# Patient Record
Sex: Female | Born: 1980 | Race: Black or African American | Hispanic: No | Marital: Single | Smoking: Never smoker
Health system: Southern US, Community
[De-identification: ages and names within clinical notes are randomized; demographics above are authoritative.]

## PROBLEM LIST (undated history)

## (undated) ENCOUNTER — Inpatient Hospital Stay (HOSPITAL_COMMUNITY): Payer: Self-pay

## (undated) ENCOUNTER — Emergency Department (HOSPITAL_COMMUNITY): Admission: EM | Payer: Medicaid Other

## (undated) DIAGNOSIS — O210 Mild hyperemesis gravidarum: Secondary | ICD-10-CM

## (undated) DIAGNOSIS — N189 Chronic kidney disease, unspecified: Secondary | ICD-10-CM

## (undated) DIAGNOSIS — I10 Essential (primary) hypertension: Secondary | ICD-10-CM

## (undated) DIAGNOSIS — F32A Depression, unspecified: Secondary | ICD-10-CM

## (undated) DIAGNOSIS — F419 Anxiety disorder, unspecified: Secondary | ICD-10-CM

## (undated) DIAGNOSIS — F329 Major depressive disorder, single episode, unspecified: Secondary | ICD-10-CM

## (undated) DIAGNOSIS — F99 Mental disorder, not otherwise specified: Secondary | ICD-10-CM

## (undated) HISTORY — PX: NO PAST SURGERIES: SHX2092

## (undated) HISTORY — DX: Chronic kidney disease, unspecified: N18.9

---
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
## 1998-05-04 ENCOUNTER — Emergency Department (HOSPITAL_COMMUNITY): Admission: EM | Admit: 1998-05-04 | Discharge: 1998-05-04 | Payer: Self-pay | Admitting: Emergency Medicine

## 1998-05-04 ENCOUNTER — Encounter: Payer: Self-pay | Admitting: Emergency Medicine

## 1998-08-31 ENCOUNTER — Emergency Department (HOSPITAL_COMMUNITY): Admission: EM | Admit: 1998-08-31 | Discharge: 1998-08-31 | Payer: Self-pay | Admitting: Emergency Medicine

## 1998-12-25 ENCOUNTER — Emergency Department (HOSPITAL_COMMUNITY): Admission: EM | Admit: 1998-12-25 | Discharge: 1998-12-25 | Payer: Self-pay | Admitting: Emergency Medicine

## 1999-05-01 ENCOUNTER — Emergency Department (HOSPITAL_COMMUNITY): Admission: EM | Admit: 1999-05-01 | Discharge: 1999-05-01 | Payer: Self-pay | Admitting: Emergency Medicine

## 1999-05-01 ENCOUNTER — Encounter: Payer: Self-pay | Admitting: Emergency Medicine

## 2000-06-21 ENCOUNTER — Emergency Department (HOSPITAL_COMMUNITY): Admission: EM | Admit: 2000-06-21 | Discharge: 2000-06-22 | Payer: Self-pay | Admitting: *Deleted

## 2000-09-01 ENCOUNTER — Emergency Department (HOSPITAL_COMMUNITY): Admission: EM | Admit: 2000-09-01 | Discharge: 2000-09-01 | Payer: Self-pay | Admitting: *Deleted

## 2000-12-07 ENCOUNTER — Emergency Department (HOSPITAL_COMMUNITY): Admission: EM | Admit: 2000-12-07 | Discharge: 2000-12-07 | Payer: Self-pay | Admitting: Emergency Medicine

## 2000-12-30 ENCOUNTER — Emergency Department (HOSPITAL_COMMUNITY): Admission: EM | Admit: 2000-12-30 | Discharge: 2000-12-30 | Payer: Self-pay

## 2001-02-01 ENCOUNTER — Emergency Department (HOSPITAL_COMMUNITY): Admission: EM | Admit: 2001-02-01 | Discharge: 2001-02-01 | Payer: Self-pay | Admitting: Emergency Medicine

## 2001-03-22 ENCOUNTER — Encounter: Payer: Self-pay | Admitting: Emergency Medicine

## 2001-03-22 ENCOUNTER — Emergency Department (HOSPITAL_COMMUNITY): Admission: EM | Admit: 2001-03-22 | Discharge: 2001-03-22 | Payer: Self-pay | Admitting: Emergency Medicine

## 2001-06-20 ENCOUNTER — Encounter: Payer: Self-pay | Admitting: Emergency Medicine

## 2001-06-20 ENCOUNTER — Emergency Department (HOSPITAL_COMMUNITY): Admission: EM | Admit: 2001-06-20 | Discharge: 2001-06-20 | Payer: Self-pay | Admitting: Emergency Medicine

## 2001-10-11 ENCOUNTER — Emergency Department (HOSPITAL_COMMUNITY): Admission: EM | Admit: 2001-10-11 | Discharge: 2001-10-11 | Payer: Self-pay | Admitting: Emergency Medicine

## 2001-10-17 ENCOUNTER — Emergency Department (HOSPITAL_COMMUNITY): Admission: EM | Admit: 2001-10-17 | Discharge: 2001-10-17 | Payer: Self-pay | Admitting: *Deleted

## 2001-11-09 ENCOUNTER — Emergency Department (HOSPITAL_COMMUNITY): Admission: EM | Admit: 2001-11-09 | Discharge: 2001-11-10 | Payer: Self-pay | Admitting: Emergency Medicine

## 2001-12-05 ENCOUNTER — Emergency Department (HOSPITAL_COMMUNITY): Admission: EM | Admit: 2001-12-05 | Discharge: 2001-12-05 | Payer: Self-pay | Admitting: Emergency Medicine

## 2001-12-27 ENCOUNTER — Encounter: Payer: Self-pay | Admitting: Emergency Medicine

## 2001-12-27 ENCOUNTER — Emergency Department (HOSPITAL_COMMUNITY): Admission: EM | Admit: 2001-12-27 | Discharge: 2001-12-28 | Payer: Self-pay | Admitting: Emergency Medicine

## 2002-05-10 ENCOUNTER — Inpatient Hospital Stay (HOSPITAL_COMMUNITY): Admission: EM | Admit: 2002-05-10 | Discharge: 2002-05-11 | Payer: Self-pay | Admitting: Emergency Medicine

## 2002-05-31 ENCOUNTER — Emergency Department (HOSPITAL_COMMUNITY): Admission: EM | Admit: 2002-05-31 | Discharge: 2002-06-01 | Payer: Self-pay | Admitting: Emergency Medicine

## 2002-06-05 ENCOUNTER — Inpatient Hospital Stay (HOSPITAL_COMMUNITY): Admission: EM | Admit: 2002-06-05 | Discharge: 2002-06-06 | Payer: Self-pay | Admitting: Emergency Medicine

## 2002-11-01 ENCOUNTER — Emergency Department (HOSPITAL_COMMUNITY): Admission: EM | Admit: 2002-11-01 | Discharge: 2002-11-01 | Payer: Self-pay | Admitting: Emergency Medicine

## 2002-11-01 ENCOUNTER — Encounter: Payer: Self-pay | Admitting: Emergency Medicine

## 2002-11-03 ENCOUNTER — Ambulatory Visit (HOSPITAL_COMMUNITY): Admission: AD | Admit: 2002-11-03 | Discharge: 2002-11-03 | Payer: Self-pay | Admitting: Family Medicine

## 2002-12-25 ENCOUNTER — Emergency Department (HOSPITAL_COMMUNITY): Admission: EM | Admit: 2002-12-25 | Discharge: 2002-12-26 | Payer: Self-pay | Admitting: Emergency Medicine

## 2003-01-01 ENCOUNTER — Emergency Department (HOSPITAL_COMMUNITY): Admission: EM | Admit: 2003-01-01 | Discharge: 2003-01-02 | Payer: Self-pay | Admitting: Emergency Medicine

## 2003-01-14 ENCOUNTER — Inpatient Hospital Stay (HOSPITAL_COMMUNITY): Admission: EM | Admit: 2003-01-14 | Discharge: 2003-01-15 | Payer: Self-pay | Admitting: Emergency Medicine

## 2003-02-05 ENCOUNTER — Inpatient Hospital Stay (HOSPITAL_COMMUNITY): Admission: EM | Admit: 2003-02-05 | Discharge: 2003-02-07 | Payer: Self-pay | Admitting: Emergency Medicine

## 2003-03-03 ENCOUNTER — Emergency Department (HOSPITAL_COMMUNITY): Admission: EM | Admit: 2003-03-03 | Discharge: 2003-03-03 | Payer: Self-pay | Admitting: Emergency Medicine

## 2003-03-30 ENCOUNTER — Inpatient Hospital Stay (HOSPITAL_COMMUNITY): Admission: AD | Admit: 2003-03-30 | Discharge: 2003-03-31 | Payer: Self-pay | Admitting: Obstetrics & Gynecology

## 2003-04-03 ENCOUNTER — Inpatient Hospital Stay (HOSPITAL_COMMUNITY): Admission: AD | Admit: 2003-04-03 | Discharge: 2003-04-03 | Payer: Self-pay | Admitting: Obstetrics and Gynecology

## 2003-05-06 ENCOUNTER — Inpatient Hospital Stay (HOSPITAL_COMMUNITY): Admission: AD | Admit: 2003-05-06 | Discharge: 2003-05-06 | Payer: Self-pay | Admitting: *Deleted

## 2003-05-20 ENCOUNTER — Inpatient Hospital Stay (HOSPITAL_COMMUNITY): Admission: AD | Admit: 2003-05-20 | Discharge: 2003-05-20 | Payer: Self-pay | Admitting: Obstetrics and Gynecology

## 2003-06-18 ENCOUNTER — Emergency Department (HOSPITAL_COMMUNITY): Admission: EM | Admit: 2003-06-18 | Discharge: 2003-06-18 | Payer: Self-pay | Admitting: Emergency Medicine

## 2003-07-24 ENCOUNTER — Emergency Department (HOSPITAL_COMMUNITY): Admission: EM | Admit: 2003-07-24 | Discharge: 2003-07-24 | Payer: Self-pay | Admitting: Emergency Medicine

## 2003-08-19 ENCOUNTER — Inpatient Hospital Stay (HOSPITAL_COMMUNITY): Admission: EM | Admit: 2003-08-19 | Discharge: 2003-08-22 | Payer: Self-pay | Admitting: Emergency Medicine

## 2003-09-27 ENCOUNTER — Emergency Department (HOSPITAL_COMMUNITY): Admission: EM | Admit: 2003-09-27 | Discharge: 2003-09-27 | Payer: Self-pay | Admitting: Emergency Medicine

## 2003-10-09 ENCOUNTER — Observation Stay (HOSPITAL_COMMUNITY): Admission: EM | Admit: 2003-10-09 | Discharge: 2003-10-09 | Payer: Self-pay | Admitting: Emergency Medicine

## 2003-10-09 ENCOUNTER — Ambulatory Visit: Payer: Self-pay | Admitting: Family Medicine

## 2003-10-15 ENCOUNTER — Emergency Department (HOSPITAL_COMMUNITY): Admission: EM | Admit: 2003-10-15 | Discharge: 2003-10-15 | Payer: Self-pay | Admitting: *Deleted

## 2004-05-30 ENCOUNTER — Emergency Department (HOSPITAL_COMMUNITY): Admission: EM | Admit: 2004-05-30 | Discharge: 2004-05-30 | Payer: Self-pay | Admitting: Emergency Medicine

## 2004-10-20 ENCOUNTER — Emergency Department (HOSPITAL_COMMUNITY): Admission: EM | Admit: 2004-10-20 | Discharge: 2004-10-21 | Payer: Self-pay | Admitting: Emergency Medicine

## 2004-10-27 ENCOUNTER — Ambulatory Visit: Payer: Self-pay | Admitting: Family Medicine

## 2004-11-23 ENCOUNTER — Ambulatory Visit: Payer: Self-pay | Admitting: *Deleted

## 2004-12-07 ENCOUNTER — Emergency Department (HOSPITAL_COMMUNITY): Admission: EM | Admit: 2004-12-07 | Discharge: 2004-12-07 | Payer: Self-pay | Admitting: Emergency Medicine

## 2005-03-11 ENCOUNTER — Ambulatory Visit: Payer: Self-pay | Admitting: Family Medicine

## 2005-03-15 ENCOUNTER — Ambulatory Visit: Payer: Self-pay | Admitting: Family Medicine

## 2005-04-02 ENCOUNTER — Ambulatory Visit: Payer: Self-pay | Admitting: Family Medicine

## 2005-05-22 ENCOUNTER — Emergency Department (HOSPITAL_COMMUNITY): Admission: EM | Admit: 2005-05-22 | Discharge: 2005-05-22 | Payer: Self-pay | Admitting: *Deleted

## 2005-07-13 ENCOUNTER — Emergency Department (HOSPITAL_COMMUNITY): Admission: EM | Admit: 2005-07-13 | Discharge: 2005-07-14 | Payer: Self-pay | Admitting: Emergency Medicine

## 2005-09-06 ENCOUNTER — Emergency Department (HOSPITAL_COMMUNITY): Admission: EM | Admit: 2005-09-06 | Discharge: 2005-09-06 | Payer: Self-pay | Admitting: Emergency Medicine

## 2005-10-04 ENCOUNTER — Observation Stay (HOSPITAL_COMMUNITY): Admission: EM | Admit: 2005-10-04 | Discharge: 2005-10-05 | Payer: Self-pay | Admitting: Emergency Medicine

## 2005-10-12 ENCOUNTER — Emergency Department (HOSPITAL_COMMUNITY): Admission: EM | Admit: 2005-10-12 | Discharge: 2005-10-12 | Payer: Self-pay | Admitting: Emergency Medicine

## 2005-10-13 ENCOUNTER — Ambulatory Visit: Payer: Self-pay | Admitting: Family Medicine

## 2006-04-03 ENCOUNTER — Emergency Department (HOSPITAL_COMMUNITY): Admission: EM | Admit: 2006-04-03 | Discharge: 2006-04-03 | Payer: Self-pay | Admitting: Emergency Medicine

## 2006-04-26 ENCOUNTER — Emergency Department (HOSPITAL_COMMUNITY): Admission: EM | Admit: 2006-04-26 | Discharge: 2006-04-26 | Payer: Self-pay | Admitting: Emergency Medicine

## 2006-07-31 ENCOUNTER — Emergency Department (HOSPITAL_COMMUNITY): Admission: EM | Admit: 2006-07-31 | Discharge: 2006-07-31 | Payer: Self-pay | Admitting: Emergency Medicine

## 2006-10-05 ENCOUNTER — Encounter (INDEPENDENT_AMBULATORY_CARE_PROVIDER_SITE_OTHER): Payer: Self-pay | Admitting: *Deleted

## 2007-05-12 ENCOUNTER — Emergency Department (HOSPITAL_COMMUNITY): Admission: EM | Admit: 2007-05-12 | Discharge: 2007-05-12 | Payer: Self-pay | Admitting: Emergency Medicine

## 2007-05-23 ENCOUNTER — Emergency Department (HOSPITAL_COMMUNITY): Admission: EM | Admit: 2007-05-23 | Discharge: 2007-05-23 | Payer: Self-pay | Admitting: Emergency Medicine

## 2007-10-10 ENCOUNTER — Emergency Department (HOSPITAL_COMMUNITY): Admission: EM | Admit: 2007-10-10 | Discharge: 2007-10-10 | Payer: Self-pay | Admitting: Emergency Medicine

## 2007-12-10 ENCOUNTER — Emergency Department (HOSPITAL_COMMUNITY): Admission: EM | Admit: 2007-12-10 | Discharge: 2007-12-10 | Payer: Self-pay | Admitting: Emergency Medicine

## 2008-01-22 ENCOUNTER — Emergency Department (HOSPITAL_COMMUNITY): Admission: EM | Admit: 2008-01-22 | Discharge: 2008-01-22 | Payer: Self-pay | Admitting: Emergency Medicine

## 2008-02-10 ENCOUNTER — Emergency Department (HOSPITAL_COMMUNITY): Admission: EM | Admit: 2008-02-10 | Discharge: 2008-02-10 | Payer: Self-pay | Admitting: Emergency Medicine

## 2008-02-16 ENCOUNTER — Observation Stay (HOSPITAL_COMMUNITY): Admission: EM | Admit: 2008-02-16 | Discharge: 2008-02-16 | Payer: Self-pay | Admitting: Emergency Medicine

## 2008-03-11 ENCOUNTER — Emergency Department (HOSPITAL_COMMUNITY): Admission: EM | Admit: 2008-03-11 | Discharge: 2008-03-11 | Payer: Self-pay | Admitting: Emergency Medicine

## 2008-04-01 ENCOUNTER — Observation Stay (HOSPITAL_COMMUNITY): Admission: EM | Admit: 2008-04-01 | Discharge: 2008-04-03 | Payer: Self-pay | Admitting: Emergency Medicine

## 2008-04-08 ENCOUNTER — Emergency Department (HOSPITAL_COMMUNITY): Admission: EM | Admit: 2008-04-08 | Discharge: 2008-04-09 | Payer: Self-pay | Admitting: Emergency Medicine

## 2008-04-27 ENCOUNTER — Emergency Department (HOSPITAL_COMMUNITY): Admission: EM | Admit: 2008-04-27 | Discharge: 2008-04-27 | Payer: Self-pay | Admitting: Emergency Medicine

## 2008-05-05 ENCOUNTER — Emergency Department (HOSPITAL_COMMUNITY): Admission: EM | Admit: 2008-05-05 | Discharge: 2008-05-05 | Payer: Self-pay | Admitting: Emergency Medicine

## 2008-06-01 ENCOUNTER — Observation Stay (HOSPITAL_COMMUNITY): Admission: EM | Admit: 2008-06-01 | Discharge: 2008-06-01 | Payer: Self-pay | Admitting: Emergency Medicine

## 2008-07-14 ENCOUNTER — Emergency Department (HOSPITAL_COMMUNITY): Admission: EM | Admit: 2008-07-14 | Discharge: 2008-07-14 | Payer: Self-pay | Admitting: Emergency Medicine

## 2008-08-04 ENCOUNTER — Emergency Department (HOSPITAL_COMMUNITY): Admission: EM | Admit: 2008-08-04 | Discharge: 2008-08-04 | Payer: Self-pay | Admitting: Emergency Medicine

## 2008-08-09 ENCOUNTER — Emergency Department (HOSPITAL_COMMUNITY): Admission: EM | Admit: 2008-08-09 | Discharge: 2008-08-09 | Payer: Self-pay | Admitting: Emergency Medicine

## 2008-08-12 ENCOUNTER — Emergency Department (HOSPITAL_COMMUNITY): Admission: EM | Admit: 2008-08-12 | Discharge: 2008-08-12 | Payer: Self-pay | Admitting: Emergency Medicine

## 2008-09-23 ENCOUNTER — Emergency Department (HOSPITAL_COMMUNITY): Admission: EM | Admit: 2008-09-23 | Discharge: 2008-09-23 | Payer: Self-pay | Admitting: Emergency Medicine

## 2008-10-22 ENCOUNTER — Encounter (INDEPENDENT_AMBULATORY_CARE_PROVIDER_SITE_OTHER): Payer: Self-pay | Admitting: Adult Health

## 2008-10-22 ENCOUNTER — Ambulatory Visit: Payer: Self-pay | Admitting: Internal Medicine

## 2008-10-22 LAB — CONVERTED CEMR LAB
ALT: 10 units/L (ref 0–35)
AST: 8 units/L (ref 0–37)
Albumin: 3.6 g/dL (ref 3.5–5.2)
Alkaline Phosphatase: 67 units/L (ref 39–117)
BUN: 19 mg/dL (ref 6–23)
Basophils Absolute: 0.1 10*3/uL (ref 0.0–0.1)
Basophils Relative: 1 % (ref 0–1)
CO2: 20 meq/L (ref 19–32)
Calcium: 9 mg/dL (ref 8.4–10.5)
Chloride: 104 meq/L (ref 96–112)
Creatinine, Ser: 0.91 mg/dL (ref 0.40–1.20)
Eosinophils Absolute: 0.5 10*3/uL (ref 0.0–0.7)
Eosinophils Relative: 5 % (ref 0–5)
Glucose, Bld: 250 mg/dL — ABNORMAL HIGH (ref 70–99)
HCT: 31.4 % — ABNORMAL LOW (ref 36.0–46.0)
Hemoglobin: 10 g/dL — ABNORMAL LOW (ref 12.0–15.0)
Lymphocytes Relative: 40 % (ref 12–46)
Lymphs Abs: 3.5 10*3/uL (ref 0.7–4.0)
MCHC: 31.8 g/dL (ref 30.0–36.0)
MCV: 78.3 fL (ref 78.0–100.0)
Monocytes Absolute: 0.5 10*3/uL (ref 0.1–1.0)
Monocytes Relative: 6 % (ref 3–12)
Neutro Abs: 4.2 10*3/uL (ref 1.7–7.7)
Neutrophils Relative %: 48 % (ref 43–77)
Platelets: 283 10*3/uL (ref 150–400)
Potassium: 4.4 meq/L (ref 3.5–5.3)
RBC: 4.01 M/uL (ref 3.87–5.11)
RDW: 13.4 % (ref 11.5–15.5)
Sodium: 137 meq/L (ref 135–145)
Total Bilirubin: 0.2 mg/dL — ABNORMAL LOW (ref 0.3–1.2)
Total Protein: 7.1 g/dL (ref 6.0–8.3)
WBC: 8.7 10*3/uL (ref 4.0–10.5)

## 2008-10-23 ENCOUNTER — Encounter (INDEPENDENT_AMBULATORY_CARE_PROVIDER_SITE_OTHER): Payer: Self-pay | Admitting: Adult Health

## 2008-10-23 LAB — CONVERTED CEMR LAB: Microalb, Ur: 7.32 mg/dL — ABNORMAL HIGH (ref 0.00–1.89)

## 2008-11-16 ENCOUNTER — Emergency Department (HOSPITAL_COMMUNITY): Admission: EM | Admit: 2008-11-16 | Discharge: 2008-11-16 | Payer: Self-pay | Admitting: Emergency Medicine

## 2008-12-15 ENCOUNTER — Emergency Department (HOSPITAL_COMMUNITY): Admission: EM | Admit: 2008-12-15 | Discharge: 2008-12-15 | Payer: Self-pay | Admitting: Emergency Medicine

## 2009-01-06 ENCOUNTER — Emergency Department (HOSPITAL_COMMUNITY): Admission: EM | Admit: 2009-01-06 | Discharge: 2009-01-06 | Payer: Self-pay | Admitting: Emergency Medicine

## 2009-01-16 ENCOUNTER — Emergency Department (HOSPITAL_COMMUNITY): Admission: EM | Admit: 2009-01-16 | Discharge: 2009-01-16 | Payer: Self-pay | Admitting: Emergency Medicine

## 2009-03-08 ENCOUNTER — Emergency Department (HOSPITAL_COMMUNITY): Admission: EM | Admit: 2009-03-08 | Discharge: 2009-03-09 | Payer: Self-pay | Admitting: Emergency Medicine

## 2009-03-27 ENCOUNTER — Emergency Department (HOSPITAL_COMMUNITY): Admission: EM | Admit: 2009-03-27 | Discharge: 2009-03-27 | Payer: Self-pay | Admitting: Emergency Medicine

## 2009-05-28 ENCOUNTER — Emergency Department (HOSPITAL_COMMUNITY): Admission: EM | Admit: 2009-05-28 | Discharge: 2009-05-29 | Payer: Self-pay | Admitting: Emergency Medicine

## 2009-06-10 ENCOUNTER — Emergency Department (HOSPITAL_COMMUNITY): Admission: EM | Admit: 2009-06-10 | Discharge: 2009-06-10 | Payer: Self-pay | Admitting: Emergency Medicine

## 2009-06-11 ENCOUNTER — Ambulatory Visit: Payer: Self-pay | Admitting: Internal Medicine

## 2009-09-22 ENCOUNTER — Emergency Department (HOSPITAL_COMMUNITY): Admission: EM | Admit: 2009-09-22 | Discharge: 2009-09-22 | Payer: Self-pay | Admitting: Emergency Medicine

## 2009-10-23 ENCOUNTER — Emergency Department (HOSPITAL_COMMUNITY): Admission: EM | Admit: 2009-10-23 | Discharge: 2009-10-23 | Payer: Self-pay | Admitting: Emergency Medicine

## 2009-11-13 ENCOUNTER — Emergency Department (HOSPITAL_COMMUNITY): Admission: EM | Admit: 2009-11-13 | Discharge: 2009-11-13 | Payer: Self-pay | Admitting: Emergency Medicine

## 2009-11-14 ENCOUNTER — Emergency Department (HOSPITAL_COMMUNITY): Admission: EM | Admit: 2009-11-14 | Discharge: 2009-11-14 | Payer: Self-pay | Admitting: Emergency Medicine

## 2010-02-05 ENCOUNTER — Emergency Department (HOSPITAL_COMMUNITY)
Admission: EM | Admit: 2010-02-05 | Discharge: 2010-02-05 | Payer: Self-pay | Source: Home / Self Care | Admitting: Emergency Medicine

## 2010-04-01 LAB — BASIC METABOLIC PANEL
BUN: 10 mg/dL (ref 6–23)
Calcium: 8.5 mg/dL (ref 8.4–10.5)
Calcium: 9.1 mg/dL (ref 8.4–10.5)
GFR calc Af Amer: >60 mL/min (ref >60)
GFR calc non Af Amer: >60 mL/min (ref >60)
GFR calc non Af Amer: >60 mL/min (ref >60)
Glucose, Bld: 333 mg/dL — ABNORMAL HIGH (ref 70–99)
Potassium: 3.5 mEq/L (ref 3.5–5.1)
Sodium: 135 mEq/L (ref 135–145)
Sodium: 133 mEq/L — ABNORMAL LOW (ref 135–145)

## 2010-04-01 LAB — CBC
HCT: 33.2 % — ABNORMAL LOW (ref 36.0–46.0)
Hemoglobin: 10.2 g/dL — ABNORMAL LOW (ref 12.0–15.0)
Hemoglobin: 11 g/dL — ABNORMAL LOW (ref 12.0–15.0)
MCHC: 32.6 g/dL (ref 30.0–36.0)
RDW: 13.3 % (ref 11.5–15.5)
WBC: 7.4 10*3/uL (ref 4.0–10.5)

## 2010-04-01 LAB — URINALYSIS, ROUTINE W REFLEX MICROSCOPIC
Glucose, UA: 100 mg/dL — AB
Hgb urine dipstick: NEGATIVE
Leukocytes, UA: NEGATIVE
Nitrite: NEGATIVE
Specific Gravity, Urine: 1.035 — ABNORMAL HIGH (ref 1.005–1.030)
Urobilinogen, UA: 0.2 mg/dL (ref 0.0–1.0)
pH: 6 (ref 5.0–8.0)

## 2010-04-01 LAB — POCT PREGNANCY, URINE: Preg Test, Ur: NEGATIVE

## 2010-04-01 LAB — GLUCOSE, CAPILLARY
Glucose-Capillary: 316 mg/dL — ABNORMAL HIGH (ref 70–99)
Glucose-Capillary: 241 mg/dL — ABNORMAL HIGH (ref 70–99)

## 2010-04-01 LAB — URINE MICROSCOPIC-ADD ON

## 2010-04-01 LAB — DIFFERENTIAL
Basophils Absolute: 0 10*3/uL (ref 0.0–0.1)
Basophils Absolute: 0 10*3/uL (ref 0.0–0.1)
Basophils Relative: 0 % (ref 0–1)
Lymphocytes Relative: 42 % (ref 12–46)
Monocytes Absolute: 0.5 10*3/uL (ref 0.1–1.0)
Monocytes Relative: 6 % (ref 3–12)
Neutro Abs: 4.5 10*3/uL (ref 1.7–7.7)
Neutro Abs: 3.4 10*3/uL (ref 1.7–7.7)
Neutrophils Relative %: 58 % (ref 43–77)
Neutrophils Relative %: 46 % (ref 43–77)

## 2010-04-02 LAB — DIFFERENTIAL
Basophils Absolute: 0 10*3/uL (ref 0.0–0.1)
Eosinophils Absolute: 0.3 10*3/uL (ref 0.0–0.7)
Eosinophils Relative: 4 % (ref 0–5)
Monocytes Absolute: 0.6 10*3/uL (ref 0.1–1.0)

## 2010-04-02 LAB — LIPASE, BLOOD: Lipase: 54 U/L (ref 11–59)

## 2010-04-02 LAB — URINALYSIS, ROUTINE W REFLEX MICROSCOPIC
Glucose, UA: 500 mg/dL — AB
Nitrite: NEGATIVE
Protein, ur: NEGATIVE mg/dL
Urobilinogen, UA: 0.2 mg/dL (ref 0.0–1.0)

## 2010-04-02 LAB — GLUCOSE, CAPILLARY: Glucose-Capillary: 262 mg/dL — ABNORMAL HIGH (ref 70–99)

## 2010-04-02 LAB — POCT I-STAT, CHEM 8
Calcium, Ion: 1.2 mmol/L (ref 1.12–1.32)
Glucose, Bld: 253 mg/dL — ABNORMAL HIGH (ref 70–99)
HCT: 36 % (ref 36.0–46.0)
TCO2: 23 mmol/L (ref 0–100)

## 2010-04-02 LAB — POCT PREGNANCY, URINE: Preg Test, Ur: NEGATIVE

## 2010-04-02 LAB — CBC
HCT: 32.7 % — ABNORMAL LOW (ref 36.0–46.0)
MCH: 25.4 pg — ABNORMAL LOW (ref 26.0–34.0)
MCHC: 32.7 g/dL (ref 30.0–36.0)
MCV: 77.7 fL — ABNORMAL LOW (ref 78.0–100.0)
Platelets: 263 10*3/uL (ref 150–400)
RDW: 13.6 % (ref 11.5–15.5)
WBC: 7.2 10*3/uL (ref 4.0–10.5)

## 2010-04-06 LAB — GLUCOSE, CAPILLARY

## 2010-04-07 LAB — DIFFERENTIAL
Basophils Absolute: 0.1 10*3/uL (ref 0.0–0.1)
Basophils Relative: 1 % (ref 0–1)
Eosinophils Relative: 4 % (ref 0–5)
Monocytes Absolute: 0.7 10*3/uL (ref 0.1–1.0)

## 2010-04-07 LAB — BASIC METABOLIC PANEL
CO2: 29 mEq/L (ref 19–32)
Glucose, Bld: 294 mg/dL — ABNORMAL HIGH (ref 70–99)
Potassium: 4.4 mEq/L (ref 3.5–5.1)
Sodium: 138 mEq/L (ref 135–145)

## 2010-04-07 LAB — URINE CULTURE: Colony Count: 35000

## 2010-04-07 LAB — CBC
HCT: 34 % — ABNORMAL LOW (ref 36.0–46.0)
Hemoglobin: 11 g/dL — ABNORMAL LOW (ref 12.0–15.0)
MCHC: 32.5 g/dL (ref 30.0–36.0)
RDW: 13.2 % (ref 11.5–15.5)

## 2010-04-07 LAB — GLUCOSE, CAPILLARY: Glucose-Capillary: 362 mg/dL — ABNORMAL HIGH (ref 70–99)

## 2010-04-08 LAB — URINALYSIS, ROUTINE W REFLEX MICROSCOPIC
Bilirubin Urine: NEGATIVE
Glucose, UA: >1000 mg/dL — AB
Ketones, ur: NEGATIVE mg/dL
Leukocytes, UA: NEGATIVE
pH: 6 (ref 5.0–8.0)

## 2010-04-08 LAB — CBC
Hemoglobin: 10.9 g/dL — ABNORMAL LOW (ref 12.0–15.0)
Platelets: 187 10*3/uL (ref 150–400)
RDW: 14.1 % (ref 11.5–15.5)

## 2010-04-08 LAB — BASIC METABOLIC PANEL
Calcium: 8.8 mg/dL (ref 8.4–10.5)
Creatinine, Ser: 0.76 mg/dL (ref 0.4–1.2)
GFR calc non Af Amer: >60 mL/min (ref >60)
Glucose, Bld: 295 mg/dL — ABNORMAL HIGH (ref 70–99)
Sodium: 139 mEq/L (ref 135–145)

## 2010-04-08 LAB — DIFFERENTIAL
Basophils Absolute: 0 10*3/uL (ref 0.0–0.1)
Lymphocytes Relative: 52 % — ABNORMAL HIGH (ref 12–46)
Monocytes Absolute: 0.5 10*3/uL (ref 0.1–1.0)
Neutro Abs: 2.6 10*3/uL (ref 1.7–7.7)
Neutrophils Relative %: 38 % — ABNORMAL LOW (ref 43–77)

## 2010-04-08 LAB — GLUCOSE, CAPILLARY: Glucose-Capillary: 118 mg/dL — ABNORMAL HIGH (ref 70–99)

## 2010-04-08 LAB — URINE MICROSCOPIC-ADD ON

## 2010-04-10 LAB — POCT I-STAT 3, VENOUS BLOOD GAS (G3P V)
Acid-Base Excess: 2 mmol/L (ref 0.0–2.0)
Bicarbonate: 28.6 mEq/L — ABNORMAL HIGH (ref 20.0–24.0)
O2 Saturation: 37 %
pO2, Ven: 23 mmHg — CL (ref 30.0–45.0)

## 2010-04-10 LAB — DIFFERENTIAL
Basophils Absolute: 0 10*3/uL (ref 0.0–0.1)
Basophils Relative: 1 % (ref 0–1)
Eosinophils Absolute: 0.2 10*3/uL (ref 0.0–0.7)
Eosinophils Relative: 3 % (ref 0–5)
Lymphocytes Relative: 46 % (ref 12–46)
Lymphs Abs: 2.8 K/uL (ref 0.7–4.0)
Monocytes Absolute: 0.5 K/uL (ref 0.1–1.0)
Monocytes Relative: 8 % (ref 3–12)
Neutro Abs: 2.7 10*3/uL (ref 1.7–7.7)
Neutrophils Relative %: 44 % (ref 43–77)

## 2010-04-10 LAB — CBC
HCT: 35.9 % — ABNORMAL LOW (ref 36.0–46.0)
Hemoglobin: 11.8 g/dL — ABNORMAL LOW (ref 12.0–15.0)
MCHC: 32.8 g/dL (ref 30.0–36.0)
MCV: 78.2 fL (ref 78.0–100.0)
Platelets: 251 K/uL (ref 150–400)
RBC: 4.58 MIL/uL (ref 3.87–5.11)
RDW: 13.5 % (ref 11.5–15.5)
WBC: 6.2 K/uL (ref 4.0–10.5)

## 2010-04-10 LAB — POCT I-STAT, CHEM 8
Calcium, Ion: 1.24 mmol/L (ref 1.12–1.32)
Chloride: 104 mEq/L (ref 96–112)
Creatinine, Ser: 0.9 mg/dL (ref 0.4–1.2)
Glucose, Bld: 329 mg/dL — ABNORMAL HIGH (ref 70–99)
HCT: 38 % (ref 36.0–46.0)

## 2010-04-10 LAB — GLUCOSE, CAPILLARY

## 2010-04-10 LAB — URINALYSIS, ROUTINE W REFLEX MICROSCOPIC
Bilirubin Urine: NEGATIVE
Glucose, UA: >1000 mg/dL — AB
Hgb urine dipstick: NEGATIVE
Ketones, ur: NEGATIVE mg/dL
Leukocytes, UA: NEGATIVE
Nitrite: NEGATIVE
Protein, ur: NEGATIVE mg/dL
Specific Gravity, Urine: 1.034 — ABNORMAL HIGH (ref 1.005–1.030)
Urobilinogen, UA: 0.2 mg/dL (ref 0.0–1.0)
pH: 6 (ref 5.0–8.0)

## 2010-04-10 LAB — URINE MICROSCOPIC-ADD ON

## 2010-04-20 LAB — URINALYSIS, ROUTINE W REFLEX MICROSCOPIC
Glucose, UA: >1000 mg/dL — AB
Hgb urine dipstick: NEGATIVE
Leukocytes, UA: NEGATIVE
Nitrite: NEGATIVE
Protein, ur: NEGATIVE mg/dL
Specific Gravity, Urine: 1.035 — ABNORMAL HIGH (ref 1.005–1.030)
Specific Gravity, Urine: 1.042 — ABNORMAL HIGH (ref 1.005–1.030)
Urobilinogen, UA: 0.2 mg/dL (ref 0.0–1.0)
pH: 6 (ref 5.0–8.0)

## 2010-04-20 LAB — COMPREHENSIVE METABOLIC PANEL
ALT: 13 U/L (ref 0–35)
AST: 16 U/L (ref 0–37)
Alkaline Phosphatase: 82 U/L (ref 39–117)
CO2: 25 mEq/L (ref 19–32)
GFR calc Af Amer: >60 mL/min (ref >60)
Glucose, Bld: 311 mg/dL — ABNORMAL HIGH (ref 70–99)
Potassium: 4.2 mEq/L (ref 3.5–5.1)
Sodium: 136 mEq/L (ref 135–145)
Total Protein: 8.4 g/dL — ABNORMAL HIGH (ref 6.0–8.3)

## 2010-04-20 LAB — DIFFERENTIAL
Basophils Absolute: 0 10*3/uL (ref 0.0–0.1)
Basophils Relative: 1 % (ref 0–1)
Basophils Relative: 1 % (ref 0–1)
Eosinophils Absolute: 0.2 10*3/uL (ref 0.0–0.7)
Eosinophils Relative: 2 % (ref 0–5)
Lymphs Abs: 2.9 10*3/uL (ref 0.7–4.0)
Monocytes Relative: 6 % (ref 3–12)
Neutro Abs: 3 10*3/uL (ref 1.7–7.7)
Neutrophils Relative %: 49 % (ref 43–77)
Neutrophils Relative %: 52 % (ref 43–77)

## 2010-04-20 LAB — BASIC METABOLIC PANEL
BUN: 11 mg/dL (ref 6–23)
CO2: 28 mEq/L (ref 19–32)
Calcium: 9.3 mg/dL (ref 8.4–10.5)
Creatinine, Ser: 0.87 mg/dL (ref 0.4–1.2)
GFR calc non Af Amer: >60 mL/min (ref >60)
Glucose, Bld: 318 mg/dL — ABNORMAL HIGH (ref 70–99)
Sodium: 135 mEq/L (ref 135–145)

## 2010-04-20 LAB — URINE MICROSCOPIC-ADD ON

## 2010-04-20 LAB — CBC
Hemoglobin: 11.4 g/dL — ABNORMAL LOW (ref 12.0–15.0)
Hemoglobin: 12.2 g/dL (ref 12.0–15.0)
MCHC: 33 g/dL (ref 30.0–36.0)
Platelets: 219 10*3/uL (ref 150–400)
RBC: 4.69 MIL/uL (ref 3.87–5.11)
RDW: 13.9 % (ref 11.5–15.5)
RDW: 14.1 % (ref 11.5–15.5)

## 2010-04-20 LAB — GLUCOSE, CAPILLARY
Glucose-Capillary: 227 mg/dL — ABNORMAL HIGH (ref 70–99)
Glucose-Capillary: 261 mg/dL — ABNORMAL HIGH (ref 70–99)
Glucose-Capillary: 299 mg/dL — ABNORMAL HIGH (ref 70–99)

## 2010-04-23 LAB — URINALYSIS, ROUTINE W REFLEX MICROSCOPIC
Bilirubin Urine: NEGATIVE
Glucose, UA: 100 mg/dL — AB
Hgb urine dipstick: NEGATIVE
Ketones, ur: NEGATIVE mg/dL
Nitrite: NEGATIVE
Specific Gravity, Urine: 1.026 (ref 1.005–1.030)
pH: 6 (ref 5.0–8.0)

## 2010-04-23 LAB — WET PREP, GENITAL
Trich, Wet Prep: NONE SEEN
Yeast Wet Prep HPF POC: NONE SEEN

## 2010-04-23 LAB — GC/CHLAMYDIA PROBE AMP, GENITAL: Chlamydia, DNA Probe: NEGATIVE

## 2010-04-23 LAB — GLUCOSE, CAPILLARY: Glucose-Capillary: 192 mg/dL — ABNORMAL HIGH (ref 70–99)

## 2010-04-24 LAB — URINALYSIS, ROUTINE W REFLEX MICROSCOPIC
Bilirubin Urine: NEGATIVE
Glucose, UA: 100 mg/dL — AB
Hgb urine dipstick: NEGATIVE
Protein, ur: NEGATIVE mg/dL
Urobilinogen, UA: 0.2 mg/dL (ref 0.0–1.0)

## 2010-04-24 LAB — RAPID URINE DRUG SCREEN, HOSP PERFORMED
Amphetamines: NOT DETECTED
Barbiturates: NOT DETECTED
Benzodiazepines: NOT DETECTED

## 2010-04-24 LAB — URINE MICROSCOPIC-ADD ON

## 2010-04-24 LAB — POCT I-STAT, CHEM 8
Chloride: 101 mEq/L (ref 96–112)
Creatinine, Ser: 0.7 mg/dL (ref 0.4–1.2)
HCT: 31 % — ABNORMAL LOW (ref 36.0–46.0)
Hemoglobin: 10.5 g/dL — ABNORMAL LOW (ref 12.0–15.0)
Potassium: 4.2 mEq/L (ref 3.5–5.1)
Sodium: 139 mEq/L (ref 135–145)

## 2010-04-24 LAB — GLUCOSE, CAPILLARY: Glucose-Capillary: 201 mg/dL — ABNORMAL HIGH (ref 70–99)

## 2010-04-26 LAB — POCT I-STAT, CHEM 8
BUN: 14 mg/dL (ref 6–23)
Calcium, Ion: 1.12 mmol/L (ref 1.12–1.32)
Calcium, Ion: 1.24 mmol/L (ref 1.12–1.32)
Creatinine, Ser: 1.1 mg/dL (ref 0.4–1.2)
HCT: 38 % (ref 36.0–46.0)
Hemoglobin: 12.2 g/dL (ref 12.0–15.0)
Hemoglobin: 12.9 g/dL (ref 12.0–15.0)
Sodium: 136 mEq/L (ref 135–145)
TCO2: 21 mmol/L (ref 0–100)
TCO2: 21 mmol/L (ref 0–100)

## 2010-04-26 LAB — GLUCOSE, CAPILLARY
Glucose-Capillary: 218 mg/dL — ABNORMAL HIGH (ref 70–99)
Glucose-Capillary: 338 mg/dL — ABNORMAL HIGH (ref 70–99)

## 2010-04-27 LAB — POCT I-STAT, CHEM 8
Calcium, Ion: 1.16 mmol/L (ref 1.12–1.32)
Chloride: 99 mEq/L (ref 96–112)
HCT: 38 % (ref 36.0–46.0)
Sodium: 134 mEq/L — ABNORMAL LOW (ref 135–145)

## 2010-04-27 LAB — GLUCOSE, CAPILLARY
Glucose-Capillary: 254 mg/dL — ABNORMAL HIGH (ref 70–99)
Glucose-Capillary: 376 mg/dL — ABNORMAL HIGH (ref 70–99)

## 2010-04-27 LAB — DIFFERENTIAL
Basophils Absolute: 0 10*3/uL (ref 0.0–0.1)
Basophils Relative: 1 % (ref 0–1)
Monocytes Absolute: 0.4 10*3/uL (ref 0.1–1.0)
Neutro Abs: 3.8 10*3/uL (ref 1.7–7.7)
Neutrophils Relative %: 55 % (ref 43–77)

## 2010-04-27 LAB — CBC
MCHC: 32.5 g/dL (ref 30.0–36.0)
Platelets: 252 10*3/uL (ref 150–400)
RDW: 13.4 % (ref 11.5–15.5)

## 2010-04-27 LAB — URINALYSIS, ROUTINE W REFLEX MICROSCOPIC
Bilirubin Urine: NEGATIVE
Ketones, ur: NEGATIVE mg/dL
Nitrite: NEGATIVE
Specific Gravity, Urine: 1.045 — ABNORMAL HIGH (ref 1.005–1.030)
Urobilinogen, UA: 0.2 mg/dL (ref 0.0–1.0)

## 2010-04-27 LAB — POCT PREGNANCY, URINE: Preg Test, Ur: NEGATIVE

## 2010-04-28 LAB — POCT I-STAT, CHEM 8
Calcium, Ion: 1.17 mmol/L (ref 1.12–1.32)
Glucose, Bld: 592 mg/dL (ref 70–99)
HCT: 42 % (ref 36.0–46.0)
Hemoglobin: 14.3 g/dL (ref 12.0–15.0)

## 2010-04-28 LAB — GLUCOSE, CAPILLARY: Glucose-Capillary: 153 mg/dL — ABNORMAL HIGH (ref 70–99)

## 2010-04-28 LAB — URINE MICROSCOPIC-ADD ON

## 2010-04-28 LAB — URINALYSIS, ROUTINE W REFLEX MICROSCOPIC
Nitrite: NEGATIVE
Specific Gravity, Urine: 1.038 — ABNORMAL HIGH (ref 1.005–1.030)
Urobilinogen, UA: 0.2 mg/dL (ref 0.0–1.0)

## 2010-04-29 LAB — URINALYSIS, ROUTINE W REFLEX MICROSCOPIC
Glucose, UA: >1000 mg/dL — AB
Glucose, UA: >1000 mg/dL — AB
Hgb urine dipstick: NEGATIVE
Hgb urine dipstick: NEGATIVE
Leukocytes, UA: NEGATIVE
Protein, ur: NEGATIVE mg/dL
Specific Gravity, Urine: 1.038 — ABNORMAL HIGH (ref 1.005–1.030)
pH: 6.5 (ref 5.0–8.0)

## 2010-04-29 LAB — CBC
HCT: 38.6 % (ref 36.0–46.0)
Hemoglobin: 12.8 g/dL (ref 12.0–15.0)
MCV: 79.3 fL (ref 78.0–100.0)
MCV: 79.3 fL (ref 78.0–100.0)
Platelets: 248 10*3/uL (ref 150–400)
RBC: 4.71 MIL/uL (ref 3.87–5.11)
RDW: 13.5 % (ref 11.5–15.5)
WBC: 6.6 10*3/uL (ref 4.0–10.5)
WBC: 6.3 10*3/uL (ref 4.0–10.5)

## 2010-04-29 LAB — BASIC METABOLIC PANEL
BUN: 9 mg/dL (ref 6–23)
Calcium: 9.4 mg/dL (ref 8.4–10.5)
Chloride: 96 mEq/L (ref 96–112)
Chloride: 96 mEq/L (ref 96–112)
Creatinine, Ser: 0.66 mg/dL (ref 0.4–1.2)
GFR calc Af Amer: >60 mL/min (ref >60)
GFR calc non Af Amer: >60 mL/min (ref >60)
Glucose, Bld: 409 mg/dL — ABNORMAL HIGH (ref 70–99)
Potassium: 4.3 mEq/L (ref 3.5–5.1)
Sodium: 132 mEq/L — ABNORMAL LOW (ref 135–145)

## 2010-04-29 LAB — DIFFERENTIAL
Eosinophils Absolute: 0.2 10*3/uL (ref 0.0–0.7)
Eosinophils Relative: 3 % (ref 0–5)
Lymphocytes Relative: 40 % (ref 12–46)
Lymphocytes Relative: 39 % (ref 12–46)
Lymphs Abs: 2.6 10*3/uL (ref 0.7–4.0)
Lymphs Abs: 2.4 10*3/uL (ref 0.7–4.0)
Monocytes Absolute: 0.3 10*3/uL (ref 0.1–1.0)
Monocytes Relative: 5 % (ref 3–12)
Neutro Abs: 3.2 10*3/uL (ref 1.7–7.7)
Neutrophils Relative %: 49 % (ref 43–77)

## 2010-04-29 LAB — URINE MICROSCOPIC-ADD ON

## 2010-04-29 LAB — POCT PREGNANCY, URINE: Preg Test, Ur: NEGATIVE

## 2010-04-29 LAB — GLUCOSE, CAPILLARY
Glucose-Capillary: 209 mg/dL — ABNORMAL HIGH (ref 70–99)
Glucose-Capillary: 338 mg/dL — ABNORMAL HIGH (ref 70–99)
Glucose-Capillary: 332 mg/dL — ABNORMAL HIGH (ref 70–99)

## 2010-04-29 LAB — RAPID URINE DRUG SCREEN, HOSP PERFORMED
Cocaine: NOT DETECTED
Opiates: NOT DETECTED

## 2010-04-30 LAB — URINALYSIS, ROUTINE W REFLEX MICROSCOPIC
Glucose, UA: >1000 mg/dL — AB
Glucose, UA: >1000 mg/dL — AB
Ketones, ur: 15 mg/dL — AB
Leukocytes, UA: NEGATIVE
Nitrite: NEGATIVE
Protein, ur: NEGATIVE mg/dL
Specific Gravity, Urine: 1.041 — ABNORMAL HIGH (ref 1.005–1.030)
pH: 6 (ref 5.0–8.0)

## 2010-04-30 LAB — BASIC METABOLIC PANEL
BUN: 4 mg/dL — ABNORMAL LOW (ref 6–23)
BUN: 6 mg/dL (ref 6–23)
BUN: 8 mg/dL (ref 6–23)
CO2: 24 mEq/L (ref 19–32)
CO2: 26 mEq/L (ref 19–32)
Calcium: 9 mg/dL (ref 8.4–10.5)
Calcium: 8.6 mg/dL (ref 8.4–10.5)
Chloride: 103 mEq/L (ref 96–112)
Chloride: 104 mEq/L (ref 96–112)
Chloride: 104 mEq/L (ref 96–112)
Creatinine, Ser: 0.71 mg/dL (ref 0.4–1.2)
GFR calc Af Amer: >60 mL/min (ref >60)
GFR calc non Af Amer: >60 mL/min (ref >60)
GFR calc non Af Amer: >60 mL/min (ref >60)
GFR calc non Af Amer: >60 mL/min (ref >60)
Glucose, Bld: 182 mg/dL — ABNORMAL HIGH (ref 70–99)
Glucose, Bld: 215 mg/dL — ABNORMAL HIGH (ref 70–99)
Glucose, Bld: 391 mg/dL — ABNORMAL HIGH (ref 70–99)
Potassium: 3.8 mEq/L (ref 3.5–5.1)
Potassium: 3.9 mEq/L (ref 3.5–5.1)
Potassium: 4 mEq/L (ref 3.5–5.1)
Sodium: 136 mEq/L (ref 135–145)
Sodium: 136 mEq/L (ref 135–145)
Sodium: 138 mEq/L (ref 135–145)

## 2010-04-30 LAB — CBC
HCT: 38.8 % (ref 36.0–46.0)
Hemoglobin: 10.8 g/dL — ABNORMAL LOW (ref 12.0–15.0)
Hemoglobin: 12.8 g/dL (ref 12.0–15.0)
Platelets: 232 10*3/uL (ref 150–400)
Platelets: 242 10*3/uL (ref 150–400)
RDW: 12.9 % (ref 11.5–15.5)
WBC: 7 10*3/uL (ref 4.0–10.5)
WBC: 5.6 10*3/uL (ref 4.0–10.5)

## 2010-04-30 LAB — GLUCOSE, CAPILLARY
Glucose-Capillary: 103 mg/dL — ABNORMAL HIGH (ref 70–99)
Glucose-Capillary: 186 mg/dL — ABNORMAL HIGH (ref 70–99)
Glucose-Capillary: 232 mg/dL — ABNORMAL HIGH (ref 70–99)
Glucose-Capillary: 383 mg/dL — ABNORMAL HIGH (ref 70–99)
Glucose-Capillary: 154 mg/dL — ABNORMAL HIGH (ref 70–99)
Glucose-Capillary: 279 mg/dL — ABNORMAL HIGH (ref 70–99)
Glucose-Capillary: 297 mg/dL — ABNORMAL HIGH (ref 70–99)
Glucose-Capillary: 304 mg/dL — ABNORMAL HIGH (ref 70–99)
Glucose-Capillary: 381 mg/dL — ABNORMAL HIGH (ref 70–99)

## 2010-04-30 LAB — URINE MICROSCOPIC-ADD ON

## 2010-04-30 LAB — RAPID URINE DRUG SCREEN, HOSP PERFORMED
Amphetamines: NOT DETECTED
Barbiturates: NOT DETECTED
Benzodiazepines: NOT DETECTED
Cocaine: NOT DETECTED

## 2010-04-30 LAB — COMPREHENSIVE METABOLIC PANEL
Albumin: 3.2 g/dL — ABNORMAL LOW (ref 3.5–5.2)
Alkaline Phosphatase: 81 U/L (ref 39–117)
BUN: 9 mg/dL (ref 6–23)
CO2: 27 mEq/L (ref 19–32)
Chloride: 100 mEq/L (ref 96–112)
Creatinine, Ser: 0.75 mg/dL (ref 0.4–1.2)
GFR calc non Af Amer: >60 mL/min (ref >60)
Glucose, Bld: 304 mg/dL — ABNORMAL HIGH (ref 70–99)
Potassium: 3.9 mEq/L (ref 3.5–5.1)
Total Bilirubin: 0.5 mg/dL (ref 0.3–1.2)

## 2010-04-30 LAB — HEMOGLOBIN A1C: Mean Plasma Glucose: 312 mg/dL

## 2010-04-30 LAB — LIPID PANEL
HDL: 34 mg/dL — ABNORMAL LOW (ref >39)
Total CHOL/HDL Ratio: 3.6 RATIO
Triglycerides: 65 mg/dL (ref <150)
VLDL: 13 mg/dL (ref 0–40)

## 2010-04-30 LAB — POCT I-STAT, CHEM 8
BUN: 11 mg/dL (ref 6–23)
Calcium, Ion: 1.05 mmol/L — ABNORMAL LOW (ref 1.12–1.32)
Chloride: 102 mEq/L (ref 96–112)
Glucose, Bld: 382 mg/dL — ABNORMAL HIGH (ref 70–99)
Potassium: 4 mEq/L (ref 3.5–5.1)

## 2010-04-30 LAB — DIFFERENTIAL
Basophils Absolute: 0 10*3/uL (ref 0.0–0.1)
Lymphocytes Relative: 46 % (ref 12–46)
Lymphs Abs: 3.2 10*3/uL (ref 0.7–4.0)
Monocytes Absolute: 0.5 10*3/uL (ref 0.1–1.0)
Neutro Abs: 3.2 10*3/uL (ref 1.7–7.7)

## 2010-04-30 LAB — SALICYLATE LEVEL: Salicylate Lvl: <4 mg/dL (ref 2.8–20.0)

## 2010-04-30 LAB — PREGNANCY, URINE
Preg Test, Ur: NEGATIVE
Preg Test, Ur: NEGATIVE

## 2010-04-30 LAB — ETHANOL: Alcohol, Ethyl (B): <5 mg/dL (ref 0–10)

## 2010-05-04 LAB — GLUCOSE, CAPILLARY
Glucose-Capillary: 224 mg/dL — ABNORMAL HIGH (ref 70–99)
Glucose-Capillary: 260 mg/dL — ABNORMAL HIGH (ref 70–99)

## 2010-05-04 LAB — URINALYSIS, ROUTINE W REFLEX MICROSCOPIC
Glucose, UA: >1000 mg/dL — AB
Ketones, ur: 15 mg/dL — AB
Ketones, ur: NEGATIVE mg/dL
Ketones, ur: NEGATIVE mg/dL
Leukocytes, UA: NEGATIVE
Leukocytes, UA: NEGATIVE
Protein, ur: 30 mg/dL — AB
Protein, ur: NEGATIVE mg/dL
Urobilinogen, UA: 0.2 mg/dL (ref 0.0–1.0)
Urobilinogen, UA: 0.2 mg/dL (ref 0.0–1.0)
pH: 6 (ref 5.0–8.0)

## 2010-05-04 LAB — CBC
MCHC: 31.7 g/dL (ref 30.0–36.0)
MCV: 79.6 fL (ref 78.0–100.0)
Platelets: 280 10*3/uL (ref 150–400)

## 2010-05-04 LAB — POCT I-STAT 3, VENOUS BLOOD GAS (G3P V)
Acid-Base Excess: 3 mmol/L — ABNORMAL HIGH (ref 0.0–2.0)
Bicarbonate: 29.4 mEq/L — ABNORMAL HIGH (ref 20.0–24.0)
pCO2, Ven: 52.5 mmHg — ABNORMAL HIGH (ref 45.0–50.0)
pH, Ven: 7.356 — ABNORMAL HIGH (ref 7.250–7.300)
pO2, Ven: 26 mmHg — CL (ref 30.0–45.0)

## 2010-05-04 LAB — POCT PREGNANCY, URINE: Preg Test, Ur: NEGATIVE

## 2010-05-04 LAB — URINE MICROSCOPIC-ADD ON

## 2010-05-04 LAB — POCT I-STAT, CHEM 8
Calcium, Ion: 1.2 mmol/L (ref 1.12–1.32)
Calcium, Ion: 0.98 mmol/L — ABNORMAL LOW (ref 1.12–1.32)
Chloride: 100 mEq/L (ref 96–112)
Glucose, Bld: 298 mg/dL — ABNORMAL HIGH (ref 70–99)
Glucose, Bld: 358 mg/dL — ABNORMAL HIGH (ref 70–99)
HCT: 39 % (ref 36.0–46.0)
HCT: 40 % (ref 36.0–46.0)
HCT: 48 % — ABNORMAL HIGH (ref 36.0–46.0)
Hemoglobin: 13.3 g/dL (ref 12.0–15.0)
Hemoglobin: 13.6 g/dL (ref 12.0–15.0)
Potassium: 4.4 mEq/L (ref 3.5–5.1)
Sodium: 135 mEq/L (ref 135–145)
Sodium: 137 mEq/L (ref 135–145)
TCO2: 27 mmol/L (ref 0–100)
TCO2: 23 mmol/L (ref 0–100)

## 2010-05-04 LAB — DIFFERENTIAL
Basophils Relative: 1 % (ref 0–1)
Eosinophils Absolute: 0.1 10*3/uL (ref 0.0–0.7)
Neutrophils Relative %: 48 % (ref 43–77)

## 2010-05-05 LAB — CBC
MCHC: 33.4 g/dL (ref 30.0–36.0)
MCV: 79.6 fL (ref 78.0–100.0)
Platelets: 244 10*3/uL (ref 150–400)
RBC: 4.43 MIL/uL (ref 3.87–5.11)
WBC: 5.7 10*3/uL (ref 4.0–10.5)

## 2010-05-05 LAB — URINALYSIS, ROUTINE W REFLEX MICROSCOPIC
Leukocytes, UA: NEGATIVE
Nitrite: NEGATIVE
Specific Gravity, Urine: 1.037 — ABNORMAL HIGH (ref 1.005–1.030)
Urobilinogen, UA: 1 mg/dL (ref 0.0–1.0)
pH: 6 (ref 5.0–8.0)

## 2010-05-05 LAB — GLUCOSE, CAPILLARY
Glucose-Capillary: 251 mg/dL — ABNORMAL HIGH (ref 70–99)
Glucose-Capillary: 330 mg/dL — ABNORMAL HIGH (ref 70–99)

## 2010-05-05 LAB — POCT I-STAT, CHEM 8
Chloride: 101 mEq/L (ref 96–112)
Glucose, Bld: 313 mg/dL — ABNORMAL HIGH (ref 70–99)
HCT: 38 % (ref 36.0–46.0)
Hemoglobin: 12.9 g/dL (ref 12.0–15.0)
Potassium: 3.9 mEq/L (ref 3.5–5.1)
Sodium: 138 mEq/L (ref 135–145)

## 2010-05-05 LAB — URINE MICROSCOPIC-ADD ON

## 2010-05-05 LAB — DIFFERENTIAL
Basophils Relative: 1 % (ref 0–1)
Eosinophils Absolute: 0.2 10*3/uL (ref 0.0–0.7)
Lymphs Abs: 2.9 10*3/uL (ref 0.7–4.0)
Monocytes Relative: 7 % (ref 3–12)
Neutro Abs: 2.2 10*3/uL (ref 1.7–7.7)
Neutrophils Relative %: 38 % — ABNORMAL LOW (ref 43–77)

## 2010-06-02 NOTE — Consult Note (Signed)
[name], [name]                ACCOUNT NO.:  [id number]   MEDICAL RECORD NO.:  [id number]          PATIENT TYPE:  OBV   LOCATION:  [id number]                         FACILITY:  [facility]   PHYSICIAN:  Felizardo Hoffmann, M.D.  DATE OF BIRTH:  February 08, 1980   DATE OF CONSULTATION:  04/03/2008  DATE OF DISCHARGE:  04/03/2008                                 CONSULTATION   INPATIENT CONSULTATION FOLLOWUP   [name] is very encouraged by her improvement in sleep; however, she  is still having some insomnia.  She was able to sleep 6 hours.   Her mood has greatly improved.  Her energy is now better at  approximately 80% of normal.  She does not have any thoughts of harming  herself or others.  She has no hallucinations or delusions.  Her  orientation and memory function are intact.  She is cooperative and  socially appropriate.   Today she listened intently to her primary care physician's education  about self-care at home.  She has constructive future goals.  Her  irritability has now resolved with her improved sleep.   She is not having any adverse trazodone or venlafaxine effect.   MENTAL STATUS EXAM:  [name] is oriented to all spheres.  Her  attention span is normal.  Concentration normal.  Affect is bright and  appropriate.  Mood within normal limits.  Speech is normal.  Thought  process:  Logical, coherent, goal-directed.  No looseness of  associations, thought content.  No thoughts of harming herself or  others.  No delusions or hallucinations.  Her insight is intact.  Judgment is intact.   ASSESSMENT:   AXIS I:  1. 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  2. Major depressive disorder, recurrent.  In partial remission.  3. Rule out alcohol dependence, binging type.   [name] is not at risk to harm herself or others.  She agrees to  call emergency services immediately for any thoughts of harming herself  or thoughts of harming others or to stress.   The  undersigned still recommends an inpatient dual-diagnosis program,  given her history of alcohol binging, and she still has some residual  insomnia.  However, [name] declines, and she is not committable.   She agrees to not drive if drowsy.   RECOMMENDATIONS:  1. Would increase the Effexor, as discussed before by 37.5 mg every 1      to 2 days until the target dose of 75 mg p.o. b.i.d. while      monitoring blood pressure.  2. Would increase her trazodone to 100 mg q.h.s.  3. Would ask the social worker to arrange outpatient [facility]      followup.  4. Twelve-step groups.  5. [facility]      psychotropic medication management.  6. Other options include the clinics at [facility], or [facility].      Felizardo Hoffmann, M.D.  Electronically Signed     JW/MEDQ  D:  04/03/2008  T:  04/03/2008  Job:  [id number]

## 2010-06-02 NOTE — Discharge Summary (Signed)
[name], [name]                ACCOUNT NO.:  [id number]   MEDICAL RECORD NO.:  [id number]          PATIENT TYPE:  OBV   LOCATION:  5122                         FACILITY:  [facility]   PHYSICIAN:  Rise Patience, MDDATE OF BIRTH:  1980-11-25   DATE OF ADMISSION:  03/31/2008  DATE OF DISCHARGE:  04/03/2008                               DISCHARGE SUMMARY   COURSE IN THE HOSPITAL:  A 30 year old female who presented with severe  depression, was found to have increased blood sugar as the patient was  feeling very depressed.  She did not take her medications for 3-4 days.  The patient admitted to medical floor, started on her regular  medication.  I placed her on antidepressant.  Psychiatric consult was  obtained with Dr. Rhona Raider who advised to start the patient on Effexor  and trazodone, and eventually transferred to [facility].  The  patient's blood sugar gradually got better.  On admission, the patient's  blood sugar was high, but had no anion gap.  Presently, the patient's  blood sugar was reasonably well controlled.  The patient was educated  about diabetes.  We will also be sending home the patient with NovoLog  sliding scale which I have educated the patient about.  At this time,  the patient is showing no signs of any suicidal ideation and is very  eager to go home.  I would send the patient home if it is okay with the  psychiatrist and I have extensively educated the patient about diabetes  mellitus type 2, insulin, hypoglycemia.  The patient is very  enthusiastic and had shown clear interest in education I have provided.   PROCEDURES DURING THE STAY:  None.   PERTINENT LABS:  On admission, the patient had a blood sugar of 304,  anion gap was 7, hemoglobin A1c was 12.5.   CONSULTS OBTAINED:  Psychiatrist Felizardo Hoffmann, M.D.   MEDICATIONS AT DISCHARGE:  1. Albuterol HFA 2 puffs q.6. p.r.n.  2. Advair Diskus 100/50, one puff b.i.d.  3. Actos 30 mg daily.  4.  Lantus insulin 15 units subcu at bedtime.  5. NovoLog insulin sliding scale premeals t.i.d., 151-200 two units      subcutaneous, 201-250 four units subcutaneous, 251-300 six units      subcutaneous, 301-350 eight units subcutaneous, and 351-400 ten      units subcutaneous.  6. Effexor 37.5 mg p.o. daily.  7. Trazodone 50 mg p.o. at bedtime.   PLAN:  At this time, the patient is wanting to go home.  Has no suicidal  ideation, wants to take care of her children.  She states that there is  nobody at home from today as the family members are going to [place]  and she will need to take care of her children.  We will discharge her  home if it is okay with the psychiatrist and the patient has been  extensively educated about diabetes, insulin, and hypoglycemia, and  insulin sliding scale.  The patient is to be on a cardiac-healthy carb-  modified diet.  The patient advised not to skip  her medications.  The  patient agrees and verbalizes understanding.  We will discharge home if  it is okay with psychiatrist, awaiting their response.      Rise Patience, MD  Electronically Signed     ANK/MEDQ  D:  04/03/2008  T:  04/04/2008  Job:  [id number]

## 2010-06-02 NOTE — H&P (Signed)
NAMEMELDOY, GUFFEY                ACCOUNT NO.:  [id number]   MEDICAL RECORD NO.:  [id number]          PATIENT TYPE:  OBV   LOCATION:  5128                         FACILITY:  [facility]   PHYSICIAN:  Jana Hakim, M.D. DATE OF BIRTH:  17-Dec-1980   DATE OF ADMISSION:  03/31/2008  DATE OF DISCHARGE:                              HISTORY & PHYSICAL   PRIMARY CARE PHYSICIAN:  [facility].   CHIEF COMPLAINT:  Severe depression.   HISTORY OF PRESENT ILLNESS:  This is a 30 year old female with a history  of type 2 diabetes mellitus who presents to the emergency department for  evaluation of symptoms of severe depressed mood and decreased intake of  foods and liquids over the past 3 day period.  The patient denies having  any precipitating events causing her severe depression.  The patient  states that she just feels very bad.  She denies having any trouble  sleeping.  She reports sleeping too much.  The patient also reports her  blood sugars have been high and that she has not been on medication for  the past 3 to 4 months  She states that she has been out of medications  secondary to not been able to get back into [facility] or get  recertified for [facility].   The patient denies having any fevers, chills, chest pain, congestion,  shortness of breath, nausea, vomiting, or diarrhea.  She denies having  any syncope, lightheadedness.  She does report having fatigue.   PAST MEDICAL HISTORY:  1. Type 2 diabetes mellitus.  2. Asthma.  3. Depression and anxiety.   PAST SURGICAL HISTORY:  None.   MEDICATIONS:  The patient has not been taking her medications at this  time.  Previously she had been on Lantus 10 units subcu daily.  She had  been on Actos, Lexapro, metformin, Advair discus and albuterol rescue  inhaler.   ALLERGIES:  No known drug allergies.   SOCIAL HISTORY:  The patient is a nonsmoker, nondrinker.   FAMILY HISTORY:  Noncontributory.   REVIEW OF SYSTEMS:  Pertinents  are mentioned above.   PHYSICAL EXAMINATION:  GENERAL FINDINGS:  This is a 30 year old  overweight, well-developed female in discomfort but no acute distress.  VITAL SIGNS: Temperature 98.2, blood pressure 127/93, heart rate 80,  respirations 18, O2 saturation 98-100%.  HEENT EXAMINATION:  Normocephalic, atraumatic.  There is no scleral  icterus.  Pupils are equal, round, reactive to light.  Extraocular  movements are intact.  Funduscopic benign.  Nares are patent  bilaterally.  Oropharynx is clear.  NECK:  Supple, full range of motion.  No thyromegaly, adenopathy or  jugular venous distention.  CARDIOVASCULAR:  Regular rate and rhythm.  No murmurs, gallops or rubs.  LUNGS: Clear to auscultation bilaterally.  ABDOMEN: Positive bowel sounds, soft, nontender, nondistended.  No  hepatosplenomegaly.  EXTREMITIES:  Without cyanosis, clubbing or edema.  NEUROLOGIC EXAMINATION:  The patient is alert and oriented x3.  There  are no focal deficits on examination.   LABORATORY STUDIES:  White blood cell count 5.6, hemoglobin 12.8,  hematocrit 38.8, platelets 242, MCV 79.3.  Sodium 134, potassium 4.0,  chloride 102, bicarb of 25, BUN 11, creatinine 0.8, glucose 382.  Acetaminophen level less than 10.0.  Urine drug screen positive for  opiates, negative for all other factors that were tested.  Salicylate  level less than 4.0.  Alcohol level less than 5.  Urinalysis negative  except for greater than 1000 glucose and moderate hemoglobin.   ASSESSMENT:  The 30 year old female being admitted with:  1. Hyperglycemia and uncontrolled type 2 diabetes mellitus.  2. Severe depression.   PLAN:  The patient will be admitted for 23-hour observation and the  patient will be restarted on her medications.  Sliding scale insulin  coverage has been ordered for elevated blood sugars as well.  The  patient will be placed on IV fluids.  Her electrolytes will be corrected  as needed and DVT and GI prophylaxis  have been ordered.  A psychiatric  evaluation will be requested as well in the a.m.      Jana Hakim, M.D.  Electronically Signed     HJ/MEDQ  D:  04/01/2008  T:  04/01/2008  Job:  [id number]

## 2010-06-02 NOTE — Consult Note (Signed)
[name], [name]                ACCOUNT NO.:  [id number]   MEDICAL RECORD NO.:  [id number]          PATIENT TYPE:  OBV   LOCATION:  [id number]                         FACILITY:  [facility]   PHYSICIAN:  Felizardo Hoffmann, M.D.  DATE OF BIRTH:  [birth year]   DATE OF CONSULTATION:  04/01/2008  DATE OF DISCHARGE:                                 CONSULTATION   REASON FOR CONSULTATION:  Severe depression.   REQUESTING PHYSICIAN:  [facility].   HISTORY OF PRESENT ILLNESS:  [name] is a 30 year old single female  admitted on March 31, 2008 to [facility] due to hyperglycemia.  She does have difficulty self-managing her diabetes at home.  She has  not been eating well.  She said she also has not been drinking well.  She has had several weeks of progressive neurovegetative symptoms of  depression including poor energy, poor concentration and insomnia as  well as anhedonia.  She denies any suicidal thoughts.  She states that  she has been having some nonspecific homicidal ideation.  This appears  to develop when she has my buttons pushed.  She denies any physical  altercation initiation.  However, she states during these times that she  becomes very upset and cusses the person out.  She notes that this  irritability has been part of her depression.   She does have intact orientation and memory function.  She is  cooperative with bedside care.  She is very cooperative with the  undersigned's discussion and mental status exam.  She is not having  hallucinations or delusions.  Her insomnia has been very poor.  She also has been binging on alcohol.  She does not drink daily.  However, when she does drink vodka, she  drinks to severe intoxication.   PAST PSYCHIATRIC HISTORY:  She has been in treatment with Lexapro for  months and it did not help her symptoms at all.  She received the  Lexapro from an outpatient general medical clinic.  She does not have  any history of mania, suicide  attempts or psychiatric admission.  She  has no history of hallucinations.   FAMILY PSYCHIATRIC HISTORY:  None known.   SOCIAL HISTORY:  She does have some children.  However, they do not live  with her.  She has been living at home with her dog.  Therefore, she has  poor social support.   Please see the discussion above concerning alcohol.   PAST MEDICAL HISTORY:  Diabetes mellitus type 2, asthma.   ALLERGIES:  No known drug allergies.   REVIEW OF SYSTEMS:  Unremarkable.   MENTAL STATUS EXAM:  [name] has poor eye contact.  She has  psychomotor slowing.  She has tears in her eyes and has poor  concentration.  The patient's affect is very constricted with tears.  Mood is depressed.  She is oriented to all spheres.  Her memory is  intact to immediate, recent and remote.  Fund of knowledge and  intelligence are normal.  However, as mentioned she does have some  thought slowing.  Speech has a mildly  flat prosody.  There is no  dysarthria.  She does have some mild poverty of speech.  Thought process  is coherent, logical, goal-directed.  Thought content:  Please see the  above.  She is not currently having any thoughts of harming herself or  others.  Her insight is intact.  Judgment is intact regarding informed  consent for psychiatric care.   ASSESSMENT:  Axis I:  293.83 Mood disorder not otherwise specified, depressed.  Major depressive disorder.  Rule out alcohol dependence, binging type.  Axis II:  Deferred.  Axis III:  See past medical history.  Axis IV:  Primary support group general medical.  Axis V:  73.   Given Ms. Leibensperger non response to outpatient treatment with Lexapro,  her alcohol binging, her extreme insomnia, her high neurovegetative  number of symptoms, her difficulty performing activities of daily  living, the interference of her depression with her diet and management  of diabetes leading to general medical admission, she is an excellent  candidate for  inpatient psychiatric care.   RECOMMENDATIONS:  1. Would admit to an inpatient psychiatric unit for dual diagnosis      treatment.  2. The indications, alternatives and adverse effects of the following      were discussed with the patient:  Trazodone for augmenting      venlafaxine and anti-insomnia.  Venlafaxine for antidepression      including the risk of high blood pressure.  3. The undersigned provided ego supportive psychotherapy and      education.  Ms. Saleeby understands the discussion of the      medication and wants to proceed as below.  4. Would start Effexor in the generic nonsustained release form 37.5      mg p.o. q.a.m.  5. Discontinue Lexapro.  6. Start Trazodone at 50 mg p.o. nightly and then increase to an      approximate dosage of 100 mg nightly until sleep is normal, not to      exceed 200 mg nightly.  Ms. Polcyn agrees to not drive if drowsy.  7. Would monitor for increased blood pressure as Effexor is titrated      by 37.5 mg daily to the initial target dose of 75 mg p.o. b.i.d.      Felizardo Hoffmann, M.D.  Electronically Signed     JW/MEDQ  D:  04/01/2008  T:  04/01/2008  Job:  [id number]

## 2010-06-05 NOTE — Discharge Summary (Signed)
[name], [name]                          ACCOUNT NO.:  [id number]   MEDICAL RECORD NO.:  [id number]                   PATIENT TYPE:  INP   LOCATION:  5705                                 FACILITY:  [facility]   PHYSICIAN:  Salome Holmes, MD                     DATE OF BIRTH:  Jun 04, 1980   DATE OF ADMISSION:  01/14/2003  DATE OF DISCHARGE:  01/15/2003                                 DISCHARGE SUMMARY   DISCHARGE DIAGNOSES:  1. Diarrhea, probably viral gastroenteritis.  2. Diabetes mellitus, type 2, uncontrolled.  3. Bronchial asthma.  4. Obesity.  5. Anemia, microcytic.  Hemoglobin on discharge 11.0.   DISCHARGE MEDICATIONS:  1. Metformin 500 mg daily.  2. Lantus insulin 20 units at bedtime.  3. Albuterol 2 puffs q.4h. as needed.   FOLLOWUP:  Followup office visit on Thursday, 01/24/2003 at 11:30.   HISTORY OF PRESENT ILLNESS:  The patient is 30 year old African-American  female with diabetes mellitus diagnosis in 05/2002.  The patient was  discharged on a combination of Glucovance and Lantus. She was taking only 20  units of Lantus. Three days prior to admission she states that she started  to have nausea, vomiting and diarrhea, watery without a lot of mucous.  Four  days prior to admission she noted that her blood glucose increased.  She had  increased weakness and she presented to the emergency department.   PHYSICAL EXAMINATION:  VITAL SIGNS:  Temperature 98.6.  Pulse 91.  Respiratory rate 22.  Blood pressure 124/83.  Oxygen saturation 99% on room  air.  GENERAL:  She is obese, in no acute distress.  HEENT:  Eyes, PERRLA.  EOMs are intact.  Oropharynx clear.  NECK:  Supple.  No JVD or thyromegaly.  RESPIRATORY:  Clear to auscultation, bilaterally.  Good air movement.  CARDIOVASCULAR:  Regular rate and rhythm.  No murmur, gallop or rub.  ABDOMEN:  Soft, nontender.  There is some epigastric tenderness.  No  guarding.  No rebound. Liver is normal size.  EXTREMITIES: No  edema.  SKIN: Dry, no rashes, no lymphadenopathy.  NEUROLOGIC:  Intact.   LABORATORY DATA ON ADMISSION:  White blood cell count 5.9, red blood cell  count 4.24, hemoglobin 14.3, hematocrit 42, pH on room air 7.32, pc02 43,  p02 46, bicarbonate 22.7.  Oxygen saturation 79. Neutrophils 2.  On  admission, sodium 135, potassium 3.9, chloride 103, glucose 462, BUN 10,  creatinine 0.7.  AST 32.  ALT 29.  Alkaline phosphatase 69.  Total bilirubin  0.4.  Lipase 34.  CK 146.  Pregnancy test was negative.  Urine drug screen  was negative.   HOSPITAL COURSE:  1. The patient was admitted with polydipsia, polyrrhea, nausea, vomiting,     diarrhea and hyperglycemia, 462.  At the time of admission diabetes     mellitus was uncontrolled probably secondary to developing diarrhea and  dehydration.  The patient was given some fluids.  She was started on     insulin NPH which was added to Lantus.  NPH was changed to Metformin on     the day prior to discharge.  The patient was discharged on the next day     with a blood glucose of 250.  She has an appointment with [facility]     for followup.  The patient was seen by diabetes coordinator to get some     education about medications that she has to take.  2. Diarrhea.  This problem was worked up to identify the etiology of the     diarrhea, all tests for diarrhea are negative.  The patient probably had     a viral gastroenteritis.  3. Anemia.  Microcytic with hemoglobin 11.0.  Iron studies were done and     revealed iron level 32.  TIBC 297 and oxygen saturation 13.  _________     was normal, 77.   The patient was discharged in stable condition and her laboratory studies on  discharge reveal white blood cell count of 5.9, red blood cells 4.24,  hemoglobin 11., hematocrit 32.8, MCV 77, sodium 135, potassium 3.8, chloride  108, c02 23, glucose 250, BUN 7, creatinine 0.8, calcium 8.3.                                                Salome Holmes,  MD    OB/MEDQ  D:  03/12/2003  T:  03/13/2003  Job:  [id number]

## 2010-06-05 NOTE — Discharge Summary (Signed)
[name], [name]                          ACCOUNT NO.:  [id number]   MEDICAL RECORD NO.:  [id number]                   PATIENT TYPE:  INP   LOCATION:  2907                                 FACILITY:  [facility]   PHYSICIAN:  Gust Brooms, MD                    DATE OF BIRTH:  [birth year]   DATE OF ADMISSION:  08/19/2003  DATE OF DISCHARGE:  08/22/2003                                 DISCHARGE SUMMARY   CHIEF COMPLAINT:  Dizziness, fatigue, nausea, vomiting.   DISCHARGE DIAGNOSES:  1. Diabetic ketoacidosis, now resolved.  2. Diabetes mellitus type 2, insulin dependent.  3. Asthma.  4. Obesity.  5. Hyperlipidemia.   DISCHARGE MEDICATIONS:  1. Lantus 45 units subcutaneously at bedtime every day.  2. Metformin 500 mg one p.o. b.i.d.   DISPOSITION:  The patient is being discharged home with instructions to  follow up at the [facility] on August 26, 2003, at 2 p.m.  She is  to check her finger stick glucose t.i.d. before meals and report the values  at her [facility] appointment on August 26, 2003, so that her  insulin doses can be adjusted.  After her appointment, she will be scheduled  to follow up at her continuity clinic for further management with Dr.  Darrold Span.   PROCEDURES:  EKG on August 19, 2003, showed normal sinus rhythm with no acute  signs of ischemia or electrolyte disturbances.   CONSULTATIONS:  1. Diabetic education consult on August 21, 2003, recommended diabetes     education followup as an outpatient.  2. Nutrition was also consulted which had the same recommendation.   HISTORY OF PRESENT ILLNESS:  This is a 30 year old African-American female  with a history of type 2 diabetes since early 2004, who presented to the  emergency room with a three day history of nausea, vomiting, diarrhea,  polyuria, polydipsia, and an one day history of dizziness.  Her CBG measured  465 in the ER with a bicarbonate of 18, and an anion gap of 14.  She also  had  4+ ketones, and 4+ glucose in her urine.  Of note, she also presented to  the ER on July 24, 2003, with similar symptoms and was discharged after she  was stabilized.  She was admitted to the Internal Medicine Teaching Service  for further evaluation and management.   LABORATORY DATA:  Her white blood cell count was 7.8 on admission,  hemoglobin of 15.3, hematocrit of 43.5, platelets is 221.   Sodium was 132, potassium 5, chloride 100, bicarbonate 18, glucose 465, BUN  16, creatinine 1.3, calcium 9.9.  Venous lactate was 1.4.  Urine pregnancy  test was negative.  Urinalysis was negative except for ketones over 80 and  glucose over 1000.  Hemoglobin A1C was 14.2.  TSH was 0.985.  Magnesium was  2.  Phosphorus was 2.8.  Total cholesterol  was 206, triglycerides count was  175, HDL cholesterol was 42, LDL cholesterol was 129, VLDL was 35.  Alkaline  phosphatase was 76, SGOT was 14, SGPT was 14 as well, total protein was 7,  albumin was 3, total bilirubin was 0.6.   HOSPITAL COURSE:  #1 -  HYPERGLYCEMIA:  The patient was given a 10 unit  bolus of regular insulin and started on an insulin drip at 5 units an hour  for four hours during which her anion gap closed to 9.  She was then  switched to Lantus 20 units q.h.s. and sliding scale insulin to be used at  each meal.  This was done because her CBG's had fallen to that point to 177  mg/dl, and we did not want to make her hypoglycemic.  Her CBG's were checked  hourly initially and then q.a.c. after hospital day #1.  She was also begun  on normal saline without dextrose.  Her CBG's were checked hourly initially  and then before each meal after hospital day #1.  She was also begun on  normal saline without dextrose at 250 ml an hour at that time.  Potassium  was not supplemented initially due to the value being greater than 4, but it  later dropped to 3.9, and she was supplemented with K-Dur 40 mEq p.o. x1.  A.m. labs were drawn, including  hemoglobin A1C which is noted above at 14.2.  Diabetes education and nutrition were consulted, and she was provided with  education and instructions to follow up for further diabetic teaching as an  outpatient.  Her CBG values remained elevated on day #1, so her Lantus was  increased initially to 30 units q.h.s., later to 40 units, and then to 45  units q.h.s. on the day of discharge.  On discharge, her last CBG was in the  mid 200s after 15 units of regular insulin per sliding scale in the last 24  hours.  #2 -  ASTHMA:  The patient did not have any complaints during this  hospitalization regarding her asthma.  #3 -  OBESITY:  The patient was counseled on weight loss per nutrition  consult.  #4 -  HYPERLIPIDEMIA:  The patient's LDL and triglycerides were elevated as  shown above.  The patient was counseled on diet and exercise management for  reducing these lipids with her goal being a LDL of less than 100.  Her lipid  levels will be managed on an outpatient basis.      Massie Bougie, MD    AP/MEDQ  D:  08/23/2003  T:  08/24/2003  Job:  [id number]

## 2010-06-05 NOTE — Discharge Summary (Signed)
NAME:  [name], [name]                          ACCOUNT NO.:  [id number]   MEDICAL RECORD NO.:  [id number]                   PATIENT TYPE:  INP   LOCATION:  5707                                 FACILITY:  [facility]   PHYSICIAN:  Ashby Dawes. Polite, M.D.              DATE OF BIRTH:  04/20/1980   DATE OF ADMISSION:  06/04/2002  DATE OF DISCHARGE:  06/06/2002                                 DISCHARGE SUMMARY   DISCHARGE DIAGNOSES:  1. Newly diagnosed diabetes mellitus.  2. Asthma.   DISCHARGE MEDICATIONS:  1. Glucovance 2.5/500 one twice daily with meals.  2. Lantus insulin 20 units every evening at bedtime.  3. Albuterol MDI 2 puffs every 6 hours as needed for shortness of breath or     wheezing.   CONSULTATIONS:  None.   PROCEDURES:  None.   LABORATORY DATA:  Sodium 136, potassium 3.8, chloride 108, CO2 23, glucose  297, BUN 5, creatinine 0.8, calcium 8.2.  Total cholesterol 173,  triglycerides 198, HDL 33, LDL 100.  Urine for microalbumin pending at  discharge.  Hemoglobin A1C 12.2.  Urinalysis was negative.  Urine pregnancy  screen was negative.   DISPOSITION:  The patient is being discharged home.   CONDITION ON DISCHARGE:  Stable.   HISTORY OF PRESENT ILLNESS:  This is a 30 year old female who presented to  [facility] on 17 with a one-week history of nausea, vomiting, increased  girth, nocturia, dizziness, and weakness.  The patient has a history of  asthma and was discharged on April 23 after exacerbation of asthma.  In the  emergency room, glucose was measured at 626 with a CO2 of 22.  The patient  was admitted for further evaluation and treatment.   HOSPITAL COURSE:  1. NEW ONSET DIABETES MELLITUS:  The patient was admitted, hydrated with IV     fluids, started on IV insulin per Glucommander.  Serial labs were done.     Hemoglobin A1C and lipid profile were done with results as noted above.     By hospital day #2, insulin drip was discontinued.  She was started on  Lantus insulin.  CBGs remained elevated in the 200 to 300 range.  The     patient was provided with a CBG monitor and instruction with diabetic     teaching.  She was also started on Glucovance 2.5/500 twice a day along     with Lantus insulin 20 units at bedtime at discharge.  The patient has an     appointment at [facility] at 2 p.m. today.   1. ASTHMA:  Stable throughout this hospitalization.    DISCHARGE INSTRUCTIONS:  1. Again, the patient was provided with diabetic monitoring, instruction,     and carbohydrate restricted diet information along with outpatient     followup on diabetic teaching.  2. The patient should follow up with her primary care physician at [facility] today.     Stephanie Martinique, NP                      Ashby Dawes. Polite, M.D.    SJ/MEDQ  D:  06/06/2002  T:  06/06/2002  Job:  [id number]   cc:   [facility]

## 2010-06-05 NOTE — Discharge Summary (Signed)
   [name], [name]                          ACCOUNT NO.:  [id number]   MEDICAL RECORD NO.:  [id number]                   PATIENT TYPE:  INP   LOCATION:  5708                                 FACILITY:  [facility]   PHYSICIAN:  Jerelene Redden, MD                   DATE OF BIRTH:  1980-08-05   DATE OF ADMISSION:  05/10/2002  DATE OF DISCHARGE:  05/11/2002                                 DISCHARGE SUMMARY   HISTORY OF PRESENT ILLNESS:  The patient is a 30 year old lady who presented  to [facility] on 05/10/2002 with a several-day history of cough and  chest congestion associated with increasingly severe dyspnea over 24 hours.  On presentation to the emergency room the patient was given nebulizer  treatments but after several hours her symptoms did not resolve and it was  felt prudent to admit her to the hospital for further treatment.   PHYSICAL EXAMINATION AT TIME OF DISCHARGE AS DESCRIBED BY DR. RHEE:  Blood  pressure was 121/86, pulse was 80, respirations 20.  HEENT exam was within  normal limits.  The examination of the chest revealed poor air movement.  There was a diffuse wheezing.  There was prolongation of expiratory phase.  Cardiovascular exam revealed normal S1 and S2; there were no rubs, murmurs,  or gallops.  The abdomen was benign; there were normal bowel sounds without  masses or tenderness.  The examination of extremities revealed no evidence  of cyanosis or edema.   BRIEF HOSPITAL COURSE:  The patient was treated with albuterol and Atrovent  which was given on a q.4h. basis.  She was placed on p.o. Zithromax and  received IV Solu-Medrol 60 mg every 8 hours.  The patient's condition  gradually improved.  By 05/11/2002 she seemed to be doing well, she was  having no respiratory distress, she was breathing comfortably, therefore  decision was made to discharge the patient at that time.   DISCHARGE DIAGNOSIS:  Acute and chronic asthma.   DISCHARGE MEDICATIONS:  1.  Combivent inhaler three puffs q.i.d.  2. Zithromax 250 mg x5 additional days.  3. Prednisone which the patient will take 40 x3, 30 x3, 20 x3, 10 x3, and     then stop.   FOLLOW UP:  The patient was strongly encouraged to follow up with  [facility] on an ongoing basis.   CONDITION ON DISCHARGE:  Her condition was good at the time of discharge.                                               Jerelene Redden, MD    SY/MEDQ  D:  05/11/2002  T:  05/13/2002  Job:  [id number]   cc:   Karoline Caldwell

## 2010-06-05 NOTE — Op Note (Signed)
NAMEANQUNETTE, FRIEBEL                          ACCOUNT NO.:  [id number]   MEDICAL RECORD NO.:  [id number]                   PATIENT TYPE:  INP   LOCATION:  6711                                 FACILITY:  [facility]   PHYSICIAN:  Lenn Cal, D.D.S.          DATE OF BIRTH:  13-Dec-1980   DATE OF PROCEDURE:  02/07/2003  DATE OF DISCHARGE:                                 OPERATIVE REPORT   PREOPERATIVE DIAGNOSIS:  1. Diabetes mellitus type 2.  2. History of acute pulpitis.  3. Dental caries.   POSTOPERATIVE DIAGNOSIS:  1. Diabetes mellitus type 2.  2. History of acute pulpitis.  3. Dental caries.   OPERATION:  1. Dental examination.  2. Surgical extraction of tooth number 5.  3. Alveoloplasty with extraction.   SURGEON:  Lenn Cal, D.D.S.   ASSISTANT:  Jory Sims, dental assistant   ANESTHESIA:  Monitored anesthesia care per the anesthesia team.   MEDICATIONS:  1. Ampicillin 2 grams IV prior to invasive dental procedures.  2. Local anesthesia with total utilization of 2 carpules each containing 36     mg Xylocaine with 0.018 mg of epinephrine.   SPECIMENS:  There was one tooth which was discarded.   CULTURES:  None.   DRAINS:  None.   COMPLICATIONS:  None.   ESTIMATED BLOOD LOSS:  Less than 5 mL.   FLUIDS REPLACED:  300 mL lactated ringers solution.   INDICATIONS FOR PROCEDURE:  The patient had a history of diabetes mellitus -  type 2.  The patient experienced acute pulpitis symptoms and was evaluated  to rule out dental infection which would affect the patient's systemic  health.  The patient was then examined and treatment plan for surgical  extraction of tooth number 5 with alveoloplasty was indicated.  This  treatment plan was formulated to decrease the risks and complications  associated with dental infection from affecting the patient's systemic  health.   OPERATIVE FINDINGS:  The patient was examined in operating room number 10.  Tooth number 5  was identified for extraction.  The patient was noted to be  affected by a history of acute pulpitis as well as significant dental caries  into the pulpal area.  The aforementioned necessitated the removal of tooth  number 5 with alveoloplasty as indicated.   DESCRIPTION OF PROCEDURE:  The patient was brought to the main operating  room 10.  The patient was placed in supine position on the operating table.  Monitored anesthesia care was induced per the anesthesia team.  The patient  was then prepped and draped in the usual manner for dental medicine  procedure.  The oral cavity was thoroughly examined with the findings as  noted above.  The patient was then ready for the dental medicine procedure  as follows:   Local anesthesia was administered with total utilization of 2 carpules each  containing 36 mg Xylocaine with 0.018 mg epinephrine.  The anesthesia was  administered at the initiation of the dental medicine procedure.  The  maxillary right quadrant was first approached.  Anesthesia was delivered as  previously described.  A Woodson elevator was used to create a flap from the  mesial number 6 through the distal of number 4.  An appropriate amount of  buccal and interseptal bone was then removed appropriately.  Tooth number 5  was then elevated with a series of straight elevators.  Tooth number 5 was  removed with 150 forceps without complications.  Alveoloplasty was then  performed utilizing rongeurs and bone file.  The surgical site was then  copiously irrigated with sterile saline.  The tissues were reapproximated  and trimmed appropriately.  The surgical site was then closed utilizing a  figure-of-eight suture technique and 3-0 chromic gut suture material.  At  this point in time, the entire mouth was copiously irrigated with sterile  saline.  The patient was examined for complications, seeing none, the dental  medicine procedure was deemed to be complete.  A 4 by 4 gauze was  placed in  the extraction site to aid hemostasis at this time.  The patient was then  handed over to the anesthesia team for final disposition.  After an  appropriate amount of time, the patient was taken to the post anesthesia  care unit with stable vital signs and a good oxygenation level.  All counts  were correct for the dental medicine procedure.   The patient is to follow up for evaluation for suture removal in  approximately one week.  The patient will be continued on clindamycin 150 mg  every 8  hours for an additional 3-4 days.  The patient is to be given  appropriate pain medicine upon discharge.  The patient knows that she needs  to follow up with a general dentist to have a full series of dental  radiographs, comprehensive examination, and comprehensive treatment plan  provided at that time.                                               Lenn Cal, D.D.S.    RFK/MEDQ  D:  02/07/2003  T:  02/07/2003  Job:  [id number]   cc:   Ara D. Dyann Kief, M.D.  Fax: [phone]

## 2010-06-05 NOTE — Consult Note (Signed)
NAMESUMMERLIN, DIMERY                          ACCOUNT NO.:  [id number]   MEDICAL RECORD NO.:  [id number]                   PATIENT TYPE:  INP   LOCATION:  6711                                 FACILITY:  [facility]   PHYSICIAN:  Lenn Cal, D.D.S.          DATE OF BIRTH:  [birth year]   DATE OF CONSULTATION:  02/06/2003  DATE OF DISCHARGE:                                   CONSULTATION   HISTORY OF PRESENT ILLNESS:  [name] is a 30 year old African-American  female referred by Dr. Kandis Fantasia D. Metjian for dental consultation. The patient  was admitted with nausea, vomiting, and dehydration as well as a toothache.  Dental consultation requested for evaluation of her dentition to rule out  dental infection which may affect the patient's systemic health.   MEDICAL HISTORY:  1. Diabetes mellitus type 2.  2. History of asthma.  3. History of GERD.   ALLERGIES/ADVERSE DRUG REACTIONS:  SEAFOOD/SHELLFISH.   MEDICATIONS:  1. Clindamycin 150 mg three times daily.  2. Lantus insulin per sliding scale.  3. Novolog insulin per sliding scale.  4. Glucophage 500 mg twice daily.  5. Glipizide 5 mg daily.  6. Lisinopril 10 mg daily.   SOCIAL HISTORY:  The patient is not married and has two children. The  patient currently lives with her mother. The patient does not smoke. The  patient does not use IV drugs. The patient with occasional drinking of beer  approximately two times per week.   FAMILY HISTORY:  Noncontributory.   FUNCTIONAL ASSESSMENT:  The patient remains independent for ADLs at this  time.   REVIEW OF SYSTEMS:  This is reviewed with the patient and is included in the  dental consultation record.   DENTAL HISTORY:   CHIEF COMPLAINT:  Dental consultation requested for evaluation of a  toothache.   HISTORY OF PRESENT ILLNESS:  The patient was admitted on February 05, 2003  with a history of nausea, vomiting, and diarrhea. The patient also with a  medical history significant  for diabetes mellitus type 2. A dental  consultation was requested for evaluation of dentition to rule out dental  infection which may affect the patient's systemic health.   The patient currently complains of a toothache of the upper right quadrant.  The patient points to tooth #5 as the offending tooth. The patient indicates  this tooth had been hurting off and on for the past three weeks. The patient  indicates that the patient is currently 2/10 in intensity. The patient  indicates that yesterday it was 10/10 in intensity. The patient describes  the pain as being sharp in nature, intermittent at times, and spontaneous at  times. The patient indicates that she used Orajel at home which didn't  work. The patient indicates that she was given morphine sulfate which put  me to sleep and caused the pain to go away.   The patient has not seen a  dentist for more than 5 years. The patient has no  regular dentist.   DENTAL EXAM:  GENERAL:  The patient is a well-developed, well-nourished  female in no acute distress.  VITAL SIGNS:  Blood pressure is 115/60, pulse 93, respirations 20,  temperature 98.3 and afebrile.  HEENT:  Head and neck exam:  There is no palpable lymphadenopathy. There are  no acute TMJ symptoms.  INTRAORAL EXAM:  There is no evidence of abscess formation within the mouth.  Tooth #5 appears to be affected by dental caries. The tooth is sensitive to  percussion and palpation at this time.  DENTITION:  The patient is not missing any teeth. There is a retained root  segment #15.  PERIODONTAL:  Patient with chronic periodontitis with bone loss and  accretions.  ENDODONTIC:  Patient with a history of acute irreversible pulpitis symptoms  associated with tooth #5. There is no evidence of periapical pathology at  this time.  DENTAL CARIES:  There are multiple dental caries. I would suggest a full  series of dental radiographs to rule out the extent of the dental caries  with a  general dentist of the patient's choice.  OCCLUSION:  Occlusion appears to be stable at this time.   RADIOGRAPHIC INTERPRETATION:  A panoramic x-ray was taken on February 05, 2003.   There is a retained root segment, #15. There are multiple dental caries  noted within the mouth. There is no obvious periapical pathology noted at  this time. There is several amalgam restorations noted.   ASSESSMENT:  1. History of acute irreversible pulpitis symptoms associated with tooth #5.  2. Retained roots #15.  3. Multiple dental caries. The patient will need a full series of dental     radiographs to identify the extent of the dental caries.  4. No obvious __________ pathology is noted at this time.  5. Chronic periodontitis with accretions.  6. History of oral neglect.   PLAN/RECOMMENDATIONS:  1. I discussed the risks, benefits, and complications of various treatment     options with the patient in relationship to her medical and dental     condition. We discussed various treatment options to include no     treatment, extractions with alveoloplasty, periodontal therapy, dental     restorations, root canal therapy, crown or bridge therapy, implant     therapy, and replacement of missing teeth as indicated. We also discussed     following up with a general dentist of the patient's choice for     comprehensive dental exam after a full series of dental radiographs. The     patient currently wishes to proceed with extraction of tooth #5 with     alveoloplasty as indicated. The patient will then follow up with a     general dentist of her choice for a full series of dental radiographs,     exam, and treatment plan as indicated. The patient is aware of the     retained root segment #15 and dental caries affecting tooth #16 and 17.     The patient will follow up with a general dentist of her choice to have    these evaluated for restoration and/or extraction as indicated.  2. Discussion of findings  with Dr. Dyann Kief concerning the medical     ability/stability of the patient to undergo extraction of tooth #5 at     this time. In the meantime, the patient is to continue clindamycin 150 mg  every 8 hours     for the next 5 to 6 days. The patient may be scheduled as an outpatient     for the extraction if an operating room procedure cannot be scheduled for     Thursday February 07, 2003. The patient is currently tentatively a first     add-on for 7:30 in the morning, but this may change as of 5:00 this     evening.                                               Lenn Cal, D.D.S.    RFK/MEDQ  D:  02/06/2003  T:  02/07/2003  Job:  [id number]

## 2010-06-05 NOTE — Discharge Summary (Signed)
[name], [name]                ACCOUNT NO.:  [id number]   MEDICAL RECORD NO.:  [id number]          PATIENT TYPE:  OBV   LOCATION:  [id number]                         FACILITY:  [facility]   PHYSICIAN:  Vladimir Faster, MD        DATE OF BIRTH:  [birth year]   DATE OF ADMISSION:  10/04/2005  DATE OF DISCHARGE:  10/05/2005                                 DISCHARGE SUMMARY   DATE OF ADMISSION:  October 04, 2005   DATE OF DISCHARGE:  October 05, 2005   DISCHARGE DIAGNOSES:  1. Food poisoning.  2. Hyperglycemia secondary to noncompliance with diabetic medications.  3. Diabetes mellitus type 2.  4. History of hyperlipidemia.  5. History of asthma.  6. Obesity.   DISCHARGE MEDICATIONS:  1. Lantus insulin 30 units subcutaneous nightly.  2. Albuterol 2 puffs when needed.   HOSPITAL COURSE:  [name] is a 30 year old African-American lady who was  diagnosed with diabetes 2 years ago.  She came to the ER approximately 1  hour after eating flounder fish in restaurant and she and her friend, who  ate at the same place, had similar symptoms.  This happened within 30  minutes after eating the fish.  In the ER, her vitals were stable, she was  afebrile.  She had nausea and vomiting secondary to this and we started her  on IV fluids in the ER and her nausea has since then resolved.  She is no  longer vomiting.  Her basic metabolic panel was in the normal range, except  for an elevated blood sugar of 958.  She was started with IV fluids and then  given insulin and since then her CBGs have normalized.  Her last couple of  readings were 200 and 150.  She wanted to be discharged right now because  she has a job interview at noon today.  We will discharge her home in stable  condition.  She needs to follow up with the health service.  She says she  will follow up and I have also given her prescriptions for her Lantus  insulin, which she states she is going to take.  I believe the job interview  is more  important for her medical compliance, because if she gets a job that  will help her be more compliant with her medications.      Vladimir Faster, MD  Electronically Signed     PKN/MEDQ  D:  10/05/2005  T:  10/05/2005  Job:  [id number]

## 2010-06-05 NOTE — H&P (Signed)
NAMEEMILYGRACE, SALVAGE                ACCOUNT NO.:  [id number]   MEDICAL RECORD NO.:  [id number]          PATIENT TYPE:  EMS   LOCATION:  MAJO                         FACILITY:  [facility]   PHYSICIAN:  Mobolaji B. Bakare, M.D.DATE OF BIRTH:  1980-04-13   DATE OF ADMISSION:  10/04/2005  DATE OF DISCHARGE:                                HISTORY & PHYSICAL   PRIMARY CARE PHYSICIAN:  Unassigned.  The patient goes to [employer].   CHIEF COMPLAINT:  Nausea and vomiting, started this evening.   HISTORY OF PRESENT ILLNESS:  Ms. Clack is a 30 year old African-American  female who has history of diabetes mellitus diagnosed 2 years ago.  She  presented to the emergency room approximately 1 hour after eating flounder  fish in a restaurant.  She had the same food with a girlfriend of hers who  also developed similar symptoms less than 30 minutes after eating this fish.  Both were having nausea and vomiting.  The patient did not have abdominal  pain, no diarrhea, no fever, no chills.   She was seen in the emergency room.  Initial workup revealed hyperglycemia  of 700 to 900.  The patient admitted that she has not been using her Lantus  Insulin.  The last time she used it was 1 week ago because she did not have  any prescription to refill.  She last attended [facility], according to  her, 1 month ago.  The patient is unemployed. The patient has been started  on Glucomander in the emergency room and [facility] was called for admission  secondary to the hyperglycemia.  Nausea and vomiting has since resolved with  IV Zofran.   REVIEW OF SYSTEMS:  Negative for fever, chills, cough, chest pain, diarrhea,  headache.  No dysuria, urgency, or increased frequency of micturition.   PAST MEDICAL HISTORY:  1. Diabetes mellitus diagnosed 2 years ago.  2. History of hyperlipidemia.  3. Obesity.  4. History of asthma.   PAST SURGICAL HISTORY:  None.   MEDICATIONS:  1. Lantus Insulin 30 units subcu  q.h.s.  2. Albuterol two puffs p.r.n.   ALLERGIES:  No known drug allergies.   FAMILY HISTORY:  Significant for heart disease in her mother who passed away  in December 16, 2004 following heart troubles and kidney failure.  She is not  aware of her father's medical illness.  The patient's sister has diabetes  mellitus.   SOCIAL HISTORY:  The patient occasionally drinks alcohol, does not smoke  cigarettes or use illicit drugs.  She is currently unemployed.  She dropped  out of high school at 11th grade.   PHYSICAL EXAMINATION:  VITAL SIGNS:  Temperature 97.4, blood pressure  128/89, pulse 73, respiratory rate 18, O2 saturations of 99% on room air.  GENERAL:  The patient is very comfortable.  HEENT:  Normocephalic and atraumatic head.  Pupils equal, round, and  reactive to light.  Extraocular muscles intact.  No oral thrush. Dry oral  mucosa.  No elevated JVD and no carotid bruits.  LUNGS:  Clear clinically to auscultation.  CARDIOVASCULAR:  S1 and  S2 regular.  No murmurs, rubs, or gallops.  ABDOMEN:  Obese, soft, and nontender.  No palpable organomegaly.  Bowel  sounds present.  EXTREMITIES:  NO pedal edema or calf tenderness.  Dorsalis pedis pulses 2+  bilaterally.  NEUROLOGY:  No focal neurologic deficit.   LABORATORY DATA:  Urine hCG was negative pregnancy test.  Urinalysis  significant for specific gravity of 1.033, urine glucose greater than 100,  protein negative.  Creatinine 0.7, sodium 124, potassium 4.0, chloride 92,  glucose greater than 700, BUN 7, bicarb 19.  This was on ISTAT.  Venous pH  was 7.359.  Serum acetone was negative.  Follow-up BMET done at 2100 showed  sodium 123, potassium 3.9, chloride 87, CO2 22, glucose 958, creatinine 1.1,  BUN 7, calcium 9.1.   ASSESSMENT:  Ms. Pfisterer is a 30 year old African-American female presenting  with nausea and vomiting which is thought to be secondary to food poisoning.  This appears to be resolving.  In addition, she was  noted to have  uncontrolled diabetes mellitus with a blood glucose of 958.  She is being  admitted for blood glucose control.   1. Nausea and vomiting secondary to food poisoning, resolving.  2. Hyperglycemia.  3. Diabetes mellitus type 2.  4. Noncompliance.  5. Obesity.  6. Pseudohyponatremia secondary to hyperglycemia.  7. History of hyperlipidemia.   PLAN:  Admit to regular floor.  IV fluids normal saline at 150 mL per hour.  Continue Glucomander.  Check hemoglobin A1C and fasting lipid profile.  Phenergan 12.5 mg IV q.4 hours p.r.n. for nausea and vomiting.  She will  need diabetic counseling again and I will ask social worker to see her for  discharge planning to [facility] and ability to obtain medications.      Mobolaji B. Maia Petties, M.D.  Electronically Signed     MBB/MEDQ  D:  10/04/2005  T:  10/04/2005  Job:  [id number]

## 2010-06-10 ENCOUNTER — Emergency Department (HOSPITAL_COMMUNITY)
Admission: EM | Admit: 2010-06-10 | Discharge: 2010-06-10 | Disposition: A | Discharge status: Home / Self Care | Payer: Self-pay | Attending: Emergency Medicine | Admitting: Emergency Medicine

## 2010-06-10 DIAGNOSIS — N390 Urinary tract infection, site not specified: Secondary | ICD-10-CM | POA: Insufficient documentation

## 2010-06-10 DIAGNOSIS — IMO0001 Reserved for inherently not codable concepts without codable children: Secondary | ICD-10-CM | POA: Insufficient documentation

## 2010-06-10 DIAGNOSIS — Z79899 Other long term (current) drug therapy: Secondary | ICD-10-CM | POA: Insufficient documentation

## 2010-06-10 DIAGNOSIS — J45909 Unspecified asthma, uncomplicated: Secondary | ICD-10-CM | POA: Insufficient documentation

## 2010-06-10 DIAGNOSIS — Z794 Long term (current) use of insulin: Secondary | ICD-10-CM | POA: Insufficient documentation

## 2010-06-10 DIAGNOSIS — R05 Cough: Secondary | ICD-10-CM | POA: Insufficient documentation

## 2010-06-10 DIAGNOSIS — F341 Dysthymic disorder: Secondary | ICD-10-CM | POA: Insufficient documentation

## 2010-06-10 DIAGNOSIS — J3489 Other specified disorders of nose and nasal sinuses: Secondary | ICD-10-CM | POA: Insufficient documentation

## 2010-06-10 DIAGNOSIS — R5381 Other malaise: Secondary | ICD-10-CM | POA: Insufficient documentation

## 2010-06-10 DIAGNOSIS — J069 Acute upper respiratory infection, unspecified: Secondary | ICD-10-CM | POA: Insufficient documentation

## 2010-06-10 DIAGNOSIS — M1990 Unspecified osteoarthritis, unspecified site: Secondary | ICD-10-CM | POA: Insufficient documentation

## 2010-06-10 DIAGNOSIS — R350 Frequency of micturition: Secondary | ICD-10-CM | POA: Insufficient documentation

## 2010-06-10 DIAGNOSIS — E119 Type 2 diabetes mellitus without complications: Secondary | ICD-10-CM | POA: Insufficient documentation

## 2010-06-10 DIAGNOSIS — R059 Cough, unspecified: Secondary | ICD-10-CM | POA: Insufficient documentation

## 2010-06-10 LAB — BASIC METABOLIC PANEL
Chloride: 101 mEq/L (ref 96–112)
Creatinine, Ser: 0.94 mg/dL (ref 0.4–1.2)
GFR calc Af Amer: >60 mL/min (ref >60)
Potassium: 3.9 mEq/L (ref 3.5–5.1)
Sodium: 137 mEq/L (ref 135–145)

## 2010-06-10 LAB — DIFFERENTIAL
Basophils Absolute: 0 10*3/uL (ref 0.0–0.1)
Eosinophils Absolute: 0.4 10*3/uL (ref 0.0–0.7)
Eosinophils Relative: 5 % (ref 0–5)
Lymphocytes Relative: 43 % (ref 12–46)
Lymphs Abs: 3.4 10*3/uL (ref 0.7–4.0)
Monocytes Absolute: 0.6 10*3/uL (ref 0.1–1.0)

## 2010-06-10 LAB — CBC
HCT: 30.9 % — ABNORMAL LOW (ref 36.0–46.0)
MCHC: 32.7 g/dL (ref 30.0–36.0)
MCV: 75.2 fL — ABNORMAL LOW (ref 78.0–100.0)
Platelets: 239 10*3/uL (ref 150–400)
RDW: 13.7 % (ref 11.5–15.5)

## 2010-06-10 LAB — GLUCOSE, CAPILLARY: Glucose-Capillary: 229 mg/dL — ABNORMAL HIGH (ref 70–99)

## 2010-06-10 LAB — URINALYSIS, ROUTINE W REFLEX MICROSCOPIC
Bilirubin Urine: NEGATIVE
Hgb urine dipstick: NEGATIVE
Ketones, ur: NEGATIVE mg/dL
Protein, ur: NEGATIVE mg/dL
Urobilinogen, UA: 0.2 mg/dL (ref 0.0–1.0)

## 2010-06-10 LAB — URINE MICROSCOPIC-ADD ON

## 2010-06-21 ENCOUNTER — Inpatient Hospital Stay (HOSPITAL_COMMUNITY)
Admission: EM | Admit: 2010-06-21 | Discharge: 2010-07-05 | DRG: 391 | Disposition: A | Discharge status: Home / Self Care | Payer: Self-pay | Attending: Internal Medicine | Admitting: Internal Medicine

## 2010-06-21 DIAGNOSIS — K449 Diaphragmatic hernia without obstruction or gangrene: Secondary | ICD-10-CM | POA: Diagnosis present

## 2010-06-21 DIAGNOSIS — E876 Hypokalemia: Secondary | ICD-10-CM | POA: Diagnosis not present

## 2010-06-21 DIAGNOSIS — R112 Nausea with vomiting, unspecified: Principal | ICD-10-CM | POA: Diagnosis present

## 2010-06-21 DIAGNOSIS — J45909 Unspecified asthma, uncomplicated: Secondary | ICD-10-CM | POA: Diagnosis present

## 2010-06-21 DIAGNOSIS — N179 Acute kidney failure, unspecified: Secondary | ICD-10-CM | POA: Diagnosis present

## 2010-06-21 DIAGNOSIS — Z9119 Patient's noncompliance with other medical treatment and regimen: Secondary | ICD-10-CM

## 2010-06-21 DIAGNOSIS — Z91199 Patient's noncompliance with other medical treatment and regimen due to unspecified reason: Secondary | ICD-10-CM

## 2010-06-21 DIAGNOSIS — J189 Pneumonia, unspecified organism: Secondary | ICD-10-CM | POA: Diagnosis not present

## 2010-06-21 DIAGNOSIS — A498 Other bacterial infections of unspecified site: Secondary | ICD-10-CM | POA: Diagnosis present

## 2010-06-21 DIAGNOSIS — K3184 Gastroparesis: Secondary | ICD-10-CM | POA: Diagnosis present

## 2010-06-21 DIAGNOSIS — N390 Urinary tract infection, site not specified: Secondary | ICD-10-CM | POA: Diagnosis present

## 2010-06-21 DIAGNOSIS — K2940 Chronic atrophic gastritis without bleeding: Secondary | ICD-10-CM | POA: Diagnosis present

## 2010-06-21 DIAGNOSIS — E1149 Type 2 diabetes mellitus with other diabetic neurological complication: Secondary | ICD-10-CM | POA: Diagnosis present

## 2010-06-21 DIAGNOSIS — D509 Iron deficiency anemia, unspecified: Secondary | ICD-10-CM | POA: Diagnosis present

## 2010-06-21 DIAGNOSIS — I10 Essential (primary) hypertension: Secondary | ICD-10-CM | POA: Diagnosis present

## 2010-06-21 DIAGNOSIS — F341 Dysthymic disorder: Secondary | ICD-10-CM | POA: Diagnosis present

## 2010-06-21 DIAGNOSIS — Z794 Long term (current) use of insulin: Secondary | ICD-10-CM

## 2010-06-21 DIAGNOSIS — E871 Hypo-osmolality and hyponatremia: Secondary | ICD-10-CM | POA: Diagnosis present

## 2010-06-21 LAB — URINALYSIS, ROUTINE W REFLEX MICROSCOPIC
Bilirubin Urine: NEGATIVE
Glucose, UA: >1000 mg/dL — AB
Ketones, ur: NEGATIVE mg/dL
Protein, ur: 30 mg/dL — AB

## 2010-06-21 LAB — COMPREHENSIVE METABOLIC PANEL
AST: 23 U/L (ref 0–37)
Albumin: 3 g/dL — ABNORMAL LOW (ref 3.5–5.2)
Alkaline Phosphatase: 98 U/L (ref 39–117)
BUN: 16 mg/dL (ref 6–23)
Chloride: 95 mEq/L — ABNORMAL LOW (ref 96–112)
Potassium: 4.3 mEq/L (ref 3.5–5.1)
Total Bilirubin: 0.5 mg/dL (ref 0.3–1.2)

## 2010-06-21 LAB — URINE MICROSCOPIC-ADD ON

## 2010-06-21 LAB — CBC
MCV: 75.4 fL — ABNORMAL LOW (ref 78.0–100.0)
Platelets: 209 10*3/uL (ref 150–400)
RBC: 4.19 MIL/uL (ref 3.87–5.11)
WBC: 15.2 10*3/uL — ABNORMAL HIGH (ref 4.0–10.5)

## 2010-06-21 LAB — LIPID PANEL
HDL: 51 mg/dL (ref >39)
Triglycerides: 98 mg/dL (ref <150)
VLDL: 20 mg/dL (ref 0–40)

## 2010-06-21 LAB — DIFFERENTIAL
Eosinophils Absolute: 0 10*3/uL (ref 0.0–0.7)
Lymphocytes Relative: 6 % — ABNORMAL LOW (ref 12–46)
Lymphs Abs: 0.9 10*3/uL (ref 0.7–4.0)
Neutro Abs: 13.2 10*3/uL — ABNORMAL HIGH (ref 1.7–7.7)
Neutrophils Relative %: 87 % — ABNORMAL HIGH (ref 43–77)

## 2010-06-21 LAB — GLUCOSE, CAPILLARY
Glucose-Capillary: 485 mg/dL — ABNORMAL HIGH (ref 70–99)
Glucose-Capillary: 332 mg/dL — ABNORMAL HIGH (ref 70–99)
Glucose-Capillary: 171 mg/dL — ABNORMAL HIGH (ref 70–99)
Glucose-Capillary: 227 mg/dL — ABNORMAL HIGH (ref 70–99)

## 2010-06-21 LAB — PROTIME-INR
INR: 1.16 (ref 0.00–1.49)
Prothrombin Time: 15 seconds (ref 11.6–15.2)

## 2010-06-22 ENCOUNTER — Inpatient Hospital Stay (HOSPITAL_COMMUNITY): Payer: Self-pay

## 2010-06-22 LAB — CBC
HCT: 28 % — ABNORMAL LOW (ref 36.0–46.0)
MCV: 76.3 fL — ABNORMAL LOW (ref 78.0–100.0)
Platelets: 161 10*3/uL (ref 150–400)
RBC: 3.67 MIL/uL — ABNORMAL LOW (ref 3.87–5.11)
WBC: 11.4 10*3/uL — ABNORMAL HIGH (ref 4.0–10.5)

## 2010-06-22 LAB — RAPID URINE DRUG SCREEN, HOSP PERFORMED
Amphetamines: NOT DETECTED
Barbiturates: NOT DETECTED
Benzodiazepines: NOT DETECTED
Cocaine: POSITIVE — AB
Opiates: POSITIVE — AB
Tetrahydrocannabinol: POSITIVE — AB

## 2010-06-22 LAB — IRON AND TIBC
Iron: <10 ug/dL — ABNORMAL LOW (ref 42–135)
UIBC: 255 ug/dL

## 2010-06-22 LAB — HEMOGLOBIN A1C: Hgb A1c MFr Bld: 11.8 % — ABNORMAL HIGH (ref <5.7)

## 2010-06-22 LAB — PROLACTIN: Prolactin: 55.2 ng/mL

## 2010-06-22 LAB — GLUCOSE, CAPILLARY
Glucose-Capillary: 198 mg/dL — ABNORMAL HIGH (ref 70–99)
Glucose-Capillary: 211 mg/dL — ABNORMAL HIGH (ref 70–99)
Glucose-Capillary: 294 mg/dL — ABNORMAL HIGH (ref 70–99)

## 2010-06-22 LAB — BASIC METABOLIC PANEL
BUN: 13 mg/dL (ref 6–23)
Chloride: 102 mEq/L (ref 96–112)
Glucose, Bld: 226 mg/dL — ABNORMAL HIGH (ref 70–99)
Potassium: 3.8 mEq/L (ref 3.5–5.1)

## 2010-06-22 LAB — PROCALCITONIN: Procalcitonin: 4.91 ng/mL

## 2010-06-23 LAB — CBC
HCT: 25.6 % — ABNORMAL LOW (ref 36.0–46.0)
MCH: 23.9 pg — ABNORMAL LOW (ref 26.0–34.0)
MCHC: 32 g/dL (ref 30.0–36.0)
MCV: 74.6 fL — ABNORMAL LOW (ref 78.0–100.0)
RDW: 14.1 % (ref 11.5–15.5)

## 2010-06-23 LAB — BASIC METABOLIC PANEL
BUN: 9 mg/dL (ref 6–23)
CO2: 27 mEq/L (ref 19–32)
Calcium: 8.3 mg/dL — ABNORMAL LOW (ref 8.4–10.5)
Creatinine, Ser: 1.08 mg/dL (ref 0.4–1.2)
GFR calc non Af Amer: 59 mL/min — ABNORMAL LOW (ref >60)
Glucose, Bld: 242 mg/dL — ABNORMAL HIGH (ref 70–99)

## 2010-06-23 LAB — DIFFERENTIAL
Eosinophils Relative: 0 % (ref 0–5)
Lymphs Abs: 0.7 10*3/uL (ref 0.7–4.0)
Monocytes Absolute: 0.3 10*3/uL (ref 0.1–1.0)
Neutro Abs: 3.4 10*3/uL (ref 1.7–7.7)

## 2010-06-23 LAB — GLUCOSE, CAPILLARY
Glucose-Capillary: 210 mg/dL — ABNORMAL HIGH (ref 70–99)
Glucose-Capillary: 232 mg/dL — ABNORMAL HIGH (ref 70–99)

## 2010-06-24 ENCOUNTER — Inpatient Hospital Stay (HOSPITAL_COMMUNITY): Payer: Self-pay

## 2010-06-24 DIAGNOSIS — M79609 Pain in unspecified limb: Secondary | ICD-10-CM

## 2010-06-24 LAB — BASIC METABOLIC PANEL
BUN: 5 mg/dL — ABNORMAL LOW (ref 6–23)
CO2: 26 mEq/L (ref 19–32)
Calcium: 8.2 mg/dL — ABNORMAL LOW (ref 8.4–10.5)
Chloride: 105 mEq/L (ref 96–112)
Creatinine, Ser: 0.91 mg/dL (ref 0.4–1.2)
GFR calc Af Amer: >60 mL/min (ref >60)

## 2010-06-24 LAB — DIFFERENTIAL
Basophils Absolute: 0 10*3/uL (ref 0.0–0.1)
Basophils Relative: 0 % (ref 0–1)
Eosinophils Absolute: 0 10*3/uL (ref 0.0–0.7)
Lymphs Abs: 1.4 10*3/uL (ref 0.7–4.0)
Neutro Abs: 3 10*3/uL (ref 1.7–7.7)

## 2010-06-24 LAB — CBC
MCH: 24.8 pg — ABNORMAL LOW (ref 26.0–34.0)
MCHC: 33.3 g/dL (ref 30.0–36.0)
MCV: 74.3 fL — ABNORMAL LOW (ref 78.0–100.0)
Platelets: 206 10*3/uL (ref 150–400)
RBC: 3.43 MIL/uL — ABNORMAL LOW (ref 3.87–5.11)

## 2010-06-24 LAB — URINE CULTURE: Culture  Setup Time: 201206040016

## 2010-06-24 LAB — GLUCOSE, CAPILLARY
Glucose-Capillary: 169 mg/dL — ABNORMAL HIGH (ref 70–99)
Glucose-Capillary: 129 mg/dL — ABNORMAL HIGH (ref 70–99)
Glucose-Capillary: 147 mg/dL — ABNORMAL HIGH (ref 70–99)

## 2010-06-24 LAB — MAGNESIUM: Magnesium: 2.1 mg/dL (ref 1.5–2.5)

## 2010-06-24 LAB — PHOSPHORUS: Phosphorus: 2.9 mg/dL (ref 2.3–4.6)

## 2010-06-25 ENCOUNTER — Inpatient Hospital Stay (HOSPITAL_COMMUNITY): Payer: Self-pay

## 2010-06-25 LAB — BASIC METABOLIC PANEL
CO2: 25 mEq/L (ref 19–32)
Chloride: 103 mEq/L (ref 96–112)
GFR calc Af Amer: >60 mL/min (ref >60)
Potassium: 3.3 mEq/L — ABNORMAL LOW (ref 3.5–5.1)

## 2010-06-25 LAB — GLUCOSE, CAPILLARY
Glucose-Capillary: 131 mg/dL — ABNORMAL HIGH (ref 70–99)
Glucose-Capillary: 143 mg/dL — ABNORMAL HIGH (ref 70–99)
Glucose-Capillary: 129 mg/dL — ABNORMAL HIGH (ref 70–99)
Glucose-Capillary: 167 mg/dL — ABNORMAL HIGH (ref 70–99)

## 2010-06-25 LAB — CBC
Hemoglobin: 8.7 g/dL — ABNORMAL LOW (ref 12.0–15.0)
Platelets: 228 10*3/uL (ref 150–400)
RBC: 3.59 MIL/uL — ABNORMAL LOW (ref 3.87–5.11)
WBC: 6.5 10*3/uL (ref 4.0–10.5)

## 2010-06-26 LAB — BASIC METABOLIC PANEL
BUN: 3 mg/dL — ABNORMAL LOW (ref 6–23)
CO2: 26 mEq/L (ref 19–32)
Chloride: 102 mEq/L (ref 96–112)
Creatinine, Ser: 0.87 mg/dL (ref 0.4–1.2)
GFR calc Af Amer: >60 mL/min (ref >60)
Glucose, Bld: 120 mg/dL — ABNORMAL HIGH (ref 70–99)

## 2010-06-26 LAB — GLUCOSE, CAPILLARY
Glucose-Capillary: 113 mg/dL — ABNORMAL HIGH (ref 70–99)
Glucose-Capillary: 177 mg/dL — ABNORMAL HIGH (ref 70–99)

## 2010-06-27 ENCOUNTER — Inpatient Hospital Stay (HOSPITAL_COMMUNITY): Payer: Self-pay

## 2010-06-27 LAB — GLUCOSE, CAPILLARY
Glucose-Capillary: 99 mg/dL (ref 70–99)
Glucose-Capillary: 152 mg/dL — ABNORMAL HIGH (ref 70–99)

## 2010-06-28 ENCOUNTER — Inpatient Hospital Stay (HOSPITAL_COMMUNITY): Payer: Self-pay

## 2010-06-28 LAB — BASIC METABOLIC PANEL
BUN: 13 mg/dL (ref 6–23)
GFR calc Af Amer: 23 mL/min — ABNORMAL LOW (ref >60)
GFR calc non Af Amer: 19 mL/min — ABNORMAL LOW (ref >60)
Potassium: 3.3 mEq/L — ABNORMAL LOW (ref 3.5–5.1)
Sodium: 144 mEq/L (ref 135–145)

## 2010-06-28 LAB — CULTURE, BLOOD (ROUTINE X 2): Culture  Setup Time: 201206042107

## 2010-06-28 LAB — GLUCOSE, CAPILLARY
Glucose-Capillary: 128 mg/dL — ABNORMAL HIGH (ref 70–99)
Glucose-Capillary: 109 mg/dL — ABNORMAL HIGH (ref 70–99)
Glucose-Capillary: 115 mg/dL — ABNORMAL HIGH (ref 70–99)

## 2010-06-28 LAB — CBC
MCHC: 33.2 g/dL (ref 30.0–36.0)
Platelets: 348 10*3/uL (ref 150–400)
RDW: 14.6 % (ref 11.5–15.5)

## 2010-06-28 LAB — URINALYSIS, ROUTINE W REFLEX MICROSCOPIC
Hgb urine dipstick: NEGATIVE
Nitrite: NEGATIVE
Protein, ur: NEGATIVE mg/dL
Urobilinogen, UA: 0.2 mg/dL (ref 0.0–1.0)

## 2010-06-29 ENCOUNTER — Inpatient Hospital Stay (HOSPITAL_COMMUNITY): Payer: Self-pay

## 2010-06-29 LAB — CBC
Hemoglobin: 9.5 g/dL — ABNORMAL LOW (ref 12.0–15.0)
MCH: 23.9 pg — ABNORMAL LOW (ref 26.0–34.0)
RBC: 3.97 MIL/uL (ref 3.87–5.11)

## 2010-06-29 LAB — BASIC METABOLIC PANEL
CO2: 21 mEq/L (ref 19–32)
Calcium: 8.7 mg/dL (ref 8.4–10.5)
Glucose, Bld: 126 mg/dL — ABNORMAL HIGH (ref 70–99)
Potassium: 3.5 mEq/L (ref 3.5–5.1)
Sodium: 138 mEq/L (ref 135–145)

## 2010-06-29 LAB — VANCOMYCIN, RANDOM: Vancomycin Rm: 12.1 ug/mL

## 2010-06-29 LAB — GLUCOSE, CAPILLARY
Glucose-Capillary: 114 mg/dL — ABNORMAL HIGH (ref 70–99)
Glucose-Capillary: 115 mg/dL — ABNORMAL HIGH (ref 70–99)

## 2010-06-29 LAB — PHOSPHORUS: Phosphorus: 3.4 mg/dL (ref 2.3–4.6)

## 2010-06-30 ENCOUNTER — Other Ambulatory Visit: Payer: Self-pay | Admitting: Internal Medicine

## 2010-06-30 LAB — CBC
Hemoglobin: 9.1 g/dL — ABNORMAL LOW (ref 12.0–15.0)
MCH: 23.8 pg — ABNORMAL LOW (ref 26.0–34.0)
MCHC: 32 g/dL (ref 30.0–36.0)
MCV: 74.2 fL — ABNORMAL LOW (ref 78.0–100.0)
RBC: 3.83 MIL/uL — ABNORMAL LOW (ref 3.87–5.11)

## 2010-06-30 LAB — DIFFERENTIAL
Basophils Absolute: 0 10*3/uL (ref 0.0–0.1)
Basophils Relative: 0 % (ref 0–1)
Lymphs Abs: 2.1 10*3/uL (ref 0.7–4.0)
Monocytes Relative: 8 % (ref 3–12)
Neutro Abs: 5.1 10*3/uL (ref 1.7–7.7)

## 2010-06-30 LAB — GLUCOSE, CAPILLARY: Glucose-Capillary: 106 mg/dL — ABNORMAL HIGH (ref 70–99)

## 2010-06-30 LAB — BASIC METABOLIC PANEL
BUN: 18 mg/dL (ref 6–23)
CO2: 23 mEq/L (ref 19–32)
Calcium: 8.7 mg/dL (ref 8.4–10.5)
Creatinine, Ser: 2.6 mg/dL — ABNORMAL HIGH (ref 0.4–1.2)
GFR calc non Af Amer: 21 mL/min — ABNORMAL LOW (ref >60)
Glucose, Bld: 92 mg/dL (ref 70–99)
Sodium: 139 mEq/L (ref 135–145)

## 2010-07-01 ENCOUNTER — Other Ambulatory Visit: Payer: Self-pay | Admitting: Gastroenterology

## 2010-07-01 LAB — RENAL FUNCTION PANEL
Albumin: 2.4 g/dL — ABNORMAL LOW (ref 3.5–5.2)
CO2: 23 mEq/L (ref 19–32)
Calcium: 8.6 mg/dL (ref 8.4–10.5)
Chloride: 105 mEq/L (ref 96–112)
Creatinine, Ser: 2.43 mg/dL — ABNORMAL HIGH (ref 0.4–1.2)
Sodium: 140 mEq/L (ref 135–145)

## 2010-07-01 LAB — GLUCOSE, CAPILLARY
Glucose-Capillary: 123 mg/dL — ABNORMAL HIGH (ref 70–99)
Glucose-Capillary: 132 mg/dL — ABNORMAL HIGH (ref 70–99)
Glucose-Capillary: 135 mg/dL — ABNORMAL HIGH (ref 70–99)
Glucose-Capillary: 140 mg/dL — ABNORMAL HIGH (ref 70–99)
Glucose-Capillary: 149 mg/dL — ABNORMAL HIGH (ref 70–99)

## 2010-07-01 LAB — CBC
MCV: 74.3 fL — ABNORMAL LOW (ref 78.0–100.0)
Platelets: 354 10*3/uL (ref 150–400)
RBC: 3.7 MIL/uL — ABNORMAL LOW (ref 3.87–5.11)
RDW: 14.7 % (ref 11.5–15.5)
WBC: 8.7 10*3/uL (ref 4.0–10.5)

## 2010-07-01 LAB — URINE MICROSCOPIC-ADD ON

## 2010-07-01 LAB — URINALYSIS, ROUTINE W REFLEX MICROSCOPIC
Glucose, UA: NEGATIVE mg/dL
Hgb urine dipstick: NEGATIVE
Protein, ur: NEGATIVE mg/dL
Specific Gravity, Urine: 1.01 (ref 1.005–1.030)
pH: 6 (ref 5.0–8.0)

## 2010-07-01 LAB — CULTURE, BLOOD (ROUTINE X 2): Culture: NO GROWTH

## 2010-07-02 LAB — RENAL FUNCTION PANEL
Albumin: 2.3 g/dL — ABNORMAL LOW (ref 3.5–5.2)
BUN: 11 mg/dL (ref 6–23)
Calcium: 8.6 mg/dL (ref 8.4–10.5)
Creatinine, Ser: 2.27 mg/dL — ABNORMAL HIGH (ref 0.4–1.2)
Phosphorus: 2.6 mg/dL (ref 2.3–4.6)
Potassium: 3.7 mEq/L (ref 3.5–5.1)

## 2010-07-02 LAB — GLUCOSE, CAPILLARY
Glucose-Capillary: 156 mg/dL — ABNORMAL HIGH (ref 70–99)
Glucose-Capillary: 151 mg/dL — ABNORMAL HIGH (ref 70–99)

## 2010-07-02 LAB — CBC
HCT: 26.6 % — ABNORMAL LOW (ref 36.0–46.0)
MCH: 24.1 pg — ABNORMAL LOW (ref 26.0–34.0)
MCHC: 32.7 g/dL (ref 30.0–36.0)
MCV: 73.7 fL — ABNORMAL LOW (ref 78.0–100.0)
RDW: 14.6 % (ref 11.5–15.5)

## 2010-07-03 ENCOUNTER — Inpatient Hospital Stay (HOSPITAL_COMMUNITY): Payer: Self-pay

## 2010-07-03 LAB — RENAL FUNCTION PANEL
CO2: 22 mEq/L (ref 19–32)
Calcium: 8.8 mg/dL (ref 8.4–10.5)
Chloride: 109 mEq/L (ref 96–112)
Creatinine, Ser: 2.21 mg/dL — ABNORMAL HIGH (ref 0.50–1.10)
GFR calc non Af Amer: 26 mL/min — ABNORMAL LOW (ref >60)
Glucose, Bld: 130 mg/dL — ABNORMAL HIGH (ref 70–99)

## 2010-07-03 LAB — CBC
HCT: 27.2 % — ABNORMAL LOW (ref 36.0–46.0)
Hemoglobin: 9 g/dL — ABNORMAL LOW (ref 12.0–15.0)
MCH: 24.4 pg — ABNORMAL LOW (ref 26.0–34.0)
MCV: 73.7 fL — ABNORMAL LOW (ref 78.0–100.0)
RBC: 3.69 MIL/uL — ABNORMAL LOW (ref 3.87–5.11)
WBC: 6.4 10*3/uL (ref 4.0–10.5)

## 2010-07-03 LAB — GLUCOSE, CAPILLARY
Glucose-Capillary: 149 mg/dL — ABNORMAL HIGH (ref 70–99)
Glucose-Capillary: 159 mg/dL — ABNORMAL HIGH (ref 70–99)
Glucose-Capillary: 156 mg/dL — ABNORMAL HIGH (ref 70–99)

## 2010-07-03 MED ORDER — TECHNETIUM TC 99M SULFUR COLLOID
2.0000 | Freq: Once | INTRAVENOUS | Status: AC | PRN
  Administered 2010-07-03: 2 via INTRAVENOUS

## 2010-07-04 LAB — BASIC METABOLIC PANEL
CO2: 25 mEq/L (ref 19–32)
Calcium: 8.8 mg/dL (ref 8.4–10.5)
Chloride: 106 mEq/L (ref 96–112)
Creatinine, Ser: 2.21 mg/dL — ABNORMAL HIGH (ref 0.50–1.10)
GFR calc Af Amer: 31 mL/min — ABNORMAL LOW (ref >60)
Sodium: 137 mEq/L (ref 135–145)

## 2010-07-04 LAB — GLUCOSE, CAPILLARY: Glucose-Capillary: 139 mg/dL — ABNORMAL HIGH (ref 70–99)

## 2010-07-05 LAB — BASIC METABOLIC PANEL
Calcium: 8.9 mg/dL (ref 8.4–10.5)
GFR calc Af Amer: 31 mL/min — ABNORMAL LOW (ref >60)
GFR calc non Af Amer: 25 mL/min — ABNORMAL LOW (ref >60)
Potassium: 4.3 mEq/L (ref 3.5–5.1)
Sodium: 136 mEq/L (ref 135–145)

## 2010-07-05 LAB — GLUCOSE, CAPILLARY
Glucose-Capillary: 124 mg/dL — ABNORMAL HIGH (ref 70–99)
Glucose-Capillary: 149 mg/dL — ABNORMAL HIGH (ref 70–99)

## 2010-07-07 ENCOUNTER — Inpatient Hospital Stay (HOSPITAL_COMMUNITY)
Admission: AD | Admit: 2010-07-07 | Discharge: 2010-07-08 | Disposition: A | Discharge status: Nursing Home (Includes ICF) | Payer: Self-pay | Source: Ambulatory Visit | Attending: Obstetrics & Gynecology | Admitting: Obstetrics & Gynecology

## 2010-07-07 DIAGNOSIS — Z79899 Other long term (current) drug therapy: Secondary | ICD-10-CM | POA: Insufficient documentation

## 2010-07-07 DIAGNOSIS — F3289 Other specified depressive episodes: Secondary | ICD-10-CM | POA: Insufficient documentation

## 2010-07-07 DIAGNOSIS — J45909 Unspecified asthma, uncomplicated: Secondary | ICD-10-CM | POA: Insufficient documentation

## 2010-07-07 DIAGNOSIS — E119 Type 2 diabetes mellitus without complications: Secondary | ICD-10-CM | POA: Insufficient documentation

## 2010-07-07 DIAGNOSIS — R1115 Cyclical vomiting syndrome unrelated to migraine: Secondary | ICD-10-CM | POA: Insufficient documentation

## 2010-07-07 DIAGNOSIS — Z794 Long term (current) use of insulin: Secondary | ICD-10-CM | POA: Insufficient documentation

## 2010-07-07 DIAGNOSIS — F411 Generalized anxiety disorder: Secondary | ICD-10-CM | POA: Insufficient documentation

## 2010-07-07 DIAGNOSIS — F329 Major depressive disorder, single episode, unspecified: Secondary | ICD-10-CM | POA: Insufficient documentation

## 2010-07-07 DIAGNOSIS — M1990 Unspecified osteoarthritis, unspecified site: Secondary | ICD-10-CM | POA: Insufficient documentation

## 2010-07-07 LAB — URINE MICROSCOPIC-ADD ON

## 2010-07-07 LAB — URINALYSIS, ROUTINE W REFLEX MICROSCOPIC
Bilirubin Urine: NEGATIVE
Glucose, UA: NEGATIVE mg/dL
Specific Gravity, Urine: >1.03 — ABNORMAL HIGH (ref 1.005–1.030)
pH: 5.5 (ref 5.0–8.0)

## 2010-07-08 ENCOUNTER — Inpatient Hospital Stay (HOSPITAL_COMMUNITY)
Admission: EM | Admit: 2010-07-08 | Discharge: 2010-07-10 | DRG: 690 | Disposition: A | Discharge status: Home / Self Care | Payer: Self-pay | Attending: Internal Medicine | Admitting: Internal Medicine

## 2010-07-08 ENCOUNTER — Inpatient Hospital Stay (HOSPITAL_COMMUNITY): Payer: Self-pay

## 2010-07-08 DIAGNOSIS — I10 Essential (primary) hypertension: Secondary | ICD-10-CM | POA: Diagnosis present

## 2010-07-08 DIAGNOSIS — Z794 Long term (current) use of insulin: Secondary | ICD-10-CM

## 2010-07-08 DIAGNOSIS — N179 Acute kidney failure, unspecified: Secondary | ICD-10-CM | POA: Diagnosis present

## 2010-07-08 DIAGNOSIS — R112 Nausea with vomiting, unspecified: Secondary | ICD-10-CM | POA: Diagnosis present

## 2010-07-08 DIAGNOSIS — A498 Other bacterial infections of unspecified site: Secondary | ICD-10-CM | POA: Diagnosis present

## 2010-07-08 DIAGNOSIS — M1990 Unspecified osteoarthritis, unspecified site: Secondary | ICD-10-CM | POA: Diagnosis present

## 2010-07-08 DIAGNOSIS — D509 Iron deficiency anemia, unspecified: Secondary | ICD-10-CM | POA: Diagnosis present

## 2010-07-08 DIAGNOSIS — J45909 Unspecified asthma, uncomplicated: Secondary | ICD-10-CM | POA: Diagnosis present

## 2010-07-08 DIAGNOSIS — F411 Generalized anxiety disorder: Secondary | ICD-10-CM | POA: Diagnosis present

## 2010-07-08 DIAGNOSIS — F1410 Cocaine abuse, uncomplicated: Secondary | ICD-10-CM | POA: Diagnosis present

## 2010-07-08 DIAGNOSIS — I498 Other specified cardiac arrhythmias: Secondary | ICD-10-CM | POA: Diagnosis not present

## 2010-07-08 DIAGNOSIS — E119 Type 2 diabetes mellitus without complications: Secondary | ICD-10-CM | POA: Diagnosis present

## 2010-07-08 DIAGNOSIS — F339 Major depressive disorder, recurrent, unspecified: Secondary | ICD-10-CM | POA: Diagnosis present

## 2010-07-08 DIAGNOSIS — D638 Anemia in other chronic diseases classified elsewhere: Secondary | ICD-10-CM | POA: Diagnosis present

## 2010-07-08 DIAGNOSIS — K219 Gastro-esophageal reflux disease without esophagitis: Secondary | ICD-10-CM | POA: Diagnosis present

## 2010-07-08 DIAGNOSIS — N390 Urinary tract infection, site not specified: Principal | ICD-10-CM | POA: Diagnosis present

## 2010-07-08 LAB — DIFFERENTIAL
Basophils Absolute: 0.4 10*3/uL — ABNORMAL HIGH (ref 0.0–0.1)
Basophils Relative: 6 % — ABNORMAL HIGH (ref 0–1)
Eosinophils Absolute: 0.1 10*3/uL (ref 0.0–0.7)
Eosinophils Relative: 1 % (ref 0–5)
Metamyelocytes Relative: 0 %
Monocytes Absolute: 0.3 10*3/uL (ref 0.1–1.0)
Monocytes Relative: 4 % (ref 3–12)
Myelocytes: 0 %
Neutro Abs: 3.3 10*3/uL (ref 1.7–7.7)
Neutrophils Relative %: 49 % (ref 43–77)
nRBC: 0 /100 WBC

## 2010-07-08 LAB — RAPID URINE DRUG SCREEN, HOSP PERFORMED
Cocaine: NOT DETECTED
Opiates: NOT DETECTED
Tetrahydrocannabinol: NOT DETECTED

## 2010-07-08 LAB — CBC
MCH: 23.2 pg — ABNORMAL LOW (ref 26.0–34.0)
MCH: 23.9 pg — ABNORMAL LOW (ref 26.0–34.0)
MCHC: 31.4 g/dL (ref 30.0–36.0)
MCHC: 32.1 g/dL (ref 30.0–36.0)
MCV: 73.8 fL — ABNORMAL LOW (ref 78.0–100.0)
Platelets: 380 10*3/uL (ref 150–400)
RDW: 14.7 % (ref 11.5–15.5)
RDW: 14.9 % (ref 11.5–15.5)

## 2010-07-08 LAB — COMPREHENSIVE METABOLIC PANEL
ALT: 7 U/L (ref 0–35)
Albumin: 3 g/dL — ABNORMAL LOW (ref 3.5–5.2)
Alkaline Phosphatase: 74 U/L (ref 39–117)
Calcium: 9.6 mg/dL (ref 8.4–10.5)
GFR calc Af Amer: 27 mL/min — ABNORMAL LOW (ref >60)
Glucose, Bld: 141 mg/dL — ABNORMAL HIGH (ref 70–99)
Potassium: 3.8 mEq/L (ref 3.5–5.1)
Sodium: 137 mEq/L (ref 135–145)
Total Protein: 8 g/dL (ref 6.0–8.3)

## 2010-07-08 LAB — TSH: TSH: 0.678 u[IU]/mL (ref 0.350–4.500)

## 2010-07-08 LAB — BASIC METABOLIC PANEL
Calcium: 9.6 mg/dL (ref 8.4–10.5)
Creatinine, Ser: 2.36 mg/dL — ABNORMAL HIGH (ref 0.50–1.10)
GFR calc Af Amer: 29 mL/min — ABNORMAL LOW (ref >60)
GFR calc non Af Amer: 24 mL/min — ABNORMAL LOW (ref >60)
Sodium: 139 mEq/L (ref 135–145)

## 2010-07-08 LAB — PHOSPHORUS: Phosphorus: 4.4 mg/dL (ref 2.3–4.6)

## 2010-07-08 LAB — GLUCOSE, CAPILLARY
Glucose-Capillary: 94 mg/dL (ref 70–99)
Glucose-Capillary: 90 mg/dL (ref 70–99)

## 2010-07-09 LAB — GLUCOSE, CAPILLARY
Glucose-Capillary: 86 mg/dL (ref 70–99)
Glucose-Capillary: 91 mg/dL (ref 70–99)
Glucose-Capillary: 170 mg/dL — ABNORMAL HIGH (ref 70–99)

## 2010-07-09 LAB — CBC
HCT: 27.1 % — ABNORMAL LOW (ref 36.0–46.0)
Hemoglobin: 8.4 g/dL — ABNORMAL LOW (ref 12.0–15.0)
MCHC: 31 g/dL (ref 30.0–36.0)
MCV: 74.9 fL — ABNORMAL LOW (ref 78.0–100.0)

## 2010-07-09 LAB — URINE CULTURE

## 2010-07-09 LAB — BASIC METABOLIC PANEL
Calcium: 8.5 mg/dL (ref 8.4–10.5)
Chloride: 107 mEq/L (ref 96–112)
Creatinine, Ser: 2.16 mg/dL — ABNORMAL HIGH (ref 0.50–1.10)
GFR calc Af Amer: 32 mL/min — ABNORMAL LOW (ref >60)
Sodium: 138 mEq/L (ref 135–145)

## 2010-07-09 NOTE — H&P (Signed)
[name], [name]                ACCOUNT NO.:  [id number]  MEDICAL RECORD NO.:  [id number]           PATIENT TYPE:  I  LOCATION:  [id number]                         FACILITY:  [facility]  PHYSICIAN:  Julieta Bellini, MDDATE OF BIRTH:  [birth year]  DATE OF ADMISSION:  06/21/2010 DATE OF DISCHARGE:                             HISTORY & PHYSICAL   PRIMARY CARE PHYSICIAN:  [facility].  CHIEF COMPLAINT:  Nausea, vomiting, general malaise, dysuria.  HISTORY OF PRESENT ILLNESS:  [name] is a 30 year old female with a past medical history significant for diabetes, asthma, depression, and anxiety who came into the hospital with 3-4 days of nausea and vomiting, been unable to keep things down.  The patient reports some chills, fever, and also mild suprapubic pain and dysuria that had been going for the same amount of time.  Of note, the patient had been without any insulin treatment since she ran out about 2 weeks ago and had not been able to get in touch with doctor to refill it.  The patient denies any hematemesis, any chest pain, shortness of breath, changes in her bowel movements, or any other complaints.  In the emergency department, initial evaluation demonstrated urinalysis that suggested UTI.  The patient with tachycardia, most likely secondary to dehydration with a heart rate in the 120-130s sinus and CBGs in the 549, anion gap was 12. Despite initial fluid resuscitation in the ED, the patient remains to be tachycardic.  Her blood sugar improved after receiving insulin and the fluid resuscitation, but she was still complaining of nausea and vomiting and been unable to keep things down.  At that moment, triad hospitalist was called to provide further evaluation and treatment.  ALLERGIES:  There is no known drug allergies.  PAST MEDICAL HISTORY:  Significant for diabetes, currently insulin dependent; depression; anxiety; and asthma.  MEDICATIONS:  Medication reconciliation form  was in the process of being filled out, but per the patient she use albuterol p.r.n., Lexapro, and insulin.  Doses were not provided by the patient at this moment.  SOCIAL HISTORY:  She lives in [place].  She lives by herself with two kids, one boy and one girl.  She denies any alcohol, smoking, or any illicit drugs.  The patient is currently not working.  She is applying for disability.  FAMILY HISTORY:  Father unknown to her.  Mother with congestive heart failure, renal failure, and also history of diabetes.  REVIEW OF SYSTEMS:  The patient is complaining of lower extremity pain and numbness, otherwise as specified on HPI.  PHYSICAL EXAM:  VITAL SIGNS:  Temperature 100.8, heart rate 133, respiratory rate 20, blood pressure 120/76, oxygen saturation 98% on room air. GENERAL:  The patient was lying in bed, mild distress secondary to feeling nauseated and also complaining of pain in her lower extremities. HEENT:  Normocephalic.  No visible trauma.  Eyes, PERRLA.  Extraocular muscles intact.  No icterus.  No nystagmus.  There was no exudates or erythema inside her mouth.  Dry mucous membranes was appreciated. NECK:  Supple.  No bruits.  No thyromegaly. RESPIRATORY:  Clear to auscultation bilaterally.  Currently no wheezing. HEART:  Tachycardic.  No murmurs.  No rubs.  No rales. ABDOMEN:  Soft.  Nondistended.  Positive bowel sounds.  Mild tenderness to deep palpation in the suprapubic region. EXTREMITIES:  No edema.  No cyanosis or clubbing.  Mild discomfort to palpation on her legs all the way from her feet up to her knees. SKIN:  No rash.  No petechiae. NEUROLOGIC:  The patient was alert, awake and oriented x3.  Muscle strength 4/5 secondary to poor effort since the patient was feeling weak and bad in general.  Cranial nerves II through XII grossly intact. There was no specific neurologic deficit.  Normal finger-to-nose.  PERTINENT LABORATORY DATA:  Includes CBC with  differential with white blood cells of 15.2, platelets 209, hemoglobin 10.3, MCV 75.4. Comprehensive metabolic panel showed sodium of 134, potassium 4.3, chloride 95, CO2 of 27, glucose 549, BUN 16, creatinine 1.68, GFR 43. Liver function tests normal except for an albumin of 3.0.  Negative urine pregnancy test.  Urinalysis with small leukocytes, positive nitrite, trace of blood, 30 protein, 21-50 white blood cells, many bacteria.  ASSESSMENT AND PLAN: 1. Nausea and vomiting, low-grade fever, and suprapubic discomfort     most likely secondary to urinary tract infection having urinalysis     that suggest infection.  We are going to get a urine culture.  We     are going to start the patient on Rocephin.  We are going to     provide fluid resuscitation.  We are going to follow her     electrolytes and replete it as needed.  We are going to provide     antiemetics.  We are going to advance her diet slowly to prevent     any associated nausea and vomiting secondary to food intake.  The     patient's nausea and vomiting might be also secondary to the     hyperglycemia and not been having any insulin for the last 2 weeks,     which is going to be covered around the problem number next. 2. Hyperglycemia with a history of diabetes, uncontrolled.  We are     going to check a hemoglobin A1c.  We are going to start the patient     on sliding scale insulin and also on Lantus.  We are going to     adjust her doses depending her CBGs.  The patient will be slowly     advanced to modify carbohydrates diet when she tolerates p.o. and     we are going to ask inpatient diabetes coordinator to provide     education for this patient.  The patient currently is not on DKA or     having any hyperosmolar status.  Anion gap is 12 and the patient's     bicarb is 27.  We will provide fluid resuscitation. 3. Acute renal failure, most likely secondary to dehydration.  We are     going to provide fluid  resuscitation.  We are going to follow the     patient's creatinine trend.  If renal function failed to improve     with this fluid resuscitation treatment, we will escalate to a full     workup for acute renal failure including ultrasound of her kidneys     and urine osmolality. 4. Depression.  We are going to continue home medications.  Mood     currently stable. 5. Anxiety.  We are going to continue p.r.n. lorazepam.  6. Asthma, currently stable.  No wheezing.  We are going to use     albuterol as needed. 7. Anemia.  That appears to be secondary to iron deficiency in a 27-     year-old female that is still menstruating.  We are going to check     an anemia panel just for confirmation and if that is the case, we     are going to provide ferrous sulfate. 8. DVT prophylaxis.  The patient is going to be started on Lovenox.     There is currently no signs of bleeding.  Further treatment and medication adjustments are going to be determined based on how the patient improve throughout this hospitalization.     Julieta Bellini, MD     CEM/MEDQ  D:  06/21/2010  T:  06/21/2010  Job:  [id number]  cc:   [facility]  Electronically Signed by Barton Dubois MD on 07/09/2010 10:57:35 AM

## 2010-07-10 DIAGNOSIS — F329 Major depressive disorder, single episode, unspecified: Secondary | ICD-10-CM

## 2010-07-10 LAB — BASIC METABOLIC PANEL
BUN: 5 mg/dL — ABNORMAL LOW (ref 6–23)
CO2: 24 mEq/L (ref 19–32)
Calcium: 8.1 mg/dL — ABNORMAL LOW (ref 8.4–10.5)
Chloride: 108 mEq/L (ref 96–112)
Creatinine, Ser: 1.94 mg/dL — ABNORMAL HIGH (ref 0.50–1.10)
GFR calc Af Amer: 36 mL/min — ABNORMAL LOW (ref >60)
GFR calc non Af Amer: 30 mL/min — ABNORMAL LOW (ref >60)
Glucose, Bld: 118 mg/dL — ABNORMAL HIGH (ref 70–99)
Potassium: 3.6 mEq/L (ref 3.5–5.1)
Sodium: 139 mEq/L (ref 135–145)

## 2010-07-10 LAB — GLUCOSE, CAPILLARY
Glucose-Capillary: 132 mg/dL — ABNORMAL HIGH (ref 70–99)
Glucose-Capillary: 106 mg/dL — ABNORMAL HIGH (ref 70–99)
Glucose-Capillary: 140 mg/dL — ABNORMAL HIGH (ref 70–99)
Glucose-Capillary: 140 mg/dL — ABNORMAL HIGH (ref 70–99)

## 2010-07-10 LAB — CBC
Hemoglobin: 8 g/dL — ABNORMAL LOW (ref 12.0–15.0)
MCV: 74.9 fL — ABNORMAL LOW (ref 78.0–100.0)
Platelets: 285 10*3/uL (ref 150–400)
RBC: 3.35 MIL/uL — ABNORMAL LOW (ref 3.87–5.11)
WBC: 4.1 10*3/uL (ref 4.0–10.5)

## 2010-07-14 NOTE — Consult Note (Signed)
[name], MECHANIC                ACCOUNT NO.:  [id number]  MEDICAL RECORD NO.:  [id number]  LOCATION:  [id number]                         FACILITY:  [facility]  PHYSICIAN:  Marlou Sa, MD DATE OF BIRTH:  [birth year]  DATE OF CONSULTATION:  07/10/2010 DATE OF DISCHARGE:                                CONSULTATION   REASON FOR CONSULTATION:  Depression.  HISTORY OF PRESENT ILLNESS:  This is a 30 year old female, who was admitted on the medical floor for recurrent nausea and vomiting.  The patient has extensive workup done for it.  The patient reported depressed mood all the time, but she denied any suicidal ideations currently or denies any suicidal attempt in the past. The patient is on Lexapro 10 mg and as per her, it helps only for sleep. The patient reported sleep and appetite are poor.  The patient reported that she stays by herself and generally do not talk much with other people.  People make her upset, so she wanted to hurt other people, but she did not have any specific person that she was having any suicidal ideations.  The patient was calm, cooperative during the interview, not internally preoccupied.  PAST PSYCHIATRIC HISTORY:  The patient has a history of depression, is on Lexapro, but no history of psych hospitalization or a history of suicide attempt.  The patient is not followed by counselor or psychiatrist in the outpatient setting.  SUBSTANCE ABUSE HISTORY:  The patient reported that she used cocaine only 1 time, but denied any chronic use of cocaine or any other drugs.  ALLERGIES:  No known drug allergies.  SOCIAL HISTORY:  The patient lives by herself.  Her 2 kids live with the aunt.  The patient is waiting for disability.  FAMILY PSYCHIATRIC HISTORY:  The patient denies any history of bipolar disorder or schizophrenia in the family.  MEDICAL HISTORY: 1. History of diabetes. 2. UTI. 3. Asthma. 4. Osteoarthritis. 5. Anemia.  MENTAL STATUS  EXAMINATION:  The patient was calm, cooperative during the interview.  Fair eye contact.  Guarded in providing history, but was easily re-directable and was answering questions appropriately.  Speech soft, slow.  Mood depressed, irritable.  Affect, mood congruent. Thought process logical and goal-directed.  Thought content, not suicidal or homicidal, not delusional.  Thought perception, no audiovisual hallucination reported, not internally preoccupied. Cognition, alert and oriented x3.  Memory immediate, recent, remote fair.  Attention and concentration fair.  Abstraction ability fair. Insight and judgment intact.  DIAGNOSES:  Axis I:  Depressive disorder, not otherwise specified.  Rule out major depressive disorder, recurrent type. Axis II:  Deferred. Axis III:  See medical notes. Axis IV:  Psychosocial stressor. Axis V:  50.  RECOMMENDATIONS: 1. The patient's Lexapro can be increased from 10-20 mg p.o. daily. 2. The patient will be referred to outpatient counseling if social     worker saw the patient with me and she is going to make appointment     for the patient. 3. At this time, the patient does not meet criteria to be admitted to     inpatient psychiatry.  Thanks for involving me in taking care of this patient.  Marlou Sa, MD     SA/MEDQ  D:  07/10/2010  T:  07/10/2010  Job:  [id number]  Electronically Signed by Marlou Sa  on 07/14/2010 12:08:24 PM

## 2010-07-19 NOTE — H&P (Signed)
NAMEJERALDY, [name]                ACCOUNT NO.:  [id number]  MEDICAL RECORD NO.:  [id number]  LOCATION:  WLED                         FACILITY:  [facility]  PHYSICIAN:  Barbette Merino, M.D.      DATE OF BIRTH:  02-22-1980  DATE OF ADMISSION:  07/08/2010 DATE OF DISCHARGE:                             HISTORY & PHYSICAL   PRIMARY CARE PHYSICIAN:  [facility].  PRIMARY GASTROENTEROLOGIST:  Wonda Horner, M.D.  PRESENTING COMPLAINT:  Intractable nausea, vomiting, and abdominal pain.  HISTORY OF PRESENT ILLNESS:  The patient is a 30 year old female with persistent recurrent nausea and vomiting, who was just discharged from the hospital on June 17th, but came back on the 19th with same intractable nausea and vomiting.  She has had extensive workup during the last hospitalization including an EGD and a gastric emptying study. She was also evaluated by Nephrology due to acute renal failure when her creatinine rose from 0.9 to 2.9.  She also had further evaluation from GI with normal gastric emptying study and mainly GERD on her endoscopy. The source of her nausea and vomiting was therefore unclear as of the time of discharge, although she had some UTI, which was also treated. The patient is here retching in the emergency room, saying she cannot keep anything down.  She also has some central abdominal pain, which is more of cramping than anything else.  Denied any fever.  Denied any hematochezia.  No hematemesis, no melena.  PAST MEDICAL HISTORY: 1. UTI. 2. Insulin-dependent diabetes. 3. History of cocaine abuse. 4. Asthma. 5. Anxiety disorder. 6. Depression. 7. Osteoarthritis. 8. Recent history of acute renal failure. 9. Anemia of chronic disease. 10.The patient also has history of hypokalemia and hyponatremia.  ALLERGIES:  No known drug allergies.  MEDICATIONS:  Clonidine 0.1 mg b.i.d., omeprazole 20 mg b.i.d., promethazine 6.25 mg q.6 h. p.r.n., Lantus 5 units subcu q.h.s.,  NovoLog 2 units subcu t.i.d. with meals, albuterol inhaler 2 puffs q.4 h. p.r.n., and Lexapro 10 mg daily.  The patient has, however, not been able to continue taking her medications at this point.  SOCIAL HISTORY:  She lives here in [place].  Denied tobacco, alcohol, or IV drug use.  She has 2 kids, a girl and a boy.  She is not working and waiting for her disability.  FAMILY HISTORY:  Mother has CHF, renal failure.  There is also a history of hypertension and diabetes in the family.  She does not know who her father is.  REVIEW OF SYSTEMS:  All systems reviewed are negative except per HPI.  PHYSICAL EXAMINATION:  VITAL SIGNS:  Temperature is 97.7, blood pressure 151/96, pulse 97, respiratory 20, sats 100% on room air. GENERAL:  The patient is awake, alert, oriented.  She is in no acute distress, communicating without problem. HEENT:  PERRL.  EOMI.  No pallor, no jaundice.  Mild dehydration, evident from dry mucous membranes. NECK:  Supple.  No visible JVD, no lymphadenopathy. RESPIRATORY:  She has good air entry bilaterally.  No wheezes, no rales, no crackles. CARDIOVASCULAR:  She has S1 and S2.  No audible murmur. ABDOMEN:  Soft, full, nontender with positive bowel sounds. EXTREMITIES:  No edema, cyanosis, or clubbing. SKIN:  No rashes, no ulcers. MUSCULOSKELETAL:  No joint swelling, tenderness.  LABORATORY DATA:  Sodium is 139, potassium 3.7, chloride is 103, CO2 is 23, glucose 141, BUN 8, creatinine is 2.52.  Total bilirubin 0.2, albumin of 3.0, and calcium of 9.6, rest of the LFTs are within normal. White count is 6.5, hemoglobin 9.2 with an MCV of 75, platelet count of 351.  Urinalysis shows concentrated urine with large blood, proteinuria, and small leukocyte esterase.  Urine microscopy showed hyaline casts, WBC 11-20, RBC 7-10, and a few bacteria.  Serum pregnancy test is negative.  ASSESSMENT:  This is a 30 year old female with persistent nausea and vomiting that  has been fully worked up.  She has no diarrhea.  The only finding is urinary tract infection as well as acute renal failure.  The patient's nausea and vomiting could therefore be related to the urinary tract infection, which seems is not completely gone.  It could also be secondary to cocaine use since the patient has a history of cocaine abuse and was not thought to be still there during previous hospitalization.  Gastroparesis is likely in a diabetic, but again her recent gastric emptying study was normal.  Other possibilities are other medications that the patient is taking.  Viral infection is possible, but again the persistent nature over 2 weeks makes that probably less likely.  She has a history of gastroesophageal reflux disease, but denied any substance use.  From the EGD last time, there was no evidence of gastritis.  PLAN: 1. Intractable nausea and vomiting.  I will readmit the patient, keep     her n.p.o., hydrate the patient.  Symptom control mainly.  I will     consider adding Reglan, although she has no evidence of gastric     emptying abnormalities.  If possible, will consult GI again to give     fluids into the next line of action. 2. UTI.  I will re-treat the patient for possible E coli UTI that was     diagnosed early in the month.  The E coli then was sensitive to     every medication on the testing, so I will use Rocephin for now. 3. Diabetes.  I will put her on sliding scale insulin and resume her     Lantus as soon as practical once she is able to eat. 4. History of asthma.  The patient's breathing is okay at this point.     No evidence of attack.  We will keep her on p.r.n. nebulizer. 5. History of cocaine abuse.  I will check a urine drug screen.  If     the patient is also cocaine positive, this could indicate that her     symptoms may be cocaine related. 6. Acute renal failure, more than likely prerenal.  Her renal function     improved the last time she was  here, but now has worsened secondary     to probably nausea and vomiting.  We will hydrate her and follow     renal function closely. 7. Iron deficiency anemia.  The patient will need some iron therapy at     the time of discharge once she is able to     take POs. 8. Anxiety disorder.  Again, she is on Lexapro for her     anxiety/depression and we will resume that as soon as possible.     Barbette Merino, M.D.  LG/MEDQ  D:  07/08/2010  T:  07/08/2010  Job:  [id number]  Electronically Signed by Barbette Merino M.D. on 07/19/2010 12:36:57 AM

## 2010-07-20 NOTE — Discharge Summary (Signed)
[name], [name]                ACCOUNT NO.:  [id number]  MEDICAL RECORD NO.:  [id number]  LOCATION:  5508                         FACILITY:  [facility]  PHYSICIAN:  Vernell Leep, MD     DATE OF BIRTH:  1980/02/14  DATE OF ADMISSION:  06/21/2010 DATE OF DISCHARGE:  07/05/2010                              DISCHARGE SUMMARY   PRIMARY CARE PHYSICIAN:  At [facility], Dr. Gayleen Orem.  DISCHARGE DIAGNOSES: 1. Intractable nausea and vomiting, unclear etiology.  Resolved. 2 . Acute renal failure, improving. 3. Type 2 diabetes mellitus. 4. Anemia, stable. 5. Hypertension. 6. Escherichia coli urinary tract infection, treated. 7. Hyponatremia, resolved. 8. History of anxiety and depression. 9. Bronchial asthma, stable. 10. Hypokalemia, repleted.  DISCHARGE MEDICATIONS: 1. Clonidine 0.1 mg p.o. b.i.d. 2. Omeprazole 20 mg p.o. b.i.d. for 1 month, then once daily. 3. Promethazine 6.25 mg p.o. q.6 hourly p.r.n. nausea. 4. Lantus 5 units subcutaneously at bedtime. 5. NovoLog 2 units subcutaneously t.i.d. with meals. 6. Albuterol inhaler 2 puffs inhaled q.4 hourly p.r.n. 7. Lexapro 10 mg p.o. daily.  PROCEDURES:  EGD by Dr. Anson Fret on July 01, 2010.  Impression: Hiatal hernia, erythema in the stomach, bile in the stomach, and erosions in the distal duodenum of uncertain etiology.  IMAGING: 1. Gastric emptying scan.  Impression:  Normal study. 2. Ultrasound of abdomen, negative abdominal ultrasound. 3. Abdominal x-ray on June 9, nonobstructive bowel gas pattern. 4. Chest x-ray on June 9, no evidence of active pulmonary disease. 5. Chest x-ray on June 7. Impression: A.  Developing right upper lobe infection. B.  Possible concurrent right lower lobe pneumonia 1. Chest x-ray on June 4.  Impression:  Low volume film, otherwise     normal. 2. Bilateral lower extremity venous Doppler June 6, no evidence of     deep vein or superficial thrombosis involving the right and left    lower extremity.  PERTINENT LABS:  Basic metabolic panel today with BUN 7, creatinine 2.24.  CBC:  Hemoglobin 9, hematocrit 27.2, white blood cell 6.4, platelets 334,000, MCV of 74.  Blood cultures x2 from June 6 are no growth, final report.  Urinalysis on June 12 showed 7-10 white blood cells and many squamous cells.  Urine pregnancy test was negative. Urine culture from June 3 showed E. coli greater than 100,000 colonies per mL which was pansensitive.  Prolactin level was 55.2, TSH was 0.435. Procalcitonin 4.91.  Venous lactate on admission 0.8.  Anemia panel showed iron less than 10, vitamin B12 366, serum folate 5.1, ferritin 94.  Another TSH was 3.994, hemoglobin A1c 11.8.  Urine drug screen positive for cocaine, opiates, and tetrahydrocannabinol.  Lipid panel was within normal limits.  CK 124, INR normal.  Urinalysis on admission was 21-50 white blood cells and many bacteria.  Urine pregnancy test was negative.  Creatinine on admission was 1.68, hemoglobin was 10.3, and white blood cell 15.2 on admission.  CONSULTATIONS:  Gastroenterology, Dr. Anson Fret.  DIET:  Diabetic diet.  ACTIVITIES:  Ad lib.  COMPLAINTS:  None.  The patient indicates she has not vomited for more than 36 hours.  She has tolerated regular diet.  She denies abdominal  pain.  She denies any delusions, hallucinations, or suicidal or homicidal ideations.  REVIEW OF SYSTEMS:  Negative.  PHYSICAL EXAMINATION:  GENERAL:  The patient is in no obvious distress. VITAL SIGNS:  Temperature 98.7 degrees Fahrenheit, pulse 81 per minute, respiration 17 per minute, blood pressure 136/87 mmHg, ranging from 136- 172/87-99 mmHg and saturating at 100% on room air. RESPIRATORY SYSTEM:  Clear. CARDIOVASCULAR SYSTEM:  First and second heart sounds heard, regular. ABDOMEN:  Soft, nondistended, nontender.  Bowel sounds present normally. CENTRAL NERVOUS SYSTEM:  The patient awake, alert, oriented x3 with no focal  neurological deficit. PSYCHIATRIC:  The patient has a flat affect, but otherwise pleasant and cooperative.  CBGs range from 124-153 mg/dL.  HOSPITAL COURSE:  [name] is a 30 year old African American female patient with history of diabetes mellitus, asthma, depression, anxiety, who ran out of her insulins 2 weeks prior to admission came to the hospital with history of nausea, vomiting which was intractable.  She had some dysuria and suprapubic pain.  In the emergency room, she was found to have urinary tract infection, dehydration, sinus tachycardia, and a CBG of 549 with anion gap of 12.  The [facility] were requested to admit for further evaluation and management. 1. Intractable nausea and vomiting.  Initially it was thought that     this was secondary to urinary tract infection.  The same was     treated despite which she continued to have intractable nausea and     vomiting.  Gastroenterology was consulted.  They initially     attempted a gastric emptying scan with egg which the patient could     not tolerate.  Subsequently she had an EGD with findings as     indicated above.  She then had a gastric emptying scan with oat    meals which was normal.  She briefly was on Reglan.  After the     gastric emptying scan was negative, her Reglan was stopped.  Her     diet was advanced and she spontaneously has improved with no     further nausea or vomiting.  The gastroenterologist has cleared her     for discharge on PPI and oral Reglan.  She is advised to avoid     nonsteroidal anti-inflammatory drugs. 2. E. coli urinary tract infection.  Treated. 3. Acute renal failure.  Although her admitting creatinine was 1.6,     this returned to normal after hydration.  However, abruptly her     creatinine increased which was possibly multifactorial secondary to     antibiotics including vancomycin, Rocephin, and Levaquin suggestive     of acute interstitial nephritis.  Unclear if she had  some ATN,     although no low blood pressures were documented during that time     frame.  These antibiotics were discontinued.  Her creatinine have     improved but have plateaued out in the last 1 or 2 days.  These can     be followed up as an outpatient and if the creatinine's don't    continue to improve or worsen, then consider outpatient Nephrology     consultation with [facility].  Also to avoid     nephrotoxic medications. 4. Anxiety and depression.  The patient may not have been compliant     with her medications.  She has been on Lexapro in the hospital and     p.r.n. Ativan.  This can be followed up as  an outpatient. 5. Uncontrolled diabetes mellitus as indicated by the hemoglobin A1c.     Currently the sugars are reasonably controlled with Lantus 5 units.     This can be titrated as well as the mealtime NovoLog as deemed     necessary. 6. Polysubstance abuse.  When the social worker tried to counsel her     regarding the substance abuse, the patient did not want to talk     about them.  She has been counseled regarding cessation. 7. Hypokalemia secondary to vomiting, repleted. 8. Hyponatremia, resolved. 9. Uncontrolled hypertension.  Reluctant to start thiazide diuretics.     Thiazide diuretics or ACE inhibitors or ARB secondary to her recent     acute renal failure.  Also hesitant to start beta-blocker secondary     to her history of cocaine abuse.  Thereby the patient had been     placed on a clonidine and will be discharged home on the     same.  When her renal functions improve, then consider starting ACE     inhibitor or ARB which may also be renal protective in this     diabetic patient. 10.Medication noncompliance.  The patient has been counseled regarding     compliance with medications and she verbalizes understanding.  We will obtain case management assistance for medications and a short supply of medications through the hospital pharmacy.  The  rest of them have been filled as generic versions which can be obtained through the retail pharmacies at a reduced price.  DISPOSITION:  The patient is discharged home in stable condition.  FOLLOWUP RECOMMENDATIONS: 1. With [facility] for eligibility appointment on June 25 at 9:15     a.m. 2. With Dr. Gayleen Orem on June 26 at 10:00 a.m. 3. With Dr. Anson Fret with [facility] gastroenterology as needed and the     patient is to call for an appointment.  Time taken in coordinating this discharge was 45 minutes.  SOme of medications were filled thru the hospital pharmacy, but patient did not wait and left without taking these!    Vernell Leep, MD     AH/MEDQ  D:  07/05/2010  T:  07/05/2010  Job:  [id number]  cc:   Dr. Tarri Abernethy, M.D.  Electronically Signed by Vernell Leep MD on 07/20/2010 11:48:55 PM

## 2010-07-24 NOTE — Discharge Summary (Signed)
[name], [name]                ACCOUNT NO.:  [id number]  MEDICAL RECORD NO.:  [id number]  LOCATION:  [id number]                         FACILITY:  [facility]  PHYSICIAN:  Domingo Mend, M.D. DATE OF BIRTH:  04-08-1980  DATE OF ADMISSION:  07/08/2010 DATE OF DISCHARGE:  07/10/2010                        DISCHARGE SUMMARY - REFERRING   PRIMARY CARE PHYSICIAN:  Dr. Maeola Yates at [facility].  DISCHARGE DIAGNOSES: 1. Intractable nausea and vomiting. 2. Acute renal failure. 3. Urinary tract infection. 4. Diabetes type 2. 5. Depression. 6. Iron deficiency anemia. 7. Hypertension.  DISCHARGE MEDICATIONS: 1. Lexapro 20 mg p.o. daily. 2. Albuterol inhaler 2 puffs inhaled over 4 hours as needed for     shortness of breath. 3. Clonidine 0.1 mg p.o. b.i.d. 4. Lantus 5 units subcu daily at bedtime. 5. NovoLog insulin 2 units subcutaneously t.i.d. with meals. 6. Omeprazole 20 mg p.o. b.i.d. 7. Phenergan 25 mg 0.25 tablets by mouth every 6 hours as needed for     nausea.  DIAGNOSTIC LABORATORY DATA:  WBC 6.5, hemoglobin 9.2, hematocrit 28.7, platelets 351,000.  Sodium 137, potassium 3.8, chloride 103, CO2 of 23, BUN 8, creatinine 2.52, total bilirubin 0.2, albumin 3.0.  Urinalysis with small leukocytes, negative nitrites, 30 protein, large blood, 11-20 WBCs, 7-10 RBCs, few bacteria.  Urine drug screen was negative. Magnesium level 1.6, phosphorus 4.4, TSH 0.678.  Urine culture showed multiple bacterial, morphocytes none predominant.  DIAGNOSTIC IMAGING:  On July 08, 2010, a CT of the abdomen and pelvis yields a small amount of free fluid within the pelvis.  This maybe physiologic in a premenopausal female.  Pulmonary nodule on the left base.  If the patient is at risk for bronchogenic carcinoma, followup CT at 6-12 months is recommended.  If the patient is at low risk for bronchiogenic carcinoma, follow-up chest CT at 12 months.  CONSULTATIONS:  On July 10, 2010, Marlou Sa, M.D.  from Psychiatry.  PROCEDURES:  None.  BRIEF HISTORY:  Patient is a 30 year old female with persistent recurrent nausea and vomiting, who was just discharged from the hospital on July 05, 2010, but came back on the July 07, 2010 with the same intractable nausea and vomiting.  She has had extensive workup during the last hospitalization that included an EGD and a gastric emptying study.  She was also evaluated by nephrology due to acute renal failure wherein her creatinine rose from 0.9-2.9.  She also had further evaluation from GI with normal gastric emptying study and mainly GERD on her endoscopy.  Source of her nausea and vomiting was therefore unclear as of the time of discharge, although she did have a UTI, which was also treated.  The patient represented on July 08, 2010 retching in the Emergency Room saying she cannot keep anything down.  She also complained of central abdominal pain, cramping in nature.  Denied fever, chills, hematochezia or hemoptysis.  [facility] were asked to admit for further evaluation and treatment.  HOSPITAL COURSE BY PROBLEM: 1. Intractable nausea, vomiting, resolved:  Etiology unclear.  Patient     was admitted, was kept n.p.o., hydrated with IV fluids and provided     with antiemetics.  She had no evidence of gastric  emptying     abnormalities.  Her nausea approximately 12-16 hours after     admission subsided and patient requested "real food."  Her diet was     advanced and she was able to tolerate a regular diet from that     point on.  She did continue to complain of some abdominal pain.  CT     of the abdomen and pelvis was done and as noted above.  At the time     of discharge, patient is tolerating a regular carb modified diet     without problem. 2. Urinary tract infection:  Patient had Rocephin IV for 3 days.  This     was for possible E. Coli UTI that she had diagnosed earlier in the     month.  Patient did not experience any dysuria  or hematuria during     her hospitalization. 3. Acute renal failure presumably prerenal secondary to gastric     losses:  Patient was provided with IV fluids.  Her creatinine     gradually improved.  Went from 2.36 on admission to 1.94 at the     time of discharge.  Patient was instructed to continue p.o. fluids     and avoid nephrotoxins.  She will need to follow up with her     primary care provider in 4 weeks to evaluate kidney function. 4. Diabetes type 2:  Once patient started eating her diet, her home     Lantus and NovoLog dosages were restarted.  Her blood sugar was     controlled. 5. Iron-deficiency anemia:  Patient continued on iron. 6. Hypertension:  Patient's home clonidine dose continued initially     via patch secondary to her nausea.  At the time of discharge, she     is back on p.o. clonidine.  This provided good blood pressure     control. 7. Depression:  Patient on 10 mg of Lexapro.  Verbalized worsening     depression.  Was seen by Psychiatry on July 10, 2010.  Her Lexapro     was increased to 20 mg.  She has also been set up with counseling,     Fransico Michael, July 17, 2010 at 11 a.m. 8. Abnormal CT:  Her CT of the abdomen and pelvis did yield a     pulmonary nodule in the left base.  Recommended followup chest CT     in 6-12 months.  Patient as a client at [facility], is following     up with Dr. Maeola Yates on July 14, 2010 at 10:00 a.m.  Will need to     follow up on abnormal CT as well and determine if that needs to be     done at 6 or 12 months.  PHYSICAL EXAMINATION:  VITAL SIGNS:  Temperature 98.5, blood pressure 128/84, HR 72, respiration 16, sats 100% on room air. GENERAL:  Awake, alert, no acute distress. CV:  Regular rate and rhythm.  No murmur, gallop or rub.  No lower extremity edema. RESPIRATORY:  Normal effort.  Breath sounds clear to auscultation bilaterally.  No rhonchi, wheezes or rales. ABDOMEN:  Round, soft, positive bowel sounds throughout,  nontender to palpation.  No mass or organomegaly noted. NEUROLOGIC:  Alert and oriented x3.  Speech clear.  Facial symmetry.  FOLLOWUP: 1. Patient will see Dr. Maeola Yates at [facility] on July 14, 2010 at     10:00 a.m. 2. Patient will follow up at [facility] on [address]  Marlene Lard     on July 17, 2010 at 11:00 a.m.  DISPOSITION:  Patient is medically stable and ready for discharge.  Time spent on this discharge 40 minutes.     Radene Gunning, NP   ______________________________ Domingo Mend, M.D.    KMB/MEDQ  D:  07/10/2010  T:  07/10/2010  Job:  [id number]  cc:   Dr. Maeola Yates  Electronically Signed by Domingo Mend M.D. on 07/24/2010 08:07:28 AM

## 2010-08-03 ENCOUNTER — Emergency Department (HOSPITAL_COMMUNITY)
Admission: EM | Admit: 2010-08-03 | Discharge: 2010-08-03 | Disposition: A | Discharge status: Home / Self Care | Payer: Self-pay | Attending: Emergency Medicine | Admitting: Emergency Medicine

## 2010-08-03 ENCOUNTER — Emergency Department (HOSPITAL_COMMUNITY): Payer: Self-pay

## 2010-08-03 DIAGNOSIS — D649 Anemia, unspecified: Secondary | ICD-10-CM | POA: Insufficient documentation

## 2010-08-03 DIAGNOSIS — R109 Unspecified abdominal pain: Secondary | ICD-10-CM | POA: Insufficient documentation

## 2010-08-03 LAB — COMPREHENSIVE METABOLIC PANEL
Alkaline Phosphatase: 88 U/L (ref 39–117)
BUN: 15 mg/dL (ref 6–23)
Calcium: 9.9 mg/dL (ref 8.4–10.5)
GFR calc Af Amer: 46 mL/min — ABNORMAL LOW (ref >60)
Glucose, Bld: 158 mg/dL — ABNORMAL HIGH (ref 70–99)
Potassium: 3.9 mEq/L (ref 3.5–5.1)
Total Protein: 8.6 g/dL — ABNORMAL HIGH (ref 6.0–8.3)

## 2010-08-03 LAB — CBC
HCT: 29 % — ABNORMAL LOW (ref 36.0–46.0)
Hemoglobin: 9.3 g/dL — ABNORMAL LOW (ref 12.0–15.0)
MCH: 24 pg — ABNORMAL LOW (ref 26.0–34.0)
MCHC: 32.1 g/dL (ref 30.0–36.0)
MCV: 74.9 fL — ABNORMAL LOW (ref 78.0–100.0)

## 2010-08-03 LAB — URINALYSIS, ROUTINE W REFLEX MICROSCOPIC
Bilirubin Urine: NEGATIVE
Hgb urine dipstick: NEGATIVE
Specific Gravity, Urine: 1.016 (ref 1.005–1.030)
pH: 6 (ref 5.0–8.0)

## 2010-08-03 LAB — DIFFERENTIAL
Lymphocytes Relative: 48 % — ABNORMAL HIGH (ref 12–46)
Lymphs Abs: 2.2 10*3/uL (ref 0.7–4.0)
Monocytes Absolute: 0.3 10*3/uL (ref 0.1–1.0)
Monocytes Relative: 6 % (ref 3–12)
Neutro Abs: 1.9 10*3/uL (ref 1.7–7.7)

## 2010-08-03 LAB — LIPASE, BLOOD: Lipase: 35 U/L (ref 11–59)

## 2010-08-03 MED ORDER — IOHEXOL 300 MG/ML  SOLN
100.0000 mL | Freq: Once | INTRAMUSCULAR | Status: AC | PRN
  Administered 2010-08-03: 100 mL via INTRAVENOUS

## 2010-08-04 NOTE — Consult Note (Signed)
[name], [name]                ACCOUNT NO.:  [id number]  MEDICAL RECORD NO.:  [id number]  LOCATION:                                 FACILITY:  PHYSICIAN:  Sherril Croon, M.D.   DATE OF BIRTH:  May 31, 1980  DATE OF CONSULTATION: DATE OF DISCHARGE:                                CONSULTATION   Acute renal failure.  Serum creatinine increased from 0.9 to 2.9 in 2 days from June 26, 2010, to June 28, 2010.  No obvious nephrotoxins or ischemic episodes.  No evidence of any shock or hypotension.  No episodes of volume excess, although had been complaining of some nausea and vomiting.  Weight has been relatively stable since admission with an 85 kg weight on admission, 83 kg today.  Multiple antibiotic usage, many of which discontinued.  Levaquin was last to be discontinued.  Rocephin was given for a period of 4 days until June 25, 2010.  She had vancomycin for 2 days.  Vancomycin levels were therapeutic.  PAST MEDICAL HISTORY: 1. Diabetes. 2. Hypertension. 3. Depression. 4. Asthma.  MEDICATIONS: 1. Aspirin 81 mg a day. 2. Clonidine 0.1 mg every 7 days. 3. Lantus 10 units nightly. 4. Lexapro 10 mg a day. 5. Protonix 40 mg a day.  SOCIAL HISTORY:  No alcohol or tobacco.  She lives with 2 children.  FAMILY HISTORY:  Unknown, although mother does have renal failure.  REVIEW OF SYSTEMS:  GENERAL:  Fatigued and weak.  No fever or chills. EYES:  No visual loss or double vision.  EARS, NOSE, MOUTH, AND THROAT: No hearing loss, epistaxis, sore throat, or sinusitis.  CARDIOVASCULAR: No anginal chest pain, syncope, or ankle or leg swelling.  RESPIRATORY: No cough, wheezing, hemoptysis, or shortness of breath.  ABDOMEN:  No abdominal pain or distention.  Admits to nausea and vomiting, but no diarrhea stools.  NEUROLOGIC:  No strokes, seizures, numbness, tingling, paresthesia, or weakness.  She is lethargic secondary to antiemetic medications.  MUSCULOSKELETAL:  No use of nonsteroidal  antiinflammatory drugs.  DERMATOLOGIC:  No skin rash or itching.  ENDOCRINE:  History of diabetes.  No thyroid disease.  PHYSICAL EXAMINATION:  GENERAL:  Alert, nondistressed, lethargic. VITAL SIGNS:  Blood pressure 130/90, temperature afebrile, pulse of 86. HEAD AND EYES:  Normocephalic and atraumatic.  No icterus or pallor.  No meningismus.  No photophobia. EARS, NOSE, MOUTH, AND THROAT:  Tympanic membrane is normal.  Oropharynx is clear with moist mucous membranes. NECK:  Supple.  No increased JVP.  No carotid bruits. CARDIOVASCULAR:  No heaves, thrills, rubs, or gallops.  Regular rate and rhythm. RESPIRATORY:  Lungs are clear to auscultation.  No wheezes or rales percussion and resonant throughout. ABDOMEN:  Soft and nontender. EXTREMITIES:  No cyanosis, clubbing, or edema.  2+ peripheral pulses.  MOST RECENT LABORATORY DATA:  Vancomycin 12.1.  Other labs pending this morning.  Urinalysis:  Bland, urine sodium 144.  Renal ultrasound:  No hydronephrosis on June 28, 2010.  ASSESSMENT/PLAN: 1. Acute renal failure.  We will check urine eosinophils, check urine     microscopy. 2. Hypertension. Volume, follow daily weights, I's and O's. 3. Escherichia coli urinary tract infection,  treated with antibiotics. 4. Disposition.  I suspect this is acute renal failure secondary to     vancomycin.  There is no evidence to suggest ischemic ATN episode.     We will continue to follow her in the next couple of days.     Sherril Croon, M.D.     MWW/MEDQ  D:  06/30/2010  T:  07/01/2010  Job:  [id number]  Electronically Signed by Edrick Oh M.D. on 08/04/2010 05:09:33 PM

## 2010-08-05 NOTE — Consult Note (Signed)
  NAMEKIMMYA, [name]                ACCOUNT NO.:  [id number]  MEDICAL RECORD NO.:  [id number]  LOCATION:  [id number]                         FACILITY:  [place]  PHYSICIAN:  Arnell Mausolf C. Amedeo Plenty, M.D.    DATE OF BIRTH:  June 19, 1980  DATE OF CONSULTATION:  06/27/2010 DATE OF DISCHARGE:                                CONSULTATION   REASON FOR CONSULTATION:  Nausea and vomiting.  HISTORY OF PRESENT ILLNESS:  The patient is a 30 year old white female admitted on June 21, 2010, with onset of 3-4 days of nausea and vomiting. She also had chills and dysuria and is diagnosed with UTI.  Her ABG was 549 on admission, but she was not acidotic.  She had been started on antibiotics and Reglan, but has continued to have nausea and vomiting. A nuclear medicine gastric emptying study is scheduled for 2-3 days from now, but we are asked to evaluate and consider EGD.  The patient is very somnolent from pain or nausea medicines and would not rouse for interview at present.  She was not having abdominal pain.  PAST MEDICAL HISTORY:  Unremarkable or at least poorly documented previous admissions.  She does have history of depression.  SOCIAL HISTORY:  The patient lives alone with poor social support, reportedly does have children living elsewhere.  ALLERGIES:  None known.  PHYSICAL EXAMINATION:  VITAL SIGNS:  Temperature 98.5, pulse 65, blood pressure 155/84. GENERAL:  She is currently somnolent and not consistently arousable. ABDOMEN:  Soft, no grimacing to deep palpation.  IMPRESSION:  Nausea, vomiting; presumed related to diabetic gastroparesis, possibly exacerbated by hyperglycemic episode and urinary tract infection.  PLAN:  We will discuss with her tomorrow possible endoscopy versus waiting for gastric emptying study first.          ______________________________ Elyse Jarvis. Amedeo Plenty, M.D.     JCH/MEDQ  D:  06/27/2010  T:  06/27/2010  Job:  [id number]  Electronically Signed by Teena Irani M.D. on 08/05/2010  07:01:19 PM

## 2010-08-12 NOTE — Op Note (Signed)
  NAMETAQWA, [name]                ACCOUNT NO.:  [id number]  MEDICAL RECORD NO.:  [id number]  LOCATION:  5508                         FACILITY:  [facility]  PHYSICIAN:  Wonda Horner, M.D.   DATE OF BIRTH:  [birth year]  DATE OF PROCEDURE:  07/01/2010 DATE OF DISCHARGE:                              OPERATIVE REPORT   INDICATIONS FOR PROCEDURE:  Persistent ongoing vomiting of uncertain etiology.  Informed consent was obtained after explanation of the risks of bleeding, infection and perforation.  PREMEDICATIONS:  Fentanyl 75 mcg IV, Versed 6 mg IV, Benadryl 25 mg IV.  PROCEDURE:  With the patient in the left lateral decubitus position, the Pentax gastroscope was inserted into the oropharynx and passed into the esophagus.  I did notice bile in the esophagus.  The scope was advanced down the esophagus and the mucosa looked normal in the esophagus.  The esophagogastric junction was at 35 cm.  There was a small to moderate- sized hiatal hernia present.  The scope was advanced into the stomach which showed some generalized mild erythema.  The patient had quite a bit of retching while doing this procedure.  There was bile in the stomach.  No other lesions were seen in the stomach.  Biopsies were obtained from the stomach for histology.  The duodenal bulb looked normal but in the distal duodenum there were a couple of erosive areas of uncertain etiology and these were biopsied.  There was no evidence of any obstruction.  The scope was brought out.  She tolerated the procedure well without complications.  IMPRESSION:  Hiatal hernia, erythema in the stomach, bile in the stomach, and erosions in the distal duodenum of uncertain etiology.  I do not see any obvious reason for this patient's persistent nausea and vomiting.  She may have a gastric motility disorder but she was unable to go through the gastric emptying study because she vomited the egg preparation.  We will continue proton pump  inhibitor therapy and begin Reglan to see if this helps.          ______________________________ Wonda Horner, M.D.     SFG/MEDQ  D:  07/01/2010  T:  07/02/2010  Job:  [id number]  cc:   [facility]  Electronically Signed by Anson Fret MD on 08/12/2010 03:42:31 PM

## 2010-08-18 ENCOUNTER — Emergency Department (HOSPITAL_COMMUNITY): Payer: Self-pay

## 2010-08-18 ENCOUNTER — Emergency Department (HOSPITAL_COMMUNITY)
Admission: EM | Admit: 2010-08-18 | Discharge: 2010-08-18 | Disposition: A | Discharge status: Home / Self Care | Payer: Self-pay | Attending: Emergency Medicine | Admitting: Emergency Medicine

## 2010-08-18 DIAGNOSIS — Z794 Long term (current) use of insulin: Secondary | ICD-10-CM | POA: Insufficient documentation

## 2010-08-18 DIAGNOSIS — J45909 Unspecified asthma, uncomplicated: Secondary | ICD-10-CM | POA: Insufficient documentation

## 2010-08-18 DIAGNOSIS — R0602 Shortness of breath: Secondary | ICD-10-CM | POA: Insufficient documentation

## 2010-08-18 DIAGNOSIS — R059 Cough, unspecified: Secondary | ICD-10-CM | POA: Insufficient documentation

## 2010-08-18 DIAGNOSIS — Z79899 Other long term (current) drug therapy: Secondary | ICD-10-CM | POA: Insufficient documentation

## 2010-08-18 DIAGNOSIS — IMO0001 Reserved for inherently not codable concepts without codable children: Secondary | ICD-10-CM | POA: Insufficient documentation

## 2010-08-18 DIAGNOSIS — J40 Bronchitis, not specified as acute or chronic: Secondary | ICD-10-CM | POA: Insufficient documentation

## 2010-08-18 DIAGNOSIS — F341 Dysthymic disorder: Secondary | ICD-10-CM | POA: Insufficient documentation

## 2010-08-18 DIAGNOSIS — R093 Abnormal sputum: Secondary | ICD-10-CM | POA: Insufficient documentation

## 2010-08-18 DIAGNOSIS — E119 Type 2 diabetes mellitus without complications: Secondary | ICD-10-CM | POA: Insufficient documentation

## 2010-08-18 DIAGNOSIS — R000 Tachycardia, unspecified: Secondary | ICD-10-CM | POA: Insufficient documentation

## 2010-08-18 DIAGNOSIS — R05 Cough: Secondary | ICD-10-CM | POA: Insufficient documentation

## 2010-08-18 DIAGNOSIS — M1990 Unspecified osteoarthritis, unspecified site: Secondary | ICD-10-CM | POA: Insufficient documentation

## 2010-09-03 ENCOUNTER — Emergency Department (HOSPITAL_COMMUNITY): Payer: Self-pay

## 2010-09-03 ENCOUNTER — Emergency Department (HOSPITAL_COMMUNITY)
Admission: EM | Admit: 2010-09-03 | Discharge: 2010-09-04 | Disposition: A | Discharge status: Home / Self Care | Payer: Self-pay | Attending: Emergency Medicine | Admitting: Emergency Medicine

## 2010-09-03 DIAGNOSIS — F341 Dysthymic disorder: Secondary | ICD-10-CM | POA: Insufficient documentation

## 2010-09-03 DIAGNOSIS — J45909 Unspecified asthma, uncomplicated: Secondary | ICD-10-CM | POA: Insufficient documentation

## 2010-09-03 DIAGNOSIS — E119 Type 2 diabetes mellitus without complications: Secondary | ICD-10-CM | POA: Insufficient documentation

## 2010-09-03 DIAGNOSIS — R5381 Other malaise: Secondary | ICD-10-CM | POA: Insufficient documentation

## 2010-09-03 DIAGNOSIS — R05 Cough: Secondary | ICD-10-CM | POA: Insufficient documentation

## 2010-09-03 DIAGNOSIS — Z79899 Other long term (current) drug therapy: Secondary | ICD-10-CM | POA: Insufficient documentation

## 2010-09-03 DIAGNOSIS — R059 Cough, unspecified: Secondary | ICD-10-CM | POA: Insufficient documentation

## 2010-09-03 DIAGNOSIS — M1990 Unspecified osteoarthritis, unspecified site: Secondary | ICD-10-CM | POA: Insufficient documentation

## 2010-09-03 DIAGNOSIS — R5383 Other fatigue: Secondary | ICD-10-CM | POA: Insufficient documentation

## 2010-09-03 DIAGNOSIS — Z794 Long term (current) use of insulin: Secondary | ICD-10-CM | POA: Insufficient documentation

## 2010-09-03 DIAGNOSIS — IMO0001 Reserved for inherently not codable concepts without codable children: Secondary | ICD-10-CM | POA: Insufficient documentation

## 2010-09-03 LAB — URINALYSIS, ROUTINE W REFLEX MICROSCOPIC
Bilirubin Urine: NEGATIVE
Hgb urine dipstick: NEGATIVE
Ketones, ur: NEGATIVE mg/dL
Protein, ur: NEGATIVE mg/dL
Urobilinogen, UA: 0.2 mg/dL (ref 0.0–1.0)

## 2010-09-03 LAB — COMPREHENSIVE METABOLIC PANEL
BUN: 20 mg/dL (ref 6–23)
Calcium: 9.6 mg/dL (ref 8.4–10.5)
GFR calc Af Amer: 58 mL/min — ABNORMAL LOW (ref >60)
Glucose, Bld: 142 mg/dL — ABNORMAL HIGH (ref 70–99)
Total Protein: 8.3 g/dL (ref 6.0–8.3)

## 2010-09-03 LAB — CBC
MCH: 24.7 pg — ABNORMAL LOW (ref 26.0–34.0)
MCHC: 32.6 g/dL (ref 30.0–36.0)
Platelets: 308 10*3/uL (ref 150–400)
RBC: 3.96 MIL/uL (ref 3.87–5.11)

## 2010-09-03 LAB — DIFFERENTIAL
Basophils Relative: 0 % (ref 0–1)
Eosinophils Absolute: 0.2 10*3/uL (ref 0.0–0.7)
Monocytes Absolute: 0.4 10*3/uL (ref 0.1–1.0)
Monocytes Relative: 6 % (ref 3–12)
Neutrophils Relative %: 47 % (ref 43–77)

## 2010-09-03 LAB — CK TOTAL AND CKMB (NOT AT ARMC)
Relative Index: 1.9 (ref 0.0–2.5)
Total CK: 103 U/L (ref 7–177)

## 2010-09-03 LAB — POCT I-STAT TROPONIN I: Troponin i, poc: 0 ng/mL (ref 0.00–0.08)

## 2010-09-04 MED ORDER — IOHEXOL 300 MG/ML  SOLN
100.0000 mL | Freq: Once | INTRAMUSCULAR | Status: AC | PRN
  Administered 2010-09-04: 100 mL via INTRAVENOUS

## 2010-09-16 ENCOUNTER — Emergency Department (HOSPITAL_COMMUNITY)
Admission: EM | Admit: 2010-09-16 | Discharge: 2010-09-16 | Disposition: A | Discharge status: Home / Self Care | Payer: Self-pay | Attending: Emergency Medicine | Admitting: Emergency Medicine

## 2010-09-16 DIAGNOSIS — Z79899 Other long term (current) drug therapy: Secondary | ICD-10-CM | POA: Insufficient documentation

## 2010-09-16 DIAGNOSIS — M1990 Unspecified osteoarthritis, unspecified site: Secondary | ICD-10-CM | POA: Insufficient documentation

## 2010-09-16 DIAGNOSIS — F341 Dysthymic disorder: Secondary | ICD-10-CM | POA: Insufficient documentation

## 2010-09-16 DIAGNOSIS — E119 Type 2 diabetes mellitus without complications: Secondary | ICD-10-CM | POA: Insufficient documentation

## 2010-09-16 DIAGNOSIS — Z794 Long term (current) use of insulin: Secondary | ICD-10-CM | POA: Insufficient documentation

## 2010-09-16 DIAGNOSIS — R109 Unspecified abdominal pain: Secondary | ICD-10-CM | POA: Insufficient documentation

## 2010-09-16 DIAGNOSIS — R197 Diarrhea, unspecified: Secondary | ICD-10-CM | POA: Insufficient documentation

## 2010-09-16 DIAGNOSIS — F411 Generalized anxiety disorder: Secondary | ICD-10-CM | POA: Insufficient documentation

## 2010-09-16 DIAGNOSIS — R112 Nausea with vomiting, unspecified: Secondary | ICD-10-CM | POA: Insufficient documentation

## 2010-09-16 LAB — CBC
HCT: 30.7 % — ABNORMAL LOW (ref 36.0–46.0)
Hemoglobin: 10 g/dL — ABNORMAL LOW (ref 12.0–15.0)
MCHC: 32.6 g/dL (ref 30.0–36.0)

## 2010-09-16 LAB — COMPREHENSIVE METABOLIC PANEL
Alkaline Phosphatase: 74 U/L (ref 39–117)
BUN: 19 mg/dL (ref 6–23)
CO2: 22 mEq/L (ref 19–32)
GFR calc Af Amer: 57 mL/min — ABNORMAL LOW (ref >60)
GFR calc non Af Amer: 47 mL/min — ABNORMAL LOW (ref >60)
Glucose, Bld: 156 mg/dL — ABNORMAL HIGH (ref 70–99)
Potassium: 3.9 mEq/L (ref 3.5–5.1)
Total Bilirubin: 0.3 mg/dL (ref 0.3–1.2)
Total Protein: 8.1 g/dL (ref 6.0–8.3)

## 2010-09-16 LAB — URINALYSIS, ROUTINE W REFLEX MICROSCOPIC
Bilirubin Urine: NEGATIVE
Hgb urine dipstick: NEGATIVE
Nitrite: NEGATIVE
Specific Gravity, Urine: 1.018 (ref 1.005–1.030)
pH: 6 (ref 5.0–8.0)

## 2010-09-16 LAB — WET PREP, GENITAL: Clue Cells Wet Prep HPF POC: NONE SEEN

## 2010-09-16 LAB — DIFFERENTIAL
Basophils Absolute: 0 10*3/uL (ref 0.0–0.1)
Lymphocytes Relative: 44 % (ref 12–46)
Monocytes Absolute: 0.5 10*3/uL (ref 0.1–1.0)
Monocytes Relative: 6 % (ref 3–12)
Neutro Abs: 3.3 10*3/uL (ref 1.7–7.7)

## 2010-09-16 LAB — LIPASE, BLOOD: Lipase: 43 U/L (ref 11–59)

## 2010-10-02 ENCOUNTER — Emergency Department (HOSPITAL_COMMUNITY): Payer: Self-pay

## 2010-10-02 ENCOUNTER — Emergency Department (HOSPITAL_COMMUNITY)
Admission: EM | Admit: 2010-10-02 | Discharge: 2010-10-02 | Disposition: A | Discharge status: Home / Self Care | Payer: Self-pay | Attending: Emergency Medicine | Admitting: Emergency Medicine

## 2010-10-02 DIAGNOSIS — Z79899 Other long term (current) drug therapy: Secondary | ICD-10-CM | POA: Insufficient documentation

## 2010-10-02 DIAGNOSIS — S7000XA Contusion of unspecified hip, initial encounter: Secondary | ICD-10-CM | POA: Insufficient documentation

## 2010-10-02 DIAGNOSIS — F341 Dysthymic disorder: Secondary | ICD-10-CM | POA: Insufficient documentation

## 2010-10-02 DIAGNOSIS — M129 Arthropathy, unspecified: Secondary | ICD-10-CM | POA: Insufficient documentation

## 2010-10-02 DIAGNOSIS — E119 Type 2 diabetes mellitus without complications: Secondary | ICD-10-CM | POA: Insufficient documentation

## 2010-10-02 DIAGNOSIS — T24039A Burn of unspecified degree of unspecified lower leg, initial encounter: Secondary | ICD-10-CM | POA: Insufficient documentation

## 2010-10-02 DIAGNOSIS — M79609 Pain in unspecified limb: Secondary | ICD-10-CM | POA: Insufficient documentation

## 2010-10-02 DIAGNOSIS — Y92009 Unspecified place in unspecified non-institutional (private) residence as the place of occurrence of the external cause: Secondary | ICD-10-CM | POA: Insufficient documentation

## 2010-10-02 DIAGNOSIS — X19XXXA Contact with other heat and hot substances, initial encounter: Secondary | ICD-10-CM | POA: Insufficient documentation

## 2010-10-02 DIAGNOSIS — S20229A Contusion of unspecified back wall of thorax, initial encounter: Secondary | ICD-10-CM | POA: Insufficient documentation

## 2010-10-02 DIAGNOSIS — IMO0002 Reserved for concepts with insufficient information to code with codable children: Secondary | ICD-10-CM | POA: Insufficient documentation

## 2010-10-02 DIAGNOSIS — Z794 Long term (current) use of insulin: Secondary | ICD-10-CM | POA: Insufficient documentation

## 2010-10-02 DIAGNOSIS — M533 Sacrococcygeal disorders, not elsewhere classified: Secondary | ICD-10-CM | POA: Insufficient documentation

## 2010-10-13 LAB — POCT I-STAT 3, VENOUS BLOOD GAS (G3P V)
Acid-Base Excess: 2
Bicarbonate: 26.5 — ABNORMAL HIGH
O2 Saturation: 63
Operator id: 270651
Patient temperature: 97.3
TCO2: 28
pCO2, Ven: 38.5 — ABNORMAL LOW
pH, Ven: 7.443 — ABNORMAL HIGH
pO2, Ven: 30

## 2010-10-13 LAB — URINE MICROSCOPIC-ADD ON

## 2010-10-13 LAB — URINALYSIS, ROUTINE W REFLEX MICROSCOPIC
Bilirubin Urine: NEGATIVE
Glucose, UA: >1000 — AB
Ketones, ur: NEGATIVE
Leukocytes, UA: NEGATIVE
Nitrite: NEGATIVE
Protein, ur: NEGATIVE
Specific Gravity, Urine: 1.043 — ABNORMAL HIGH
Urobilinogen, UA: 0.2
pH: 6

## 2010-10-13 LAB — POCT PREGNANCY, URINE
Operator id: 27065
Preg Test, Ur: NEGATIVE

## 2010-10-14 LAB — POCT I-STAT, CHEM 8
BUN: 7
BUN: 9
BUN: 9
Calcium, Ion: 1 — ABNORMAL LOW
Calcium, Ion: 1.09 — ABNORMAL LOW
Calcium, Ion: 1.17
Chloride: 101
Chloride: 103
Chloride: 99
Creatinine, Ser: 0.8
Creatinine, Ser: 0.9
Creatinine, Ser: 1.1
Glucose, Bld: 176 — ABNORMAL HIGH
Glucose, Bld: 442 — ABNORMAL HIGH
Glucose, Bld: 465 — ABNORMAL HIGH
HCT: 39
HCT: 45
HCT: 45
Hemoglobin: 13.3
Hemoglobin: 15.3 — ABNORMAL HIGH
Hemoglobin: 15.3 — ABNORMAL HIGH
Potassium: 3.6
Potassium: 5.1
Potassium: 5.3 — ABNORMAL HIGH
Sodium: 131 — ABNORMAL LOW
Sodium: 132 — ABNORMAL LOW
Sodium: 140
TCO2: 24
TCO2: 24
TCO2: 26

## 2010-10-14 LAB — CBC
HCT: 41.7
Hemoglobin: 13.6
MCHC: 32.7
MCV: 79.7
Platelets: 218
RBC: 5.23 — ABNORMAL HIGH
RDW: 13.1
WBC: 7.2

## 2010-10-14 LAB — URINE MICROSCOPIC-ADD ON

## 2010-10-14 LAB — BASIC METABOLIC PANEL
BUN: 6
CO2: 24
Calcium: 9.4
GFR calc non Af Amer: >60
Glucose, Bld: 451 — ABNORMAL HIGH
Potassium: 5

## 2010-10-14 LAB — DIFFERENTIAL
Basophils Absolute: 0
Basophils Relative: 0
Eosinophils Absolute: 0.2
Eosinophils Relative: 3
Lymphocytes Relative: 27
Lymphs Abs: 1.9
Monocytes Absolute: 0.8
Monocytes Relative: 11
Neutro Abs: 4.2
Neutrophils Relative %: 59

## 2010-10-14 LAB — POCT I-STAT 3, ART BLOOD GAS (G3+)
Bicarbonate: 24.2 — ABNORMAL HIGH
O2 Saturation: 95
Operator id: 284031
Patient temperature: 98
TCO2: 25
pCO2 arterial: 35.2
pH, Arterial: 7.443 — ABNORMAL HIGH
pO2, Arterial: 71 — ABNORMAL LOW

## 2010-10-14 LAB — BASIC METABOLIC PANEL WITH GFR
Chloride: 94 — ABNORMAL LOW
Creatinine, Ser: 0.83
GFR calc Af Amer: >60
Sodium: 132 — ABNORMAL LOW

## 2010-10-14 LAB — URINALYSIS, ROUTINE W REFLEX MICROSCOPIC
Bilirubin Urine: NEGATIVE
Glucose, UA: >1000 — AB
Hgb urine dipstick: NEGATIVE
Ketones, ur: 15 — AB
Leukocytes, UA: NEGATIVE
Nitrite: NEGATIVE
Protein, ur: NEGATIVE
Specific Gravity, Urine: 1.042 — ABNORMAL HIGH
Urobilinogen, UA: 1
pH: 6

## 2010-10-14 LAB — POCT PREGNANCY, URINE
Operator id: 288331
Preg Test, Ur: NEGATIVE

## 2010-10-19 LAB — URINALYSIS, ROUTINE W REFLEX MICROSCOPIC
Ketones, ur: NEGATIVE
Leukocytes, UA: NEGATIVE
Nitrite: NEGATIVE
Specific Gravity, Urine: 1.042 — ABNORMAL HIGH
pH: 6

## 2010-10-19 LAB — DIFFERENTIAL
Lymphocytes Relative: 40
Lymphs Abs: 2.4
Neutro Abs: 3
Neutrophils Relative %: 50

## 2010-10-19 LAB — URINE MICROSCOPIC-ADD ON

## 2010-10-19 LAB — BASIC METABOLIC PANEL
BUN: 8
Creatinine, Ser: 0.8
GFR calc non Af Amer: >60

## 2010-10-19 LAB — CBC
Platelets: 221
WBC: 6.1

## 2010-10-19 LAB — GLUCOSE, CAPILLARY: Glucose-Capillary: 352 — ABNORMAL HIGH

## 2010-10-19 LAB — POCT PREGNANCY, URINE: Preg Test, Ur: NEGATIVE

## 2010-10-20 LAB — URINALYSIS, ROUTINE W REFLEX MICROSCOPIC
Hgb urine dipstick: NEGATIVE
Nitrite: NEGATIVE
Specific Gravity, Urine: 1.037 — ABNORMAL HIGH
Urobilinogen, UA: 1

## 2010-10-20 LAB — URINE MICROSCOPIC-ADD ON

## 2010-10-20 LAB — BASIC METABOLIC PANEL
BUN: 9
Calcium: 9.3
Creatinine, Ser: 0.76
GFR calc non Af Amer: >60
Glucose, Bld: 204 — ABNORMAL HIGH
Sodium: 135

## 2010-10-20 LAB — GLUCOSE, CAPILLARY

## 2010-10-20 LAB — CBC
Platelets: 290
RDW: 12.9

## 2010-11-03 ENCOUNTER — Emergency Department (HOSPITAL_COMMUNITY)
Admission: EM | Admit: 2010-11-03 | Discharge: 2010-11-03 | Disposition: A | Discharge status: Home / Self Care | Payer: Medicaid Other | Attending: Emergency Medicine | Admitting: Emergency Medicine

## 2010-11-03 ENCOUNTER — Emergency Department (HOSPITAL_COMMUNITY): Payer: Medicaid Other

## 2010-11-03 DIAGNOSIS — F341 Dysthymic disorder: Secondary | ICD-10-CM | POA: Insufficient documentation

## 2010-11-03 DIAGNOSIS — M1990 Unspecified osteoarthritis, unspecified site: Secondary | ICD-10-CM | POA: Insufficient documentation

## 2010-11-03 DIAGNOSIS — N949 Unspecified condition associated with female genital organs and menstrual cycle: Secondary | ICD-10-CM | POA: Insufficient documentation

## 2010-11-03 DIAGNOSIS — R109 Unspecified abdominal pain: Secondary | ICD-10-CM | POA: Insufficient documentation

## 2010-11-03 DIAGNOSIS — R112 Nausea with vomiting, unspecified: Secondary | ICD-10-CM | POA: Insufficient documentation

## 2010-11-03 DIAGNOSIS — O99891 Other specified diseases and conditions complicating pregnancy: Secondary | ICD-10-CM | POA: Insufficient documentation

## 2010-11-03 DIAGNOSIS — N898 Other specified noninflammatory disorders of vagina: Secondary | ICD-10-CM | POA: Insufficient documentation

## 2010-11-03 DIAGNOSIS — E119 Type 2 diabetes mellitus without complications: Secondary | ICD-10-CM | POA: Insufficient documentation

## 2010-11-03 LAB — URINE MICROSCOPIC-ADD ON

## 2010-11-03 LAB — COMPREHENSIVE METABOLIC PANEL
AST: 8 U/L (ref 0–37)
BUN: 15 mg/dL (ref 6–23)
CO2: 24 mEq/L (ref 19–32)
Calcium: 9.5 mg/dL (ref 8.4–10.5)
Chloride: 101 mEq/L (ref 96–112)
Creatinine, Ser: 1.16 mg/dL — ABNORMAL HIGH (ref 0.50–1.10)
GFR calc Af Amer: 71 mL/min — ABNORMAL LOW (ref >90)
GFR calc non Af Amer: 62 mL/min — ABNORMAL LOW (ref >90)
Total Bilirubin: 0.2 mg/dL — ABNORMAL LOW (ref 0.3–1.2)

## 2010-11-03 LAB — URINALYSIS, ROUTINE W REFLEX MICROSCOPIC
Glucose, UA: NEGATIVE mg/dL
Nitrite: NEGATIVE
Protein, ur: 30 mg/dL — AB
pH: 6 (ref 5.0–8.0)

## 2010-11-03 LAB — CBC
Hemoglobin: 9.9 g/dL — ABNORMAL LOW (ref 12.0–15.0)
Platelets: 257 10*3/uL (ref 150–400)
RBC: 3.91 MIL/uL (ref 3.87–5.11)

## 2010-11-03 LAB — DIFFERENTIAL
Basophils Absolute: 0.1 10*3/uL (ref 0.0–0.1)
Basophils Relative: 1 % (ref 0–1)
Eosinophils Absolute: 0.1 10*3/uL (ref 0.0–0.7)
Neutro Abs: 6.7 10*3/uL (ref 1.7–7.7)
Neutrophils Relative %: 62 % (ref 43–77)

## 2010-11-03 LAB — HCG, QUANTITATIVE, PREGNANCY: hCG, Beta Chain, Quant, S: 15827 m[IU]/mL — ABNORMAL HIGH (ref <5)

## 2010-11-03 LAB — POCT PREGNANCY, URINE: Preg Test, Ur: POSITIVE

## 2010-11-03 LAB — LIPASE, BLOOD: Lipase: 29 U/L (ref 11–59)

## 2010-11-04 LAB — GC/CHLAMYDIA PROBE AMP, GENITAL
Chlamydia, DNA Probe: NEGATIVE
GC Probe Amp, Genital: NEGATIVE

## 2010-11-07 ENCOUNTER — Inpatient Hospital Stay (HOSPITAL_COMMUNITY)
Admission: AD | Admit: 2010-11-07 | Discharge: 2010-11-13 | DRG: 781 | Disposition: A | Discharge status: Home / Self Care | Payer: Medicaid Other | Source: Ambulatory Visit | Attending: Obstetrics & Gynecology | Admitting: Obstetrics & Gynecology

## 2010-11-07 ENCOUNTER — Encounter (HOSPITAL_COMMUNITY): Payer: Self-pay | Admitting: *Deleted

## 2010-11-07 DIAGNOSIS — E119 Type 2 diabetes mellitus without complications: Secondary | ICD-10-CM | POA: Diagnosis present

## 2010-11-07 DIAGNOSIS — E860 Dehydration: Secondary | ICD-10-CM | POA: Diagnosis present

## 2010-11-07 DIAGNOSIS — O24919 Unspecified diabetes mellitus in pregnancy, unspecified trimester: Secondary | ICD-10-CM | POA: Diagnosis present

## 2010-11-07 DIAGNOSIS — O211 Hyperemesis gravidarum with metabolic disturbance: Secondary | ICD-10-CM | POA: Diagnosis present

## 2010-11-07 DIAGNOSIS — O10019 Pre-existing essential hypertension complicating pregnancy, unspecified trimester: Principal | ICD-10-CM | POA: Diagnosis present

## 2010-11-07 DIAGNOSIS — E109 Type 1 diabetes mellitus without complications: Secondary | ICD-10-CM

## 2010-11-07 DIAGNOSIS — O10919 Unspecified pre-existing hypertension complicating pregnancy, unspecified trimester: Secondary | ICD-10-CM | POA: Diagnosis present

## 2010-11-07 HISTORY — DX: Mental disorder, not otherwise specified: F99

## 2010-11-07 HISTORY — DX: Major depressive disorder, single episode, unspecified: F32.9

## 2010-11-07 HISTORY — DX: Depression, unspecified: F32.A

## 2010-11-07 LAB — COMPREHENSIVE METABOLIC PANEL
Albumin: 3.6 g/dL (ref 3.5–5.2)
BUN: 11 mg/dL (ref 6–23)
Calcium: 9.8 mg/dL (ref 8.4–10.5)
GFR calc Af Amer: 62 mL/min — ABNORMAL LOW (ref >90)
Glucose, Bld: 152 mg/dL — ABNORMAL HIGH (ref 70–99)
Sodium: 135 mEq/L (ref 135–145)
Total Protein: 7.5 g/dL (ref 6.0–8.3)

## 2010-11-07 LAB — RAPID URINE DRUG SCREEN, HOSP PERFORMED
Amphetamines: NOT DETECTED
Benzodiazepines: NOT DETECTED
Cocaine: NOT DETECTED
Opiates: NOT DETECTED
Tetrahydrocannabinol: POSITIVE — AB

## 2010-11-07 LAB — URINE MICROSCOPIC-ADD ON

## 2010-11-07 LAB — URINALYSIS, ROUTINE W REFLEX MICROSCOPIC
Bilirubin Urine: NEGATIVE
Ketones, ur: 15 mg/dL — AB
Nitrite: NEGATIVE
Specific Gravity, Urine: >1.03 — ABNORMAL HIGH (ref 1.005–1.030)
Urobilinogen, UA: 0.2 mg/dL (ref 0.0–1.0)

## 2010-11-07 LAB — CBC
MCH: 25.2 pg — ABNORMAL LOW (ref 26.0–34.0)
MCHC: 32.8 g/dL (ref 30.0–36.0)
Platelets: 252 10*3/uL (ref 150–400)
RDW: 13.2 % (ref 11.5–15.5)

## 2010-11-07 LAB — DIFFERENTIAL
Eosinophils Absolute: 0 10*3/uL (ref 0.0–0.7)
Lymphs Abs: 2 10*3/uL (ref 0.7–4.0)
Monocytes Absolute: 0.5 10*3/uL (ref 0.1–1.0)
Monocytes Relative: 7 % (ref 3–12)
Neutrophils Relative %: 63 % (ref 43–77)

## 2010-11-07 LAB — HCG, QUANTITATIVE, PREGNANCY: hCG, Beta Chain, Quant, S: 23479 m[IU]/mL — ABNORMAL HIGH (ref <5)

## 2010-11-07 MED ORDER — ACETAMINOPHEN 325 MG PO TABS: 650.0000 mg | ORAL_TABLET | ORAL | Status: DC | PRN

## 2010-11-07 MED ORDER — INSULIN GLARGINE 100 UNIT/ML ~~LOC~~ SOLN
10.0000 [IU] | Freq: Every day | SUBCUTANEOUS | Status: DC
  Administered 2010-11-07: 10 [IU] via SUBCUTANEOUS
  Filled 2010-11-07: qty 3

## 2010-11-07 MED ORDER — METRONIDAZOLE IN NACL 5-0.79 MG/ML-% IV SOLN
500.0000 mg | INTRAVENOUS | Status: AC
  Administered 2010-11-07 (×4): 500 mg via INTRAVENOUS
  Filled 2010-11-07 (×4): qty 100

## 2010-11-07 MED ORDER — PROMETHAZINE HCL 25 MG/ML IJ SOLN
25.0000 mg | Freq: Four times a day (QID) | INTRAMUSCULAR | Status: DC | PRN
  Administered 2010-11-07 – 2010-11-09 (×4): 25 mg via INTRAMUSCULAR
  Filled 2010-11-07 (×4): qty 1

## 2010-11-07 MED ORDER — HYDROMORPHONE HCL 1 MG/ML IJ SOLN
0.2000 mg | Freq: Once | INTRAMUSCULAR | Status: AC
  Administered 2010-11-07: 11:00:00 via INTRAVENOUS
  Filled 2010-11-07: qty 1

## 2010-11-07 MED ORDER — HYDROMORPHONE HCL 1 MG/ML IJ SOLN
0.5000 mg | INTRAMUSCULAR | Status: DC | PRN
  Administered 2010-11-07 – 2010-11-08 (×3): 0.5 mg via INTRAVENOUS
  Administered 2010-11-08: 14:00:00 via INTRAVENOUS
  Administered 2010-11-08 – 2010-11-10 (×8): 0.5 mg via INTRAVENOUS
  Filled 2010-11-07 (×13): qty 1

## 2010-11-07 MED ORDER — FAMOTIDINE IN NACL 20-0.9 MG/50ML-% IV SOLN
20.0000 mg | Freq: Two times a day (BID) | INTRAVENOUS | Status: DC
  Administered 2010-11-07 – 2010-11-09 (×5): 20 mg via INTRAVENOUS
  Filled 2010-11-07 (×5): qty 50

## 2010-11-07 MED ORDER — LEVALBUTEROL HCL 1.25 MG/0.5ML IN NEBU
1.2500 mg | INHALATION_SOLUTION | RESPIRATORY_TRACT | Status: DC | PRN
  Filled 2010-11-07: qty 0.5

## 2010-11-07 MED ORDER — DEXTROSE IN LACTATED RINGERS 5 % IV SOLN: 25.0000 mg | INTRAVENOUS | Status: AC

## 2010-11-07 MED ORDER — KCL-LACTATED RINGERS-D5W 20 MEQ/L IV SOLN: INTRAVENOUS | Status: DC

## 2010-11-07 MED ORDER — PROMETHAZINE HCL 25 MG/ML IJ SOLN
25.0000 mg | Freq: Once | INTRAMUSCULAR | Status: AC
  Administered 2010-11-07: 25 mg via INTRAMUSCULAR

## 2010-11-07 MED ORDER — SERTRALINE HCL 100 MG PO TABS
100.0000 mg | ORAL_TABLET | Freq: Every day | ORAL | Status: DC
  Administered 2010-11-07 – 2010-11-13 (×6): 100 mg via ORAL
  Filled 2010-11-07 (×8): qty 1

## 2010-11-07 MED ORDER — ZOLPIDEM TARTRATE 10 MG PO TABS: 10.0000 mg | ORAL_TABLET | Freq: Every evening | ORAL | Status: DC | PRN

## 2010-11-07 MED ORDER — METOCLOPRAMIDE HCL 5 MG/ML IJ SOLN
10.0000 mg | Freq: Four times a day (QID) | INTRAMUSCULAR | Status: DC | PRN
  Administered 2010-11-12 (×2): 10 mg via INTRAVENOUS
  Filled 2010-11-07 (×2): qty 2

## 2010-11-07 MED ORDER — DOCUSATE SODIUM 100 MG PO CAPS
100.0000 mg | ORAL_CAPSULE | Freq: Every day | ORAL | Status: DC
  Administered 2010-11-09 – 2010-11-13 (×4): 100 mg via ORAL
  Filled 2010-11-07 (×5): qty 1

## 2010-11-07 MED ORDER — METRONIDAZOLE IN NACL 5-0.79 MG/ML-% IV SOLN
2.0000 g | Freq: Once | INTRAVENOUS | Status: DC
  Filled 2010-11-07: qty 400

## 2010-11-07 MED ORDER — PROMETHAZINE HCL 25 MG/ML IJ SOLN
25.0000 mg | Freq: Once | INTRAMUSCULAR | Status: DC
  Filled 2010-11-07: qty 1

## 2010-11-07 MED ORDER — LACTATED RINGERS IV BOLUS (SEPSIS)
1000.0000 mL | Freq: Once | INTRAVENOUS | Status: AC
  Administered 2010-11-07: 1000 mL via INTRAVENOUS

## 2010-11-07 MED ORDER — HYDROMORPHONE BOLUS VIA INFUSION: 0.2000 mg | Freq: Once | INTRAVENOUS | Status: DC

## 2010-11-07 MED ORDER — ALBUTEROL SULFATE HFA 108 (90 BASE) MCG/ACT IN AERS
2.0000 | INHALATION_SPRAY | Freq: Four times a day (QID) | RESPIRATORY_TRACT | Status: DC | PRN
  Filled 2010-11-07: qty 6.7

## 2010-11-07 MED ORDER — LORAZEPAM 2 MG/ML IJ SOLN
2.0000 mg | Freq: Four times a day (QID) | INTRAMUSCULAR | Status: DC | PRN
  Administered 2010-11-07 – 2010-11-11 (×13): 2 mg via INTRAVENOUS
  Filled 2010-11-07 (×13): qty 1

## 2010-11-07 MED ORDER — PROMETHAZINE HCL 25 MG/ML IJ SOLN
25.0000 mg | INTRAVENOUS | Status: DC
  Administered 2010-11-07: 25 mg via INTRAVENOUS
  Filled 2010-11-07: qty 1

## 2010-11-07 MED ORDER — DEXTROSE IN LACTATED RINGERS 5 % IV SOLN: INTRAVENOUS | Status: DC

## 2010-11-07 MED ORDER — INFLUENZA VIRUS VACC SPLIT PF IM SUSP
0.5000 mL | Freq: Once | INTRAMUSCULAR | Status: AC
  Administered 2010-11-07: 0.5 mL via INTRAMUSCULAR
  Filled 2010-11-07: qty 0.5

## 2010-11-07 MED ORDER — PRENATAL PLUS 27-1 MG PO TABS
1.0000 | ORAL_TABLET | Freq: Every day | ORAL | Status: DC
  Administered 2010-11-09 – 2010-11-11 (×2): 1 via ORAL
  Filled 2010-11-07 (×3): qty 1

## 2010-11-07 MED ORDER — POTASSIUM CHLORIDE 2 MEQ/ML IV SOLN
INTRAVENOUS | Status: DC
  Administered 2010-11-07 – 2010-11-09 (×5): via INTRAVENOUS
  Filled 2010-11-07 (×7): qty 1000

## 2010-11-07 MED ORDER — ONDANSETRON HCL 4 MG/2ML IJ SOLN
4.0000 mg | Freq: Four times a day (QID) | INTRAMUSCULAR | Status: DC | PRN
  Administered 2010-11-07 – 2010-11-12 (×6): 4 mg via INTRAVENOUS
  Filled 2010-11-07 (×6): qty 2

## 2010-11-07 MED ORDER — INSULIN ASPART 100 UNIT/ML ~~LOC~~ SOLN
0.0000 [IU] | Freq: Four times a day (QID) | SUBCUTANEOUS | Status: DC
  Administered 2010-11-07 – 2010-11-08 (×6): 3 [IU] via SUBCUTANEOUS
  Administered 2010-11-09: 2 [IU] via SUBCUTANEOUS
  Administered 2010-11-09: 3 [IU] via SUBCUTANEOUS
  Administered 2010-11-10: 5 [IU] via SUBCUTANEOUS
  Administered 2010-11-10 (×2): 3 [IU] via SUBCUTANEOUS
  Administered 2010-11-10: 2 [IU] via SUBCUTANEOUS
  Administered 2010-11-11: 5 [IU] via SUBCUTANEOUS
  Administered 2010-11-11 (×2): 3 [IU] via SUBCUTANEOUS
  Administered 2010-11-12: 2 [IU] via SUBCUTANEOUS
  Administered 2010-11-12: 4 [IU] via SUBCUTANEOUS
  Administered 2010-11-13 (×2): 3 [IU] via SUBCUTANEOUS
  Administered 2010-11-13: 5 [IU] via SUBCUTANEOUS
  Filled 2010-11-07: qty 3

## 2010-11-07 MED ORDER — CALCIUM CARBONATE ANTACID 500 MG PO CHEW: 2.0000 | CHEWABLE_TABLET | ORAL | Status: DC | PRN

## 2010-11-07 MED ORDER — ONDANSETRON HCL 4 MG/2ML IJ SOLN
4.0000 mg | Freq: Once | INTRAMUSCULAR | Status: AC
  Administered 2010-11-07: 4 mg via INTRAVENOUS
  Filled 2010-11-07: qty 2

## 2010-11-07 NOTE — ED Provider Notes (Signed)
Attestation of Attending Supervision of Advanced Practitioner: Evaluation and management procedures were performed by the PA/NP/CNM/OB Fellow under my supervision/collaboration. Chart reviewed and agree with management and plan.  Patient will be admitted for management of her nausea and vomiting in the setting of a [redacted]w[redacted]d gestation (viable IUP on 11/03/10 ultrasound) and poorly managed Type I DM (fasting BS in 150s; patient reported she has not taken her insulin in over 6 months), in addition to documented cocaine dependence (patient says she last used about 3-4 days ago).  Also noted to have trichomonas in urine.  Patient will benefit from glycemic control, antiemetics, treatment of her STI, SW consult, and treatment of possible cocaine withdrawal symptoms.  Will also provide routine antenatal care.  Jalayia Bagheri A 11/07/2010 11:01 AM

## 2010-11-07 NOTE — Progress Notes (Signed)
G4P2 ECTopic 1. 6wks preg with n/v x 3 days. Unable to keep down anything. No diarrhea

## 2010-11-07 NOTE — H&P (Signed)
Please refer to ED provider note for admission details.  I have seen and examined the patient and agree with the findings, assessment and plan of the ED provider.  Milen Lengacher A 11/07/2010 11:08 AM

## 2010-11-07 NOTE — ED Provider Notes (Signed)
Assumed care from Sherolyn Buba, NP at 0800.  Addendum 30 yo G34 P2012 at [redacted]w[redacted]d presented by EMS for intractable nausea and vomiting, not retaining solid food for 3 days.she continues to actively vomiting and wretch despite IM Phenergan and LR IVF. Will add Zofran 8 mg IV push.  She is a Type I diabetic not taking her insulin. Viable IUP by [place] 11/03/10 confirms LMP dating making her [redacted]w[redacted]d. Desired pregnancy. Significant Ob Hx: NSVD x 2, ectopic x 1. Last birth 3 yrs ago.  Significant recent labs: 07/08/10: TSH nl, creat 1.94, hgb 9.9; 06/21/10: UDS pos cocaine and opiates; Glucose 10/30/10: 139, prior to that 08/18/10: 142 vitamin Filed Vitals:   11/07/10 0640  BP: 128/82  Pulse: 107  Temp: 98.3 F (36.8 C)  Resp: 20    Results for orders placed during the hospital encounter of 11/07/10 (from the past 24 hour(s))  URINALYSIS, ROUTINE W REFLEX MICROSCOPIC     Status: Abnormal   Collection Time   11/07/10  6:35 AM      Component Value Range   Color, Urine YELLOW  YELLOW    Appearance HAZY (*) CLEAR    Specific Gravity, Urine >1.030 (*) 1.005 - 1.030    pH 6.0  5.0 - 8.0    Glucose, UA NEGATIVE  NEGATIVE (mg/dL)   Hgb urine dipstick SMALL (*) NEGATIVE    Bilirubin Urine NEGATIVE  NEGATIVE    Ketones, ur 15 (*) NEGATIVE (mg/dL)   Protein, ur 100 (*) NEGATIVE (mg/dL)   Urobilinogen, UA 0.2  0.0 - 1.0 (mg/dL)   Nitrite NEGATIVE  NEGATIVE    Leukocytes, UA MODERATE (*) NEGATIVE   URINE MICROSCOPIC-ADD ON     Status: Abnormal   Collection Time   11/07/10  6:35 AM      Component Value Range   Squamous Epithelial / LPF RARE  RARE    WBC, UA 21-50  <3 (WBC/hpf)   RBC / HPF 3-6  <3 (RBC/hpf)   Bacteria, UA FEW (*) RARE    Urine-Other TRICHOMONAS PRESENT    POCT PREGNANCY, URINE     Status: Normal   Collection Time   11/07/10  7:17 AM      Component Value Range   Preg Test, Ur POSITIVE    COMPREHENSIVE METABOLIC PANEL     Status: Abnormal   Collection Time   11/07/10  9:05 AM   Component Value Range   Sodium 135  135 - 145 (mEq/L)   Potassium 4.0  3.5 - 5.1 (mEq/L)   Chloride 101  96 - 112 (mEq/L)   CO2 25  19 - 32 (mEq/L)   Glucose, Bld 152 (*) 70 - 99 (mg/dL)   BUN 11  6 - 23 (mg/dL)   Creatinine, Ser 1.30 (*) 0.50 - 1.10 (mg/dL)   Calcium 9.8  8.4 - 10.5 (mg/dL)   Total Protein 7.5  6.0 - 8.3 (g/dL)   Albumin 3.6  3.5 - 5.2 (g/dL)   AST 9  0 - 37 (U/L)   ALT 5  0 - 35 (U/L)   Alkaline Phosphatase 73  39 - 117 (U/L)   Total Bilirubin 0.2 (*) 0.3 - 1.2 (mg/dL)   GFR calc non Af Amer 54 (*) >90 (mL/min)   GFR calc Af Amer 62 (*) >90 (mL/min)  CBC     Status: Abnormal   Collection Time   11/07/10  9:05 AM      Component Value Range   WBC 7.1  4.0 -  10.5 (K/uL)   RBC 3.89  3.87 - 5.11 (MIL/uL)   Hemoglobin 9.8 (*) 12.0 - 15.0 (g/dL)   HCT 29.9 (*) 36.0 - 46.0 (%)   MCV 76.9 (*) 78.0 - 100.0 (fL)   MCH 25.2 (*) 26.0 - 34.0 (pg)   MCHC 32.8  30.0 - 36.0 (g/dL)   RDW 13.2  11.5 - 15.5 (%)   Platelets 252  150 - 400 (K/uL)  HCG, QUANTITATIVE, PREGNANCY     Status: Abnormal   Collection Time   11/07/10  9:05 AM      Component Value Range   hCG, Beta Chain, Quant, S 23479 (*) <5 (mIU/mL)    Discussed inpt management with Dr. Harolyn Rutherford.

## 2010-11-08 DIAGNOSIS — O10919 Unspecified pre-existing hypertension complicating pregnancy, unspecified trimester: Secondary | ICD-10-CM | POA: Diagnosis present

## 2010-11-08 LAB — COMPREHENSIVE METABOLIC PANEL
ALT: 5 U/L (ref 0–35)
Albumin: 3 g/dL — ABNORMAL LOW (ref 3.5–5.2)
Alkaline Phosphatase: 68 U/L (ref 39–117)
BUN: 6 mg/dL (ref 6–23)
Chloride: 98 mEq/L (ref 96–112)
GFR calc Af Amer: 75 mL/min — ABNORMAL LOW (ref >90)
Glucose, Bld: 192 mg/dL — ABNORMAL HIGH (ref 70–99)
Potassium: 3.7 mEq/L (ref 3.5–5.1)
Total Bilirubin: 0.2 mg/dL — ABNORMAL LOW (ref 0.3–1.2)

## 2010-11-08 LAB — URINE CULTURE

## 2010-11-08 LAB — CREATININE, URINE, 24 HOUR
Collection Interval-UCRE24: 24 hours
Creatinine, 24H Ur: 1292 mg/d (ref 700–1800)
Creatinine, Urine: 123.1 mg/dL

## 2010-11-08 LAB — GLUCOSE, CAPILLARY
Glucose-Capillary: 160 mg/dL — ABNORMAL HIGH (ref 70–99)
Glucose-Capillary: 177 mg/dL — ABNORMAL HIGH (ref 70–99)

## 2010-11-08 MED ORDER — AMLODIPINE BESYLATE 10 MG PO TABS
10.0000 mg | ORAL_TABLET | Freq: Every day | ORAL | Status: DC
  Administered 2010-11-08 – 2010-11-13 (×5): 10 mg via ORAL
  Filled 2010-11-08 (×7): qty 1

## 2010-11-08 MED ORDER — INSULIN NPH (HUMAN) (ISOPHANE) 100 UNIT/ML ~~LOC~~ SUSP
11.0000 [IU] | SUBCUTANEOUS | Status: DC
  Administered 2010-11-08 – 2010-11-10 (×6): 11 [IU] via SUBCUTANEOUS
  Filled 2010-11-08: qty 10

## 2010-11-08 NOTE — Progress Notes (Signed)
Hospital Day #2  Subjective: Patient reports nausea, vomiting and no problems voiding.  Taking Ativan for anxiety; tolerating sips of ginger ale and clears. Reports abdominal pain associated with vomiting, denies VB, LOF.  Objective: I have reviewed patient's vital signs, medications and labs. Fasting CBG was 187. General: no distress Resp: clear to auscultation bilaterally Cardio: regular rate and rhythm GI: soft, non-tender; bowel sounds normal; no masses,  no organomegaly Extremities: extremities normal, atraumatic, no cyanosis or edema and Homans sign is negative, no sign of DVT Vaginal Bleeding: none  Results for orders placed during the hospital encounter of 11/07/10 (from the past 24 hour(s))  COMPREHENSIVE METABOLIC PANEL     Status: Abnormal   Collection Time   11/07/10  9:05 AM      Component Value Range   Sodium 135  135 - 145 (mEq/L)   Potassium 4.0  3.5 - 5.1 (mEq/L)   Chloride 101  96 - 112 (mEq/L)   CO2 25  19 - 32 (mEq/L)   Glucose, Bld 152 (*) 70 - 99 (mg/dL)   BUN 11  6 - 23 (mg/dL)   Creatinine, Ser 1.30 (*) 0.50 - 1.10 (mg/dL)   Calcium 9.8  8.4 - 10.5 (mg/dL)   Total Protein 7.5  6.0 - 8.3 (g/dL)   Albumin 3.6  3.5 - 5.2 (g/dL)   AST 9  0 - 37 (U/L)   ALT 5  0 - 35 (U/L)   Alkaline Phosphatase 73  39 - 117 (U/L)   Total Bilirubin 0.2 (*) 0.3 - 1.2 (mg/dL)   GFR calc non Af Amer 54 (*) >90 (mL/min)   GFR calc Af Amer 62 (*) >90 (mL/min)  CBC     Status: Abnormal   Collection Time   11/07/10  9:05 AM      Component Value Range   WBC 7.1  4.0 - 10.5 (K/uL)   RBC 3.89  3.87 - 5.11 (MIL/uL)   Hemoglobin 9.8 (*) 12.0 - 15.0 (g/dL)   HCT 29.9 (*) 36.0 - 46.0 (%)   MCV 76.9 (*) 78.0 - 100.0 (fL)   MCH 25.2 (*) 26.0 - 34.0 (pg)   MCHC 32.8  30.0 - 36.0 (g/dL)   RDW 13.2  11.5 - 15.5 (%)   Platelets 252  150 - 400 (K/uL)  HCG, QUANTITATIVE, PREGNANCY     Status: Abnormal   Collection Time   11/07/10  9:05 AM      Component Value Range   hCG, Beta Chain,  Quant, S 23479 (*) <5 (mIU/mL)  HEMOGLOBIN A1C     Status: Abnormal   Collection Time   11/07/10  9:05 AM      Component Value Range   Hemoglobin A1C 8.0 (*) <5.7 (%)   Mean Plasma Glucose 183 (*) <117 (mg/dL)  DIFFERENTIAL     Status: Normal   Collection Time   11/07/10  9:05 AM      Component Value Range   Neutrophils Relative 63  43 - 77 (%)   Neutro Abs 4.4  1.7 - 7.7 (K/uL)   Lymphocytes Relative 29  12 - 46 (%)   Lymphs Abs 2.0  0.7 - 4.0 (K/uL)   Monocytes Relative 7  3 - 12 (%)   Monocytes Absolute 0.5  0.1 - 1.0 (K/uL)   Eosinophils Relative 0  0 - 5 (%)   Eosinophils Absolute 0.0  0.0 - 0.7 (K/uL)   Basophils Relative 0  0 - 1 (%)   Basophils Absolute  0.0  0.0 - 0.1 (K/uL)  GLUCOSE, CAPILLARY     Status: Abnormal   Collection Time   11/07/10 12:29 PM      Component Value Range   Glucose-Capillary 160 (*) 70 - 99 (mg/dL)  GLUCOSE, CAPILLARY     Status: Abnormal   Collection Time   11/07/10  6:14 PM      Component Value Range   Glucose-Capillary 175 (*) 70 - 99 (mg/dL)  GLUCOSE, CAPILLARY     Status: Abnormal   Collection Time   11/08/10 12:11 AM      Component Value Range   Glucose-Capillary 177 (*) 70 - 99 (mg/dL)  COMPREHENSIVE METABOLIC PANEL     Status: Abnormal   Collection Time   11/08/10  5:33 AM      Component Value Range   Sodium 131 (*) 135 - 145 (mEq/L)   Potassium 3.7  3.5 - 5.1 (mEq/L)   Chloride 98  96 - 112 (mEq/L)   CO2 26  19 - 32 (mEq/L)   Glucose, Bld 192 (*) 70 - 99 (mg/dL)   BUN 6  6 - 23 (mg/dL)   Creatinine, Ser 1.11 (*) 0.50 - 1.10 (mg/dL)   Calcium 9.9  8.4 - 10.5 (mg/dL)   Total Protein 7.4  6.0 - 8.3 (g/dL)   Albumin 3.0 (*) 3.5 - 5.2 (g/dL)   AST 9  0 - 37 (U/L)   ALT 5  0 - 35 (U/L)   Alkaline Phosphatase 68  39 - 117 (U/L)   Total Bilirubin 0.2 (*) 0.3 - 1.2 (mg/dL)   GFR calc non Af Amer 65 (*) >90 (mL/min)   GFR calc Af Amer 75 (*) >90 (mL/min)  GLUCOSE, CAPILLARY     Status: Abnormal   Collection Time   11/08/10  5:58  AM      Component Value Range   Glucose-Capillary 187 (*) 70 - 99 (mg/dL)    Assessment/Plan: Patient Active Problem List  Diagnoses Date Noted  . Chronic hypertension in pregnancy 11/08/2010  . Type 2 diabetes mellitus 11/07/2010  . Hyperemesis gravidarum before end of [redacted] week gestation, dehydration 11/07/2010  Started Norvasc 10 mg po daily Continue DM and hyperemesis management; changed insulin to NPH 11/11 and will continue Novolog SS Routine antenatal care; will likely see DM educator tomorrow.   LOS: 1 day    Cicero Noy A 11/08/2010, 7:53 AM

## 2010-11-08 NOTE — Progress Notes (Signed)
PSYCHOSOCIAL ASSESSMENT ~ MATERNAL/CHILD Name: [name] Age: 30 Referral Date: 11/08/10 Reason/Source: current drug use  I. FAMILY/HOME ENVIRONMENT A. Child's Legal Guardian Name: Kamreigh Mocarski  DOB: December 17, 1980                                                 Age: 41                   [place] [address]  Name: DOB:                                                  Age: Address:   B. Other Household Members/Support Persons Name: Relationship:                    DOB:        Name:                    Relationship:               DOB:        Name:                         Relationship:               DOB:                   Name:                   Relationship:               DOB: C.   Other Support:   II. PSYCHOSOCIAL DATA A. Information Source: pt                    B. 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 Cultural and Environment Information Cultural Issues Impacting Care: NO III. STRENGTHS  Supportive family/friends: YES             Adequate Resources: YES             Compliance with medical plan: YES             Home prepared for Child (including basic supplies): yes, will let CSW know if any issues closer to birth.             Understanding of Illness:YES              Other:   IV. RISK FACTORS AND CURRENT PROBLEMS       No Problems Noted               Substance abuse:                                    Pt: positive for MJ, hx of cocaine use.           Family:             Family/Relationship Issues:                      Pt:            Family:             Financial Resources:                               Pt:            Family:             DSS Involvement:                                    Pt:             Family:             Knowledge/Cognitive Deficit:                   Pt:             Family:                Basic Needs(food, housing, etc.)             Pt:             Family:             Mental Illness:                                           Pt:             Family:             Abuse/Neglect/Domestic Violence           Pt:             Family:             Transportation:  Pt:              Family:             Adjustment to Illness:                               Pt:              Family:             Compliance with Treatment:                    Pt:              Family:             Housing Concerns                                   Pt:              Family:             Other:               V. SOCIAL WORK ASSESSMENT  CSW met with pt about current drug use.  Explained if infant born with substances in there system either detected by drug screen or [facility] screen, CSW will have to contact CPS.  Pt states has 3 other children who live her and sometimes stays with her sister.  Pt had questions about Medicaid and WIC, CSW provided her with applications for both.  Pt did not voice any other concerns with CSW.  Please reconsult if further needs arise.     VI. SOCIAL WORK PLAN (in bold)             *No Further Intervention Required/ No Barriers to Discharge*             Psychosocial Support and Ongoing Assessment if Needs             Patient/Family Education             Child Protective Services Report                      [place]:                       Date:             Information/Referral to [employer]             Other

## 2010-11-09 DIAGNOSIS — E109 Type 1 diabetes mellitus without complications: Secondary | ICD-10-CM

## 2010-11-09 LAB — AMYLASE: Amylase: 92 U/L (ref 0–105)

## 2010-11-09 LAB — GLUCOSE, CAPILLARY
Glucose-Capillary: 154 mg/dL — ABNORMAL HIGH (ref 70–99)
Glucose-Capillary: 139 mg/dL — ABNORMAL HIGH (ref 70–99)
Glucose-Capillary: 118 mg/dL — ABNORMAL HIGH (ref 70–99)

## 2010-11-09 LAB — PROTEIN, URINE, 24 HOUR: Protein, Urine: 23 mg/dL

## 2010-11-09 LAB — LIPASE, BLOOD: Lipase: 22 U/L (ref 11–59)

## 2010-11-09 MED ORDER — METHYLPREDNISOLONE 16 MG PO TABS
16.0000 mg | ORAL_TABLET | Freq: Every day | ORAL | Status: AC
  Administered 2010-11-11: 16 mg via ORAL
  Filled 2010-11-09: qty 1

## 2010-11-09 MED ORDER — INSULIN PEN STARTER KIT
1.0000 | Freq: Once | Status: AC
  Administered 2010-11-09: 1
  Filled 2010-11-09: qty 1

## 2010-11-09 MED ORDER — PROMETHAZINE HCL 25 MG/ML IJ SOLN
25.0000 mg | INTRAVENOUS | Status: DC
  Administered 2010-11-09 – 2010-11-12 (×8): 25 mg via INTRAVENOUS
  Filled 2010-11-09 (×11): qty 1

## 2010-11-09 MED ORDER — METHYLPREDNISOLONE 4 MG PO TABS
8.0000 mg | ORAL_TABLET | Freq: Every day | ORAL | Status: DC
  Administered 2010-11-12: 8 mg via ORAL
  Filled 2010-11-09 (×3): qty 2

## 2010-11-09 MED ORDER — METHYLPREDNISOLONE 16 MG PO TABS
16.0000 mg | ORAL_TABLET | Freq: Every day | ORAL | Status: AC
  Administered 2010-11-11 – 2010-11-13 (×3): 16 mg via ORAL
  Filled 2010-11-09 (×3): qty 1

## 2010-11-09 MED ORDER — METHYLPREDNISOLONE 4 MG PO TABS: 4.0000 mg | ORAL_TABLET | Freq: Every day | ORAL | Status: DC

## 2010-11-09 MED ORDER — METHYLPREDNISOLONE 4 MG PO TABS
8.0000 mg | ORAL_TABLET | Freq: Every day | ORAL | Status: DC
  Filled 2010-11-09: qty 2

## 2010-11-09 MED ORDER — FAMOTIDINE 20 MG PO TABS
20.0000 mg | ORAL_TABLET | Freq: Two times a day (BID) | ORAL | Status: DC
  Filled 2010-11-09 (×2): qty 1

## 2010-11-09 MED ORDER — METHYLPREDNISOLONE SODIUM SUCC 40 MG IJ SOLR
16.0000 mg | INTRAMUSCULAR | Status: AC
  Administered 2010-11-10 (×3): 16 mg via INTRAVENOUS
  Filled 2010-11-09 (×3): qty 0.4

## 2010-11-09 MED ORDER — METHYLPREDNISOLONE 16 MG PO TABS
16.0000 mg | ORAL_TABLET | Freq: Every day | ORAL | Status: AC
  Administered 2010-11-11 – 2010-11-12 (×2): 16 mg via ORAL
  Filled 2010-11-09 (×2): qty 1

## 2010-11-09 MED ORDER — METHYLPREDNISOLONE SODIUM SUCC 40 MG IJ SOLR
24.0000 mg | Freq: Four times a day (QID) | INTRAMUSCULAR | Status: AC
  Administered 2010-11-09 (×2): 24 mg via INTRAVENOUS
  Filled 2010-11-09 (×2): qty 0.6

## 2010-11-09 NOTE — Progress Notes (Signed)
  Hospital Day #2  Subjective: Patient reports nausea, vomiting and no problems voiding.  Taking Ativan for anxiety; tolerating sips of ginger ale and clears. Reports abdominal pain localized in epigastric region. Patient denies VB or cramping.  Objective: Blood pressure 113/86, pulse 120, temperature 98.6 F (37 C), temperature source Oral, resp. rate 18, height 5\' 7"  (1.702 m), weight 82.441 kg (181 lb 12 oz), last menstrual period 09/19/2010, SpO2 99.00%. Fasting CBG was 155. General: no distress Resp: clear to auscultation bilaterally Cardio: regular rate and rhythm GI: soft, non-tender; bowel sounds normal; no masses,  no organomegaly Extremities: extremities normal, atraumatic, no cyanosis or edema and Homans sign is negative, no sign of DVT Vaginal Bleeding: none  Results for orders placed during the hospital encounter of 11/07/10 (from the past 24 hour(s))  GLUCOSE, CAPILLARY     Status: Abnormal   Collection Time   11/08/10 12:07 PM      Component Value Range   Glucose-Capillary 177 (*) 70 - 99 (mg/dL)  GLUCOSE, CAPILLARY     Status: Abnormal   Collection Time   11/08/10  6:02 PM      Component Value Range   Glucose-Capillary 190 (*) 70 - 99 (mg/dL)  GLUCOSE, CAPILLARY     Status: Abnormal   Collection Time   11/08/10 11:50 PM      Component Value Range   Glucose-Capillary 181 (*) 70 - 99 (mg/dL)  GLUCOSE, CAPILLARY     Status: Abnormal   Collection Time   11/09/10  5:49 AM      Component Value Range   Glucose-Capillary 154 (*) 70 - 99 (mg/dL)    Assessment/Plan: 30 yo G4P2012 at [redacted]w[redacted]d with CHTN, type II DM admitted with hyperemesis gravidarum - Patient with persistent emesis. Will start continuous phenergan infusion - will check TSH, amylase and lipase - BP better controlled on Norvasc - CBG slowly improving. Will consult with diabetic coordinator today - continue current care   LOS: 2 days    Aashrith Eves 11/09/2010, 7:14 AM

## 2010-11-09 NOTE — Progress Notes (Signed)
Inpatient Diabetes Program Recommendations  Diabetes in pregnancy: AACE/ADA Target Ranges:  Fasting < 90 mg/dl 2 hrs post-prandial < 120 mg/dl   Reason for Visit:  Consul for uncontrolled diabetes in pregnancy.  No meal coverage yet ordered.  CBG's need be ordered fasting and 2 hr post-prandial; correction to be given fasting and 2 hr post-prandial at that time (see instructions for scale below). Please refer to diabetic pregnant patient orders link for insulin needs at weight and gestational age.  Inpatient Diabetes Program Recommendations Insulin - Basal: Continue to increase HS NPH until fasting cbg's are less than or equal to 90 mg/dL (once HS cbg's are normalized Correction (SSI): Please refer to "Diabetic Pregnant Orders" to calculate Correction scale based on total daily dose of 57 units insulin per day Insulin - Meal Coverage: 1 unit Novolog per 8 gms carbohydrate HgbA1C: Please order Outpatient Referral: Please order Diet: Added "Gestational diabetes" diet to carb modified diet orders  Note:

## 2010-11-09 NOTE — Consult Note (Signed)
Pharmacy Consult:   MEDROL (METHYLPREDNISOLONE) TAPER  FOR HYPEREMESIS GRAVIDARUM PATIENTS  The following is a 14 day taper of methylprednisolone for hyperemesis. Doses on day 1 and day 2 will be given IV.  All doses starting on day 3  will be given PO. (If patient cannot tolerate oral medications, contact the pharmacy to change route to IV.)   Date Day Morning Midday Bedtime  10/22 1  24mg  24mg   10/23 2 16  mg 16 mg 16 mg  10/24 3 16  mg 16 mg 16 mg  10/25 4 16  mg 8 mg 16 mg  10/26 5 16  mg 8 mg 8 mg  10/27 6 8  mg 8 mg 8 mg  10/28 7 8  mg 4 mg 8 mg  10/29 8 8  mg 4 mg 4 mg  10/30 9 8  mg 4 mg   10/31 10 8  mg 4 mg   11/1 11 8  mg    11/2 12 8  mg    11/3 13 4  mg    11/4 14 4  mg     Check fasting blood sugars daily while on the taper. Notify MD if fasting blood sugar>95. Discussed need for close monitoring of blood sugars during steroid taper.  [name] 11/09/2010

## 2010-11-09 NOTE — Progress Notes (Signed)
Patient currently taking some oral medications.  Will change Pepcid to 20mg  orally twice daily.    Per PT IV/PO conversion policy.

## 2010-11-09 NOTE — Progress Notes (Signed)
UR chart review completed.  

## 2010-11-09 NOTE — Progress Notes (Addendum)
INITIAL ADULT NUTRITION ASSESSMENT Date: 11/09/2010   Time: 2:31 PM Reason for Assessment: Diet Education  ASSESSMENT: Female 30 y.o.  Dx: <principal problem not specified> Patient Active Problem List  Diagnoses  . Type 2 diabetes mellitus  . Hyperemesis gravidarum before end of [redacted] week gestation, dehydration  . Chronic hypertension in pregnancy   Hx: HTN, DM II Related Meds:     . amLODipine  10 mg Oral Daily  . docusate sodium  100 mg Oral Daily  . famotidine  20 mg Oral BID  . Flexpen Starter Kit  1 kit Other Once  . insulin aspart  0-15 Units Subcutaneous Q6H  . insulin NPH  11 Units Subcutaneous BH-qamhs  . prenatal vitamin w/FE, FA  1 tablet Oral Daily  . sertraline  100 mg Oral Daily  . DISCONTD: famotidine (PEPCID) IV  20 mg Intravenous Q12H    Ht: 5\' 7"  (170.2 cm)  Wt: 181 lb 12 oz (82.441 kg)  Ideal Wt: 61.6 kg 135 Lbs % Ideal Wt: 134%  Usual Wt: 181 Lbs, verbal report from pt of no weight loss % Usual Wt: 100%  Body mass index is 28.47 kg/(m^2).  Food/Nutrition Related Hx: N/V and elevated CGB's for 3 days PTA  Labs: HA1C 8.0  Intake: I/O last 3 completed shifts: In: 250 [P.O.:250] Out: [id number] [Urine:1300; Emesis/NG output:2] Total I/O In: -  Out: 600 [Urine:600]   Diet Order: Gestational Diabetic. Carbohydrate modified gestational Supplements/Tube Feeding:none  IVF:    promethazine (PHENERGAN) IV infusion Last Rate: 25 mg (11/09/10 0810)  DISCONTD: dextrose 5 % lactated ringers with kcl Last Rate: 125 mL/hr at 11/09/10 0210    Estimated Nutritional Needs:   Kcal: 19-2100 / day Protein: 77-87 g/day Fluid: 2.1 L  NUTRITION DIAGNOSIS: -Food and nutrition related knowledge deficit (NB-1.1).r/t non compilance to Diabetic diet aeb elevated HA1C Status: Ongoing   MONITORING/EVALUATION(Goals): Tolerance of PO diet, resolution of nausea Compliance to Diabetic diet EDUCATION NEEDS: -Education not appropriate at this time Pt pretended to be  alseep during diet education. Would not engage in conversation. Would not read handout on diet for Gestational Diabetes INTERVENTION: Pt will need to be scheduled for outpatient education at a later time when she is willing to attend to education Handout on diet for Gestational Diabetes left at bedside Dietitian [id number]  DOCUMENTATION CODES Per approved criteria  -Not Applicable    Linford Quintela,KATHY 11/09/2010, 2:31 PM

## 2010-11-10 LAB — GLUCOSE, CAPILLARY: Glucose-Capillary: 146 mg/dL — ABNORMAL HIGH (ref 70–99)

## 2010-11-10 MED ORDER — FAMOTIDINE IN NACL 20-0.9 MG/50ML-% IV SOLN
20.0000 mg | Freq: Two times a day (BID) | INTRAVENOUS | Status: DC
  Administered 2010-11-10 – 2010-11-13 (×7): 20 mg via INTRAVENOUS
  Filled 2010-11-10 (×8): qty 50

## 2010-11-10 MED ORDER — HYDROMORPHONE HCL 1 MG/ML IJ SOLN
0.2500 mg | INTRAMUSCULAR | Status: DC | PRN
  Administered 2010-11-10 – 2010-11-11 (×4): 0.25 mg via INTRAVENOUS
  Filled 2010-11-10 (×4): qty 1

## 2010-11-10 NOTE — Progress Notes (Signed)
  Hospital Day #3  S. She says her nausea is better, but she still hasn't been able to keep even sips down.  Her biggest complaint now is that of "back pain", "in the middle", as well as "reflux" pain.  She denies other problems.  She has promised me that she will fill out her medicaid forms today. She declines any more social work visits.    O. Since the medrol taper has started her sugars are around 200 (she is getting sliding scale coverage)     I reviewed her labs and meds.  A/P.  Hyperemesis at 7 1/2 weeks- I am hopeful the steroids will help           Depression- the zoloft was restarted            Reflux- I have stopped the Tums and started IV pepcid           Back pain- her amylase and lipase are normal.  I am worried about a narcotic dependence and am decreasing the amount of dilaudid she receives every 2 hours.

## 2010-11-11 ENCOUNTER — Inpatient Hospital Stay (HOSPITAL_COMMUNITY): Payer: Medicaid Other

## 2010-11-11 LAB — GLUCOSE, CAPILLARY
Glucose-Capillary: 233 mg/dL — ABNORMAL HIGH (ref 70–99)
Glucose-Capillary: 191 mg/dL — ABNORMAL HIGH (ref 70–99)

## 2010-11-11 MED ORDER — LORAZEPAM 1 MG PO TABS
1.0000 mg | ORAL_TABLET | Freq: Four times a day (QID) | ORAL | Status: DC | PRN
  Administered 2010-11-12: 1 mg via ORAL
  Filled 2010-11-11: qty 1

## 2010-11-11 MED ORDER — INSULIN NPH (HUMAN) (ISOPHANE) 100 UNIT/ML ~~LOC~~ SUSP
15.0000 [IU] | SUBCUTANEOUS | Status: DC
  Administered 2010-11-11 – 2010-11-13 (×5): 15 [IU] via SUBCUTANEOUS

## 2010-11-11 MED ORDER — HYDROMORPHONE HCL 2 MG PO TABS
2.0000 mg | ORAL_TABLET | ORAL | Status: DC | PRN
  Administered 2010-11-11 – 2010-11-13 (×7): 2 mg via ORAL
  Filled 2010-11-11 (×7): qty 1

## 2010-11-11 NOTE — Progress Notes (Signed)
Pt initially non-communicative, states she wants "the black nurse" who just left. Will not answer questions. Multiple comfort interventions by RN; pt starting to talk more. Insists that she can't take pills. Encouraged to try Dilaudid po in a tsp of applesauce. She was able to tolerate pill like this.

## 2010-11-11 NOTE — Progress Notes (Signed)
Spiritual Care - Received consult to see patient on Oct 22.  On first visit patient was asleep.  On Oct 23, patient spoke briefly when I introduced myself but seemed very sleepy and did not want to talk.  I visited again that afternoon with same results.  Today, 11/11/10, I visited again.  Patient responded but did not wish to talk.  She was completely covered but stated she was cold.  I asked if she would like her room temperature raised.  No response.  Discussed these reponses with RN.  Adella Hare, Chaplain

## 2010-11-11 NOTE — Progress Notes (Signed)
Hospital Day #4  S. She says her nausea is better, and she can keep some sips down and some medications down when taken orally.  Her biggest complaint now is that of "back pain", and upper abdominal pain which she attributes to vomiting.  She denies other problems.  She declines any more social work visits.   She just wants her dilaudid and ativan all the time but seems very sleepy with her head under the covers during every encounter.  O. Since the medrol taper has started her sugars are around 200 (she is getting sliding scale coverage)      I reviewed her labs and meds.      Gen: NAD      Abd: soft, upper abdominal mild TTP, no R/G      Ext : No c/c/e, NT Results for orders placed during the hospital encounter of 11/07/10 (from the past 24 hour(s))  GLUCOSE, CAPILLARY     Status: Abnormal   Collection Time   11/10/10 12:08 PM      Component Value Range   Glucose-Capillary 146 (*) 70 - 99 (mg/dL)  GLUCOSE, CAPILLARY     Status: Abnormal   Collection Time   11/10/10  5:51 PM      Component Value Range   Glucose-Capillary 186 (*) 70 - 99 (mg/dL)  GLUCOSE, CAPILLARY     Status: Abnormal   Collection Time   11/10/10 11:47 PM      Component Value Range   Glucose-Capillary 197 (*) 70 - 99 (mg/dL)   Comment 1 Notify RN    GLUCOSE, CAPILLARY     Status: Abnormal   Collection Time   11/11/10  5:57 AM      Component Value Range   Glucose-Capillary 233 (*) 70 - 99 (mg/dL)   Comment 1 Notify RN     A/P.  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 Appreciate the recommendations.                    Depression- the zoloft was restarted          Pain/Anxiety- Switched to oral formulations of dilaudid and ativan.  NO NEED FOR IV MEDS.            Will get ultrasound today to confirm  continued viability of fetus.   LOS: 4 days

## 2010-11-11 NOTE — Progress Notes (Signed)
Inpatient Diabetes Program Recommendations  ADA/AACE Glucose Target Ranges:   Fasting: 60-90 mg/dL            2 hr post-prandial: <120 mg/dL          Reason for Visit: Glucose uncontrolled during pregnancy.  Inpatient Diabetes Program Recommendations Insulin - Basal: Continue to titrate until fasting cbg's are controlled (target 60-90 mg/dL) and coordinate the am dose of NPH Correction (SSI): Diabetic Pregnant Patient Order set based on gestational age and pt wt in kg calculates total daily dose of 57 units; Correction scale is as follows: cbg of 90-120, give 2 units Novolog, 3 units for cbg of 121-160, 4 units for cbg of 161-200, 6 units for cbg of 201-240, etc. Please use to correct fasting cbg and 2 hr post-prandial. This is especially needed with Solumedrol  therapy. Insulin - Meal Coverage: Please order Novolog meal coverage of 1 unit per 8 gms carbohydrate: breakfast 30 gms, give 4 units, lunch and dinner of 45 gms, give 7 units Novolog if eating all of trays. Give correction 2 hrs post-prandial and use to calculate amended meal coverage HgbA1C: Please order to establish baseline control. Outpatient Referral: May need at discharge Diet: Added "Gestational diabetes" diet to carb modified diet orders

## 2010-11-12 DIAGNOSIS — E119 Type 2 diabetes mellitus without complications: Secondary | ICD-10-CM

## 2010-11-12 DIAGNOSIS — O10019 Pre-existing essential hypertension complicating pregnancy, unspecified trimester: Secondary | ICD-10-CM

## 2010-11-12 DIAGNOSIS — O211 Hyperemesis gravidarum with metabolic disturbance: Secondary | ICD-10-CM

## 2010-11-12 LAB — GLUCOSE, CAPILLARY
Glucose-Capillary: 154 mg/dL — ABNORMAL HIGH (ref 70–99)
Glucose-Capillary: 143 mg/dL — ABNORMAL HIGH (ref 70–99)
Glucose-Capillary: 91 mg/dL (ref 70–99)
Glucose-Capillary: 107 mg/dL — ABNORMAL HIGH (ref 70–99)

## 2010-11-12 MED ORDER — POLYETHYLENE GLYCOL 3350 17 G PO PACK
17.0000 g | PACK | Freq: Every day | ORAL | Status: DC
  Filled 2010-11-12 (×3): qty 1

## 2010-11-12 MED ORDER — M.V.I. ADULT IV INJ
INJECTION | INTRAVENOUS | Status: DC
  Filled 2010-11-12: qty 1000

## 2010-11-12 MED ORDER — PROMETHAZINE HCL 25 MG/ML IJ SOLN
25.0000 mg | INTRAMUSCULAR | Status: DC
  Administered 2010-11-12 – 2010-11-13 (×3): 25 mg via INTRAVENOUS
  Filled 2010-11-12 (×3): qty 1

## 2010-11-12 MED ORDER — ONDANSETRON 4 MG PO TBDP
4.0000 mg | ORAL_TABLET | Freq: Four times a day (QID) | ORAL | Status: DC | PRN
  Filled 2010-11-12: qty 1

## 2010-11-12 NOTE — Progress Notes (Signed)
FACULTY PRACTICE ANTEPARTUM(COMPREHENSIVE) NOTE  [name] is a 30 y.o. [phone] at [redacted]w[redacted]d by early ultrasound who is admitted for hyperemesis, Diabetes mellitus Class C,.   Fetal presentation is n/s. Length of Stay:  5  Days  Subjective: Pt remains nonverbal, and low motivation. Pt not spitting. Pt does not want to take PO pills. She did take po dilaudid in applesauce.  Pt receiving IV phenergan in Dextrose solution, will d/c dextrose.  Vitals:  Blood pressure 150/85, pulse 77, temperature 97.9 F (36.6 C), temperature source Oral, resp. rate 18, height 5\' 7"  (1.702 m), weight 81.647 kg (180 lb), last menstrual period 09/19/2010, SpO2 99.00%. Physical Examination:  General appearance - oriented to person, place, and time, overweight, chronically ill appearing  Abd: soft with hypoactive bowel sounds, nontender with out rebound or guarding.  Labs:  Recent Results (from the past 24 hour(s))  GLUCOSE, CAPILLARY   Collection Time   11/11/10 12:00 PM      Component Value Range   Glucose-Capillary 90  70 - 99 (mg/dL)  GLUCOSE, CAPILLARY   Collection Time   11/11/10  6:15 PM      Component Value Range   Glucose-Capillary 191 (*) 70 - 99 (mg/dL)   Comment 1 Documented in Chart     Comment 2 Notify RN    GLUCOSE, CAPILLARY   Collection Time   11/12/10 12:04 AM      Component Value Range   Glucose-Capillary 204 (*) 70 - 99 (mg/dL)   Comment 1 Notify RN        ASSESSMENT: Patient Active Problem List  Diagnoses  . Type 2 diabetes mellitus  . Hyperemesis gravidarum before end of [redacted] week gestation, dehydration  . Chronic hypertension in pregnancy    PLAN: DM: D/c IV glucose infusion,  replace po colace with miralax,  d/c prenatal vits and replace with IV multivits biweekly,  Add Zofran ODT4 mg q6h,   Continue Medrol  Steroid taper, currently on 16 mg tid.    Aurea Aronov V 11/12/2010,8:39 AM

## 2010-11-12 NOTE — Progress Notes (Signed)
UR Chart review completed.  

## 2010-11-13 DIAGNOSIS — O10019 Pre-existing essential hypertension complicating pregnancy, unspecified trimester: Secondary | ICD-10-CM

## 2010-11-13 DIAGNOSIS — E119 Type 2 diabetes mellitus without complications: Secondary | ICD-10-CM

## 2010-11-13 DIAGNOSIS — O211 Hyperemesis gravidarum with metabolic disturbance: Secondary | ICD-10-CM

## 2010-11-13 LAB — GLUCOSE, CAPILLARY: Glucose-Capillary: 152 mg/dL — ABNORMAL HIGH (ref 70–99)

## 2010-11-13 MED ORDER — INSULIN ASPART 100 UNIT/ML ~~LOC~~ SOLN: 5.0000 [IU] | Freq: Three times a day (TID) | SUBCUTANEOUS | Status: DC

## 2010-11-13 MED ORDER — METHYLPREDNISOLONE 8 MG PO TABS: ORAL_TABLET | ORAL | Status: DC

## 2010-11-13 MED ORDER — ESCITALOPRAM OXALATE 20 MG PO TABS: 20.0000 mg | ORAL_TABLET | Freq: Every day | ORAL | Status: DC

## 2010-11-13 MED ORDER — AMLODIPINE BESYLATE 10 MG PO TABS: 10.0000 mg | ORAL_TABLET | Freq: Every day | ORAL | Status: DC

## 2010-11-13 MED ORDER — INSULIN NPH (HUMAN) (ISOPHANE) 100 UNIT/ML ~~LOC~~ SUSP: 15.0000 [IU] | SUBCUTANEOUS | Status: DC

## 2010-11-13 MED ORDER — PROMETHAZINE HCL 12.5 MG PO TABS: 25.0000 mg | ORAL_TABLET | Freq: Four times a day (QID) | ORAL | Status: DC | PRN

## 2010-11-13 NOTE — Discharge Summary (Signed)
Physician Discharge Summary  Patient ID: [name] MRN: [id number] DOB/AGE: October 03, 1980 30 y.o.  Admit date: 11/07/2010 Discharge date: 11/13/2010  Admission Diagnoses:Pregnancy at 7 weeks Discharge Diagnoses: Same Active Problems:  Type 2 diabetes mellitus  Hyperemesis gravidarum before end of [redacted] week gestation, dehydration  Chronic hypertension in pregnancy   Discharged Condition: good  Hospital Course: Nausea and vomiting improved with hydration and medication  Consults: diabetes management  Significant Diagnostic Studies: Blood glucose monitoring  Treatments: steroids: prednisone and therapies: Hydration and antiemetics  Discharge Exam: Blood pressure 121/77, pulse 74, temperature 98.3 F (36.8 C), temperature source Oral, resp. rate 16, height 5\' 7"  (1.702 m), weight 81.988 kg (180 lb 12 oz), last menstrual period 09/19/2010, SpO2 100.00%. General appearance: alert, cooperative and no distress GI: soft, non-tender; bowel sounds normal; no masses,  no organomegaly  Disposition: Home or Self Care   Current Discharge Medication List    START taking these medications   Details  amLODipine (NORVASC) 10 MG tablet Take 1 tablet (10 mg total) by mouth daily. Qty: 30 tablet, Refills: 2    insulin NPH (HUMULIN N,NOVOLIN N) 100 UNIT/ML injection Inject 15 Units into the skin 2 (two) times daily in the am and at bedtime.Otho Darner: 10 mL, Refills: 2    methylPREDNISolone (MEDROL) 8 MG tablet 1 tablet by mouth for 3 days, then 1/2 tablet daily for 4 days Qty: 5 tablet, Refills: 0    promethazine (PHENERGAN) 12.5 MG tablet Take 2 tablets (25 mg total) by mouth every 6 (six) hours as needed for nausea. Qty: 30 tablet, Refills: 0      CONTINUE these medications which have CHANGED   Details  insulin aspart (NOVOLOG) 100 UNIT/ML injection Inject 5 Units into the skin 3 (three) times daily before meals. Qty: 10 mL, Refills: 2      CONTINUE these medications which have NOT  CHANGED   Details  albuterol (PROVENTIL HFA;VENTOLIN HFA) 108 (90 BASE) MCG/ACT inhaler Inhale 2 puffs into the lungs every 6 (six) hours as needed. For wheezing     Escitalopram Oxalate (LEXAPRO PO)  STOP taking these medications           insulin glargine (LANTUS) 100 UNIT/ML injection        Follow-up Information    Follow up with [facility] in 3 days. (high risk clinic monday)          Signed: Gearldine Looney 11/13/2010, 9:31 AM

## 2010-11-13 NOTE — Progress Notes (Signed)
Subjective: Patient reports nausea and tolerating PO.  Nausea improved  Objective: I have reviewed patient's vital signs, intake and output, medications and labs. Filed Vitals:   11/12/10 1220 11/12/10 1815 11/12/10 2138 11/13/10 0611  BP: 95/59 101/70 110/73 121/77  Pulse: 84 82 67 74  Temp: 98.5 F (36.9 C) 98.3 F (36.8 C) 98.6 F (37 C) 98.3 F (36.8 C)  TempSrc: Oral Oral Oral Oral  Resp: 16 16 16 16   Height:      Weight:    81.988 kg (180 lb 12 oz)  SpO2: 99% 100% 99% 100%    General: alert, cooperative and no distress GI: soft, non-tender; bowel sounds normal; no masses,  no organomegaly CBG (last 3)   Basename 11/13/10 0607 11/13/10 11/12/10 1813  GLUCAP 170* 152* 107*      Assessment/Plan: Hyperemesis improved [redacted]w[redacted]d D/C home F/U [facility] Cont present steroid taper, insulin, phenergan po prn  Biridiana Twardowski   LOS: 6 days    Analeia Ismael 11/13/2010, 9:08 AM

## 2010-11-16 ENCOUNTER — Emergency Department (HOSPITAL_COMMUNITY): Payer: Medicaid Other

## 2010-11-16 ENCOUNTER — Emergency Department (HOSPITAL_COMMUNITY)
Admission: EM | Admit: 2010-11-16 | Discharge: 2010-11-16 | Disposition: A | Discharge status: Home / Self Care | Payer: Medicaid Other | Attending: Emergency Medicine | Admitting: Emergency Medicine

## 2010-11-16 DIAGNOSIS — F411 Generalized anxiety disorder: Secondary | ICD-10-CM | POA: Insufficient documentation

## 2010-11-16 DIAGNOSIS — E119 Type 2 diabetes mellitus without complications: Secondary | ICD-10-CM | POA: Insufficient documentation

## 2010-11-16 DIAGNOSIS — Y92009 Unspecified place in unspecified non-institutional (private) residence as the place of occurrence of the external cause: Secondary | ICD-10-CM | POA: Insufficient documentation

## 2010-11-16 DIAGNOSIS — F3289 Other specified depressive episodes: Secondary | ICD-10-CM | POA: Insufficient documentation

## 2010-11-16 DIAGNOSIS — M129 Arthropathy, unspecified: Secondary | ICD-10-CM | POA: Insufficient documentation

## 2010-11-16 DIAGNOSIS — T6091XA Toxic effect of unspecified pesticide, accidental (unintentional), initial encounter: Secondary | ICD-10-CM | POA: Insufficient documentation

## 2010-11-16 DIAGNOSIS — F329 Major depressive disorder, single episode, unspecified: Secondary | ICD-10-CM | POA: Insufficient documentation

## 2010-11-17 ENCOUNTER — Inpatient Hospital Stay (HOSPITAL_COMMUNITY)
Admission: AD | Admit: 2010-11-17 | Discharge: 2010-11-24 | DRG: 781 | Disposition: A | Discharge status: Home / Self Care | Payer: Medicaid Other | Source: Ambulatory Visit | Attending: Obstetrics and Gynecology | Admitting: Obstetrics and Gynecology

## 2010-11-17 DIAGNOSIS — O10019 Pre-existing essential hypertension complicating pregnancy, unspecified trimester: Secondary | ICD-10-CM | POA: Diagnosis present

## 2010-11-17 DIAGNOSIS — O10919 Unspecified pre-existing hypertension complicating pregnancy, unspecified trimester: Secondary | ICD-10-CM

## 2010-11-17 DIAGNOSIS — O211 Hyperemesis gravidarum with metabolic disturbance: Secondary | ICD-10-CM

## 2010-11-17 DIAGNOSIS — O24919 Unspecified diabetes mellitus in pregnancy, unspecified trimester: Secondary | ICD-10-CM

## 2010-11-17 DIAGNOSIS — J45909 Unspecified asthma, uncomplicated: Secondary | ICD-10-CM | POA: Diagnosis present

## 2010-11-17 DIAGNOSIS — O210 Mild hyperemesis gravidarum: Principal | ICD-10-CM | POA: Diagnosis present

## 2010-11-17 DIAGNOSIS — Z3480 Encounter for supervision of other normal pregnancy, unspecified trimester: Secondary | ICD-10-CM

## 2010-11-17 DIAGNOSIS — E119 Type 2 diabetes mellitus without complications: Secondary | ICD-10-CM | POA: Diagnosis present

## 2010-11-17 DIAGNOSIS — A048 Other specified bacterial intestinal infections: Secondary | ICD-10-CM | POA: Diagnosis not present

## 2010-11-17 LAB — COMPREHENSIVE METABOLIC PANEL
ALT: 12 U/L (ref 0–35)
AST: 10 U/L (ref 0–37)
Calcium: 9.7 mg/dL (ref 8.4–10.5)
GFR calc Af Amer: 78 mL/min — ABNORMAL LOW (ref >90)
Sodium: 133 mEq/L — ABNORMAL LOW (ref 135–145)
Total Protein: 7.3 g/dL (ref 6.0–8.3)

## 2010-11-17 LAB — CBC
MCH: 25.5 pg — ABNORMAL LOW (ref 26.0–34.0)
MCHC: 33.3 g/dL (ref 30.0–36.0)
Platelets: 279 10*3/uL (ref 150–400)

## 2010-11-17 LAB — URINALYSIS, ROUTINE W REFLEX MICROSCOPIC
Glucose, UA: NEGATIVE mg/dL
Ketones, ur: 40 mg/dL — AB
Leukocytes, UA: NEGATIVE
Nitrite: NEGATIVE
Specific Gravity, Urine: 1.025 (ref 1.005–1.030)
pH: 6.5 (ref 5.0–8.0)

## 2010-11-17 LAB — RAPID URINE DRUG SCREEN, HOSP PERFORMED
Amphetamines: NOT DETECTED
Benzodiazepines: NOT DETECTED
Cocaine: NOT DETECTED
Opiates: NOT DETECTED

## 2010-11-17 LAB — GLUCOSE, CAPILLARY
Glucose-Capillary: 157 mg/dL — ABNORMAL HIGH (ref 70–99)
Glucose-Capillary: 106 mg/dL — ABNORMAL HIGH (ref 70–99)

## 2010-11-17 LAB — URINE MICROSCOPIC-ADD ON

## 2010-11-17 MED ORDER — PROMETHAZINE HCL 25 MG RE SUPP: 12.5000 mg | Freq: Four times a day (QID) | RECTAL | Status: DC | PRN

## 2010-11-17 MED ORDER — PROMETHAZINE HCL 25 MG/ML IJ SOLN
25.0000 mg | Freq: Once | INTRAVENOUS | Status: DC
  Filled 2010-11-17: qty 1

## 2010-11-17 MED ORDER — GI COCKTAIL ~~LOC~~
30.0000 mL | Freq: Once | ORAL | Status: AC
  Administered 2010-11-17: 30 mL via ORAL
  Filled 2010-11-17: qty 30

## 2010-11-17 MED ORDER — DOCUSATE SODIUM 100 MG PO CAPS
100.0000 mg | ORAL_CAPSULE | Freq: Two times a day (BID) | ORAL | Status: DC
  Administered 2010-11-19: 100 mg via ORAL
  Filled 2010-11-17 (×4): qty 1

## 2010-11-17 MED ORDER — METHYLPREDNISOLONE 4 MG PO TABS: 8.0000 mg | ORAL_TABLET | Freq: Every day | ORAL | Status: DC

## 2010-11-17 MED ORDER — METHYLPREDNISOLONE 4 MG PO TABS: 4.0000 mg | ORAL_TABLET | Freq: Every day | ORAL | Status: DC

## 2010-11-17 MED ORDER — SODIUM CHLORIDE 0.9 % IV SOLN
INTRAVENOUS | Status: DC
  Administered 2010-11-17 – 2010-11-18 (×4): via INTRAVENOUS
  Filled 2010-11-17 (×6): qty 1000

## 2010-11-17 MED ORDER — ZOLPIDEM TARTRATE 10 MG PO TABS
10.0000 mg | ORAL_TABLET | Freq: Every evening | ORAL | Status: DC | PRN
  Administered 2010-11-17 – 2010-11-23 (×4): 10 mg via ORAL
  Filled 2010-11-17 (×4): qty 1

## 2010-11-17 MED ORDER — PROMETHAZINE HCL 25 MG RE SUPP
25.0000 mg | Freq: Once | RECTAL | Status: AC
  Administered 2010-11-17: 25 mg via RECTAL
  Filled 2010-11-17: qty 1

## 2010-11-17 MED ORDER — POTASSIUM CHLORIDE IN NACL 20-0.9 MEQ/L-% IV SOLN
INTRAVENOUS | Status: DC
  Filled 2010-11-17 (×5): qty 1000

## 2010-11-17 MED ORDER — CALCIUM CARBONATE ANTACID 500 MG PO CHEW
2.0000 | CHEWABLE_TABLET | ORAL | Status: DC | PRN
  Administered 2010-11-17: 400 mg via ORAL
  Filled 2010-11-17: qty 2

## 2010-11-17 MED ORDER — METHYLPREDNISOLONE SODIUM SUCC 40 MG IJ SOLR
16.0000 mg | INTRAMUSCULAR | Status: AC
  Administered 2010-11-18 – 2010-11-19 (×5): 16 mg via INTRAVENOUS
  Administered 2010-11-19: 22:00:00 via INTRAVENOUS
  Filled 2010-11-17 (×6): qty 0.4

## 2010-11-17 MED ORDER — DOCUSATE SODIUM 100 MG PO CAPS: 100.0000 mg | ORAL_CAPSULE | Freq: Every day | ORAL | Status: DC

## 2010-11-17 MED ORDER — ONDANSETRON HCL 4 MG/2ML IJ SOLN
4.0000 mg | Freq: Four times a day (QID) | INTRAMUSCULAR | Status: DC | PRN
  Administered 2010-11-17 – 2010-11-21 (×7): 4 mg via INTRAVENOUS
  Filled 2010-11-17 (×7): qty 2

## 2010-11-17 MED ORDER — METHYLPREDNISOLONE 4 MG PO TABS
8.0000 mg | ORAL_TABLET | Freq: Every day | ORAL | Status: DC
  Administered 2010-11-20: 8 mg via ORAL
  Filled 2010-11-17 (×2): qty 2

## 2010-11-17 MED ORDER — SIMETHICONE 80 MG PO CHEW: 80.0000 mg | CHEWABLE_TABLET | Freq: Four times a day (QID) | ORAL | Status: DC | PRN

## 2010-11-17 MED ORDER — LABETALOL HCL 200 MG PO TABS
200.0000 mg | ORAL_TABLET | Freq: Once | ORAL | Status: AC
  Administered 2010-11-17: 200 mg via ORAL
  Filled 2010-11-17: qty 1

## 2010-11-17 MED ORDER — PANTOPRAZOLE SODIUM 40 MG PO TBEC
40.0000 mg | DELAYED_RELEASE_TABLET | Freq: Once | ORAL | Status: AC
  Administered 2010-11-17: 40 mg via ORAL
  Filled 2010-11-17: qty 1

## 2010-11-17 MED ORDER — ACETAMINOPHEN 325 MG PO TABS: 650.0000 mg | ORAL_TABLET | ORAL | Status: DC | PRN

## 2010-11-17 MED ORDER — LORAZEPAM 2 MG/ML IJ SOLN
1.0000 mg | Freq: Once | INTRAMUSCULAR | Status: AC
  Administered 2010-11-17: 11:00:00 via INTRAVENOUS
  Filled 2010-11-17: qty 1

## 2010-11-17 MED ORDER — ONDANSETRON HCL 4 MG/2ML IJ SOLN
4.0000 mg | Freq: Once | INTRAMUSCULAR | Status: AC
  Administered 2010-11-17: 4 mg via INTRAVENOUS
  Filled 2010-11-17: qty 2

## 2010-11-17 MED ORDER — METHYLPREDNISOLONE SODIUM SUCC 40 MG IJ SOLR
16.0000 mg | INTRAMUSCULAR | Status: AC
  Administered 2010-11-17 (×2): 16 mg via INTRAVENOUS
  Filled 2010-11-17 (×3): qty 0.4

## 2010-11-17 MED ORDER — PRENATAL PLUS 27-1 MG PO TABS
1.0000 | ORAL_TABLET | Freq: Every day | ORAL | Status: DC
  Administered 2010-11-19 – 2010-11-24 (×5): 1 via ORAL
  Filled 2010-11-17 (×5): qty 1

## 2010-11-17 MED ORDER — INSULIN ASPART 100 UNIT/ML ~~LOC~~ SOLN
0.0000 [IU] | SUBCUTANEOUS | Status: DC
  Administered 2010-11-17 – 2010-11-18 (×2): 3 [IU] via SUBCUTANEOUS
  Administered 2010-11-18: 2 [IU] via SUBCUTANEOUS
  Administered 2010-11-18: 3 [IU] via SUBCUTANEOUS
  Administered 2010-11-18: 2 [IU] via SUBCUTANEOUS
  Administered 2010-11-19 (×2): 3 [IU] via SUBCUTANEOUS
  Administered 2010-11-19: 5 [IU] via SUBCUTANEOUS
  Administered 2010-11-19: 2 [IU] via SUBCUTANEOUS
  Administered 2010-11-19: 3 [IU] via SUBCUTANEOUS
  Administered 2010-11-19 – 2010-11-20 (×3): 2 [IU] via SUBCUTANEOUS
  Administered 2010-11-20 (×2): 3 [IU] via SUBCUTANEOUS
  Administered 2010-11-20 (×2): 2 [IU] via SUBCUTANEOUS
  Administered 2010-11-21: 11 [IU] via SUBCUTANEOUS
  Administered 2010-11-21: 8 [IU] via SUBCUTANEOUS
  Administered 2010-11-21: 5 [IU] via SUBCUTANEOUS
  Administered 2010-11-21: 8 [IU] via SUBCUTANEOUS
  Administered 2010-11-21: 3 [IU] via SUBCUTANEOUS
  Administered 2010-11-21: 2 [IU] via SUBCUTANEOUS
  Administered 2010-11-22: 8 [IU] via SUBCUTANEOUS
  Administered 2010-11-22: 3 [IU] via SUBCUTANEOUS
  Filled 2010-11-17: qty 3

## 2010-11-17 MED ORDER — METHYLPREDNISOLONE 16 MG PO TABS
16.0000 mg | ORAL_TABLET | Freq: Every day | ORAL | Status: DC
  Filled 2010-11-17: qty 1

## 2010-11-17 MED ORDER — METHYLPREDNISOLONE 16 MG PO TABS
16.0000 mg | ORAL_TABLET | Freq: Every day | ORAL | Status: DC
  Administered 2010-11-20: 16 mg via ORAL
  Filled 2010-11-17 (×2): qty 1

## 2010-11-17 MED ORDER — LACTATED RINGERS IV BOLUS (SEPSIS)
1000.0000 mL | Freq: Once | INTRAVENOUS | Status: AC
  Administered 2010-11-17: 1000 mL via INTRAVENOUS

## 2010-11-17 NOTE — ED Notes (Signed)
Pt vomited additional 100cc of brownish fluid.  2nd time since my arrival.

## 2010-11-17 NOTE — Progress Notes (Signed)
INITIAL ADULT NUTRITION ASSESSMENT Date: 11/17/2010   Time: 3:23 PM Reason for Assessment: Hyperemesis  ASSESSMENT: Female 30 y.o.  Dx: <principal problem not specified> Patient Active Problem List  Diagnoses  . Type 2 diabetes mellitus  . Hyperemesis gravidarum before end of [redacted] week gestation, dehydration  . Chronic hypertension in pregnancy   Hx: 8 weeks 2 days. Recent admission for hyperemesis with discharge 10/26 Related Meds:     . docusate sodium  100 mg Oral BID  . gi cocktail  30 mL Oral Once  . insulin aspart  0-15 Units Subcutaneous Q4H  . labetalol  200 mg Oral Once  . lactated ringers  1,000 mL Intravenous Once  . LORazepam  1 mg Intravenous Once  . methylPREDNISolone  16 mg Oral Q breakfast   Followed by  . methylPREDNISolone  8 mg Oral Q breakfast   Followed by  . methylPREDNISolone  4 mg Oral Q breakfast  . methylPREDNISolone  16 mg Oral QHS   Followed by  . methylPREDNISolone  8 mg Oral QHS   Followed by  . methylPREDNISolone  4 mg Oral QHS  . methylPREDNISolone  8 mg Oral Q1400   Followed by  . methylPREDNISolone  4 mg Oral Q1400   Followed by  . methylPREDNISolone  4 mg Oral Q1400  . methylPREDNISolone (SOLU-MEDROL) injection  16 mg Intravenous Q4H   Followed by  . methylPREDNISolone (SOLU-MEDROL) injection  16 mg Intravenous [id number]  . ondansetron  4 mg Intravenous Once  . pantoprazole  40 mg Oral Once  . prenatal vitamin w/FE, FA  1 tablet Oral Daily  . promethazine  25 mg Rectal Once  . DISCONTD: docusate sodium  100 mg Oral Daily  . DISCONTD: promethazine (PHENERGAN) IV infusion  25 mg Intravenous Once    Ht: 5\' 7"  (170.2 cm)  Wt: 174 lb 8 oz (79.153 kg)  Ideal Wt: 61.6 kg 66.1 kg % Ideal Wt: 129%  Usual Wt: 181 Lbs % Usual Wt: 96%. Pt without significant weight loss ( > 5%)  Weight will likely rebound with hydration Body mass index is 27.33 kg/(m^2).  Food/Nutrition Related Hx: Did not complete steroid taper after discharge.  Nausea and vomiting returned  Labs: Bun 13, crea 1.08 Albumin 3.0 Hct 31 %  Intake:     Output:   Diet Order: Clear Liquid  Supplements/Tube Feeding:none  IVF:    0.9 % NaCl with KCl 20 mEq / L  0.9 % sodium chloride with kcl    Estimated Nutritional Needs:   Kcal:19-2100 Protein:77-87 g Fluid:2.1 L  NUTRITION DIAGNOSIS: -Inadequate oral intake (NI-2.1). R/t hyperemesis aeb pt report and dehydration Status: Ongoing  MONITORING/EVALUATION(Goals): Tol of po diet Meet estimated needs for first trimester pregnancy  EDUCATION NEEDS: -Education not appropriate at this time  INTERVENTION: Noted resumption of steroid taper C/L diet advanced to carbohydrate modified gestational, as nausea improves Pt without long enough Hx of poor PO intake to consider tube feedings, weight loss is not significant yet Pt is poor candidate for tube feedings as tube feeding would need to be continued for at least 7 days to provide repletion. At this point in time doubt if pt would be compliant to tube feedings.  Dietitian [phone]  DOCUMENTATION CODES Per approved criteria  -Not Applicable    Ethylene Reznick,KATHY 11/17/2010, 3:23 PM

## 2010-11-17 NOTE — Progress Notes (Signed)
UR Chart review completed.  

## 2010-11-17 NOTE — ED Provider Notes (Addendum)
History   [name] is a 30 y.o. year old G53P2012 female at [redacted]w[redacted]d weeks gestation who presents to [facility] reporting exacerbation of hyperemesis. She was admitted for the same complaint 11/07/10. She was Rx'd Medrol taper, phenergan, Norvasc (for CHTN), but did not fill them for financial reasons. She states she has been taking her insulin as scheduled.   CSN: [id number] Arrival date & time: 11/17/2010  3:33 AM   None     Chief Complaint  Patient presents with  . Abdominal Pain    (Consider location/radiation/quality/duration/timing/severity/associated sxs/prior treatment) HPI  Past Medical History  Diagnosis Date  . Asthma   . Diabetes mellitus   . Mental disorder   . Depression   Chronic HTN  No past surgical history on file.  No family history on file.  History  Substance Use Topics  . Smoking status: Never Smoker   . Smokeless tobacco: Not on file  . Alcohol Use: Yes    OB History    Grav Para Term Preterm Abortions TAB SAB Ect Mult Living   4 2 2  1   1  2       Review of Systems: neg for fever, chills. Pos for generalized body aches.  Allergies  Morphine and related  Home Medications  No current outpatient prescriptions on file.  BP 150/96  Pulse 88  Temp(Src) 98.2 F (36.8 C) (Oral)  Resp 20  LMP 09/19/2010 Patient Vitals for the past 24 hrs:  BP Temp Temp src Pulse Resp  11/17/10 0643 150/96 mmHg - - - -  11/17/10 0623 136/81 mmHg - - 88  -  11/17/10 0604 151/118 mmHg - - 107  -  11/17/10 0545 138/101 mmHg - - 100  -  11/17/10 0517 168/102 mmHg - - 96  20   11/17/10 0344 147/80 mmHg 98.2 F (36.8 C) Oral 116  -    Physical Exam  Constitutional: She is oriented to person, place, and time. She appears well-developed and well-nourished. She appears distressed.  Cardiovascular: Normal rate.   Pulmonary/Chest: Effort normal.  Abdominal: Soft. There is no tenderness.  Neurological: She is alert and oriented to person, place, and time.  Skin:  Skin is warm and dry.  Psychiatric: She is slowed. She is noncommunicative.       Flat affect. Not answering questions.     ED Course  Procedures (including critical care time)  Results for orders placed during the hospital encounter of 11/17/10 (from the past 24 hour(s))  GLUCOSE, CAPILLARY     Status: Abnormal   Collection Time   11/17/10  4:06 AM      Component Value Range   Glucose-Capillary 157 (*) 70 - 99 (mg/dL)  URINALYSIS, ROUTINE W REFLEX MICROSCOPIC     Status: Abnormal   Collection Time   11/17/10  4:44 AM      Component Value Range   Color, Urine YELLOW  YELLOW    Appearance CLEAR  CLEAR    Specific Gravity, Urine 1.025  1.005 - 1.030    pH 6.5  5.0 - 8.0    Glucose, UA NEGATIVE  NEGATIVE (mg/dL)   Hgb urine dipstick NEGATIVE  NEGATIVE    Bilirubin Urine NEGATIVE  NEGATIVE    Ketones, ur 40 (*) NEGATIVE (mg/dL)   Protein, ur 100 (*) NEGATIVE (mg/dL)   Urobilinogen, UA 0.2  0.0 - 1.0 (mg/dL)   Nitrite NEGATIVE  NEGATIVE    Leukocytes, UA NEGATIVE  NEGATIVE   URINE MICROSCOPIC-ADD ON  Status: Abnormal   Collection Time   11/17/10  4:44 AM      Component Value Range   Squamous Epithelial / LPF MANY (*) RARE    Bacteria, UA MANY (*) RARE    Crystals CA OXALATE CRYSTALS (*) NEGATIVE    Urine-Other AMORPHOUS URATES/PHOSPHATES      MDM  [id number]: Retching only when RN in room. Moderate response to phenergan suppository 0625: BP slightly decreased w/ Labetalol 0800: Zofran and LR.    Care assumed by Eliott Nine, PA  [place], [place] 11/17/2010 8:04 AM  I assumed care of this pt from [place], [place].  Pt has continued to have N&V despite IV hydration and antiemetics. Dr. Roselie Awkward consulted. Dr. Maricela Bo will evaluate for possible admission.  Alinda Deem. Rice III, DrHSc, MPAS, PA-C           Eliott Nine, PA 11/17/10 [address], PA 11/17/10 [phone]

## 2010-11-17 NOTE — ED Notes (Signed)
IV was initially started with the LR/phenergan bag.  When pt was asking for nausea and heartburn meds, spoke with Christiane Ha- she had ordered a plain LR, when asked her about it, she stated the pt had gotten a phenergan suppository and she did not want her to have IV phenergan also, so had changed it.   IV was changed at that time and IV zofran was ordered.

## 2010-11-17 NOTE — Progress Notes (Signed)
Pharmacy Consult:   MEDROL (METHYLPREDNISOLONE) TAPER  FOR HYPEREMESIS GRAVIDARUM PATIENTS  The following is a 14 day taper of methylprednisolone for hyperemesis. Doses on day 1 and day 2 will be given IV.  All doses starting on day 3  will be given PO. (If patient cannot tolerate oral medications, contact the pharmacy to change route to IV.)   Date Day Morning Midday Bedtime  10/30 1 16  mg 16 mg 16 mg  10/31 2 16  mg 16 mg 16 mg  11/1 3 16  mg 16 mg 16 mg  11/2 4 16  mg 8 mg 16 mg  11/3 5 16  mg 8 mg 8 mg  11/4 6 8  mg 8 mg 8 mg  11/5 7 8  mg 4 mg 8 mg  11/6 8 8  mg 4 mg 4 mg  11/7 9 8  mg 4 mg   11/8 10 8  mg 4 mg   11/9 11 8  mg    11/10 12 8  mg    11/11 13 4  mg    11/12 14 4  mg     Check fasting blood sugars daily while on the taper. Notify MD if fasting blood sugar>95.  Patient re-admitted with hyperemesis. Will restart Medrol Taper at day 1 today.  Karma Ganja [facility] 11/17/2010

## 2010-11-17 NOTE — H&P (Signed)
[name] is a 30 y.o. female presenting for return of nausea and vomiting. Patient is a 30 year old G20P2012 who presents with return of her nausea and vomiting; she was recently discharged after a long admission for hyeremesis gravidarum. She provided a history to nurse and provider earlier that she was unable to continue her outpatient steroid taper and her vomiting returned last night at 5:30 PM. She has had minimal improvement of her ssx since being in [facility]. She states she only has vomiting, and there is no blood or undigested food; only stomach acid. No diarrhea, no sick contacts. No headache, or pain anywhere (except her stomach where she has been vomiting so much). No vaginal bleeding or discharge reported.  Maternal Medical History:  Reason for admission: Reason for admission: nausea.  Prenatal complications: Hypertension.   Prenatal Complications - Diabetes: type 2.    OB History    Grav Para Term Preterm Abortions TAB SAB Ect Mult Living   4 2 2  1   1  2      Past Medical History  Diagnosis Date  . Asthma   . Diabetes mellitus   . Mental disorder   . Depression    No past surgical history on file. Family History: family history is not on file. Social History:  reports that she has never smoked. She does not have any smokeless tobacco history on file. She reports that she drinks alcohol. She reports that she uses illicit drugs (Cocaine and Marijuana).  Review of Systems  Constitutional: Positive for malaise/fatigue. Negative for fever and chills.  Eyes: Negative for blurred vision.  Respiratory: Negative for cough.   Cardiovascular: Negative for chest pain.  Gastrointestinal: Positive for nausea, vomiting and abdominal pain. Negative for diarrhea.  Genitourinary: Negative for dysuria.  Skin: Negative for rash.  Neurological: Positive for weakness. Negative for dizziness and headaches.      Blood pressure 159/84, pulse 108, temperature 97.9 F (36.6 C), temperature  source Oral, resp. rate 18, last menstrual period 09/19/2010. Exam Physical Exam  Constitutional: She appears well-developed.       Patient is under the covers in the bed, up to her head, minimal response to questioning unless prompted  HENT:  Head: Normocephalic.  Neck: Normal range of motion.  Cardiovascular: Normal rate, normal heart sounds and intact distal pulses.   Respiratory: Effort normal and breath sounds normal. No respiratory distress. She has no wheezes.  GI: Soft. Bowel sounds are normal. There is tenderness.  Neurological: She has normal reflexes.  Skin: Skin is warm and dry.    Results for orders placed during the hospital encounter of 11/17/10 (from the past 24 hour(s))  GLUCOSE, CAPILLARY     Status: Abnormal   Collection Time   11/17/10  4:06 AM      Component Value Range   Glucose-Capillary 157 (*) 70 - 99 (mg/dL)  URINALYSIS, ROUTINE W REFLEX MICROSCOPIC     Status: Abnormal   Collection Time   11/17/10  4:44 AM      Component Value Range   Color, Urine YELLOW  YELLOW    Appearance CLEAR  CLEAR    Specific Gravity, Urine 1.025  1.005 - 1.030    pH 6.5  5.0 - 8.0    Glucose, UA NEGATIVE  NEGATIVE (mg/dL)   Hgb urine dipstick NEGATIVE  NEGATIVE    Bilirubin Urine NEGATIVE  NEGATIVE    Ketones, ur 40 (*) NEGATIVE (mg/dL)   Protein, ur 100 (*) NEGATIVE (mg/dL)  Urobilinogen, UA 0.2  0.0 - 1.0 (mg/dL)   Nitrite NEGATIVE  NEGATIVE    Leukocytes, UA NEGATIVE  NEGATIVE   URINE RAPID DRUG SCREEN (HOSP PERFORMED)     Status: Abnormal   Collection Time   11/17/10  4:44 AM      Component Value Range   Opiates NONE DETECTED  NONE DETECTED    Cocaine NONE DETECTED  NONE DETECTED    Benzodiazepines NONE DETECTED  NONE DETECTED    Amphetamines NONE DETECTED  NONE DETECTED    Tetrahydrocannabinol POSITIVE (*) NONE DETECTED    Barbiturates NONE DETECTED  NONE DETECTED   URINE MICROSCOPIC-ADD ON     Status: Abnormal   Collection Time   11/17/10  4:44 AM       Component Value Range   Squamous Epithelial / LPF MANY (*) RARE    Bacteria, UA MANY (*) RARE    Crystals CA OXALATE CRYSTALS (*) NEGATIVE    Urine-Other AMORPHOUS URATES/PHOSPHATES    CBC     Status: Abnormal   Collection Time   11/17/10  8:11 AM      Component Value Range   WBC 10.3  4.0 - 10.5 (K/uL)   RBC 4.04  3.87 - 5.11 (MIL/uL)   Hemoglobin 10.3 (*) 12.0 - 15.0 (g/dL)   HCT 30.9 (*) 36.0 - 46.0 (%)   MCV 76.5 (*) 78.0 - 100.0 (fL)   MCH 25.5 (*) 26.0 - 34.0 (pg)   MCHC 33.3  30.0 - 36.0 (g/dL)   RDW 13.2  11.5 - 15.5 (%)   Platelets 279  150 - 400 (K/uL)  COMPREHENSIVE METABOLIC PANEL     Status: Abnormal   Collection Time   11/17/10  8:11 AM      Component Value Range   Sodium 133 (*) 135 - 145 (mEq/L)   Potassium 3.4 (*) 3.5 - 5.1 (mEq/L)   Chloride 98  96 - 112 (mEq/L)   CO2 23  19 - 32 (mEq/L)   Glucose, Bld 148 (*) 70 - 99 (mg/dL)   BUN 13  6 - 23 (mg/dL)   Creatinine, Ser 1.08  0.50 - 1.10 (mg/dL)   Calcium 9.7  8.4 - 10.5 (mg/dL)   Total Protein 7.3  6.0 - 8.3 (g/dL)   Albumin 3.0 (*) 3.5 - 5.2 (g/dL)   AST 10  0 - 37 (U/L)   ALT 12  0 - 35 (U/L)   Alkaline Phosphatase 63  39 - 117 (U/L)   Total Bilirubin 0.3  0.3 - 1.2 (mg/dL)   GFR calc non Af Amer 67 (*) >90 (mL/min)   GFR calc Af Amer 78 (*) >90 (mL/min)     Assessment/Plan: 1. Hyperemesis Gravidarum  A. Place NG tube  B. IV Solumedrol  C. GI Prophylaxis with IV zantac, protonix  D. Hold NPO, IV Fluids 2. Diabetes Mellitus, Prepregnancy Type 2, on insulin  A. Monitor glucoses closely and control with insulin sliding scale 3. Hypertension, Chronic  A. Continue Labetalol, and provide prn control with IV medication as needed 4. Pregnancy  A. Viability confirmed by [place] on last admission  B. Monitor for signs of early pregnancy loss and manage as appropriate  [name] N 11/17/2010, 11:09 AM

## 2010-11-17 NOTE — Progress Notes (Signed)
Patient states she is dizzy and having abdominal pain. Patient also states she can't stop vomiting and needs something for nausea.

## 2010-11-18 LAB — COMPREHENSIVE METABOLIC PANEL
AST: 10 U/L (ref 0–37)
Albumin: 2.7 g/dL — ABNORMAL LOW (ref 3.5–5.2)
Alkaline Phosphatase: 66 U/L (ref 39–117)
BUN: 13 mg/dL (ref 6–23)
CO2: 22 mEq/L (ref 19–32)
Chloride: 101 mEq/L (ref 96–112)
Potassium: 4.2 mEq/L (ref 3.5–5.1)
Total Bilirubin: 0.3 mg/dL (ref 0.3–1.2)

## 2010-11-18 LAB — GLUCOSE, CAPILLARY
Glucose-Capillary: 124 mg/dL — ABNORMAL HIGH (ref 70–99)
Glucose-Capillary: 152 mg/dL — ABNORMAL HIGH (ref 70–99)
Glucose-Capillary: 155 mg/dL — ABNORMAL HIGH (ref 70–99)
Glucose-Capillary: 137 mg/dL — ABNORMAL HIGH (ref 70–99)

## 2010-11-18 MED ORDER — PANTOPRAZOLE SODIUM 40 MG IV SOLR
40.0000 mg | Freq: Every day | INTRAVENOUS | Status: DC
  Administered 2010-11-18 – 2010-11-19 (×3): 40 mg via INTRAVENOUS
  Filled 2010-11-18 (×3): qty 40

## 2010-11-18 MED ORDER — LORAZEPAM 2 MG/ML IJ SOLN
0.5000 mg | Freq: Four times a day (QID) | INTRAMUSCULAR | Status: DC | PRN
  Administered 2010-11-18 – 2010-11-21 (×7): 0.5 mg via INTRAVENOUS
  Filled 2010-11-18 (×8): qty 1

## 2010-11-18 MED ORDER — AMLODIPINE BESYLATE 10 MG PO TABS
10.0000 mg | ORAL_TABLET | Freq: Every day | ORAL | Status: DC
  Administered 2010-11-19 – 2010-11-24 (×6): 10 mg via ORAL
  Filled 2010-11-18 (×8): qty 1

## 2010-11-18 MED ORDER — PROMETHAZINE HCL 25 MG/ML IJ SOLN
25.0000 mg | Freq: Four times a day (QID) | INTRAMUSCULAR | Status: DC | PRN
  Administered 2010-11-18 – 2010-11-21 (×6): 25 mg via INTRAVENOUS
  Filled 2010-11-18 (×6): qty 1

## 2010-11-18 MED ORDER — ESCITALOPRAM OXALATE 20 MG PO TABS
20.0000 mg | ORAL_TABLET | Freq: Every day | ORAL | Status: DC
  Administered 2010-11-18 – 2010-11-24 (×7): 20 mg via ORAL
  Filled 2010-11-18 (×8): qty 1

## 2010-11-18 MED ORDER — SODIUM CHLORIDE 0.9 % IV SOLN
25.0000 mg | INTRAVENOUS | Status: DC
  Administered 2010-11-18 – 2010-11-22 (×11): 25 mg via INTRAVENOUS
  Filled 2010-11-18 (×13): qty 1

## 2010-11-18 MED ORDER — ALBUTEROL SULFATE HFA 108 (90 BASE) MCG/ACT IN AERS
2.0000 | INHALATION_SPRAY | Freq: Four times a day (QID) | RESPIRATORY_TRACT | Status: DC | PRN
  Filled 2010-11-18: qty 6.7

## 2010-11-18 MED ORDER — FAMOTIDINE IN NACL 20-0.9 MG/50ML-% IV SOLN
20.0000 mg | Freq: Three times a day (TID) | INTRAVENOUS | Status: DC
  Administered 2010-11-18 (×2): 20 mg via INTRAVENOUS
  Filled 2010-11-18 (×3): qty 50

## 2010-11-18 MED ORDER — INSULIN ASPART 100 UNIT/ML ~~LOC~~ SOLN: 0.0000 [IU] | SUBCUTANEOUS | Status: DC

## 2010-11-18 NOTE — Progress Notes (Signed)
Subjective: Patient reports nausea.   She was able to sleep through the night with an [place] and not wake up to throw up. She is still requesting more ativan.  She is willing to talk to a psychiatrist.  Objective: I have reviewed patient's vital signs, intake and output, medications and labs.  General: distracted Resp: clear to auscultation bilaterally GI: soft, non-tender; bowel sounds normal; no masses,  no organomegaly Results for FRED, MCCANLESS (MRN [id number]) as of 11/18/2010 06:59  Ref. Range 11/18/2010 05:20  Sodium Latest Range: 135-145 mEq/L 132 (L)  Potassium Latest Range: 3.5-5.1 mEq/L PENDING  Chloride Latest Range: 96-112 mEq/L 101  CO2 Latest Range: 19-32 mEq/L 22  BUN Latest Range: 6-23 mg/dL 13  Creat Latest Range: 0.50-1.10 mg/dL 1.01  Calcium Latest Range: 8.4-10.5 mg/dL 9.7  GFR calc non Af Amer Latest Range: >90 mL/min 73 (L)  GFR calc Af Amer Latest Range: >90 mL/min 84 (L)  Glucose Latest Range: 70-99 mg/dL 128 (H)  Alkaline Phosphatase Latest Range: 39-117 U/L 66  Albumin Latest Range: 3.5-5.2 g/dL 2.7 (L)  AST Latest Range: 0-37 U/L 10  ALT Latest Range: 0-35 U/L 9  Total Protein Latest Range: 6.0-8.3 g/dL 6.9  Total Bilirubin Latest Range: 0.3-1.2 mg/dL 0.3    Assessment/Plan: 1) hyperemesis at 8 1/[redacted] weeks EGA- continue phenergan, zofran, and steroid taper. 2) depression-continue lexapro 3) reported anxiety- behavioral health consult  LOS: 1 day    Laporshia Hogen C. 11/18/2010, 7:07 AM

## 2010-11-18 NOTE — ED Provider Notes (Signed)
Agree with above note.  [name] 11/18/2010 1:21 PM

## 2010-11-18 NOTE — Progress Notes (Signed)
Patient called me to room, states she needs something " don't feel right". Unable to keep her eyes open while talking to me, unable to describe what is wrong. No c/o nausea or vomiting at this time

## 2010-11-19 LAB — COMPREHENSIVE METABOLIC PANEL
CO2: 21 mEq/L (ref 19–32)
Calcium: 9.4 mg/dL (ref 8.4–10.5)
Creatinine, Ser: 1.08 mg/dL (ref 0.50–1.10)
GFR calc Af Amer: 78 mL/min — ABNORMAL LOW (ref >90)
GFR calc non Af Amer: 67 mL/min — ABNORMAL LOW (ref >90)
Glucose, Bld: 159 mg/dL — ABNORMAL HIGH (ref 70–99)

## 2010-11-19 LAB — GLUCOSE, CAPILLARY: Glucose-Capillary: 212 mg/dL — ABNORMAL HIGH (ref 70–99)

## 2010-11-19 NOTE — Progress Notes (Signed)
Admitted      Dx Hyperemsis, [redacted]w[redacted]d, CHTN,  GDM/B, depression Subjective: Patient reports nausea persists, only drank 2 oz of grape juice this am , Additionally pt c/o continued epigastric discomfort, desiring analgesics which in past was heavily treated with Dilaudid. Will not Tx at present. Additionally, c/o diffuse anxiety, but cannot articulate why.   Soc Hx Partner present, supportive, desires pregnancy. Both wish to continue c pregnancy.  Objective: I have reviewed patient's vital signs, intake and output, medications and labs. CBG's consistently in 130-150 on q4h regimen, received 9 units SSI yesterday in 4 divided doses.BP controlled   General: alert, appears older than stated age, distracted and slowed mentation GI: normal findings: soft, non-tender and abnormal findings:  hypoactive bowel sounds   Assessment/Plan: Hyperemesis  Gravidarum 8+5wk, poor p.o. Intake, minimal symptomatic improvement on Medrol Taper/[id number].  Will need reconsideration of Feeding tube soon Esphageal discomfort, likely reflux, on 2 i.v. Agents Diffuse anxiety/depression, await Psychiatry guidance re: 2nd agent CHTN- stable DM-B , well controlled on SSI q 4h, will convert to bid NPH plus q meal novolog when taking PO  LOS: 2 days    [name] V 11/19/2010, 9:03 AM

## 2010-11-19 NOTE — Progress Notes (Signed)
Noted Pts. minimal  PO intake, and subsequent re-consideration of tube feedings.  If tube feedings desired, Recommend: Place  # 8 french feeding tube ng Initate Jevity 1.2 at 20 ml/hr CNG Advance by 10 ml q 6 hours as tolerated to goal rate of 65 ml/hr Decrease IVF for every increase of tube feeding BMP and phosphorus level 24 hours after initiation of tube feeding Tube feedings should continue for at least 7 days to provide repletion  Above goal tube feeding rate  will provide 1872 Kcal, 86 g protein and 1263 ml free water If IVF turned off and tube feedings continued, will require free water bolus of 160 ml q 6 hours

## 2010-11-20 ENCOUNTER — Inpatient Hospital Stay (HOSPITAL_COMMUNITY): Payer: Medicaid Other

## 2010-11-20 DIAGNOSIS — J45909 Unspecified asthma, uncomplicated: Secondary | ICD-10-CM | POA: Diagnosis present

## 2010-11-20 DIAGNOSIS — F329 Major depressive disorder, single episode, unspecified: Secondary | ICD-10-CM | POA: Insufficient documentation

## 2010-11-20 LAB — COMPREHENSIVE METABOLIC PANEL
ALT: 12 U/L (ref 0–35)
Alkaline Phosphatase: 60 U/L (ref 39–117)
CO2: 22 mEq/L (ref 19–32)
GFR calc Af Amer: 80 mL/min — ABNORMAL LOW (ref >90)
GFR calc non Af Amer: 69 mL/min — ABNORMAL LOW (ref >90)
Glucose, Bld: 171 mg/dL — ABNORMAL HIGH (ref 70–99)
Potassium: 3.8 mEq/L (ref 3.5–5.1)
Sodium: 129 mEq/L — ABNORMAL LOW (ref 135–145)
Total Protein: 7.1 g/dL (ref 6.0–8.3)

## 2010-11-20 LAB — GLUCOSE, CAPILLARY
Glucose-Capillary: 188 mg/dL — ABNORMAL HIGH (ref 70–99)
Glucose-Capillary: 143 mg/dL — ABNORMAL HIGH (ref 70–99)
Glucose-Capillary: 140 mg/dL — ABNORMAL HIGH (ref 70–99)
Glucose-Capillary: 146 mg/dL — ABNORMAL HIGH (ref 70–99)
Glucose-Capillary: 130 mg/dL — ABNORMAL HIGH (ref 70–99)

## 2010-11-20 MED ORDER — CLARITHROMYCIN 250 MG/5ML PO SUSR
500.0000 mg | Freq: Two times a day (BID) | ORAL | Status: DC
  Administered 2010-11-20 – 2010-11-23 (×6): 500 mg
  Filled 2010-11-20 (×8): qty 10

## 2010-11-20 MED ORDER — METHYLPREDNISOLONE SODIUM SUCC 40 MG IJ SOLR
16.0000 mg | Freq: Every day | INTRAMUSCULAR | Status: AC
  Administered 2010-11-21: 16 mg via INTRAVENOUS
  Filled 2010-11-20: qty 0.4

## 2010-11-20 MED ORDER — METHYLPREDNISOLONE SODIUM SUCC 40 MG IJ SOLR
4.0000 mg | Freq: Every day | INTRAMUSCULAR | Status: DC
  Filled 2010-11-20: qty 0.1

## 2010-11-20 MED ORDER — ACETAMINOPHEN 160 MG/5ML PO SOLN
650.0000 mg | ORAL | Status: DC | PRN
  Filled 2010-11-20: qty 20.3

## 2010-11-20 MED ORDER — JEVITY 1.2 CAL PO LIQD
1000.0000 mL | ORAL | Status: AC
  Administered 2010-11-21: 1000 mL

## 2010-11-20 MED ORDER — PANTOPRAZOLE SODIUM 40 MG PO TBEC
40.0000 mg | DELAYED_RELEASE_TABLET | Freq: Every day | ORAL | Status: DC
  Filled 2010-11-20: qty 1

## 2010-11-20 MED ORDER — AMOXICILLIN 250 MG/5ML PO SUSR
1000.0000 mg | Freq: Two times a day (BID) | ORAL | Status: DC
  Administered 2010-11-20 – 2010-11-21 (×2): 1000 mg
  Administered 2010-11-21 (×2): 500 mg
  Administered 2010-11-22 – 2010-11-23 (×3): 1000 mg
  Filled 2010-11-20 (×8): qty 20

## 2010-11-20 MED ORDER — METHYLPREDNISOLONE SODIUM SUCC 40 MG IJ SOLR
8.0000 mg | Freq: Every day | INTRAMUSCULAR | Status: DC
  Administered 2010-11-21: 8 mg via INTRAVENOUS
  Filled 2010-11-20 (×2): qty 0.2

## 2010-11-20 MED ORDER — METHYLPREDNISOLONE SODIUM SUCC 40 MG IJ SOLR
16.0000 mg | Freq: Every day | INTRAMUSCULAR | Status: DC
  Administered 2010-11-20: 16 mg via INTRAVENOUS
  Filled 2010-11-20: qty 0.4

## 2010-11-20 MED ORDER — JEVITY 1.2 CAL PO LIQD
1000.0000 mL | ORAL | Status: AC
  Administered 2010-11-21: 1000 mL
  Filled 2010-11-20: qty 1000

## 2010-11-20 MED ORDER — DOCUSATE SODIUM 50 MG/5ML PO LIQD
100.0000 mg | Freq: Two times a day (BID) | ORAL | Status: DC
  Administered 2010-11-20 – 2010-11-21 (×2): 100 mg via ORAL
  Administered 2010-11-21: 50 mg via ORAL
  Administered 2010-11-22 – 2010-11-23 (×3): 100 mg via ORAL
  Filled 2010-11-20 (×8): qty 10

## 2010-11-20 MED ORDER — METHYLPREDNISOLONE SODIUM SUCC 40 MG IJ SOLR
8.0000 mg | Freq: Every day | INTRAMUSCULAR | Status: DC
  Administered 2010-11-22: 8 mg via INTRAVENOUS
  Filled 2010-11-20 (×2): qty 0.2

## 2010-11-20 MED ORDER — JEVITY 1.2 CAL PO LIQD
1000.0000 mL | ORAL | Status: AC
  Administered 2010-11-20: 1000 mL
  Filled 2010-11-20: qty 1000

## 2010-11-20 MED ORDER — PANTOPRAZOLE SODIUM 40 MG IV SOLR
40.0000 mg | Freq: Every day | INTRAVENOUS | Status: DC
  Administered 2010-11-20 – 2010-11-21 (×2): 40 mg via INTRAVENOUS
  Filled 2010-11-20 (×2): qty 40

## 2010-11-20 NOTE — Progress Notes (Addendum)
See Care Management Focus note in shadow chart. Fax # for [facility] is [phone].  UR Chart review completed.

## 2010-11-20 NOTE — Progress Notes (Signed)
NG feeding tube placed in right nare by T. Corbitt, Therapist, sports.  Tube advanced to 65cm without resistance.  Dr. Hulan Fray aware of placement and states she will order chest XRay to confirm placement in proximal jejunum.

## 2010-11-20 NOTE — Progress Notes (Signed)
As per Dr. Glo Herring 21fr feeding tube pulled back approx. 15cm and is now clamped at her R nare at 60cm. Dr. Shirlee Latch does not want a repeat CXR at this time but want guidewire removed and placement checked with air bolus. Guidewire removed without any difficulty or resistance and a positive air bolus heard

## 2010-11-20 NOTE — Progress Notes (Signed)
Assessment Team member from behavorial health in to see patient. See notes in patient's chart. Suggested that OB doctor get in touch with psychiatrist to discuss medications.

## 2010-11-20 NOTE — Progress Notes (Signed)
NG tube pulled back 23 cm, and then advanced 15 cm per radiologists instructions. Patient to radiology for chest x ray to check placement.

## 2010-11-20 NOTE — Progress Notes (Signed)
Subjective: Patient reports nausea and vomiting (once).    Objective: I have reviewed patient's vital signs, intake and output, medications and labs.  General: lethargic Resp: clear to auscultation bilaterally Cardio: regular rate and rhythm, S1, S2 normal, no murmur, click, rub or gallop GI: soft, non-tender; bowel sounds normal; no masses,  no organomegaly   Assessment/Plan: Hyperemesis-no improvement currently.  She does agree to a feeding tube today.  LOS: 3 days    [name]. 11/20/2010, 9:28 AM

## 2010-11-20 NOTE — Progress Notes (Signed)
The patient is receiving Protonix by the intravenous route.  Based on criteria approved by the Pharmacy and [facility], the medication is being converted to the equivalent oral dose form.  These criteria include: -No Active GI bleeding -Able to tolerate diet of full liquids (or better) or tube feeding -Able to tolerate other medications by the oral or enteral route  If you have any questions about this conversion, please contact the Pharmacy Department (ext [phone]).  Thank you.  Valeda Malm.11/20/2010.

## 2010-11-21 LAB — GLUCOSE, CAPILLARY
Glucose-Capillary: 167 mg/dL — ABNORMAL HIGH (ref 70–99)
Glucose-Capillary: 182 mg/dL — ABNORMAL HIGH (ref 70–99)
Glucose-Capillary: 349 mg/dL — ABNORMAL HIGH (ref 70–99)
Glucose-Capillary: 297 mg/dL — ABNORMAL HIGH (ref 70–99)

## 2010-11-21 LAB — COMPREHENSIVE METABOLIC PANEL
AST: 18 U/L (ref 0–37)
BUN: 13 mg/dL (ref 6–23)
CO2: 22 mEq/L (ref 19–32)
Calcium: 9 mg/dL (ref 8.4–10.5)
Creatinine, Ser: 1.06 mg/dL (ref 0.50–1.10)
GFR calc Af Amer: 80 mL/min — ABNORMAL LOW (ref >90)
GFR calc non Af Amer: 69 mL/min — ABNORMAL LOW (ref >90)

## 2010-11-21 MED ORDER — INSULIN NPH (HUMAN) (ISOPHANE) 100 UNIT/ML ~~LOC~~ SUSP
11.0000 [IU] | Freq: Two times a day (BID) | SUBCUTANEOUS | Status: DC
  Administered 2010-11-21 – 2010-11-22 (×4): 11 [IU] via SUBCUTANEOUS
  Filled 2010-11-21: qty 10

## 2010-11-21 NOTE — Progress Notes (Signed)
FACULTY PRACTICE ANTEPARTUM(COMPREHENSIVE) NOTE  [name] is a 30 y.o. [phone] at [redacted]w[redacted]d by early ultrasound who is admitted for hyperemesis, diabetes mellitus, chronic htn.   Fetal presentation is n/a. Length of Stay:  4  Days  Subjective: Pt has tolerated tube feedings since placement of Feeding tube last nite,currently on Jevity 1.2  At 30 cc/hr. She has not vomited,but did require i.v phenergan x 1. cbg's have increased to 200's so we will initiate baseline NPH 11/11 and continue q4h SSI.   Vitals:  Blood pressure 168/92, pulse 110, temperature 98.3 F (36.8 C), temperature source Oral, resp. rate 16, height 5\' 7"  (1.702 m), weight 81.194 kg (179 lb), last menstrual period 09/19/2010, SpO2 98.00%. Physical Examination:  General appearance - alert, well appearing, and in no distress, anxious and sleeping well Abdomen - soft, nontender, nondistended, no masses or organomegaly   Labs:  Recent Results (from the past 24 hour(s))  GLUCOSE, CAPILLARY   Collection Time   11/20/10 10:15 AM      Component Value Range   Glucose-Capillary 143 (*) 70 - 99 (mg/dL)  GLUCOSE, CAPILLARY   Collection Time   11/20/10  2:18 PM      Component Value Range   Glucose-Capillary 140 (*) 70 - 99 (mg/dL)  GLUCOSE, CAPILLARY   Collection Time   11/20/10  3:03 PM      Component Value Range   Glucose-Capillary 146 (*) 70 - 99 (mg/dL)  GLUCOSE, CAPILLARY   Collection Time   11/20/10  5:58 PM      Component Value Range   Glucose-Capillary 130 (*) 70 - 99 (mg/dL)   Comment 1 Notify RN    GLUCOSE, CAPILLARY   Collection Time   11/20/10  6:35 PM      Component Value Range   Glucose-Capillary 133 (*) 70 - 99 (mg/dL)  GLUCOSE, CAPILLARY   Collection Time   11/20/10 10:15 PM      Component Value Range   Glucose-Capillary 143 (*) 70 - 99 (mg/dL)   Comment 1 Notify RN    GLUCOSE, CAPILLARY   Collection Time   11/21/10  1:11 AM      Component Value Range   Glucose-Capillary 167 (*) 70 - 99 (mg/dL)    GLUCOSE, CAPILLARY   Collection Time   11/21/10  2:24 AM      Component Value Range   Glucose-Capillary 182 (*) 70 - 99 (mg/dL)  COMPREHENSIVE METABOLIC PANEL   Collection Time   11/21/10  5:50 AM      Component Value Range   Sodium 130 (*) 135 - 145 (mEq/L)   Potassium 4.0  3.5 - 5.1 (mEq/L)   Chloride 101  96 - 112 (mEq/L)   CO2 22  19 - 32 (mEq/L)   Glucose, Bld 210 (*) 70 - 99 (mg/dL)   BUN 13  6 - 23 (mg/dL)   Creatinine, Ser 1.06  0.50 - 1.10 (mg/dL)   Calcium 9.0  8.4 - 10.5 (mg/dL)   Total Protein 6.6  6.0 - 8.3 (g/dL)   Albumin 2.8 (*) 3.5 - 5.2 (g/dL)   AST 18  0 - 37 (U/L)   ALT 18  0 - 35 (U/L)   Alkaline Phosphatase 59  39 - 117 (U/L)   Total Bilirubin 0.4  0.3 - 1.2 (mg/dL)   GFR calc non Af Amer 69 (*) >90 (mL/min)   GFR calc Af Amer 80 (*) >90 (mL/min)  GLUCOSE, CAPILLARY   Collection Time   11/21/10  5:53 AM      Component Value Range   Glucose-Capillary 230 (*) 70 - 99 (mg/dL)   Comment 1 Notify RN      Medications:  Scheduled    . amLODipine  10 mg Oral Daily  . amoxicillin  1,000 mg Per Tube Q12H  . clarithromycin  500 mg Per Tube Q12H  . docusate  100 mg Oral BID  . escitalopram  20 mg Oral Daily  . insulin aspart  0-15 Units Subcutaneous Q4H  . insulin NPH  11 Units Subcutaneous BID  . methylPREDNISolone (SOLU-MEDROL) injection  16 mg Intravenous QAC breakfast  . methylPREDNISolone (SOLU-MEDROL) injection  4 mg Intravenous Q1400  . methylPREDNISolone (SOLU-MEDROL) injection  8 mg Intravenous Q breakfast  . methylPREDNISolone (SOLU-MEDROL) injection  8 mg Intravenous Q1400  . methylPREDNISolone (SOLU-MEDROL) injection  8 mg Intravenous QHS  . pantoprazole (PROTONIX) IV  40 mg Intravenous QHS  . prenatal vitamin w/FE, FA  1 tablet Oral Daily  . DISCONTD: docusate sodium  100 mg Oral BID  . DISCONTD: methylPREDNISolone  16 mg Oral Q breakfast  . DISCONTD: methylPREDNISolone  16 mg Oral QHS  . DISCONTD: methylPREDNISolone  4 mg Oral Q breakfast   . DISCONTD: methylPREDNISolone  4 mg Oral Q1400  . DISCONTD: methylPREDNISolone  4 mg Oral Q1400  . DISCONTD: methylPREDNISolone  4 mg Oral QHS  . DISCONTD: methylPREDNISolone  8 mg Oral Q breakfast  . DISCONTD: methylPREDNISolone  8 mg Oral Q1400  . DISCONTD: methylPREDNISolone  8 mg Oral QHS  . DISCONTD: methylPREDNISolone (SOLU-MEDROL) injection  16 mg Intravenous QHS  . DISCONTD: pantoprazole  40 mg Oral QHS  . DISCONTD: pantoprazole (PROTONIX) IV  40 mg Intravenous QHS    ASSESSMENT: Patient Active Problem List  Diagnoses  . Type 2 diabetes mellitus  . Hyperemesis gravidarum before end of [redacted] week gestation, dehydration  . Chronic hypertension in pregnancy  . Depression  . Asthma    PLAN: Advance tube feedings today, goal Jevity at 65 cc.hr, and add baseline NPH 11/11 this a.m. Continue SSI q 4h May require 2nd bp med if bp's stay high p am dosing  Javad Salva V 11/21/2010,7:17 AM

## 2010-11-22 LAB — COMPREHENSIVE METABOLIC PANEL
Alkaline Phosphatase: 53 U/L (ref 39–117)
BUN: 15 mg/dL (ref 6–23)
Chloride: 102 mEq/L (ref 96–112)
Creatinine, Ser: 1.01 mg/dL (ref 0.50–1.10)
GFR calc Af Amer: 84 mL/min — ABNORMAL LOW (ref >90)
Glucose, Bld: 272 mg/dL — ABNORMAL HIGH (ref 70–99)
Potassium: 4.2 mEq/L (ref 3.5–5.1)
Total Bilirubin: 0.2 mg/dL — ABNORMAL LOW (ref 0.3–1.2)

## 2010-11-22 LAB — GLUCOSE, CAPILLARY: Glucose-Capillary: 114 mg/dL — ABNORMAL HIGH (ref 70–99)

## 2010-11-22 MED ORDER — METHYLPREDNISOLONE 4 MG PO TABS
4.0000 mg | ORAL_TABLET | Freq: Every day | ORAL | Status: DC
  Administered 2010-11-23: 4 mg via ORAL
  Filled 2010-11-22 (×2): qty 1

## 2010-11-22 MED ORDER — INSULIN ASPART 100 UNIT/ML ~~LOC~~ SOLN
0.0000 [IU] | Freq: Three times a day (TID) | SUBCUTANEOUS | Status: DC
  Administered 2010-11-22: 8 [IU] via SUBCUTANEOUS
  Administered 2010-11-22: 5 [IU] via SUBCUTANEOUS
  Administered 2010-11-23: 3 [IU] via SUBCUTANEOUS
  Administered 2010-11-23: 5 [IU] via SUBCUTANEOUS
  Administered 2010-11-23: 3 [IU] via SUBCUTANEOUS
  Administered 2010-11-24 (×2): 2 [IU] via SUBCUTANEOUS

## 2010-11-22 MED ORDER — JEVITY 1.2 CAL PO LIQD
1000.0000 mL | ORAL | Status: DC
  Administered 2010-11-22 – 2010-11-23 (×2): 1000 mL via ORAL
  Filled 2010-11-22 (×3): qty 1000

## 2010-11-22 MED ORDER — SODIUM CHLORIDE 0.9 % IV SOLN
INTRAVENOUS | Status: DC
  Administered 2010-11-22: 09:00:00 via INTRAVENOUS

## 2010-11-22 MED ORDER — METHYLPREDNISOLONE 4 MG PO TABS
8.0000 mg | ORAL_TABLET | Freq: Once | ORAL | Status: AC
  Administered 2010-11-23: 8 mg via ORAL
  Filled 2010-11-22: qty 2

## 2010-11-22 MED ORDER — METHYLPREDNISOLONE 4 MG PO TABS
4.0000 mg | ORAL_TABLET | Freq: Once | ORAL | Status: DC
  Filled 2010-11-22: qty 1

## 2010-11-22 MED ORDER — METHYLPREDNISOLONE 4 MG PO TABS
8.0000 mg | ORAL_TABLET | Freq: Once | ORAL | Status: AC
  Administered 2010-11-22: 8 mg via ORAL
  Filled 2010-11-22: qty 2

## 2010-11-22 MED ORDER — METHYLPREDNISOLONE 4 MG PO TABS: 4.0000 mg | ORAL_TABLET | Freq: Every day | ORAL | Status: DC

## 2010-11-22 MED ORDER — PANTOPRAZOLE SODIUM 40 MG PO TBEC
40.0000 mg | DELAYED_RELEASE_TABLET | Freq: Every day | ORAL | Status: DC
  Administered 2010-11-22 – 2010-11-23 (×2): 40 mg via ORAL
  Filled 2010-11-22 (×4): qty 1

## 2010-11-22 MED ORDER — INSULIN ASPART 100 UNIT/ML ~~LOC~~ SOLN
15.0000 [IU] | Freq: Three times a day (TID) | SUBCUTANEOUS | Status: DC
  Administered 2010-11-22 – 2010-11-24 (×6): 15 [IU] via SUBCUTANEOUS

## 2010-11-22 MED ORDER — METHYLPREDNISOLONE 4 MG PO TABS
8.0000 mg | ORAL_TABLET | Freq: Every day | ORAL | Status: DC
  Administered 2010-11-23 – 2010-11-24 (×2): 8 mg via ORAL
  Filled 2010-11-22 (×3): qty 2

## 2010-11-22 NOTE — Progress Notes (Signed)
Subjective: Patient reports feeling hungry and wanting to eat.  Objective: I have reviewed patient's vital signs, intake and output, medications, labs and weight.  General: alert, cooperative and appears stated age GI: soft, non-tender; bowel sounds normal; no masses,  no organomegaly   Assessment/Plan: Hyperemesis, much improved on Tube Feeds at full strength 65 cc/hr.  Still with weight loss yesterday. BS are too high. Will advance diet today.  Change to po meds.  Increase insulin.  Possibly home tomorrow with home health.  LOS: 5 days    [name] S 11/22/2010, 7:58 AM

## 2010-11-23 DIAGNOSIS — O10019 Pre-existing essential hypertension complicating pregnancy, unspecified trimester: Secondary | ICD-10-CM

## 2010-11-23 DIAGNOSIS — E119 Type 2 diabetes mellitus without complications: Secondary | ICD-10-CM

## 2010-11-23 DIAGNOSIS — O211 Hyperemesis gravidarum with metabolic disturbance: Secondary | ICD-10-CM

## 2010-11-23 LAB — GLUCOSE, CAPILLARY
Glucose-Capillary: 267 mg/dL — ABNORMAL HIGH (ref 70–99)
Glucose-Capillary: 182 mg/dL — ABNORMAL HIGH (ref 70–99)

## 2010-11-23 LAB — COMPREHENSIVE METABOLIC PANEL
AST: 6 U/L (ref 0–37)
Albumin: 2.6 g/dL — ABNORMAL LOW (ref 3.5–5.2)
BUN: 20 mg/dL (ref 6–23)
Calcium: 9.5 mg/dL (ref 8.4–10.5)
Chloride: 100 mEq/L (ref 96–112)
Creatinine, Ser: 0.93 mg/dL (ref 0.50–1.10)
GFR calc non Af Amer: 80 mL/min — ABNORMAL LOW (ref >90)
Total Bilirubin: 0.1 mg/dL — ABNORMAL LOW (ref 0.3–1.2)

## 2010-11-23 MED ORDER — AMOXICILLIN 500 MG PO CAPS
1000.0000 mg | ORAL_CAPSULE | Freq: Two times a day (BID) | ORAL | Status: DC
  Administered 2010-11-23 – 2010-11-24 (×2): 1000 mg via ORAL
  Filled 2010-11-23 (×4): qty 2

## 2010-11-23 MED ORDER — CLARITHROMYCIN 500 MG PO TABS
500.0000 mg | ORAL_TABLET | Freq: Two times a day (BID) | ORAL | Status: DC
  Administered 2010-11-23 – 2010-11-24 (×2): 500 mg via ORAL
  Filled 2010-11-23 (×4): qty 1

## 2010-11-23 MED ORDER — DOCUSATE SODIUM 100 MG PO CAPS
100.0000 mg | ORAL_CAPSULE | Freq: Two times a day (BID) | ORAL | Status: DC
  Administered 2010-11-23 – 2010-11-24 (×2): 100 mg via ORAL
  Filled 2010-11-23 (×2): qty 1

## 2010-11-23 MED ORDER — INSULIN NPH (HUMAN) (ISOPHANE) 100 UNIT/ML ~~LOC~~ SUSP
15.0000 [IU] | Freq: Two times a day (BID) | SUBCUTANEOUS | Status: DC
  Administered 2010-11-23 – 2010-11-24 (×3): 15 [IU] via SUBCUTANEOUS

## 2010-11-23 NOTE — Progress Notes (Signed)
UR chart review completed.  

## 2010-11-23 NOTE — Progress Notes (Addendum)
Subjective: Hospital day #6.  Patient has NGT with tube feeds.  Has been eating over the past 48 hours and has not had any antiemetics.  Patient reports no nausea, vomiting, abdominal pain.  Has no other complaint.  Objective: I have reviewed patient's vital signs.  BP 122/81  Pulse 89  Temp(Src) 97.9 F (36.6 C) (Oral)  Resp 18  Ht 5\' 7"  (1.702 m)  Wt 80.287 kg (177 lb)  BMI 27.72 kg/m2  SpO2 100%  LMP 09/19/2010  General: alert, cooperative and no distress Resp: clear to auscultation bilaterally Cardio: regular rate and rhythm, S1, S2 normal, no murmur, click, rub or gallop GI: soft, non-tender; bowel sounds normal; no masses,  no organomegaly Extremities: extremities normal, atraumatic, no cyanosis or edema   Assessment/Plan: #1  [id number] with IUP at 9weeks 2 days. #2  Hyperemesis gravidum  - As has been tolerating PO, would not qualify for home care with NGT.  Will stop tube feeds.  If tolerates PO without nausea and/or vomiting, will D/C NGT.  If tolerating PO without nausea/vomiting after that, will d/c to home in AM.  #3  H. Pylori   - Continue amoxicillin, clarithromycin, and PPI. #4  DM2 - fasting, postprandial CBGs in 200 - 250s.  Will need escalating doses of insulin.    - Increase NPH to 15 units BID   - Continue with Novolog 15 units with meals.  - Continue CBG fasting and 2hr Postprandial. #5  Chronic HTN - controlled.  - Continue amlodipine.   LOS: 6 days    STINSON, JACOB JEHIEL 11/23/2010, 9:20 AM

## 2010-11-24 DIAGNOSIS — A048 Other specified bacterial intestinal infections: Secondary | ICD-10-CM | POA: Diagnosis not present

## 2010-11-24 LAB — COMPREHENSIVE METABOLIC PANEL
Albumin: 2.7 g/dL — ABNORMAL LOW (ref 3.5–5.2)
BUN: 22 mg/dL (ref 6–23)
Calcium: 9.8 mg/dL (ref 8.4–10.5)
GFR calc Af Amer: 83 mL/min — ABNORMAL LOW (ref >90)
Glucose, Bld: 144 mg/dL — ABNORMAL HIGH (ref 70–99)
Sodium: 130 mEq/L — ABNORMAL LOW (ref 135–145)
Total Protein: 6.7 g/dL (ref 6.0–8.3)

## 2010-11-24 LAB — GLUCOSE, CAPILLARY
Glucose-Capillary: 143 mg/dL — ABNORMAL HIGH (ref 70–99)
Glucose-Capillary: 126 mg/dL — ABNORMAL HIGH (ref 70–99)
Glucose-Capillary: 102 mg/dL — ABNORMAL HIGH (ref 70–99)

## 2010-11-24 MED ORDER — DSS 100 MG PO CAPS: 100.0000 mg | ORAL_CAPSULE | Freq: Two times a day (BID) | ORAL | Status: DC

## 2010-11-24 MED ORDER — PRENATAL PLUS 27-1 MG PO TABS: 1.0000 | ORAL_TABLET | Freq: Every day | ORAL | Status: DC

## 2010-11-24 MED ORDER — METHYLPREDNISOLONE 8 MG PO TABS: ORAL_TABLET | ORAL | Status: DC

## 2010-11-24 MED ORDER — INSULIN ASPART 100 UNIT/ML ~~LOC~~ SOLN: 15.0000 [IU] | Freq: Three times a day (TID) | SUBCUTANEOUS | Status: DC

## 2010-11-24 MED ORDER — PANTOPRAZOLE SODIUM 40 MG PO TBEC: 40.0000 mg | DELAYED_RELEASE_TABLET | Freq: Every day | ORAL | Status: DC

## 2010-11-24 MED ORDER — AMOXICILLIN 500 MG PO CAPS: 1000.0000 mg | ORAL_CAPSULE | Freq: Two times a day (BID) | ORAL | Status: AC

## 2010-11-24 MED ORDER — CLARITHROMYCIN 500 MG PO TABS: 500.0000 mg | ORAL_TABLET | Freq: Two times a day (BID) | ORAL | Status: AC

## 2010-11-24 NOTE — Progress Notes (Signed)
Pt discharged to home with significant other. Condition stable.   Pt ambulated to car with NT.  No equipment ordered for home at discharge.

## 2010-11-24 NOTE — Discharge Summary (Signed)
Physician Discharge Summary  Patient ID: [name] MRN: [id number] DOB/AGE: [birth year] 30 y.o.  Admit date: 11/17/2010 Discharge date: 11/24/2010  Admission Diagnoses: #1 intrauterine pregnancy at 9 weeks and 3 days #2 hyperemesis gravidarum  #3 diabetes type 2 #4 chronic hypertension  Discharge Diagnoses:  Active Problems:  Type 2 diabetes mellitus  Hyperemesis gravidarum before end of [redacted] week gestation, dehydration  Chronic hypertension in pregnancy  Asthma  H. pylori infection   Discharged Condition: good  Hospital Course: This is a 30 year old G4 P2 012 with intrauterine pregnancy at 9 weeks and 3 days on discharge who was readmitted on 10/30 due to hyperemesis gravidarum. The patient was continued on her direct taper and was given antiemetics, as well as a proton pump inhibitor. With continued difficulty maintaining intake by mouth, the nasogastric tube was placed on 11/2 with good relief. Patient had like to proceed started the next day. The patient started taking food and liquid by mouth, which continued for 2 days. The tube feeds were stopped and the NG tube was pulled after the patient tolerated oral intake. Patient was observed for 12 hours and was discharged tolerating oral intake. The patient was diagnosed with an H. pylori infection on 11/2, and the patient was started on amoxicillin and clarithromycin for antibiotic coverage for this infection.  Consults: none  Significant Diagnostic Studies: microbiology: positive h. Pylori  Treatments: IV hydration, antibiotics: amoxicillin and clarithromycin, steroids: methylprednisolone and insulin: Humalog and NPH  Discharge Exam: Blood pressure 108/72, pulse 93, temperature 98.4 F (36.9 C), temperature source Oral, resp. rate 19, height 5\' 7"  (1.702 m), weight 80.74 kg (178 lb), last menstrual period 09/19/2010, SpO2 99.00%. General appearance: alert, cooperative and no distress Resp: clear to auscultation  bilaterally Cardio: regular rate and rhythm, S1, S2 normal, no murmur, click, rub or gallop GI: soft, non-tender; bowel sounds normal; no masses,  no organomegaly Extremities: extremities normal, atraumatic, no cyanosis or edema Pulses: 2+ and symmetric  Disposition: Home or Self Care  Discharge Orders    Future Appointments: Provider: Department: Dept Phone: Center:   12/08/2010 8:30 AM [facility] Nurse [facility] [phone] [facility]     Current Discharge Medication List    START taking these medications   Details  amoxicillin (AMOXIL) 500 MG capsule Take 2 capsules (1,000 mg total) by mouth every 12 (twelve) hours. Qty: 14 capsule, Refills: 0    clarithromycin (BIAXIN) 500 MG tablet Take 1 tablet (500 mg total) by mouth every 12 (twelve) hours. Qty: 14 tablet, Refills: 0    docusate sodium 100 MG CAPS Take 100 mg by mouth 2 (two) times daily. Qty: 30 capsule, Refills: 0    pantoprazole (PROTONIX) 40 MG tablet Take 1 tablet (40 mg total) by mouth daily at 12 noon. Qty: 60 tablet, Refills: 0    prenatal vitamin w/FE, FA (PRENATAL 1 + 1) 27-1 MG TABS Take 1 tablet by mouth daily. Qty: 30 each, Refills: 0      CONTINUE these medications which have CHANGED   Details  insulin aspart (NOVOLOG) 100 UNIT/ML injection Inject 15 Units into the skin 3 (three) times daily before meals. Qty: 10 mL, Refills: 2    methylPREDNISolone (MEDROL) 8 MG tablet take 1/2 tablet daily for 4 days Qty: 5 tablet, Refills: 0      CONTINUE these medications which have NOT CHANGED   Details  insulin NPH (HUMULIN N,NOVOLIN N) 100 UNIT/ML injection Inject 15 Units into the skin 2 (two) times daily in the  am and at bedtime.Otho Darner: 10 mL, Refills: 2    albuterol (PROVENTIL HFA;VENTOLIN HFA) 108 (90 BASE) MCG/ACT inhaler Inhale 2 puffs into the lungs every 6 (six) hours as needed. For wheezing     amLODipine (NORVASC) 10 MG tablet Take 1 tablet (10 mg total) by mouth daily. Qty: 30 tablet,  Refills: 2    escitalopram (LEXAPRO) 20 MG tablet Take 1 tablet (20 mg total) by mouth daily. Qty: 30 tablet, Refills: 2      STOP taking these medications     promethazine (PHENERGAN) 12.5 MG tablet        Follow-up Information    Follow up with [facility] on 12/08/2010.   Contact information:   [place]          Signed: Loma Boston Yates 11/24/2010, 6:45 AM

## 2010-11-30 ENCOUNTER — Encounter (HOSPITAL_COMMUNITY): Payer: Self-pay | Admitting: *Deleted

## 2010-11-30 ENCOUNTER — Inpatient Hospital Stay (HOSPITAL_COMMUNITY)
Admission: AD | Admit: 2010-11-30 | Discharge: 2010-12-04 | DRG: 781 | Disposition: A | Discharge status: Home / Self Care | Payer: Medicaid Other | Source: Ambulatory Visit | Attending: Obstetrics & Gynecology | Admitting: Obstetrics & Gynecology

## 2010-11-30 DIAGNOSIS — E119 Type 2 diabetes mellitus without complications: Secondary | ICD-10-CM

## 2010-11-30 DIAGNOSIS — A048 Other specified bacterial intestinal infections: Secondary | ICD-10-CM | POA: Diagnosis present

## 2010-11-30 DIAGNOSIS — O24919 Unspecified diabetes mellitus in pregnancy, unspecified trimester: Secondary | ICD-10-CM

## 2010-11-30 DIAGNOSIS — O10019 Pre-existing essential hypertension complicating pregnancy, unspecified trimester: Secondary | ICD-10-CM

## 2010-11-30 DIAGNOSIS — O10919 Unspecified pre-existing hypertension complicating pregnancy, unspecified trimester: Secondary | ICD-10-CM

## 2010-11-30 DIAGNOSIS — O210 Mild hyperemesis gravidarum: Secondary | ICD-10-CM

## 2010-11-30 LAB — COMPREHENSIVE METABOLIC PANEL
AST: 10 U/L (ref 0–37)
Albumin: 2.9 g/dL — ABNORMAL LOW (ref 3.5–5.2)
CO2: 26 mEq/L (ref 19–32)
Calcium: 9.6 mg/dL (ref 8.4–10.5)
Creatinine, Ser: 1.23 mg/dL — ABNORMAL HIGH (ref 0.50–1.10)
GFR calc non Af Amer: 57 mL/min — ABNORMAL LOW (ref >90)
Total Protein: 7 g/dL (ref 6.0–8.3)

## 2010-11-30 LAB — CBC
MCH: 25.2 pg — ABNORMAL LOW (ref 26.0–34.0)
MCHC: 32.8 g/dL (ref 30.0–36.0)
MCV: 76.9 fL — ABNORMAL LOW (ref 78.0–100.0)
Platelets: 241 10*3/uL (ref 150–400)
RDW: 14 % (ref 11.5–15.5)

## 2010-11-30 LAB — URINE MICROSCOPIC-ADD ON

## 2010-11-30 LAB — URINALYSIS, ROUTINE W REFLEX MICROSCOPIC
Bilirubin Urine: NEGATIVE
Hgb urine dipstick: NEGATIVE
Ketones, ur: 40 mg/dL — AB
Nitrite: NEGATIVE
Protein, ur: 100 mg/dL — AB
Urobilinogen, UA: 0.2 mg/dL (ref 0.0–1.0)

## 2010-11-30 MED ORDER — PRENATAL PLUS 27-1 MG PO TABS
1.0000 | ORAL_TABLET | Freq: Every day | ORAL | Status: DC
  Administered 2010-12-01 – 2010-12-02 (×2): 1 via ORAL
  Filled 2010-11-30 (×5): qty 1

## 2010-11-30 MED ORDER — ONDANSETRON 8 MG/NS 50 ML IVPB
8.0000 mg | Freq: Once | INTRAVENOUS | Status: AC
  Administered 2010-11-30: 8 mg via INTRAVENOUS
  Filled 2010-11-30: qty 8

## 2010-11-30 MED ORDER — INSULIN NPH (HUMAN) (ISOPHANE) 100 UNIT/ML ~~LOC~~ SUSP
8.0000 [IU] | Freq: Every day | SUBCUTANEOUS | Status: DC
  Administered 2010-11-30 – 2010-12-03 (×4): 8 [IU] via SUBCUTANEOUS
  Filled 2010-11-30: qty 10

## 2010-11-30 MED ORDER — INSULIN NPH (HUMAN) (ISOPHANE) 100 UNIT/ML ~~LOC~~ SUSP
8.0000 [IU] | Freq: Every day | SUBCUTANEOUS | Status: DC
  Administered 2010-12-01 – 2010-12-04 (×4): 8 [IU] via SUBCUTANEOUS

## 2010-11-30 MED ORDER — DOCUSATE SODIUM 100 MG PO CAPS
100.0000 mg | ORAL_CAPSULE | Freq: Every day | ORAL | Status: DC
  Administered 2010-12-01: 100 mg via ORAL
  Filled 2010-11-30 (×3): qty 1

## 2010-11-30 MED ORDER — ZOLPIDEM TARTRATE 10 MG PO TABS
10.0000 mg | ORAL_TABLET | Freq: Every evening | ORAL | Status: DC | PRN
  Administered 2010-12-02 – 2010-12-04 (×2): 10 mg via ORAL
  Filled 2010-11-30 (×2): qty 1

## 2010-11-30 MED ORDER — INSULIN ASPART 100 UNIT/ML ~~LOC~~ SOLN
0.0000 [IU] | SUBCUTANEOUS | Status: DC
  Administered 2010-11-30: 3 [IU] via SUBCUTANEOUS
  Administered 2010-12-01 – 2010-12-02 (×2): 2 [IU] via SUBCUTANEOUS
  Administered 2010-12-02 – 2010-12-03 (×2): 3 [IU] via SUBCUTANEOUS
  Administered 2010-12-03 – 2010-12-04 (×3): 2 [IU] via SUBCUTANEOUS
  Administered 2010-12-04: 3 [IU] via SUBCUTANEOUS

## 2010-11-30 MED ORDER — ACETAMINOPHEN 325 MG PO TABS: 650.0000 mg | ORAL_TABLET | ORAL | Status: DC | PRN

## 2010-11-30 MED ORDER — AMOXICILLIN 500 MG PO CAPS
1000.0000 mg | ORAL_CAPSULE | Freq: Two times a day (BID) | ORAL | Status: DC
  Filled 2010-11-30 (×2): qty 2

## 2010-11-30 MED ORDER — CALCIUM CARBONATE ANTACID 500 MG PO CHEW
2.0000 | CHEWABLE_TABLET | ORAL | Status: DC | PRN
  Administered 2010-12-03: 400 mg via ORAL
  Filled 2010-11-30 (×2): qty 2

## 2010-11-30 MED ORDER — DOCUSATE SODIUM 100 MG PO CAPS
100.0000 mg | ORAL_CAPSULE | Freq: Two times a day (BID) | ORAL | Status: DC
  Filled 2010-11-30: qty 1

## 2010-11-30 MED ORDER — PROMETHAZINE HCL 25 MG/ML IJ SOLN
25.0000 mg | INTRAVENOUS | Status: DC
  Administered 2010-11-30 – 2010-12-01 (×2): 25 mg via INTRAVENOUS
  Filled 2010-11-30: qty 1

## 2010-11-30 MED ORDER — PROMETHAZINE HCL 25 MG/ML IJ SOLN
25.0000 mg | Freq: Once | INTRAMUSCULAR | Status: AC
  Administered 2010-11-30: 25 mg via INTRAVENOUS
  Filled 2010-11-30: qty 1

## 2010-11-30 MED ORDER — ONDANSETRON 8 MG/NS 50 ML IVPB
8.0000 mg | Freq: Three times a day (TID) | INTRAVENOUS | Status: DC
  Administered 2010-12-01 – 2010-12-03 (×8): 8 mg via INTRAVENOUS
  Filled 2010-11-30 (×11): qty 8

## 2010-11-30 MED ORDER — ALBUTEROL SULFATE HFA 108 (90 BASE) MCG/ACT IN AERS: 2.0000 | INHALATION_SPRAY | Freq: Four times a day (QID) | RESPIRATORY_TRACT | Status: DC | PRN

## 2010-11-30 MED ORDER — LACTATED RINGERS IV BOLUS (SEPSIS)
1000.0000 mL | Freq: Once | INTRAVENOUS | Status: AC
  Administered 2010-11-30: 1000 mL via INTRAVENOUS

## 2010-11-30 MED ORDER — INSULIN ASPART 100 UNIT/ML ~~LOC~~ SOLN
0.0000 [IU] | Freq: Three times a day (TID) | SUBCUTANEOUS | Status: DC
  Filled 2010-11-30: qty 3

## 2010-11-30 MED ORDER — PANTOPRAZOLE SODIUM 40 MG IV SOLR
40.0000 mg | Freq: Two times a day (BID) | INTRAVENOUS | Status: DC
  Administered 2010-11-30 – 2010-12-01 (×3): 40 mg via INTRAVENOUS
  Filled 2010-11-30 (×4): qty 40

## 2010-11-30 MED ORDER — LORAZEPAM 2 MG/ML IJ SOLN
1.0000 mg | Freq: Four times a day (QID) | INTRAMUSCULAR | Status: DC | PRN
  Administered 2010-12-01 – 2010-12-04 (×12): 1 mg via INTRAVENOUS
  Filled 2010-11-30 (×13): qty 1

## 2010-11-30 MED ORDER — PROMETHAZINE HCL 25 MG/ML IJ SOLN: 12.5000 mg | Freq: Three times a day (TID) | INTRAVENOUS | Status: DC | PRN

## 2010-11-30 MED ORDER — ONDANSETRON HCL 4 MG/2ML IJ SOLN: 8.0000 mg | Freq: Once | INTRAMUSCULAR | Status: DC

## 2010-11-30 MED ORDER — ESCITALOPRAM OXALATE 20 MG PO TABS
20.0000 mg | ORAL_TABLET | Freq: Every day | ORAL | Status: DC
  Filled 2010-11-30 (×2): qty 1

## 2010-11-30 MED ORDER — CLARITHROMYCIN 500 MG PO TABS
500.0000 mg | ORAL_TABLET | Freq: Two times a day (BID) | ORAL | Status: DC
  Filled 2010-11-30 (×2): qty 1

## 2010-11-30 MED ORDER — LORAZEPAM 2 MG/ML IJ SOLN
1.0000 mg | Freq: Once | INTRAMUSCULAR | Status: AC
  Administered 2010-11-30: 2 mg via INTRAVENOUS
  Filled 2010-11-30 (×2): qty 1

## 2010-11-30 NOTE — Progress Notes (Signed)
Patient states she started vomiting again yesterday. Patient has been admitted for one week and had a feeding tube in. No pain at this time.

## 2010-11-30 NOTE — ED Provider Notes (Signed)
History   Pt presents today c/o severe N&V. She was recently released from the hospital secondary to hyperemesis and she has been seen multiple times for the same. She denies vag dc, bleeding, or lower abd pain. She states she vomits every time she tries to eat or drink.  Chief Complaint  Patient presents with  . Emesis During Pregnancy   HPI  OB History    Grav Para Term Preterm Abortions TAB SAB Ect Mult Living   4 2 2  1   1  2       Past Medical History  Diagnosis Date  . Asthma   . Diabetes mellitus   . Mental disorder   . Depression     History reviewed. No pertinent past surgical history.  History reviewed. No pertinent family history.  History  Substance Use Topics  . Smoking status: Never Smoker   . Smokeless tobacco: Never Used  . Alcohol Use: Yes    Allergies:  Allergies  Allergen Reactions  . Morphine And Related Nausea And Vomiting    Prescriptions prior to admission  Medication Sig Dispense Refill  . albuterol (PROVENTIL HFA;VENTOLIN HFA) 108 (90 BASE) MCG/ACT inhaler Inhale 2 puffs into the lungs every 6 (six) hours as needed. For wheezing       . amLODipine (NORVASC) 10 MG tablet Take 1 tablet (10 mg total) by mouth daily.  30 tablet  2  . amoxicillin (AMOXIL) 500 MG capsule Take 2 capsules (1,000 mg total) by mouth every 12 (twelve) hours.  14 capsule  0  . clarithromycin (BIAXIN) 500 MG tablet Take 1 tablet (500 mg total) by mouth every 12 (twelve) hours.  14 tablet  0  . docusate sodium 100 MG CAPS Take 100 mg by mouth 2 (two) times daily.  30 capsule  0  . escitalopram (LEXAPRO) 20 MG tablet Take 1 tablet (20 mg total) by mouth daily.  30 tablet  2  . insulin aspart (NOVOLOG) 100 UNIT/ML injection Inject 15 Units into the skin 3 (three) times daily before meals.  10 mL  2  . insulin NPH (HUMULIN N,NOVOLIN N) 100 UNIT/ML injection Inject 15 Units into the skin 2 (two) times daily in the am and at bedtime..  10 mL  2  . methylPREDNISolone (MEDROL) 8  MG tablet take 1/2 tablet daily for 4 days  5 tablet  0  . pantoprazole (PROTONIX) 40 MG tablet Take 1 tablet (40 mg total) by mouth daily at 12 noon.  60 tablet  0  . prenatal vitamin w/FE, FA (PRENATAL 1 + 1) 27-1 MG TABS Take 1 tablet by mouth daily.  30 each  0    Review of Systems  Constitutional: Negative for fever.  Respiratory: Negative for cough, sputum production, shortness of breath and wheezing.   Cardiovascular: Negative for chest pain.  Gastrointestinal: Positive for nausea and vomiting. Negative for abdominal pain, diarrhea and constipation.  Genitourinary: Negative for dysuria, urgency, frequency and hematuria.  Neurological: Positive for dizziness. Negative for headaches.  Psychiatric/Behavioral: Negative for depression and suicidal ideas.   Physical Exam   Blood pressure 116/75, pulse 107, temperature 98.1 F (36.7 C), temperature source Oral, resp. rate 18, height 5' 4.5" (1.638 m), weight 170 lb 3.2 oz (77.202 kg), last menstrual period 09/19/2010.  Physical Exam  Nursing note and vitals reviewed. Constitutional: She is oriented to person, place, and time. She appears well-developed and well-nourished. No distress.  HENT:  Head: Normocephalic and atraumatic.  Eyes: EOM are  normal. Pupils are equal, round, and reactive to light.  Cardiovascular: Normal rate, regular rhythm and normal heart sounds.  Exam reveals no gallop and no friction rub.   No murmur heard. Respiratory: Effort normal and breath sounds normal. No respiratory distress. She has no wheezes. She has no rales. She exhibits no tenderness.  GI: Soft. She exhibits no mass. There is no tenderness. There is no rebound and no guarding.  Neurological: She is alert and oriented to person, place, and time.  Skin: Skin is warm and dry. She is not diaphoretic.  Psychiatric: She has a normal mood and affect. Her behavior is normal. Judgment and thought content normal.    MAU Course  Procedures  Results for  orders placed during the hospital encounter of 11/30/10 (from the past 24 hour(s))  URINALYSIS, ROUTINE W REFLEX MICROSCOPIC     Status: Abnormal   Collection Time   11/30/10  2:00 PM      Component Value Range   Color, Urine YELLOW  YELLOW    Appearance CLOUDY (*) CLEAR    Specific Gravity, Urine >1.030 (*) 1.005 - 1.030    pH 7.0  5.0 - 8.0    Glucose, UA NEGATIVE  NEGATIVE (mg/dL)   Hgb urine dipstick NEGATIVE  NEGATIVE    Bilirubin Urine NEGATIVE  NEGATIVE    Ketones, ur 40 (*) NEGATIVE (mg/dL)   Protein, ur 100 (*) NEGATIVE (mg/dL)   Urobilinogen, UA 0.2  0.0 - 1.0 (mg/dL)   Nitrite NEGATIVE  NEGATIVE    Leukocytes, UA TRACE (*) NEGATIVE   URINE MICROSCOPIC-ADD ON     Status: Abnormal   Collection Time   11/30/10  2:00 PM      Component Value Range   Squamous Epithelial / LPF MANY (*) RARE    WBC, UA 3-6  <3 (WBC/hpf)   Bacteria, UA FEW (*) RARE    Casts GRANULAR CAST (*) NEGATIVE    Urine-Other MUCOUS PRESENT       Assessment and Plan  Hyperemesis: discussed pt with Dr. Gala Romney. Will admit.  Alinda Deem. Rice III, DrHSc, MPAS, PA-C  11/30/2010, 2:18 PM   Eliott Nine, PA 11/30/10 1435

## 2010-11-30 NOTE — ED Notes (Signed)
Vomited approx. 100cc brownish bile with flecks and strands of blood.

## 2010-11-30 NOTE — ED Provider Notes (Signed)
Attestation of Attending Supervision of Advanced Practitioner: Evaluation and management procedures were performed by the PA/NP/CNM/OB Fellow under my supervision/collaboration. Chart reviewed, and agree with management and plan.  Verita Schneiders, M.D. 11/30/2010 5:10 PM

## 2010-11-30 NOTE — H&P (Signed)
[name] is a 30 y.o. female [phone] at 10.2 weeks presenting with recurrent intractable emesis. Was recently discharged on 11/6 after second admission for hyperemesis requiring NG tube, IV steroids and was positive for H. Pylori. After patient was discharged, she was unable to fill any prescription medications including anti-emetic, steroid and antibiotics. She began having emesis last evening. The emesis persisted this morning after she ate some spaghetti. Is unable to keep down any fluids or solid food, and she is repeatedly retching with bilious and bloody emesis in the MAU today. Started noticing bloody streaks this morning. Denies any abdominal or chest pain, HA, edema, dysuria, diarrhea, constipation, fevers, chills.   Maternal Medical History:  Reason for admission: Reason for admission: nausea.  Prenatal Complications - Diabetes: type 2. Diabetes is managed by insulin injections.      OB History    Grav Para Term Preterm Abortions TAB SAB Ect Mult Living   4 2 2  1   1  2      Past Medical History  Diagnosis Date  . Asthma   . Diabetes mellitus   . Mental disorder   . Depression    History reviewed. No pertinent past surgical history.  Family History: family history is not on file.  Social History:  reports that she has never smoked. She has never used smokeless tobacco. She reports that she drinks alcohol. She reports that she uses illicit drugs (Cocaine and Marijuana).  No current facility-administered medications on file prior to encounter.   Current Outpatient Prescriptions on File Prior to Encounter  Medication Sig Dispense Refill  . albuterol (PROVENTIL HFA;VENTOLIN HFA) 108 (90 BASE) MCG/ACT inhaler Inhale 2 puffs into the lungs every 6 (six) hours as needed. For wheezing       . amLODipine (NORVASC) 10 MG tablet Take 1 tablet (10 mg total) by mouth daily.  30 tablet  2  . amoxicillin (AMOXIL) 500 MG capsule Take 2 capsules (1,000 mg total) by mouth every 12  (twelve) hours.  14 capsule  0  . clarithromycin (BIAXIN) 500 MG tablet Take 1 tablet (500 mg total) by mouth every 12 (twelve) hours.  14 tablet  0  . docusate sodium 100 MG CAPS Take 100 mg by mouth 2 (two) times daily.  30 capsule  0  . escitalopram (LEXAPRO) 20 MG tablet Take 1 tablet (20 mg total) by mouth daily.  30 tablet  2  . insulin aspart (NOVOLOG) 100 UNIT/ML injection Inject 15 Units into the skin 3 (three) times daily before meals.  10 mL  2  . insulin NPH (HUMULIN N,NOVOLIN N) 100 UNIT/ML injection Inject 15 Units into the skin 2 (two) times daily in the am and at bedtime..  10 mL  2  . methylPREDNISolone (MEDROL) 8 MG tablet take 1/2 tablet daily for 4 days  5 tablet  0  . pantoprazole (PROTONIX) 40 MG tablet Take 1 tablet (40 mg total) by mouth daily at 12 noon.  60 tablet  0  . prenatal vitamin w/FE, FA (PRENATAL 1 + 1) 27-1 MG TABS Take 1 tablet by mouth daily.  30 each  0    Review of Systems  Gastrointestinal: Positive for nausea.  See HPI    Blood pressure 116/75, pulse 107, temperature 98.1 F (36.7 C), temperature source Oral, resp. rate 18, height 5' 4.5" (1.638 m), weight 77.202 kg (170 lb 3.2 oz), last menstrual period 09/19/2010. Exam Physical Exam  Vitals reviewed. Constitutional: She is oriented to  person, place, and time. She appears well-developed and well-nourished.       Uncontrollable retching  HENT:  Head: Normocephalic and atraumatic.  Mouth/Throat: Oropharynx is clear and moist.       Dry mucus membranes  Cardiovascular: Regular rhythm and intact distal pulses.   No murmur heard.      tachycardia  Respiratory: Effort normal and breath sounds normal. No respiratory distress. She has no wheezes. She has no rales.  GI: Soft. Bowel sounds are normal. She exhibits no distension. There is no tenderness. There is no rebound and no guarding.  Musculoskeletal: She exhibits no edema and no tenderness.  Neurological: She is alert and oriented to person,  place, and time. No cranial nerve deficit. Coordination normal.    Prenatal labs: ABO, Rh:   Antibody:   Rubella:   RPR:    HBsAg:    HIV:    GBS:    Results for orders placed during the hospital encounter of 11/30/10 (from the past 48 hour(s))  URINALYSIS, ROUTINE W REFLEX MICROSCOPIC     Status: Abnormal   Collection Time   11/30/10  2:00 PM      Component Value Range Comment   Color, Urine YELLOW  YELLOW     Appearance CLOUDY (*) CLEAR     Specific Gravity, Urine >1.030 (*) 1.005 - 1.030     pH 7.0  5.0 - 8.0     Glucose, UA NEGATIVE  NEGATIVE (mg/dL)    Hgb urine dipstick NEGATIVE  NEGATIVE     Bilirubin Urine NEGATIVE  NEGATIVE     Ketones, ur 40 (*) NEGATIVE (mg/dL)    Protein, ur 100 (*) NEGATIVE (mg/dL)    Urobilinogen, UA 0.2  0.0 - 1.0 (mg/dL)    Nitrite NEGATIVE  NEGATIVE     Leukocytes, UA TRACE (*) NEGATIVE    URINE MICROSCOPIC-ADD ON     Status: Abnormal   Collection Time   11/30/10  2:00 PM      Component Value Range Comment   Squamous Epithelial / LPF MANY (*) RARE     WBC, UA 3-6  <3 (WBC/hpf)    Bacteria, UA FEW (*) RARE     Casts GRANULAR CAST (*) NEGATIVE     Urine-Other MUCOUS PRESENT      Assessment/Plan: 1. Hyperemesis Gravidarum- Will admit and make patient NPO and bowel rest. Dose with IV phenergan and zofran for now. If emesis not improved in next 12 hours, will replace NG tube and consider parenteral feeding/medications. Hold on IV steroids given diabetic status. GI prophylaxis with IV protonix BID in setting of GI bleeding likely from esophageal irritation. IV hydration LR at 125 cc/hr for dehydration.   2. H. Pylori infection. Uncertain if contributing to current nausea/emesis. Was unable to afford outpatient meds. Will restart triple therapy when patient tolerating oral medication or via tube with amoxicillin and clarithromycin. High dose PPI currently.   3. Diabetes Mellitus, type 2- Monitor glucoses closely and control with insulin sliding  scale while inpatient. Will hold home NPH and Novolog 15 u TID w meals.   4. Hypertension, Chronic- BP controlled currently. Will start IV Labetalol for prn control as needed  5. Possible UTI. Contaminated sample with squams, patient is asymptomatic. Will f/u urine culture for treatment needs.  6. PPX. PPI and early ambulation for VTE ppx.   Case d/w Deidre Poe and Dr. Silas [place].     [name] 11/30/2010, 3:51 PM

## 2010-11-30 NOTE — ED Notes (Signed)
Attempted to draw blood with IV start. Blood would not flow into tubes, did not want to jeopardize IV. Lab aware that blood not drawn. Will try again after patient better hydrated.

## 2010-12-01 ENCOUNTER — Inpatient Hospital Stay (HOSPITAL_COMMUNITY): Payer: Medicaid Other

## 2010-12-01 LAB — GLUCOSE, CAPILLARY
Glucose-Capillary: 124 mg/dL — ABNORMAL HIGH (ref 70–99)
Glucose-Capillary: 68 mg/dL — ABNORMAL LOW (ref 70–99)
Glucose-Capillary: 85 mg/dL (ref 70–99)

## 2010-12-01 LAB — CBC
HCT: 26.6 % — ABNORMAL LOW (ref 36.0–46.0)
MCV: 77.3 fL — ABNORMAL LOW (ref 78.0–100.0)
RBC: 3.44 MIL/uL — ABNORMAL LOW (ref 3.87–5.11)
WBC: 8 10*3/uL (ref 4.0–10.5)

## 2010-12-01 MED ORDER — AMOXICILLIN 250 MG/5ML PO SUSR
1000.0000 mg | Freq: Two times a day (BID) | ORAL | Status: DC
  Administered 2010-12-01 – 2010-12-04 (×7): 1000 mg via NASOGASTRIC
  Filled 2010-12-01 (×10): qty 20

## 2010-12-01 MED ORDER — CLARITHROMYCIN 250 MG/5ML PO SUSR
500.0000 mg | Freq: Two times a day (BID) | ORAL | Status: DC
  Administered 2010-12-01 – 2010-12-04 (×7): 500 mg via NASOGASTRIC
  Filled 2010-12-01 (×9): qty 10

## 2010-12-01 MED ORDER — AMLODIPINE BESYLATE 10 MG PO TABS
10.0000 mg | ORAL_TABLET | Freq: Every day | ORAL | Status: DC
  Administered 2010-12-01 – 2010-12-03 (×3): 10 mg
  Filled 2010-12-01 (×5): qty 1

## 2010-12-01 MED ORDER — JEVITY 1.2 CAL PO LIQD
1000.0000 mL | ORAL | Status: DC
  Administered 2010-12-01 – 2010-12-03 (×3): 1000 mL
  Filled 2010-12-01 (×6): qty 1000

## 2010-12-01 MED ORDER — LACTATED RINGERS IV SOLN
INTRAVENOUS | Status: DC
  Administered 2010-12-01 – 2010-12-04 (×10): via INTRAVENOUS

## 2010-12-01 MED ORDER — GLYCOPYRROLATE 0.2 MG/ML IJ SOLN
0.1000 mg | Freq: Three times a day (TID) | INTRAMUSCULAR | Status: DC
  Administered 2010-12-01 – 2010-12-03 (×9): 0.1 mg via INTRAVENOUS
  Filled 2010-12-01 (×13): qty 0.5

## 2010-12-01 MED ORDER — ESCITALOPRAM OXALATE 20 MG PO TABS
20.0000 mg | ORAL_TABLET | Freq: Every day | ORAL | Status: DC
  Administered 2010-12-01 – 2010-12-03 (×3): 20 mg
  Filled 2010-12-01 (×5): qty 1

## 2010-12-01 MED ORDER — AMLODIPINE 1 MG/ML ORAL SUSPENSION: 10.0000 mg | Freq: Every day | ORAL | Status: DC

## 2010-12-01 NOTE — Progress Notes (Signed)
INITIAL ADULT NUTRITION ASSESSMENT Date: 12/01/2010   Time: 1:48 PM Reason for Assessment: Tube feeding consult  ASSESSMENT: Female 30 y.o. 10.2 weeks IUP  Dx: <principal problem not specified> Patient Active Problem List  Diagnoses  . Type 2 diabetes mellitus  . Hyperemesis gravidarum before end of [redacted] week gestation, dehydration  . Chronic hypertension in pregnancy  . Depression  . Asthma  . H. pylori infection   Hx:  Past Medical History  Diagnosis Date  . Asthma   . Diabetes mellitus   . Mental disorder   . Depression    Related Meds:     . amLODipine  10 mg Per Tube Daily  . amoxicillin  1,000 mg Per NG tube Q12H  . clarithromycin  500 mg Per NG tube Q12H  . docusate sodium  100 mg Oral BID  . docusate sodium  100 mg Oral Daily  . escitalopram  20 mg Per Tube Daily  . glycopyrrolate  0.1 mg Intravenous TID  . insulin aspart  0-15 Units Subcutaneous Q4H  . insulin NPH  8 Units Subcutaneous QHS  . insulin NPH  8 Units Subcutaneous QAC breakfast  . lactated ringers  1,000 mL Intravenous Once  . LORazepam  1 mg Intravenous Once  . ondansetron (ZOFRAN) IV  8 mg Intravenous Q8H  . ondansetron (ZOFRAN) IV  8 mg Intravenous Once  . pantoprazole (PROTONIX) IV  40 mg Intravenous Q12H  . prenatal vitamin w/FE, FA  1 tablet Oral Daily  . promethazine  25 mg Intravenous Once  . DISCONTD: amLODipine  10 mg Per Tube Daily  . DISCONTD: amoxicillin  1,000 mg Oral Q12H  . DISCONTD: clarithromycin  500 mg Oral Q12H  . DISCONTD: escitalopram  20 mg Oral Daily  . DISCONTD: insulin aspart  0-5 Units Subcutaneous TID WC  . DISCONTD: ondansetron  8 mg Intravenous Once    Ht: 5\' 7"  (170.2 cm)  Wt: 170 lb (77.111 kg)  Ideal Wt: 135 lbs % Ideal Wt: 126%  Usual Wt: 181 lbs % Usual Wt: 94% Pt with excessive weight loss (>5% usual) and likely with mild to moderate degree of malnutrition Body mass index is 26.63 kg/(m^2).  Food/Nutrition Related Hx: Discharged home 11/6 after  tolerating po's. Discharge weight was 178 lbs  Labs: Hct 26.6 %  Intake: I/O last 3 completed shifts: In: 1343.8 [P.O.:50; I.V.:1243.8; IV Piggyback:50] Out: 325 [Emesis/NG output:325]     Output:   Diet Order: NPO  Supplements/Tube Feeding:  IVF:    feeding supplement (JEVITY 1.2)   lactated ringers Last Rate: 150 mL/hr at 12/01/10 1127  promethazine (PHENERGAN) IV infusion Last Rate: 25 mg (12/01/10 0251)    Estimated Nutritional Needs:   Kcal: 18-2100 Protein: 77-87 g Fluid: 2.1 L  NUTRITION DIAGNOSIS: -Inadequate oral intake (NI-2.1). R/t hyperemesis aeb weight loss, nausea/vomiting Status: Ongoing  MONITORING/EVALUATION(Goals): PO or enteral intake that will meet estimate   EDUCATION NEEDS: -No education needs identified at this time  INTERVENTION: Jevity 1.2 to be initiated at 20 ml/hr Advance by 10 ml q 6 hours to a goal rate of 65 ml/hr Decrease IVF for every increase in TF Initiate PO diet when nausea resolves  Dietitian [id number]  DOCUMENTATION CODES Per approved criteria  -Non-severe (moderate) malnutrition in the context of social or environmental circumstances    Brooke Payes,KATHY 12/01/2010, 1:48 PM

## 2010-12-01 NOTE — Progress Notes (Signed)
Subjective: Patient reports nausea, vomiting and no problems voiding.  Last emesis was ~2am this morning. Still denies any abd pain, dysuria.   Objective: I have reviewed patient's vital signs, intake and output, medications, labs and microbiology.    Temp Readings from Last 3 Encounters:  12/01/10 97.9 F (36.6 C) Oral  11/24/10 98.4 F (36.9 C) Oral  11/13/10 98.7 F (37.1 C) Oral   BP Readings from Last 3 Encounters:  12/01/10 130/88  11/24/10 108/72  11/13/10 115/71   Pulse Readings from Last 3 Encounters:  12/01/10 103  11/24/10 93  11/13/10 77    General: alert, no distress and flat affect Resp: no distress GI: soft, non-tender; bowel sounds normal; no masses,  no organomegaly Extremities: extremities normal, atraumatic, no cyanosis or edema  Lab Results  Component Value Date   WBC 8.0 12/01/2010   HGB 8.7* 12/01/2010   HCT 26.6* 12/01/2010   MCV 77.3* 12/01/2010   PLT 227 12/01/2010    CBG (last 3)   Basename 11/30/10 2043  GLUCAP 160*  0200: 86 Fasting am: 93  Urine ctx: pending  Assessment/Plan:  30 yo G4P2012 with hx of DM-2 presents with dehydration secondary to recurrent hyperemesis gravidarum.    1. Hyperemesis Gravidarum- Patient NPO and bowel rest, still having emesis and nausea. Will place Panda tube and consult nutrition for parenteral feeding and change oral medications to tube route. Continue IV phenergan gtt and scheduled zofran for now. Hold on IV steroids given diabetic status. GI prophylaxis with IV protonix 40mg  BID in setting of GI bleeding likely from esophageal irritation. Follow up CBC in am for blood loss anemia as hgb has fallen, but gross hematemesis has resolved overnight. Will start robinul for excess salivation. IV hydration LR MIVF for dehydration.   2. H. Pylori infection. Uncertain if contributing to current nausea/emesis. Was unable to afford outpatient meds. Will restart triple therapy via tube with amoxicillin and  clarithromycin. High dose PPI currently.   3. Diabetes Mellitus, type 2- Well controlled today with fasting 93. Monitor q4 hr CBGs, half dose home NPH (8u BID) and control with insulin sliding scale while inpatient. At home takes NPH 15 U BID and Novolog 15 u TID w meals.   4. Hypertension, Chronic- BP controlled currently, but uptrending. Will restart home dose norvasc per tube and consider IV Labetalol for prn control as needed if >160/100.   5. Possible UTI. Contaminated sample with squams, patient is asymptomatic. Will f/u urine culture for treatment needs.   6. PPX. PPI and early ambulation for VTE ppx.   7. FEN. On MIVF with LR at 125cc/hr currently. Will increase rate to 150/hr given persistent tachycardia likely 2/2 dehydration.  May need to add D5 to fluids pending initiation of NG feeding. Will recheck BMET in am for slightly elevated cr likely prerenal and to check electrolytes, potassium.    LOS: 1 day    [facility] 12/01/2010, 7:48 AM

## 2010-12-01 NOTE — Progress Notes (Signed)
Results back from abdominal X-ray, tube pulled out some and reinserted. Another X-ray ordered to recheck placement.

## 2010-12-01 NOTE — Progress Notes (Addendum)
Please see complete [facility] note on chart.  UR Chart review completed.

## 2010-12-02 ENCOUNTER — Inpatient Hospital Stay (HOSPITAL_COMMUNITY): Payer: Medicaid Other

## 2010-12-02 DIAGNOSIS — O10019 Pre-existing essential hypertension complicating pregnancy, unspecified trimester: Secondary | ICD-10-CM

## 2010-12-02 LAB — CBC
HCT: 29.2 % — ABNORMAL LOW (ref 36.0–46.0)
Hemoglobin: 9.6 g/dL — ABNORMAL LOW (ref 12.0–15.0)
MCH: 25.6 pg — ABNORMAL LOW (ref 26.0–34.0)
MCHC: 32.9 g/dL (ref 30.0–36.0)

## 2010-12-02 LAB — BASIC METABOLIC PANEL
BUN: 5 mg/dL — ABNORMAL LOW (ref 6–23)
Calcium: 9.6 mg/dL (ref 8.4–10.5)
GFR calc non Af Amer: 63 mL/min — ABNORMAL LOW (ref >90)
Glucose, Bld: 162 mg/dL — ABNORMAL HIGH (ref 70–99)
Potassium: 3.9 mEq/L (ref 3.5–5.1)

## 2010-12-02 LAB — GLUCOSE, CAPILLARY
Glucose-Capillary: 163 mg/dL — ABNORMAL HIGH (ref 70–99)
Glucose-Capillary: 115 mg/dL — ABNORMAL HIGH (ref 70–99)

## 2010-12-02 MED ORDER — PANTOPRAZOLE SODIUM 40 MG PO PACK
40.0000 mg | PACK | Freq: Two times a day (BID) | ORAL | Status: DC
  Administered 2010-12-02 – 2010-12-03 (×4): 40 mg
  Filled 2010-12-02 (×7): qty 20

## 2010-12-02 MED ORDER — DOCUSATE SODIUM 50 MG/5ML PO LIQD
100.0000 mg | Freq: Two times a day (BID) | ORAL | Status: DC
  Administered 2010-12-02 – 2010-12-03 (×4): 100 mg
  Filled 2010-12-02 (×7): qty 10

## 2010-12-02 NOTE — Plan of Care (Signed)
Problem: Inadequate Intake (NI-2.1) Goal: Food and/or nutrient delivery Individualized approach for food/nutrient provision. Outcome: Progressing RN reporting that Jevity 1.2 was running at 40 ml/hr this morning. Feeding tube was vomited up, and then replaced by RN. Jevity 1.2 was restarted at 20 ml/hr and advanced to 30 ml/hr. The tube feeding has been tolerated well this afternoon. There is a planned advancement  To 40 ml/hr.

## 2010-12-02 NOTE — Progress Notes (Signed)
Jevity feeding not increased at this time due to patients nausea and some vomiting.  Will remain at 28ml/hr.

## 2010-12-02 NOTE — Progress Notes (Signed)
The patient is receiving Protonix by the intravenous route.  Based on criteria approved by the Pharmacy and [place], the medication is being converted to the equivalent oral dose form.  These criteria include: -No Active GI bleeding -Able to tolerate diet of full liquids (or better) or tube feeding -Able to tolerate other medications by the oral or enteral route  If you have any questions about this conversion, please contact the Pharmacy Department (ext [phone]).  Thank you.

## 2010-12-02 NOTE — Progress Notes (Signed)
  Subjective: Less nausea/vomiting than previously; tube feeds started yesterday and pt seems to be tol well  Objective: Temp:  [98.1 F (36.7 C)-98.5 F (36.9 C)] 98.1 F (36.7 C) (11/14 0526) Pulse Rate:  [110-124] 110  (11/14 0526) Cardiac Rhythm:  [-]  Resp:  [18] 18  (11/14 0526) BP: (107-153)/(72-96) 126/85 mmHg (11/14 0526) SpO2:  [98 %-100 %] 98 % (11/14 0526) Weight:  [77.82 kg (171 lb 9 oz)] 171 lb 9 oz (77.82 kg) (11/14 0526)  CBG (last 3)   Basename 12/02/10 0521 12/01/10 2346 12/01/10 2015  GLUCAP 163* 108* 108*   BMET    Component Value Date/Time   NA 131* 12/02/2010 0515   K 3.9 12/02/2010 0515   CL 95* 12/02/2010 0515   CO2 27 12/02/2010 0515   GLUCOSE 162* 12/02/2010 0515   BUN 5* 12/02/2010 0515   CREATININE 1.14* 12/02/2010 0515   CALCIUM 9.6 12/02/2010 0515   GFRNONAA 63* 12/02/2010 0515   GFRAA 73* 12/02/2010 0515    Assessment/Plan:  30 yo [id number] with hx of DM-2 presents with dehydration secondary to recurrent hyperemesis gravidarum.  1. Hyperemesis Gravidarum- Panda tube placed and feeds going. Continue IV phenergan gtt and scheduled zofran for now. Hold on IV steroids given diabetic status. GI prophylaxis with IV protonix 40mg  BID in setting of GI bleeding likely from esophageal irritation. Will start robinul for excess salivation. IV hydration LR MIVF for dehydration. Will order Home Health consult for possible d/c later today. Will also advance diet as tol. 2. H. Pylori infection. Receiving triple therapy via tube with amoxicillin and clarithromycin. High dose PPI currently.  3. Diabetes Mellitus, type 2-  Monitor q4 hr CBGs, half dose home NPH (8u BID) and control with insulin sliding scale while inpatient. At home takes NPH 15 U BID and Novolog 15 u TID w meals.  4. Hypertension, Chronic- Receiving home dose norvasc per tube and consider IV Labetalol for prn control as needed if >160/100.  5. Possible UTI.- urine cult pending  6. PPX. PPI and early  ambulation for VTE ppx.   Serita Grammes 12/02/2010 7:55 AM

## 2010-12-03 LAB — GLUCOSE, CAPILLARY
Glucose-Capillary: 133 mg/dL — ABNORMAL HIGH (ref 70–99)
Glucose-Capillary: 105 mg/dL — ABNORMAL HIGH (ref 70–99)

## 2010-12-03 MED ORDER — METOCLOPRAMIDE HCL 5 MG/ML IJ SOLN
10.0000 mg | Freq: Four times a day (QID) | INTRAMUSCULAR | Status: DC
  Administered 2010-12-03 – 2010-12-04 (×3): 10 mg via INTRAVENOUS
  Filled 2010-12-03 (×3): qty 2

## 2010-12-03 NOTE — Progress Notes (Signed)
Tube feeding restarted at 62ml/hr.

## 2010-12-03 NOTE — H&P (Signed)
Will give pt 8 units NPH twice a day plus sliding scale until eating.  Will monitor CBGs closely.

## 2010-12-03 NOTE — Progress Notes (Signed)
Jevity feeding not increased tonight due to patient vomiting x2.  Remains nauseated and c/o anxiety. Asking for meds for anxiety 1 1/2 hours before time.  Explained to her that I could not give it that early, and I would bring it when it is time.

## 2010-12-03 NOTE — Progress Notes (Signed)
Pt seen and evaluated.  Agree with above note.  Jerell Demery H. 12/03/2010 12:32 PM

## 2010-12-03 NOTE — Progress Notes (Signed)
FACULTY PRACTICE ANTEPARTUM(COMPREHENSIVE) NOTE  [name] is a 30 y.o. [phone] at [redacted]w[redacted]d by early ultrasound who is admitted for Hyperemesis and Diabetes mellitus type I..  She was being considered for discharge home today on NG feedings, but experienced multiple episodes of vomiting last night. Length of Stay:  3  Days  Subjective:   Vitals:  Blood pressure 138/89, pulse 114, temperature 98.2 F (36.8 C), temperature source Oral, resp. rate 20, height 5\' 7"  (1.702 m), weight 77.82 kg (171 lb 9 oz), last menstrual period 09/19/2010, SpO2 100.00%. Physical Examination:  Labs:  Recent Results (from the past 24 hour(s))  GLUCOSE, CAPILLARY   Collection Time   12/02/10 12:19 PM      Component Value Range   Glucose-Capillary 116 (*) 70 - 99 (mg/dL)   Comment 1 Documented in Chart     Comment 2 Notify RN    GLUCOSE, CAPILLARY   Collection Time   12/02/10  4:05 PM      Component Value Range   Glucose-Capillary 119 (*) 70 - 99 (mg/dL)   Comment 1 Documented in Chart     Comment 2 Notify RN    GLUCOSE, CAPILLARY   Collection Time   12/02/10  8:18 PM      Component Value Range   Glucose-Capillary 115 (*) 70 - 99 (mg/dL)   Comment 1 Documented in Chart     Comment 2 Notify RN    GLUCOSE, CAPILLARY   Collection Time   12/03/10 12:00 AM      Component Value Range   Glucose-Capillary 82  70 - 99 (mg/dL)  GLUCOSE, CAPILLARY   Collection Time   12/03/10  3:54 AM      Component Value Range   Glucose-Capillary 110 (*) 70 - 99 (mg/dL)  GLUCOSE, CAPILLARY   Collection Time   12/03/10  7:58 AM      Component Value Range   Glucose-Capillary 133 (*) 70 - 99 (mg/dL)   Medications:  Scheduled    . amLODipine  10 mg Per Tube Daily  . amoxicillin  1,000 mg Per NG tube Q12H  . clarithromycin  500 mg Per NG tube Q12H  . docusate  100 mg Per Tube BID  . escitalopram  20 mg Per Tube Daily  . glycopyrrolate  0.1 mg Intravenous TID  . insulin aspart  0-15 Units Subcutaneous Q4H  .  insulin NPH  8 Units Subcutaneous QHS  . insulin NPH  8 Units Subcutaneous QAC breakfast  . ondansetron (ZOFRAN) IV  8 mg Intravenous Q8H  . pantoprazole sodium  40 mg Per Tube BID  . prenatal vitamin w/FE, FA  1 tablet Oral Daily  . DISCONTD: docusate sodium  100 mg Oral BID  . DISCONTD: docusate sodium  100 mg Oral Daily  . DISCONTD: pantoprazole (PROTONIX) IV  40 mg Intravenous Q12H   I have reviewed the patient's current medications.  ASSESSMENT: Patient Active Problem List  Diagnoses  . Type 2 diabetes mellitus  . Hyperemesis gravidarum before end of [redacted] week gestation, dehydration  . Chronic hypertension in pregnancy  . Depression  . Asthma  . H. pylori infection    PLAN: Hyperemesis, IUP 10+5 weeks , type I DM, with intolerance of Jevity feedings at 65 cc/hr, reduced by Dietary to 50 cc/hr Consider d/c home on continued jevity feedings once a tolerated level of feeding identified. Kimanh Templeman V 12/03/2010,9:13 AM

## 2010-12-03 NOTE — Progress Notes (Signed)
Jevity feeding increased to 10ml/hr.

## 2010-12-03 NOTE — Progress Notes (Signed)
Patient vomited 19ml tan colored emesis, Jevity rate reduced back to 82ml/hr

## 2010-12-04 ENCOUNTER — Inpatient Hospital Stay (HOSPITAL_COMMUNITY): Payer: Medicaid Other

## 2010-12-04 LAB — GLUCOSE, CAPILLARY: Glucose-Capillary: 134 mg/dL — ABNORMAL HIGH (ref 70–99)

## 2010-12-04 MED ORDER — DOCUSATE SODIUM 50 MG/5ML PO LIQD: 100.0000 mg | Freq: Two times a day (BID) | ORAL | Status: DC

## 2010-12-04 MED ORDER — JEVITY 1.2 CAL PO LIQD: 65.0000 mL/h | ORAL | Status: DC

## 2010-12-04 MED ORDER — CLARITHROMYCIN 250 MG/5ML PO SUSR: 500.0000 mg | Freq: Two times a day (BID) | ORAL | Status: AC

## 2010-12-04 MED ORDER — GLYCOPYRROLATE 0.2 MG/ML IJ SOLN: 0.1000 mg | Freq: Three times a day (TID) | INTRAMUSCULAR | Status: DC

## 2010-12-04 MED ORDER — PROMETHAZINE HCL 6.25 MG/5ML PO SYRP
25.0000 mg | ORAL_SOLUTION | Freq: Four times a day (QID) | ORAL | Status: DC | PRN
  Filled 2010-12-04: qty 20

## 2010-12-04 MED ORDER — RE PRENATAL MULTIVITAMIN/IRON 29-1 MG PO CHEW: 1.0000 | CHEWABLE_TABLET | Freq: Every day | ORAL | Status: DC

## 2010-12-04 MED ORDER — AMOXICILLIN 250 MG/5ML PO SUSR: 1000.0000 mg | Freq: Two times a day (BID) | ORAL | Status: AC

## 2010-12-04 MED ORDER — METOCLOPRAMIDE HCL 10 MG PO TABS
10.0000 mg | ORAL_TABLET | Freq: Three times a day (TID) | ORAL | Status: DC
  Administered 2010-12-04: 10 mg via ORAL
  Filled 2010-12-04: qty 1

## 2010-12-04 MED ORDER — PROMETHAZINE HCL 6.25 MG/5ML PO SYRP: 25.0000 mg | ORAL_SOLUTION | Freq: Four times a day (QID) | ORAL | Status: DC | PRN

## 2010-12-04 MED ORDER — LORAZEPAM 1 MG PO TABS
1.0000 mg | ORAL_TABLET | Freq: Once | ORAL | Status: AC
  Administered 2010-12-04: 1 mg via ORAL
  Filled 2010-12-04: qty 1

## 2010-12-04 NOTE — Progress Notes (Signed)
If the  IVF are  turned off and/or pt is  to be discharged home on tube feedings of Jevity 1.2 at 65 ml/hr, that will provide 1263 ml of free water. Additional free water will need to be added through the ng tube ( or PO) to avoid dehydration. Additional 800 ml/ day of  water divided as is comfortable for the patient will be required.  Tube feeding is providing 1872 Kcal/day, 86 g protein

## 2010-12-04 NOTE — Progress Notes (Signed)
Pt tolerating 15ml/ hr tube feeding.  Jevity increased to 46m/hr.

## 2010-12-04 NOTE — Discharge Summary (Addendum)
Physician Discharge Summary  Patient ID: ATZIRY MARKEY MRN: [id number] DOB/AGE: January 06, 1981 30 y.o.  Admit date: 11/30/2010 Discharge date: 12/04/2010  Admission Diagnoses: #1 G4 P2 012 intrauterine pregnancy at 10 weeks and 6 days #2 hyperemesis gravidarum #3 diabetes type 2 #4 hypertension #5 H. pylori infection  Discharge Diagnoses:  #1 G4 P2 012 intrauterine pregnancy at 10 weeks and 6 days #2 hyperemesis gravidarum #3 diabetes type 2 #4 hypertension #5 H. pylori infection  Discharged Condition: good  Hospital Course: Ms. [name] was admitted on 11/13 for hyperemesis gravidarum. She was hospitalized week prior and had been discharged tolerating food and liquid by mouth. Over the weekend she had difficulty maintaining oral intake and began vomiting. Upon admission the patient had an NG tube placed with tube feeds to her increased to 65 mL per hour which she tolerated. The patient will be discharged today to followup at [facility] clinics on Tuesday.   Consults: none  Significant Diagnostic Studies: none  Treatments: NG tube with tube feeds.  Discharge Exam: Blood pressure 116/82, pulse 88, temperature 98.5 F (36.9 C), temperature source Oral, resp. rate 18, height 5\' 7"  (1.702 m), weight 78.671 kg (173 lb 7 oz), last menstrual period 09/19/2010, SpO2 95.00%.  Disposition: Home or Self Care  Discharge Orders    Future Appointments: Provider: Department: Dept Phone: Center:   12/08/2010 8:30 AM [facility] [phone]     Current Discharge Medication List    START taking these medications   Details  amoxicillin (AMOXIL) 250 MG/5ML suspension Take 20 mLs (1,000 mg total) by mouth every 12 (twelve) hours. Qty: 200 mL, Refills: 0    clarithromycin (BIAXIN) 250 MG/5ML suspension Take 10 mLs (500 mg total) by mouth every 12 (twelve) hours. Qty: 200 mL, Refills: 0    docusate (COLACE) 50 MG/5ML liquid Place 10 mLs (100 mg total) into feeding  tube 2 (two) times daily. Qty: 100 mL, Refills: 0      Nutritional Supplements (FEEDING SUPPLEMENT, JEVITY 1.2,) LIQD Place 65 mL/hr into feeding tube continuous. 60mL per hour continuous Qty: 30000 mL, Refills: 0    Prenatal Vit-Fe Fumarate-FA (RE PRENATAL MULTIVITAMIN/IRON) 29-1 MG CHEW Chew 1 tablet by mouth daily. Qty: 30 tablet, Refills: 0    promethazine (PHENERGAN) 6.25 MG/5ML syrup Take 20 mLs (25 mg total) by mouth every 6 (six) hours as needed. Qty: 120 mL, Refills: 0      CONTINUE these medications which have NOT CHANGED   Details  albuterol (PROVENTIL HFA;VENTOLIN HFA) 108 (90 BASE) MCG/ACT inhaler Inhale 2 puffs into the lungs every 6 (six) hours as needed. For wheezing     amLODipine (NORVASC) 10 MG tablet Take 1 tablet (10 mg total) by mouth daily. Qty: 30 tablet, Refills: 2    escitalopram (LEXAPRO) 20 MG tablet Take 1 tablet (20 mg total) by mouth daily. Qty: 30 tablet, Refills: 2    insulin aspart (NOVOLOG) 100 UNIT/ML injection Inject 15 Units into the skin 3 (three) times daily before meals. Qty: 10 mL, Refills: 2    insulin NPH (HUMULIN N,NOVOLIN N) 100 UNIT/ML injection Inject 15 Units into the skin 2 (two) times daily in the am and at bedtime.Otho Darner: 10 mL, Refills: 2    pantoprazole (PROTONIX) 40 MG tablet Take 1 tablet (40 mg total) by mouth daily at 12 noon. Qty: 60 tablet, Refills: 0      STOP taking these medications     amoxicillin (AMOXIL) 500 MG capsule  clarithromycin (BIAXIN) 500 MG tablet      docusate sodium 100 MG CAPS      methylPREDNISolone (MEDROL) 8 MG tablet      prenatal vitamin w/FE, FA (PRENATAL 1 + 1) 27-1 MG TABS        Follow-up Information    Follow up with [facility] on 12/08/2010.   Contact information:   [place] [phone]          Signed: Loma Boston JEHIEL 12/04/2010, 3:58 PM

## 2010-12-04 NOTE — Progress Notes (Signed)
  No emesis since yesterday. She wants to try to eat. Filed Vitals:   12/03/10 1200 12/03/10 1800 12/03/10 2145 12/04/10 0500  BP: 131/89 107/72 106/74 129/83  Pulse: 112 100 107 101  Temp: 98.3 F (36.8 C) 98.5 F (36.9 C) 98.4 F (36.9 C) 98.4 F (36.9 C)  TempSrc: Oral Oral Oral Oral  Resp: 20 20 20 18   Height:      Weight:    78.671 kg (173 lb 7 oz)  SpO2: 100% 100% 100% 99%   NAD, pleasant  CBG (last 3)   Basename 12/04/10 0359 12/04/10 0007 12/03/10 2006  GLUCAP 158* 134* 105*    [redacted]w[redacted]d Hyperemesis gravidarum, improved, tube feeding 65 ml/hr Type 2 DM, Class  Plan  Try po feeding per her request.  Malie Kashani

## 2010-12-04 NOTE — Progress Notes (Signed)
Jevity increased to 72ml/hr.

## 2010-12-07 ENCOUNTER — Inpatient Hospital Stay (HOSPITAL_COMMUNITY): Payer: Medicaid Other

## 2010-12-07 ENCOUNTER — Inpatient Hospital Stay (HOSPITAL_COMMUNITY)
Admission: AD | Admit: 2010-12-07 | Discharge: 2010-12-07 | Disposition: A | Discharge status: Home / Self Care | Payer: Medicaid Other | Source: Ambulatory Visit | Attending: Obstetrics & Gynecology | Admitting: Obstetrics & Gynecology

## 2010-12-07 ENCOUNTER — Encounter (HOSPITAL_COMMUNITY): Payer: Self-pay

## 2010-12-07 DIAGNOSIS — O10019 Pre-existing essential hypertension complicating pregnancy, unspecified trimester: Secondary | ICD-10-CM

## 2010-12-07 DIAGNOSIS — O10919 Unspecified pre-existing hypertension complicating pregnancy, unspecified trimester: Secondary | ICD-10-CM

## 2010-12-07 DIAGNOSIS — O210 Mild hyperemesis gravidarum: Secondary | ICD-10-CM | POA: Insufficient documentation

## 2010-12-07 DIAGNOSIS — E119 Type 2 diabetes mellitus without complications: Secondary | ICD-10-CM

## 2010-12-07 LAB — URINE MICROSCOPIC-ADD ON

## 2010-12-07 LAB — GLUCOSE, CAPILLARY: Glucose-Capillary: 139 mg/dL — ABNORMAL HIGH (ref 70–99)

## 2010-12-07 LAB — URINALYSIS, ROUTINE W REFLEX MICROSCOPIC
Glucose, UA: NEGATIVE mg/dL
Hgb urine dipstick: NEGATIVE
Leukocytes, UA: NEGATIVE
Specific Gravity, Urine: 1.025 (ref 1.005–1.030)
pH: 7.5 (ref 5.0–8.0)

## 2010-12-07 MED ORDER — ONDANSETRON 8 MG PO TBDP
8.0000 mg | ORAL_TABLET | Freq: Once | ORAL | Status: AC
  Administered 2010-12-07: 8 mg via ORAL
  Filled 2010-12-07: qty 1

## 2010-12-07 MED ORDER — BENZOCAINE (TOPICAL) 20 % EX AERO
INHALATION_SPRAY | Freq: Once | CUTANEOUS | Status: DC
  Filled 2010-12-07: qty 57

## 2010-12-07 MED ORDER — BUTAMBEN-TETRACAINE-BENZOCAINE 2-2-14 % EX AERO
1.0000 | INHALATION_SPRAY | Freq: Once | CUTANEOUS | Status: DC
Start: 2010-12-07 — End: 2010-12-07

## 2010-12-07 NOTE — Progress Notes (Signed)
Patient state she was just discharged from the hospital on Friday with a feeding tube. Started vomiting again today and the tube came out. Patient arrived by EMS.

## 2010-12-07 NOTE — ED Provider Notes (Addendum)
History     Chief Complaint  Patient presents with  . Emesis During Pregnancy   HPI [name] 30 y.o. female  [phone] at [redacted]w[redacted]d presenting with emesis and loss of NG tube. Patient reports she was doing well after discharge. She had not vomited at all until today. She even ate a decent size meal yesterday evening. She has been tolerating PO fluids. She threw up once at 1pm then again at 4pm. At 4pm, she lost her NG tube when vomiting. She threw up once in the ride to the hospital as well. She did not fill any home medications including phenergan, but reports can fill tomorrow.   Patient adamantly requesting antianxiety medication more so than anti emetics.    OB History    Grav Para Term Preterm Abortions TAB SAB Ect Mult Living   4 2 2  1   1  2       Past Medical History  Diagnosis Date  . Asthma   . Diabetes mellitus   . Mental disorder   . Depression     History reviewed. No pertinent past surgical history.  No family history on file.  History  Substance Use Topics  . Smoking status: Never Smoker   . Smokeless tobacco: Never Used  . Alcohol Use: Yes    Allergies:  Allergies  Allergen Reactions  . Morphine And Related Nausea And Vomiting    Prescriptions prior to admission  Medication Sig Dispense Refill  . albuterol (PROVENTIL HFA;VENTOLIN HFA) 108 (90 BASE) MCG/ACT inhaler Inhale 2 puffs into the lungs every 6 (six) hours as needed. For wheezing       . amLODipine (NORVASC) 10 MG tablet Take 1 tablet (10 mg total) by mouth daily.  30 tablet  2  . amoxicillin (AMOXIL) 250 MG/5ML suspension Take 20 mLs (1,000 mg total) by mouth every 12 (twelve) hours.  200 mL  0  . clarithromycin (BIAXIN) 250 MG/5ML suspension Take 10 mLs (500 mg total) by mouth every 12 (twelve) hours.  200 mL  0  . docusate (COLACE) 50 MG/5ML liquid Place 10 mLs (100 mg total) into feeding tube 2 (two) times daily.  100 mL  0  . escitalopram (LEXAPRO) 20 MG tablet Take 1 tablet (20 mg total)  by mouth daily.  30 tablet  2  . insulin aspart (NOVOLOG) 100 UNIT/ML injection Inject 15 Units into the skin 3 (three) times daily before meals.  10 mL  2  . insulin NPH (HUMULIN N,NOVOLIN N) 100 UNIT/ML injection Inject 15 Units into the skin 2 (two) times daily in the am and at bedtime..  10 mL  2  . Nutritional Supplements (FEEDING SUPPLEMENT, JEVITY 1.2,) LIQD Place 65 mL/hr into feeding tube continuous. 64mL per hour continuous  30000 mL  0  . pantoprazole (PROTONIX) 40 MG tablet Take 1 tablet (40 mg total) by mouth daily at 12 noon.  60 tablet  0  . Prenatal Vit-Fe Fumarate-FA (RE PRENATAL MULTIVITAMIN/IRON) 29-1 MG CHEW Chew 1 tablet by mouth daily.  30 tablet  0  . promethazine (PHENERGAN) 6.25 MG/5ML syrup Take 20 mLs (25 mg total) by mouth every 6 (six) hours as needed.  120 mL  0    ROS Physical Exam   Blood pressure 122/81, pulse 110, temperature 98.1 F (36.7 C), temperature source Oral, resp. rate 20, last menstrual period 09/19/2010, SpO2 97.00%.  Physical Exam  Constitutional: She appears well-developed and well-nourished. She appears distressed.  Patient rocking back in forth in bed constantly. Can stop to talk about a topic she is interested in.   HENT:  Mouth/Throat: Oropharynx is clear and moist.       Patient very tearful.   Cardiovascular: Normal rate and regular rhythm.  Exam reveals no gallop and no friction rub.   No murmur heard. Respiratory: Effort normal and breath sounds normal. No respiratory distress. She has no wheezes. She has no rales.  GI: Soft. Bowel sounds are normal. She exhibits no distension. There is no tenderness.  Skin:       Capillary refill <2 seconds.     MAU Course  Procedures  MDM Will give 8mg  zofran odt. Replace NG. CXR to confirm placement. D/c home as has f/u tomorrow.   Patient is NOT dehydrated by clinical exam. Patient appears more anxious than nauseous. Benzodiazepine seeking behavior-states treated throughout  hospitalization with ativan but this is not consistent with medical record.   NG placed but not placed far enough. Guide wire discarded. Had to repeat NG tube placement.   Assessment and Plan  #1 G4 P2 012 intrauterine pregnancy at [redacted]w[redacted]d #2 hyperemesis gravidarum  #3 diabetes type 2  #4 hypertension  #5 H. pylori infection #6 depression #7 anxiety  Plan to Discharge home once NG arranged. Follow up tomorrow in clinic. Encouraged patient to fill medications. Strong anxiety component with benzo seeking behavior at play. Will not reinforce behavior with benzos.   Will restart tube feeds at 20 mL/hour. To be titrated up in clinic.   Case discussed with Hansel Feinstein, [place], STEPHEN 12/07/2010, 6:43 PM   Assumed care of pt at 2000.  Proper panda tube placement verified by X-ray D/C home Fill Rx F/U as scheduled in [facility] tomorrow  [place], [place] 12/07/2010 9:07 PM

## 2010-12-07 NOTE — ED Notes (Signed)
Hal Morales RN into replace feeding tube as ordered.

## 2010-12-08 ENCOUNTER — Inpatient Hospital Stay (HOSPITAL_COMMUNITY)
Admission: AD | Admit: 2010-12-08 | Discharge: 2010-12-08 | Disposition: A | Discharge status: Home / Self Care | Payer: Medicaid Other | Source: Ambulatory Visit | Attending: Obstetrics & Gynecology | Admitting: Obstetrics & Gynecology

## 2010-12-08 ENCOUNTER — Inpatient Hospital Stay (HOSPITAL_COMMUNITY): Payer: Medicaid Other

## 2010-12-08 ENCOUNTER — Encounter (HOSPITAL_COMMUNITY): Payer: Self-pay | Admitting: *Deleted

## 2010-12-08 ENCOUNTER — Ambulatory Visit: Payer: Self-pay

## 2010-12-08 DIAGNOSIS — O210 Mild hyperemesis gravidarum: Secondary | ICD-10-CM | POA: Insufficient documentation

## 2010-12-08 DIAGNOSIS — O10019 Pre-existing essential hypertension complicating pregnancy, unspecified trimester: Secondary | ICD-10-CM | POA: Insufficient documentation

## 2010-12-08 DIAGNOSIS — O24919 Unspecified diabetes mellitus in pregnancy, unspecified trimester: Secondary | ICD-10-CM | POA: Insufficient documentation

## 2010-12-08 DIAGNOSIS — O211 Hyperemesis gravidarum with metabolic disturbance: Secondary | ICD-10-CM

## 2010-12-08 DIAGNOSIS — E119 Type 2 diabetes mellitus without complications: Secondary | ICD-10-CM | POA: Insufficient documentation

## 2010-12-08 DIAGNOSIS — A048 Other specified bacterial intestinal infections: Secondary | ICD-10-CM

## 2010-12-08 MED ORDER — METOCLOPRAMIDE HCL 10 MG PO TABS: 10.0000 mg | ORAL_TABLET | Freq: Four times a day (QID) | ORAL | Status: DC

## 2010-12-08 MED ORDER — PROMETHAZINE HCL 25 MG/ML IJ SOLN
25.0000 mg | Freq: Once | INTRAMUSCULAR | Status: AC
  Administered 2010-12-08: 25 mg via INTRAMUSCULAR
  Filled 2010-12-08: qty 1

## 2010-12-08 MED ORDER — PROMETHAZINE HCL 6.25 MG/5ML PO SYRP: 25.0000 mg | ORAL_SOLUTION | Freq: Four times a day (QID) | ORAL | Status: DC | PRN

## 2010-12-08 NOTE — Progress Notes (Signed)
No vaginal discharge.  Pt was given education booklet and WIC info.

## 2010-12-08 NOTE — ED Provider Notes (Signed)
Chief Complaint:  Morning Sickness   [name] is  30 y.o. [phone].  Patient's last menstrual period was 09/19/2010..  [redacted]w[redacted]d   She presents complaining of Morning Sickness Pt was in clinic this morning for OB intake and vomited NG out. Reports being in MAU last night for NG replacement for the same reason. States is not taking medications that she was discharged with because she does not have MCD card and cannot afford to pay out of pocket.   Obstetrical/Gynecological History: OB History    Grav Para Term Preterm Abortions TAB SAB Ect Mult Living   4 2 1 1 1   1  2       Past Medical History: Past Medical History  Diagnosis Date  . Asthma   . Diabetes mellitus   . Mental disorder   . Depression     Past Surgical History: History reviewed. No pertinent past surgical history.  Family History: Family History  Problem Relation Age of Onset  . Heart disease Mother     CHF    Social History: History  Substance Use Topics  . Smoking status: Never Smoker   . Smokeless tobacco: Never Used  . Alcohol Use: Yes    Allergies:  Allergies  Allergen Reactions  . Morphine And Related Nausea And Vomiting    No prescriptions prior to admission    Review of Systems - Negative except what has been reviewed in the HPI  Physical Exam   Blood pressure 108/62, pulse 96, temperature 98.1 F (36.7 C), temperature source Oral, resp. rate 18, weight 78.109 kg (172 lb 3.2 oz), last menstrual period 09/19/2010, SpO2 98.00%.  General: General appearance - alert, well appearing, and in no distress, oriented to person, place, and time and ill-appearing Mental status - alert, oriented to person, place, and time, depressed mood Abdomen - soft, nontender, nondistended, no masses or organomegaly Focused Gynecological Exam: examination not indicated  Labs: Recent Results (from the past 24 hour(s))  URINALYSIS, ROUTINE W REFLEX MICROSCOPIC   Collection Time   12/07/10  5:30 PM   Component Value Range   Color, Urine YELLOW  YELLOW    Appearance CLEAR  CLEAR    Specific Gravity, Urine 1.025  1.005 - 1.030    pH 7.5  5.0 - 8.0    Glucose, UA NEGATIVE  NEGATIVE (mg/dL)   Hgb urine dipstick NEGATIVE  NEGATIVE    Bilirubin Urine NEGATIVE  NEGATIVE    Ketones, ur 15 (*) NEGATIVE (mg/dL)   Protein, ur 100 (*) NEGATIVE (mg/dL)   Urobilinogen, UA 1.0  0.0 - 1.0 (mg/dL)   Nitrite NEGATIVE  NEGATIVE    Leukocytes, UA NEGATIVE  NEGATIVE   URINE MICROSCOPIC-ADD ON   Collection Time   12/07/10  5:30 PM      Component Value Range   Squamous Epithelial / LPF FEW (*) RARE    WBC, UA 0-2  <3 (WBC/hpf)   RBC / HPF 0-2  <3 (RBC/hpf)   Bacteria, UA FEW (*) RARE   GLUCOSE, CAPILLARY   Collection Time   12/07/10  6:42 PM      Component Value Range   Glucose-Capillary 139 (*) 70 - 99 (mg/dL)   Imaging Studies:  Dg Chest 1 View  12/08/2010  *RADIOLOGY REPORT*  Clinical Data: Feeding tube placement; diabetes; hyperemesis gravida  CHEST - 1 VIEW  Comparison: December 07, 2010  Findings: The feeding tube tip overlies the expected region of the gastric antrum. The cardiac silhouette, mediastinum, pulmonary  vasculature are within normal limits.  Both lungs are clear. There is no acute bony abnormality.  IMPRESSION: Feeding tube tip at gastric antrum level.  There is no evidence of acute cardiac or pulmonary process.  Original Report Authenticated By: Duayne Cal, M.D.   ED Course: Pt medicated with IM phenergan and NG tube replaced without difficulty. Care management able to verify pt MCD and supply patient with MCD information to get medications filled at pharmacy.  Assessment: Hyper emesis, H Pylori, Noncompliance Patient Active Problem List  Diagnoses  . Type 2 diabetes mellitus  . Hyperemesis gravidarum before end of [redacted] week gestation, dehydration  . Chronic hypertension in pregnancy  . Depression  . Asthma  . H. pylori infection    Plan: Discharge home Continue home  health as scheduled Rx phenergan and reglan printed and given to pt FU in [facility] 12/14/10 @ 8:45am  [place] E. 12/08/2010,2:36 PM

## 2010-12-08 NOTE — Progress Notes (Signed)
Threw up and feeding tube came out again.  Does not have medicaid to get medications.  Was at clinic appt when it happened - they brought her up here.

## 2010-12-09 LAB — HIV ANTIBODY (ROUTINE TESTING W REFLEX): HIV: NONREACTIVE

## 2010-12-11 LAB — HEMOGLOBINOPATHY EVALUATION
Hemoglobin Other: 0 %
Hgb F Quant: 0 % (ref 0.0–2.0)
Hgb S Quant: 0 %

## 2010-12-11 LAB — OBSTETRIC PANEL
Antibody Screen: NEGATIVE
Basophils Absolute: 0 10*3/uL (ref 0.0–0.1)
Basophils Relative: 0 % (ref 0–1)
HCT: 30.5 % — ABNORMAL LOW (ref 36.0–46.0)
Hepatitis B Surface Ag: NEGATIVE
Lymphocytes Relative: 34 % (ref 12–46)
MCHC: 33.8 g/dL (ref 30.0–36.0)
Monocytes Absolute: 0.4 10*3/uL (ref 0.1–1.0)
Neutro Abs: 3.5 10*3/uL (ref 1.7–7.7)
Neutrophils Relative %: 55 % (ref 43–77)
Platelets: 283 10*3/uL (ref 150–400)
RDW: 14.1 % (ref 11.5–15.5)
Rubella: 5.5 IU/mL — ABNORMAL HIGH
WBC: 6.2 10*3/uL (ref 4.0–10.5)

## 2010-12-14 ENCOUNTER — Encounter: Payer: Self-pay | Admitting: Family Medicine

## 2010-12-14 ENCOUNTER — Ambulatory Visit (INDEPENDENT_AMBULATORY_CARE_PROVIDER_SITE_OTHER): Payer: Medicaid Other | Admitting: Family Medicine

## 2010-12-14 ENCOUNTER — Encounter: Payer: Medicaid Other | Attending: Obstetrics and Gynecology | Admitting: Dietician

## 2010-12-14 ENCOUNTER — Inpatient Hospital Stay (HOSPITAL_COMMUNITY)
Admission: AD | Admit: 2010-12-14 | Discharge: 2010-12-14 | Disposition: A | Discharge status: Home / Self Care | Payer: Medicaid Other | Source: Ambulatory Visit | Attending: Obstetrics & Gynecology | Admitting: Obstetrics & Gynecology

## 2010-12-14 DIAGNOSIS — O99810 Abnormal glucose complicating pregnancy: Secondary | ICD-10-CM | POA: Insufficient documentation

## 2010-12-14 DIAGNOSIS — O0990 Supervision of high risk pregnancy, unspecified, unspecified trimester: Secondary | ICD-10-CM

## 2010-12-14 DIAGNOSIS — O210 Mild hyperemesis gravidarum: Secondary | ICD-10-CM | POA: Insufficient documentation

## 2010-12-14 DIAGNOSIS — Z713 Dietary counseling and surveillance: Secondary | ICD-10-CM | POA: Insufficient documentation

## 2010-12-14 DIAGNOSIS — O211 Hyperemesis gravidarum with metabolic disturbance: Secondary | ICD-10-CM

## 2010-12-14 DIAGNOSIS — E119 Type 2 diabetes mellitus without complications: Secondary | ICD-10-CM

## 2010-12-14 LAB — POCT URINALYSIS DIP (DEVICE)
Bilirubin Urine: NEGATIVE
Glucose, UA: NEGATIVE mg/dL
Ketones, ur: NEGATIVE mg/dL
Leukocytes, UA: NEGATIVE
Nitrite: NEGATIVE
pH: 7 (ref 5.0–8.0)

## 2010-12-14 MED ORDER — GLUCOSE BLOOD VI STRP: ORAL_STRIP | Status: DC

## 2010-12-14 MED ORDER — PROMETHAZINE HCL 6.25 MG/5ML PO SYRP: 25.0000 mg | ORAL_SOLUTION | Freq: Four times a day (QID) | ORAL | Status: DC | PRN

## 2010-12-14 MED ORDER — PROMETHAZINE HCL 6.25 MG/5ML PO SYRP
25.0000 mg | ORAL_SOLUTION | Freq: Four times a day (QID) | ORAL | Status: DC | PRN
  Administered 2010-12-14: 25 mg via ORAL
  Filled 2010-12-14: qty 20

## 2010-12-14 MED ORDER — ACCU-CHEK FASTCLIX LANCETS MISC: 1.0000 [IU] | Freq: Four times a day (QID) | Status: DC

## 2010-12-14 NOTE — Patient Instructions (Addendum)
Pregnancy - Second Trimester The second trimester of pregnancy (3 to 6 months) is a period of rapid growth for you and your baby. At the end of the sixth month, your baby is about 9 inches long and weighs 1 1/2 pounds. You will begin to feel the baby move between 18 and 20 weeks of the pregnancy. This is called quickening. Weight gain is faster. A clear fluid (colostrum) may leak out of your breasts. You may feel small contractions of the womb (uterus). This is known as false labor or Braxton-Hicks contractions. This is like a practice for labor when the baby is ready to be born. Usually, the problems with morning sickness have usually passed by the end of your first trimester. Some women develop small dark blotches (called cholasma, mask of pregnancy) on their face that usually goes away after the baby is born. Exposure to the sun makes the blotches worse. Acne may also develop in some pregnant women and pregnant women who have acne, may find that it goes away. PRENATAL EXAMS  Blood work may continue to be done during prenatal exams. These tests are done to check on your health and the probable health of your baby. Blood work is used to follow your blood levels (hemoglobin). Anemia (low hemoglobin) is common during pregnancy. Iron and vitamins are given to help prevent this. You will also be checked for diabetes between 24 and 28 weeks of the pregnancy. Some of the previous blood tests may be repeated.   The size of the uterus is measured during each visit. This is to make sure that the baby is continuing to grow properly according to the dates of the pregnancy.   Your blood pressure is checked every prenatal visit. This is to make sure you are not getting toxemia.   Your urine is checked to make sure you do not have an infection, diabetes or protein in the urine.   Your weight is checked often to make sure gains are happening at the suggested rate. This is to ensure that both you and your baby are  growing normally.   Sometimes, an ultrasound is performed to confirm the proper growth and development of the baby. This is a test which bounces harmless sound waves off the baby so your caregiver can more accurately determine due dates.  Sometimes, a specialized test is done on the amniotic fluid surrounding the baby. This test is called an amniocentesis. The amniotic fluid is obtained by sticking a needle into the belly (abdomen). This is done to check the chromosomes in instances where there is a concern about possible genetic problems with the baby. It is also sometimes done near the end of pregnancy if an early delivery is required. In this case, it is done to help make sure the baby's lungs are mature enough for the baby to live outside of the womb. CHANGES OCCURING IN THE SECOND TRIMESTER OF PREGNANCY Your body goes through many changes during pregnancy. They vary from person to person. Talk to your caregiver about changes you notice that you are concerned about.  During the second trimester, you will likely have an increase in your appetite. It is normal to have cravings for certain foods. This varies from person to person and pregnancy to pregnancy.   Your lower abdomen will begin to bulge.   You may have to urinate more often because the uterus and baby are pressing on your bladder. It is also common to get more bladder infections during pregnancy (  pain with urination). You can help this by drinking lots of fluids and emptying your bladder before and after intercourse.   You may begin to get stretch marks on your hips, abdomen, and breasts. These are normal changes in the body during pregnancy. There are no exercises or medications to take that prevent this change.   You may begin to develop swollen and bulging veins (varicose veins) in your legs. Wearing support hose, elevating your feet for 15 minutes, 3 to 4 times a day and limiting salt in your diet helps lessen the problem.    Heartburn may develop as the uterus grows and pushes up against the stomach. Antacids recommended by your caregiver helps with this problem. Also, eating smaller meals 4 to 5 times a day helps.   Constipation can be treated with a stool softener or adding bulk to your diet. Drinking lots of fluids, vegetables, fruits, and whole grains are helpful.   Exercising is also helpful. If you have been very active up until your pregnancy, most of these activities can be continued during your pregnancy. If you have been less active, it is helpful to start an exercise program such as walking.   Hemorrhoids (varicose veins in the rectum) may develop at the end of the second trimester. Warm sitz baths and hemorrhoid cream recommended by your caregiver helps hemorrhoid problems.   Backaches may develop during this time of your pregnancy. Avoid heavy lifting, wear low heal shoes and practice good posture to help with backache problems.   Some pregnant women develop tingling and numbness of their hand and fingers because of swelling and tightening of ligaments in the wrist (carpel tunnel syndrome). This goes away after the baby is born.   As your breasts enlarge, you may have to get a bigger bra. Get a comfortable, cotton, support bra. Do not get a nursing bra until the last month of the pregnancy if you will be nursing the baby.   You may get a dark line from your belly button to the pubic area called the linea nigra.   You may develop rosy cheeks because of increase blood flow to the face.   You may develop spider looking lines of the face, neck, arms and chest. These go away after the baby is born.  HOME CARE INSTRUCTIONS   It is extremely important to avoid all smoking, herbs, alcohol, and unprescribed drugs during your pregnancy. These chemicals affect the formation and growth of the baby. Avoid these chemicals throughout the pregnancy to ensure the delivery of a healthy infant.   Most of your home  care instructions are the same as suggested for the first trimester of your pregnancy. Keep your caregiver's appointments. Follow your caregiver's instructions regarding medication use, exercise and diet.   During pregnancy, you are providing food for you and your baby. Continue to eat regular, well-balanced meals. Choose foods such as meat, fish, milk and other low fat dairy products, vegetables, fruits, and whole-grain breads and cereals. Your caregiver will tell you of the ideal weight gain.   A physical sexual relationship may be continued up until near the end of pregnancy if there are no other problems. Problems could include early (premature) leaking of amniotic fluid from the membranes, vaginal bleeding, abdominal pain, or other medical or pregnancy problems.   Exercise regularly if there are no restrictions. Check with your caregiver if you are unsure of the safety of some of your exercises. The greatest weight gain will occur in the   last 2 trimesters of pregnancy. Exercise will help you:   Control your weight.   Get you in shape for labor and delivery.   Lose weight after you have the baby.   Wear a good support or jogging bra for breast tenderness during pregnancy. This may help if worn during sleep. Pads or tissues may be used in the bra if you are leaking colostrum.   Do not use hot tubs, steam rooms or saunas throughout the pregnancy.   Wear your seat belt at all times when driving. This protects you and your baby if you are in an accident.   Avoid raw meat, uncooked cheese, cat litter boxes and soil used by cats. These carry germs that can cause birth defects in the baby.   The second trimester is also a good time to visit your dentist for your dental health if this has not been done yet. Getting your teeth cleaned is OK. Use a soft toothbrush. Brush gently during pregnancy.   It is easier to loose urine during pregnancy. Tightening up and strengthening the pelvic muscles will  help with this problem. Practice stopping your urination while you are going to the bathroom. These are the same muscles you need to strengthen. It is also the muscles you would use as if you were trying to stop from passing gas. You can practice tightening these muscles up 10 times a set and repeating this about 3 times per day. Once you know what muscles to tighten up, do not perform these exercises during urination. It is more likely to contribute to an infection by backing up the urine.   Ask for help if you have financial, counseling or nutritional needs during pregnancy. Your caregiver will be able to offer counseling for these needs as well as refer you for other special needs.   Your skin may become oily. If so, wash your face with mild soap, use non-greasy moisturizer and oil or cream based makeup.  MEDICATIONS AND DRUG USE IN PREGNANCY  Take prenatal vitamins as directed. The vitamin should contain 1 milligram of folic acid. Keep all vitamins out of reach of children. Only a couple vitamins or tablets containing iron may be fatal to a baby or young child when ingested.   Avoid use of all medications, including herbs, over-the-counter medications, not prescribed or suggested by your caregiver. Only take over-the-counter or prescription medicines for pain, discomfort, or fever as directed by your caregiver. Do not use aspirin.   Let your caregiver also know about herbs you may be using.   Alcohol is related to a number of birth defects. This includes fetal alcohol syndrome. All alcohol, in any form, should be avoided completely. Smoking will cause low birth rate and premature babies.   Street or illegal drugs are very harmful to the baby. They are absolutely forbidden. A baby born to an addicted mother will be addicted at birth. The baby will go through the same withdrawal an adult does.  SEEK MEDICAL CARE IF:  You have any concerns or worries during your pregnancy. It is better to call with  your questions if you feel they cannot wait, rather than worry about them. SEEK IMMEDIATE MEDICAL CARE IF:   An unexplained oral temperature above 102 F (38.9 C) develops, or as your caregiver suggests.   You have leaking of fluid from the vagina (birth canal). If leaking membranes are suspected, take your temperature and tell your caregiver of this when you call.   There   is vaginal spotting, bleeding, or passing clots. Tell your caregiver of the amount and how many pads are used. Light spotting in pregnancy is common, especially following intercourse.   You develop a bad smelling vaginal discharge with a change in the color from clear to white.   You continue to feel sick to your stomach (nauseated) and have no relief from remedies suggested. You vomit blood or coffee ground-like materials.   You lose more than 2 pounds of weight or gain more than 2 pounds of weight over 1 week, or as suggested by your caregiver.   You notice swelling of your face, hands, feet, or legs.   You get exposed to [place] measles and have never had them.   You are exposed to fifth disease or chickenpox.   You develop belly (abdominal) pain. Round ligament discomfort is a common non-cancerous (benign) cause of abdominal pain in pregnancy. Your caregiver still must evaluate you.   You develop a bad headache that does not go away.   You develop fever, diarrhea, pain with urination, or shortness of breath.   You develop visual problems, blurry, or double vision.   You fall or are in a car accident or any kind of trauma.   There is mental or physical violence at home.  Document Released: 12/29/2000 Document Revised: 09/16/2010 Document Reviewed: 07/03/2008 [facility] Patient Information 2012 [place]. Hyperemesis Gravidarum Hyperemesis gravidarum is a severe form of nausea and vomiting that happens during pregnancy. Hyperemesis is worse than morning sickness. It may cause a woman to have nausea or  vomiting all day for many days. It may keep a woman from eating and drinking enough food and liquids. Hyperemesis usually occurs during the first half (the first 20 weeks) of pregnancy. It often goes away once a woman is in her second half of pregnancy. However, sometimes hyperemesis continues through an entire pregnancy.  CAUSES  The cause of this condition is not completely known but is thought to be due to changes in the body's hormones when pregnant. It could be the high level of the pregnancy hormone or an increase in estrogen in the body.  SYMPTOMS   Severe nausea and vomiting.   Nausea that does not go away.   Vomiting that does not allow you to keep any food down.   Weight loss and body fluid loss (dehydration).   Having no desire to eat or not liking food you have previously enjoyed.  DIAGNOSIS  Your caregiver may ask you about your symptoms. Your caregiver may also order blood tests and urine tests to make sure something else is not causing the problem.  TREATMENT  You may only need medicine to control the problem. If medicines do not control the nausea and vomiting, you will be treated in the hospital to prevent dehydration, acidosis, weight loss, and changes in the electrolytes in your body that may harm the unborn baby (fetus). You may need intravenous (IV) fluids.  HOME CARE INSTRUCTIONS   Take all medicine as directed by your caregiver.   Try eating a couple of dry crackers or toast in the morning before getting out of bed.   Avoid foods and smells that upset your stomach.   Avoid fatty and spicy foods. Eat 5 to 6 small meals a day.   Do not drink when eating meals. Drink between meals.   For snacks, eat high protein foods, such as cheese. Eat or suck on things that have ginger in them. Ginger helps nausea.  Avoid food preparation. The smell of food can spoil your appetite.   Avoid iron pills and iron in your multivitamins until after 3 to 4 months of being pregnant.   SEEK MEDICAL CARE IF:   Your abdominal pain increases since the last time you saw your caregiver.   You have a severe headache.   You develop vision problems.   You feel you are losing weight.  SEEK IMMEDIATE MEDICAL CARE IF:   You are unable to keep fluids down.   You vomit blood.   You have constant nausea and vomiting.   You have a fever.   You have excessive weakness, dizziness, fainting, or extreme thirst.  MAKE SURE YOU:   Understand these instructions.   Will watch your condition.   Will get help right away if you are not doing well or get worse.  Document Released: 01/04/2005 Document Revised: 09/16/2010 Document Reviewed: 04/06/2010 [facility] Patient Information 2012 [place]. Gestational Diabetes Mellitus Gestational diabetes mellitus (GDM) is diabetes that occurs only during pregnancy. This happens when the body cannot properly handle the glucose (sugar) that increases in the blood after eating. During pregnancy, insulin resistance (reduced sensitivity to insulin) occurs because of the release of hormones from the placenta. Usually, the pancreas of pregnant women produces enough insulin to overcome the resistance that occurs. However, in gestational diabetes, the insulin is there but it does not work effectively. If the resistance is severe enough that the pancreas does not produce enough insulin, extra glucose builds up in the blood.  WHO IS AT RISK FOR DEVELOPING GESTATIONAL DIABETES?  Women with a history of diabetes in the family.   Women over age 85.   Women who are overweight.   Women in certain ethnic groups (Hispanic, African American, Native American, [place] and [place]).  WHAT CAN HAPPEN TO THE BABY? If the mother's blood glucose is too high while she is pregnant, the extra sugar will travel through the umbilical cord to the baby. Some of the problems the baby may have are:  Large Baby - If the baby receives too much sugar, the baby will  gain more weight. This may cause the baby to be too large to be born normally (vaginally) and a Cesarean section (C-section) may be needed.   Low Blood Glucose (hypoglycemia) - The baby makes extra insulin, in response to the extra sugar its gets from its mother. When the baby is born and no longer needs this extra insulin, the baby's blood glucose level may drop.   Jaundice (yellow coloring of the skin and eyes) - This is fairly common in babies. It is caused from a build-up of the chemical called bilirubin. This is rarely serious, but is seen more often in babies whose mothers had gestational diabetes.  RISKS TO THE MOTHER Women who have had gestational diabetes may be at higher risk for some problems, including:  Preeclampsia or toxemia, which includes problems with high blood pressure. Blood pressure and protein levels in the urine must be checked frequently.   Infections.   Cesarean section (C-section) for delivery.   Developing Type 2 diabetes later in life. About 30-50% will develop diabetes later, especially if obese.  DIAGNOSIS  The hormones that cause insulin resistance are highest at about 24-28 weeks of pregnancy. If symptoms are experienced, they are much like symptoms you would normally expect during pregnancy.  GDM is often diagnosed using a two part method: 1. After 24-28 weeks of pregnancy, the woman drinks a glucose  solution and takes a blood test. If the glucose level is high, a second test will be given.  2. Oral Glucose Tolerance Test (OGTT) which is 3 hours long - After not eating overnight, the blood glucose is checked. The woman drinks a glucose solution, and hourly blood glucose tests are taken.  If the woman has risk factors for GDM, the caregiver may test earlier than 24 weeks of pregnancy. TREATMENT  Treatment of GDM is directed at keeping the mother's blood glucose level normal, and may include:  Meal planning.   Taking insulin or other medicine to control your  blood glucose level.   Exercise.   Keeping a daily record of the foods you eat.   Blood glucose monitoring and keeping a record of your blood glucose levels.   May monitor ketone levels in the urine, although this is no longer considered necessary in most pregnancies.  HOME CARE INSTRUCTIONS  While you are pregnant:  Follow your caregiver's advice regarding your prenatal appointments, meal planning, exercise, medicines, vitamins, blood and other tests, and physical activities.   Keep a record of your meals, blood glucose tests, and the amount of insulin you are taking (if any). Show this to your caregiver at every prenatal visit.   If you have GDM, you may have problems with hypoglycemia (low blood glucose). You may suspect this if you become suddenly dizzy, feel shaky, and/or weak. If you think this is happening and you have a glucose meter, try to test your blood glucose level. Follow your caregiver's advice for when and how to treat your low blood glucose. Generally, the 15:15 rule is followed: Treat by consuming 15 grams of carbohydrates, wait 15 minutes, and recheck blood glucose. Examples of 15 grams of carbohydrates are:   1 cup skim or low-fat milk.    cup juice.   3-4 glucose tablets.   5-6 hard candies.   1 small box raisins.    cup regular soda pop.   Practice good hygiene, to avoid infections.   Do not smoke.  SEEK MEDICAL CARE IF:   You develop abnormal vaginal discharge, with or without itching.   You become weak and tired more than expected.   You seem to sweat a lot.   You have a sudden increase in weight, 5 pounds or more in one week.   You are losing weight, 3 pounds or more in a week.   Your blood glucose level is high, and you need instructions on what to do about it.  SEEK IMMEDIATE MEDICAL CARE IF:   You develop a severe headache.   You faint or pass out.   You develop nausea and vomiting.   You become disoriented or confused.   You have  a convulsion.   You develop vision problems.   You develop stomach pain.   You develop vaginal bleeding.   You develop uterine contractions.   You have leaking or a gush of fluid from the vagina.  AFTER YOU HAVE THE BABY:  Go to all of your follow-up appointments, and have blood tests as advised by your caregiver.   Maintain a healthy lifestyle, to prevent diabetes in the future. This includes:   Following a healthy meal plan.   Controlling your weight.   Getting enough exercise and proper rest.   Do not smoke.   Breastfeed your baby if you can. This will lower the chance of you and your baby developing diabetes later in life.  For more information about  diabetes, go to the American Diabetes Association at: NiceStrategy.no. For more information about gestational diabetes, go to the [employer] of [employer] at: RepublicForum.gl. Document Released: 04/12/2000 Document Revised: 09/16/2010 Document Reviewed: 11/04/2008 [facility] Patient Information 2012 [place], [place].

## 2010-12-14 NOTE — Progress Notes (Signed)
No meter and no blood sugar report today-to see Maggie May today Needs First Trimester screen-ordered

## 2010-12-14 NOTE — Progress Notes (Signed)
Pt. Went to [facility] this am for loss of NG tube.  They elected not to put it back down as she is tolerating po and liquid phenergan seems to be working.  We discussed possibly keeping something on her stomach at all times.  She has no blood sugar log, reports having no meter or test strips.  To see Maggie today.   Needs First Screen-ordered.

## 2010-12-14 NOTE — Progress Notes (Signed)
NG tube came out this morning went to [facility] and was told that she didn't need to have it back in. No vomiting as long as she takes phenergan

## 2010-12-14 NOTE — Progress Notes (Signed)
12/14/2010 Diabetes:  Diabetes since age 30.  Was on Lantus and Novolog.  Currently taking NPH (cloudy) 15 units in AM and HS and Novolog 15 units before meals (pens).  Does not have meter and has not been checking blood glucose levels.  Recently in hospital and had a feeding tube in-place to help to deal with the constant nausea.  This tube came out this morning and has been seen in the [facility] ED and it was decided to leave the tube out out this time.  I do not have an Accu-check meter and I provided her with the True Track meter and instructed her in its use.  Provided strips and lancets.  Will provide with the Accu-Check Meter on Dec. 3, 2012.  Today following grape juice from the ED, her glucose was 164.  She is to bring the meter and log book to each appointment.  Meter [id number] 2012/05/17, Lancets Lot: [id number]  2015/03/28; Strips: [id number] Expiration 2012/06/07.  Maggie May, RN, RD, CDE.

## 2010-12-14 NOTE — Progress Notes (Signed)
Pt. Scheduled for First Trimester screening at [facility] 12/18/10 at 2pm.

## 2010-12-14 NOTE — ED Provider Notes (Signed)
History     No chief complaint on file.  HPI This is a 30 y.o. female at [redacted]w[redacted]d who presents via EMS with c/o feeding tube that came out this morning. States there was tube feeding in her mouth, so she pulled it out. States tape came out yesterday and she retaped it. "Got scared" and came in.    She has an appointment in [facility] this morning.   She states the liquid Phenergan has helped her "so much".  She is able to eat "all kinds of vegetables" and has also eaten pizza and hotdogs.  Thinks the hotdogs made her nauseated yesterday.    Fasting blood sugar was 136 today. States that is as good as it ever gets.   OB History    Grav Para Term Preterm Abortions TAB SAB Ect Mult Living   4 2 1 1 1   1  2       Past Medical History  Diagnosis Date  . Asthma   . Diabetes mellitus   . Mental disorder   . Depression     No past surgical history on file.  Family History  Problem Relation Age of Onset  . Heart disease Mother     CHF    History  Substance Use Topics  . Smoking status: Never Smoker   . Smokeless tobacco: Never Used  . Alcohol Use: Yes    Allergies:  Allergies  Allergen Reactions  . Morphine And Related Nausea And Vomiting    Prescriptions prior to admission  Medication Sig Dispense Refill  . amoxicillin (AMOXIL) 250 MG/5ML suspension Take 1,000 mg by mouth 2 (two) times daily.        Marland Kitchen docusate (COLACE) 50 MG/5ML liquid Place 10 mLs (100 mg total) into feeding tube 2 (two) times daily.  100 mL  0  . insulin aspart (NOVOLOG) 100 UNIT/ML injection Inject 15 Units into the skin 3 (three) times daily before meals.  10 mL  2  . insulin NPH (HUMULIN N,NOVOLIN N) 100 UNIT/ML injection Inject 15 Units into the skin 2 (two) times daily in the am and at bedtime..  10 mL  2  . Nutritional Supplements (FEEDING SUPPLEMENT, JEVITY 1.2,) LIQD Place 65 mL/hr into feeding tube continuous. 55mL per hour continuous  30000 mL  0  . Prenatal Vit-Fe Fumarate-FA (RE PRENATAL  MULTIVITAMIN/IRON) 29-1 MG CHEW Chew 1 tablet by mouth daily.  30 tablet  0  . promethazine (PHENERGAN) 6.25 MG/5ML syrup Take 20 mLs (25 mg total) by mouth 4 (four) times daily as needed for nausea.  240 mL  0    Review of Systems  Constitutional: Negative for fever.  Gastrointestinal: Positive for nausea. Negative for vomiting and abdominal pain.   Physical Exam   Blood pressure 115/70, pulse 94, resp. rate 18, height 5\' 6"  (1.676 m), weight 181 lb 4 oz (82.214 kg), last menstrual period 09/19/2010.  Physical Exam  Constitutional: She is oriented to person, place, and time. She appears well-developed and well-nourished.  HENT:  Head: Normocephalic.  Cardiovascular: Normal rate.   Respiratory: Effort normal.  GI: Soft. She exhibits no distension. There is no tenderness.  Neurological: She is alert and oriented to person, place, and time.  Skin: Skin is warm and dry.  Psychiatric: She has a normal mood and affect.    MAU Course  Procedures   Assessment and Plan  A:  IUP at [redacted]w[redacted]d      Hyperemesis, improving  Feeding tube dislodged P:  Will leave tube out per consult Dr Roselie Awkward       Give dose of Phenergan and will Refill Phenergan      Asked about PNV >> recommend try 2 Flintstones/day      Keep appointment in clinic.  Hansel Feinstein 12/14/2010, 7:25 AM

## 2010-12-14 NOTE — Progress Notes (Signed)
PT HAS ARRIVED VIA EMS.  SAYS SHE HAS APPOINTMENT AT 0830  AT [facility]. SHE SAYS SHE VOMITS ALL TIME . HAS NOT VOMITED  LAST WEEK- TAKES MEDS.    SHE EATS VEGATABLES.  AT 0530-  AWOKE TO VOID- SHE HAD TUBE FEEDING  COME OUT NOSE,  WITH TUBE ,   SO SHE TOOK TUBE COMPLETELY OUT.       TUBE FEEDING PUMPS 24HRS / DAY.

## 2010-12-16 ENCOUNTER — Observation Stay (HOSPITAL_COMMUNITY)
Admission: AD | Admit: 2010-12-16 | Discharge: 2010-12-17 | DRG: 781 | Disposition: A | Discharge status: Home / Self Care | Payer: Medicaid Other | Source: Ambulatory Visit | Attending: Family Medicine | Admitting: Family Medicine

## 2010-12-16 ENCOUNTER — Encounter (HOSPITAL_COMMUNITY): Payer: Self-pay

## 2010-12-16 ENCOUNTER — Inpatient Hospital Stay (HOSPITAL_COMMUNITY): Payer: Medicaid Other

## 2010-12-16 DIAGNOSIS — E119 Type 2 diabetes mellitus without complications: Secondary | ICD-10-CM | POA: Diagnosis present

## 2010-12-16 DIAGNOSIS — O210 Mild hyperemesis gravidarum: Principal | ICD-10-CM | POA: Diagnosis present

## 2010-12-16 DIAGNOSIS — O24919 Unspecified diabetes mellitus in pregnancy, unspecified trimester: Secondary | ICD-10-CM | POA: Diagnosis present

## 2010-12-16 DIAGNOSIS — O10019 Pre-existing essential hypertension complicating pregnancy, unspecified trimester: Secondary | ICD-10-CM | POA: Diagnosis present

## 2010-12-16 HISTORY — DX: Anxiety disorder, unspecified: F41.9

## 2010-12-16 LAB — URINALYSIS, ROUTINE W REFLEX MICROSCOPIC
Bilirubin Urine: NEGATIVE
Nitrite: NEGATIVE
Specific Gravity, Urine: 1.02 (ref 1.005–1.030)
Urobilinogen, UA: 0.2 mg/dL (ref 0.0–1.0)
pH: 7 (ref 5.0–8.0)

## 2010-12-16 LAB — URINE MICROSCOPIC-ADD ON

## 2010-12-16 LAB — GLUCOSE, CAPILLARY: Glucose-Capillary: 104 mg/dL — ABNORMAL HIGH (ref 70–99)

## 2010-12-16 MED ORDER — PROMETHAZINE HCL 25 MG/ML IJ SOLN
12.5000 mg | Freq: Four times a day (QID) | INTRAMUSCULAR | Status: AC | PRN
  Administered 2010-12-17: 12.5 mg via INTRAVENOUS
  Filled 2010-12-16: qty 1

## 2010-12-16 MED ORDER — M.V.I. ADULT IV INJ
10.0000 mL | INJECTION | Freq: Once | INTRAVENOUS | Status: AC
  Administered 2010-12-16: 10 mL via INTRAVENOUS
  Filled 2010-12-16: qty 10

## 2010-12-16 MED ORDER — ZOLPIDEM TARTRATE 10 MG PO TABS
10.0000 mg | ORAL_TABLET | Freq: Every evening | ORAL | Status: DC | PRN
  Administered 2010-12-17: 10 mg via ORAL
  Filled 2010-12-16: qty 1

## 2010-12-16 MED ORDER — INSULIN NPH (HUMAN) (ISOPHANE) 100 UNIT/ML ~~LOC~~ SUSP
15.0000 [IU] | Freq: Every day | SUBCUTANEOUS | Status: DC
  Administered 2010-12-17: 15 [IU] via SUBCUTANEOUS

## 2010-12-16 MED ORDER — LACTATED RINGERS IV SOLN
INTRAVENOUS | Status: DC
  Administered 2010-12-16 – 2010-12-17 (×2): via INTRAVENOUS

## 2010-12-16 MED ORDER — INSULIN ASPART 100 UNIT/ML ~~LOC~~ SOLN
15.0000 [IU] | Freq: Three times a day (TID) | SUBCUTANEOUS | Status: DC
  Filled 2010-12-16: qty 3

## 2010-12-16 MED ORDER — INSULIN NPH (HUMAN) (ISOPHANE) 100 UNIT/ML ~~LOC~~ SUSP
15.0000 [IU] | Freq: Every day | SUBCUTANEOUS | Status: DC
  Administered 2010-12-17: 15 [IU] via SUBCUTANEOUS
  Filled 2010-12-16: qty 10

## 2010-12-16 MED ORDER — PROMETHAZINE HCL 25 MG/ML IJ SOLN
25.0000 mg | Freq: Once | INTRAVENOUS | Status: AC
  Administered 2010-12-16: 25 mg via INTRAVENOUS
  Filled 2010-12-16: qty 1

## 2010-12-16 MED ORDER — PROMETHAZINE HCL 25 MG/ML IJ SOLN
12.5000 mg | Freq: Four times a day (QID) | INTRAMUSCULAR | Status: AC | PRN
  Administered 2010-12-16: 12.5 mg via INTRAVENOUS
  Filled 2010-12-16 (×2): qty 1

## 2010-12-16 NOTE — ED Provider Notes (Signed)
Chart reviewed and agree with management and plan.  

## 2010-12-16 NOTE — ED Provider Notes (Signed)
History     Chief Complaint  Patient presents with  . Nausea   HPI [name] is 30 y.o. [id number] [redacted]w[redacted]d weeks presenting with recurrent nausea and vomiting.  Hx of feeding tubes.  Last seen here Monday after the tube came out.  Was also seen that date in the clinic.  Has phenergan at home but unable to keep it down.  Vomited X 4 today.  Not feeling well.  Had diarrhea off and on today.      Past Medical History  Diagnosis Date  . Asthma   . Diabetes mellitus   . Mental disorder   . Depression   . Anxiety     History reviewed. No pertinent past surgical history.  Family History  Problem Relation Age of Onset  . Heart disease Mother     CHF    History  Substance Use Topics  . Smoking status: Never Smoker   . Smokeless tobacco: Never Used  . Alcohol Use: No    Allergies:  Allergies  Allergen Reactions  . Morphine And Related Nausea And Vomiting    Prescriptions prior to admission  Medication Sig Dispense Refill  . ACCU-CHEK FASTCLIX LANCETS MISC 1 Units by Percutaneous route 4 (four) times daily.  100 each  12  . glucose blood (ACCU-CHEK SMARTVIEW) test strip Use as instructed to check blood sugars  100 each  12  . insulin aspart (NOVOLOG) 100 UNIT/ML injection Inject 15 Units into the skin 3 (three) times daily before meals.  10 mL  2  . insulin NPH (HUMULIN N,NOVOLIN N) 100 UNIT/ML injection Inject 15 Units into the skin 2 (two) times daily in the am and at bedtime..  10 mL  2  . Nutritional Supplements (FEEDING SUPPLEMENT, JEVITY 1.2,) LIQD Place 65 mL/hr into feeding tube continuous. 28mL per hour continuous  30000 mL  0  . Prenatal Vit-Fe Fumarate-FA (RE PRENATAL MULTIVITAMIN/IRON) 29-1 MG CHEW Chew 1 tablet by mouth daily.  30 tablet  0  . promethazine (PHENERGAN) 6.25 MG/5ML syrup Take 20 mLs (25 mg total) by mouth 4 (four) times daily as needed for nausea.  240 mL  2    Review of Systems  Constitutional: Negative for fever and chills.  Gastrointestinal:  Positive for nausea, vomiting and diarrhea.  Genitourinary: Negative.        Neg for vaginal bleeding   Physical Exam   Blood pressure 149/81, pulse 79, temperature 98.1 F (36.7 C), temperature source Oral, resp. rate 18, height 5\' 6"  (1.676 m), weight 176 lb (79.833 kg), last menstrual period 09/19/2010, SpO2 99.00%.  Physical Exam  Constitutional: She is oriented to person, place, and time. She appears well-developed and well-nourished.       Not feeling well  HENT:  Head: Normocephalic.  Neck: Normal range of motion.  Respiratory: Effort normal.  Neurological: She is alert and oriented to person, place, and time.  Skin: Skin is warm and dry.   Results for orders placed during the hospital encounter of 12/16/10 (from the past 24 hour(s))  URINALYSIS, ROUTINE W REFLEX MICROSCOPIC     Status: Abnormal   Collection Time   12/16/10  1:20 PM      Component Value Range   Color, Urine YELLOW  YELLOW    Appearance CLOUDY (*) CLEAR    Specific Gravity, Urine 1.020  1.005 - 1.030    pH 7.0  5.0 - 8.0    Glucose, UA NEGATIVE  NEGATIVE (mg/dL)   Hgb urine  dipstick TRACE (*) NEGATIVE    Bilirubin Urine NEGATIVE  NEGATIVE    Ketones, ur 15 (*) NEGATIVE (mg/dL)   Protein, ur NEGATIVE  NEGATIVE (mg/dL)   Urobilinogen, UA 0.2  0.0 - 1.0 (mg/dL)   Nitrite NEGATIVE  NEGATIVE    Leukocytes, UA TRACE (*) NEGATIVE   URINE MICROSCOPIC-ADD ON     Status: Abnormal   Collection Time   12/16/10  1:20 PM      Component Value Range   Squamous Epithelial / LPF FEW (*) RARE    WBC, UA 0-2  <3 (WBC/hpf)   RBC / HPF 0-2  <3 (RBC/hpf)   Bacteria, UA MANY (*) RARE    Urine-Other MICROSCOPIC EXAM PERFORMED ON UNCONCENTRATED URINE      MAU Course  Procedures   Urine culture sent to lab  MDM 13:58  Reported patient's sxs and UA result to Dr. Kennon Rounds.  Order given for 1 liter of LR with Phenergan 25mg , follow with 1 liter D5W with multivitamins.  If patient desires feeding tube, order and they can put  it in on the floor, then KUB for correct placement.   Assessment and Plan  A: Hyperemesis  P:  IV hydration per orders.  Sent to floor for hydration  Sohil Timko,EVE M 12/16/2010, 12:56 PM   Belinda Fisher, NP 12/16/10 1433  Belinda Fisher, NP 12/16/10 1437

## 2010-12-17 DIAGNOSIS — O211 Hyperemesis gravidarum with metabolic disturbance: Secondary | ICD-10-CM

## 2010-12-17 LAB — URINE CULTURE
Colony Count: >=100000
Culture  Setup Time: 201211290115

## 2010-12-17 LAB — GLUCOSE, CAPILLARY: Glucose-Capillary: 163 mg/dL — ABNORMAL HIGH (ref 70–99)

## 2010-12-17 MED ORDER — ONDANSETRON 4 MG PO TBDP: 4.0000 mg | ORAL_TABLET | ORAL | Status: AC | PRN

## 2010-12-17 MED ORDER — ONDANSETRON 4 MG PO TBDP
4.0000 mg | ORAL_TABLET | ORAL | Status: DC | PRN
  Filled 2010-12-17: qty 1

## 2010-12-17 NOTE — Consult Note (Addendum)
12-07-10 Phone referral received per RN from [employer] [employer].  I am familiar with this patient from many prior admssions.  Thus far, cbg's are controlled, however, there is no documentation of po intake thus far, thus assuming there hasn't been any due to severe nausea and vomitting.  Have spoken with patient in the past; she has received extensive education in the past as well.  Am glad to see pt and advise as to medication needs.  Will revisit in am.  Rosita Kea, RN, CNS, Diabetes Coordnator [phone])

## 2010-12-17 NOTE — Progress Notes (Signed)
Pt. Is discharged to home verbalizes understanding of insulin and home care needs. Pt. demonrated  How to administer insulin and records time of insulin  Administation. Pt  Aware of signs of labor and diabetic reactions necessary to foster good home care. Denies any present vomiting. Stable. Discharged per ambulatory,

## 2010-12-17 NOTE — Discharge Summary (Addendum)
Physician Discharge Summary  Patient ID: [name] MRN: [id number] DOB/AGE: 32-10-1980 32 y.o.  Admit date: 12/16/2010 Discharge date: 12/17/2010  Admission Diagnoses: Hyperemesis gravidarum   Discharge Diagnoses:  Active Problems:  * No active hospital problems. 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 She previously had a nasogastric tube that had been spontaneously expelled on Monday. The patient was unable to keep medications down. She was admitted with IV fluids and IV Phenergan, which she tolerated. She has been eating without emesis. She was also started on Zofran ODT which she tolerated with good effect. She'll be sent home with the addition of Zofran ODT and followup at [facility] on 12/21/2010.  Consults: Diabetes coordinator  Significant Diagnostic Studies: none  Treatments: IV hydration and antiemetics  Discharge Exam: Blood pressure 111/69, pulse 96, temperature 97.8 F (36.6 C), temperature source Oral, resp. rate 18, height 5\' 6"  (1.676 m), weight 79.833 kg (176 lb), last menstrual period 09/19/2010, SpO2 100.00%. General appearance: alert, cooperative and no distress Resp: clear to auscultation bilaterally Cardio: regular rate and rhythm, S1, S2 normal, no murmur, click, rub or gallop GI: soft, non-tender; bowel sounds normal; no masses,  no organomegaly Extremities: extremities normal, atraumatic, no cyanosis or edema Pulses: 2+ and symmetric  Disposition: Home or Self Care  Discharge Orders    Future Appointments: Provider: Department: Dept Phone: Center:   12/18/2010  2:00 PM [facility] [place] [facility] [phone] [facility]   12/18/2010 3:00 PM [facility] [phone] [facility]   12/21/2010  8:45 AM [facility] [phone] [place]     Current Discharge Medication List    START taking these medications   Details  ondansetron (ZOFRAN-ODT) 4 MG disintegrating tablet Take 1 tablet (4 mg total) by mouth every 4 (four) hours as needed. Qty: 20 tablet, Refills: 0      CONTINUE these medications which have NOT CHANGED   Details  insulin aspart (NOVOLOG) 100 UNIT/ML injection Inject 15 Units into the skin 3 (three) times daily before meals. Qty: 10 mL, Refills: 2    insulin NPH (HUMULIN N,NOVOLIN N) 100 UNIT/ML injection Inject 15 Units into the skin 2 (two) times daily in the am and at bedtime.Otho Darner: 10 mL, Refills: 2    Nutritional Supplements (FEEDING SUPPLEMENT, JEVITY 1.2,) LIQD Place 65 mL/hr into feeding tube continuous. 91mL per hour continuous Qty: 30000 mL, Refills: 0    Prenatal Vit-Fe Fumarate-FA (RE PRENATAL MULTIVITAMIN/IRON) 29-1 MG CHEW Chew 1 tablet by mouth daily. Qty: 30 tablet, Refills: 0    promethazine (PHENERGAN) 6.25 MG/5ML syrup Take 20 mLs (25 mg total) by mouth 4 (four) times daily as needed for nausea. Qty: 240 mL, Refills: 2    ACCU-CHEK FASTCLIX LANCETS MISC 1 Units by Percutaneous route 4 (four) times daily. Qty: 100 each, Refills: 12   Associated Diagnoses: Type 2 diabetes mellitus    glucose blood (ACCU-CHEK SMARTVIEW) test strip Use as instructed to check blood sugars Qty: 100 each, Refills: 12   Associated Diagnoses: Type 2 diabetes mellitus       Follow-up Information    Follow up with [facility] on 12/21/2010.   Contact information:   [place] [phone]  Greater than 30 minutes was spent in the preparation of this discharge.  SignedLoma Boston JEHIEL 12/17/2010, 3:46 PM

## 2010-12-17 NOTE — H&P (Signed)
Belinda Fisher, NP Nurse Practitioner Signed  ED Provider Notes 12/16/2010 12:56 PM  History       Chief Complaint   Patient presents with   .  Nausea    HPI [name] is 30 y.o. [id number] [redacted]w[redacted]d weeks presenting with recurrent nausea and vomiting.  Hx of feeding tubes.  Last seen here Monday after the tube came out.  Was also seen that date in the clinic.  Has phenergan at home but unable to keep it down.  Vomited X 4 today.  Not feeling well.  Had diarrhea off and on today.          Past Medical History   Diagnosis  Date   .  Asthma     .  Diabetes mellitus     .  Mental disorder     .  Depression     .  Anxiety        History reviewed. No pertinent past surgical history.    Family History   Problem  Relation  Age of Onset   .  Heart disease  Mother         CHF       History   Substance Use Topics   .  Smoking status:  Never Smoker    .  Smokeless tobacco:  Never Used   .  Alcohol Use:  No      Allergies:   Allergies   Allergen  Reactions   .  Morphine And Related  Nausea And Vomiting       Prescriptions prior to admission   Medication  Sig  Dispense  Refill   .  ACCU-CHEK FASTCLIX LANCETS MISC  1 Units by Percutaneous route 4 (four) times daily.   100 each   12   .  glucose blood (ACCU-CHEK SMARTVIEW) test strip  Use as instructed to check blood sugars   100 each   12   .  insulin aspart (NOVOLOG) 100 UNIT/ML injection  Inject 15 Units into the skin 3 (three) times daily before meals.   10 mL   2   .  insulin NPH (HUMULIN N,NOVOLIN N) 100 UNIT/ML injection  Inject 15 Units into the skin 2 (two) times daily in the am and at bedtime..   10 mL   2   .  Nutritional Supplements (FEEDING SUPPLEMENT, JEVITY 1.2,) LIQD  Place 65 mL/hr into feeding tube continuous. 49mL per hour continuous   30000 mL   0   .  Prenatal Vit-Fe Fumarate-FA (RE PRENATAL MULTIVITAMIN/IRON) 29-1 MG CHEW  Chew 1 tablet by mouth daily.   30 tablet   0   .  promethazine (PHENERGAN) 6.25 MG/5ML  syrup  Take 20 mLs (25 mg total) by mouth 4 (four) times daily as needed for nausea.   240 mL   2      Review of Systems  Constitutional: Negative for fever and chills.  Gastrointestinal: Positive for nausea, vomiting and diarrhea.  Genitourinary: Negative.         Neg for vaginal bleeding  Physical Exam      Blood pressure 149/81, pulse 79, temperature 98.1 F (36.7 C), temperature source Oral, resp. rate 18, height 5\' 6"  (1.676 m), weight 176 lb (79.833 kg), last menstrual period 09/19/2010, SpO2 99.00%.   Physical Exam  Constitutional: She is oriented to person, place, and time. She appears well-developed and well-nourished.       Not feeling well  HENT:  Head: Normocephalic.  Neck: Normal range of motion.  Respiratory: Effort normal.  Neurological: She is alert and oriented to person, place, and time.  Skin: Skin is warm and dry.  Results for orders placed during the hospital encounter of 12/16/10 (from the past 24 hour(s))   URINALYSIS, ROUTINE W REFLEX MICROSCOPIC     Status: Abnormal     Collection Time     12/16/10  1:20 PM       Component  Value  Range     Color, Urine  YELLOW   YELLOW      Appearance  CLOUDY (*)  CLEAR      Specific Gravity, Urine  1.020   1.005 - 1.030      pH  7.0   5.0 - 8.0      Glucose, UA  NEGATIVE   NEGATIVE (mg/dL)     Hgb urine dipstick  TRACE (*)  NEGATIVE      Bilirubin Urine  NEGATIVE   NEGATIVE      Ketones, ur  15 (*)  NEGATIVE (mg/dL)     Protein, ur  NEGATIVE   NEGATIVE (mg/dL)     Urobilinogen, UA  0.2   0.0 - 1.0 (mg/dL)     Nitrite  NEGATIVE   NEGATIVE      Leukocytes, UA  TRACE (*)  NEGATIVE    URINE MICROSCOPIC-ADD ON     Status: Abnormal     Collection Time     12/16/10  1:20 PM       Component  Value  Range     Squamous Epithelial / LPF  FEW (*)  RARE      WBC, UA  0-2   <3 (WBC/hpf)     RBC / HPF  0-2   <3 (RBC/hpf)     Bacteria, UA  MANY (*)  RARE      Urine-Other  MICROSCOPIC EXAM PERFORMED ON UNCONCENTRATED  URINE          MAU Course    Procedures   Urine culture sent to lab   MDM 13:58  Reported patient's sxs and UA result to Dr. Kennon Rounds.  Order given for 1 liter of LR with Phenergan 25mg , follow with 1 liter D5W with multivitamins.  If patient desires feeding tube, order and they can put it in on the floor, then KUB for correct placement.     Assessment and Plan    A: Hyperemesis   P:  IV hydration per orders.  Sent to floor for hydration   KEY,EVE M 12/16/2010, 12:56 PM    Belinda Fisher, NP 12/16/10 1433   Belinda Fisher, NP 12/16/10 1437  Cosigned by: Donnamae Jude, MD    [12/16/2010 5:42 PM]  Revision History...     Date/Time User Action   12/16/2010 5:42 PM Donnamae Jude, MD Cosign with note    12/16/2010 2:37 PM Eve Gillis Santa, NP Sign   12/16/2010 2:33 PM Eve M Key, NP Sign  View Details Report

## 2010-12-18 ENCOUNTER — Ambulatory Visit (HOSPITAL_COMMUNITY)
Admission: RE | Admit: 2010-12-18 | Discharge: 2010-12-18 | Disposition: A | Discharge status: Home / Self Care | Payer: Medicaid Other | Source: Ambulatory Visit | Attending: Family Medicine | Admitting: Family Medicine

## 2010-12-18 DIAGNOSIS — O3510X0 Maternal care for (suspected) chromosomal abnormality in fetus, unspecified, not applicable or unspecified: Secondary | ICD-10-CM | POA: Insufficient documentation

## 2010-12-18 DIAGNOSIS — Z8751 Personal history of pre-term labor: Secondary | ICD-10-CM | POA: Insufficient documentation

## 2010-12-18 DIAGNOSIS — O0990 Supervision of high risk pregnancy, unspecified, unspecified trimester: Secondary | ICD-10-CM

## 2010-12-18 DIAGNOSIS — E119 Type 2 diabetes mellitus without complications: Secondary | ICD-10-CM

## 2010-12-18 DIAGNOSIS — O351XX0 Maternal care for (suspected) chromosomal abnormality in fetus, not applicable or unspecified: Secondary | ICD-10-CM | POA: Insufficient documentation

## 2010-12-18 DIAGNOSIS — O24919 Unspecified diabetes mellitus in pregnancy, unspecified trimester: Secondary | ICD-10-CM | POA: Insufficient documentation

## 2010-12-18 DIAGNOSIS — Z3689 Encounter for other specified antenatal screening: Secondary | ICD-10-CM | POA: Insufficient documentation

## 2010-12-21 ENCOUNTER — Ambulatory Visit (INDEPENDENT_AMBULATORY_CARE_PROVIDER_SITE_OTHER): Payer: Medicaid Other | Admitting: Family Medicine

## 2010-12-21 ENCOUNTER — Encounter: Payer: Medicaid Other | Attending: Obstetrics and Gynecology | Admitting: Dietician

## 2010-12-21 DIAGNOSIS — O24919 Unspecified diabetes mellitus in pregnancy, unspecified trimester: Secondary | ICD-10-CM

## 2010-12-21 DIAGNOSIS — O99810 Abnormal glucose complicating pregnancy: Secondary | ICD-10-CM | POA: Insufficient documentation

## 2010-12-21 DIAGNOSIS — Z713 Dietary counseling and surveillance: Secondary | ICD-10-CM | POA: Insufficient documentation

## 2010-12-21 DIAGNOSIS — O0990 Supervision of high risk pregnancy, unspecified, unspecified trimester: Secondary | ICD-10-CM | POA: Insufficient documentation

## 2010-12-21 LAB — POCT URINALYSIS DIP (DEVICE)
Bilirubin Urine: NEGATIVE
Glucose, UA: NEGATIVE mg/dL
Hgb urine dipstick: NEGATIVE
Specific Gravity, Urine: 1.025 (ref 1.005–1.030)

## 2010-12-21 NOTE — Progress Notes (Signed)
Diabetes Education:  Provided an Market researcher education.  Meter Lot= [id number] Expiration 03/17/2012.  Completed a return demonstration.  Glucose fasting a t 10:15 Am = 63 mg/dl.  To bring meter and glucose log to clinic appointments.  Maggie Kimetha Trulson, RN, RD, CDE.

## 2010-12-21 NOTE — Patient Instructions (Signed)
Gestational Diabetes Mellitus Gestational diabetes mellitus (GDM) is diabetes that occurs only during pregnancy. This happens when the body cannot properly handle the glucose (sugar) that increases in the blood after eating. During pregnancy, insulin resistance (reduced sensitivity to insulin) occurs because of the release of hormones from the placenta. Usually, the pancreas of pregnant women produces enough insulin to overcome the resistance that occurs. However, in gestational diabetes, the insulin is there but it does not work effectively. If the resistance is severe enough that the pancreas does not produce enough insulin, extra glucose builds up in the blood.  WHO IS AT RISK FOR DEVELOPING GESTATIONAL DIABETES?  Women with a history of diabetes in the family.   Women over age 32.   Women who are overweight.   Women in certain ethnic groups (Hispanic, African American, Native American, [place] and [place]).  WHAT CAN HAPPEN TO THE BABY? If the mother's blood glucose is too high while she is pregnant, the extra sugar will travel through the umbilical cord to the baby. Some of the problems the baby may have are:  Large Baby - If the baby receives too much sugar, the baby will gain more weight. This may cause the baby to be too large to be born normally (vaginally) and a Cesarean section (C-section) may be needed.   Low Blood Glucose (hypoglycemia) - The baby makes extra insulin, in response to the extra sugar its gets from its mother. When the baby is born and no longer needs this extra insulin, the baby's blood glucose level may drop.   Jaundice (yellow coloring of the skin and eyes) - This is fairly common in babies. It is caused from a build-up of the chemical called bilirubin. This is rarely serious, but is seen more often in babies whose mothers had gestational diabetes.  RISKS TO THE MOTHER Women who have had gestational diabetes may be at higher risk for some problems,  including:  Preeclampsia or toxemia, which includes problems with high blood pressure. Blood pressure and protein levels in the urine must be checked frequently.   Infections.   Cesarean section (C-section) for delivery.   Developing Type 2 diabetes later in life. About 30-50% will develop diabetes later, especially if obese.  DIAGNOSIS  The hormones that cause insulin resistance are highest at about 24-28 weeks of pregnancy. If symptoms are experienced, they are much like symptoms you would normally expect during pregnancy.  GDM is often diagnosed using a two part method: 1. After 24-28 weeks of pregnancy, the woman drinks a glucose solution and takes a blood test. If the glucose level is high, a second test will be given.  2. Oral Glucose Tolerance Test (OGTT) which is 3 hours long - After not eating overnight, the blood glucose is checked. The woman drinks a glucose solution, and hourly blood glucose tests are taken.  If the woman has risk factors for GDM, the caregiver may test earlier than 24 weeks of pregnancy. TREATMENT  Treatment of GDM is directed at keeping the mother's blood glucose level normal, and may include:  Meal planning.   Taking insulin or other medicine to control your blood glucose level.   Exercise.   Keeping a daily record of the foods you eat.   Blood glucose monitoring and keeping a record of your blood glucose levels.   May monitor ketone levels in the urine, although this is no longer considered necessary in most pregnancies.  HOME CARE INSTRUCTIONS  While you are pregnant:  Follow your caregiver's advice regarding your prenatal appointments, meal planning, exercise, medicines, vitamins, blood and other tests, and physical activities.   Keep a record of your meals, blood glucose tests, and the amount of insulin you are taking (if any). Show this to your caregiver at every prenatal visit.   If you have GDM, you may have problems with hypoglycemia (low  blood glucose). You may suspect this if you become suddenly dizzy, feel shaky, and/or weak. If you think this is happening and you have a glucose meter, try to test your blood glucose level. Follow your caregiver's advice for when and how to treat your low blood glucose. Generally, the 15:15 rule is followed: Treat by consuming 15 grams of carbohydrates, wait 15 minutes, and recheck blood glucose. Examples of 15 grams of carbohydrates are:   1 cup skim or low-fat milk.    cup juice.   3-4 glucose tablets.   5-6 hard candies.   1 small box raisins.    cup regular soda pop.   Practice good hygiene, to avoid infections.   Do not smoke.  SEEK MEDICAL CARE IF:   You develop abnormal vaginal discharge, with or without itching.   You become weak and tired more than expected.   You seem to sweat a lot.   You have a sudden increase in weight, 5 pounds or more in one week.   You are losing weight, 3 pounds or more in a week.   Your blood glucose level is high, and you need instructions on what to do about it.  SEEK IMMEDIATE MEDICAL CARE IF:   You develop a severe headache.   You faint or pass out.   You develop nausea and vomiting.   You become disoriented or confused.   You have a convulsion.   You develop vision problems.   You develop stomach pain.   You develop vaginal bleeding.   You develop uterine contractions.   You have leaking or a gush of fluid from the vagina.  AFTER YOU HAVE THE BABY:  Go to all of your follow-up appointments, and have blood tests as advised by your caregiver.   Maintain a healthy lifestyle, to prevent diabetes in the future. This includes:   Following a healthy meal plan.   Controlling your weight.   Getting enough exercise and proper rest.   Do not smoke.   Breastfeed your baby if you can. This will lower the chance of you and your baby developing diabetes later in life.  For more information about diabetes, go to the American  Diabetes Association at: NiceStrategy.no. For more information about gestational diabetes, go to the [employer] of Obstetricians and Gynecologists at: RepublicForum.gl. Document Released: 04/12/2000 Document Revised: 09/16/2010 Document Reviewed: 11/04/2008 [facility] Patient Information 2012 [place], [place].

## 2010-12-21 NOTE — Progress Notes (Signed)
No book today, needs meter--last BS was 212 last pm, other Bs seem high, has good appetite--will need to adjust insulin when pt. Brings book next week.

## 2010-12-24 ENCOUNTER — Encounter (HOSPITAL_COMMUNITY): Payer: Self-pay | Admitting: *Deleted

## 2010-12-24 ENCOUNTER — Inpatient Hospital Stay (HOSPITAL_COMMUNITY)
Admission: AD | Admit: 2010-12-24 | Discharge: 2010-12-25 | Disposition: A | Discharge status: Home / Self Care | Payer: Medicaid Other | Source: Ambulatory Visit | Attending: Family Medicine | Admitting: Family Medicine

## 2010-12-24 DIAGNOSIS — O210 Mild hyperemesis gravidarum: Secondary | ICD-10-CM | POA: Insufficient documentation

## 2010-12-24 DIAGNOSIS — O211 Hyperemesis gravidarum with metabolic disturbance: Secondary | ICD-10-CM

## 2010-12-24 LAB — URINE MICROSCOPIC-ADD ON

## 2010-12-24 LAB — URINALYSIS, ROUTINE W REFLEX MICROSCOPIC
Bilirubin Urine: NEGATIVE
Glucose, UA: NEGATIVE mg/dL
Hgb urine dipstick: NEGATIVE
Ketones, ur: NEGATIVE mg/dL
Nitrite: NEGATIVE
Protein, ur: NEGATIVE mg/dL
Specific Gravity, Urine: 1.025 (ref 1.005–1.030)
Urobilinogen, UA: 0.2 mg/dL (ref 0.0–1.0)
pH: 6.5 (ref 5.0–8.0)

## 2010-12-24 MED ORDER — PROMETHAZINE HCL 25 MG/ML IJ SOLN
25.0000 mg | Freq: Once | INTRAMUSCULAR | Status: AC
  Administered 2010-12-24: 25 mg via INTRAMUSCULAR
  Filled 2010-12-24: qty 1

## 2010-12-24 MED ORDER — ONDANSETRON HCL 4 MG PO TABS
8.0000 mg | ORAL_TABLET | Freq: Once | ORAL | Status: AC
  Administered 2010-12-24: 8 mg via ORAL
  Filled 2010-12-24: qty 2

## 2010-12-24 NOTE — Progress Notes (Signed)
Pt came in EMS, vomitting - states medication is not working, can't hold it down.  Also C/O dizziness, pain in L side.  Denies diarrhea.

## 2010-12-24 NOTE — ED Provider Notes (Signed)
History     Chief Complaint  Patient presents with  . Emesis During Pregnancy   HPI [name] is 30 y.o. [id number] [redacted]w[redacted]d weeks presenting with persistent nausea and vomiting.  She has been without a feeding tube for 2 weeks doing well, keeping solid food down until yesterday.  States she doesn't feel well,  Back ache, left sided abdominal pain and has vomited X 4 today.  States she has not had a bowel movement in 2 days.  She last checked her blood sugar this afternoon between meals and it was 83.    Past Medical History  Diagnosis Date  . Asthma   . Diabetes mellitus   . Mental disorder   . Depression   . Anxiety     History reviewed. No pertinent past surgical history.  Family History  Problem Relation Age of Onset  . Heart disease Mother     CHF    History  Substance Use Topics  . Smoking status: Never Smoker   . Smokeless tobacco: Never Used  . Alcohol Use: No    Allergies:  Allergies  Allergen Reactions  . Morphine And Related Nausea And Vomiting    Prescriptions prior to admission  Medication Sig Dispense Refill  . ACCU-CHEK FASTCLIX LANCETS MISC 1 Units by Percutaneous route 4 (four) times daily.  100 each  12  . albuterol (PROVENTIL HFA;VENTOLIN HFA) 108 (90 BASE) MCG/ACT inhaler Inhale 2 puffs into the lungs every 6 (six) hours as needed.        Marland Kitchen glucose blood (ACCU-CHEK SMARTVIEW) test strip Use as instructed to check blood sugars  100 each  12  . insulin aspart (NOVOLOG) 100 UNIT/ML injection Inject 15 Units into the skin 3 (three) times daily before meals.  10 mL  2  . insulin NPH (HUMULIN N,NOVOLIN N) 100 UNIT/ML injection Inject 15 Units into the skin 2 (two) times daily in the am and at bedtime..  10 mL  2  . ondansetron (ZOFRAN) 4 MG tablet Take 4 mg by mouth every 4 (four) hours as needed.        . ondansetron (ZOFRAN-ODT) 4 MG disintegrating tablet Take 1 tablet (4 mg total) by mouth every 4 (four) hours as needed.  20 tablet  0  . Prenatal  Vit-Fe Fumarate-FA (RE PRENATAL MULTIVITAMIN/IRON) 29-1 MG CHEW Chew 1 tablet by mouth daily.  30 tablet  0  . Nutritional Supplements (FEEDING SUPPLEMENT, JEVITY 1.2,) LIQD Place 65 mL/hr into feeding tube continuous. 84mL per hour continuous  30000 mL  0    Review of Systems  Constitutional: Negative.  Negative for fever and chills.  Respiratory: Negative.   Cardiovascular: Negative.   Gastrointestinal: Positive for nausea and vomiting. Negative for abdominal pain, diarrhea and constipation.  Genitourinary: Negative for dysuria, urgency, frequency, hematuria and flank pain.       Negative for vaginal bleeding, vaginal discharge, dyspareunia  Musculoskeletal: Positive for back pain (left sided, midback).  Neurological: Negative.   Psychiatric/Behavioral: Negative.    Physical Exam   Blood pressure 110/74, pulse 82, temperature 98 F (36.7 C), temperature source Oral, resp. rate 18, height 5\' 6"  (1.676 m), weight 184 lb (83.462 kg), last menstrual period 09/19/2010, SpO2 99.00%.  Physical Exam  Nursing note and vitals reviewed. Constitutional: She is oriented to person, place, and time. She appears well-developed and well-nourished. Distressed: appears fatigued.  Cardiovascular: Normal rate.   Respiratory: Effort normal.  GI: Soft. There is no tenderness. There is no CVA  tenderness.  Musculoskeletal: Normal range of motion.  Neurological: She is alert and oriented to person, place, and time.  Skin: Skin is warm and dry.  Psychiatric:       Depressed mood and affect     MAU Course  Procedures  MDM 20:11 Care turned over to N. Mare Ferrari, CNM  Results for orders placed during the hospital encounter of 12/24/10 (from the past 24 hour(s))  URINALYSIS, ROUTINE W REFLEX MICROSCOPIC     Status: Abnormal   Collection Time   12/24/10  6:30 PM      Component Value Range   Color, Urine YELLOW  YELLOW    APPearance CLEAR  CLEAR    Specific Gravity, Urine 1.025  1.005 - 1.030    pH 6.5   5.0 - 8.0    Glucose, UA NEGATIVE  NEGATIVE (mg/dL)   Hgb urine dipstick NEGATIVE  NEGATIVE    Bilirubin Urine NEGATIVE  NEGATIVE    Ketones, ur NEGATIVE  NEGATIVE (mg/dL)   Protein, ur NEGATIVE  NEGATIVE (mg/dL)   Urobilinogen, UA 0.2  0.0 - 1.0 (mg/dL)   Nitrite NEGATIVE  NEGATIVE    Leukocytes, UA TRACE (*) NEGATIVE   URINE MICROSCOPIC-ADD ON     Status: Abnormal   Collection Time   12/24/10  6:30 PM      Component Value Range   Squamous Epithelial / LPF MANY (*) RARE    WBC, UA 7-10  <3 (WBC/hpf)   RBC / HPF 0-2  <3 (RBC/hpf)   Bacteria, UA MANY (*) RARE    Urine-Other MUCOUS PRESENT     Pt not vomiting and not dehydrated. States that she has Zofran ODT at home, but vomits before it dissolves completely, has liquid phenergan, but vomits immediately upon taking it. Phenergan given IM - pt sleeping afterwards, tells RN nausea is improved, but then states that she cannot try to drink her ginger ale d/t feeling nauseous. Zofran 8 mg PO given with water, tolerated well. No vomiting afterwards, able to tolerate PO fluids.   8 lb weight gain since 11/29 Assessment and Plan    KEY,EVE M 12/24/2010, 7:28 PM   30 y.o. [id number] at [redacted]w[redacted]d Hyperemesis - appears stable, no vomiting during 5 hour MAU visit, no dehydration, good weight gain Rx Zofran 8 mg PO TID, Phenergan suppositories F/U as scheduled

## 2010-12-25 MED ORDER — PROMETHAZINE HCL 25 MG RE SUPP: 25.0000 mg | Freq: Four times a day (QID) | RECTAL | Status: DC | PRN

## 2010-12-25 MED ORDER — ONDANSETRON HCL 8 MG PO TABS: 8.0000 mg | ORAL_TABLET | Freq: Three times a day (TID) | ORAL | Status: DC | PRN

## 2010-12-26 ENCOUNTER — Inpatient Hospital Stay (HOSPITAL_COMMUNITY)
Admission: AD | Admit: 2010-12-26 | Discharge: 2010-12-27 | Disposition: A | Discharge status: Home / Self Care | Payer: Medicaid Other | Source: Ambulatory Visit | Attending: Obstetrics and Gynecology | Admitting: Obstetrics and Gynecology

## 2010-12-26 ENCOUNTER — Encounter (HOSPITAL_COMMUNITY): Payer: Self-pay

## 2010-12-26 DIAGNOSIS — O0990 Supervision of high risk pregnancy, unspecified, unspecified trimester: Secondary | ICD-10-CM

## 2010-12-26 DIAGNOSIS — O10019 Pre-existing essential hypertension complicating pregnancy, unspecified trimester: Secondary | ICD-10-CM

## 2010-12-26 DIAGNOSIS — E119 Type 2 diabetes mellitus without complications: Secondary | ICD-10-CM

## 2010-12-26 DIAGNOSIS — O10919 Unspecified pre-existing hypertension complicating pregnancy, unspecified trimester: Secondary | ICD-10-CM

## 2010-12-26 DIAGNOSIS — O210 Mild hyperemesis gravidarum: Secondary | ICD-10-CM

## 2010-12-26 LAB — URINALYSIS, ROUTINE W REFLEX MICROSCOPIC
Glucose, UA: NEGATIVE mg/dL
Ketones, ur: 15 mg/dL — AB
Protein, ur: >300 mg/dL — AB
Urobilinogen, UA: 1 mg/dL (ref 0.0–1.0)

## 2010-12-26 LAB — URINE MICROSCOPIC-ADD ON

## 2010-12-26 LAB — GLUCOSE, CAPILLARY

## 2010-12-26 MED ORDER — PROMETHAZINE HCL 25 MG/ML IJ SOLN
25.0000 mg | Freq: Once | INTRAVENOUS | Status: AC
  Administered 2010-12-26: 25 mg via INTRAVENOUS
  Filled 2010-12-26: qty 1

## 2010-12-26 NOTE — Progress Notes (Signed)
Pt states, " I've been vomiting since Tuesday. I was here Thursday and got more medicine for the vomiting, but it doesn't work. I've thrown up all day and I'm very weak."

## 2010-12-26 NOTE — ED Provider Notes (Signed)
History     Chief Complaint  Patient presents with  . Emesis During Pregnancy  . Fatigue   HPI [name] 30 y.o. 14w 0d gestation.  Client of [facility].  Comes to MAU today via EMS for nausea and vomiting.  States Phenergan suppositories, Phenergan tablets and liquid, Zofran ODT and Zofran pills have not stopped her vomiting today.  Last vomited at 6:30 pm tonight.  Significant history of Type 2 diabetes and is on insulin.  Took insulin both types last at midnight last night as she has not kept down any food today.  Last intercourse was last night.  OB History    Grav Para Term Preterm Abortions TAB SAB Ect Mult Living   4 2 1 1 1  0 0 1 0 2      Past Medical History  Diagnosis Date  . Asthma   . Diabetes mellitus   . Mental disorder   . Depression   . Anxiety     History reviewed. No pertinent past surgical history.  Family History  Problem Relation Age of Onset  . Heart disease Mother     CHF    History  Substance Use Topics  . Smoking status: Never Smoker   . Smokeless tobacco: Never Used  . Alcohol Use: No    Allergies:  Allergies  Allergen Reactions  . Morphine And Related Nausea And Vomiting    Prescriptions prior to admission  Medication Sig Dispense Refill  . ACCU-CHEK FASTCLIX LANCETS MISC 1 Units by Percutaneous route 4 (four) times daily.  100 each  12  . albuterol (PROVENTIL HFA;VENTOLIN HFA) 108 (90 BASE) MCG/ACT inhaler Inhale 2 puffs into the lungs every 6 (six) hours as needed.        Marland Kitchen glucose blood (ACCU-CHEK SMARTVIEW) test strip Use as instructed to check blood sugars  100 each  12  . insulin aspart (NOVOLOG) 100 UNIT/ML injection Inject 15 Units into the skin 3 (three) times daily before meals.  10 mL  2  . insulin NPH (HUMULIN N,NOVOLIN N) 100 UNIT/ML injection Inject 15 Units into the skin 2 (two) times daily in the am and at bedtime..  10 mL  2  . ondansetron (ZOFRAN) 8 MG tablet Take 8 mg by mouth every 8 (eight) hours as  needed.        . Prenatal Vit-Fe Fumarate-FA (RE PRENATAL MULTIVITAMIN/IRON) 29-1 MG CHEW Chew 1 tablet by mouth daily.  30 tablet  0  . promethazine (PHENERGAN) 25 MG suppository Place 25 mg rectally every 6 (six) hours as needed. May also use suppositories vaginally       . DISCONTD: ondansetron (ZOFRAN) 8 MG tablet Take 1 tablet (8 mg total) by mouth every 8 (eight) hours as needed for nausea.  30 tablet  2  . DISCONTD: promethazine (PHENERGAN) 25 MG suppository Place 1 suppository (25 mg total) rectally every 6 (six) hours as needed for nausea. May also use suppositories vaginally  60 each  1    Review of Systems  Gastrointestinal: Positive for nausea and vomiting.   Physical Exam   Blood pressure 101/74, pulse 91, temperature 98.6 F (37 C), temperature source Oral, resp. rate 16, last menstrual period 09/19/2010.  Physical Exam  Nursing note and vitals reviewed. Constitutional: She is oriented to person, place, and time. She appears well-developed and well-nourished.  HENT:  Head: Normocephalic.  Eyes: EOM are normal.  Neck: Neck supple.  Musculoskeletal: Normal range of motion.  Neurological: She  is alert and oriented to person, place, and time.  Skin: Skin is warm and dry.  Psychiatric: She has a normal mood and affect.    MAU Course  Procedures Results for orders placed during the hospital encounter of 12/26/10 (from the past 24 hour(s))  URINALYSIS, ROUTINE W REFLEX MICROSCOPIC     Status: Abnormal   Collection Time   12/26/10  7:45 PM      Component Value Range   Color, Urine YELLOW  YELLOW    APPearance CLEAR  CLEAR    Specific Gravity, Urine >1.030 (*) 1.005 - 1.030    pH 6.0  5.0 - 8.0    Glucose, UA NEGATIVE  NEGATIVE (mg/dL)   Hgb urine dipstick NEGATIVE  NEGATIVE    Bilirubin Urine NEGATIVE  NEGATIVE    Ketones, ur 15 (*) NEGATIVE (mg/dL)   Protein, ur >300 (*) NEGATIVE (mg/dL)   Urobilinogen, UA 1.0  0.0 - 1.0 (mg/dL)   Nitrite NEGATIVE  NEGATIVE     Leukocytes, UA TRACE (*) NEGATIVE   URINE MICROSCOPIC-ADD ON     Status: Abnormal   Collection Time   12/26/10  7:45 PM      Component Value Range   Squamous Epithelial / LPF RARE  RARE    WBC, UA 3-6  <3 (WBC/hpf)   RBC / HPF 0-2  <3 (RBC/hpf)   Bacteria, UA FEW (*) RARE    Urine-Other TRICHOMONAS PRESENT    URINE RAPID DRUG SCREEN (HOSP PERFORMED)     Status: Normal   Collection Time   12/26/10  7:45 PM      Component Value Range   Opiates NONE DETECTED  NONE DETECTED    Cocaine NONE DETECTED  NONE DETECTED    Benzodiazepines NONE DETECTED  NONE DETECTED    Amphetamines NONE DETECTED  NONE DETECTED    Tetrahydrocannabinol NONE DETECTED  NONE DETECTED    Barbiturates NONE DETECTED  NONE DETECTED   GLUCOSE, CAPILLARY     Status: Normal   Collection Time   12/26/10  9:26 PM      Component Value Range   Glucose-Capillary 97  70 - 99 (mg/dL)   Comment 1 Notify RN      MDM Consult with Dr. Elly Modena re: plan of care  Assessment and Plan  Hyperemesis  Plan Will discharge home as she has had one bag of fluid with Phenergan 25 mg.  Has rested in bed.  Has had no vomiting while in MAU.   Advised to call [facility] on Monday and be seen if she is continuing to have problems. Continue to use the medication you have at home to control vomiting. Plan to have the Trichomonas treated by the clinic when you are not having as much vomiting. Refer your partner for treatment. No intercourse until 10 days after you and your partner have completed treatment.  [name] 12/26/2010, 9:46 PM   Earlie Server, NP 12/27/10 0214

## 2010-12-26 NOTE — Progress Notes (Signed)
Patient is here with c/o n/v since Thursday. She states that she was given additional antiemetic (phenergan suppsitory and zofran tablets) but it has not given her relief. She states that she is a type 2 diabetic and her blood sugar been in the 60's today because she can't keep anything down.

## 2010-12-27 LAB — RAPID URINE DRUG SCREEN, HOSP PERFORMED
Amphetamines: NOT DETECTED
Cocaine: NOT DETECTED
Opiates: NOT DETECTED
Tetrahydrocannabinol: NOT DETECTED

## 2010-12-27 MED ORDER — METRONIDAZOLE 500 MG PO TABS: 2000.0000 mg | ORAL_TABLET | Freq: Once | ORAL | Status: AC

## 2010-12-27 NOTE — ED Provider Notes (Signed)
Agree with above note.  [name] 12/27/2010 7:11 AM

## 2010-12-28 ENCOUNTER — Ambulatory Visit (INDEPENDENT_AMBULATORY_CARE_PROVIDER_SITE_OTHER): Payer: Medicaid Other | Admitting: Obstetrics and Gynecology

## 2010-12-28 DIAGNOSIS — O0990 Supervision of high risk pregnancy, unspecified, unspecified trimester: Secondary | ICD-10-CM

## 2010-12-28 DIAGNOSIS — E119 Type 2 diabetes mellitus without complications: Secondary | ICD-10-CM

## 2010-12-28 DIAGNOSIS — O10919 Unspecified pre-existing hypertension complicating pregnancy, unspecified trimester: Secondary | ICD-10-CM

## 2010-12-28 DIAGNOSIS — O10019 Pre-existing essential hypertension complicating pregnancy, unspecified trimester: Secondary | ICD-10-CM

## 2010-12-28 DIAGNOSIS — F329 Major depressive disorder, single episode, unspecified: Secondary | ICD-10-CM

## 2010-12-28 DIAGNOSIS — F3289 Other specified depressive episodes: Secondary | ICD-10-CM

## 2010-12-28 LAB — POCT URINALYSIS DIP (DEVICE)
Hgb urine dipstick: NEGATIVE
Ketones, ur: NEGATIVE mg/dL
Nitrite: NEGATIVE
Protein, ur: 30 mg/dL — AB
pH: 6.5 (ref 5.0–8.0)

## 2010-12-28 MED ORDER — GLUCOSE BLOOD VI STRP: ORAL_STRIP | Status: DC

## 2010-12-28 MED ORDER — ACCU-CHEK FASTCLIX LANCETS MISC: 1.0000 [IU] | Freq: Four times a day (QID) | Status: DC

## 2010-12-28 MED ORDER — ONDANSETRON 4 MG PO TBDP: 4.0000 mg | ORAL_TABLET | Freq: Four times a day (QID) | ORAL | Status: AC | PRN

## 2010-12-28 MED ORDER — PROMETHAZINE HCL 25 MG RE SUPP: 25.0000 mg | Freq: Four times a day (QID) | RECTAL | Status: DC | PRN

## 2010-12-28 NOTE — Progress Notes (Signed)
Patient also complaining of nausea and emesis not relieved by oral antiemetic. Patient was recently seen in MAU and received IV hydration. Patient was in MAU for 3-4 hr and did not have emesis. Patient advised to use phenergan PR alternating with zofran ODT since unable to hold PO intake. Patient advised to eat very small meals frequently. Patient advised to return to MAU if symptoms persist despite my recommendations.

## 2010-12-28 NOTE — Progress Notes (Signed)
Pt can't keep anything down. Is feeling really bad today. Pt has +trich from mau, has not taken meds for treatment yet.

## 2010-12-28 NOTE — Progress Notes (Signed)
Patient did not bring log book. Based on meter records patient not checking CBg 4 times daily. One fasting value of 65, 2hr pp 81-168. Patient asked to keep a better log of CBG in order to better determine if changes need to be made to her insulin regimen.

## 2011-01-01 ENCOUNTER — Encounter (HOSPITAL_COMMUNITY): Payer: Self-pay | Admitting: *Deleted

## 2011-01-01 ENCOUNTER — Inpatient Hospital Stay (HOSPITAL_COMMUNITY)
Admission: AD | Admit: 2011-01-01 | Discharge: 2011-01-04 | DRG: 781 | Disposition: A | Discharge status: Home / Self Care | Payer: Medicaid Other | Source: Ambulatory Visit | Attending: Obstetrics & Gynecology | Admitting: Obstetrics & Gynecology

## 2011-01-01 DIAGNOSIS — O211 Hyperemesis gravidarum with metabolic disturbance: Principal | ICD-10-CM | POA: Diagnosis present

## 2011-01-01 DIAGNOSIS — F329 Major depressive disorder, single episode, unspecified: Secondary | ICD-10-CM | POA: Diagnosis present

## 2011-01-01 DIAGNOSIS — O10919 Unspecified pre-existing hypertension complicating pregnancy, unspecified trimester: Secondary | ICD-10-CM | POA: Diagnosis present

## 2011-01-01 DIAGNOSIS — E119 Type 2 diabetes mellitus without complications: Secondary | ICD-10-CM | POA: Diagnosis present

## 2011-01-01 DIAGNOSIS — F32A Depression, unspecified: Secondary | ICD-10-CM | POA: Diagnosis present

## 2011-01-01 DIAGNOSIS — F3289 Other specified depressive episodes: Secondary | ICD-10-CM | POA: Diagnosis present

## 2011-01-01 DIAGNOSIS — O10019 Pre-existing essential hypertension complicating pregnancy, unspecified trimester: Secondary | ICD-10-CM | POA: Diagnosis present

## 2011-01-01 DIAGNOSIS — O24919 Unspecified diabetes mellitus in pregnancy, unspecified trimester: Secondary | ICD-10-CM | POA: Diagnosis present

## 2011-01-01 DIAGNOSIS — O99340 Other mental disorders complicating pregnancy, unspecified trimester: Secondary | ICD-10-CM | POA: Diagnosis present

## 2011-01-01 DIAGNOSIS — E860 Dehydration: Secondary | ICD-10-CM | POA: Diagnosis present

## 2011-01-01 DIAGNOSIS — J45909 Unspecified asthma, uncomplicated: Secondary | ICD-10-CM | POA: Diagnosis present

## 2011-01-01 LAB — CBC
HCT: 27.6 % — ABNORMAL LOW (ref 36.0–46.0)
Hemoglobin: 9.1 g/dL — ABNORMAL LOW (ref 12.0–15.0)
MCH: 25.8 pg — ABNORMAL LOW (ref 26.0–34.0)
MCHC: 33 g/dL (ref 30.0–36.0)
RDW: 13.9 % (ref 11.5–15.5)

## 2011-01-01 LAB — GLUCOSE, CAPILLARY
Glucose-Capillary: 89 mg/dL (ref 70–99)
Glucose-Capillary: 71 mg/dL (ref 70–99)

## 2011-01-01 LAB — URINE MICROSCOPIC-ADD ON

## 2011-01-01 LAB — URINALYSIS, ROUTINE W REFLEX MICROSCOPIC
Glucose, UA: NEGATIVE mg/dL
Leukocytes, UA: NEGATIVE
Nitrite: NEGATIVE
Specific Gravity, Urine: 1.025 (ref 1.005–1.030)
pH: 6.5 (ref 5.0–8.0)

## 2011-01-01 LAB — COMPREHENSIVE METABOLIC PANEL
Albumin: 2.8 g/dL — ABNORMAL LOW (ref 3.5–5.2)
BUN: 13 mg/dL (ref 6–23)
Calcium: 10 mg/dL (ref 8.4–10.5)
GFR calc Af Amer: 60 mL/min — ABNORMAL LOW (ref >90)
Glucose, Bld: 92 mg/dL (ref 70–99)
Total Protein: 7 g/dL (ref 6.0–8.3)

## 2011-01-01 LAB — LIPASE, BLOOD: Lipase: 17 U/L (ref 11–59)

## 2011-01-01 MED ORDER — PANTOPRAZOLE SODIUM 40 MG IV SOLR
40.0000 mg | Freq: Every day | INTRAVENOUS | Status: DC
  Administered 2011-01-01 – 2011-01-03 (×3): 40 mg via INTRAVENOUS
  Filled 2011-01-01 (×4): qty 40

## 2011-01-01 MED ORDER — INSULIN NPH (HUMAN) (ISOPHANE) 100 UNIT/ML ~~LOC~~ SUSP: 15.0000 [IU] | Freq: Every day | SUBCUTANEOUS | Status: DC

## 2011-01-01 MED ORDER — KCL-LACTATED RINGERS 20 MEQ/L IV SOLN: INTRAVENOUS | Status: DC

## 2011-01-01 MED ORDER — ACETAMINOPHEN 325 MG PO TABS: 650.0000 mg | ORAL_TABLET | ORAL | Status: DC | PRN

## 2011-01-01 MED ORDER — DOCUSATE SODIUM 100 MG PO CAPS
100.0000 mg | ORAL_CAPSULE | Freq: Every day | ORAL | Status: DC
  Administered 2011-01-04: 100 mg via ORAL
  Filled 2011-01-01: qty 1

## 2011-01-01 MED ORDER — METOCLOPRAMIDE HCL 10 MG PO TABS
10.0000 mg | ORAL_TABLET | Freq: Three times a day (TID) | ORAL | Status: DC
  Filled 2011-01-01: qty 1

## 2011-01-01 MED ORDER — PRENATAL PLUS 27-1 MG PO TABS
1.0000 | ORAL_TABLET | Freq: Every day | ORAL | Status: DC
  Filled 2011-01-01: qty 1

## 2011-01-01 MED ORDER — CALCIUM CARBONATE ANTACID 500 MG PO CHEW
1.0000 | CHEWABLE_TABLET | Freq: Once | ORAL | Status: AC
  Administered 2011-01-01: 200 mg via ORAL
  Filled 2011-01-01: qty 1

## 2011-01-01 MED ORDER — INSULIN ASPART 100 UNIT/ML ~~LOC~~ SOLN
15.0000 [IU] | Freq: Three times a day (TID) | SUBCUTANEOUS | Status: DC
  Administered 2011-01-03: 15 [IU] via SUBCUTANEOUS
  Filled 2011-01-01: qty 3

## 2011-01-01 MED ORDER — ONDANSETRON HCL 4 MG/2ML IJ SOLN
4.0000 mg | Freq: Four times a day (QID) | INTRAMUSCULAR | Status: DC | PRN
  Administered 2011-01-01 – 2011-01-02 (×4): 4 mg via INTRAVENOUS
  Filled 2011-01-01 (×4): qty 2

## 2011-01-01 MED ORDER — INSULIN NPH (HUMAN) (ISOPHANE) 100 UNIT/ML ~~LOC~~ SUSP
15.0000 [IU] | SUBCUTANEOUS | Status: DC
Start: 2011-01-01 — End: 2011-01-04
  Administered 2011-01-02 – 2011-01-04 (×4): 15 [IU] via SUBCUTANEOUS
  Filled 2011-01-01: qty 10

## 2011-01-01 MED ORDER — PROMETHAZINE HCL 25 MG/ML IJ SOLN: 25.0000 mg | Freq: Once | INTRAMUSCULAR | Status: DC

## 2011-01-01 MED ORDER — ALBUTEROL SULFATE HFA 108 (90 BASE) MCG/ACT IN AERS
2.0000 | INHALATION_SPRAY | Freq: Four times a day (QID) | RESPIRATORY_TRACT | Status: DC | PRN
  Filled 2011-01-01: qty 6.7

## 2011-01-01 MED ORDER — ZOLPIDEM TARTRATE 10 MG PO TABS
10.0000 mg | ORAL_TABLET | Freq: Every evening | ORAL | Status: DC | PRN
  Administered 2011-01-02 – 2011-01-03 (×2): 10 mg via ORAL
  Filled 2011-01-01 (×2): qty 1

## 2011-01-01 MED ORDER — POTASSIUM CHLORIDE 2 MEQ/ML IV SOLN
INTRAVENOUS | Status: DC
  Administered 2011-01-01 – 2011-01-03 (×5): via INTRAVENOUS
  Filled 2011-01-01 (×9): qty 1000

## 2011-01-01 MED ORDER — SODIUM CHLORIDE 0.9 % IV BOLUS (SEPSIS): 1000.0000 mL | Freq: Once | INTRAVENOUS | Status: DC

## 2011-01-01 MED ORDER — SODIUM CHLORIDE 0.9 % IV SOLN
25.0000 mg | Freq: Once | INTRAVENOUS | Status: AC
  Administered 2011-01-01: 25 mg via INTRAVENOUS
  Filled 2011-01-01: qty 1

## 2011-01-01 MED ORDER — CALCIUM CARBONATE ANTACID 500 MG PO CHEW
2.0000 | CHEWABLE_TABLET | ORAL | Status: DC | PRN
  Administered 2011-01-03: 400 mg via ORAL
  Filled 2011-01-01: qty 2

## 2011-01-01 MED ORDER — METOCLOPRAMIDE HCL 10 MG PO TABS: 10.0000 mg | ORAL_TABLET | Freq: Once | ORAL | Status: DC

## 2011-01-01 NOTE — Progress Notes (Signed)
Ongoing nausea and vomiting.  Pain for 4-5 days, mid left abd.  Boyfriend called EMS.  Unable to eat.  Pt in pain.

## 2011-01-01 NOTE — ED Notes (Signed)
Bedside sono to check FH. +FM noted  FH 145

## 2011-01-01 NOTE — ED Provider Notes (Signed)
History     Chief Complaint  Patient presents with  . Emesis During Pregnancy   HPI This is a 30 year old G4 P1 112 at 40 weeks and 6 days with hyperemesis gravidarum, type 2 diabetes, and chronic hypertension in pregnancy who presents to the MAU with vomiting that started approximately 2 days ago. She has not been able to keep anything down for the past 2 days. The vomiting is worse with eating. She has been taking Phenergan suppositories and Zofran ODT without improvement. She denies fever, chills, diarrhea, constipation, myalgias. She reports no sick contacts.  She admits to left sided abdominal pain that started about the same time that is unrelieved by zofran, phenergan, tums, food.  Rocking back and forth helps with the pain.  OB History    Grav Para Term Preterm Abortions TAB SAB Ect Mult Living   4 2 1 1 1  0 0 1 0 2      Past Medical History  Diagnosis Date  . Asthma   . Diabetes mellitus   . Mental disorder   . Depression   . Anxiety     Past Surgical History  Procedure Date  . No past surgeries     Family History  Problem Relation Age of Onset  . Heart disease Mother     CHF  . Anesthesia problems Neg Hx     History  Substance Use Topics  . Smoking status: Never Smoker   . Smokeless tobacco: Never Used  . Alcohol Use: No     not with preg    Allergies:  Allergies  Allergen Reactions  . Morphine And Related Nausea And Vomiting    Prescriptions prior to admission  Medication Sig Dispense Refill  . ACCU-CHEK FASTCLIX LANCETS MISC 1 Units by Percutaneous route 4 (four) times daily.  100 each  12  . albuterol (PROVENTIL HFA;VENTOLIN HFA) 108 (90 BASE) MCG/ACT inhaler Inhale 2 puffs into the lungs every 6 (six) hours as needed. For shortness of breath      . glucose blood (ACCU-CHEK SMARTVIEW) test strip Use as instructed to check blood sugars  100 each  12  . glucose blood test strip Use as instructed  100 each  12  . insulin aspart (NOVOLOG) 100 UNIT/ML  injection Inject 15 Units into the skin 3 (three) times daily before meals.  10 mL  2  . insulin NPH (HUMULIN N,NOVOLIN N) 100 UNIT/ML injection Inject 15 Units into the skin 2 (two) times daily in the am and at bedtime..  10 mL  2  . ondansetron (ZOFRAN ODT) 4 MG disintegrating tablet Take 1 tablet (4 mg total) by mouth every 6 (six) hours as needed for nausea.  20 tablet  0  . ondansetron (ZOFRAN) 8 MG tablet Take 8 mg by mouth every 8 (eight) hours as needed. For nausea      . Prenatal Vit-Fe Fumarate-FA (RE PRENATAL MULTIVITAMIN/IRON) 29-1 MG CHEW Chew 1 tablet by mouth daily.  30 tablet  0  . promethazine (PHENERGAN) 25 MG suppository Place 25 mg rectally every 6 (six) hours as needed. For nausea/vomiting. May also use suppositories vaginally       . DISCONTD: promethazine (PHENERGAN) 25 MG suppository Place 1 suppository (25 mg total) rectally every 6 (six) hours as needed. May also use suppositories vaginally  12 each  1  . metroNIDAZOLE (FLAGYL) 500 MG tablet Take 4 tablets (2,000 mg total) by mouth once.  4 tablet  0    Review of  Systems  All other systems reviewed and are negative.   Physical Exam   Blood pressure 117/91, pulse 101, temperature 97.1 F (36.2 C), temperature source Oral, resp. rate 18, last menstrual period 09/19/2010, SpO2 99.00%.  Physical Exam  Constitutional: She is oriented to person, place, and time. She appears well-developed and well-nourished.  HENT:  Head: Normocephalic.  Neck: Normal range of motion.  Cardiovascular: Normal rate, regular rhythm and normal heart sounds.   Respiratory: Effort normal and breath sounds normal. No respiratory distress. She has no wheezes. She has no rales. She exhibits no tenderness.  GI: Soft. Bowel sounds are normal. She exhibits no distension and no mass. There is no tenderness. There is no rebound and no guarding.  Neurological: She is oriented to person, place, and time.  Skin: Skin is warm and dry. No rash noted. No  erythema. No pallor.   Fetal heart tones confirmed by bedside ultrasound with rate of 145.  Gross fetal movement seen on [place].  MAU Course  Procedures  MDM: Pain and vomiting unchanged with IV fluids, IV zofran and phenergan, and tums.  Assessment and Plan  32.  30 year old, [id number] with IUP at 14 weeks and 6 days 2.  Hyperemesis gravidum 3.  DM2 4.  Hypertension  Admit. IV fluids Phenergan, zofran, reglan, PPI CBGs fasting and 2 hr postprandial.  [name] JEHIEL 01/01/2011, 6:17 PM

## 2011-01-01 NOTE — Progress Notes (Signed)
Last BS was 99 around 1630.  Paramedic checked it was 36.  Pt has been vomiting blood.

## 2011-01-01 NOTE — H&P (Signed)
See MAU note. 

## 2011-01-01 NOTE — Progress Notes (Signed)
Pt states, " I've had nausea and vomiting for three days and I can't keep even water down and I have pain in my low abdomen."

## 2011-01-02 DIAGNOSIS — O211 Hyperemesis gravidarum with metabolic disturbance: Secondary | ICD-10-CM

## 2011-01-02 LAB — GLUCOSE, CAPILLARY
Glucose-Capillary: 90 mg/dL (ref 70–99)
Glucose-Capillary: 64 mg/dL — ABNORMAL LOW (ref 70–99)
Glucose-Capillary: 85 mg/dL (ref 70–99)

## 2011-01-02 MED ORDER — METOCLOPRAMIDE HCL 5 MG/ML IJ SOLN
10.0000 mg | Freq: Three times a day (TID) | INTRAMUSCULAR | Status: DC
  Administered 2011-01-02 – 2011-01-04 (×6): 10 mg via INTRAVENOUS
  Filled 2011-01-02 (×6): qty 2

## 2011-01-02 MED ORDER — PROMETHAZINE HCL 25 MG/ML IJ SOLN
12.5000 mg | Freq: Four times a day (QID) | INTRAMUSCULAR | Status: DC | PRN
  Administered 2011-01-02 (×2): 12.5 mg via INTRAVENOUS
  Administered 2011-01-03: 25 mg via INTRAVENOUS
  Filled 2011-01-02 (×3): qty 1

## 2011-01-02 MED ORDER — HYDROXYZINE HCL 50 MG/ML IM SOLN
25.0000 mg | Freq: Four times a day (QID) | INTRAMUSCULAR | Status: DC | PRN
  Administered 2011-01-02 (×2): 25 mg via INTRAMUSCULAR
  Filled 2011-01-02: qty 0.5

## 2011-01-02 NOTE — Progress Notes (Signed)
[place]) NOTE  [name] is a 30 y.o. [id number] at [redacted]w[redacted]d by early ultrasound who is admitted for hyperemesis.   Length of Stay:  1  Days  Subjective: "I still feel bad."  Considering NG tube.  Refuses PO reglan  Vitals:  Blood pressure 139/99, pulse 102, temperature 98.3 F (36.8 C), temperature source Oral, resp. rate 18, height 5\' 6"  (1.676 m), weight 79.068 kg (174 lb 5 oz), last menstrual period 09/19/2010, SpO2 100.00%. CBG this am 68 Physical Examination:  General appearance - ill-appearing Heart - normal rate and regular rhythm Abdomen - soft, nontender, nondistended Extremities: extremities normal, atraumatic, no cyanosis or edema and Homans sign is negative, no sign of DVT with DTRs 2+ bilaterally   Labs:  Recent Results (from the past 24 hour(s))  URINALYSIS, ROUTINE W REFLEX MICROSCOPIC   Collection Time   01/01/11  5:36 PM      Component Value Range   Color, Urine YELLOW  YELLOW    APPearance CLEAR  CLEAR    Specific Gravity, Urine 1.025  1.005 - 1.030    pH 6.5  5.0 - 8.0    Glucose, UA NEGATIVE  NEGATIVE (mg/dL)   Hgb urine dipstick NEGATIVE  NEGATIVE    Bilirubin Urine NEGATIVE  NEGATIVE    Ketones, ur 15 (*) NEGATIVE (mg/dL)   Protein, ur 100 (*) NEGATIVE (mg/dL)   Urobilinogen, UA 0.2  0.0 - 1.0 (mg/dL)   Nitrite NEGATIVE  NEGATIVE    Leukocytes, UA NEGATIVE  NEGATIVE   URINE MICROSCOPIC-ADD ON   Collection Time   01/01/11  5:36 PM      Component Value Range   Squamous Epithelial / LPF FEW (*) RARE    WBC, UA 3-6  <3 (WBC/hpf)   RBC / HPF 0-2  <3 (RBC/hpf)   Bacteria, UA FEW (*) RARE    Urine-Other MUCOUS PRESENT    CBC   Collection Time   01/01/11  5:53 PM      Component Value Range   WBC 7.8  4.0 - 10.5 (K/uL)   RBC 3.53 (*) 3.87 - 5.11 (MIL/uL)   Hemoglobin 9.1 (*) 12.0 - 15.0 (g/dL)   HCT 27.6 (*) 36.0 - 46.0 (%)   MCV 78.2  78.0 - 100.0 (fL)   MCH 25.8 (*) 26.0 - 34.0 (pg)   MCHC 33.0  30.0 - 36.0 (g/dL)    RDW 13.9  11.5 - 15.5 (%)   Platelets 257  150 - 400 (K/uL)  COMPREHENSIVE METABOLIC PANEL   Collection Time   01/01/11  5:53 PM      Component Value Range   Sodium 138  135 - 145 (mEq/L)   Potassium 3.8  3.5 - 5.1 (mEq/L)   Chloride 104  96 - 112 (mEq/L)   CO2 22  19 - 32 (mEq/L)   Glucose, Bld 92  70 - 99 (mg/dL)   BUN 13  6 - 23 (mg/dL)   Creatinine, Ser 1.34 (*) 0.50 - 1.10 (mg/dL)   Calcium 10.0  8.4 - 10.5 (mg/dL)   Total Protein 7.0  6.0 - 8.3 (g/dL)   Albumin 2.8 (*) 3.5 - 5.2 (g/dL)   AST 8  0 - 37 (U/L)   ALT 5  0 - 35 (U/L)   Alkaline Phosphatase 59  39 - 117 (U/L)   Total Bilirubin 0.2 (*) 0.3 - 1.2 (mg/dL)   GFR calc non Af Amer 52 (*) >90 (mL/min)   GFR calc Af Amer 60 (*) >  90 (mL/min)  LIPASE, BLOOD   Collection Time   01/01/11  5:53 PM      Component Value Range   Lipase 17  11 - 59 (U/L)  GLUCOSE, CAPILLARY   Collection Time   01/01/11  6:00 PM      Component Value Range   Glucose-Capillary 89  70 - 99 (mg/dL)  GLUCOSE, CAPILLARY   Collection Time   01/01/11  8:22 PM      Component Value Range   Glucose-Capillary 71  70 - 99 (mg/dL)  GLUCOSE, CAPILLARY   Collection Time   01/02/11  7:57 AM      Component Value Range   Glucose-Capillary 68 (*) 70 - 99 (mg/dL)    Imaging Studies:     Currently EPIC will not allow sonographic studies to automatically populate into notes.  In the meantime, copy and paste results into note or free text.  Medications:  Scheduled    . calcium carbonate  1 tablet Oral Once  . docusate sodium  100 mg Oral Daily  . insulin aspart  15 Units Subcutaneous TID AC  . insulin NPH  15 Units Subcutaneous BH-qamhs  . metoCLOPramide  10 mg Oral TID AC  . pantoprazole  40 mg Intravenous QHS  . prenatal vitamin w/FE, FA  1 tablet Oral Daily  . promethazine (PHENERGAN) IV infusion  25 mg Intravenous Once  . DISCONTD: insulin NPH  15 Units Subcutaneous QHS  . DISCONTD: metoCLOPramide  10 mg Oral Once  . DISCONTD: promethazine   25 mg Intravenous Once  . DISCONTD: sodium chloride  1,000 mL Intravenous Once   I have reviewed the patient's current medications.  ASSESSMENT: Patient Active Problem List  Diagnoses  . Type 2 diabetes mellitus  . Hyperemesis gravidarum before end of [redacted] week gestation, dehydration  . Chronic hypertension in pregnancy  . Depression  . Asthma  . H. pylori infection  . Supervision of high-risk pregnancy    PLAN: change reglan to IV.     CRESENZO-DISHMAN,Deejay Koppelman 01/02/2011,8:35 AM

## 2011-01-03 LAB — GLUCOSE, CAPILLARY
Glucose-Capillary: 21 mg/dL — CL (ref 70–99)
Glucose-Capillary: 31 mg/dL — CL (ref 70–99)
Glucose-Capillary: 55 mg/dL — ABNORMAL LOW (ref 70–99)
Glucose-Capillary: 97 mg/dL (ref 70–99)
Glucose-Capillary: 111 mg/dL — ABNORMAL HIGH (ref 70–99)

## 2011-01-03 LAB — GLUCOSE, RANDOM: Glucose, Bld: 43 mg/dL — CL (ref 70–99)

## 2011-01-03 MED ORDER — DEXTROSE 50 % IV SOLN: 50.0000 mL | Freq: Once | INTRAVENOUS | Status: AC | PRN

## 2011-01-03 MED ORDER — GLUCOSE-VITAMIN C 4-6 GM-MG PO CHEW
4.0000 | CHEWABLE_TABLET | ORAL | Status: DC | PRN
  Administered 2011-01-03: 4 via ORAL

## 2011-01-03 MED ORDER — INSULIN ASPART 100 UNIT/ML ~~LOC~~ SOLN
10.0000 [IU] | Freq: Once | SUBCUTANEOUS | Status: DC
  Filled 2011-01-03: qty 3

## 2011-01-03 MED ORDER — DEXTROSE 50 % IV SOLN: 25.0000 mL | Freq: Once | INTRAVENOUS | Status: AC | PRN

## 2011-01-03 MED ORDER — GLUCOSE-VITAMIN C 4-6 GM-MG PO CHEW
CHEWABLE_TABLET | ORAL | Status: AC
  Administered 2011-01-03: 10:00:00
  Filled 2011-01-03: qty 1

## 2011-01-03 MED ORDER — PROMETHAZINE HCL 25 MG PO TABS
12.5000 mg | ORAL_TABLET | Freq: Four times a day (QID) | ORAL | Status: DC | PRN
  Administered 2011-01-03: 12.5 mg via ORAL
  Filled 2011-01-03: qty 1

## 2011-01-03 NOTE — Progress Notes (Signed)
        Alphonsa Gin  CLINICAL SOCIAL WORK  BRIEF PSYCHOSOCIAL ASSESSMENT  Referred by  ___MD______ on _12/16/12__  For  assessment                  _x_ Patient Interview __Family Interview  _x_ Other:  boyfriend     PSYCHOSOCIAL DATA:   _x_Lives Alone  __ Lives with       __ Admitted from Facility: ___________________________ Level of Care:     Primary Support (Name/Relationship):  sister       Degree of support available:   Supportive per pt       CURRENT CONCERNS:     __ None noted Substance Abuse  __   Behavioral Health Issues  __  Financial Resources  __   Abuse/Neglect/Domestic Violence __  Cultural/Religious Issues  __   Post-Acute Placement  __ Adjustment to Illness  __   Knowledge/Cognitive Deficit  __   _x_ Other __support/resources/frequent admissions______________________________     SOCIAL WORK ASSESSMENT/PLAN: CSW met with pt and pt's boyfriend at bedside following referral from MD for possible secondary gain related to frequent hospital admissions.  Pt reports she is [redacted] weeks pregnant and has experienced severe nausea and vomiting throughout her pregnancy.  She states she has two teenage children and did not have this issue with those pregnancies.  Pt admits that she has eaten in hospital and has not experienced any vomiting.  CSW inquired about pt's support and she indicates her sister is her primary support and available when needed.  Boyfriend is involved but both pt and boyfriend admit that they "have ups and downs."  Pt receives food stamps, Medicaid, and Section 8 housing support.  She lives alone and said that she has joint custody of her children with her aunt.  Pt was initially shaking and rocking in bed at beginning of assessment, but then stopped several minutes later.  Pt admits that she has anxiety/depression and is seen in the [facility] where she is prescribed Lexapro.    _x_ No Further Intervention Required __ Psychosocial Support/Ongoing Assessment  of Needs __ Information/Referral to [employer]      __ Other             PATIENT'S/FAMILY'S RESPONSE TO PLAN OF CARE:   Pt was open to talk to [facility] but denied any resource needs.  She was interested in finding out about disability and having her tubes tied following baby's birth.  CSW suggested that she go to [facility] for disability questions and discuss the procedure with MD.  Pt did not feel there were any further resources that she could be offered to assist her at this time and did not indicate any other reason for coming to hospital repeatedly.  RN updated on assessment and CSW will sign off.              Salome Arnt

## 2011-01-03 NOTE — Progress Notes (Signed)
Patient ID: [name], female   DOB: [birth year], 30 y.o.   MRN: [id number] Patient reports persistent nausea but no emesis overnight. Patient was able to tolerate 2 full meals for lunch and dinner without emesis. Patient denies cramping/bleeding. +FM  Filed Vitals:   01/03/11 0603  BP: 130/86  Pulse: 83  Temp: 97.8 F (36.6 C)  Resp: 18   Abd: S/NT/ND Ext: NT, equal in size  A/P 30yo [id number] at 15w1 admitted with hyperemesis and type II DM - CBG well controlled - Hyperemesis seems to have resolved. -D/C planning later today s/p social work consultation - Consider outpatient initiation of home health care with IV hydration.

## 2011-01-03 NOTE — Progress Notes (Signed)
Results for ZAKYA, KUZMIN (MRN [id number]) as of 01/03/2011 11:45  Ref. Range 01/03/2011 10:44  Glucose Latest Range: 70-99 mg/dL 43 (LL)  Received critical value report from the lab. Bedside RN made aware and will follow up with MD. Golda Acre A

## 2011-01-04 MED ORDER — INSULIN ASPART 100 UNIT/ML ~~LOC~~ SOLN: 10.0000 [IU] | Freq: Three times a day (TID) | SUBCUTANEOUS | Status: DC

## 2011-01-04 MED ORDER — SODIUM CHLORIDE 0.9 % IJ SOLN
3.0000 mL | Freq: Two times a day (BID) | INTRAMUSCULAR | Status: DC
  Administered 2011-01-03: 3 mL via INTRAVENOUS

## 2011-01-04 MED ORDER — SODIUM CHLORIDE 0.9 % IV SOLN: 250.0000 mL | INTRAVENOUS | Status: DC | PRN

## 2011-01-04 MED ORDER — INSULIN ASPART 100 UNIT/ML ~~LOC~~ SOLN
10.0000 [IU] | Freq: Three times a day (TID) | SUBCUTANEOUS | Status: DC
Start: 2011-01-04 — End: 2011-01-04
  Administered 2011-01-04: 10 [IU] via SUBCUTANEOUS
  Filled 2011-01-04: qty 3

## 2011-01-04 MED ORDER — SODIUM CHLORIDE 0.9 % IJ SOLN: 3.0000 mL | INTRAMUSCULAR | Status: DC | PRN

## 2011-01-04 NOTE — Progress Notes (Signed)
Patient ID: AJAH BAIL, female   DOB: 12-May-1980, 30 y.o.   MRN: [id number] [place]) NOTE  MIRCALE CHEAH is a 30 y.o. [id number] at [redacted]w[redacted]d by early ultrasound who is admitted for Hyperemesis.   Fetal presentation is unsure. Length of Stay:  3  Days  Subjective: Still feels nauseated but she is eating full meals.  Had low blood sugar yesterday.  Vitals:  Blood pressure 110/76, pulse 87, temperature 98 F (36.7 C), temperature source Oral, resp. rate 18, height 5\' 6"  (1.676 m), weight 177 lb 12 oz (80.627 kg), last menstrual period 09/19/2010, SpO2 100.00%. Physical Examination:  General appearance - alert, well appearing, and in no distress Fundal Height:  size equals dates Abdomen:  Non-tender, soft Extremities: extremities normal, atraumatic, no cyanosis or edema    Labs:  Recent Results (from the past 24 hour(s))  GLUCOSE, CAPILLARY   Collection Time   01/03/11  7:39 AM      Component Value Range   Glucose-Capillary 92  70 - 99 (mg/dL)  GLUCOSE, CAPILLARY   Collection Time   01/03/11  9:50 AM      Component Value Range   Glucose-Capillary 24 (*) 70 - 99 (mg/dL)   Comment 1 Notify RN    GLUCOSE, CAPILLARY   Collection Time   01/03/11 10:09 AM      Component Value Range   Glucose-Capillary 21 (*) 70 - 99 (mg/dL)   Comment 1 Notify RN    GLUCOSE, CAPILLARY   Collection Time   01/03/11 10:20 AM      Component Value Range   Glucose-Capillary 31 (*) 70 - 99 (mg/dL)   Comment 1 Repeat Test    GLUCOSE, CAPILLARY   Collection Time   01/03/11 10:36 AM      Component Value Range   Glucose-Capillary 55 (*) 70 - 99 (mg/dL)  GLUCOSE, RANDOM   Collection Time   01/03/11 10:44 AM      Component Value Range   Glucose, Bld 43 (*) 70 - 99 (mg/dL)  GLUCOSE, CAPILLARY   Collection Time   01/03/11 11:20 AM      Component Value Range   Glucose-Capillary 97  70 - 99 (mg/dL)  GLUCOSE, CAPILLARY   Collection Time   01/03/11  1:58 PM      Component  Value Range   Glucose-Capillary 115 (*) 70 - 99 (mg/dL)  GLUCOSE, CAPILLARY   Collection Time   01/03/11  5:27 PM      Component Value Range   Glucose-Capillary 112 (*) 70 - 99 (mg/dL)  GLUCOSE, CAPILLARY   Collection Time   01/03/11  6:51 PM      Component Value Range   Glucose-Capillary 111 (*) 70 - 99 (mg/dL)  GLUCOSE, CAPILLARY   Collection Time   01/03/11 10:15 PM      Component Value Range   Glucose-Capillary 113 (*) 70 - 99 (mg/dL)    Medications:  Scheduled    . docusate sodium  100 mg Oral Daily  . glucose-Vitamin C      . insulin aspart  10 Units Subcutaneous Once  . insulin aspart  15 Units Subcutaneous TID AC  . insulin NPH  15 Units Subcutaneous BH-qamhs  . metoCLOPramide (REGLAN) injection  10 mg Intravenous TID AC & HS  . pantoprazole  40 mg Intravenous QHS  . prenatal vitamin w/FE, FA  1 tablet Oral Daily  . sodium chloride  3 mL Intravenous Q12H   I have reviewed the patient's  current medications.  ASSESSMENT: Patient Active Problem List  Diagnoses  . Type 2 diabetes mellitus  . Hyperemesis gravidarum before end of [redacted] week gestation, dehydration  . Chronic hypertension in pregnancy  . Depression  . Asthma  . H. pylori infection  . Supervision of high-risk pregnancy    PLAN: Discharge home.  PRATT,TANYA S 01/04/2011,7:31 AM

## 2011-01-04 NOTE — Plan of Care (Signed)
Problem: Phase II Progression Outcomes Goal: Discharge plan established Outcome: Progressing Tolerating diet Glucose under control,and patient able to manage it. Continue to teach patient how to make good food choices

## 2011-01-04 NOTE — Discharge Summary (Signed)
Physician Discharge Summary  Patient ID: [name] MRN: [id number] DOB/AGE: 32-22-1982 32 y.o.  Admit date: 01/01/2011 Discharge date: 01/04/2011   Discharge Diagnoses:  Principal Problem:  *Hyperemesis gravidarum before end of [redacted] week gestation, dehydration Active Problems:  Type 2 diabetes mellitus  Chronic hypertension in pregnancy  Depression  Asthma   Consults: none  Significant Diagnostic Studies: labs:  CBC    Component Value Date/Time   WBC 7.8 01/01/2011 1753   RBC 3.53* 01/01/2011 1753   HGB 9.1* 01/01/2011 1753   HCT 27.6* 01/01/2011 1753   PLT 257 01/01/2011 1753   MCV 78.2 01/01/2011 1753   MCH 25.8* 01/01/2011 1753   MCHC 33.0 01/01/2011 1753   RDW 13.9 01/01/2011 1753   LYMPHSABS 2.1 12/08/2010 0845   MONOABS 0.4 12/08/2010 0845   EOSABS 0.3 12/08/2010 0845   BASOSABS 0.0 12/08/2010 0845   CMP     Component Value Date/Time   NA 138 01/01/2011 1753   K 3.8 01/01/2011 1753   CL 104 01/01/2011 1753   CO2 22 01/01/2011 1753   GLUCOSE 43* 01/03/2011 1044   BUN 13 01/01/2011 1753   CREATININE 1.34* 01/01/2011 1753   CALCIUM 10.0 01/01/2011 1753   PROT 7.0 01/01/2011 1753   ALBUMIN 2.8* 01/01/2011 1753   AST 8 01/01/2011 1753   ALT 5 01/01/2011 1753   ALKPHOS 59 01/01/2011 1753   BILITOT 0.2* 01/01/2011 1753   GFRNONAA 52* 01/01/2011 1753   GFRAA 60* 01/01/2011 1753      CBG (last 3)   Basename 01/03/11 2215 01/03/11 1851 01/03/11 1727  GLUCAP 113* 111* 112*     dip UA: negative, specific gravity 1.025, 100 protein, 15 ketones Weight change: 1.021 kg (2 lb 4 oz)   Hospital Course: Admitted for IV hydration and anti-emetics.  Pt. Had minimal emesis.  She gained 2 pounds.  Was tolerating po full meals.  Became stable for discharge.  Disposition: Home or Self Care  Discharged Condition: good  Discharge Orders    Future Appointments: Provider: Department: Dept Phone: Center:   01/04/2011 8:00 AM [facility] [phone] [facility]     Future Orders Please Complete By Expires   Discharge activity:  Bathroom / Shower only      Discharge instructions      Comments:   Keep hydrated, eat small meals, try eating before getting out of bed each morning, but after you check your blood sugar.   Discharge activity:  No Restrictions      Discharge diet:      Comments:   ADA---3 meals and 3 snacks.   No sexual activity restrictions        Current Discharge Medication List    CONTINUE these medications which have CHANGED   Details  insulin aspart (NOVOLOG) 100 UNIT/ML injection Inject 10 Units into the skin 3 (three) times daily before meals. Qty: 10 mL, Refills: 2      CONTINUE these medications which have NOT CHANGED   Details  ACCU-CHEK FASTCLIX LANCETS MISC 1 Units by Percutaneous route 4 (four) times daily. Qty: 100 each, Refills: 12   Associated Diagnoses: Type 2 diabetes mellitus    albuterol (PROVENTIL HFA;VENTOLIN HFA) 108 (90 BASE) MCG/ACT inhaler Inhale 2 puffs into the lungs every 6 (six) hours as needed. For shortness of breath    !! glucose blood (ACCU-CHEK SMARTVIEW) test strip Use as instructed to check blood sugars Qty: 100 each, Refills: 12   Associated Diagnoses:  Type 2 diabetes mellitus    !! glucose blood test strip Use as instructed Qty: 100 each, Refills: 12    insulin NPH (HUMULIN N,NOVOLIN N) 100 UNIT/ML injection Inject 15 Units into the skin 2 (two) times daily in the am and at bedtime.Otho Darner: 10 mL, Refills: 2    ondansetron (ZOFRAN ODT) 4 MG disintegrating tablet Take 1 tablet (4 mg total) by mouth every 6 (six) hours as needed for nausea. Qty: 20 tablet, Refills: 0    ondansetron (ZOFRAN) 8 MG tablet Take 8 mg by mouth every 8 (eight) hours as needed. For nausea    Prenatal Vit-Fe Fumarate-FA (RE PRENATAL MULTIVITAMIN/IRON) 29-1 MG CHEW Chew 1 tablet by mouth daily. Qty: 30 tablet, Refills: 0    promethazine (PHENERGAN) 25 MG suppository Place 25 mg  rectally every 6 (six) hours as needed. For nausea/vomiting. May also use suppositories vaginally     metroNIDAZOLE (FLAGYL) 500 MG tablet Take 4 tablets (2,000 mg total) by mouth once. Qty: 4 tablet, Refills: 0     !! - Potential duplicate medications found. Please discuss with provider.     Follow-up Information    Follow up with [facility] in 1 week. (Someone will contact you with appointment.)    Contact information:   [address] [place], [place]  [address] [phone]         Signed: Donnamae Jude 01/04/2011, 7:44 AM

## 2011-01-04 NOTE — Plan of Care (Signed)
Problem: Discharge Progression Outcomes Goal: Maintains admission weight or weight gain Outcome: Completed/Met Date Met:  01/04/11 No weight loss in the last 2 days Goal: Tolerating diet Outcome: Completed/Met Date Met:  01/04/11 Patient has had no emesis for at least 2 days or weight loss

## 2011-01-04 NOTE — Progress Notes (Signed)
Ur chart post discharge  review completed for  The dates of service on  11/28-11/29/12.

## 2011-01-04 NOTE — Progress Notes (Signed)
UR chart review completed.  

## 2011-01-09 ENCOUNTER — Encounter (HOSPITAL_COMMUNITY): Payer: Self-pay | Admitting: Obstetrics and Gynecology

## 2011-01-09 ENCOUNTER — Inpatient Hospital Stay (HOSPITAL_COMMUNITY)
Admission: AD | Admit: 2011-01-09 | Discharge: 2011-01-09 | Disposition: A | Discharge status: Home / Self Care | Payer: Medicaid Other | Source: Ambulatory Visit | Attending: Obstetrics and Gynecology | Admitting: Obstetrics and Gynecology

## 2011-01-09 DIAGNOSIS — O10919 Unspecified pre-existing hypertension complicating pregnancy, unspecified trimester: Secondary | ICD-10-CM

## 2011-01-09 DIAGNOSIS — O0990 Supervision of high risk pregnancy, unspecified, unspecified trimester: Secondary | ICD-10-CM

## 2011-01-09 DIAGNOSIS — O24919 Unspecified diabetes mellitus in pregnancy, unspecified trimester: Secondary | ICD-10-CM | POA: Insufficient documentation

## 2011-01-09 DIAGNOSIS — O10019 Pre-existing essential hypertension complicating pregnancy, unspecified trimester: Secondary | ICD-10-CM | POA: Insufficient documentation

## 2011-01-09 DIAGNOSIS — O219 Vomiting of pregnancy, unspecified: Secondary | ICD-10-CM

## 2011-01-09 DIAGNOSIS — E119 Type 2 diabetes mellitus without complications: Secondary | ICD-10-CM | POA: Insufficient documentation

## 2011-01-09 DIAGNOSIS — O99891 Other specified diseases and conditions complicating pregnancy: Secondary | ICD-10-CM | POA: Insufficient documentation

## 2011-01-09 DIAGNOSIS — K047 Periapical abscess without sinus: Secondary | ICD-10-CM | POA: Insufficient documentation

## 2011-01-09 HISTORY — DX: Essential (primary) hypertension: I10

## 2011-01-09 LAB — URINALYSIS, ROUTINE W REFLEX MICROSCOPIC
Glucose, UA: NEGATIVE mg/dL
Hgb urine dipstick: NEGATIVE
Leukocytes, UA: NEGATIVE
Protein, ur: 100 mg/dL — AB
Specific Gravity, Urine: >1.03 — ABNORMAL HIGH (ref 1.005–1.030)
Urobilinogen, UA: 0.2 mg/dL (ref 0.0–1.0)

## 2011-01-09 LAB — URINE MICROSCOPIC-ADD ON

## 2011-01-09 MED ORDER — PROMETHAZINE HCL 25 MG PO TABS: 12.5000 mg | ORAL_TABLET | Freq: Four times a day (QID) | ORAL | Status: DC | PRN

## 2011-01-09 MED ORDER — CLINDAMYCIN HCL 150 MG PO CAPS: 150.0000 mg | ORAL_CAPSULE | Freq: Three times a day (TID) | ORAL | Status: DC

## 2011-01-09 MED ORDER — PROMETHAZINE HCL 25 MG/ML IJ SOLN
25.0000 mg | Freq: Once | INTRAMUSCULAR | Status: AC
  Administered 2011-01-09: 25 mg via INTRAMUSCULAR
  Filled 2011-01-09: qty 1

## 2011-01-09 MED ORDER — HYDROMORPHONE HCL PF 1 MG/ML IJ SOLN
1.0000 mg | Freq: Once | INTRAMUSCULAR | Status: AC
  Administered 2011-01-09: 1 mg via INTRAMUSCULAR
  Filled 2011-01-09: qty 1

## 2011-01-09 MED ORDER — HYDROCODONE-ACETAMINOPHEN 5-325 MG PO TABS: 2.0000 | ORAL_TABLET | ORAL | Status: DC | PRN

## 2011-01-09 MED ORDER — CEFTRIAXONE SODIUM 250 MG IJ SOLR
250.0000 mg | Freq: Once | INTRAMUSCULAR | Status: AC
  Administered 2011-01-09: 250 mg via INTRAMUSCULAR
  Filled 2011-01-09 (×2): qty 250

## 2011-01-09 NOTE — Progress Notes (Signed)
Pt presents to [facility]U by EMS with chief complaint of vomiting associated with tooth pain. Pt says her tooth broke off 3 days ago. Pt is 16w pregnant and receives care [facility] the [facility]. Pt says the vomiting today has been uncontrollable.

## 2011-01-09 NOTE — ED Provider Notes (Signed)
History     CSN: [id number]  Arrival date & time 01/09/11  1206   None     Chief Complaint  Patient presents with  . Emesis  . Dental Pain    HPI [name] is a 30 y.o. @ [redacted]w[redacted]d gestation who complains of tooth ache that started 3 days ago after the tooth broke. Does not have a dentist. Pain has been so bad and today started vomiting and patient thinks is the results of so much pain. Denies diarrhea. Denies any problems of abdominal pain, vaginal bleeding or discharge. The history was provided by the patient.  Past Medical History  Diagnosis Date  . Asthma   . Diabetes mellitus   . Mental disorder   . Depression   . Anxiety   . Hypertension     Past Surgical History  Procedure Date  . No past surgeries     Family History  Problem Relation Age of Onset  . Heart disease Mother     CHF  . Anesthesia problems Neg Hx     History  Substance Use Topics  . Smoking status: Never Smoker   . Smokeless tobacco: Never Used  . Alcohol Use: No     not with preg    OB History    Grav Para Term Preterm Abortions TAB SAB Ect Mult Living   4 2 1 1 1  0 0 1 0 2      Review of Systems  Constitutional: Negative for fever, chills, appetite change and fatigue.  HENT: Positive for dental problem. Negative for ear pain and neck pain.   Eyes: Negative.   Respiratory: Negative.   Cardiovascular: Negative.   Gastrointestinal: Positive for nausea and vomiting. Negative for abdominal pain.  Genitourinary: Negative for dysuria, frequency, vaginal bleeding and vaginal discharge.       Pregnant  Skin: Negative.   Neurological: Positive for headaches.  Psychiatric/Behavioral: Negative for confusion and agitation.    Allergies  Morphine and related  Home Medications  No current outpatient prescriptions on file.  BP 122/74  Pulse 122  Temp(Src) 98.1 F (36.7 C) (Oral)  Resp 18  Ht 5\' 6"  (1.676 m)  Wt 176 lb 6.4 oz (80.015 kg)  BMI 28.47 kg/m2  LMP 09/19/2010  Physical  Exam  Nursing note and vitals reviewed. Constitutional: She is oriented to person, place, and time. She appears well-developed and well-nourished. No distress.  HENT:  Head: Normocephalic.  Mouth/Throat: Oropharynx is clear and moist and mucous membranes are normal. Dental caries present.         Multiple dental caries. Second molar right lower with fracture extending to the root. Swelling noted around the tooth.   Eyes: EOM are normal.  Neck: Neck supple.  Cardiovascular:       Tachycardia   Pulmonary/Chest: Effort normal.  Abdominal: Soft. There is no tenderness.       + doppler FHT  Musculoskeletal: Normal range of motion.  Neurological: She is alert and oriented to person, place, and time. No cranial nerve deficit.  Skin: Skin is warm and dry.  Psychiatric: Her behavior is normal. Judgment and thought content normal. Her mood appears anxious.    ED Course  Procedures  Assessment:  Dental Abscess   Second trimester pregnancy  Plan:  Phenergan 25 mg IM   Dilaudid 1 mg IM   Rocephin 250 mg IM   Rx Cleocin 150 mg po tid x 7 days   Rx Hydrocodone 5 mg po q  4 hours prn pain # 15   Rx Phenergan 12.5 mg po every 4 hours prn # 15   Follow up with a dentist ASAP   1. Supervision of high-risk pregnancy   2. Chronic hypertension in pregnancy   3. Type 2 diabetes mellitus       MDM          Debroah Baller, NP 01/09/11 [phone]

## 2011-01-09 NOTE — Progress Notes (Signed)
Pt reports having N/V x 2 days and a tooth ache x 3 days. Feels like "I have a lot of acid in my stomach>"

## 2011-01-10 NOTE — ED Provider Notes (Signed)
Agree with above note.  Timohty Renbarger 01/10/2011 7:38 AM

## 2011-01-12 ENCOUNTER — Encounter (HOSPITAL_COMMUNITY): Payer: Self-pay

## 2011-01-12 ENCOUNTER — Inpatient Hospital Stay (HOSPITAL_COMMUNITY)
Admission: AD | Admit: 2011-01-12 | Discharge: 2011-01-14 | DRG: 781 | Disposition: A | Discharge status: Home / Self Care | Payer: Medicaid Other | Source: Ambulatory Visit | Attending: Family Medicine | Admitting: Family Medicine

## 2011-01-12 DIAGNOSIS — K047 Periapical abscess without sinus: Secondary | ICD-10-CM | POA: Diagnosis present

## 2011-01-12 DIAGNOSIS — O211 Hyperemesis gravidarum with metabolic disturbance: Secondary | ICD-10-CM

## 2011-01-12 DIAGNOSIS — O210 Mild hyperemesis gravidarum: Principal | ICD-10-CM | POA: Diagnosis present

## 2011-01-12 DIAGNOSIS — K5900 Constipation, unspecified: Secondary | ICD-10-CM

## 2011-01-12 DIAGNOSIS — O9989 Other specified diseases and conditions complicating pregnancy, childbirth and the puerperium: Secondary | ICD-10-CM

## 2011-01-12 DIAGNOSIS — O99891 Other specified diseases and conditions complicating pregnancy: Secondary | ICD-10-CM | POA: Diagnosis present

## 2011-01-12 DIAGNOSIS — O10919 Unspecified pre-existing hypertension complicating pregnancy, unspecified trimester: Secondary | ICD-10-CM

## 2011-01-12 DIAGNOSIS — E119 Type 2 diabetes mellitus without complications: Secondary | ICD-10-CM

## 2011-01-12 LAB — CBC
Hemoglobin: 9.6 g/dL — ABNORMAL LOW (ref 12.0–15.0)
MCHC: 33 g/dL (ref 30.0–36.0)
Platelets: 292 10*3/uL (ref 150–400)
RDW: 13.6 % (ref 11.5–15.5)

## 2011-01-12 LAB — COMPREHENSIVE METABOLIC PANEL
ALT: 12 U/L (ref 0–35)
Albumin: 2.8 g/dL — ABNORMAL LOW (ref 3.5–5.2)
Alkaline Phosphatase: 78 U/L (ref 39–117)
Potassium: 3.7 mEq/L (ref 3.5–5.1)
Sodium: 133 mEq/L — ABNORMAL LOW (ref 135–145)
Total Protein: 8 g/dL (ref 6.0–8.3)

## 2011-01-12 LAB — URINE MICROSCOPIC-ADD ON

## 2011-01-12 LAB — URINALYSIS, ROUTINE W REFLEX MICROSCOPIC
Glucose, UA: NEGATIVE mg/dL
Ketones, ur: >80 mg/dL — AB
Specific Gravity, Urine: >1.03 — ABNORMAL HIGH (ref 1.005–1.030)
pH: 6 (ref 5.0–8.0)

## 2011-01-12 MED ORDER — PROMETHAZINE HCL 25 MG/ML IJ SOLN
25.0000 mg | INTRAVENOUS | Status: DC
  Administered 2011-01-12 – 2011-01-13 (×2): 25 mg via INTRAVENOUS
  Filled 2011-01-12 (×3): qty 1

## 2011-01-12 MED ORDER — BISACODYL 10 MG RE SUPP
10.0000 mg | Freq: Every day | RECTAL | Status: DC | PRN
  Filled 2011-01-12: qty 1

## 2011-01-12 MED ORDER — CLINDAMYCIN HCL 150 MG PO CAPS
150.0000 mg | ORAL_CAPSULE | Freq: Three times a day (TID) | ORAL | Status: DC
  Filled 2011-01-12 (×3): qty 1

## 2011-01-12 MED ORDER — GI COCKTAIL ~~LOC~~
30.0000 mL | Freq: Once | ORAL | Status: AC
  Administered 2011-01-12: 30 mL via ORAL
  Filled 2011-01-12: qty 30

## 2011-01-12 MED ORDER — THIAMINE HCL 100 MG/ML IJ SOLN
Freq: Once | INTRAVENOUS | Status: AC
  Administered 2011-01-12: 11:00:00 via INTRAVENOUS
  Filled 2011-01-12: qty 1000

## 2011-01-12 MED ORDER — LACTATED RINGERS IV BOLUS (SEPSIS)
1000.0000 mL | Freq: Once | INTRAVENOUS | Status: AC
  Administered 2011-01-12: 1000 mL via INTRAVENOUS

## 2011-01-12 MED ORDER — PRENATAL MULTIVITAMIN CH
1.0000 | ORAL_TABLET | Freq: Every day | ORAL | Status: DC
  Filled 2011-01-12 (×3): qty 1

## 2011-01-12 MED ORDER — INSULIN NPH (HUMAN) (ISOPHANE) 100 UNIT/ML ~~LOC~~ SUSP
15.0000 [IU] | Freq: Two times a day (BID) | SUBCUTANEOUS | Status: DC
  Filled 2011-01-12: qty 10

## 2011-01-12 MED ORDER — ONDANSETRON HCL 4 MG/2ML IJ SOLN
4.0000 mg | Freq: Four times a day (QID) | INTRAMUSCULAR | Status: DC
  Administered 2011-01-12 – 2011-01-13 (×3): 4 mg via INTRAVENOUS
  Filled 2011-01-12 (×3): qty 2

## 2011-01-12 MED ORDER — PROMETHAZINE HCL 25 MG/ML IJ SOLN
25.0000 mg | INTRAVENOUS | Status: DC
  Administered 2011-01-12 (×2): 25 mg via INTRAVENOUS
  Filled 2011-01-12 (×2): qty 1

## 2011-01-12 MED ORDER — ONDANSETRON HCL 4 MG/2ML IJ SOLN
4.0000 mg | Freq: Once | INTRAMUSCULAR | Status: AC
  Administered 2011-01-12: 4 mg via INTRAVENOUS
  Filled 2011-01-12: qty 2

## 2011-01-12 MED ORDER — OXYCODONE-ACETAMINOPHEN 5-325 MG PO TABS
1.0000 | ORAL_TABLET | ORAL | Status: DC | PRN
  Administered 2011-01-13 – 2011-01-14 (×3): 1 via ORAL
  Filled 2011-01-12 (×3): qty 1

## 2011-01-12 MED ORDER — INSULIN NPH (HUMAN) (ISOPHANE) 100 UNIT/ML ~~LOC~~ SUSP
15.0000 [IU] | Freq: Two times a day (BID) | SUBCUTANEOUS | Status: DC
  Administered 2011-01-13 – 2011-01-14 (×3): 15 [IU] via SUBCUTANEOUS

## 2011-01-12 MED ORDER — DOCUSATE SODIUM 100 MG PO CAPS
100.0000 mg | ORAL_CAPSULE | Freq: Every day | ORAL | Status: DC
  Administered 2011-01-13 – 2011-01-14 (×2): 100 mg via ORAL
  Filled 2011-01-12 (×4): qty 1

## 2011-01-12 MED ORDER — THIAMINE HCL 100 MG/ML IJ SOLN: Freq: Once | INTRAVENOUS | Status: DC

## 2011-01-12 MED ORDER — ZOLPIDEM TARTRATE 10 MG PO TABS: 10.0000 mg | ORAL_TABLET | Freq: Every evening | ORAL | Status: DC | PRN

## 2011-01-12 MED ORDER — PROMETHAZINE HCL 25 MG/ML IJ SOLN
25.0000 mg | Freq: Once | INTRAMUSCULAR | Status: DC
  Filled 2011-01-12: qty 1

## 2011-01-12 MED ORDER — ACETAMINOPHEN 325 MG PO TABS: 650.0000 mg | ORAL_TABLET | ORAL | Status: DC | PRN

## 2011-01-12 MED ORDER — INSULIN ASPART 100 UNIT/ML ~~LOC~~ SOLN
10.0000 [IU] | Freq: Three times a day (TID) | SUBCUTANEOUS | Status: DC
  Filled 2011-01-12: qty 3

## 2011-01-12 MED ORDER — LACTATED RINGERS IV SOLN
INTRAVENOUS | Status: DC
  Administered 2011-01-12: 13:00:00 via INTRAVENOUS

## 2011-01-12 MED ORDER — LACTATED RINGERS IV SOLN: INTRAVENOUS | Status: DC

## 2011-01-12 MED ORDER — PROMETHAZINE HCL 25 MG/ML IJ SOLN: 12.5000 mg | INTRAMUSCULAR | Status: DC | PRN

## 2011-01-12 MED ORDER — HYDROMORPHONE HCL PF 1 MG/ML IJ SOLN
1.0000 mg | INTRAMUSCULAR | Status: DC | PRN
  Administered 2011-01-12 – 2011-01-13 (×3): 1 mg via INTRAVENOUS
  Filled 2011-01-12 (×3): qty 1

## 2011-01-12 MED ORDER — ONDANSETRON HCL 4 MG/2ML IJ SOLN
4.0000 mg | Freq: Four times a day (QID) | INTRAMUSCULAR | Status: DC | PRN
  Administered 2011-01-12 (×2): 4 mg via INTRAVENOUS
  Filled 2011-01-12 (×2): qty 2

## 2011-01-12 MED ORDER — CALCIUM CARBONATE ANTACID 500 MG PO CHEW
2.0000 | CHEWABLE_TABLET | ORAL | Status: DC | PRN
  Filled 2011-01-12: qty 2

## 2011-01-12 MED ORDER — PROMETHAZINE HCL 25 MG/ML IJ SOLN
25.0000 mg | Freq: Once | INTRAMUSCULAR | Status: AC
  Administered 2011-01-12: 25 mg via INTRAMUSCULAR

## 2011-01-12 MED ORDER — METOCLOPRAMIDE HCL 5 MG/ML IJ SOLN
10.0000 mg | Freq: Four times a day (QID) | INTRAMUSCULAR | Status: DC
  Administered 2011-01-12 – 2011-01-13 (×4): 10 mg via INTRAVENOUS
  Filled 2011-01-12 (×4): qty 2

## 2011-01-12 MED ORDER — PROMETHAZINE HCL 25 MG/ML IJ SOLN: 12.5000 mg | INTRAMUSCULAR | Status: DC

## 2011-01-12 MED ORDER — ALPRAZOLAM 0.25 MG PO TABS
0.5000 mg | ORAL_TABLET | Freq: Once | ORAL | Status: AC
  Administered 2011-01-12: 0.5 mg via ORAL
  Filled 2011-01-12: qty 2

## 2011-01-12 MED ORDER — PANTOPRAZOLE SODIUM 40 MG IV SOLR
40.0000 mg | Freq: Every day | INTRAVENOUS | Status: DC
  Administered 2011-01-12 – 2011-01-13 (×2): 40 mg via INTRAVENOUS
  Filled 2011-01-12 (×3): qty 40

## 2011-01-12 NOTE — H&P (Signed)
[name] [id number] @[redacted]w[redacted]d  by early ultrasound Chief Complaint  Patient presents with  . Emesis    SUBJECTIVE  HPI: She presents via EMS with one-week history of retaining very little food or fluids. Home anti-emetics are not infected.She is experiencing abdominal pain with vomiting and retching. She's been constipated with last bowel movement one week ago. She was seen in MAU a few days ago diagnosed with dental abscess and given antibiotics and Percocet with some improvement. She is receiving prenatal care at [facility]. She states her blood sugars are in the range of 80s to high of postprandial 113. She is slated to start 17 P injections at next prenatal visit.  Past Medical History  Diagnosis Date  . Asthma   . Diabetes mellitus   . Mental disorder   . Depression   . Anxiety   . Hypertension    Past Surgical History  Procedure Date  . No past surgeries    History   Social History  . Marital Status: Single    Spouse Name: N/A    Number of Children: N/A  . Years of Education: N/A   Occupational History  . Not on file.   Social History Main Topics  . Smoking status: Never Smoker   . Smokeless tobacco: Never Used  . Alcohol Use: No     not with preg  . Drug Use: Yes    Special: Cocaine, Marijuana     No cocain use since found out she was pregnant.   . Sexually Active: Yes    Birth Control/ Protection: None   Other Topics Concern  . Not on file   Social History Narrative  . No narrative on file   No current facility-administered medications on file prior to encounter.   Current Outpatient Prescriptions on File Prior to Encounter  Medication Sig Dispense Refill  . ACCU-CHEK FASTCLIX LANCETS MISC 1 Units by Percutaneous route 4 (four) times daily.  100 each  12  . albuterol (PROVENTIL HFA;VENTOLIN HFA) 108 (90 BASE) MCG/ACT inhaler Inhale 2 puffs into the lungs every 6 (six) hours as needed. For shortness of breath      . clindamycin (CLEOCIN) 150  MG capsule Take 1 capsule (150 mg total) by mouth 3 (three) times daily.  21 capsule  0  . glucose blood (ACCU-CHEK SMARTVIEW) test strip Use as instructed to check blood sugars  100 each  12  . glucose blood test strip Use as instructed  100 each  12  . HYDROcodone-acetaminophen (NORCO) 5-325 MG per tablet Take 2 tablets by mouth every 4 (four) hours as needed for pain.  10 tablet  0  . insulin aspart (NOVOLOG) 100 UNIT/ML injection Inject 10 Units into the skin 3 (three) times daily before meals.  10 mL  2  . insulin NPH (HUMULIN N,NOVOLIN N) 100 UNIT/ML injection Inject 15 Units into the skin 2 (two) times daily in the am and at bedtime..  10 mL  2  . ondansetron (ZOFRAN) 8 MG tablet Take 8 mg by mouth every 8 (eight) hours as needed. For nausea      . prenatal vitamin w/FE, FA (NATACHEW) 29-1 MG CHEW Chew 1 tablet by mouth daily.        . promethazine (PHENERGAN) 25 MG tablet Take 0.5 tablets (12.5 mg total) by mouth every 6 (six) hours as needed for nausea.  20 tablet  0   Allergies  Allergen Reactions  . Morphine And Related Nausea And Vomiting  Review of Systems - Negative except as noted in HPI.   OBJECTIVE  BP 119/79  Pulse 129  Temp(Src) 98.1 F (36.7 C) (Oral)  Resp 18  LMP 09/19/2010  DT FHR 150  Physical exam  General appearance: Appears listless. Frequently retching.  Mental status: Alert and oriented. Blunted affect. HEENT: normocephalic, sl tenderness in region of rt lower molars CV: RRR without murmur Lungs: CTA bilaterally Abdomen: Soft, sluggish bowel sounds, mild epigastric     tenderness, no guarding or rebound Back: No CVA tenderness Skin: Dry,  decreased turgor  Results for orders placed during the hospital encounter of 01/12/11 (from the past 24 hour(s))  CBC     Status: Abnormal   Collection Time   01/12/11  7:43 AM      Component Value Range   WBC 8.3  4.0 - 10.5 (K/uL)   RBC 3.74 (*) 3.87 - 5.11 (MIL/uL)   Hemoglobin 9.6 (*) 12.0 - 15.0  (g/dL)   HCT 29.1 (*) 36.0 - 46.0 (%)   MCV 77.8 (*) 78.0 - 100.0 (fL)   MCH 25.7 (*) 26.0 - 34.0 (pg)   MCHC 33.0  30.0 - 36.0 (g/dL)   RDW 13.6  11.5 - 15.5 (%)   Platelets 292  150 - 400 (K/uL)  COMPREHENSIVE METABOLIC PANEL     Status: Abnormal   Collection Time   01/12/11  7:43 AM      Component Value Range   Sodium 133 (*) 135 - 145 (mEq/L)   Potassium 3.7  3.5 - 5.1 (mEq/L)   Chloride 97  96 - 112 (mEq/L)   CO2 24  19 - 32 (mEq/L)   Glucose, Bld 125 (*) 70 - 99 (mg/dL)   BUN 11  6 - 23 (mg/dL)   Creatinine, Ser 1.12 (*) 0.50 - 1.10 (mg/dL)   Calcium 9.5  8.4 - 10.5 (mg/dL)   Total Protein 8.0  6.0 - 8.3 (g/dL)   Albumin 2.8 (*) 3.5 - 5.2 (g/dL)   AST 14  0 - 37 (U/L)   ALT 12  0 - 35 (U/L)   Alkaline Phosphatase 78  39 - 117 (U/L)   Total Bilirubin 0.2 (*) 0.3 - 1.2 (mg/dL)   GFR calc non Af Amer 64 (*) >90 (mL/min)   GFR calc Af Amer 74 (*) >90 (mL/min)  URINALYSIS, ROUTINE W REFLEX MICROSCOPIC     Status: Abnormal   Collection Time   01/12/11  7:56 AM      Component Value Range   Color, Urine YELLOW  YELLOW    APPearance HAZY (*) CLEAR    Specific Gravity, Urine >1.030 (*) 1.005 - 1.030    pH 6.0  5.0 - 8.0    Glucose, UA NEGATIVE  NEGATIVE (mg/dL)   Hgb urine dipstick NEGATIVE  NEGATIVE    Bilirubin Urine MODERATE (*) NEGATIVE    Ketones, ur >80 (*) NEGATIVE (mg/dL)   Protein, ur 100 (*) NEGATIVE (mg/dL)   Urobilinogen, UA 1.0  0.0 - 1.0 (mg/dL)   Nitrite NEGATIVE  NEGATIVE    Leukocytes, UA TRACE (*) NEGATIVE   URINE MICROSCOPIC-ADD ON     Status: Abnormal   Collection Time   01/12/11  7:56 AM      Component Value Range   Squamous Epithelial / LPF MANY (*) RARE    WBC, UA 3-6  <3 (WBC/hpf)   RBC / HPF 0-2  <3 (RBC/hpf)   Bacteria, UA MANY (*) RARE    Urine-Other RARE YEAST  ASSESSMENT Hyperemesis with dehydration  Type 2 diabetes Constipation  Dental pain c/w abscess History preterm birth History hypertension    PLAN  Admit for  rehydration and antiemetics. NPO. For 24 hours. Discussed with Dr. Kennon Rounds

## 2011-01-12 NOTE — ED Provider Notes (Signed)
Chart reviewed and agree with management and plan.  

## 2011-01-12 NOTE — ED Notes (Signed)
DPoe, CNM at bedside with pt.

## 2011-01-12 NOTE — ED Notes (Signed)
No adverse effect from zofran, pt still vomiting, c/o indigestion.

## 2011-01-12 NOTE — ED Provider Notes (Signed)
History     CSN: [id number]  Arrival date & time 01/12/11  0737   None     No chief complaint on file.  HPI [name] is a 30 y.o. female @ [redacted]w[redacted]d gestation who presents to [facility] via EMS for nausea and vomiting. She has had several visits for the same. Patient reports that this episode started about a week ago and has continued and now she is having bad abdominal pain. Last BM was a week ago. The history was provided by the patient.  Past Medical History  Diagnosis Date  . Asthma   . Diabetes mellitus   . Mental disorder   . Depression   . Anxiety   . Hypertension     Past Surgical History  Procedure Date  . No past surgeries     Family History  Problem Relation Age of Onset  . Heart disease Mother     CHF  . Anesthesia problems Neg Hx     History  Substance Use Topics  . Smoking status: Never Smoker   . Smokeless tobacco: Never Used  . Alcohol Use: No     not with preg    OB History    Grav Para Term Preterm Abortions TAB SAB Ect Mult Living   4 2 1 1 1  0 0 1 0 2      Review of Systems  Constitutional: Negative for fever and chills.  Eyes: Negative.   Respiratory: Positive for cough.   Cardiovascular: Negative.   Gastrointestinal: Positive for nausea, vomiting and abdominal pain.  Genitourinary: Positive for decreased urine volume. Negative for dysuria, frequency, vaginal bleeding and vaginal discharge.       Pregnant  Musculoskeletal: Positive for back pain.  Neurological: Positive for headaches.  Psychiatric/Behavioral: Negative for confusion. The patient is nervous/anxious.     Allergies  Morphine and related  Home Medications  No current outpatient prescriptions on file.  BP 119/79  Pulse 129  Temp(Src) 98.1 F (36.7 C) (Oral)  Resp 18  LMP 09/19/2010  Physical Exam  Nursing note and vitals reviewed. Constitutional: She is oriented to person, place, and time. She appears well-developed and well-nourished.  HENT:  Head:  Normocephalic.  Eyes: EOM are normal.  Neck: Neck supple.  Cardiovascular:       tachycardia  Pulmonary/Chest: Effort normal. She has no wheezes. She has no rales.  Abdominal: Soft. There is generalized tenderness. There is no rebound, no guarding and no CVA tenderness.       Decreased BS.  Musculoskeletal: Normal range of motion.  Neurological: She is alert and oriented to person, place, and time. No cranial nerve deficit.  Skin: Skin is warm and dry.  Psychiatric: Her behavior is normal. Judgment and thought content normal. Her mood appears anxious.   Results for orders placed during the hospital encounter of 01/12/11 (from the past 24 hour(s))  CBC     Status: Abnormal   Collection Time   01/12/11  7:43 AM      Component Value Range   WBC 8.3  4.0 - 10.5 (K/uL)   RBC 3.74 (*) 3.87 - 5.11 (MIL/uL)   Hemoglobin 9.6 (*) 12.0 - 15.0 (g/dL)   HCT 29.1 (*) 36.0 - 46.0 (%)   MCV 77.8 (*) 78.0 - 100.0 (fL)   MCH 25.7 (*) 26.0 - 34.0 (pg)   MCHC 33.0  30.0 - 36.0 (g/dL)   RDW 13.6  11.5 - 15.5 (%)   Platelets 292  150 -  400 (K/uL)  COMPREHENSIVE METABOLIC PANEL     Status: Abnormal   Collection Time   01/12/11  7:43 AM      Component Value Range   Sodium 133 (*) 135 - 145 (mEq/L)   Potassium 3.7  3.5 - 5.1 (mEq/L)   Chloride 97  96 - 112 (mEq/L)   CO2 24  19 - 32 (mEq/L)   Glucose, Bld 125 (*) 70 - 99 (mg/dL)   BUN 11  6 - 23 (mg/dL)   Creatinine, Ser 1.12 (*) 0.50 - 1.10 (mg/dL)   Calcium 9.5  8.4 - 10.5 (mg/dL)   Total Protein 8.0  6.0 - 8.3 (g/dL)   Albumin 2.8 (*) 3.5 - 5.2 (g/dL)   AST 14  0 - 37 (U/L)   ALT 12  0 - 35 (U/L)   Alkaline Phosphatase 78  39 - 117 (U/L)   Total Bilirubin 0.2 (*) 0.3 - 1.2 (mg/dL)   GFR calc non Af Amer 64 (*) >90 (mL/min)   GFR calc Af Amer 74 (*) >90 (mL/min)  URINALYSIS, ROUTINE W REFLEX MICROSCOPIC     Status: Abnormal   Collection Time   01/12/11  7:56 AM      Component Value Range   Color, Urine YELLOW  YELLOW    APPearance HAZY  (*) CLEAR    Specific Gravity, Urine >1.030 (*) 1.005 - 1.030    pH 6.0  5.0 - 8.0    Glucose, UA NEGATIVE  NEGATIVE (mg/dL)   Hgb urine dipstick NEGATIVE  NEGATIVE    Bilirubin Urine MODERATE (*) NEGATIVE    Ketones, ur >80 (*) NEGATIVE (mg/dL)   Protein, ur 100 (*) NEGATIVE (mg/dL)   Urobilinogen, UA 1.0  0.0 - 1.0 (mg/dL)   Nitrite NEGATIVE  NEGATIVE    Leukocytes, UA TRACE (*) NEGATIVE   URINE MICROSCOPIC-ADD ON     Status: Abnormal   Collection Time   01/12/11  7:56 AM      Component Value Range   Squamous Epithelial / LPF MANY (*) RARE    WBC, UA 3-6  <3 (WBC/hpf)   RBC / HPF 0-2  <3 (RBC/hpf)   Bacteria, UA MANY (*) RARE    Urine-Other RARE YEAST     ED Course: Consult with Dr. Kennon Rounds @ 08:30 am. Will IV hydrate, antiemetics and observe for possible admission.    Procedures   11:00am Call to D. Poe, CNM and she will admit patient for continued hydration and hyperemesis management.   Assessment: Hyperemesis   Anxiety   Diabetes   Second trimester pregnancy  Plan:  Admission for hydration and n/v    MDM          Debroah Baller, NP 01/12/11 1101

## 2011-01-12 NOTE — H&P (Signed)
Chart reviewed and agree with management and plan.  

## 2011-01-12 NOTE — ED Notes (Signed)
No adverse effect from phenergan, pt states feels sleepy, however is still vomiting.

## 2011-01-12 NOTE — Progress Notes (Signed)
Pt states n/v x1 week, no bowel mvmt x1 week, having intense abdominal pain, rates 7-8/10. Vomiting yellow emesis.

## 2011-01-13 DIAGNOSIS — O0990 Supervision of high risk pregnancy, unspecified, unspecified trimester: Secondary | ICD-10-CM

## 2011-01-13 LAB — URINE CULTURE

## 2011-01-13 LAB — GLUCOSE, CAPILLARY: Glucose-Capillary: 79 mg/dL (ref 70–99)

## 2011-01-13 MED ORDER — METOCLOPRAMIDE HCL 10 MG PO TABS
10.0000 mg | ORAL_TABLET | Freq: Three times a day (TID) | ORAL | Status: DC
  Administered 2011-01-13 – 2011-01-14 (×3): 10 mg via ORAL
  Filled 2011-01-13 (×3): qty 1

## 2011-01-13 MED ORDER — BISACODYL 10 MG RE SUPP
10.0000 mg | Freq: Once | RECTAL | Status: AC
  Administered 2011-01-13: 10 mg via RECTAL

## 2011-01-13 MED ORDER — PANTOPRAZOLE SODIUM 20 MG PO TBEC
20.0000 mg | DELAYED_RELEASE_TABLET | Freq: Every day | ORAL | Status: DC
  Administered 2011-01-13 – 2011-01-14 (×2): 20 mg via ORAL
  Filled 2011-01-13 (×3): qty 1

## 2011-01-13 MED ORDER — SODIUM CHLORIDE 0.9 % IV SOLN
1.0000 g | Freq: Four times a day (QID) | INTRAVENOUS | Status: DC
  Administered 2011-01-13 – 2011-01-14 (×6): 1 g via INTRAVENOUS
  Filled 2011-01-13 (×8): qty 1000

## 2011-01-13 MED ORDER — ONDANSETRON HCL 4 MG PO TABS
8.0000 mg | ORAL_TABLET | Freq: Three times a day (TID) | ORAL | Status: DC | PRN
Start: 2011-01-13 — End: 2011-01-14
  Administered 2011-01-14: 8 mg via ORAL
  Filled 2011-01-13: qty 2

## 2011-01-13 MED ORDER — PROMETHAZINE HCL 25 MG PO TABS
12.5000 mg | ORAL_TABLET | ORAL | Status: DC | PRN
  Administered 2011-01-14: 12.5 mg via ORAL
  Filled 2011-01-13: qty 1

## 2011-01-13 NOTE — Progress Notes (Addendum)
Subjective: Patient reports nausea, vomiting and no problems voiding.  C/O dry mouth and requests popsicle. Reportedly has current dental abscess. Also reported constipation for one week.  Objective: I have reviewed patient's vital signs and medications.  General: alert, cooperative, fatigued and no distress GI: soft, non-tender; bowel sounds normal; no masses,  no organomegaly Extremities: extremities normal, atraumatic, no cyanosis or edema    Assessment/Plan: A:  IUP at [redacted]w[redacted]d      Hyperemesis      Dental Abscess      Constipation P:  Continue IV hydration and antiemetics      Ampicillin for abscess       Dulcolax suppository today      Will start clear liquids   LOS: 1 day    [facility] 01/13/2011, 7:31 AM

## 2011-01-14 LAB — GLUCOSE, CAPILLARY
Glucose-Capillary: 103 mg/dL — ABNORMAL HIGH (ref 70–99)
Glucose-Capillary: 106 mg/dL — ABNORMAL HIGH (ref 70–99)

## 2011-01-14 MED ORDER — PANTOPRAZOLE SODIUM 20 MG PO TBEC: 20.0000 mg | DELAYED_RELEASE_TABLET | Freq: Every day | ORAL | Status: DC

## 2011-01-14 MED ORDER — CALCIUM CARBONATE ANTACID 500 MG PO CHEW: 2.0000 | CHEWABLE_TABLET | ORAL | Status: DC | PRN

## 2011-01-14 MED ORDER — PRENATAL MULTIVITAMIN CH: 1.0000 | ORAL_TABLET | Freq: Every day | ORAL | Status: DC

## 2011-01-14 MED ORDER — PROMETHAZINE HCL 12.5 MG PO TABS: 12.5000 mg | ORAL_TABLET | ORAL | Status: DC | PRN

## 2011-01-14 MED ORDER — DSS 100 MG PO CAPS: 100.0000 mg | ORAL_CAPSULE | Freq: Every day | ORAL | Status: DC

## 2011-01-14 MED ORDER — BISACODYL 10 MG RE SUPP: 10.0000 mg | Freq: Every day | RECTAL | Status: DC | PRN

## 2011-01-14 MED ORDER — METOCLOPRAMIDE HCL 10 MG PO TABS: 10.0000 mg | ORAL_TABLET | Freq: Three times a day (TID) | ORAL | Status: DC

## 2011-01-14 MED ORDER — ONDANSETRON HCL 8 MG PO TABS: 8.0000 mg | ORAL_TABLET | Freq: Three times a day (TID) | ORAL | Status: DC | PRN

## 2011-01-14 NOTE — Progress Notes (Signed)
Subjective: Patient reports tolerating PO and + flatus.  She had small BM after SSE. Tolerating regular diet.   Objective: I have reviewed patient's vital signs, intake and output, medications and labs.  General: alert, cooperative and no distress Abd soft, NT c/w 16 wk size. DT FHR normal Results for orders placed during the hospital encounter of 01/12/11 (from the past 24 hour(s))  GLUCOSE, CAPILLARY     Status: Abnormal   Collection Time   01/13/11  6:00 PM      Component Value Range   Glucose-Capillary 103 (*) 70 - 99 (mg/dL)  GLUCOSE, CAPILLARY     Status: Abnormal   Collection Time   01/14/11  8:04 AM      Component Value Range   Glucose-Capillary 106 (*) 70 - 99 (mg/dL)    Assessment/Plan: Hyperemesis episode resolved Constipation improved II DM well controlled on current regimen  Discharge home with F/U at [facility] in 1 wk.  LOS: 2 days    Eloy Fehl 01/14/2011, 1:53 PM

## 2011-01-14 NOTE — Progress Notes (Signed)
UR Chart review completed.  

## 2011-01-14 NOTE — Discharge Summary (Signed)
Physician Discharge Summary  Patient ID: [name] MRN: [id number] DOB/AGE: 1980/12/31 30 y.o.  Admit date: 01/12/2011 Discharge date: 01/14/2011  Admission Diagnoses: IUP at [redacted]w[redacted]d, hyperemesis, dental abscess, diabetes  Discharge Diagnoses: IUP at [redacted]w[redacted]d, hyperemesis, dental abscess, diabetes  Patient Active Problem List  Diagnoses  . Type 2 diabetes mellitus  . Hyperemesis gravidarum before end of [redacted] week gestation, dehydration  . Chronic hypertension in pregnancy  . Depression  . Asthma  . H. pylori infection  . Supervision of high-risk pregnancy  Discharged Condition: stable  Hospital Course: Patient was admitted for IV hydration and antiemetic therapy after she failed outpatient management.  She had no further complications during her stay and was able to tolerate regular diet prior to discharge.  She was told to take her medications as prescribed.  Her blood sugars were stable during her hospitalization on her insulin regimen.  Consults: Social Work due to patient's social issues  Significant Diagnostic Studies: Labs Recent Results (from the past 168 hour(s))  CBC   Collection Time   01/12/11  7:43 AM      Component Value Range   WBC 8.3  4.0 - 10.5 (K/uL)   RBC 3.74 (*) 3.87 - 5.11 (MIL/uL)   Hemoglobin 9.6 (*) 12.0 - 15.0 (g/dL)   HCT 29.1 (*) 36.0 - 46.0 (%)   MCV 77.8 (*) 78.0 - 100.0 (fL)   MCH 25.7 (*) 26.0 - 34.0 (pg)   MCHC 33.0  30.0 - 36.0 (g/dL)   RDW 13.6  11.5 - 15.5 (%)   Platelets 292  150 - 400 (K/uL)  COMPREHENSIVE METABOLIC PANEL   Collection Time   01/12/11  7:43 AM      Component Value Range   Sodium 133 (*) 135 - 145 (mEq/L)   Potassium 3.7  3.5 - 5.1 (mEq/L)   Chloride 97  96 - 112 (mEq/L)   CO2 24  19 - 32 (mEq/L)   Glucose, Bld 125 (*) 70 - 99 (mg/dL)   BUN 11  6 - 23 (mg/dL)   Creatinine, Ser 1.12 (*) 0.50 - 1.10 (mg/dL)   Calcium 9.5  8.4 - 10.5 (mg/dL)   Total Protein 8.0  6.0 - 8.3 (g/dL)   Albumin 2.8 (*) 3.5 - 5.2  (g/dL)   AST 14  0 - 37 (U/L)   ALT 12  0 - 35 (U/L)   Alkaline Phosphatase 78  39 - 117 (U/L)   Total Bilirubin 0.2 (*) 0.3 - 1.2 (mg/dL)   GFR calc non Af Amer 64 (*) >90 (mL/min)   GFR calc Af Amer 74 (*) >90 (mL/min)  URINALYSIS, ROUTINE W REFLEX MICROSCOPIC   Collection Time   01/12/11  7:56 AM      Component Value Range   Color, Urine YELLOW  YELLOW    APPearance HAZY (*) CLEAR    Specific Gravity, Urine >1.030 (*) 1.005 - 1.030    pH 6.0  5.0 - 8.0    Glucose, UA NEGATIVE  NEGATIVE (mg/dL)   Hgb urine dipstick NEGATIVE  NEGATIVE    Bilirubin Urine MODERATE (*) NEGATIVE    Ketones, ur >80 (*) NEGATIVE (mg/dL)   Protein, ur 100 (*) NEGATIVE (mg/dL)   Urobilinogen, UA 1.0  0.0 - 1.0 (mg/dL)   Nitrite NEGATIVE  NEGATIVE    Leukocytes, UA TRACE (*) NEGATIVE   URINE MICROSCOPIC-ADD ON   Collection Time   01/12/11  7:56 AM      Component Value Range  Squamous Epithelial / LPF MANY (*) RARE    WBC, UA 3-6  <3 (WBC/hpf)   RBC / HPF 0-2  <3 (RBC/hpf)   Bacteria, UA MANY (*) RARE    Urine-Other RARE YEAST    URINE CULTURE   Collection Time   01/12/11  8:01 AM      Component Value Range   Specimen Description URINE, CLEAN CATCH     Special Requests NONE     Setup Time [id number]     Colony Count 60,000 COLONIES/ML     Culture       Value: Multiple bacterial morphotypes present, none predominant. Suggest appropriate recollection if clinically indicated.   Report Status 01/13/2011 FINAL    GLUCOSE, CAPILLARY   Collection Time   01/12/11  5:40 PM      Component Value Range   Glucose-Capillary 84  70 - 99 (mg/dL)  GLUCOSE, CAPILLARY   Collection Time   01/12/11  7:49 PM      Component Value Range   Glucose-Capillary 78  70 - 99 (mg/dL)  GLUCOSE, CAPILLARY   Collection Time   01/13/11  5:47 AM      Component Value Range   Glucose-Capillary 79  70 - 99 (mg/dL)  GLUCOSE, CAPILLARY   Collection Time   01/13/11  8:32 AM      Component Value Range   Glucose-Capillary  73  70 - 99 (mg/dL)  GLUCOSE, CAPILLARY   Collection Time   01/13/11 12:52 PM      Component Value Range   Glucose-Capillary 100 (*) 70 - 99 (mg/dL)  GLUCOSE, CAPILLARY   Collection Time   01/13/11  6:00 PM      Component Value Range   Glucose-Capillary 103 (*) 70 - 99 (mg/dL)  GLUCOSE, CAPILLARY   Collection Time   01/14/11  8:04 AM      Component Value Range   Glucose-Capillary 106 (*) 70 - 99 (mg/dL)   Treatments: IV hydration and antibiotics: amoxicillin and antiemetics  Discharge Exam: Blood pressure 110/72, pulse 79, temperature 99 F (37.2 C), temperature source Oral, resp. rate 18, height 5\' 6"  (1.676 m), weight 79.833 kg (176 lb), last menstrual period 09/19/2010, SpO2 97.00%. General appearance: alert and no distress Resp: clear to auscultation bilaterally Cardio: regular rate and rhythm GI: soft, non-tender; bowel sounds normal; no masses,  no organomegaly Pelvic: deferred Extremities: extremities normal, atraumatic, no cyanosis or edema  Disposition: Home or Self Care  Discharge Orders    Future Appointments: Provider: Department: Dept Phone: Center:   01/21/2011 8:45 AM [facility] [phone] [place]   01/29/2011 3:00 PM [facility] [place] [facility] [phone] [facility]     Future Orders Please Complete By Expires   Discharge patient      Comments:   To home     Medication List  As of 01/18/2011  4:57 PM   START taking these medications         bisacodyl 10 MG suppository   Commonly known as: DULCOLAX   Place 1 suppository (10 mg total) rectally daily as needed.      calcium carbonate 500 MG chewable tablet   Commonly known as: TUMS - dosed in mg elemental calcium   Chew 2 tablets (400 mg of elemental calcium total) by mouth every 4 (four) hours as needed.      DSS 100 MG Caps   Take 100 mg by mouth daily.      metoCLOPramide 10 MG tablet  Commonly known as: REGLAN   Take 1 tablet (10 mg total) by mouth 3 (three) times  daily before meals.      * ondansetron 8 MG tablet   Commonly known as: ZOFRAN   Take 1 tablet (8 mg total) by mouth every 8 (eight) hours as needed.      pantoprazole 20 MG tablet   Commonly known as: PROTONIX   Take 1 tablet (20 mg total) by mouth daily at 12 noon.      * prenatal multivitamin Tabs   Take 1 tablet by mouth daily.      * promethazine 12.5 MG tablet   Commonly known as: PHENERGAN   Take 1 tablet (12.5 mg total) by mouth every 4 (four) hours as needed.     * Notice: This list has 3 medication(s) that are the same as other medications prescribed for you. Read the directions carefully, and ask your doctor or other care provider to review them with you.       CONTINUE taking these medications         ACCU-CHEK FASTCLIX LANCETS Misc   1 Units by Percutaneous route 4 (four) times daily.      albuterol 108 (90 BASE) MCG/ACT inhaler   Commonly known as: PROVENTIL HFA;VENTOLIN HFA      clindamycin 150 MG capsule   Commonly known as: CLEOCIN   Take 1 capsule (150 mg total) by mouth 3 (three) times daily.      * glucose blood test strip   Use as instructed      * glucose blood test strip   Use as instructed to check blood sugars      HYDROcodone-acetaminophen 5-325 MG per tablet   Commonly known as: NORCO   Take 2 tablets by mouth every 4 (four) hours as needed for pain.      insulin aspart 100 UNIT/ML injection   Commonly known as: novoLOG   Inject 10 Units into the skin 3 (three) times daily before meals.      insulin NPH 100 UNIT/ML injection   Commonly known as: HUMULIN N,NOVOLIN N   Inject 15 Units into the skin 2 (two) times daily in the am and at bedtime..      * ondansetron 8 MG tablet   Commonly known as: ZOFRAN      * prenatal vitamin w/FE, FA 29-1 MG Chew      * promethazine 25 MG tablet   Commonly known as: PHENERGAN   Take 0.5 tablets (12.5 mg total) by mouth every 6 (six) hours as needed for nausea.     * Notice: This list has 5  medication(s) that are the same as other medications prescribed for you. Read the directions carefully, and ask your doctor or other care provider to review them with you.        Where to get your medications    These are the prescriptions that you need to pick up. We sent them to a specific pharmacy, so you will need to go there to get them.   [id number] - [place], [place] - [address] AT [facility] OF [address]    [address] [place]  [address]    Phone: [phone]        bisacodyl 10 MG suppository   calcium carbonate 500 MG chewable tablet   DSS 100 MG Caps   metoCLOPramide 10 MG tablet   ondansetron 8 MG tablet  pantoprazole 20 MG tablet   prenatal multivitamin Tabs   promethazine 12.5 MG tablet           Follow-up Information    Follow up with [facility]. Call in 1 week.         Signed: POE,DEIRDRE 01/14/2011, 4:57 PM  Agree with above. Verita Schneiders, M.D. 01/18/2011 5:10 PM

## 2011-01-14 NOTE — Progress Notes (Signed)
Subjective: Patient reports tolerating PO.  Reports continued constipation without relief with Dulcolax.   Objective: I have reviewed patient's vital signs, intake and output and medications.  General: alert, cooperative and no distress Resp: breathing normally on inspection Cardio: regular rate and rhythm, S1, S2 normal, no murmur, click, rub or gallop GI: soft, non-tender; bowel sounds normal; no masses,  no organomegaly Extremities: extremities normal, atraumatic, no cyanosis or edema   Assessment/Plan: [redacted]w[redacted]d with Hyperemesis Constipation   -Will give enema today and d/c home on Miralax  -Plan d/c today after lunch.  LOS: 2 days    Zariya Minner E. 01/14/2011, 6:28 AM

## 2011-01-14 NOTE — Progress Notes (Signed)
Patient given soap suds enema per order. Patient reported small amount of brown stool afterwards.

## 2011-01-19 ENCOUNTER — Emergency Department (HOSPITAL_COMMUNITY): Payer: Medicaid Other

## 2011-01-19 ENCOUNTER — Emergency Department (HOSPITAL_COMMUNITY)
Admission: EM | Admit: 2011-01-19 | Discharge: 2011-01-19 | Disposition: A | Discharge status: Home / Self Care | Payer: Medicaid Other | Attending: Emergency Medicine | Admitting: Emergency Medicine

## 2011-01-19 ENCOUNTER — Encounter (HOSPITAL_COMMUNITY): Payer: Self-pay | Admitting: Emergency Medicine

## 2011-01-19 DIAGNOSIS — E119 Type 2 diabetes mellitus without complications: Secondary | ICD-10-CM | POA: Insufficient documentation

## 2011-01-19 DIAGNOSIS — K5900 Constipation, unspecified: Secondary | ICD-10-CM | POA: Insufficient documentation

## 2011-01-19 DIAGNOSIS — Z79899 Other long term (current) drug therapy: Secondary | ICD-10-CM | POA: Insufficient documentation

## 2011-01-19 DIAGNOSIS — Z331 Pregnant state, incidental: Secondary | ICD-10-CM

## 2011-01-19 DIAGNOSIS — Z794 Long term (current) use of insulin: Secondary | ICD-10-CM | POA: Insufficient documentation

## 2011-01-19 DIAGNOSIS — O99019 Anemia complicating pregnancy, unspecified trimester: Secondary | ICD-10-CM | POA: Insufficient documentation

## 2011-01-19 DIAGNOSIS — D649 Anemia, unspecified: Secondary | ICD-10-CM

## 2011-01-19 DIAGNOSIS — J45909 Unspecified asthma, uncomplicated: Secondary | ICD-10-CM | POA: Insufficient documentation

## 2011-01-19 DIAGNOSIS — I10 Essential (primary) hypertension: Secondary | ICD-10-CM | POA: Insufficient documentation

## 2011-01-19 DIAGNOSIS — F341 Dysthymic disorder: Secondary | ICD-10-CM | POA: Insufficient documentation

## 2011-01-19 LAB — COMPREHENSIVE METABOLIC PANEL
BUN: 7 mg/dL (ref 6–23)
CO2: 25 mEq/L (ref 19–32)
Calcium: 9.3 mg/dL (ref 8.4–10.5)
Creatinine, Ser: 1.04 mg/dL (ref 0.50–1.10)
GFR calc Af Amer: 81 mL/min — ABNORMAL LOW (ref >90)
GFR calc non Af Amer: 70 mL/min — ABNORMAL LOW (ref >90)
Glucose, Bld: 112 mg/dL — ABNORMAL HIGH (ref 70–99)

## 2011-01-19 LAB — LIPASE, BLOOD: Lipase: 21 U/L (ref 11–59)

## 2011-01-19 LAB — CBC
HCT: 25.8 % — ABNORMAL LOW (ref 36.0–46.0)
MCH: 25.7 pg — ABNORMAL LOW (ref 26.0–34.0)
MCV: 77 fL — ABNORMAL LOW (ref 78.0–100.0)
RBC: 3.35 MIL/uL — ABNORMAL LOW (ref 3.87–5.11)
WBC: 6.4 10*3/uL (ref 4.0–10.5)

## 2011-01-19 LAB — POCT PREGNANCY, URINE: Preg Test, Ur: POSITIVE

## 2011-01-19 LAB — DIFFERENTIAL
Eosinophils Absolute: 0.1 10*3/uL (ref 0.0–0.7)
Eosinophils Relative: 2 % (ref 0–5)
Lymphocytes Relative: 36 % (ref 12–46)
Lymphs Abs: 2.3 10*3/uL (ref 0.7–4.0)
Monocytes Absolute: 0.5 10*3/uL (ref 0.1–1.0)

## 2011-01-19 MED ORDER — LACTULOSE 10 GM/15ML PO SOLN
30.0000 g | Freq: Once | ORAL | Status: DC
  Filled 2011-01-19: qty 45

## 2011-01-19 MED ORDER — ALUM & MAG HYDROXIDE-SIMETH 200-200-20 MG/5ML PO SUSP
30.0000 mL | Freq: Once | ORAL | Status: AC
  Administered 2011-01-19: 30 mL via ORAL
  Filled 2011-01-19: qty 30

## 2011-01-19 MED ORDER — FLEET ENEMA 7-19 GM/118ML RE ENEM
1.0000 | ENEMA | Freq: Once | RECTAL | Status: AC
  Administered 2011-01-19: 1 via RECTAL
  Filled 2011-01-19: qty 1

## 2011-01-19 MED ORDER — LACTULOSE 10 GM/15ML PO SOLN
10.0000 g | Freq: Once | ORAL | Status: AC
  Administered 2011-01-19: 10 g via ORAL

## 2011-01-19 MED ORDER — LACTULOSE 10 GM/15ML PO SOLN: 15.0000 mL | Freq: Two times a day (BID) | ORAL | Status: DC

## 2011-01-19 NOTE — ED Notes (Signed)
Pharmacy called, states they will send lactulose up now.

## 2011-01-19 NOTE — ED Notes (Signed)
Pt is [redacted] weeks pregnant, June 8th is due date

## 2011-01-19 NOTE — ED Notes (Signed)
Sandwich and beverage to pt.

## 2011-01-19 NOTE — L&D Delivery Note (Signed)
Delivery Note At 11:11 PM a viable and healthy female was delivered via Vaginal, Spontaneous Delivery (Presentation: ;  ).  APGAR: 9 & 9; weight 5lb 11.5oz .   Placenta status:  intact ,.  Cord:  with the following complications - none: .  Cord pH: n/a  Anesthesia:  Epidural Episiotomy: None Lacerations: Left labial Suture Repair: n/a Est. Blood Loss (mL): 400  Mom to postpartum.  Baby to nursery-stable.  [facility] 06/19/2011, 11:47 PM

## 2011-01-19 NOTE — ED Provider Notes (Addendum)
History     CSN: [id number]  Arrival date & time 01/19/11  87   First MD Initiated Contact with Patient 01/19/11 1557      Chief Complaint  Patient presents with  . Constipation   patient is gravida 4, para 2, with one previous ectopic pregnancy, not requiring surgery. She is [redacted] weeks pregnant. Apparently, a high risk pregnancy secondary to hyperemesis, type 2 diabetes, chronic hypertension. States no bowel movement in 2 weeks. She has been using suppositories, as well as MiraLAX, as well as tried one enema at home. Apparently, patient was previously admitted recently for emesis during pregnancy. She denies any vomiting at this time. Denies any fevers. The abdominal pain is diffuse and crampy consistent with previous constipation. Patient also has a history of asthma, depression, and anxiety.  (Consider location/radiation/quality/duration/timing/severity/associated sxs/prior treatment) HPI  Past Medical History  Diagnosis Date  . Asthma   . Diabetes mellitus   . Mental disorder   . Depression   . Anxiety   . Hypertension     Past Surgical History  Procedure Date  . No past surgeries     Family History  Problem Relation Age of Onset  . Heart disease Mother     CHF  . Anesthesia problems Neg Hx     History  Substance Use Topics  . Smoking status: Never Smoker   . Smokeless tobacco: Never Used  . Alcohol Use: No     not with preg    OB History    Grav Para Term Preterm Abortions TAB SAB Ect Mult Living   4 2 1 1 1  0 0 1 0 2      Review of Systems  All other systems reviewed and are negative.    Allergies  Morphine and related  Home Medications   Current Outpatient Rx  Name Route Sig Dispense Refill  . ACCU-CHEK FASTCLIX LANCETS MISC Percutaneous 1 Units by Percutaneous route 4 (four) times daily. 100 each 12  . ALBUTEROL SULFATE HFA 108 (90 BASE) MCG/ACT IN AERS Inhalation Inhale 2 puffs into the lungs every 6 (six) hours as needed. For shortness of  breath    . BISACODYL 10 MG RE SUPP Rectal Place 1 suppository (10 mg total) rectally daily as needed. 12 suppository 0  . CALCIUM CARBONATE ANTACID 500 MG PO CHEW Oral Chew 2 tablets (400 mg of elemental calcium total) by mouth every 4 (four) hours as needed. 30 tablet 1  . CLINDAMYCIN HCL 150 MG PO CAPS Oral Take 1 capsule (150 mg total) by mouth 3 (three) times daily. 21 capsule 0  . DSS 100 MG PO CAPS Oral Take 100 mg by mouth daily. 10 capsule 0  . GLUCOSE BLOOD VI STRP  Use as instructed to check blood sugars 100 each 12  . GLUCOSE BLOOD VI STRP  Use as instructed 100 each 12  . HYDROCODONE-ACETAMINOPHEN 5-325 MG PO TABS Oral Take 2 tablets by mouth every 4 (four) hours as needed for pain. 10 tablet 0  . INSULIN ASPART 100 UNIT/ML [place] SOLN Subcutaneous Inject 10 Units into the skin 3 (three) times daily before meals. 10 mL 2  . INSULIN ISOPHANE HUMAN 100 UNIT/ML [place] SUSP Subcutaneous Inject 15 Units into the skin 2 (two) times daily in the am and at bedtime.. 10 mL 2  . METOCLOPRAMIDE HCL 10 MG PO TABS Oral Take 1 tablet (10 mg total) by mouth 3 (three) times daily before meals. 20 tablet 1  . ONDANSETRON HCL  8 MG PO TABS Oral Take 8 mg by mouth every 8 (eight) hours as needed. For nausea    . ONDANSETRON HCL 8 MG PO TABS Oral Take 1 tablet (8 mg total) by mouth every 8 (eight) hours as needed. 20 tablet 1  . PANTOPRAZOLE SODIUM 20 MG PO TBEC Oral Take 1 tablet (20 mg total) by mouth daily at 12 noon. 20 tablet 1  . PRENATAL MULTIVITAMIN CH Oral Take 1 tablet by mouth daily. 30 tablet 5  . COMPLETENATE 29-1 MG PO CHEW Oral Chew 1 tablet by mouth daily.      [place] PROMETHAZINE HCL 12.5 MG PO TABS Oral Take 1 tablet (12.5 mg total) by mouth every 4 (four) hours as needed. 30 tablet 1    BP 120/77  Pulse 115  Temp(Src) 97.2 F (36.2 C) (Oral)  Resp 18  SpO2 100%  LMP 09/19/2010  Physical Exam  Nursing note and vitals reviewed. Constitutional: She is oriented to person, place, and time. She  appears well-developed and well-nourished.  HENT:  Head: Normocephalic and atraumatic.  Eyes: Conjunctivae and EOM are normal. Pupils are equal, round, and reactive to light.  Neck: Neck supple.  Cardiovascular: Normal rate and regular rhythm.  Exam reveals no gallop and no friction rub.   No murmur heard. Pulmonary/Chest: Breath sounds normal. She has no wheezes. She has no rales. She exhibits no tenderness.  Abdominal: Soft. Bowel sounds are normal. She exhibits no distension. There is no tenderness. There is no rebound and no guarding.  Musculoskeletal: Normal range of motion.  Neurological: She is alert and oriented to person, place, and time. No cranial nerve deficit. Coordination normal.  Skin: Skin is warm and dry. No rash noted.  Psychiatric: She has a normal mood and affect.    ED Course  Procedures (including critical care time)   Labs Reviewed  CBC  DIFFERENTIAL  COMPREHENSIVE METABOLIC PANEL  LIPASE, BLOOD  PREGNANCY, URINE   No results found.   No diagnosis found.    MDM  Pt is seen and examined;  Initial history and physical completed.  Will follow.     Will need to consent to abd xray.  Risks, benefits, alternatives explained.    Results for orders placed during the hospital encounter of 01/19/11  POCT PREGNANCY, URINE      Component Value Range   Preg Test, Ur POSITIVE     Dg Abd 1 View  01/19/2011  *RADIOLOGY REPORT*  Clinical Data: No bowel movement for 2 weeks.  Not passing gas. Severe abdominal pain.  [redacted] weeks pregnant.  EDC is 06/26/2011. Verbal consent for exam.  ABDOMEN - 1 VIEW  Comparison: 12/07/2010  Findings: There is moderate stool throughout nondilated loops of colon within the upper and lower abdomen.  No evidence for small bowel obstruction, free intraperitoneal air.  No evidence for organomegaly or abnormal calcifications.  Small probable phleboliths are identified in the right hemi pelvis.  IMPRESSION: Moderate stool burden.   Nonobstructive bowel gas pattern.  Original Report Authenticated By: Glenice Bow, M.D.         7:06 PM Discussed briefly with the high-risk obstetrician, fellow, Dr. Nehemiah Settle. He recommends lactulose for the constipation with a prescription for the same. He is also recommending just a bedside Doppler to pick up fetal heart tones, but did not recommend formal ultrasound. Otherwise, if normal patient can be discharged home with outpatient followup.   7:43 PM Bedside ultrasound done by myself, and the resident. Fetal  heart rate was 167 is calculated. Vigorous fetal movement. Reassuring findings. Patient is requesting a sandwich and will be stable for discharge home. She has an appointment with her obstetrician on Thursday. She was encouraged to return to the ED at any time for concerns. Patient will be prescribed lactulose as recommended by her obstetrician  Collier Salina A. Lauris Poag, MD 01/19/11 1944   Results for orders placed during the hospital encounter of 01/19/11  CBC      Component Value Range   WBC 6.4  4.0 - 10.5 (K/uL)   RBC 3.35 (*) 3.87 - 5.11 (MIL/uL)   Hemoglobin 8.6 (*) 12.0 - 15.0 (g/dL)   HCT 25.8 (*) 36.0 - 46.0 (%)   MCV 77.0 (*) 78.0 - 100.0 (fL)   MCH 25.7 (*) 26.0 - 34.0 (pg)   MCHC 33.3  30.0 - 36.0 (g/dL)   RDW 13.6  11.5 - 15.5 (%)   Platelets 270  150 - 400 (K/uL)  DIFFERENTIAL      Component Value Range   Neutrophils Relative 55  43 - 77 (%)   Neutro Abs 3.5  1.7 - 7.7 (K/uL)   Lymphocytes Relative 36  12 - 46 (%)   Lymphs Abs 2.3  0.7 - 4.0 (K/uL)   Monocytes Relative 8  3 - 12 (%)   Monocytes Absolute 0.5  0.1 - 1.0 (K/uL)   Eosinophils Relative 2  0 - 5 (%)   Eosinophils Absolute 0.1  0.0 - 0.7 (K/uL)   Basophils Relative 0  0 - 1 (%)   Basophils Absolute 0.0  0.0 - 0.1 (K/uL)  COMPREHENSIVE METABOLIC PANEL      Component Value Range   Sodium 136  135 - 145 (mEq/L)   Potassium 3.8  3.5 - 5.1 (mEq/L)   Chloride 102  96 - 112 (mEq/L)   CO2 25  19  - 32 (mEq/L)   Glucose, Bld 112 (*) 70 - 99 (mg/dL)   BUN 7  6 - 23 (mg/dL)   Creatinine, Ser 1.04  0.50 - 1.10 (mg/dL)   Calcium 9.3  8.4 - 10.5 (mg/dL)   Total Protein 6.9  6.0 - 8.3 (g/dL)   Albumin 2.4 (*) 3.5 - 5.2 (g/dL)   AST 9  0 - 37 (U/L)   ALT 6  0 - 35 (U/L)   Alkaline Phosphatase 60  39 - 117 (U/L)   Total Bilirubin 0.2 (*) 0.3 - 1.2 (mg/dL)   GFR calc non Af Amer 70 (*) >90 (mL/min)   GFR calc Af Amer 81 (*) >90 (mL/min)  LIPASE, BLOOD      Component Value Range   Lipase 21  11 - 59 (U/L)  POCT PREGNANCY, URINE      Component Value Range   Preg Test, Ur POSITIVE     Dg Abd 1 View  01/19/2011  *RADIOLOGY REPORT*  Clinical Data: No bowel movement for 2 weeks.  Not passing gas. Severe abdominal pain.  [redacted] weeks pregnant.  EDC is 06/26/2011. Verbal consent for exam.  ABDOMEN - 1 VIEW  Comparison: 12/07/2010  Findings: There is moderate stool throughout nondilated loops of colon within the upper and lower abdomen.  No evidence for small bowel obstruction, free intraperitoneal air.  No evidence for organomegaly or abnormal calcifications.  Small probable phleboliths are identified in the right hemi pelvis.  IMPRESSION: Moderate stool burden.  Nonobstructive bowel gas pattern.  Original Report Authenticated By: Glenice Bow, M.D.      Ashir Kunz A. Lauris Poag,  MD 01/19/11 1953

## 2011-01-19 NOTE — ED Notes (Signed)
constipatrion and abdominal pain for two weeks.  Pt states she has not had a bowel movement in 2 weeks

## 2011-01-19 NOTE — ED Notes (Signed)
rx x 1, pt voiced understanding to f/u with OB on Thursday and take lactulose as prescribed.

## 2011-01-21 ENCOUNTER — Ambulatory Visit (INDEPENDENT_AMBULATORY_CARE_PROVIDER_SITE_OTHER): Payer: Medicaid Other | Admitting: Family Medicine

## 2011-01-21 DIAGNOSIS — O24919 Unspecified diabetes mellitus in pregnancy, unspecified trimester: Secondary | ICD-10-CM

## 2011-01-21 DIAGNOSIS — O10019 Pre-existing essential hypertension complicating pregnancy, unspecified trimester: Secondary | ICD-10-CM

## 2011-01-21 DIAGNOSIS — O211 Hyperemesis gravidarum with metabolic disturbance: Secondary | ICD-10-CM

## 2011-01-21 LAB — POCT URINALYSIS DIP (DEVICE)
Glucose, UA: NEGATIVE mg/dL
Hgb urine dipstick: NEGATIVE
Leukocytes, UA: NEGATIVE
Nitrite: NEGATIVE
Urobilinogen, UA: 1 mg/dL (ref 0.0–1.0)

## 2011-01-21 NOTE — Patient Instructions (Signed)
Pregnancy - Second Trimester The second trimester of pregnancy (3 to 6 months) is a period of rapid growth for you and your baby. At the end of the sixth month, your baby is about 9 inches long and weighs 1 1/2 pounds. You will begin to feel the baby move between 18 and 20 weeks of the pregnancy. This is called quickening. Weight gain is faster. A clear fluid (colostrum) may leak out of your breasts. You may feel small contractions of the womb (uterus). This is known as false labor or Braxton-Hicks contractions. This is like a practice for labor when the baby is ready to be born. Usually, the problems with morning sickness have usually passed by the end of your first trimester. Some women develop small dark blotches (called cholasma, mask of pregnancy) on their face that usually goes away after the baby is born. Exposure to the sun makes the blotches worse. Acne may also develop in some pregnant women and pregnant women who have acne, may find that it goes away. PRENATAL EXAMS  Blood work may continue to be done during prenatal exams. These tests are done to check on your health and the probable health of your baby. Blood work is used to follow your blood levels (hemoglobin). Anemia (low hemoglobin) is common during pregnancy. Iron and vitamins are given to help prevent this. You will also be checked for diabetes between 24 and 28 weeks of the pregnancy. Some of the previous blood tests may be repeated.   The size of the uterus is measured during each visit. This is to make sure that the baby is continuing to grow properly according to the dates of the pregnancy.   Your blood pressure is checked every prenatal visit. This is to make sure you are not getting toxemia.   Your urine is checked to make sure you do not have an infection, diabetes or protein in the urine.   Your weight is checked often to make sure gains are happening at the suggested rate. This is to ensure that both you and your baby are  growing normally.   Sometimes, an ultrasound is performed to confirm the proper growth and development of the baby. This is a test which bounces harmless sound waves off the baby so your caregiver can more accurately determine due dates.  Sometimes, a specialized test is done on the amniotic fluid surrounding the baby. This test is called an amniocentesis. The amniotic fluid is obtained by sticking a needle into the belly (abdomen). This is done to check the chromosomes in instances where there is a concern about possible genetic problems with the baby. It is also sometimes done near the end of pregnancy if an early delivery is required. In this case, it is done to help make sure the baby's lungs are mature enough for the baby to live outside of the womb. CHANGES OCCURING IN THE SECOND TRIMESTER OF PREGNANCY Your body goes through many changes during pregnancy. They vary from person to person. Talk to your caregiver about changes you notice that you are concerned about.  During the second trimester, you will likely have an increase in your appetite. It is normal to have cravings for certain foods. This varies from person to person and pregnancy to pregnancy.   Your lower abdomen will begin to bulge.   You may have to urinate more often because the uterus and baby are pressing on your bladder. It is also common to get more bladder infections during pregnancy (  pain with urination). You can help this by drinking lots of fluids and emptying your bladder before and after intercourse.   You may begin to get stretch marks on your hips, abdomen, and breasts. These are normal changes in the body during pregnancy. There are no exercises or medications to take that prevent this change.   You may begin to develop swollen and bulging veins (varicose veins) in your legs. Wearing support hose, elevating your feet for 15 minutes, 3 to 4 times a day and limiting salt in your diet helps lessen the problem.    Heartburn may develop as the uterus grows and pushes up against the stomach. Antacids recommended by your caregiver helps with this problem. Also, eating smaller meals 4 to 5 times a day helps.   Constipation can be treated with a stool softener or adding bulk to your diet. Drinking lots of fluids, vegetables, fruits, and whole grains are helpful.   Exercising is also helpful. If you have been very active up until your pregnancy, most of these activities can be continued during your pregnancy. If you have been less active, it is helpful to start an exercise program such as walking.   Hemorrhoids (varicose veins in the rectum) may develop at the end of the second trimester. Warm sitz baths and hemorrhoid cream recommended by your caregiver helps hemorrhoid problems.   Backaches may develop during this time of your pregnancy. Avoid heavy lifting, wear low heal shoes and practice good posture to help with backache problems.   Some pregnant women develop tingling and numbness of their hand and fingers because of swelling and tightening of ligaments in the wrist (carpel tunnel syndrome). This goes away after the baby is born.   As your breasts enlarge, you may have to get a bigger bra. Get a comfortable, cotton, support bra. Do not get a nursing bra until the last month of the pregnancy if you will be nursing the baby.   You may get a dark line from your belly button to the pubic area called the linea nigra.   You may develop rosy cheeks because of increase blood flow to the face.   You may develop spider looking lines of the face, neck, arms and chest. These go away after the baby is born.  HOME CARE INSTRUCTIONS   It is extremely important to avoid all smoking, herbs, alcohol, and unprescribed drugs during your pregnancy. These chemicals affect the formation and growth of the baby. Avoid these chemicals throughout the pregnancy to ensure the delivery of a healthy infant.   Most of your home  care instructions are the same as suggested for the first trimester of your pregnancy. Keep your caregiver's appointments. Follow your caregiver's instructions regarding medication use, exercise and diet.   During pregnancy, you are providing food for you and your baby. Continue to eat regular, well-balanced meals. Choose foods such as meat, fish, milk and other low fat dairy products, vegetables, fruits, and whole-grain breads and cereals. Your caregiver will tell you of the ideal weight gain.   A physical sexual relationship may be continued up until near the end of pregnancy if there are no other problems. Problems could include early (premature) leaking of amniotic fluid from the membranes, vaginal bleeding, abdominal pain, or other medical or pregnancy problems.   Exercise regularly if there are no restrictions. Check with your caregiver if you are unsure of the safety of some of your exercises. The greatest weight gain will occur in the   last 2 trimesters of pregnancy. Exercise will help you:   Control your weight.   Get you in shape for labor and delivery.   Lose weight after you have the baby.   Wear a good support or jogging bra for breast tenderness during pregnancy. This may help if worn during sleep. Pads or tissues may be used in the bra if you are leaking colostrum.   Do not use hot tubs, steam rooms or saunas throughout the pregnancy.   Wear your seat belt at all times when driving. This protects you and your baby if you are in an accident.   Avoid raw meat, uncooked cheese, cat litter boxes and soil used by cats. These carry germs that can cause birth defects in the baby.   The second trimester is also a good time to visit your dentist for your dental health if this has not been done yet. Getting your teeth cleaned is OK. Use a soft toothbrush. Brush gently during pregnancy.   It is easier to loose urine during pregnancy. Tightening up and strengthening the pelvic muscles will  help with this problem. Practice stopping your urination while you are going to the bathroom. These are the same muscles you need to strengthen. It is also the muscles you would use as if you were trying to stop from passing gas. You can practice tightening these muscles up 10 times a set and repeating this about 3 times per day. Once you know what muscles to tighten up, do not perform these exercises during urination. It is more likely to contribute to an infection by backing up the urine.   Ask for help if you have financial, counseling or nutritional needs during pregnancy. Your caregiver will be able to offer counseling for these needs as well as refer you for other special needs.   Your skin may become oily. If so, wash your face with mild soap, use non-greasy moisturizer and oil or cream based makeup.  MEDICATIONS AND DRUG USE IN PREGNANCY  Take prenatal vitamins as directed. The vitamin should contain 1 milligram of folic acid. Keep all vitamins out of reach of children. Only a couple vitamins or tablets containing iron may be fatal to a baby or young child when ingested.   Avoid use of all medications, including herbs, over-the-counter medications, not prescribed or suggested by your caregiver. Only take over-the-counter or prescription medicines for pain, discomfort, or fever as directed by your caregiver. Do not use aspirin.   Let your caregiver also know about herbs you may be using.   Alcohol is related to a number of birth defects. This includes fetal alcohol syndrome. All alcohol, in any form, should be avoided completely. Smoking will cause low birth rate and premature babies.   Street or illegal drugs are very harmful to the baby. They are absolutely forbidden. A baby born to an addicted mother will be addicted at birth. The baby will go through the same withdrawal an adult does.  SEEK MEDICAL CARE IF:  You have any concerns or worries during your pregnancy. It is better to call with  your questions if you feel they cannot wait, rather than worry about them. SEEK IMMEDIATE MEDICAL CARE IF:   An unexplained oral temperature above 102 F (38.9 C) develops, or as your caregiver suggests.   You have leaking of fluid from the vagina (birth canal). If leaking membranes are suspected, take your temperature and tell your caregiver of this when you call.   There   is vaginal spotting, bleeding, or passing clots. Tell your caregiver of the amount and how many pads are used. Light spotting in pregnancy is common, especially following intercourse.   You develop a bad smelling vaginal discharge with a change in the color from clear to white.   You continue to feel sick to your stomach (nauseated) and have no relief from remedies suggested. You vomit blood or coffee ground-like materials.   You lose more than 2 pounds of weight or gain more than 2 pounds of weight over 1 week, or as suggested by your caregiver.   You notice swelling of your face, hands, feet, or legs.   You get exposed to German measles and have never had them.   You are exposed to fifth disease or chickenpox.   You develop belly (abdominal) pain. Round ligament discomfort is a common non-cancerous (benign) cause of abdominal pain in pregnancy. Your caregiver still must evaluate you.   You develop a bad headache that does not go away.   You develop fever, diarrhea, pain with urination, or shortness of breath.   You develop visual problems, blurry, or double vision.   You fall or are in a car accident or any kind of trauma.   There is mental or physical violence at home.  Document Released: 12/29/2000 Document Revised: 09/16/2010 Document Reviewed: 07/03/2008 [employer] Patient Information 2012 [employer]. 

## 2011-01-21 NOTE — Progress Notes (Signed)
Doing well, tolerating some foots.  Still having some nausea and vomiting, but improved overall.  Was seen in [facility] ED for constipation, was started on lactulose.  Starting to have some stools.  No other complaints.  Did not bring log book, but reports that her blood sugars are < 90 fasting and < 120 2hr PP.  F/U in 2 weeks.

## 2011-01-24 ENCOUNTER — Emergency Department (HOSPITAL_COMMUNITY)
Admission: EM | Admit: 2011-01-24 | Discharge: 2011-01-25 | Disposition: A | Discharge status: Home / Self Care | Payer: Medicaid Other | Source: Home / Self Care | Attending: Emergency Medicine | Admitting: Emergency Medicine

## 2011-01-24 DIAGNOSIS — K219 Gastro-esophageal reflux disease without esophagitis: Secondary | ICD-10-CM

## 2011-01-24 DIAGNOSIS — O219 Vomiting of pregnancy, unspecified: Secondary | ICD-10-CM

## 2011-01-24 LAB — URINE MICROSCOPIC-ADD ON

## 2011-01-24 LAB — GLUCOSE, CAPILLARY: Glucose-Capillary: 104 mg/dL — ABNORMAL HIGH (ref 70–99)

## 2011-01-24 LAB — URINALYSIS, ROUTINE W REFLEX MICROSCOPIC
Bilirubin Urine: NEGATIVE
Hgb urine dipstick: NEGATIVE
Specific Gravity, Urine: 1.01 (ref 1.005–1.030)
Urobilinogen, UA: 0.2 mg/dL (ref 0.0–1.0)

## 2011-01-24 MED ORDER — ONDANSETRON HCL 4 MG/2ML IJ SOLN
4.0000 mg | Freq: Once | INTRAMUSCULAR | Status: AC
  Administered 2011-01-25: 4 mg via INTRAVENOUS
  Filled 2011-01-24 (×3): qty 2

## 2011-01-24 MED ORDER — GI COCKTAIL ~~LOC~~
30.0000 mL | Freq: Once | ORAL | Status: AC
  Administered 2011-01-25: 30 mL via ORAL
  Filled 2011-01-24: qty 30

## 2011-01-24 MED ORDER — SODIUM CHLORIDE 0.9 % IV BOLUS (SEPSIS)
1000.0000 mL | Freq: Once | INTRAVENOUS | Status: AC
  Administered 2011-01-25: 1000 mL via INTRAVENOUS

## 2011-01-24 MED ORDER — CALCIUM CARBONATE ANTACID 500 MG PO CHEW
2.0000 | CHEWABLE_TABLET | Freq: Once | ORAL | Status: AC
  Administered 2011-01-25: 400 mg via ORAL
  Filled 2011-01-24: qty 2

## 2011-01-24 MED ORDER — ONDANSETRON HCL 4 MG/2ML IJ SOLN
INTRAMUSCULAR | Status: AC
  Administered 2011-01-25: 4 mg via INTRAVENOUS
  Filled 2011-01-24: qty 2

## 2011-01-24 NOTE — ED Notes (Signed)
Pt states that she is [redacted] weeks pregnant and has been diagnosed with hyperemesis gravidarum.  Pt crying and complaining of heart burn and indigestion.  Pt states that her prenatal course is complicated by diabetes, last CBG was just prior to coming to [facility] and was 128.

## 2011-01-24 NOTE — ED Provider Notes (Signed)
History     CSN: [id number]  Arrival date & time 01/24/11  1903   First MD Initiated Contact with Patient 01/24/11 2118      Chief Complaint  Patient presents with  . Nausea  . Hematemesis    (Consider location/radiation/quality/duration/timing/severity/associated sxs/prior treatment) HPI Comments: Patient here who is para 4 gravida 2, [redacted] weeks pregnant who presents with complaints of 2 day history of nausea and vomiting - states has vomited multiple times per day but came in today because of have bright red blood in the emesis - she complains of epigastric abdominal pain and burning - states she did not call the high risk pregnancy clinic (who which she is seeing for uncontrolled hypertension and IDDM) - states that she has not taken any medication at home for this. Reports no diarrhea, vaginal discharge or bleeding.  Patient is a 31 y.o. female presenting with vomiting. The history is provided by the patient. No language interpreter was used.  Emesis  This is a new problem. The current episode started 2 days ago. The problem occurs 5 to 10 times per day. The problem has been gradually worsening. The emesis has an appearance of stomach contents and bright red blood. There has been no fever. Associated symptoms include abdominal pain. Pertinent negatives include no arthralgias, no chills, no cough, no diarrhea, no fever, no headaches, no myalgias, no sweats and no URI.    Past Medical History  Diagnosis Date  . Asthma   . Diabetes mellitus   . Mental disorder   . Depression   . Anxiety   . Hypertension     Past Surgical History  Procedure Date  . No past surgeries     Family History  Problem Relation Age of Onset  . Heart disease Mother     CHF  . Anesthesia problems Neg Hx     History  Substance Use Topics  . Smoking status: Never Smoker   . Smokeless tobacco: Never Used  . Alcohol Use: No     not with preg    OB History    Grav Para Term Preterm Abortions TAB  SAB Ect Mult Living   4 2 1 1 1  0 0 1 0 2      Review of Systems  Constitutional: Negative for fever and chills.  Respiratory: Negative for cough.   Gastrointestinal: Positive for vomiting and abdominal pain. Negative for diarrhea.  Genitourinary: Negative for decreased urine volume, vaginal bleeding, vaginal pain and pelvic pain.  Musculoskeletal: Negative for myalgias and arthralgias.  Neurological: Negative for headaches.  Psychiatric/Behavioral: Positive for agitation.  All other systems reviewed and are negative.    Allergies  Morphine and related  Home Medications   Current Outpatient Rx  Name Route Sig Dispense Refill  . ALBUTEROL SULFATE HFA 108 (90 BASE) MCG/ACT IN AERS Inhalation Inhale 2 puffs into the lungs every 6 (six) hours as needed. For shortness of breath    . CALCIUM CARBONATE ANTACID 500 MG PO CHEW Oral Chew 2 tablets by mouth daily.      . INSULIN ASPART 100 UNIT/ML [place] SOLN Subcutaneous Inject 10 Units into the skin 3 (three) times daily before meals. 10 mL 2  . INSULIN ISOPHANE HUMAN 100 UNIT/ML  SUSP Subcutaneous Inject 15 Units into the skin 2 (two) times daily.      Marland Kitchen FLINTSTONES/EXTRA C PO CHEW Oral Chew 1 tablet by mouth daily.      Marland Kitchen ONDANSETRON HCL 8 MG PO TABS Oral  Take 8 mg by mouth every 8 (eight) hours as needed. For nausea    . PANTOPRAZOLE SODIUM 20 MG PO TBEC Oral Take 20 mg by mouth daily at 12 noon.      Marland Kitchen PRENATAL MULTIVITAMIN CH Oral Take 1 tablet by mouth daily.        BP 132/81  Pulse 85  Temp(Src) 98.7 F (37.1 C) (Oral)  Resp 18  SpO2 100%  LMP 09/19/2010  Physical Exam  Nursing note and vitals reviewed. Constitutional: She is oriented to person, place, and time. She appears well-developed and well-nourished. No distress.  HENT:  Head: Normocephalic and atraumatic.  Right Ear: External ear normal.  Left Ear: External ear normal.  Nose: Nose normal.  Mouth/Throat: Oropharynx is clear and moist. No oropharyngeal exudate.    Eyes: Conjunctivae are normal. Pupils are equal, round, and reactive to light. No scleral icterus.  Neck: Normal range of motion. Neck supple.  Cardiovascular: Normal rate, regular rhythm and normal heart sounds.  Exam reveals no gallop and no friction rub.   No murmur heard. Pulmonary/Chest: Effort normal and breath sounds normal. No respiratory distress. She has no wheezes.  Abdominal: Soft. Bowel sounds are normal. She exhibits no distension. There is no tenderness.  Musculoskeletal: Normal range of motion.  Neurological: She is alert and oriented to person, place, and time.  Skin: Skin is warm and dry.  Psychiatric: Her speech is normal. Her affect is angry and labile. She is agitated and aggressive.    ED Course  Procedures (including critical care time)  Labs Reviewed  GLUCOSE, CAPILLARY - Abnormal; Notable for the following:    Glucose-Capillary 104 (*)    All other components within normal limits  POCT CBG MONITORING  URINALYSIS, ROUTINE W REFLEX MICROSCOPIC   No results found.   Nausea and vomiting in pregnancy GERD   MDM  I have checked on the patient multiple times during the evening.  She has been able to tolerate po fluids but continues to complain of epigastric pain and burning.  I have offered Tums and GI cocktail wtihout relief - we have not noted any vomiting while the patient is here.  She is afebrile and non-toxic appearing without signs of dehydration, fetal heart tones are 150 and re-assuring.  She has asked for "anxiety" medication but I have refused to give this.  She is asking to be transferred to [facility], but I see no reason for admission to the hospital.  I will allow the patient to be discharged and she may follow up with her OB this morning.        Idalia Needle [place], [place] 01/25/11 0505

## 2011-01-24 NOTE — ED Notes (Signed)
Pt brought by ems with c/o n/v x 2 days and abd pain starting today with bright red blood in emesis

## 2011-01-24 NOTE — ED Notes (Signed)
[id number] Expected date:01/24/11<BR> Expected time: 6:55 PM<BR> Means of arrival:Ambulance<BR> Comments:<BR> M41. 31 yo f. Abd pain. Vomiting blood witnessed by EMS. 15 min

## 2011-01-24 NOTE — ED Notes (Signed)
FHT noted to be 150's via doppler

## 2011-01-25 ENCOUNTER — Encounter (HOSPITAL_COMMUNITY): Payer: Self-pay | Admitting: Advanced Practice Midwife

## 2011-01-25 ENCOUNTER — Inpatient Hospital Stay (HOSPITAL_COMMUNITY)
Admission: AD | Admit: 2011-01-25 | Discharge: 2011-01-25 | Disposition: A | Discharge status: Home / Self Care | Payer: Medicaid Other | Source: Ambulatory Visit | Attending: Obstetrics & Gynecology | Admitting: Obstetrics & Gynecology

## 2011-01-25 DIAGNOSIS — O210 Mild hyperemesis gravidarum: Secondary | ICD-10-CM | POA: Insufficient documentation

## 2011-01-25 DIAGNOSIS — F329 Major depressive disorder, single episode, unspecified: Secondary | ICD-10-CM | POA: Insufficient documentation

## 2011-01-25 DIAGNOSIS — O99340 Other mental disorders complicating pregnancy, unspecified trimester: Secondary | ICD-10-CM | POA: Insufficient documentation

## 2011-01-25 DIAGNOSIS — E119 Type 2 diabetes mellitus without complications: Secondary | ICD-10-CM

## 2011-01-25 DIAGNOSIS — O219 Vomiting of pregnancy, unspecified: Secondary | ICD-10-CM

## 2011-01-25 DIAGNOSIS — O10919 Unspecified pre-existing hypertension complicating pregnancy, unspecified trimester: Secondary | ICD-10-CM

## 2011-01-25 DIAGNOSIS — F3289 Other specified depressive episodes: Secondary | ICD-10-CM | POA: Insufficient documentation

## 2011-01-25 LAB — CBC
Platelets: 304 10*3/uL (ref 150–400)
RDW: 14 % (ref 11.5–15.5)
WBC: 6.2 10*3/uL (ref 4.0–10.5)

## 2011-01-25 LAB — COMPREHENSIVE METABOLIC PANEL
AST: 10 U/L (ref 0–37)
Albumin: 2.6 g/dL — ABNORMAL LOW (ref 3.5–5.2)
Alkaline Phosphatase: 56 U/L (ref 39–117)
Chloride: 103 mEq/L (ref 96–112)
Potassium: 4.1 mEq/L (ref 3.5–5.1)
Total Bilirubin: 0.2 mg/dL — ABNORMAL LOW (ref 0.3–1.2)

## 2011-01-25 LAB — URINALYSIS, ROUTINE W REFLEX MICROSCOPIC
Glucose, UA: NEGATIVE mg/dL
Leukocytes, UA: NEGATIVE
Protein, ur: NEGATIVE mg/dL
Urobilinogen, UA: 0.2 mg/dL (ref 0.0–1.0)

## 2011-01-25 MED ORDER — GI COCKTAIL ~~LOC~~
30.0000 mL | Freq: Once | ORAL | Status: AC
  Administered 2011-01-25: 30 mL via ORAL
  Filled 2011-01-25: qty 30

## 2011-01-25 MED ORDER — LORAZEPAM 2 MG/ML IJ SOLN
1.0000 mg | Freq: Once | INTRAMUSCULAR | Status: AC
Start: 2011-01-25 — End: 2011-01-25
  Administered 2011-01-25: 1 mg via INTRAMUSCULAR
  Filled 2011-01-25: qty 1

## 2011-01-25 MED ORDER — ESCITALOPRAM OXALATE 20 MG PO TABS: 20.0000 mg | ORAL_TABLET | Freq: Every day | ORAL | Status: DC

## 2011-01-25 MED ORDER — ONDANSETRON HCL 4 MG/2ML IJ SOLN: 4.0000 mg | Freq: Once | INTRAMUSCULAR | Status: DC

## 2011-01-25 MED ORDER — PROMETHAZINE HCL 25 MG/ML IJ SOLN
25.0000 mg | Freq: Four times a day (QID) | INTRAMUSCULAR | Status: DC | PRN
  Administered 2011-01-25: 25 mg via INTRAMUSCULAR

## 2011-01-25 MED ORDER — ONDANSETRON 8 MG PO TBDP: 8.0000 mg | ORAL_TABLET | Freq: Three times a day (TID) | ORAL | Status: AC | PRN

## 2011-01-25 MED ORDER — LACTATED RINGERS IV BOLUS (SEPSIS): 1000.0000 mL | Freq: Once | INTRAVENOUS | Status: DC

## 2011-01-25 MED ORDER — PROMETHAZINE HCL 25 MG/ML IJ SOLN
12.5000 mg | Freq: Once | INTRAMUSCULAR | Status: DC
  Filled 2011-01-25: qty 1

## 2011-01-25 MED ORDER — PANTOPRAZOLE SODIUM 20 MG PO TBEC: 20.0000 mg | DELAYED_RELEASE_TABLET | Freq: Every day | ORAL | Status: DC

## 2011-01-25 MED ORDER — PROMETHAZINE HCL 6.25 MG/5ML PO SYRP: 25.0000 mg | ORAL_SOLUTION | Freq: Four times a day (QID) | ORAL | Status: DC | PRN

## 2011-01-25 NOTE — Progress Notes (Signed)
Pt states was seen at [facility], called EMS to pick her up and take [facility] while still in [place]. States "does not feel right". Denies bleeding or vaginal d/c changes. Denies uti s/s. Pain on lower left abdomen.

## 2011-01-25 NOTE — ED Notes (Signed)
No adverse effect from IM phenergan, nausea relieved.

## 2011-01-25 NOTE — ED Provider Notes (Signed)
History     Chief Complaint  Patient presents with  . Emesis   HPI 31 y.o. [id number] at [redacted]w[redacted]d here c/o nausea/vomiting and anxiety. N/V has been an ongoing issue throughout this pregnancy, multiple admissions, last on 12/25 for same c/o. She states she has not been taking her nausea meds at home - has Zofran tabs, but states she can't keep them down, states phenergan suppositories do not work, phenergan syrup worked, but she ran out. Anxiety x 2 days, had previously been on Lexapro, but stopped recently. Was seen at [facility] last night with the same complaint, received IV fluids and Zofran, was not dehydrated, states she felt like she wasn't "getting what she needed" so she requested transfer here, but because they did not consider there to be a medical indication for transfer, they discharged her from [facility]. She called EMS from there and came to MAU.    Past Medical History  Diagnosis Date  . Asthma   . Diabetes mellitus   . Mental disorder   . Depression   . Anxiety   . Hypertension     Past Surgical History  Procedure Date  . No past surgeries     Family History  Problem Relation Age of Onset  . Heart disease Mother     CHF  . Anesthesia problems Neg Hx     History  Substance Use Topics  . Smoking status: Never Smoker   . Smokeless tobacco: Never Used  . Alcohol Use: No     not with preg    Allergies:  Allergies  Allergen Reactions  . Morphine And Related Nausea And Vomiting    Prescriptions prior to admission  Medication Sig Dispense Refill  . albuterol (PROVENTIL HFA;VENTOLIN HFA) 108 (90 BASE) MCG/ACT inhaler Inhale 2 puffs into the lungs every 6 (six) hours as needed. For shortness of breath      . calcium carbonate (TUMS - DOSED IN MG ELEMENTAL CALCIUM) 500 MG chewable tablet Chew 2 tablets by mouth daily.        . insulin aspart (NOVOLOG) 100 UNIT/ML injection Inject 10 Units into the skin 3 (three) times daily before meals.  10 mL  2  . insulin NPH (HUMULIN  N,NOVOLIN N) 100 UNIT/ML injection Inject 15 Units into the skin 2 (two) times daily.        . multivitamin (BARIATRIC VIT W/EXTRA C) CHEW Chew 1 tablet by mouth daily.        . ondansetron (ZOFRAN) 8 MG tablet Take 8 mg by mouth every 8 (eight) hours as needed. For nausea      . pantoprazole (PROTONIX) 20 MG tablet Take 20 mg by mouth daily at 12 noon.        . Prenatal Vit-Fe Fumarate-FA (PRENATAL MULTIVITAMIN) TABS Take 1 tablet by mouth daily.          Review of Systems  Constitutional: Negative.   Respiratory: Negative.   Cardiovascular: Negative.   Gastrointestinal: Positive for nausea, vomiting and abdominal pain. Negative for diarrhea and constipation.  Genitourinary: Negative for dysuria, urgency, frequency, hematuria and flank pain.       Negative for vaginal bleeding, cramping/contractions  Musculoskeletal: Negative.   Neurological: Negative.   Psychiatric/Behavioral: The patient is nervous/anxious.    Physical Exam   Blood pressure 126/64, pulse 94, temperature 98 F (36.7 C), temperature source Oral, resp. rate 20, height 5\' 6"  (1.676 m), weight 177 lb 3.2 oz (80.377 kg), last menstrual period 09/19/2010, SpO2 98.00%.  Physical  Exam  Nursing note and vitals reviewed. Constitutional: She is oriented to person, place, and time. She appears well-developed and well-nourished. She appears distressed (anxious).  Cardiovascular: Normal rate.   Respiratory: Effort normal. No respiratory distress.  GI: Soft. There is tenderness (generalized).  Musculoskeletal: Normal range of motion.  Neurological: She is alert and oriented to person, place, and time.  Skin: Skin is warm and dry.  Psychiatric: Her mood appears anxious. Her affect is angry. She exhibits a depressed mood.    MAU Course  Procedures Results for orders placed during the hospital encounter of 01/25/11 (from the past 24 hour(s))  CBC     Status: Abnormal   Collection Time   01/25/11  9:05 AM      Component Value  Range   WBC 6.2  4.0 - 10.5 (K/uL)   RBC 3.33 (*) 3.87 - 5.11 (MIL/uL)   Hemoglobin 8.6 (*) 12.0 - 15.0 (g/dL)   HCT 26.1 (*) 36.0 - 46.0 (%)   MCV 78.4  78.0 - 100.0 (fL)   MCH 25.8 (*) 26.0 - 34.0 (pg)   MCHC 33.0  30.0 - 36.0 (g/dL)   RDW 14.0  11.5 - 15.5 (%)   Platelets 304  150 - 400 (K/uL)  COMPREHENSIVE METABOLIC PANEL     Status: Abnormal   Collection Time   01/25/11  9:05 AM      Component Value Range   Sodium 132 (*) 135 - 145 (mEq/L)   Potassium 4.1  3.5 - 5.1 (mEq/L)   Chloride 103  96 - 112 (mEq/L)   CO2 22  19 - 32 (mEq/L)   Glucose, Bld 138 (*) 70 - 99 (mg/dL)   BUN 7  6 - 23 (mg/dL)   Creatinine, Ser 1.03  0.50 - 1.10 (mg/dL)   Calcium 9.3  8.4 - 10.5 (mg/dL)   Total Protein 6.9  6.0 - 8.3 (g/dL)   Albumin 2.6 (*) 3.5 - 5.2 (g/dL)   AST 10  0 - 37 (U/L)   ALT 6  0 - 35 (U/L)   Alkaline Phosphatase 56  39 - 117 (U/L)   Total Bilirubin 0.2 (*) 0.3 - 1.2 (mg/dL)   GFR calc non Af Amer 71 (*) >90 (mL/min)   GFR calc Af Amer 82 (*) >90 (mL/min)  URINALYSIS, ROUTINE W REFLEX MICROSCOPIC     Status: Abnormal   Collection Time   01/25/11  9:36 AM      Component Value Range   Color, Urine YELLOW  YELLOW    APPearance CLEAR  CLEAR    Specific Gravity, Urine 1.020  1.005 - 1.030    pH 7.0  5.0 - 8.0    Glucose, UA NEGATIVE  NEGATIVE (mg/dL)   Hgb urine dipstick NEGATIVE  NEGATIVE    Bilirubin Urine NEGATIVE  NEGATIVE    Ketones, ur 15 (*) NEGATIVE (mg/dL)   Protein, ur NEGATIVE  NEGATIVE (mg/dL)   Urobilinogen, UA 0.2  0.0 - 1.0 (mg/dL)   Nitrite NEGATIVE  NEGATIVE    Leukocytes, UA NEGATIVE  NEGATIVE     GI cocktail for abd pain - some relief  Phenergan 25 mg IM for nausea - no vomiting during MAU visit  Ativan 1 mg IM for anxiety - pt sleeping afterwards   Assessment and Plan  31 y.o. [id number] at [redacted]w[redacted]d Nausea and vomiting of pregnancy - gave new rx for Zofran ODT and Phenergan syrup with refills  Depression/anxiety - restart Lexapro - gave new rx with  refills  Discussed at length that she needs to take her medications regularly or they will not be effective Follow up as scheduled  Densel Kronick 01/25/2011, 9:34 AM

## 2011-01-25 NOTE — ED Notes (Signed)
No adverse affect to ativan.  States feeling better, calm,relaxed.

## 2011-01-25 NOTE — ED Notes (Signed)
GI cocktail effective, no adverse effect

## 2011-01-25 NOTE — Progress Notes (Signed)
Went to [facility] last night at 1800, vomiting blood.  Came by EMS from [facility] now- had been dc'd.  Felt was not getting appropriate care, still throwing up, not feeling any better. Stomach hurts.

## 2011-01-26 NOTE — ED Provider Notes (Signed)
Attestation of Attending Supervision of Advanced Practitioner: Evaluation and management procedures were performed by the PA/NP/CNM/OB Fellow under my supervision/collaboration. Chart reviewed, and agree with management and plan.  Verita Schneiders, M.D. 01/26/2011 8:34 AM

## 2011-01-27 NOTE — ED Provider Notes (Signed)
Medical screening examination/treatment/procedure(s) were conducted as a shared visit with non-physician practitioner(s) and myself.  I personally evaluated the patient during the encounter.  Patient is pregnant. No vaginal bleeding, discharge, lower abdominal pain. Fetal heart rate 150 without ectopy.  No acute abdomen. [facility]  Nat Christen, MD 01/27/11 1421

## 2011-01-28 ENCOUNTER — Inpatient Hospital Stay (HOSPITAL_COMMUNITY)
Admission: AD | Admit: 2011-01-28 | Discharge: 2011-01-31 | DRG: 781 | Disposition: A | Discharge status: Home / Self Care | Payer: Medicaid Other | Source: Ambulatory Visit | Attending: Obstetrics & Gynecology | Admitting: Obstetrics & Gynecology

## 2011-01-28 ENCOUNTER — Encounter (HOSPITAL_COMMUNITY): Payer: Self-pay | Admitting: *Deleted

## 2011-01-28 DIAGNOSIS — K219 Gastro-esophageal reflux disease without esophagitis: Secondary | ICD-10-CM | POA: Diagnosis present

## 2011-01-28 DIAGNOSIS — F329 Major depressive disorder, single episode, unspecified: Secondary | ICD-10-CM | POA: Diagnosis present

## 2011-01-28 DIAGNOSIS — O99340 Other mental disorders complicating pregnancy, unspecified trimester: Secondary | ICD-10-CM | POA: Diagnosis present

## 2011-01-28 DIAGNOSIS — O219 Vomiting of pregnancy, unspecified: Secondary | ICD-10-CM

## 2011-01-28 DIAGNOSIS — F3289 Other specified depressive episodes: Secondary | ICD-10-CM | POA: Diagnosis present

## 2011-01-28 DIAGNOSIS — O210 Mild hyperemesis gravidarum: Secondary | ICD-10-CM

## 2011-01-28 DIAGNOSIS — O99810 Abnormal glucose complicating pregnancy: Secondary | ICD-10-CM | POA: Diagnosis present

## 2011-01-28 LAB — URINALYSIS, ROUTINE W REFLEX MICROSCOPIC
Bilirubin Urine: NEGATIVE
Glucose, UA: NEGATIVE mg/dL
Ketones, ur: NEGATIVE mg/dL
Protein, ur: NEGATIVE mg/dL
pH: 7.5 (ref 5.0–8.0)

## 2011-01-28 LAB — COMPREHENSIVE METABOLIC PANEL
AST: 8 U/L (ref 0–37)
CO2: 22 mEq/L (ref 19–32)
Calcium: 9.4 mg/dL (ref 8.4–10.5)
Chloride: 104 mEq/L (ref 96–112)
Creatinine, Ser: 1.06 mg/dL (ref 0.50–1.10)
GFR calc Af Amer: 79 mL/min — ABNORMAL LOW (ref >90)
GFR calc non Af Amer: 68 mL/min — ABNORMAL LOW (ref >90)
Glucose, Bld: 164 mg/dL — ABNORMAL HIGH (ref 70–99)
Total Bilirubin: 0.2 mg/dL — ABNORMAL LOW (ref 0.3–1.2)

## 2011-01-28 LAB — CBC
Hemoglobin: 8.7 g/dL — ABNORMAL LOW (ref 12.0–15.0)
MCH: 25.6 pg — ABNORMAL LOW (ref 26.0–34.0)
MCHC: 32.8 g/dL (ref 30.0–36.0)
Platelets: 306 10*3/uL (ref 150–400)
RBC: 3.4 MIL/uL — ABNORMAL LOW (ref 3.87–5.11)

## 2011-01-28 LAB — GLUCOSE, CAPILLARY
Glucose-Capillary: 138 mg/dL — ABNORMAL HIGH (ref 70–99)
Glucose-Capillary: 122 mg/dL — ABNORMAL HIGH (ref 70–99)
Glucose-Capillary: 131 mg/dL — ABNORMAL HIGH (ref 70–99)

## 2011-01-28 MED ORDER — DEXTROSE IN LACTATED RINGERS 5 % IV SOLN
INTRAVENOUS | Status: DC
  Administered 2011-01-28: 02:00:00 via INTRAVENOUS

## 2011-01-28 MED ORDER — PROMETHAZINE HCL 25 MG PO TABS: 25.0000 mg | ORAL_TABLET | Freq: Four times a day (QID) | ORAL | Status: DC | PRN

## 2011-01-28 MED ORDER — ALBUTEROL SULFATE HFA 108 (90 BASE) MCG/ACT IN AERS
2.0000 | INHALATION_SPRAY | Freq: Four times a day (QID) | RESPIRATORY_TRACT | Status: DC | PRN
  Filled 2011-01-28: qty 6.7

## 2011-01-28 MED ORDER — PROMETHAZINE HCL 25 MG/ML IJ SOLN
12.5000 mg | INTRAMUSCULAR | Status: DC | PRN
  Administered 2011-01-28 – 2011-01-29 (×5): 12.5 mg via INTRAVENOUS
  Filled 2011-01-28 (×5): qty 1

## 2011-01-28 MED ORDER — ACETAMINOPHEN 325 MG PO TABS
650.0000 mg | ORAL_TABLET | Freq: Four times a day (QID) | ORAL | Status: DC | PRN
  Filled 2011-01-28: qty 2

## 2011-01-28 MED ORDER — INSULIN NPH (HUMAN) (ISOPHANE) 100 UNIT/ML ~~LOC~~ SUSP
15.0000 [IU] | Freq: Two times a day (BID) | SUBCUTANEOUS | Status: DC
  Filled 2011-01-28: qty 10

## 2011-01-28 MED ORDER — PANTOPRAZOLE SODIUM 40 MG IV SOLR
40.0000 mg | INTRAVENOUS | Status: DC
  Administered 2011-01-28 – 2011-01-29 (×2): 40 mg via INTRAVENOUS
  Filled 2011-01-28 (×3): qty 40

## 2011-01-28 MED ORDER — GI COCKTAIL ~~LOC~~
30.0000 mL | Freq: Once | ORAL | Status: AC
  Administered 2011-01-28: 30 mL via ORAL
  Filled 2011-01-28: qty 30

## 2011-01-28 MED ORDER — ONDANSETRON HCL 4 MG/2ML IJ SOLN
4.0000 mg | Freq: Once | INTRAMUSCULAR | Status: DC
  Filled 2011-01-28: qty 2

## 2011-01-28 MED ORDER — METOCLOPRAMIDE HCL 5 MG/ML IJ SOLN
10.0000 mg | Freq: Four times a day (QID) | INTRAMUSCULAR | Status: DC
  Administered 2011-01-28 – 2011-01-29 (×4): 10 mg via INTRAVENOUS
  Filled 2011-01-28 (×4): qty 2

## 2011-01-28 MED ORDER — INSULIN ASPART 100 UNIT/ML ~~LOC~~ SOLN
10.0000 [IU] | Freq: Three times a day (TID) | SUBCUTANEOUS | Status: DC
  Filled 2011-01-28: qty 3

## 2011-01-28 MED ORDER — DIPHENHYDRAMINE HCL 50 MG/ML IJ SOLN: 50.0000 mg | Freq: Four times a day (QID) | INTRAMUSCULAR | Status: DC | PRN

## 2011-01-28 MED ORDER — HYDROXYZINE HCL 50 MG/ML IM SOLN
25.0000 mg | Freq: Four times a day (QID) | INTRAMUSCULAR | Status: DC | PRN
  Administered 2011-01-28 – 2011-01-31 (×9): 25 mg via INTRAMUSCULAR
  Filled 2011-01-28 (×3): qty 0.5

## 2011-01-28 MED ORDER — ESCITALOPRAM OXALATE 20 MG PO TABS
20.0000 mg | ORAL_TABLET | Freq: Every day | ORAL | Status: DC
  Filled 2011-01-28: qty 1

## 2011-01-28 MED ORDER — METOCLOPRAMIDE HCL 5 MG/ML IJ SOLN: 10.0000 mg | Freq: Once | INTRAMUSCULAR | Status: DC

## 2011-01-28 MED ORDER — INSULIN NPH (HUMAN) (ISOPHANE) 100 UNIT/ML ~~LOC~~ SUSP: 15.0000 [IU] | Freq: Two times a day (BID) | SUBCUTANEOUS | Status: DC

## 2011-01-28 MED ORDER — LACTATED RINGERS IV SOLN
INTRAVENOUS | Status: DC
  Administered 2011-01-28 – 2011-01-29 (×3): via INTRAVENOUS

## 2011-01-28 MED ORDER — COMPLETENATE 29-1 MG PO CHEW
1.0000 | CHEWABLE_TABLET | Freq: Every day | ORAL | Status: DC
  Administered 2011-01-30: 1 via ORAL
  Filled 2011-01-28 (×5): qty 1

## 2011-01-28 MED ORDER — ONDANSETRON 4 MG PO TBDP
4.0000 mg | ORAL_TABLET | Freq: Four times a day (QID) | ORAL | Status: DC | PRN
  Filled 2011-01-28: qty 1

## 2011-01-28 MED ORDER — IBUPROFEN 600 MG PO TABS: 600.0000 mg | ORAL_TABLET | Freq: Four times a day (QID) | ORAL | Status: DC | PRN

## 2011-01-28 MED ORDER — PROMETHAZINE HCL 25 MG/ML IJ SOLN
12.5000 mg | Freq: Once | INTRAMUSCULAR | Status: AC
  Administered 2011-01-28: 12.5 mg via INTRAVENOUS
  Filled 2011-01-28: qty 1

## 2011-01-28 MED ORDER — SODIUM CHLORIDE 0.9 % IJ SOLN
INTRAMUSCULAR | Status: AC
  Filled 2011-01-28: qty 3

## 2011-01-28 MED ORDER — ONDANSETRON HCL 4 MG/2ML IJ SOLN
4.0000 mg | Freq: Four times a day (QID) | INTRAMUSCULAR | Status: DC | PRN
  Administered 2011-01-28: 4 mg via INTRAVENOUS
  Filled 2011-01-28: qty 2

## 2011-01-28 NOTE — Plan of Care (Signed)
Problem: Consults Goal: Birthing Suites Patient Information Press F2 to bring up selections list  Outcome: Not Applicable Date Met:  [id number]  Pt < [redacted] weeks EGA

## 2011-01-28 NOTE — H&P (Signed)
History     Chief Complaint  Patient presents with  . Nausea  . Emesis   HPI  Pt reports increased nausea and vomiting that increased last night.  Reports unable to hold anything down and states she is extremely anxious and needs meds for her nerves.   Denies fever, body aches, and chills.  Also reports upper abdominal pain "heartburn".  Denies contractions, vaginal bleeding, or leaking of fluid.    Past Medical History  Diagnosis Date  . Asthma   . Diabetes mellitus   . Mental disorder   . Depression   . Anxiety   . Hypertension     Past Surgical History  Procedure Date  . No past surgeries     Family History  Problem Relation Age of Onset  . Heart disease Mother     CHF  . Anesthesia problems Neg Hx     History  Substance Use Topics  . Smoking status: Never Smoker   . Smokeless tobacco: Never Used  . Alcohol Use: No     not with preg    Allergies:  Allergies  Allergen Reactions  . Morphine And Related Nausea And Vomiting    Prescriptions prior to admission  Medication Sig Dispense Refill  . albuterol (PROVENTIL HFA;VENTOLIN HFA) 108 (90 BASE) MCG/ACT inhaler Inhale 2 puffs into the lungs every 6 (six) hours as needed. For shortness of breath      . calcium carbonate (TUMS - DOSED IN MG ELEMENTAL CALCIUM) 500 MG chewable tablet Chew 2 tablets by mouth daily.        Marland Kitchen escitalopram (LEXAPRO) 20 MG tablet Take 1 tablet (20 mg total) by mouth daily.  30 tablet  3  . insulin aspart (NOVOLOG) 100 UNIT/ML injection Inject 10 Units into the skin 3 (three) times daily before meals.  10 mL  2  . insulin NPH (HUMULIN N,NOVOLIN N) 100 UNIT/ML injection Inject 15 Units into the skin 2 (two) times daily.        . multivitamin (BARIATRIC VIT W/EXTRA C) CHEW Chew 1 tablet by mouth daily.        . ondansetron (ZOFRAN ODT) 8 MG disintegrating tablet Take 1 tablet (8 mg total) by mouth every 8 (eight) hours as needed for nausea.  30 tablet  3  . pantoprazole (PROTONIX) 20 MG  tablet Take 1 tablet (20 mg total) by mouth daily at 12 noon.  30 tablet  6  . Prenatal Vit-Fe Fumarate-FA (PRENATAL MULTIVITAMIN) TABS Take 1 tablet by mouth daily.        . promethazine (PHENERGAN) 6.25 MG/5ML syrup Take 20 mLs (25 mg total) by mouth 4 (four) times daily as needed for nausea.  473 mL  3    Review of Systems  Constitutional: Negative for fever and chills.  General:  Extremely agitated, frequent movement Gastrointestinal: Positive for heartburn, nausea and vomiting. Negative for diarrhea.  All other systems reviewed and are negative.   Physical Exam   Blood pressure 134/91, pulse 116, temperature 98.2 F (36.8 C), temperature source Oral, resp. rate 22, last menstrual period 09/19/2010, SpO2 99.00%.  Physical Exam  Constitutional: She is oriented to person, place, and time. She appears well-developed and well-nourished. She appears distressed (vomiting ).  HENT:  Head: Normocephalic.  Mouth/Throat: Mucous membranes are normal. Mucous membranes are not dry.  Neck: Normal range of motion. Neck supple.  Cardiovascular: Normal rate, regular rhythm and normal heart sounds.   Respiratory: Effort normal and breath sounds normal.  GI: Soft. There is no tenderness.       Hyperactive bowel sounds  Genitourinary: No bleeding around the vagina.  Neurological: She is alert and oriented to person, place, and time.  Skin: Skin is warm and dry.   Results for orders placed during the hospital encounter of 01/28/11 (from the past 24 hour(s))  COMPREHENSIVE METABOLIC PANEL     Status: Abnormal   Collection Time   01/28/11  2:05 AM      Component Value Range   Sodium 137  135 - 145 (mEq/L)   Potassium 4.1  3.5 - 5.1 (mEq/L)   Chloride 104  96 - 112 (mEq/L)   CO2 22  19 - 32 (mEq/L)   Glucose, Bld 164 (*) 70 - 99 (mg/dL)   BUN 11  6 - 23 (mg/dL)   Creatinine, Ser 1.06  0.50 - 1.10 (mg/dL)   Calcium 9.4  8.4 - 10.5 (mg/dL)   Total Protein 6.8  6.0 - 8.3 (g/dL)   Albumin 2.8 (*)  3.5 - 5.2 (g/dL)   AST 8  0 - 37 (U/L)   ALT 5  0 - 35 (U/L)   Alkaline Phosphatase 56  39 - 117 (U/L)   Total Bilirubin 0.2 (*) 0.3 - 1.2 (mg/dL)   GFR calc non Af Amer 68 (*) >90 (mL/min)   GFR calc Af Amer 79 (*) >90 (mL/min)  CBC     Status: Abnormal   Collection Time   01/28/11  2:05 AM      Component Value Range   WBC 10.0  4.0 - 10.5 (K/uL)   RBC 3.40 (*) 3.87 - 5.11 (MIL/uL)   Hemoglobin 8.7 (*) 12.0 - 15.0 (g/dL)   HCT 26.5 (*) 36.0 - 46.0 (%)   MCV 77.9 (*) 78.0 - 100.0 (fL)   MCH 25.6 (*) 26.0 - 34.0 (pg)   MCHC 32.8  30.0 - 36.0 (g/dL)   RDW 13.9  11.5 - 15.5 (%)   Platelets 306  150 - 400 (K/uL)     MAU Course  Procedures  Vomiting in Pregnancy GERD Gest Diabetes  Assessment and Plan  Plan: Admit to [facility] for 24 hr obs Continue medication for diabetes Meds for hyperemesis   [facility]

## 2011-01-28 NOTE — ED Provider Notes (Signed)
History     Chief Complaint  Patient presents with  . Nausea  . Emesis   HPI  Pt reports increased nausea and vomiting that increased last night.  Denies fever, body aches, and chills.  Also reports upper abdominal pain "heartburn".  Denies contractions, vaginal bleeding, or leaking of fluid.    Past Medical History  Diagnosis Date  . Asthma   . Diabetes mellitus   . Mental disorder   . Depression   . Anxiety   . Hypertension     Past Surgical History  Procedure Date  . No past surgeries     Family History  Problem Relation Age of Onset  . Heart disease Mother     CHF  . Anesthesia problems Neg Hx     History  Substance Use Topics  . Smoking status: Never Smoker   . Smokeless tobacco: Never Used  . Alcohol Use: No     not with preg    Allergies:  Allergies  Allergen Reactions  . Morphine And Related Nausea And Vomiting    Prescriptions prior to admission  Medication Sig Dispense Refill  . albuterol (PROVENTIL HFA;VENTOLIN HFA) 108 (90 BASE) MCG/ACT inhaler Inhale 2 puffs into the lungs every 6 (six) hours as needed. For shortness of breath      . calcium carbonate (TUMS - DOSED IN MG ELEMENTAL CALCIUM) 500 MG chewable tablet Chew 2 tablets by mouth daily.        Marland Kitchen escitalopram (LEXAPRO) 20 MG tablet Take 1 tablet (20 mg total) by mouth daily.  30 tablet  3  . insulin aspart (NOVOLOG) 100 UNIT/ML injection Inject 10 Units into the skin 3 (three) times daily before meals.  10 mL  2  . insulin NPH (HUMULIN N,NOVOLIN N) 100 UNIT/ML injection Inject 15 Units into the skin 2 (two) times daily.        . multivitamin (BARIATRIC VIT W/EXTRA C) CHEW Chew 1 tablet by mouth daily.        . ondansetron (ZOFRAN ODT) 8 MG disintegrating tablet Take 1 tablet (8 mg total) by mouth every 8 (eight) hours as needed for nausea.  30 tablet  3  . pantoprazole (PROTONIX) 20 MG tablet Take 1 tablet (20 mg total) by mouth daily at 12 noon.  30 tablet  6  . Prenatal Vit-Fe Fumarate-FA  (PRENATAL MULTIVITAMIN) TABS Take 1 tablet by mouth daily.        . promethazine (PHENERGAN) 6.25 MG/5ML syrup Take 20 mLs (25 mg total) by mouth 4 (four) times daily as needed for nausea.  473 mL  3    Review of Systems  Constitutional: Negative for fever and chills.  Gastrointestinal: Positive for heartburn, nausea and vomiting. Negative for diarrhea.  All other systems reviewed and are negative.   Physical Exam   Blood pressure 134/91, pulse 116, temperature 98.2 F (36.8 C), temperature source Oral, resp. rate 22, last menstrual period 09/19/2010, SpO2 99.00%.  Physical Exam  Constitutional: She is oriented to person, place, and time. She appears well-developed and well-nourished. She appears distressed (vomiting ).  HENT:  Head: Normocephalic.  Mouth/Throat: Mucous membranes are normal. Mucous membranes are not dry.  Neck: Normal range of motion. Neck supple.  Cardiovascular: Normal rate, regular rhythm and normal heart sounds.   Respiratory: Effort normal and breath sounds normal.  GI: Soft. There is no tenderness.       Hyperactive bowel sounds  Genitourinary: No bleeding around the vagina.  Neurological: She is  alert and oriented to person, place, and time.  Skin: Skin is warm and dry.   Results for orders placed during the hospital encounter of 01/28/11 (from the past 24 hour(s))  COMPREHENSIVE METABOLIC PANEL     Status: Abnormal   Collection Time   01/28/11  2:05 AM      Component Value Range   Sodium 137  135 - 145 (mEq/L)   Potassium 4.1  3.5 - 5.1 (mEq/L)   Chloride 104  96 - 112 (mEq/L)   CO2 22  19 - 32 (mEq/L)   Glucose, Bld 164 (*) 70 - 99 (mg/dL)   BUN 11  6 - 23 (mg/dL)   Creatinine, Ser 1.06  0.50 - 1.10 (mg/dL)   Calcium 9.4  8.4 - 10.5 (mg/dL)   Total Protein 6.8  6.0 - 8.3 (g/dL)   Albumin 2.8 (*) 3.5 - 5.2 (g/dL)   AST 8  0 - 37 (U/L)   ALT 5  0 - 35 (U/L)   Alkaline Phosphatase 56  39 - 117 (U/L)   Total Bilirubin 0.2 (*) 0.3 - 1.2 (mg/dL)    GFR calc non Af Amer 68 (*) >90 (mL/min)   GFR calc Af Amer 79 (*) >90 (mL/min)  CBC     Status: Abnormal   Collection Time   01/28/11  2:05 AM      Component Value Range   WBC 10.0  4.0 - 10.5 (K/uL)   RBC 3.40 (*) 3.87 - 5.11 (MIL/uL)   Hemoglobin 8.7 (*) 12.0 - 15.0 (g/dL)   HCT 26.5 (*) 36.0 - 46.0 (%)   MCV 77.9 (*) 78.0 - 100.0 (fL)   MCH 25.6 (*) 26.0 - 34.0 (pg)   MCHC 32.8  30.0 - 36.0 (g/dL)   RDW 13.9  11.5 - 15.5 (%)   Platelets 306  150 - 400 (K/uL)     MAU Course  Procedures  Vomiting in Pregnancy GERD  Assessment and Plan  Plan: DC to home Continue meds Keep scheduled appointment.  [facility] 01/28/2011, 5:33 AM   Upon discharge home pt reports that she cannot go home due to increased vomiting and unable to hold anything down.  Pt will be admitted for 24 hour observation.  Pt extremely anxious and requesting meds for nerves.

## 2011-01-28 NOTE — Progress Notes (Signed)
Nausea and vomiting for a couple of days. Sharp lower abdominal pain. no vaginal bleeding or discharge

## 2011-01-29 ENCOUNTER — Inpatient Hospital Stay (HOSPITAL_COMMUNITY)
Admission: RE | Admit: 2011-01-29 | Discharge: 2011-01-29 | Disposition: A | Discharge status: Home / Self Care | Payer: Medicaid Other | Source: Ambulatory Visit | Attending: Family Medicine | Admitting: Family Medicine

## 2011-01-29 DIAGNOSIS — E119 Type 2 diabetes mellitus without complications: Secondary | ICD-10-CM

## 2011-01-29 DIAGNOSIS — O10919 Unspecified pre-existing hypertension complicating pregnancy, unspecified trimester: Secondary | ICD-10-CM

## 2011-01-29 DIAGNOSIS — Z0489 Encounter for examination and observation for other specified reasons: Secondary | ICD-10-CM

## 2011-01-29 LAB — GLUCOSE, CAPILLARY
Glucose-Capillary: 132 mg/dL — ABNORMAL HIGH (ref 70–99)
Glucose-Capillary: 100 mg/dL — ABNORMAL HIGH (ref 70–99)

## 2011-01-29 MED ORDER — POLYETHYLENE GLYCOL 3350 17 G PO PACK
17.0000 g | PACK | Freq: Every day | ORAL | Status: DC
  Administered 2011-01-30: 17 g via ORAL
  Filled 2011-01-29 (×4): qty 1

## 2011-01-29 MED ORDER — GI COCKTAIL ~~LOC~~
30.0000 mL | Freq: Once | ORAL | Status: AC
  Administered 2011-01-29: 30 mL via ORAL
  Filled 2011-01-29: qty 30

## 2011-01-29 MED ORDER — STERILE DILUENT FOR HUMULIN INSULINS: 5.0000 [IU] | Freq: Two times a day (BID) | SUBCUTANEOUS | Status: DC

## 2011-01-29 MED ORDER — METOCLOPRAMIDE HCL 10 MG PO TABS
10.0000 mg | ORAL_TABLET | Freq: Three times a day (TID) | ORAL | Status: DC
  Administered 2011-01-29 – 2011-01-30 (×4): 10 mg via ORAL
  Filled 2011-01-29 (×5): qty 1

## 2011-01-29 MED ORDER — ZOLPIDEM TARTRATE 5 MG PO TABS
5.0000 mg | ORAL_TABLET | Freq: Every evening | ORAL | Status: DC | PRN
  Administered 2011-01-29: 5 mg via ORAL
  Filled 2011-01-29 (×2): qty 1

## 2011-01-29 MED ORDER — PROMETHAZINE HCL 25 MG RE SUPP
25.0000 mg | Freq: Four times a day (QID) | RECTAL | Status: DC | PRN
  Administered 2011-01-29 – 2011-01-30 (×4): 25 mg via RECTAL
  Filled 2011-01-29 (×3): qty 1

## 2011-01-29 MED ORDER — PROMETHAZINE HCL 25 MG PO TABS: 25.0000 mg | ORAL_TABLET | Freq: Four times a day (QID) | ORAL | Status: DC | PRN

## 2011-01-29 MED ORDER — INSULIN NPH (HUMAN) (ISOPHANE) 100 UNIT/ML ~~LOC~~ SUSP
5.0000 [IU] | Freq: Two times a day (BID) | SUBCUTANEOUS | Status: DC
  Administered 2011-01-29 – 2011-01-31 (×5): 5 [IU] via SUBCUTANEOUS
  Filled 2011-01-29: qty 10

## 2011-01-29 NOTE — Progress Notes (Signed)
[name] was seen for ultrasound appointment today.  Please see [facility] report for details.

## 2011-01-29 NOTE — Progress Notes (Signed)
Pt's boyfriend, Lorane Gell came to this Sw to complain about the way he and the pt were treated by a staff member this morning.  He asked this Sw for a meal ticket as compensation for the way he was talked to.  Sw provided the FOB with 2 meal tickets and explained that the department would not be able to assist with meals on a continuous bases.  He verbalized understanding.   This Sw is familiar with this social situation, as FOB request bus passes frequently when pt is hospitalized.  Sw informed bedside RN of his complaint and services provided.

## 2011-01-29 NOTE — Progress Notes (Signed)
UR Chart review completed.  

## 2011-01-29 NOTE — Plan of Care (Signed)
Problem: Consults Goal: Hyperemesis Patient Education (See Patient Education module for education specifics.) Outcome: Completed/Met Date Met:  01/29/11 Previous admissions, teaching reinforced again.

## 2011-01-29 NOTE — Progress Notes (Signed)
Subjective: Patient reports nausea and vomiting and constipation States no improvement from admission Also complains of anxiety, and states that she needs ativan  Objective: I have reviewed patient's vital signs, intake and output, medications and labs.  General: alert, cooperative, appears stated age and no distress Resp: clear to auscultation bilaterally and no wheeze, rhonchi, or rales. Normal Resp effort Cardio: regular rate and rhythm, S1, S2 normal, no murmur, click, rub or gallop, regular rate and rhythm and 2+ radial pulses bilaterally GI: soft, non-tender; bowel sounds normal; no masses,  no organomegaly and normal findings: bowel sounds normal, no masses palpable, no organomegaly, soft, non-tender and no rebound, guarding, or mass Extremities: extremities normal, atraumatic, no cyanosis or edema, edema none, Homans sign is negative, no sign of DVT, no edema, redness or tenderness in the calves or thighs, no ulcers, gangrene or trophic changes and acantotic changes along tibia   Assessment/Plan: -Nausea/Vomiting  - Hyperemesis Gravidarum vs psychogenic  - normal exam and normal labs help rule out organic causes, electrolyte abnormality, DKA  - continue antiemetics (Zofran / phenergan / reglan_  - Will DC IV phenergan due to potential dystonic reactions  - cont IVF  - encourage po intake  - anticipate DC once patient tolerating PO, expect 1-2 days  - Pregnancy  - [id number] today  - complicated by diabetes II  - patient to have anatomy US today ( scheduled as for outpatient)  - no concerns at this time  - Diabetes  - sugars slightly elevated  - not eating , have been holding insulin  - will give basal insulin doses and cont to monitor, hold mealtime insulin as long as Pt not eating  Dispo: cont care, anticipate DC 1-2 days if continued improvement  LOS: 1 day    [name] 01/29/2011, 9:32 AM

## 2011-01-30 ENCOUNTER — Inpatient Hospital Stay (HOSPITAL_COMMUNITY): Payer: Medicaid Other

## 2011-01-30 LAB — GLUCOSE, CAPILLARY
Glucose-Capillary: 103 mg/dL — ABNORMAL HIGH (ref 70–99)
Glucose-Capillary: 141 mg/dL — ABNORMAL HIGH (ref 70–99)

## 2011-01-30 MED ORDER — PROMETHAZINE HCL 6.25 MG/5ML PO SYRP
25.0000 mg | ORAL_SOLUTION | Freq: Four times a day (QID) | ORAL | Status: DC | PRN
  Administered 2011-01-30 – 2011-01-31 (×3): 25 mg
  Filled 2011-01-30 (×2): qty 20

## 2011-01-30 MED ORDER — JEVITY 1.2 CAL PO LIQD
1000.0000 mL | ORAL | Status: DC
  Administered 2011-01-30: 1000 mL
  Filled 2011-01-30 (×4): qty 1000

## 2011-01-30 MED ORDER — JEVITY 1.2 CAL PO LIQD
1000.0000 mL | ORAL | Status: DC
  Administered 2011-01-30: 20 mL
  Filled 2011-01-30: qty 1000

## 2011-01-30 MED ORDER — ESCITALOPRAM OXALATE 10 MG PO TABS
10.0000 mg | ORAL_TABLET | Freq: Every day | ORAL | Status: DC
  Administered 2011-01-30: 10 mg via ORAL
  Filled 2011-01-30 (×3): qty 1

## 2011-01-30 MED ORDER — PANTOPRAZOLE SODIUM 40 MG PO TBEC
40.0000 mg | DELAYED_RELEASE_TABLET | Freq: Every day | ORAL | Status: DC
  Administered 2011-01-30: 40 mg via ORAL
  Filled 2011-01-30 (×3): qty 1

## 2011-01-30 NOTE — Plan of Care (Signed)
Problem: Phase I Progression Outcomes Goal: Initial discharge plan identified Outcome: Progressing Patient tolerating a regular diet  Nausea and vomiting under control

## 2011-01-30 NOTE — Progress Notes (Signed)
Dr. Hulan Fray notified of feeding tube placement.  May start tube feeding.

## 2011-01-30 NOTE — Progress Notes (Signed)
INITIAL ADULT NUTRITION ASSESSMENT Date: 01/30/2011   Time: 12:49 PM Reason for Assessment: Tube Feeding Consult  ASSESSMENT: Female 31 y.o.  Dx:  Patient Active Problem List  Diagnoses  . Type 2 diabetes mellitus  . Hyperemesis gravidarum before end of [redacted] week gestation, dehydration  . Chronic hypertension in pregnancy  . Depression  . Asthma  . H. pylori infection  . Supervision of high-risk pregnancy    Hx:  Past Medical History  Diagnosis Date  . Asthma   . Diabetes mellitus   . Mental disorder   . Depression   . Anxiety   . Hypertension    Related Meds:     . escitalopram  10 mg Oral Daily  . insulin aspart  10 Units Subcutaneous TID AC  . insulin NPH  5 Units Subcutaneous BID  . metoCLOPramide  10 mg Oral TID AC  . pantoprazole  40 mg Oral Q1200  . polyethylene glycol  17 g Oral Daily  . prenatal vitamin w/FE, FA  1 tablet Oral Daily  . DISCONTD: pantoprazole (PROTONIX) IV  40 mg Intravenous Q24H    Ht: 5\' 6"  (167.6 cm)  Wt: 171 lb (77.565 kg)  Ideal Wt: 59.3 kg  % Ideal Wt: 131%  Usual Wt: 181 Lbs (82.3 kg) % Usual Wt: 94.4%  Body mass index is 27.60 kg/(m^2).  Food/Nutrition Related Hx: Multiple admissions for Hyperemesis. Pervious need for enteral support. Pt with a > 5% loss of usual weight, no net weight gain during pregnancy. Likely with mild to moderate degree of malnutrition given weight loss and length of time in weeks hyperemesis hs persisted  Labs:  CMP     Component Value Date/Time   NA 137 01/28/2011 0205   K 4.1 01/28/2011 0205   CL 104 01/28/2011 0205   CO2 22 01/28/2011 0205   GLUCOSE 164* 01/28/2011 0205   BUN 11 01/28/2011 0205   CREATININE 1.06 01/28/2011 0205   CALCIUM 9.4 01/28/2011 0205   PROT 6.8 01/28/2011 0205   ALBUMIN 2.8* 01/28/2011 0205   AST 8 01/28/2011 0205   ALT 5 01/28/2011 0205   ALKPHOS 56 01/28/2011 0205   BILITOT 0.2* 01/28/2011 0205   GFRNONAA 68* 01/28/2011 0205   GFRAA 79* 01/28/2011 0205   CBG (last 3)    Basename 01/30/11 1206 01/30/11 0753 01/30/11 0605  GLUCAP 94 107* 103*     Intake:   Intake/Output Summary (Last 24 hours) at 01/30/11 1254 Last data filed at 01/30/11 1200  Gross per 24 hour  Intake     90 ml  Output    675 ml  Net   -585 ml    Diet Order: Criss Rosales  Supplements/Tube Feeding:Jevity 1.2 at 20 ml/hr cng  IVF:    feeding supplement (JEVITY 1.2) Last Rate: 20 mL (01/30/11 1239)  DISCONTD: lactated ringers Last Rate: 125 mL/hr at 01/29/11 0407    Estimated Nutritional Needs:   Kcal: 22-2400 Protein: 90-100 g Fluid: 2.2 liters  NUTRITION DIAGNOSIS: and p -Inadequate oral intake (NI-2.1).  Status: Ongoing RELATED TO: Hyperemesis AS EVIDENCE BY: weight loss, nausea and vomiting  MONITORING/EVALUATION(Goals): Meet above estimated needs for second trimester pregnancy Promote weight gain Minimize nausea symptoms associated with Hyperemesis  EDUCATION NEEDS: -No education needs identified at this time  INTERVENTION: Jevity 1.2 at 20 ml/hr CNG, to advance by 10 ml q 6 hours to a goal of 80 ml/hr Provides 2304 Kcal, 106 g protein, 1555 ml free water Check CMP and phos level in  AM  Dietitian [id number]  [place] Per approved criteria  -Non-severe (moderate) malnutrition in the context of acute illness or injury    Rodgerick Gilliand,KATHY 01/30/2011, 12:49 PM

## 2011-01-30 NOTE — Progress Notes (Signed)
Subjective: Patient reports nausea and vomiting.  She says seh wants ativan. She quit taking her Lexapro just before she was admitted.  Objective: I have reviewed patient's vital signs, intake and output, medications and labs.  General- complaining that she wants something for her nerves Abd- benign   Assessment/Plan: Hyperemesis- We discussed her care. She remembers that she felt better with a feeding tube. She would like to try this again. I have told her twice that I am not going to give her ativan.  LOS: 2 days    Reia Viernes C. 01/30/2011, 9:28 AM

## 2011-01-30 NOTE — Progress Notes (Signed)
Brief Nutrition Note  RD contacted by RN regarding initiation of enteral nutrition. This RD spoke with [facility] RD regarding patient. Per Estevan Ryder RD, recommend initiation of Jevity 1.2 at 20 ml/hr via NGT.  Plan/Recommendations: 1. Place RD consult for TF Initiation/Management 2. Initiate Jevity 1.2 at 20 ml/hr via NGT 3. RD to follow-up and provide advancement recommendations and nutrition assessment.  Asencion Partridge RD, LDN

## 2011-01-30 NOTE — Progress Notes (Signed)
The patient is receiving Protonix by the intravenous route.  Based on criteria approved by the Pharmacy and [place], the medication is being converted to the equivalent oral dose form.  These criteria include: -No Active GI bleeding -Able to tolerate diet of full liquids (or better) or tube feeding -Able to tolerate other medications by the oral or enteral route  If you have any questions about this conversion, please contact the Pharmacy Department (ext [phone]).  Thank you.  01/30/2011. Karma Ganja [place]

## 2011-01-31 ENCOUNTER — Inpatient Hospital Stay (HOSPITAL_COMMUNITY): Payer: Medicaid Other

## 2011-01-31 LAB — GLUCOSE, CAPILLARY: Glucose-Capillary: 141 mg/dL — ABNORMAL HIGH (ref 70–99)

## 2011-01-31 MED ORDER — HYDROXYZINE PAMOATE 25 MG PO CAPS: 25.0000 mg | ORAL_CAPSULE | Freq: Three times a day (TID) | ORAL | Status: DC | PRN

## 2011-01-31 MED ORDER — INSULIN ASPART 100 UNIT/ML ~~LOC~~ SOLN
3.0000 [IU] | Freq: Once | SUBCUTANEOUS | Status: AC
  Administered 2011-01-31: 3 [IU] via SUBCUTANEOUS

## 2011-01-31 MED ORDER — PROMETHAZINE HCL 25 MG RE SUPP: 25.0000 mg | Freq: Four times a day (QID) | RECTAL | Status: DC | PRN

## 2011-01-31 MED ORDER — ESCITALOPRAM OXALATE 10 MG PO TABS: 10.0000 mg | ORAL_TABLET | Freq: Every day | ORAL | Status: DC

## 2011-01-31 MED ORDER — INSULIN NPH (HUMAN) (ISOPHANE) 100 UNIT/ML ~~LOC~~ SUSP
SUBCUTANEOUS | Status: DC
Start: 2011-01-31 — End: 2011-02-08

## 2011-01-31 MED ORDER — JEVITY 1.2 CAL PO LIQD: 1000.0000 mL | ORAL | Status: DC

## 2011-01-31 NOTE — Discharge Summary (Signed)
Physician Discharge Summary  Patient ID: [name] MRN: [id number] DOB/AGE: 1980-02-01 31 y.o.  Admit date: 01/28/2011 Discharge date: 01/31/2011  Admission Diagnoses: hyperemesis gravidarium, [redacted] weeks EGA, depression  Discharge Diagnoses: same Active Problems:  * No active hospital problems. *    Discharged Condition: fair  Hospital Course: She was admitted for her long-standing h.g. She continued to vomit occasionally and she agreed to the replacement of the [place]. Tube feeds were restarted and she felt better by the day of discharge. Of note, she had quit taking her lexapro, and it was restarted during her hospital stay. She received her BID NPH insulin but her novalog doses were held because her sugars were in the 140 range.  Consults: none  Significant Diagnostic Studies: none  Treatments: IV hydration  Discharge Exam: Blood pressure 111/76, pulse 97, temperature 98.4 F (36.9 C), temperature source Oral, resp. rate 16, height 5\' 6"  (1.676 m), weight 74.889 kg (165 lb 1.6 oz), last menstrual period 09/19/2010, SpO2 100.00%. General appearance: alert Cardio: regular rate and rhythm, S1, S2 normal, no murmur, click, rub or gallop GI: soft, non-tender; bowel sounds normal; no masses,  no organomegaly  Disposition: Home or Self Care  Discharge Orders    Future Appointments: Provider: Department: Dept Phone: Center:   02/04/2011 7:45 AM [facility] [phone] [place]   02/26/2011 2:15 PM [facility] [place] [facility] [phone] [facility]     Medication List  As of 01/31/2011  7:46 AM   TAKE these medications         albuterol 108 (90 BASE) MCG/ACT inhaler   Commonly known as: PROVENTIL HFA;VENTOLIN HFA   Inhale 2 puffs into the lungs every 6 (six) hours as needed. For shortness of breath      calcium carbonate 500 MG chewable tablet   Commonly known as: TUMS - dosed in mg elemental calcium   Chew 2 tablets by mouth daily.     escitalopram 20 MG tablet   Commonly known as: LEXAPRO   Take 1 tablet (20 mg total) by mouth daily.      escitalopram 10 MG tablet   Commonly known as: LEXAPRO   Take 1 tablet (10 mg total) by mouth daily.      feeding supplement (JEVITY 1.2) Liqd   Place 1,000 mLs into feeding tube continuous.      hydrOXYzine 25 MG capsule   Commonly known as: VISTARIL   Take 1 capsule (25 mg total) by mouth 3 (three) times daily as needed for itching.      insulin aspart 100 UNIT/ML injection   Commonly known as: novoLOG   Inject 10 Units into the skin 3 (three) times daily before meals.      insulin NPH 100 UNIT/ML injection   Commonly known as: HUMULIN N,NOVOLIN N   Inject 15 Units into the skin 2 (two) times daily.      insulin NPH 100 UNIT/ML injection   Commonly known as: HUMULIN N,NOVOLIN N   5 units SQ 10 am and 10 pm.      multivitamin Chew   Chew 1 tablet by mouth daily.      ondansetron 8 MG disintegrating tablet   Commonly known as: ZOFRAN-ODT   Take 1 tablet (8 mg total) by mouth every 8 (eight) hours as needed for nausea.      pantoprazole 20 MG tablet   Commonly known as: PROTONIX   Take 1 tablet (20 mg total) by mouth daily  at 12 noon.      prenatal multivitamin Tabs   Take 1 tablet by mouth daily.      promethazine 6.25 MG/5ML syrup   Commonly known as: PHENERGAN   Take 20 mLs (25 mg total) by mouth 4 (four) times daily as needed for nausea.      promethazine 25 MG suppository   Commonly known as: PHENERGAN   Place 1 suppository (25 mg total) rectally every 6 (six) hours as needed.           Follow-up Information    Please follow up. ( Keep appt in the high risk clinic on 02-04-11)          Signed: Alanta Yates C. 01/31/2011, 7:46 AM

## 2011-01-31 NOTE — Progress Notes (Signed)
D/c instructions reviewed with pt.  Pt. States understanding of same.  Pt. Sent home with #8 feeding tube in place.  Tube flushed prior to d/c.  Ambulated with staff to car without incident.  D/c'd home with S.O. Other.

## 2011-02-01 ENCOUNTER — Inpatient Hospital Stay (HOSPITAL_COMMUNITY)
Admission: AD | Admit: 2011-02-01 | Discharge: 2011-02-02 | Disposition: A | Discharge status: Home / Self Care | Payer: Medicaid Other | Source: Ambulatory Visit | Attending: Obstetrics & Gynecology | Admitting: Obstetrics & Gynecology

## 2011-02-01 ENCOUNTER — Encounter (HOSPITAL_COMMUNITY): Payer: Self-pay | Admitting: *Deleted

## 2011-02-01 DIAGNOSIS — O219 Vomiting of pregnancy, unspecified: Secondary | ICD-10-CM

## 2011-02-01 DIAGNOSIS — O210 Mild hyperemesis gravidarum: Secondary | ICD-10-CM | POA: Insufficient documentation

## 2011-02-01 NOTE — Progress Notes (Signed)
Pt states the NG tube ws places yesterday here in MAU-states she threw it up last night-states she is still vomiting

## 2011-02-02 ENCOUNTER — Inpatient Hospital Stay (HOSPITAL_COMMUNITY): Payer: Medicaid Other

## 2011-02-02 ENCOUNTER — Encounter (HOSPITAL_COMMUNITY): Payer: Self-pay | Admitting: Family

## 2011-02-02 DIAGNOSIS — O0990 Supervision of high risk pregnancy, unspecified, unspecified trimester: Secondary | ICD-10-CM

## 2011-02-02 MED ORDER — PROMETHAZINE HCL 25 MG/ML IJ SOLN
25.0000 mg | Freq: Once | INTRAMUSCULAR | Status: AC
  Administered 2011-02-02: 25 mg via INTRAMUSCULAR
  Filled 2011-02-02: qty 1

## 2011-02-02 MED ORDER — GI COCKTAIL ~~LOC~~
30.0000 mL | Freq: Once | ORAL | Status: AC
  Administered 2011-02-02: 30 mL via ORAL
  Filled 2011-02-02: qty 30

## 2011-02-02 NOTE — Progress Notes (Signed)
Nurse called from women's unit to place feeding tube.

## 2011-02-02 NOTE — Progress Notes (Signed)
Pt arroused to take gi cocktail.  Pt states please 10 more mins.

## 2011-02-02 NOTE — Progress Notes (Signed)
Notified of completed chest xray report.

## 2011-02-02 NOTE — Progress Notes (Signed)
Bus pass given to pt

## 2011-02-02 NOTE — ED Provider Notes (Signed)
History     Chief Complaint  Patient presents with  . Emesis During Pregnancy   HPI Pt states she threw up NG tube that was placed in MAU yesterday.  Pt continues to vomit and have GERD.    Past Medical History  Diagnosis Date  . Asthma   . Diabetes mellitus   . Mental disorder   . Depression   . Anxiety   . Hypertension     Past Surgical History  Procedure Date  . No past surgeries     Family History  Problem Relation Age of Onset  . Heart disease Mother     CHF  . Anesthesia problems Neg Hx     History  Substance Use Topics  . Smoking status: Never Smoker   . Smokeless tobacco: Never Used  . Alcohol Use: No     not with preg    Allergies:  Allergies  Allergen Reactions  . Morphine And Related Nausea And Vomiting    Prescriptions prior to admission  Medication Sig Dispense Refill  . albuterol (PROVENTIL HFA;VENTOLIN HFA) 108 (90 BASE) MCG/ACT inhaler Inhale 2 puffs into the lungs every 6 (six) hours as needed. For shortness of breath      . calcium carbonate (TUMS - DOSED IN MG ELEMENTAL CALCIUM) 500 MG chewable tablet Chew 2 tablets by mouth daily.        Marland Kitchen escitalopram (LEXAPRO) 10 MG tablet Take 1 tablet (10 mg total) by mouth daily.  30 tablet  6  . escitalopram (LEXAPRO) 20 MG tablet Take 1 tablet (20 mg total) by mouth daily.  30 tablet  3  . hydrOXYzine (VISTARIL) 25 MG capsule Take 1 capsule (25 mg total) by mouth 3 (three) times daily as needed for itching.  30 capsule  0  . insulin aspart (NOVOLOG) 100 UNIT/ML injection Inject 10 Units into the skin 3 (three) times daily before meals.  10 mL  2  . insulin NPH (HUMULIN N) 100 UNIT/ML injection 5 units SQ 10 am and 10 pm.  10 mL  12  . insulin NPH (HUMULIN N,NOVOLIN N) 100 UNIT/ML injection Inject 15 Units into the skin 2 (two) times daily.        . multivitamin (BARIATRIC VIT W/EXTRA C) CHEW Chew 1 tablet by mouth daily.        . Nutritional Supplements (FEEDING SUPPLEMENT, JEVITY 1.2,) LIQD Place  1,000 mLs into feeding tube continuous.  1000 mL  3  . ondansetron (ZOFRAN ODT) 8 MG disintegrating tablet Take 1 tablet (8 mg total) by mouth every 8 (eight) hours as needed for nausea.  30 tablet  3  . pantoprazole (PROTONIX) 20 MG tablet Take 1 tablet (20 mg total) by mouth daily at 12 noon.  30 tablet  6  . Prenatal Vit-Fe Fumarate-FA (PRENATAL MULTIVITAMIN) TABS Take 1 tablet by mouth daily.        . promethazine (PHENERGAN) 25 MG suppository Place 1 suppository (25 mg total) rectally every 6 (six) hours as needed.  12 each  1  . promethazine (PHENERGAN) 6.25 MG/5ML syrup Take 20 mLs (25 mg total) by mouth 4 (four) times daily as needed for nausea.  473 mL  3    Review of Systems  Gastrointestinal: Positive for nausea, vomiting and abdominal pain ( mid epigastric pain).  All other systems reviewed and are negative.   Physical Exam   Blood pressure 126/86, pulse 102, temperature 98.2 F (36.8 C), temperature source Oral, resp. rate 20, height  5\' 6"  (1.676 m), weight 77.792 kg (171 lb 8 oz), last menstrual period 09/19/2010, SpO2 99.00%.  Physical Exam  Constitutional: She is oriented to person, place, and time. She appears well-developed and well-nourished. No distress (calm).  HENT:  Head: Normocephalic.  Mouth/Throat: Mucous membranes are normal. Mucous membranes are not dry.  Neck: Normal range of motion. Neck supple.  Cardiovascular: Normal rate, regular rhythm and normal heart sounds.   Respiratory: Effort normal and breath sounds normal.  GI: Soft. There is no tenderness.       Hypoactive bowel sounds  Neurological: She is alert and oriented to person, place, and time.  Skin: Skin is warm and dry.    MAU Course  Procedures  NG Tube replaced GI cocktail 25mg  IM phenergan  Assessment and Plan  Vomiting in Pregnancy  Plan:  DC to home Keep scheduled appointment  [facility] 02/02/2011, 12:03 AM

## 2011-02-02 NOTE — Progress Notes (Signed)
Loa Socks, CNM at bedside.  Assessment done and poc discussed with pt.

## 2011-02-02 NOTE — Progress Notes (Signed)
Guide wire removed from NG tube with ease.  Bus pass provided for pt.

## 2011-02-02 NOTE — Progress Notes (Signed)
Tiffany, RN from AICU here to place feeding tube.

## 2011-02-02 NOTE — ED Provider Notes (Signed)
Attestation of Attending Supervision of Advanced Practitioner: Evaluation and management procedures were performed by the PA/NP/CNM/OB Fellow under my supervision/collaboration. Chart reviewed, and agree with management and plan.  Verita Schneiders, M.D. 02/02/2011 7:27 AM

## 2011-02-04 ENCOUNTER — Ambulatory Visit (INDEPENDENT_AMBULATORY_CARE_PROVIDER_SITE_OTHER): Payer: Medicaid Other | Admitting: Physician Assistant

## 2011-02-04 VITALS — BP 120/86 | Temp 97.0°F | Wt 174.9 lb

## 2011-02-04 DIAGNOSIS — O10019 Pre-existing essential hypertension complicating pregnancy, unspecified trimester: Secondary | ICD-10-CM

## 2011-02-04 DIAGNOSIS — O10919 Unspecified pre-existing hypertension complicating pregnancy, unspecified trimester: Secondary | ICD-10-CM

## 2011-02-04 LAB — POCT URINALYSIS DIP (DEVICE)
Bilirubin Urine: NEGATIVE
Glucose, UA: 100 mg/dL — AB
Ketones, ur: NEGATIVE mg/dL
Leukocytes, UA: NEGATIVE
Nitrite: NEGATIVE
pH: 7 (ref 5.0–8.0)

## 2011-02-04 NOTE — Progress Notes (Signed)
Pulse 91. No vaginal discharge. Pt states takes insulin when able to eat meals. Pt states this is the "best she has felt".

## 2011-02-04 NOTE — Progress Notes (Signed)
States she feels "GREAT!" today, best she has felt in pregnancy. Reports FBS: highest 116, 2pp: in the 90s, highest 142. No log book with her today. NG tube out x 3 days. Reports tolerating solids, no vomiting. AFP only today. Encouraged to bring BS log/meter to next visit.

## 2011-02-04 NOTE — Patient Instructions (Signed)
AFP Maternal This is a routine screen (tests) used to check for fetal abnormalities such as Down syndrome and neural tube defects. Down Syndrome is a chromosomal abnormality, sometimes called Trisomy 29. Neural tube defects are serious birth defects. The brain, spinal cord, or their coverings do not develop completely. Women should be tested in the 15th to 20th week of pregnancy. The msAFP screen involves three or four tests that measure substances found in the blood that make the testing better. During development, AFP levels in fetal blood and amniotic fluid rise until about 12 weeks. The levels then gradually fall until birth. AFP is a protein produce by fetal tissue. AFP crosses the placenta and appears in the maternal blood. A baby with an open neural tube defect has an opening in its spine, head, or abdominal wall that allows higher-than-usual amounts of AFP to pass into the mother's blood. If a screen is positive, more tests are needed to make a diagnosis. These include ultrasound and perhaps amniocentesis (checking the fluid that surrounds the baby). These tests are used to help women and their caregivers make decisions about the management of their pregnancies. In pregnancies where the fetus is carrying the chromosomal defect that results in Down syndrome, the levels of AFP and unconjugated estriol tend to be low and hCG and inhibin A levels high.  PREPARATION FOR TEST Blood is drawn from a vein in your arm usually between the 15th and 20th weeks of pregnancy. Four different tests on your blood are done. These are AFP, hCG, unconjugated estriol, and inhibin A. The combination of tests produces a more accurate result. NORMAL FINDINGS   Adult: less than 40ng/mL or less than 40 mg/L (SI units)   Child younger than1 year: less than 30 ng/mL  Ranges are stratified by weeks of gestation and vary among laboratories. Ranges for normal findings may vary among different laboratories and hospitals. You  should always check with your doctor after having lab work or other tests done to discuss the meaning of your test results and whether your values are considered within normal limits. MEANING OF TEST  These are screening tests. Not all fetal abnormalities will give positive test results. Of all women who have positive AFP screening results, only a very small number of them have babies who actually have a neural tube defect or chromosomal abnormality. Your caregiver will go over the test results with you and discuss the importance and meaning of your results, as well as treatment options and the need for additional tests if necessary. OBTAINING THE TEST RESULTS It is your responsibility to obtain your test results. Ask the lab or department performing the test when and how you will get your results. Document Released: 01/27/2004 Document Revised: 09/16/2010 Document Reviewed: 12/09/2007 [facility] Patient Information 2012 [employer].

## 2011-02-08 ENCOUNTER — Inpatient Hospital Stay (HOSPITAL_COMMUNITY)
Admission: AD | Admit: 2011-02-08 | Discharge: 2011-02-08 | Disposition: A | Discharge status: Home / Self Care | Payer: Medicaid Other | Source: Ambulatory Visit | Attending: Obstetrics & Gynecology | Admitting: Obstetrics & Gynecology

## 2011-02-08 ENCOUNTER — Encounter (HOSPITAL_COMMUNITY): Payer: Self-pay | Admitting: *Deleted

## 2011-02-08 ENCOUNTER — Inpatient Hospital Stay (HOSPITAL_COMMUNITY): Payer: Medicaid Other

## 2011-02-08 DIAGNOSIS — F1310 Sedative, hypnotic or anxiolytic abuse, uncomplicated: Secondary | ICD-10-CM | POA: Insufficient documentation

## 2011-02-08 DIAGNOSIS — O210 Mild hyperemesis gravidarum: Secondary | ICD-10-CM

## 2011-02-08 DIAGNOSIS — O0990 Supervision of high risk pregnancy, unspecified, unspecified trimester: Secondary | ICD-10-CM

## 2011-02-08 DIAGNOSIS — E119 Type 2 diabetes mellitus without complications: Secondary | ICD-10-CM

## 2011-02-08 DIAGNOSIS — O99340 Other mental disorders complicating pregnancy, unspecified trimester: Secondary | ICD-10-CM | POA: Insufficient documentation

## 2011-02-08 DIAGNOSIS — F1210 Cannabis abuse, uncomplicated: Secondary | ICD-10-CM | POA: Insufficient documentation

## 2011-02-08 DIAGNOSIS — O10919 Unspecified pre-existing hypertension complicating pregnancy, unspecified trimester: Secondary | ICD-10-CM

## 2011-02-08 LAB — RAPID URINE DRUG SCREEN, HOSP PERFORMED
Barbiturates: POSITIVE — AB
Benzodiazepines: NOT DETECTED
Cocaine: NOT DETECTED

## 2011-02-08 LAB — BASIC METABOLIC PANEL
CO2: 24 mEq/L (ref 19–32)
Calcium: 9.1 mg/dL (ref 8.4–10.5)
Chloride: 99 mEq/L (ref 96–112)
Creatinine, Ser: 1.01 mg/dL (ref 0.50–1.10)
Glucose, Bld: 90 mg/dL (ref 70–99)

## 2011-02-08 LAB — URINALYSIS, DIPSTICK ONLY
Bilirubin Urine: NEGATIVE
Hgb urine dipstick: NEGATIVE
Ketones, ur: 40 mg/dL — AB
Nitrite: NEGATIVE
Specific Gravity, Urine: 1.015 (ref 1.005–1.030)
Urobilinogen, UA: 0.2 mg/dL (ref 0.0–1.0)
pH: 7 (ref 5.0–8.0)

## 2011-02-08 LAB — URINALYSIS, ROUTINE W REFLEX MICROSCOPIC
Bilirubin Urine: NEGATIVE
Leukocytes, UA: NEGATIVE
Nitrite: NEGATIVE
Specific Gravity, Urine: 1.02 (ref 1.005–1.030)
Urobilinogen, UA: 0.2 mg/dL (ref 0.0–1.0)
pH: 8 (ref 5.0–8.0)

## 2011-02-08 LAB — URINE MICROSCOPIC-ADD ON

## 2011-02-08 MED ORDER — SODIUM CHLORIDE 0.9 % IV BOLUS (SEPSIS)
1000.0000 mL | Freq: Once | INTRAVENOUS | Status: AC
  Administered 2011-02-08: 1000 mL via INTRAVENOUS

## 2011-02-08 MED ORDER — PANTOPRAZOLE SODIUM 20 MG PO TBEC
20.0000 mg | DELAYED_RELEASE_TABLET | Freq: Once | ORAL | Status: AC
  Administered 2011-02-08: 20 mg via ORAL
  Filled 2011-02-08: qty 1

## 2011-02-08 MED ORDER — PROMETHAZINE HCL 25 MG RE SUPP
25.0000 mg | Freq: Once | RECTAL | Status: AC
  Administered 2011-02-08: 25 mg via RECTAL
  Filled 2011-02-08: qty 1

## 2011-02-08 NOTE — ED Notes (Signed)
FOB expressed concern that pt is acting different and may have used illegal drugs- would like her tested.  Resident notified.

## 2011-02-08 NOTE — Progress Notes (Signed)
Feeding tube has been out for a few days ? Wk, had been doing good until yesterday.  Does not feel needs tube replaced. Pain started yesterday when started throwing up again.

## 2011-02-08 NOTE — ED Provider Notes (Signed)
History     Chief Complaint  Patient presents with  . Emesis During Pregnancy   HPI Patient here for severe vomiting.  She has presented multiple times during this pregnancy for the same. This episode started yesterday.  OB History    Grav Para Term Preterm Abortions TAB SAB Ect Mult Living   4 2 1 1 1  0 0 1 0 2      Past Medical History  Diagnosis Date  . Asthma   . Diabetes mellitus   . Mental disorder   . Depression   . Anxiety   . Hypertension     Past Surgical History  Procedure Date  . No past surgeries     Family History  Problem Relation Age of Onset  . Heart disease Mother     CHF  . Anesthesia problems Neg Hx   . Diabetes Sister     History  Substance Use Topics  . Smoking status: Never Smoker   . Smokeless tobacco: Never Used  . Alcohol Use: No     not with preg    Allergies:  Allergies  Allergen Reactions  . Morphine And Related Nausea And Vomiting    Prescriptions prior to admission  Medication Sig Dispense Refill  . albuterol (PROVENTIL HFA;VENTOLIN HFA) 108 (90 BASE) MCG/ACT inhaler Inhale 2 puffs into the lungs every 6 (six) hours as needed. For shortness of breath      . calcium carbonate (TUMS - DOSED IN MG ELEMENTAL CALCIUM) 500 MG chewable tablet Chew 2 tablets by mouth daily.        Marland Kitchen escitalopram (LEXAPRO) 20 MG tablet Take 1 tablet (20 mg total) by mouth daily.  30 tablet  3  . hydrOXYzine (VISTARIL) 25 MG capsule Take 25 mg by mouth 3 (three) times daily as needed. For itching      . insulin aspart (NOVOLOG) 100 UNIT/ML injection Inject 10 Units into the skin 3 (three) times daily before meals.  10 mL  2  . insulin NPH (HUMULIN N,NOVOLIN N) 100 UNIT/ML injection Inject 5 Units into the skin 2 (two) times daily. 5 units SQ 10 am and 10 pm.      . Nutritional Supplements (FEEDING SUPPLEMENT, JEVITY 1.2,) LIQD Place 1,000 mLs into feeding tube continuous.  1000 mL  3  . pantoprazole (PROTONIX) 20 MG tablet Take 1 tablet (20 mg  total) by mouth daily at 12 noon.  30 tablet  6  . Prenatal Vit-Fe Fumarate-FA (PRENATAL MULTIVITAMIN) TABS Take 1 tablet by mouth daily.        . promethazine (PHENERGAN) 25 MG suppository Place 25 mg rectally every 6 (six) hours as needed. For nausea      . DISCONTD: hydrOXYzine (VISTARIL) 25 MG capsule Take 1 capsule (25 mg total) by mouth 3 (three) times daily as needed for itching.  30 capsule  0  . DISCONTD: insulin NPH (HUMULIN N) 100 UNIT/ML injection 5 units SQ 10 am and 10 pm.  10 mL  12  . DISCONTD: promethazine (PHENERGAN) 25 MG suppository Place 1 suppository (25 mg total) rectally every 6 (six) hours as needed.  12 each  1    ROS Physical Exam   Blood pressure 106/67, pulse 87, temperature 98.1 F (36.7 C), temperature source Oral, resp. rate 18, last menstrual period 09/19/2010, SpO2 99.00%.  Physical Exam  MAU Course  Procedures  MDM Will try PR Phenergan now. Will refrain from using IV Phenergan at this time. Patient tolerating PO Sprite  and crackers currently. Encouraged hydration. Patient does have ketones in urine but if she is able to take adequate PO, will avoid IV fluids for now.   @ 15:00 Patient still feeling nauseous although has not vomited. Received PR Phenergan about 50 minutes ago. She drank about 8 oz of her Sprite.  Patient will try drinking water.  FOB took me aside and expressed a few thoughts/concerns: 1. He thinks NG tube helped patient a lot. She was able to eat and was not having as much nausea. The NGT put in previously came out during a vomiting episode.  2. He is not home most of the day and is concerned about patient using drugs. Patient has UDS pending currently for this admission.   Will re-evaluate patient in another 30 minutes.   @ 15:30 Patient not feeling that much better. Still nauseous. Requesting IV fluids. Only drank a few sips of water.  Will give 1 L NS bolus now and re-evaluate.  Patient has not vomited since arrival to  MAU.  @ 16:30 Patient amenable to NGT placement. Will also request HH SN to see patient to make sure NGT in place (had 2 placed before but both came out after 1 day; adhesive had come off and then NGT came out shortly after with vomiting).  @ 17:10 UDS positive for barbiturates and THC (patient used marijuana 2 weeks ago when asked). NGT being placed now. Patient almost done with fluids bolus. Reports still nauseated. But no vomiting since she has been here. Asking for "real food". Does not want crackers and refuses to drink water or Sprite. Encouraged Sprite since she is diabetic.  Willette Cluster, clinical nurse specialists, has been called and says she will come by to discuss [facility] SN with patient.  @1818 : reevaluated patient. Having trouble placing NGT. Patient eating solid food (chicken tenders). Will attempt to place NGT after patient eats, or consider DC to home if tolerating PO well. Also working to arrange home health to see patient if NGT placed to see why patient keeps losing access of NGT. Also working with Social Work about (+)drug screen. Anticipate DC to home at this time, will continue to monitor.   @20 :00 - care transferred to PM team, please refer to their documentation for further information and treatment plan  Assessment and Plan  [id number] @ 20 2/7 weeks presenting for hyperemesis.  Hansel Starling 02/08/2011, 6:18 PM

## 2011-02-08 NOTE — ED Notes (Signed)
Juliet Rude from care management in with pt, discussing home care.

## 2011-02-08 NOTE — ED Notes (Signed)
Dr Felton Clinton aware of pt's tolerance of food.  When asked pt regarding feeding tube, requested to check back in 30 min.

## 2011-02-08 NOTE — ED Notes (Addendum)
Diet served.  Juice given per request.  Pt sitting up to eat.  IV infused. Clamped off, waiting on pt outcome of eating.

## 2011-02-08 NOTE — Progress Notes (Signed)
Received call around 1715 today to see patient regarding possible need of home health. Nurse Care Manager spoke to patient and she states she is active with [facility] and would like to continue with [facility] for any home health needs. Patient states she has the feeding tube pump at her home and also she has cases of Jevity feeds at her home also and that she does not need any equipment. Nurse care manager called [facility] [phone] and spoke to them and they took message and said the triage Guerry Minors would call me back regarding a possible referral. At 1745 feeding tube unable to be placed at this time and patient is going to try food per [facility] her RN. If unable to tolerate food and feeding tube is placed please call nurse care manager for needs regarding this patient to set this [place] need up at home.  Patient does not have an active phone she states but her address was verified as [place]. [place] [address].  At 1800 unknown at this time if she needs home health or not at this time per RN. She has ordered a food tray and see is going to see how she tolerates a regular diet to determine whether she needs a feeding tube or not.  Jolene RN in MAU will call nurse care manager back at [phone] this pm when a determination is made if [place] is needed.

## 2011-02-08 NOTE — Progress Notes (Signed)
Patient states she has had a feeding tube since her last admission. States it came out about 2 days ago but she was trying to stay at home. Nausea and vomiting have gotten worse and is having a slight lower abdominal pain. Patient arrived by EMS.

## 2011-02-08 NOTE — Progress Notes (Signed)
02/08/2011 1740 Attempted insertion of Nasoenteric feeding tube #8 FR via right nare by 2 RN's without success. Previous attempts were unsuccessful at access via left nare. Pt states that she wants to eat food & does not want the tube reinserted. MAU RN to call MD.

## 2011-02-08 NOTE — ED Notes (Signed)
Crackers and p.o. Fluids given per pt request

## 2011-02-08 NOTE — ED Notes (Signed)
States protonix helped with heartburn, but is still feeling sick to her stomach. Now asking for water instead of sprite.

## 2011-02-08 NOTE — ED Provider Notes (Signed)
History     Chief Complaint  Patient presents with  . Emesis During Pregnancy   HPI Patient here for severe vomiting.  She has presented multiple times during this pregnancy for the same. This episode started yesterday.  OB History    Grav Para Term Preterm Abortions TAB SAB Ect Mult Living   4 2 1 1 1  0 0 1 0 2      Past Medical History  Diagnosis Date  . Asthma   . Diabetes mellitus   . Mental disorder   . Depression   . Anxiety   . Hypertension     Past Surgical History  Procedure Date  . No past surgeries     Family History  Problem Relation Age of Onset  . Heart disease Mother     CHF  . Anesthesia problems Neg Hx   . Diabetes Sister     History  Substance Use Topics  . Smoking status: Never Smoker   . Smokeless tobacco: Never Used  . Alcohol Use: No     not with preg    Allergies:  Allergies  Allergen Reactions  . Morphine And Related Nausea And Vomiting    Prescriptions prior to admission  Medication Sig Dispense Refill  . albuterol (PROVENTIL HFA;VENTOLIN HFA) 108 (90 BASE) MCG/ACT inhaler Inhale 2 puffs into the lungs every 6 (six) hours as needed. For shortness of breath      . calcium carbonate (TUMS - DOSED IN MG ELEMENTAL CALCIUM) 500 MG chewable tablet Chew 2 tablets by mouth daily.        Marland Kitchen escitalopram (LEXAPRO) 20 MG tablet Take 1 tablet (20 mg total) by mouth daily.  30 tablet  3  . hydrOXYzine (VISTARIL) 25 MG capsule Take 25 mg by mouth 3 (three) times daily as needed. For itching      . insulin aspart (NOVOLOG) 100 UNIT/ML injection Inject 10 Units into the skin 3 (three) times daily before meals.  10 mL  2  . insulin NPH (HUMULIN N,NOVOLIN N) 100 UNIT/ML injection Inject 5 Units into the skin 2 (two) times daily. 5 units SQ 10 am and 10 pm.      . Nutritional Supplements (FEEDING SUPPLEMENT, JEVITY 1.2,) LIQD Place 1,000 mLs into feeding tube continuous.  1000 mL  3  . pantoprazole (PROTONIX) 20 MG tablet Take 1 tablet (20 mg  total) by mouth daily at 12 noon.  30 tablet  6  . Prenatal Vit-Fe Fumarate-FA (PRENATAL MULTIVITAMIN) TABS Take 1 tablet by mouth daily.        . promethazine (PHENERGAN) 25 MG suppository Place 25 mg rectally every 6 (six) hours as needed. For nausea      . DISCONTD: hydrOXYzine (VISTARIL) 25 MG capsule Take 1 capsule (25 mg total) by mouth 3 (three) times daily as needed for itching.  30 capsule  0  . DISCONTD: insulin NPH (HUMULIN N) 100 UNIT/ML injection 5 units SQ 10 am and 10 pm.  10 mL  12  . DISCONTD: promethazine (PHENERGAN) 25 MG suppository Place 1 suppository (25 mg total) rectally every 6 (six) hours as needed.  12 each  1    ROS Physical Exam   Blood pressure 108/70, pulse 100, temperature 98.1 F (36.7 C), temperature source Oral, resp. rate 16, last menstrual period 09/19/2010, SpO2 99.00%.  Physical Exam  MAU Course  Procedures  MDM Will try PR Phenergan now. Will refrain from using IV Phenergan at this time. Patient tolerating PO Sprite  and crackers currently. Encouraged hydration. Patient does have ketones in urine but if she is able to take adequate PO, will avoid IV fluids for now.   @ 15:00 Patient still feeling nauseous although has not vomited. Received PR Phenergan about 50 minutes ago. She drank about 8 oz of her Sprite.  Patient will try drinking water.  FOB took me aside and expressed a few thoughts/concerns: 1. He thinks NG tube helped patient a lot. She was able to eat and was not having as much nausea. The NGT put in previously came out during a vomiting episode.  2. He is not home most of the day and is concerned about patient using drugs. Patient has UDS pending currently for this admission.   Will re-evaluate patient in another 30 minutes.   @ 15:30 Patient not feeling that much better. Still nauseous. Requesting IV fluids. Only drank a few sips of water.  Will give 1 L NS bolus now and re-evaluate.  Patient has not vomited since arrival to  MAU.  @ 16:30 Patient amenable to NGT placement. Will also request HH SN to see patient to make sure NGT in place (had 2 placed before but both came out after 1 day; adhesive had come off and then NGT came out shortly after with vomiting).  @ 17:10 UDS positive for barbiturates and THC (patient used marijuana 2 weeks ago when asked). NGT being placed now. Patient almost done with fluids bolus. Reports still nauseated. But no vomiting since she has been here. Asking for "real food". Does not want crackers and refuses to drink water or Sprite. Encouraged Sprite since she is diabetic.  Willette Cluster, clinical nurse specialists, has been called and says she will come by to discuss [facility] SN with patient.  Assessment and Plan  [id number] @ 20 2/7 weeks presenting for hyperemesis.  [place], ANGELA 02/08/2011, 2:03 PM

## 2011-02-10 NOTE — ED Provider Notes (Signed)
Agree with note. 

## 2011-02-12 NOTE — ED Provider Notes (Signed)
I have seen this patient and agree with note by resident.  Report and handoff of care to [place], [place], and Dr Thomes Dinning at [facility].

## 2011-02-18 ENCOUNTER — Encounter: Payer: Self-pay | Admitting: Obstetrics & Gynecology

## 2011-02-21 ENCOUNTER — Encounter (HOSPITAL_COMMUNITY): Payer: Self-pay | Admitting: *Deleted

## 2011-02-21 ENCOUNTER — Inpatient Hospital Stay (HOSPITAL_COMMUNITY)
Admission: AD | Admit: 2011-02-21 | Discharge: 2011-02-21 | Disposition: A | Discharge status: Home / Self Care | Payer: Medicaid Other | Source: Ambulatory Visit | Attending: Family Medicine | Admitting: Family Medicine

## 2011-02-21 DIAGNOSIS — O10919 Unspecified pre-existing hypertension complicating pregnancy, unspecified trimester: Secondary | ICD-10-CM

## 2011-02-21 DIAGNOSIS — O24919 Unspecified diabetes mellitus in pregnancy, unspecified trimester: Secondary | ICD-10-CM | POA: Insufficient documentation

## 2011-02-21 DIAGNOSIS — E119 Type 2 diabetes mellitus without complications: Secondary | ICD-10-CM | POA: Insufficient documentation

## 2011-02-21 DIAGNOSIS — O10019 Pre-existing essential hypertension complicating pregnancy, unspecified trimester: Secondary | ICD-10-CM | POA: Insufficient documentation

## 2011-02-21 DIAGNOSIS — K219 Gastro-esophageal reflux disease without esophagitis: Secondary | ICD-10-CM

## 2011-02-21 LAB — URINALYSIS, MICROSCOPIC ONLY
Bilirubin Urine: NEGATIVE
Glucose, UA: NEGATIVE mg/dL
Hgb urine dipstick: NEGATIVE
Ketones, ur: 15 mg/dL — AB
Leukocytes, UA: NEGATIVE
Nitrite: NEGATIVE
Protein, ur: 100 mg/dL — AB
Specific Gravity, Urine: 1.02 (ref 1.005–1.030)
Urobilinogen, UA: 0.2 mg/dL (ref 0.0–1.0)
pH: 6.5 (ref 5.0–8.0)

## 2011-02-21 MED ORDER — FAMOTIDINE 20 MG PO TABS
40.0000 mg | ORAL_TABLET | Freq: Every day | ORAL | Status: DC
  Administered 2011-02-21: 40 mg via ORAL
  Filled 2011-02-21 (×2): qty 1

## 2011-02-21 MED ORDER — OMEPRAZOLE 40 MG PO CPDR: 40.0000 mg | DELAYED_RELEASE_CAPSULE | Freq: Every day | ORAL | Status: DC

## 2011-02-21 MED ORDER — ONDANSETRON 8 MG PO TBDP
8.0000 mg | ORAL_TABLET | Freq: Once | ORAL | Status: AC
  Administered 2011-02-21: 8 mg via ORAL
  Filled 2011-02-21: qty 1

## 2011-02-21 NOTE — Progress Notes (Signed)
C/o N&V and diarrhea for past 4 days; insulin dependent diabetic and has not been checking her sugars for a week because she is out of her strips;

## 2011-02-21 NOTE — ED Provider Notes (Signed)
Agree with above note.  [name]. 02/21/2011 7:28 PM

## 2011-02-21 NOTE — ED Provider Notes (Signed)
History     CSN: [id number]  Arrival date & time 02/21/11  1309   None     Chief Complaint  Patient presents with  . Nausea    (Consider location/radiation/quality/duration/timing/severity/associated sxs/prior treatment) HPI [id number] insulin dependant diabetic @ [redacted] wks gestation in with c/o severe heartburn, nausea, and vomiting x 4 days.  Past Medical History  Diagnosis Date  . Asthma   . Diabetes mellitus   . Mental disorder   . Depression   . Anxiety   . Hypertension     Past Surgical History  Procedure Date  . No past surgeries     Family History  Problem Relation Age of Onset  . Heart disease Mother     CHF  . Anesthesia problems Neg Hx   . Diabetes Sister     History  Substance Use Topics  . Smoking status: Never Smoker   . Smokeless tobacco: Never Used  . Alcohol Use: No     not with preg    OB History    Grav Para Term Preterm Abortions TAB SAB Ect Mult Living   4 2 1 1 1  0 0 1 0 2      Review of Systems  Constitutional: Negative.   HENT: Negative.   Eyes: Negative.   Cardiovascular: Negative.   Gastrointestinal: Positive for nausea and vomiting.       GERD  Genitourinary: Negative.   Musculoskeletal: Negative.   Neurological: Negative.   Hematological: Negative.   Psychiatric/Behavioral: Negative.     Allergies  Morphine and related  Home Medications  No current outpatient prescriptions on file.  BP 123/79  Pulse 88  Temp(Src) 97 F (36.1 C) (Oral)  Resp 18  Ht 5\' 6"  (1.676 m)  Wt 81.194 kg (179 lb)  BMI 28.89 kg/m2  LMP 09/19/2010  Physical Exam  Constitutional: She is oriented to person, place, and time. She appears well-developed and well-nourished. No distress.  HENT:  Head: Normocephalic.  Neck: Normal range of motion.  Cardiovascular: Normal rate, regular rhythm, normal heart sounds and intact distal pulses.   Pulmonary/Chest: Effort normal and breath sounds normal.  Abdominal: Soft. Bowel sounds are normal.    Musculoskeletal: Normal range of motion.  Neurological: She is alert and oriented to person, place, and time. She has normal reflexes.  Skin: Skin is warm and dry.  Psychiatric: She has a normal mood and affect. Her behavior is normal. Judgment and thought content normal.    ED Course  Procedures (including critical care time)   Labs Reviewed  URINALYSIS, WITH MICROSCOPIC   No results found.   1. Supervision of high-risk pregnancy   2. Chronic hypertension in pregnancy   3. Type 2 diabetes mellitus       MDM  Oral zofran, CBG 91, med for GERD, introduce cl liquids.

## 2011-02-22 ENCOUNTER — Encounter (HOSPITAL_COMMUNITY): Payer: Self-pay | Admitting: *Deleted

## 2011-02-22 ENCOUNTER — Inpatient Hospital Stay (HOSPITAL_COMMUNITY)
Admission: AD | Admit: 2011-02-22 | Discharge: 2011-02-25 | DRG: 781 | Disposition: A | Discharge status: Home / Self Care | Payer: Medicaid Other | Source: Ambulatory Visit | Attending: Family Medicine | Admitting: Family Medicine

## 2011-02-22 ENCOUNTER — Encounter: Payer: Self-pay | Admitting: *Deleted

## 2011-02-22 DIAGNOSIS — F3289 Other specified depressive episodes: Secondary | ICD-10-CM | POA: Diagnosis present

## 2011-02-22 DIAGNOSIS — O10019 Pre-existing essential hypertension complicating pregnancy, unspecified trimester: Secondary | ICD-10-CM

## 2011-02-22 DIAGNOSIS — F329 Major depressive disorder, single episode, unspecified: Secondary | ICD-10-CM

## 2011-02-22 DIAGNOSIS — R1115 Cyclical vomiting syndrome unrelated to migraine: Secondary | ICD-10-CM

## 2011-02-22 DIAGNOSIS — O210 Mild hyperemesis gravidarum: Secondary | ICD-10-CM

## 2011-02-22 DIAGNOSIS — O99340 Other mental disorders complicating pregnancy, unspecified trimester: Secondary | ICD-10-CM

## 2011-02-22 DIAGNOSIS — E119 Type 2 diabetes mellitus without complications: Secondary | ICD-10-CM | POA: Diagnosis present

## 2011-02-22 DIAGNOSIS — O24919 Unspecified diabetes mellitus in pregnancy, unspecified trimester: Secondary | ICD-10-CM | POA: Diagnosis present

## 2011-02-22 LAB — URINALYSIS, ROUTINE W REFLEX MICROSCOPIC
Glucose, UA: NEGATIVE mg/dL
Hgb urine dipstick: NEGATIVE
Specific Gravity, Urine: 1.025 (ref 1.005–1.030)
Urobilinogen, UA: 1 mg/dL (ref 0.0–1.0)
pH: 6 (ref 5.0–8.0)

## 2011-02-22 LAB — URINE MICROSCOPIC-ADD ON

## 2011-02-22 LAB — GLUCOSE, CAPILLARY
Glucose-Capillary: 95 mg/dL (ref 70–99)
Glucose-Capillary: 90 mg/dL (ref 70–99)

## 2011-02-22 LAB — BASIC METABOLIC PANEL
Calcium: 9 mg/dL (ref 8.4–10.5)
Creatinine, Ser: 1.14 mg/dL — ABNORMAL HIGH (ref 0.50–1.10)
GFR calc Af Amer: 72 mL/min — ABNORMAL LOW (ref >90)
GFR calc non Af Amer: 62 mL/min — ABNORMAL LOW (ref >90)
Sodium: 134 mEq/L — ABNORMAL LOW (ref 135–145)

## 2011-02-22 LAB — RAPID URINE DRUG SCREEN, HOSP PERFORMED
Cocaine: NOT DETECTED
Opiates: NOT DETECTED

## 2011-02-22 MED ORDER — PRENATAL MULTIVITAMIN CH
1.0000 | ORAL_TABLET | Freq: Every day | ORAL | Status: DC
  Administered 2011-02-23 – 2011-02-24 (×2): 1 via ORAL
  Filled 2011-02-22 (×3): qty 1

## 2011-02-22 MED ORDER — PANTOPRAZOLE SODIUM 40 MG PO TBEC
80.0000 mg | DELAYED_RELEASE_TABLET | Freq: Every day | ORAL | Status: DC
  Administered 2011-02-23 – 2011-02-25 (×3): 80 mg via ORAL
  Filled 2011-02-22 (×4): qty 2

## 2011-02-22 MED ORDER — ALBUTEROL SULFATE HFA 108 (90 BASE) MCG/ACT IN AERS
2.0000 | INHALATION_SPRAY | Freq: Four times a day (QID) | RESPIRATORY_TRACT | Status: DC | PRN
  Filled 2011-02-22: qty 6.7

## 2011-02-22 MED ORDER — PROMETHAZINE HCL 25 MG/ML IJ SOLN
12.5000 mg | INTRAMUSCULAR | Status: DC | PRN
  Administered 2011-02-22 – 2011-02-25 (×11): 12.5 mg via INTRAVENOUS
  Filled 2011-02-22 (×12): qty 1

## 2011-02-22 MED ORDER — FAMOTIDINE IN NACL 20-0.9 MG/50ML-% IV SOLN
INTRAVENOUS | Status: AC
  Administered 2011-02-22: 20 mg via INTRAVENOUS
  Filled 2011-02-22: qty 50

## 2011-02-22 MED ORDER — ONDANSETRON HCL 4 MG/2ML IJ SOLN
4.0000 mg | Freq: Four times a day (QID) | INTRAMUSCULAR | Status: DC | PRN
  Administered 2011-02-22 – 2011-02-24 (×3): 4 mg via INTRAVENOUS
  Filled 2011-02-22 (×4): qty 2

## 2011-02-22 MED ORDER — FAMOTIDINE IN NACL 20-0.9 MG/50ML-% IV SOLN
20.0000 mg | Freq: Once | INTRAVENOUS | Status: AC
  Administered 2011-02-22: 20 mg via INTRAVENOUS

## 2011-02-22 MED ORDER — SODIUM CHLORIDE 0.45 % IV SOLN
INTRAVENOUS | Status: DC
  Administered 2011-02-22 – 2011-02-24 (×7): via INTRAVENOUS
  Filled 2011-02-22 (×9): qty 1000

## 2011-02-22 MED ORDER — TEMAZEPAM 15 MG PO CAPS: 15.0000 mg | ORAL_CAPSULE | Freq: Every evening | ORAL | Status: DC | PRN

## 2011-02-22 MED ORDER — ALUM & MAG HYDROXIDE-SIMETH 200-200-20 MG/5ML PO SUSP
30.0000 mL | ORAL | Status: DC | PRN
  Administered 2011-02-23 – 2011-02-25 (×6): 30 mL via ORAL
  Filled 2011-02-22 (×6): qty 30

## 2011-02-22 MED ORDER — POTASSIUM CHLORIDE IN NACL 20-0.45 MEQ/L-% IV SOLN: INTRAVENOUS | Status: DC

## 2011-02-22 MED ORDER — ZOLPIDEM TARTRATE 10 MG PO TABS
10.0000 mg | ORAL_TABLET | Freq: Every evening | ORAL | Status: DC | PRN
  Administered 2011-02-22: 10 mg via ORAL
  Filled 2011-02-22: qty 1

## 2011-02-22 MED ORDER — ESCITALOPRAM OXALATE 20 MG PO TABS
20.0000 mg | ORAL_TABLET | Freq: Every day | ORAL | Status: DC
  Administered 2011-02-24: 20 mg via ORAL
  Filled 2011-02-22 (×4): qty 1

## 2011-02-22 MED ORDER — INSULIN NPH (HUMAN) (ISOPHANE) 100 UNIT/ML ~~LOC~~ SUSP
5.0000 [IU] | SUBCUTANEOUS | Status: DC
  Administered 2011-02-22 – 2011-02-25 (×6): 5 [IU] via SUBCUTANEOUS

## 2011-02-22 MED ORDER — SODIUM CHLORIDE 0.9 % IJ SOLN
INTRAMUSCULAR | Status: AC
  Filled 2011-02-22: qty 9

## 2011-02-22 NOTE — Progress Notes (Signed)
Pt in via ems c/o nausea and vomiting since last Thursday.  States she is unable to keep anything down.  Reports mild lower abdominal pain.

## 2011-02-22 NOTE — H&P (Signed)
[name] is a 31 y.o. female presenting for n/v of pregnancy.  Has had multiple visits to MAU and hospital admissions for the same.  Reports that PO Zofran and Phenergan are unable to manage her nausea.  She reports vomiting "all day long" but is unable to provide a number of times.  She reports "I stopped counting after 2".  S/O reports that she has not kept any food down for several days.    . Maternal Medical History:  Reason for admission: Reason for admission: nausea.  Contractions: Pt denies contractions  Fetal activity: Perceived fetal activity is normal.   Last perceived fetal movement was within the past 12 hours.    Prenatal Complications - Diabetes: gestational. Diabetes is managed by insulin injections.      OB History    Grav Para Term Preterm Abortions TAB SAB Ect Mult Living   4 2 1 1 1  0 0 1 0 2     Past Medical History  Diagnosis Date  . Asthma   . Diabetes mellitus   . Mental disorder   . Depression   . Anxiety   . Hypertension    Past Surgical History  Procedure Date  . No past surgeries    Family History: family history includes Diabetes in her sister and Heart disease in her mother.  There is no history of Anesthesia problems. Social History:  reports that she has never smoked. She has never used smokeless tobacco. She reports that she uses illicit drugs (Cocaine and Marijuana). She reports that she does not drink alcohol.  Review of Systems  Constitutional: Positive for weight loss and malaise/fatigue. Negative for fever and chills.  Eyes: Negative for blurred vision and double vision.  Respiratory: Negative for cough and shortness of breath.   Cardiovascular: Negative.   Gastrointestinal: Positive for nausea, vomiting and abdominal pain.  Genitourinary: Negative.   Musculoskeletal: Negative.   Neurological: Negative.  Negative for headaches.  Psychiatric/Behavioral: Positive for depression.      Blood pressure 116/70, pulse 83, temperature  98.6 F (37 C), temperature source Oral, resp. rate 18, height 5\' 6"  (1.676 m), weight 79.017 kg (174 lb 3.2 oz), last menstrual period 09/19/2010. Maternal Exam:  Abdomen: Patient reports no abdominal tenderness. Cervix: not evaluated.   Fetal Exam Fetal Monitor Review: Mode: hand-held doppler probe.   Baseline rate: FHT 164.      Physical Exam  Constitutional: She is oriented to person, place, and time. She appears well-developed.  Neck: Normal range of motion.  Cardiovascular: Normal rate, regular rhythm and normal heart sounds.   Respiratory: Effort normal and breath sounds normal.  GI: Soft.  Musculoskeletal: Normal range of motion.  Neurological: She is alert and oriented to person, place, and time.  Skin: Skin is warm and dry.  Psychiatric: She has a normal mood and affect. Her behavior is normal. Judgment and thought content normal.    Results for orders placed during the hospital encounter of 02/22/11 (from the past 24 hour(s))  BASIC METABOLIC PANEL     Status: Abnormal   Collection Time   02/22/11  3:34 PM      Component Value Range   Sodium 134 (*) 135 - 145 (mEq/L)   Potassium 3.8  3.5 - 5.1 (mEq/L)   Chloride 100  96 - 112 (mEq/L)   CO2 23  19 - 32 (mEq/L)   Glucose, Bld 105 (*) 70 - 99 (mg/dL)   BUN 8  6 - 23 (mg/dL)  Creatinine, Ser 1.14 (*) 0.50 - 1.10 (mg/dL)   Calcium 9.0  8.4 - 10.5 (mg/dL)   GFR calc non Af Amer 62 (*) >90 (mL/min)   GFR calc Af Amer 72 (*) >90 (mL/min)  URINALYSIS, ROUTINE W REFLEX MICROSCOPIC     Status: Abnormal   Collection Time   02/22/11  4:05 PM      Component Value Range   Color, Urine YELLOW  YELLOW    APPearance CLOUDY (*) CLEAR    Specific Gravity, Urine 1.025  1.005 - 1.030    pH 6.0  5.0 - 8.0    Glucose, UA NEGATIVE  NEGATIVE (mg/dL)   Hgb urine dipstick NEGATIVE  NEGATIVE    Bilirubin Urine SMALL (*) NEGATIVE    Ketones, ur 40 (*) NEGATIVE (mg/dL)   Protein, ur 100 (*) NEGATIVE (mg/dL)   Urobilinogen, UA 1.0  0.0 -  1.0 (mg/dL)   Nitrite NEGATIVE  NEGATIVE    Leukocytes, UA NEGATIVE  NEGATIVE   URINE MICROSCOPIC-ADD ON     Status: Abnormal   Collection Time   02/22/11  4:05 PM      Component Value Range   Squamous Epithelial / LPF MANY (*) RARE    WBC, UA 0-2  <3 (WBC/hpf)   Bacteria, UA FEW (*) RARE    Urine-Other MUCOUS PRESENT     Prenatal labs: ABO, Rh: A/POS/-- (11/20 0845) Antibody: NEG (11/20 0845) Rubella: 5.5 (11/20 0845) RPR: NON REAC (11/20 0845)  HBsAg: NEGATIVE (11/20 0845)  HIV: NON REACTIVE (11/20 0845)  GBS:   Unknown  Dr Kennon Rounds saw pt, evaluated lab results, and agrees with POC to admit for observation.  Assessment/Plan: A: N/V of pregnancy with recent weight loss Ketones in urine during pregnancy  P: Admit to antepartum for observation IV Antiemetics Pepcid, Protonix   [place], [name] 02/22/2011, 4:36 PM

## 2011-02-22 NOTE — ED Provider Notes (Signed)
History     Chief Complaint  Patient presents with  . Emesis   HPI Pt presents with ongoing n/v, and reports vomiting >10 times today.  S/o reports witnessing pt vomit x2 when he was home.  She is curled with knees to chest on bed in MAU and reports feeling nauseous with abdominal pain despite taking antiemetics.    OB History    Grav Para Term Preterm Abortions TAB SAB Ect Mult Living   4 2 1 1 1  0 0 1 0 2      Past Medical History  Diagnosis Date  . Asthma   . Diabetes mellitus   . Mental disorder   . Depression   . Anxiety   . Hypertension     Past Surgical History  Procedure Date  . No past surgeries     Family History  Problem Relation Age of Onset  . Heart disease Mother     CHF  . Anesthesia problems Neg Hx   . Diabetes Sister     History  Substance Use Topics  . Smoking status: Never Smoker   . Smokeless tobacco: Never Used  . Alcohol Use: No     not with preg    Allergies:  Allergies  Allergen Reactions  . Dilaudid (Hydromorphone Hcl) Nausea And Vomiting  . Morphine And Related Nausea And Vomiting    Prescriptions prior to admission  Medication Sig Dispense Refill  . insulin aspart (NOVOLOG) 100 UNIT/ML injection Inject 10 Units into the skin 3 (three) times daily before meals.  10 mL  2  . insulin NPH (HUMULIN N,NOVOLIN N) 100 UNIT/ML injection Inject 5 Units into the skin 2 (two) times daily. 5 units SQ 10 am and 10 pm.      . Prenatal Vit-Fe Fumarate-FA (PRENATAL MULTIVITAMIN) TABS Take 1 tablet by mouth daily.        Marland Kitchen albuterol (PROVENTIL HFA;VENTOLIN HFA) 108 (90 BASE) MCG/ACT inhaler Inhale 2 puffs into the lungs every 6 (six) hours as needed. For shortness of breath      . escitalopram (LEXAPRO) 20 MG tablet Take 1 tablet (20 mg total) by mouth daily.  30 tablet  3  . omeprazole (PRILOSEC) 40 MG capsule Take 1 capsule (40 mg total) by mouth daily.  30 capsule  5  . promethazine (PHENERGAN) 25 MG suppository Place 25 mg rectally every 6  (six) hours as needed. For nausea        ROS  ROS: Pertinent items in HPI  OBJECTIVE Blood pressure 122/86, pulse 78, temperature 97.8 F (36.6 C), temperature source Oral, resp. rate 18, height 5\' 6"  (1.676 m), weight 78.132 kg (172 lb 4 oz), last menstrual period 09/19/2010, SpO2 96.00%. GENERAL: Well-developed, well-nourished female in no acute distress.  LUNGS: Clear to auscultation bilaterally.  HEART: Regular rate and rhythm. ABDOMEN: Soft, nontender EXTREMITIES: Nontender, no edema EXTERNAL GENITALIA: normal VAGINA: physiologic discharge CERVIX: long/closed/high  FHR: .   RESULTS Recent Results (from the past 672 hour(s))  GLUCOSE, CAPILLARY   Collection Time   02/21/11  1:20 PM      Component Value Range   Glucose-Capillary 95  70 - 99 (mg/dL)  URINALYSIS, WITH MICROSCOPIC   Collection Time   02/21/11  1:40 PM      Component Value Range   Color, Urine YELLOW  YELLOW    APPearance CLEAR  CLEAR    Specific Gravity, Urine 1.020  1.005 - 1.030    pH 6.5  5.0 - 8.0  Glucose, UA NEGATIVE  NEGATIVE (mg/dL)   Hgb urine dipstick NEGATIVE  NEGATIVE    Bilirubin Urine NEGATIVE  NEGATIVE    Ketones, ur 15 (*) NEGATIVE (mg/dL)   Protein, ur 100 (*) NEGATIVE (mg/dL)   Urobilinogen, UA 0.2  0.0 - 1.0 (mg/dL)   Nitrite NEGATIVE  NEGATIVE    Leukocytes, UA NEGATIVE  NEGATIVE    WBC, UA 0-2  <3 (WBC/hpf)   RBC / HPF 0-2  <3 (RBC/hpf)   Bacteria, UA FEW (*) RARE    Squamous Epithelial / LPF FEW (*) RARE   BASIC METABOLIC PANEL   Collection Time   02/22/11  3:34 PM      Component Value Range   Sodium 134 (*) 135 - 145 (mEq/L)   Potassium 3.8  3.5 - 5.1 (mEq/L)   Chloride 100  96 - 112 (mEq/L)   CO2 23  19 - 32 (mEq/L)   Glucose, Bld 105 (*) 70 - 99 (mg/dL)   BUN 8  6 - 23 (mg/dL)   Creatinine, Ser 1.14 (*) 0.50 - 1.10 (mg/dL)   Calcium 9.0  8.4 - 10.5 (mg/dL)   GFR calc non Af Amer 62 (*) >90 (mL/min)   GFR calc Af Amer 72 (*) >90 (mL/min)  URINE RAPID DRUG SCREEN (HOSP  PERFORMED)   Collection Time   02/22/11  4:05 PM      Component Value Range   Opiates NONE DETECTED  NONE DETECTED    Cocaine NONE DETECTED  NONE DETECTED    Benzodiazepines NONE DETECTED  NONE DETECTED    Amphetamines NONE DETECTED  NONE DETECTED    Tetrahydrocannabinol POSITIVE (*) NONE DETECTED    Barbiturates POSITIVE (*) NONE DETECTED   URINALYSIS, ROUTINE W REFLEX MICROSCOPIC   Collection Time   02/22/11  4:05 PM      Component Value Range   Color, Urine YELLOW  YELLOW    APPearance CLOUDY (*) CLEAR    Specific Gravity, Urine 1.025  1.005 - 1.030    pH 6.0  5.0 - 8.0    Glucose, UA NEGATIVE  NEGATIVE (mg/dL)   Hgb urine dipstick NEGATIVE  NEGATIVE    Bilirubin Urine SMALL (*) NEGATIVE    Ketones, ur 40 (*) NEGATIVE (mg/dL)   Protein, ur 100 (*) NEGATIVE (mg/dL)   Urobilinogen, UA 1.0  0.0 - 1.0 (mg/dL)   Nitrite NEGATIVE  NEGATIVE    Leukocytes, UA NEGATIVE  NEGATIVE   URINE MICROSCOPIC-ADD ON   Collection Time   02/22/11  4:05 PM      Component Value Range   Squamous Epithelial / LPF MANY (*) RARE    WBC, UA 0-2  <3 (WBC/hpf)   Bacteria, UA FEW (*) RARE    Urine-Other MUCOUS PRESENT    GLUCOSE, CAPILLARY   Collection Time   02/22/11  6:23 PM      Component Value Range   Glucose-Capillary 76  70 - 99 (mg/dL)   Comment 1 Documented in Chart     Comment 2 Notify RN    GLUCOSE, CAPILLARY   Collection Time   02/22/11 11:14 PM      Component Value Range   Glucose-Capillary 90  70 - 99 (mg/dL)  GLUCOSE, CAPILLARY   Collection Time   02/23/11  7:27 AM      Component Value Range   Glucose-Capillary 61 (*) 70 - 99 (mg/dL)   Comment 1 Documented in Chart     Comment 2 Notify RN    GLUCOSE, CAPILLARY  Collection Time   02/23/11  8:02 AM      Component Value Range   Glucose-Capillary 70  70 - 99 (mg/dL)  GLUCOSE, CAPILLARY   Collection Time   02/23/11 12:21 PM      Component Value Range   Glucose-Capillary 73  70 - 99 (mg/dL)  GLUCOSE, CAPILLARY   Collection Time   02/23/11   7:49 PM      Component Value Range   Glucose-Capillary 100 (*) 70 - 99 (mg/dL)  CREATININE CLEARANCE, URINE, 24 HOUR   Collection Time   02/23/11  9:30 PM      Component Value Range   Urine Total Volume-CRCL 3000     Collection Interval-CRCL 24     Creatinine, Urine 40.69     Creatinine 1.14 (*) 0.50 - 1.10 (mg/dL)   Creatinine, 24H Ur 1221  700 - 1800 (mg/day)   Creatinine Clearance 74 (*) 75 - 115 (mL/min)  GLUCOSE, CAPILLARY   Collection Time   02/23/11 10:11 PM      Component Value Range   Glucose-Capillary 121 (*) 70 - 99 (mg/dL)   Comment 1 Notify RN    GLUCOSE, CAPILLARY   Collection Time   02/24/11  5:57 AM      Component Value Range   Glucose-Capillary 94  70 - 99 (mg/dL)   Comment 1 Notify RN    GLUCOSE, CAPILLARY   Collection Time   02/24/11  9:24 PM      Component Value Range   Glucose-Capillary 102 (*) 70 - 99 (mg/dL)  GLUCOSE, CAPILLARY   Collection Time   02/25/11  6:12 AM      Component Value Range   Glucose-Capillary 71  70 - 99 (mg/dL)  GLUCOSE, CAPILLARY   Collection Time   02/25/11 10:23 AM      Component Value Range   Glucose-Capillary 102 (*) 70 - 99 (mg/dL)   IMAGING Not indicated  Physical Exam   Blood pressure 116/70, pulse 83, temperature 98.6 F (37 C), temperature source Oral, resp. rate 18, height 5\' 6"  (1.676 m), weight 179 lb (81.194 kg), last menstrual period 09/19/2010.  Physical Exam Done by Dr Kennon Rounds  MAU Course  Procedures  MDM Called Dr Kennon Rounds to discuss lab results/plan.  Dr Kennon Rounds saw and evaluated pt in MAU.  Assessment and Plan   Care assumed by Dr Kennon Rounds prior to admission to antepartum  LEFTWICH-KIRBY, Jakaiden Fill 02/22/2011, 3:52 PM

## 2011-02-23 DIAGNOSIS — O210 Mild hyperemesis gravidarum: Principal | ICD-10-CM

## 2011-02-23 LAB — GLUCOSE, CAPILLARY
Glucose-Capillary: 61 mg/dL — ABNORMAL LOW (ref 70–99)
Glucose-Capillary: 100 mg/dL — ABNORMAL HIGH (ref 70–99)
Glucose-Capillary: 121 mg/dL — ABNORMAL HIGH (ref 70–99)

## 2011-02-23 NOTE — Progress Notes (Signed)
Patient ID: [name], female   DOB: 1980-03-21, 31 y.o.   MRN: [id number] [place]) NOTE  [name] is a 31 y.o. [id number] at [redacted]w[redacted]d by LMP who is admitted for nausea and vomiting.   Fetal presentation is unsure. Length of Stay:  1  Days  Subjective: Still nausea, with headache this morning Patient reports the fetal movement as active. Patient reports uterine contraction  activity as none. Patient reports  vaginal bleeding as none. Patient describes fluid per vagina as None.  Vitals:  Blood pressure 108/77, pulse 115, temperature 98.3 F (36.8 C), temperature source Oral, resp. rate 18, height 5\' 6"  (1.676 m), weight 80.485 kg (177 lb 7 oz), last menstrual period 09/19/2010, SpO2 100.00%. Physical Examination:  General appearance - alert, well appearing, and in no distress and mildly uncomfortable Heart - normal rate and regular rhythm Abdomen - soft, nontender, nondistended Fundal Height:  size equals dates Cervical Exam: Not evaluated. and found to be not evaluated/ / and fetal presentation is unsure. Extremities: extremities normal, atraumatic, no cyanosis or edema and Homans sign is negative, no sign of DVT with DTRs 2+ bilaterally Membranes:intact  Fetal Monitoring:  not monitored  Labs:  Recent Results (from the past 24 hour(s))  BASIC METABOLIC PANEL   Collection Time   02/22/11  3:34 PM      Component Value Range   Sodium 134 (*) 135 - 145 (mEq/L)   Potassium 3.8  3.5 - 5.1 (mEq/L)   Chloride 100  96 - 112 (mEq/L)   CO2 23  19 - 32 (mEq/L)   Glucose, Bld 105 (*) 70 - 99 (mg/dL)   BUN 8  6 - 23 (mg/dL)   Creatinine, Ser 1.14 (*) 0.50 - 1.10 (mg/dL)   Calcium 9.0  8.4 - 10.5 (mg/dL)   GFR calc non Af Amer 62 (*) >90 (mL/min)   GFR calc Af Amer 72 (*) >90 (mL/min)  URINE RAPID DRUG SCREEN (HOSP PERFORMED)   Collection Time   02/22/11  4:05 PM      Component Value Range   Opiates NONE DETECTED  NONE DETECTED    Cocaine NONE  DETECTED  NONE DETECTED    Benzodiazepines NONE DETECTED  NONE DETECTED    Amphetamines NONE DETECTED  NONE DETECTED    Tetrahydrocannabinol POSITIVE (*) NONE DETECTED    Barbiturates POSITIVE (*) NONE DETECTED   URINALYSIS, ROUTINE W REFLEX MICROSCOPIC   Collection Time   02/22/11  4:05 PM      Component Value Range   Color, Urine YELLOW  YELLOW    APPearance CLOUDY (*) CLEAR    Specific Gravity, Urine 1.025  1.005 - 1.030    pH 6.0  5.0 - 8.0    Glucose, UA NEGATIVE  NEGATIVE (mg/dL)   Hgb urine dipstick NEGATIVE  NEGATIVE    Bilirubin Urine SMALL (*) NEGATIVE    Ketones, ur 40 (*) NEGATIVE (mg/dL)   Protein, ur 100 (*) NEGATIVE (mg/dL)   Urobilinogen, UA 1.0  0.0 - 1.0 (mg/dL)   Nitrite NEGATIVE  NEGATIVE    Leukocytes, UA NEGATIVE  NEGATIVE   URINE MICROSCOPIC-ADD ON   Collection Time   02/22/11  4:05 PM      Component Value Range   Squamous Epithelial / LPF MANY (*) RARE    WBC, UA 0-2  <3 (WBC/hpf)   Bacteria, UA FEW (*) RARE    Urine-Other MUCOUS PRESENT    GLUCOSE, CAPILLARY   Collection Time   02/22/11  6:23 PM      Component Value Range   Glucose-Capillary 76  70 - 99 (mg/dL)   Comment 1 Documented in Chart     Comment 2 Notify RN    GLUCOSE, CAPILLARY   Collection Time   02/22/11 11:14 PM      Component Value Range   Glucose-Capillary 90  70 - 99 (mg/dL)    Medications:  Scheduled    . escitalopram  20 mg Oral Daily  . famotidine  20 mg Intravenous Once  . insulin NPH  5 Units Subcutaneous BH-qamhs  . pantoprazole  80 mg Oral Q1200  . prenatal multivitamin  1 tablet Oral Daily  . sodium chloride       I have reviewed the patient's current medications.  ASSESSMENT: Patient Active Problem List  Diagnoses  . Type 2 diabetes mellitus  . Hyperemesis gravidarum before end of [redacted] week gestation, dehydration  . Chronic hypertension in pregnancy  . Depression  . Asthma  . H. pylori infection  . Supervision of high-risk pregnancy    PLAN: Antiemetic,  fluids, observe  Lauris Keepers 02/23/2011,6:46 AM

## 2011-02-23 NOTE — Progress Notes (Signed)
INITIAL ADULT NUTRITION ASSESSMENT Date: 02/23/2011   Time: 1:39 PM Reason for Assessment: Hyperemesis at 22 3/7 weeks  ASSESSMENT: Female 31 y.o.  Dx  Patient Active Problem List  Diagnoses  . Type 2 diabetes mellitus  . Hyperemesis gravidarum before end of [redacted] week gestation, dehydration  . Chronic hypertension in pregnancy  . Depression  . Asthma  . H. pylori infection  . Supervision of high-risk pregnancy   Hx: Past Medical History  Diagnosis Date  . Asthma   . Diabetes mellitus   . Mental disorder   . Depression   . Anxiety   . Hypertension    Related Meds:     . escitalopram  20 mg Oral Daily  . famotidine  20 mg Intravenous Once  . insulin NPH  5 Units Subcutaneous BH-qamhs  . pantoprazole  80 mg Oral Q1200  . prenatal multivitamin  1 tablet Oral Daily  . sodium chloride        Ht: 5\' 6"  (167.6 cm)  Wt: 177 lb 7 oz (80.485 kg)  Ideal Wt: 59.3 kg 63.8 kg % Ideal Wt: 136%  Usual Wt: 181 Lbs ( 82.3 kg) % Usual Wt: 98%  Body mass index is 28.64 kg/(m^2).  Food/Nutrition Related Hx: 12th admission to [facility]U or [facility] during this pregnancy for hyperemesis. Hyperemesis started at 6 weeks IUP Weight is up 6 Lbs from weight at last discharge. Reports to me, very minimal PO intake since last Thursday. Continues to have nausea today. Even though Pt does not meet the weight loss criteria for malnutrition, she likely is malnourished given the length of time the hyperemesis has persisted( 16 weeks) Labs:  CBG (last 3)   Basename 02/23/11 1221 02/23/11 0802 02/23/11 0727  GLUCAP 73 70 61*   CMP     Component Value Date/Time   NA 134* 02/22/2011 1534   K 3.8 02/22/2011 1534   CL 100 02/22/2011 1534   CO2 23 02/22/2011 1534   GLUCOSE 105* 02/22/2011 1534   BUN 8 02/22/2011 1534   CREATININE 1.14* 02/22/2011 1534   CALCIUM 9.0 02/22/2011 1534   PROT 6.8 01/28/2011 0205   ALBUMIN 2.8* 01/28/2011 0205   AST 8 01/28/2011 0205   ALT 5 01/28/2011 0205   ALKPHOS 56 01/28/2011 0205   BILITOT 0.2* 01/28/2011 0205   GFRNONAA 62* 02/22/2011 1534   GFRAA 72* 02/22/2011 1534     Intake:   Intake/Output Summary (Last 24 hours) at 02/23/11 1345 Last data filed at 02/23/11 1237  Gross per 24 hour  Intake 2393.82 ml  Output   1000 ml  Net 1393.82 ml     Diet Order: Gestational Diabetic  Supplements/Tube Feeding:  IVF:    sodium chloride 0.45 % with kcl Last Rate: 125 mL/hr at 02/23/11 [id number]  DISCONTD: 0.45 % NaCl with KCl 20 mEq / L Last Rate: 125 mL/hr at 02/22/11 1745    Estimated Nutritional Needs:   Kcal: 22-2400 Protein: 90-100 g Fluid: 2.2 liters  NUTRITION DIAGNOSIS: -Inadequate oral intake (NI-2.1).  R/t hyperemesis aeb pt. report and 2 % loss of usual weight  Status: Ongoing  MONITORING/EVALUATION(Goals): PO intake that will meet estimated needs and promote weight gain  EDUCATION NEEDS: -No education needs identified at this time  INTERVENTION: Encouraged PO intake. Pt given Gestational Diabetic snack menus and encouraged to order meals and 3 snacks in between meals. Bland foods suggested. Pt would like to try tolerating food today and if still unabale to tolerate PO intake tomorrow  she may request tube feedings.This would put pt with inadequate PO intake for 7 days and in need of repletion tube feedings would provide If tube feedings desired: Place #8 feeding tube ng Order Jevity 1.2 at 20 ml/hr CNG Advance by 10 ml q 6 hours to a goal rate of 80 ml/hr ( 2304 Kcal, 106 g protein, 1555 ml free water)  Dietitian [id number]  DOCUMENTATION CODES Per approved criteria  -Non-severe (moderate) malnutrition in the context of chronic illness    Edelyn Heidel,KATHY 02/23/2011, 1:39 PM

## 2011-02-23 NOTE — H&P (Signed)
Chart reviewed and agree with management and plan.  

## 2011-02-23 NOTE — Progress Notes (Signed)
UR Chart review completed.  

## 2011-02-23 NOTE — Plan of Care (Signed)
Problem: Consults Goal: Nutrition Consult-if indicated Outcome: Not Progressing Pt at 98% of usual weight, and no net weight gain at 22 3/7 weeks IUP. Very marginal PO intake over the past 6 days. May require tube feedings

## 2011-02-24 LAB — GLUCOSE, CAPILLARY
Glucose-Capillary: 94 mg/dL (ref 70–99)
Glucose-Capillary: 102 mg/dL — ABNORMAL HIGH (ref 70–99)

## 2011-02-24 LAB — CREATININE CLEARANCE, URINE, 24 HOUR
Creatinine Clearance: 74 mL/min — ABNORMAL LOW (ref 75–115)
Creatinine, 24H Ur: 1221 mg/d (ref 700–1800)

## 2011-02-24 NOTE — Progress Notes (Signed)
So far today the patient has eaten and kept down yogurt, a baked potato, an apple, and graham crackers. She has tolerated fluids po as well.

## 2011-02-24 NOTE — Progress Notes (Addendum)
Pt complaining of IV site burning... Spoke with CNM regarding pt being a difficult stick after numerous attempts to gain IV access were reported... CNM stated to stop IV fluids, and flush site.. Stated she would review labs on pt and fluid orders.

## 2011-02-24 NOTE — Progress Notes (Signed)
Subjective: Patient reports nausea, vomiting and no problems voiding.  Is eating some foods, but still nauseated.  UDS showed THC - patient admits to smoking every 2-3 days.    Objective: I have reviewed patient's vital signs, intake and output, medications and labs.  General: alert, cooperative and no distress Resp: clear to auscultation bilaterally Cardio: regular rate and rhythm, S1, S2 normal, no murmur, click, rub or gallop GI: soft, non-tender; bowel sounds normal; no masses,  no organomegaly Extremities: extremities normal, atraumatic, no cyanosis or edema   Assessment/Plan: 1.  Irretractable nausea and vomiting 2.  Pregnancy 3.  DM2 4.  Asthma  Continue with urine collection for porphyria.   Advised that should stop smoking marijuana as may contribute to cycle vomiting syndrome.   Continue with insulin   LOS: 2 days    Shad Ledvina JEHIEL 02/24/2011, 9:23 AM

## 2011-02-24 NOTE — Progress Notes (Signed)
Thank you to Dr. Ronne Binning for this referral. Chart review complete.  [name] is not appropriate for [facility] services at this time due to her insurance being medicaid primary.  She must have a medicare primary payor or one of our participating HMOs.  For any questions or concerns please contact me at [phone].

## 2011-02-25 LAB — GLUCOSE, CAPILLARY
Glucose-Capillary: 71 mg/dL (ref 70–99)
Glucose-Capillary: 102 mg/dL — ABNORMAL HIGH (ref 70–99)

## 2011-02-25 MED ORDER — ONDANSETRON HCL 4 MG PO TABS: 4.0000 mg | ORAL_TABLET | Freq: Two times a day (BID) | ORAL | Status: AC | PRN

## 2011-02-25 MED ORDER — SODIUM CHLORIDE 0.9 % IV SOLN
INTRAVENOUS | Status: DC
  Administered 2011-02-25: 02:00:00 via INTRAVENOUS

## 2011-02-25 MED ORDER — ONDANSETRON HCL 4 MG PO TABS
8.0000 mg | ORAL_TABLET | Freq: Once | ORAL | Status: AC
  Administered 2011-02-25: 8 mg via ORAL
  Filled 2011-02-25 (×2): qty 1

## 2011-02-25 NOTE — Discharge Summary (Signed)
Attestation of Attending Supervision of Resident: Evaluation and management procedures were performed by the [facility] Resident under my supervision.  I have reviewed the resident's note, chart reviewed and agree with management and plan.  Verita Schneiders, M.D. 02/25/2011 2:08 PM

## 2011-02-25 NOTE — Progress Notes (Signed)
Subjective: Patient reports nausea, and  vomiting with no witness of how much she is vomiting.  Is eating, but reports still nauseated.  UDS showed THC - patient admits to smoking every 2-3 days.  Yesterday security had to come since she was having an argument with boyfriend.  Objective: I have reviewed patient's vital signs, intake and output, medications and labs. Negative for porphobilinogen.  General: alert, cooperative and no distress Resp: clear to auscultation bilaterally Cardio: regular rate and rhythm, S1, S2 normal, no murmur, click, rub or gallop GI: soft, non-tender; bowel sounds normal; no masses,  no organomegaly Extremities: extremities normal, atraumatic, no cyanosis or edema  Assessment/Plan: 1.  Irretractable nausea and vomiting 2.  Pregnancy 3.  DM2 4.  Asthma  Advised that should stop smoking marijuana as may contribute to cycle vomiting syndrome.   Continue with insulin Discharge home.   LOS: 4 days   D. [place]. MD PGY-1  02/25/2011, 9:22 AM

## 2011-02-25 NOTE — Discharge Summary (Signed)
  Physician Discharge Summary  Patient ID: [name] MRN: [id number] DOB/AGE: 02/15/1980 31 y.o.  Admit date: 2/4 /2013 Discharge date: 02/25/2011  Admission Diagnoses: hyperemesis gravidarium, [redacted] weeks EGA, depression.  Discharge Diagnoses: same. Cyclic vomiting.  Discharged Condition: fair  Hospital Course: She was admitted for her long-standing hyperemesis gravidum . She was positive with THC and admitted smoking Marihuana. Past hx was reviewed and was found to have cyclic vomiting and complete GI work-up at [facility] prior pregancy. No witness of vomiting episodes on this admission. Had security come to her room due to disturbances to the floor after having arguments with her partner. Partner was d/c from hospital and it is not allowed to come inside this institution per security recommendation .   Consults: none  Significant Diagnostic Studies: urinary porphobilinogen negative. Pt negative for porphyrias as a cause of cyclic vomiting. 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  Treatments: IV hydration, phenergan I/V, Zofran  Discharge Exam: Filed Vitals:   02/25/11 0547  BP: 122/86  Pulse: 78  Temp: 97.8 F (36.6 C)  Resp: 18   General appearance: alert Cardio: regular rate and rhythm, S1, S2 normal, no murmur, click, rub or gallop GI: soft, non-tender; bowel sounds normal; no masses,  no organomegaly  Disposition: Home or Self Care Scheduled Meds:   . escitalopram  20 mg Oral Daily  . insulin NPH  5 Units Subcutaneous BH-qamhs  . ondansetron  8 mg Oral Once  . pantoprazole  80 mg Oral Q1200  . prenatal multivitamin  1 tablet Oral Daily   Continuous Infusions:   . sodium chloride 125 mL/hr at 02/25/11 0211  . DISCONTD: sodium chloride 0.45 % with kcl 125 mL/hr at 02/24/11 1939   PRN Meds:.albuterol, alum & mag hydroxide-simeth, ondansetron (ZOFRAN) IV, promethazine, zolpidem            Follow-up Information    Please follow up your  scheduled appointments         D. [place]. MD PGY-1 11:36 AM  02/24/2010

## 2011-02-26 ENCOUNTER — Ambulatory Visit (HOSPITAL_COMMUNITY)
Admit: 2011-02-26 | Discharge: 2011-02-26 | Disposition: A | Discharge status: Home / Self Care | Payer: Medicaid Other | Attending: Family Medicine | Admitting: Family Medicine

## 2011-02-26 DIAGNOSIS — O10919 Unspecified pre-existing hypertension complicating pregnancy, unspecified trimester: Secondary | ICD-10-CM

## 2011-02-26 DIAGNOSIS — E119 Type 2 diabetes mellitus without complications: Secondary | ICD-10-CM

## 2011-02-26 DIAGNOSIS — Z0489 Encounter for examination and observation for other specified reasons: Secondary | ICD-10-CM

## 2011-02-26 NOTE — Progress Notes (Signed)
Encounter addended by: Cassell Clement, RN on: 02/26/2011  5:01 PM<BR>     Documentation filed: Episodes

## 2011-02-26 NOTE — Progress Notes (Signed)
[name] was seen for ultrasound appointment today.  Please see [facility] report for details.

## 2011-03-10 NOTE — ED Provider Notes (Signed)
Chart reviewed and agree with management and plan.  

## 2011-03-11 ENCOUNTER — Inpatient Hospital Stay (HOSPITAL_COMMUNITY)
Admission: AD | Admit: 2011-03-11 | Discharge: 2011-03-13 | DRG: 781 | Disposition: A | Discharge status: Home / Self Care | Payer: Medicaid Other | Source: Ambulatory Visit | Attending: Obstetrics & Gynecology | Admitting: Obstetrics & Gynecology

## 2011-03-11 ENCOUNTER — Encounter (HOSPITAL_COMMUNITY): Payer: Self-pay | Admitting: *Deleted

## 2011-03-11 DIAGNOSIS — O212 Late vomiting of pregnancy: Secondary | ICD-10-CM

## 2011-03-11 DIAGNOSIS — E119 Type 2 diabetes mellitus without complications: Secondary | ICD-10-CM | POA: Diagnosis present

## 2011-03-11 DIAGNOSIS — O210 Mild hyperemesis gravidarum: Secondary | ICD-10-CM

## 2011-03-11 DIAGNOSIS — O24919 Unspecified diabetes mellitus in pregnancy, unspecified trimester: Secondary | ICD-10-CM

## 2011-03-11 DIAGNOSIS — O211 Hyperemesis gravidarum with metabolic disturbance: Secondary | ICD-10-CM

## 2011-03-11 DIAGNOSIS — O219 Vomiting of pregnancy, unspecified: Secondary | ICD-10-CM

## 2011-03-11 DIAGNOSIS — O10019 Pre-existing essential hypertension complicating pregnancy, unspecified trimester: Secondary | ICD-10-CM | POA: Diagnosis present

## 2011-03-11 DIAGNOSIS — O10919 Unspecified pre-existing hypertension complicating pregnancy, unspecified trimester: Secondary | ICD-10-CM

## 2011-03-11 DIAGNOSIS — E860 Dehydration: Secondary | ICD-10-CM | POA: Diagnosis present

## 2011-03-11 LAB — URINALYSIS, ROUTINE W REFLEX MICROSCOPIC
Ketones, ur: 40 mg/dL — AB
Leukocytes, UA: NEGATIVE
Nitrite: NEGATIVE
Protein, ur: 100 mg/dL — AB
Urobilinogen, UA: 0.2 mg/dL (ref 0.0–1.0)
pH: 6 (ref 5.0–8.0)

## 2011-03-11 LAB — BASIC METABOLIC PANEL
CO2: 21 mEq/L (ref 19–32)
Calcium: 9.1 mg/dL (ref 8.4–10.5)
Chloride: 101 mEq/L (ref 96–112)
GFR calc Af Amer: 72 mL/min — ABNORMAL LOW (ref >90)
Sodium: 134 mEq/L — ABNORMAL LOW (ref 135–145)

## 2011-03-11 LAB — GLUCOSE, CAPILLARY: Glucose-Capillary: 96 mg/dL (ref 70–99)

## 2011-03-11 LAB — URINE MICROSCOPIC-ADD ON

## 2011-03-11 MED ORDER — ONDANSETRON HCL 4 MG/2ML IJ SOLN
4.0000 mg | Freq: Three times a day (TID) | INTRAMUSCULAR | Status: DC | PRN
  Administered 2011-03-11 – 2011-03-13 (×4): 4 mg via INTRAVENOUS
  Filled 2011-03-11 (×4): qty 2

## 2011-03-11 MED ORDER — ESCITALOPRAM OXALATE 20 MG PO TABS: 20.0000 mg | ORAL_TABLET | Freq: Every day | ORAL | Status: DC

## 2011-03-11 MED ORDER — ZOLPIDEM TARTRATE 10 MG PO TABS
10.0000 mg | ORAL_TABLET | Freq: Every evening | ORAL | Status: DC | PRN
  Administered 2011-03-11 – 2011-03-13 (×2): 10 mg via ORAL
  Filled 2011-03-11 (×2): qty 1

## 2011-03-11 MED ORDER — INSULIN NPH (HUMAN) (ISOPHANE) 100 UNIT/ML ~~LOC~~ SUSP
5.0000 [IU] | Freq: Two times a day (BID) | SUBCUTANEOUS | Status: DC
  Administered 2011-03-12 – 2011-03-13 (×3): 5 [IU] via SUBCUTANEOUS
  Filled 2011-03-11: qty 10

## 2011-03-11 MED ORDER — PROMETHAZINE HCL 25 MG/ML IJ SOLN
25.0000 mg | INTRAVENOUS | Status: DC
  Administered 2011-03-11 (×2): 25 mg via INTRAVENOUS
  Filled 2011-03-11 (×2): qty 1

## 2011-03-11 MED ORDER — ESCITALOPRAM OXALATE 20 MG PO TABS
20.0000 mg | ORAL_TABLET | Freq: Every day | ORAL | Status: DC
  Filled 2011-03-11 (×4): qty 1

## 2011-03-11 MED ORDER — CALCIUM CARBONATE ANTACID 500 MG PO CHEW
2.0000 | CHEWABLE_TABLET | ORAL | Status: DC | PRN
  Administered 2011-03-11: 400 mg via ORAL
  Filled 2011-03-11: qty 2

## 2011-03-11 MED ORDER — LACTATED RINGERS IV SOLN
INTRAVENOUS | Status: DC
  Administered 2011-03-11 – 2011-03-13 (×4): via INTRAVENOUS

## 2011-03-11 MED ORDER — PROMETHAZINE HCL 25 MG/ML IJ SOLN
25.0000 mg | INTRAMUSCULAR | Status: DC | PRN
  Administered 2011-03-12 – 2011-03-13 (×6): 25 mg via INTRAVENOUS
  Filled 2011-03-11 (×7): qty 1

## 2011-03-11 MED ORDER — PANTOPRAZOLE SODIUM 40 MG PO TBEC: 40.0000 mg | DELAYED_RELEASE_TABLET | Freq: Every day | ORAL | Status: DC

## 2011-03-11 MED ORDER — ACETAMINOPHEN 325 MG PO TABS: 650.0000 mg | ORAL_TABLET | ORAL | Status: DC | PRN

## 2011-03-11 MED ORDER — PRENATAL MULTIVITAMIN CH
1.0000 | ORAL_TABLET | Freq: Every day | ORAL | Status: DC
  Filled 2011-03-11: qty 1

## 2011-03-11 MED ORDER — DOCUSATE SODIUM 100 MG PO CAPS
100.0000 mg | ORAL_CAPSULE | Freq: Every day | ORAL | Status: DC
  Filled 2011-03-11: qty 1

## 2011-03-11 MED ORDER — PANTOPRAZOLE SODIUM 40 MG IV SOLR
40.0000 mg | Freq: Every day | INTRAVENOUS | Status: DC
  Administered 2011-03-11 – 2011-03-12 (×2): 40 mg via INTRAVENOUS
  Filled 2011-03-11 (×3): qty 40

## 2011-03-11 MED ORDER — MAGNESIUM HYDROXIDE 400 MG/5ML PO SUSP
30.0000 mL | Freq: Once | ORAL | Status: AC
  Administered 2011-03-11: 30 mL via ORAL
  Filled 2011-03-11: qty 30

## 2011-03-11 NOTE — H&P (Signed)
[name] is a 31 y.o. female presenting for vomiting . Maternal Medical History:  Reason for admission: Reason for admission: nausea.  31 year old G4 P1 112 at 24 weeks 5 days presented via EMS with two-day history nausea and vomiting without retaining any food or fluids. She's vomited several times over the last 24 hours. Home Phenergan has been ineffective. The vomiting is associated with sharp left-sided pain that begins waist level midaxillary line and radiates to the left lower quadrant; exacerbated by movement. She states she's not taking her insulin due to not eating and last dose was on 03/09/2011. She checks  blood sugar when she " feels bad" and today CBG was 88, yesterday 99.  Missed last appointment at [facility]. Wt 82.5 kg when seen here 02/26/2011, now 78.1kg.    OB History    Grav Para Term Preterm Abortions TAB SAB Ect Mult Living   4 2 1 1 1  0 0 1 0 2     Past Medical History  Diagnosis Date  . Asthma   . Diabetes mellitus   . Mental disorder   . Depression   . Anxiety   . Hypertension    Past Surgical History  Procedure Date  . No past surgeries    Family History: family history includes Diabetes in her sister and Heart disease in her mother.  There is no history of Anesthesia problems. Social History:  reports that she has never smoked. She has never used smokeless tobacco. She reports that she uses illicit drugs (Cocaine and Marijuana). She reports that she does not drink alcohol.  Review of Systems  Constitutional: Positive for weight loss and malaise/fatigue. Negative for fever and chills.  Respiratory: Negative for cough and shortness of breath.   Gastrointestinal: Positive for heartburn, nausea, vomiting and abdominal pain. Negative for diarrhea, constipation and blood in stool.       Had normal BM this am  Genitourinary: Negative for dysuria, urgency, frequency, hematuria and flank pain.  Musculoskeletal: Positive for back pain.  Skin: Negative for rash.    Neurological: Positive for weakness. Negative for dizziness, focal weakness and headaches.  Psychiatric/Behavioral: Positive for depression. Negative for suicidal ideas and substance abuse.      Blood pressure 121/94, pulse 18, resp. rate 18, height 5\' 6"  (1.676 m), weight 78.189 kg (172 lb 6 oz), last menstrual period 09/19/2010, SpO2 100.00%. Maternal Exam:  Uterine Assessment: Not contracting  Abdomen: Patient reports the following abdominal tenderness: suprapubic and LLQ.  Fundal height is u +76fb.   Tenderness is mild, no guarding or rebound  Cervix: not evaluated.   Physical Exam  Constitutional: She is oriented to person, place, and time.       Appears uncomfortable  Neck: Normal range of motion. Neck supple.  Cardiovascular: Normal rate and regular rhythm.   Respiratory: Breath sounds normal.  GI: Soft. Bowel sounds are normal. She exhibits no distension. There is tenderness in the suprapubic area and left lower quadrant. There is no rebound and no guarding.  Neurological: She is alert and oriented to person, place, and time. She has normal reflexes.  Skin: Skin is warm and dry.  Psychiatric: She has a normal mood and affect.    Prenatal labs: ABO, Rh: A/POS/-- (11/20 0845) Antibody: NEG (11/20 0845) Rubella: 5.5 (11/20 0845) RPR: NON REAC (11/20 0845)  HBsAg: NEGATIVE (11/20 0845)  HIV: NON REACTIVE (11/20 0845)  GBS:     Results for orders placed during the hospital encounter of 03/11/11 (from  the past 24 hour(s))  URINALYSIS, ROUTINE W REFLEX MICROSCOPIC     Status: Abnormal   Collection Time   03/11/11 10:22 AM      Component Value Range   Color, Urine YELLOW  YELLOW    APPearance CLEAR  CLEAR    Specific Gravity, Urine >1.030 (*) 1.005 - 1.030    pH 6.0  5.0 - 8.0    Glucose, UA NEGATIVE  NEGATIVE (mg/dL)   Hgb urine dipstick NEGATIVE  NEGATIVE    Bilirubin Urine MODERATE (*) NEGATIVE    Ketones, ur 40 (*) NEGATIVE (mg/dL)   Protein, ur 100 (*) NEGATIVE  (mg/dL)   Urobilinogen, UA 0.2  0.0 - 1.0 (mg/dL)   Nitrite NEGATIVE  NEGATIVE    Leukocytes, UA NEGATIVE  NEGATIVE   URINE MICROSCOPIC-ADD ON     Status: Abnormal   Collection Time   03/11/11 10:22 AM      Component Value Range   Squamous Epithelial / LPF MANY (*) RARE    WBC, UA 0-2  <3 (WBC/hpf)   Bacteria, UA FEW (*) RARE    Urine-Other MUCOUS PRESENT     FHR 150-155, moderate variabililty, without decels noted on discontinuous tracing Toco: No UCs  MAU Course: No symptomatic improvement after IV LR 1000 with Phenergan 25 mg   Assessment:: [id number] at [redacted]w[redacted]d Cyclic vomiting syndrome; dehydration, weight loss, hematemesis Class C DM CHTN Marijuana use  Plan Place in 23 hr obs while continuing IV rehydration and antiemetics Check BMP  Sujey Gundry 03/11/2011, 1:43 PM

## 2011-03-11 NOTE — ED Provider Notes (Signed)
31 yo [id number] at [redacted]w[redacted]d well known to our Service with multiple admissions for  intractable vomiting presents via EMS for N/V and left side pain.  Results for orders placed during the hospital encounter of 03/11/11 (from the past 24 hour(s))  URINALYSIS, ROUTINE W REFLEX MICROSCOPIC     Status: Abnormal   Collection Time   03/11/11 10:22 AM      Component Value Range   Color, Urine YELLOW  YELLOW    APPearance CLEAR  CLEAR    Specific Gravity, Urine >1.030 (*) 1.005 - 1.030    pH 6.0  5.0 - 8.0    Glucose, UA NEGATIVE  NEGATIVE (mg/dL)   Hgb urine dipstick NEGATIVE  NEGATIVE    Bilirubin Urine MODERATE (*) NEGATIVE    Ketones, ur 40 (*) NEGATIVE (mg/dL)   Protein, ur 100 (*) NEGATIVE (mg/dL)   Urobilinogen, UA 0.2  0.0 - 1.0 (mg/dL)   Nitrite NEGATIVE  NEGATIVE    Leukocytes, UA NEGATIVE  NEGATIVE   URINE MICROSCOPIC-ADD ON     Status: Abnormal   Collection Time   03/11/11 10:22 AM      Component Value Range   Squamous Epithelial / LPF MANY (*) RARE    WBC, UA 0-2  <3 (WBC/hpf)   Bacteria, UA FEW (*) RARE    Urine-Other MUCOUS PRESENT     MAU Course: Vomiting or spitting what appears to be bloody mucus. Still nauseated after IVF with Phenergan  See H&P for 23 hr observation/rehydration.

## 2011-03-11 NOTE — Progress Notes (Signed)
Pt states n/v, l flank pain x3 days, denies diarrhea, or cough/congestion. Denies bleeding or vaginal d/c changes. +FM in triage.

## 2011-03-11 NOTE — Progress Notes (Signed)
Per 15 min strip. Patient refused 15 mins more monitoring. CNM made aware.

## 2011-03-12 LAB — GLUCOSE, CAPILLARY
Glucose-Capillary: 105 mg/dL — ABNORMAL HIGH (ref 70–99)
Glucose-Capillary: 91 mg/dL (ref 70–99)
Glucose-Capillary: 77 mg/dL (ref 70–99)
Glucose-Capillary: 63 mg/dL — ABNORMAL LOW (ref 70–99)

## 2011-03-12 LAB — URINALYSIS, ROUTINE W REFLEX MICROSCOPIC
Bilirubin Urine: NEGATIVE
Leukocytes, UA: NEGATIVE
Nitrite: NEGATIVE
Specific Gravity, Urine: 1.025 (ref 1.005–1.030)
pH: 6 (ref 5.0–8.0)

## 2011-03-12 MED ORDER — BENZOCAINE 20 % MT GEL
Freq: Four times a day (QID) | OROMUCOSAL | Status: DC | PRN
  Administered 2011-03-13 (×2): via OROMUCOSAL
  Filled 2011-03-12: qty 9

## 2011-03-12 MED ORDER — BENZOCAINE 10 % MT GEL: Freq: Four times a day (QID) | OROMUCOSAL | Status: DC | PRN

## 2011-03-12 MED ORDER — FAMOTIDINE 20 MG PO TABS
20.0000 mg | ORAL_TABLET | Freq: Two times a day (BID) | ORAL | Status: DC
  Administered 2011-03-12: 20 mg via ORAL
  Filled 2011-03-12 (×4): qty 1

## 2011-03-12 MED ORDER — METOCLOPRAMIDE HCL 10 MG PO TABS
10.0000 mg | ORAL_TABLET | Freq: Four times a day (QID) | ORAL | Status: DC | PRN
  Administered 2011-03-12: 10 mg via ORAL
  Filled 2011-03-12: qty 1

## 2011-03-12 MED ORDER — ALUM & MAG HYDROXIDE-SIMETH 200-200-20 MG/5ML PO SUSP
30.0000 mL | ORAL | Status: DC | PRN
  Administered 2011-03-12 – 2011-03-13 (×2): 30 mL via ORAL
  Filled 2011-03-12 (×3): qty 30

## 2011-03-12 NOTE — H&P (Signed)
Agree with admission plan

## 2011-03-12 NOTE — Progress Notes (Signed)
UR Chart review completed.  

## 2011-03-12 NOTE — Progress Notes (Signed)
FACULTY PRACTICE ANTEPARTUM NOTE  [name] is a 31 y.o. [id number] at [redacted]w[redacted]d  who is admitted for hyperemesis and weight loss.   Length of Stay:  1  Days  Subjective: Still nauseated and vomiting.  Has eaten little. Patient reports good fetal movement.  She reports no uterine contractions, no bleeding and no loss of fluid per vagina.  Vitals:  Blood pressure 121/59, pulse 86, temperature 98.7 F (37.1 C), temperature source Oral, resp. rate 20, height 5\' 6"  (1.676 m), weight 77.565 kg (171 lb), last menstrual period 09/19/2010, SpO2 100.00%. Physical Examination:  General appearance - alert, well appearing, and in no distress Chest - clear to auscultation, no wheezes, rales or rhonchi, symmetric air entry Heart - normal rate, regular rhythm, normal S1, S2, no murmurs, rubs, clicks or gallops Abdomen - soft, nontender, nondistended, no masses or organomegaly Extremities: extremities normal, atraumatic, no cyanosis or edema  Fetal Monitoring:  Baseline: 150s bpm  Labs:  Recent Results (from the past 24 hour(s))  BASIC METABOLIC PANEL   Collection Time   03/11/11  7:05 PM      Component Value Range   Sodium 134 (*) 135 - 145 (mEq/L)   Potassium 3.5  3.5 - 5.1 (mEq/L)   Chloride 101  96 - 112 (mEq/L)   CO2 21  19 - 32 (mEq/L)   Glucose, Bld 97  70 - 99 (mg/dL)   BUN 10  6 - 23 (mg/dL)   Creatinine, Ser 1.14 (*) 0.50 - 1.10 (mg/dL)   Calcium 9.1  8.4 - 10.5 (mg/dL)   GFR calc non Af Amer 62 (*) >90 (mL/min)   GFR calc Af Amer 72 (*) >90 (mL/min)  GLUCOSE, CAPILLARY   Collection Time   03/11/11 11:25 PM      Component Value Range   Glucose-Capillary 116 (*) 70 - 99 (mg/dL)  GLUCOSE, CAPILLARY   Collection Time   03/12/11  3:02 AM      Component Value Range   Glucose-Capillary 105 (*) 70 - 99 (mg/dL)  GLUCOSE, CAPILLARY   Collection Time   03/12/11  6:53 AM      Component Value Range   Glucose-Capillary 91  70 - 99 (mg/dL)  GLUCOSE, CAPILLARY   Collection Time   03/12/11  10:10 AM      Component Value Range   Glucose-Capillary 77  70 - 99 (mg/dL)   Comment 1 Notify RN     Comment 2 Documented in Chart    URINALYSIS, ROUTINE W REFLEX MICROSCOPIC   Collection Time   03/12/11 10:15 AM      Component Value Range   Color, Urine YELLOW  YELLOW    APPearance CLEAR  CLEAR    Specific Gravity, Urine 1.025  1.005 - 1.030    pH 6.0  5.0 - 8.0    Glucose, UA NEGATIVE  NEGATIVE (mg/dL)   Hgb urine dipstick NEGATIVE  NEGATIVE    Bilirubin Urine NEGATIVE  NEGATIVE    Ketones, ur >80 (*) NEGATIVE (mg/dL)   Protein, ur NEGATIVE  NEGATIVE (mg/dL)   Urobilinogen, UA 0.2  0.0 - 1.0 (mg/dL)   Nitrite NEGATIVE  NEGATIVE    Leukocytes, UA NEGATIVE  NEGATIVE   GLUCOSE, CAPILLARY   Collection Time   03/12/11  2:05 PM      Component Value Range   Glucose-Capillary 63 (*) 70 - 99 (mg/dL)   Comment 1 Notify RN     Comment 2 Documented in Chart    GLUCOSE, CAPILLARY  Collection Time   03/12/11  6:57 PM      Component Value Range   Glucose-Capillary 82  70 - 99 (mg/dL)   Medications:  Scheduled    . docusate sodium  100 mg Oral Daily  . escitalopram  20 mg Oral Daily  . famotidine  20 mg Oral BID  . insulin NPH  5 Units Subcutaneous BID  . pantoprazole (PROTONIX) IV  40 mg Intravenous QHS  . prenatal multivitamin  1 tablet Oral Daily   I have reviewed the patient's current medications.  ASSESSMENT: Patient Active Problem List  Diagnoses  . Type 2 diabetes mellitus  . Hyperemesis gravidarum before end of [redacted] week gestation, dehydration  . Chronic hypertension in pregnancy  . Depression  . Asthma  . H. pylori infection  . Supervision of high-risk pregnancy    PLAN: Will add reglan, famotidine, mylanta. GI consult as outpatient. Continue routine antenatal care.   Loma Boston JEHIEL 03/12/2011,7:02 PM

## 2011-03-13 DIAGNOSIS — O0990 Supervision of high risk pregnancy, unspecified, unspecified trimester: Secondary | ICD-10-CM

## 2011-03-13 DIAGNOSIS — O211 Hyperemesis gravidarum with metabolic disturbance: Secondary | ICD-10-CM

## 2011-03-13 MED ORDER — METOCLOPRAMIDE HCL 10 MG PO TABS: 10.0000 mg | ORAL_TABLET | Freq: Four times a day (QID) | ORAL | Status: DC | PRN

## 2011-03-13 MED ORDER — ALUM & MAG HYDROXIDE-SIMETH 200-200-20 MG/5ML PO SUSP: 30.0000 mL | ORAL | Status: AC | PRN

## 2011-03-13 NOTE — Plan of Care (Signed)
Problem: Consults Goal: Birthing Suites Patient Information Press F2 to bring up selections list   Pt < [redacted] weeks EGA     

## 2011-03-13 NOTE — Progress Notes (Signed)
Patient ID: [name], female   DOB: [birth year], 31 y.o.   MRN: [id number] [name]  [name] is a 31 y.o. [id number] at [redacted]w[redacted]d  who is admitted for hyperemesis and weight loss.   Length of Stay:  2  Days  Subjective: Patient has tolerated some solid foods overnight. No nausea or emesis. Patient reports some mild b/l lower quadrant pain consistent with round ligament pain Patient reports good fetal movement.  She reports no uterine contractions, no bleeding and no loss of fluid per vagina.  Vitals:  Blood pressure 111/79, pulse 87, temperature 98.3 F (36.8 C), temperature source Oral, resp. rate 20, height 5\' 6"  (1.676 m), weight 77.565 kg (171 lb), last menstrual period 09/19/2010, SpO2 100.00%. Physical Examination:  General appearance - alert, well appearing, and in no distress Chest - clear to auscultation, no wheezes, rales or rhonchi, symmetric air entry Heart - normal rate, regular rhythm, normal S1, S2, no murmurs, rubs, clicks or gallops Abdomen - soft, nontender, nondistended, no masses or organomegaly Extremities: extremities normal, atraumatic, no cyanosis or edema  Fetal Monitoring:  Baseline: 150s bpm  Labs:  Recent Results (from the past 24 hour(s))  GLUCOSE, CAPILLARY   Collection Time   03/12/11 10:10 AM      Component Value Range   Glucose-Capillary 77  70 - 99 (mg/dL)   Comment 1 Notify RN     Comment 2 Documented in Chart    URINALYSIS, ROUTINE W REFLEX MICROSCOPIC   Collection Time   03/12/11 10:15 AM      Component Value Range   Color, Urine YELLOW  YELLOW    APPearance CLEAR  CLEAR    Specific Gravity, Urine 1.025  1.005 - 1.030    pH 6.0  5.0 - 8.0    Glucose, UA NEGATIVE  NEGATIVE (mg/dL)   Hgb urine dipstick NEGATIVE  NEGATIVE    Bilirubin Urine NEGATIVE  NEGATIVE    Ketones, ur >80 (*) NEGATIVE (mg/dL)   Protein, ur NEGATIVE  NEGATIVE (mg/dL)   Urobilinogen, UA 0.2  0.0 - 1.0 (mg/dL)   Nitrite NEGATIVE  NEGATIVE    Leukocytes, UA NEGATIVE  NEGATIVE   GLUCOSE, CAPILLARY   Collection Time   03/12/11  2:05 PM      Component Value Range   Glucose-Capillary 63 (*) 70 - 99 (mg/dL)   Comment 1 Notify RN     Comment 2 Documented in Chart    GLUCOSE, CAPILLARY   Collection Time   03/12/11  6:57 PM      Component Value Range   Glucose-Capillary 82  70 - 99 (mg/dL)  GLUCOSE, CAPILLARY   Collection Time   03/12/11  9:54 PM      Component Value Range   Glucose-Capillary 85  70 - 99 (mg/dL)  GLUCOSE, CAPILLARY   Collection Time   03/13/11  1:56 AM      Component Value Range   Glucose-Capillary 78  70 - 99 (mg/dL)  GLUCOSE, CAPILLARY   Collection Time   03/13/11  6:24 AM      Component Value Range   Glucose-Capillary 71  70 - 99 (mg/dL)   Medications:  Scheduled    . docusate sodium  100 mg Oral Daily  . escitalopram  20 mg Oral Daily  . famotidine  20 mg Oral BID  . insulin NPH  5 Units Subcutaneous BID  . pantoprazole (PROTONIX) IV  40 mg Intravenous QHS  . prenatal multivitamin  1 tablet  Oral Daily   I have reviewed the patient's current medications.  ASSESSMENT: Patient Active Problem List  Diagnoses  . Type 2 diabetes mellitus  . Hyperemesis gravidarum before end of [redacted] week gestation, dehydration  . Chronic hypertension in pregnancy  . Depression  . Asthma  . H. pylori infection  . Supervision of high-risk pregnancy    PLAN: Patient medically stable to be discharged home Plan for outpatient follow-up with GI due to multiple admission for hyperemesis Discharge instructions provided   Brilee Port 03/13/2011,9:30 AM

## 2011-03-13 NOTE — Discharge Instructions (Signed)
Hyperemesis Gravidarum Hyperemesis gravidarum is a severe form of nausea and vomiting that happens during pregnancy. Hyperemesis is worse than morning sickness. It may cause a woman to have nausea or vomiting all day for many days. It may keep a woman from eating and drinking enough food and liquids. Hyperemesis usually occurs during the first half (the first 20 weeks) of pregnancy. It often goes away once a woman is in her second half of pregnancy. However, sometimes hyperemesis continues through an entire pregnancy.  CAUSES  The cause of this condition is not completely known but is thought to be due to changes in the body's hormones when pregnant. It could be the high level of the pregnancy hormone or an increase in estrogen in the body.  SYMPTOMS   Severe nausea and vomiting.   Nausea that does not go away.   Vomiting that does not allow you to keep any food down.   Weight loss and body fluid loss (dehydration).   Having no desire to eat or not liking food you have previously enjoyed.  DIAGNOSIS  Your caregiver may ask you about your symptoms. Your caregiver may also order blood tests and urine tests to make sure something else is not causing the problem.  TREATMENT  You may only need medicine to control the problem. If medicines do not control the nausea and vomiting, you will be treated in the hospital to prevent dehydration, acidosis, weight loss, and changes in the electrolytes in your body that may harm the unborn baby (fetus). You may need intravenous (IV) fluids.  HOME CARE INSTRUCTIONS   Take all medicine as directed by your caregiver.   Try eating a couple of dry crackers or toast in the morning before getting out of bed.   Avoid foods and smells that upset your stomach.   Avoid fatty and spicy foods. Eat 5 to 6 small meals a day.   Do not drink when eating meals. Drink between meals.   For snacks, eat high protein foods, such as cheese. Eat or suck on things that have  ginger in them. Ginger helps nausea.   Avoid food preparation. The smell of food can spoil your appetite.   Avoid iron pills and iron in your multivitamins until after 3 to 4 months of being pregnant.  SEEK MEDICAL CARE IF:   Your abdominal pain increases since the last time you saw your caregiver.   You have a severe headache.   You develop vision problems.   You feel you are losing weight.  SEEK IMMEDIATE MEDICAL CARE IF:   You are unable to keep fluids down.   You vomit blood.   You have Ondre Salvetti nausea and vomiting.   You have a fever.   You have excessive weakness, dizziness, fainting, or extreme thirst.  MAKE SURE YOU:   Understand these instructions.   Will watch your condition.   Will get help right away if you are not doing well or get worse.  Document Released: 01/04/2005 Document Revised: 09/16/2010 Document Reviewed: 04/06/2010 [facility] Patient Information 2012 [place].

## 2011-03-13 NOTE — Discharge Summary (Signed)
Physician Discharge Summary  Patient ID: [name] MRN: [id number] DOB/AGE: 1980-08-24 31 y.o.  Admit date: 03/11/2011 Discharge date: 03/13/2011  Admission Diagnoses: hyperemesis gravidarum  Discharge Diagnoses: same Active Problems:  * No active hospital problems. *    Discharged Condition: good  Hospital Course: 31 yo [id number] admitted for hyperemesis gravidarum. After receiving IV hydration and antiemetics, patient was able to tolerate solid foods in the evening of hospital day 1 and on hospital day 2. She denied any other obstetrical complaints. Patient was found to be stable for discharge with plans for outpatient consultation with GI. Her diabetes remained well controlled on her current regimen  Consults: None   Treatments: IV hydration  Discharge Exam: Blood pressure 111/79, pulse 87, temperature 98.3 F (36.8 C), temperature source Oral, resp. rate 20, height 5\' 6"  (1.676 m), weight 77.565 kg (171 lb), last menstrual period 09/19/2010, SpO2 100.00%. General appearance: alert, cooperative and no distress GI: soft, non-tender; bowel sounds normal; no masses,  no organomegaly Extremities: extremities normal, atraumatic, no cyanosis or edema and Homans sign is negative, no sign of DVT  Disposition: 01-Home or Self Care  Discharge Orders    Future Appointments: Provider: Department: Dept Phone: Center:   03/26/2011 2:15 PM [facility] [place] [facility] [phone] [facility]     Medication List  As of 03/13/2011  9:41 AM   TAKE these medications         albuterol 108 (90 BASE) MCG/ACT inhaler   Commonly known as: PROVENTIL HFA;VENTOLIN HFA   Inhale 2 puffs into the lungs every 6 (six) hours as needed. For shortness of breath      alum & mag hydroxide-simeth 200-200-20 MG/5ML suspension   Commonly known as: MAALOX/MYLANTA   Take 30 mLs by mouth every 4 (four) hours as needed.      escitalopram 20 MG tablet   Commonly known as: LEXAPRO   Take 1 tablet (20 mg total) by  mouth daily.      insulin aspart 100 UNIT/ML injection   Commonly known as: novoLOG   Inject 10 Units into the skin 3 (three) times daily before meals.      insulin NPH 100 UNIT/ML injection   Commonly known as: HUMULIN N,NOVOLIN N   Inject 5 Units into the skin 2 (two) times daily. 5 units SQ 10 am and 10 pm.      metoCLOPramide 10 MG tablet   Commonly known as: REGLAN   Take 1 tablet (10 mg total) by mouth every 6 (six) hours as needed.      omeprazole 40 MG capsule   Commonly known as: PRILOSEC   Take 1 capsule (40 mg total) by mouth daily.      prenatal multivitamin Tabs   Take 1 tablet by mouth daily.      promethazine 25 MG suppository   Commonly known as: PHENERGAN   Place 25 mg rectally every 6 (six) hours as needed. For nausea           Follow-up Information    Follow up with [facility].   Contact information:   [address] [place] [phone]         Signed: Mora Bellman 03/13/2011, 9:41 AM

## 2011-03-14 ENCOUNTER — Inpatient Hospital Stay (HOSPITAL_COMMUNITY)
Admission: AD | Admit: 2011-03-14 | Discharge: 2011-03-14 | Disposition: A | Discharge status: Home / Self Care | Payer: Medicaid Other | Source: Ambulatory Visit | Attending: Obstetrics & Gynecology | Admitting: Obstetrics & Gynecology

## 2011-03-14 DIAGNOSIS — R109 Unspecified abdominal pain: Secondary | ICD-10-CM | POA: Insufficient documentation

## 2011-03-14 DIAGNOSIS — O10019 Pre-existing essential hypertension complicating pregnancy, unspecified trimester: Secondary | ICD-10-CM | POA: Insufficient documentation

## 2011-03-14 DIAGNOSIS — N949 Unspecified condition associated with female genital organs and menstrual cycle: Secondary | ICD-10-CM

## 2011-03-14 DIAGNOSIS — O212 Late vomiting of pregnancy: Secondary | ICD-10-CM | POA: Insufficient documentation

## 2011-03-14 MED ORDER — ABDOMINAL BINDER/ELASTIC LARGE MISC: 1.0000 | Freq: Every morning | Status: DC

## 2011-03-14 MED ORDER — ALUM & MAG HYDROXIDE-SIMETH 200-200-20 MG/5ML PO SUSP
30.0000 mL | Freq: Once | ORAL | Status: AC
  Administered 2011-03-14: 30 mL via ORAL
  Filled 2011-03-14: qty 30

## 2011-03-14 MED ORDER — GI COCKTAIL ~~LOC~~
30.0000 mL | Freq: Once | ORAL | Status: AC
  Administered 2011-03-14: 30 mL via ORAL
  Filled 2011-03-14: qty 30

## 2011-03-14 NOTE — ED Notes (Signed)
[name] [employer] called to pick up patient.

## 2011-03-14 NOTE — Progress Notes (Signed)
Patient reports being at [facility] getting prescriptions filled and had sudden onset of lower abdominal pain which she rates 8/10, pt was discharged from [facility] yesterday.

## 2011-03-14 NOTE — ED Provider Notes (Signed)
History     Chief Complaint  Patient presents with  . Abdominal Pain   HPI THis is a 31 y.o. at [redacted]w[redacted]d who presents via EMS with c/o sharp lower abdominal pain which started when she was walking to the Pharmacy to get her prescriptions filled. States has never had this pain before. States Pharmacy is a 30 minute walk from her house.    Had BM yesterday, none today.  Also c/o reflux.  States [place], Protonix do not work. Wants Maalox,  But is angry that Medicaid won't pay for it as OTC med.  States "I just want this whole thing overwith".  OB History    Grav Para Term Preterm Abortions TAB SAB Ect Mult Living   4 2 1 1 1  0 0 1 0 2      Past Medical History  Diagnosis Date  . Asthma   . Diabetes mellitus   . Mental disorder   . Depression   . Anxiety   . Hypertension     Past Surgical History  Procedure Date  . No past surgeries     Family History  Problem Relation Age of Onset  . Heart disease Mother     CHF  . Anesthesia problems Neg Hx   . Diabetes Sister     History  Substance Use Topics  . Smoking status: Never Smoker   . Smokeless tobacco: Never Used  . Alcohol Use: No     not with preg    Allergies:  Allergies  Allergen Reactions  . Dilaudid (Hydromorphone Hcl) Nausea And Vomiting  . Morphine And Related Nausea And Vomiting    Prescriptions prior to admission  Medication Sig Dispense Refill  . albuterol (PROVENTIL HFA;VENTOLIN HFA) 108 (90 BASE) MCG/ACT inhaler Inhale 2 puffs into the lungs every 6 (six) hours as needed. For shortness of breath      . alum & mag hydroxide-simeth (MAALOX/MYLANTA) 200-200-20 MG/5ML suspension Take 30 mLs by mouth every 4 (four) hours as needed.  355 mL  1  . escitalopram (LEXAPRO) 20 MG tablet Take 1 tablet (20 mg total) by mouth daily.  30 tablet  3  . insulin aspart (NOVOLOG) 100 UNIT/ML injection Inject 10 Units into the skin 3 (three) times daily before meals.  10 mL  2  . insulin NPH (HUMULIN N,NOVOLIN N) 100 UNIT/ML  injection Inject 5 Units into the skin 2 (two) times daily. 5 units SQ 10 am and 10 pm.      . metoCLOPramide (REGLAN) 10 MG tablet Take 1 tablet (10 mg total) by mouth every 6 (six) hours as needed.  20 tablet  1  . omeprazole (PRILOSEC) 40 MG capsule Take 1 capsule (40 mg total) by mouth daily.  30 capsule  5  . Prenatal Vit-Fe Fumarate-FA (PRENATAL MULTIVITAMIN) TABS Take 1 tablet by mouth daily.        . promethazine (PHENERGAN) 25 MG suppository Place 25 mg rectally every 6 (six) hours as needed. For nausea        ROS As above   Physical Exam   Blood pressure 106/73, pulse 102, temperature 98.6 F (37 C), resp. rate 16, last menstrual period 09/19/2010.  Physical Exam  Constitutional: She is oriented to person, place, and time. She appears well-developed and well-nourished. No distress.       Affect angry.  HENT:  Head: Normocephalic.  Cardiovascular: Normal rate.   Respiratory: Effort normal.  GI: Soft. She exhibits no distension and no mass. There  is tenderness (over bilateral round ligaments). There is no rebound and no guarding.  Genitourinary: Vagina normal and uterus normal. No vaginal discharge found.       Cervix long and closed. FHR reassuring No contractions.   Musculoskeletal: Normal range of motion.  Neurological: She is alert and oriented to person, place, and time.  Skin: Skin is warm and dry.  Psychiatric: She has a normal mood and affect.    MAU Course  Procedures  Assessment and Plan  A:  IUP at [redacted]w[redacted]d      Cyclic vomiting/spitting with multiple admissions     Class C DM     Chronic HTN, well controlled today     Probable round ligament pain exacerbated by 30 minute walk     Acid reflux P:  Will give Maalox for heartburn, will give an Rx for Maalox in hopes [facility] may pay for it.       Recommend abdominal binder, instructed to wear only under belly and not too tight. Use only when up walking.       D/C home  [facility] 03/14/2011, 12:53 PM

## 2011-03-14 NOTE — ED Notes (Signed)
House coverage notified patient does not have any available transportation home, she will bring taxi voucher, notified patient of plan.

## 2011-03-14 NOTE — Discharge Instructions (Signed)
Round Ligament Pain The round ligament is made up of muscle and fibrous tissue. It is attached to the uterus near the fallopian tube. The round ligament is located on both sides of the uterus and helps support the position of the uterus. It usually begins in the second trimester of pregnancy when the uterus comes out of the pelvis. The pain can come and go until the baby is delivered. Round ligament pain is not a serious problem and does not cause harm to the baby. CAUSE During pregnancy the uterus grows the most from the second trimester to delivery. As it grows, it stretches and slightly twists the round ligaments. When the uterus leans from one side to the other, the round ligament on the opposite side pulls and stretches. This can cause pain. SYMPTOMS  Pain can occur on one side or both sides. The pain is usually a short, sharp, and pinching-like. Sometimes it can be a dull, lingering and aching pain. The pain is located in the lower side of the abdomen or in the groin. The pain is internal and usually starts deep in the groin and moves up to the outside of the hip area. Pain can occur with:  Sudden change in position like getting out of bed or a chair.   Rolling over in bed.   Coughing or sneezing.   Walking too much.   Any type of physical activity.  DIAGNOSIS  Your caregiver will make sure there are no serious problems causing the pain. When nothing serious is found, the symptoms usually indicate that the pain is from the round ligament. TREATMENT   Sit down and relax when the pain starts.   Flex your knees up to your belly.   Lay on your side with a pillow under your belly (abdomen) and another one between your legs.   Sit in a hot bath for 15 to 20 minutes or until the pain goes away.  HOME CARE INSTRUCTIONS   Only take over-the-counter or prescriptions medicines for pain, discomfort or fever as directed by your caregiver.   Sit and stand slowly.   Avoid long walks if it  causes pain.   Stop or lessen your physical activities if it causes pain.  SEEK MEDICAL CARE IF:   The pain does not go away with any of your treatment.   You need stronger medication for the pain.   You develop back pain that you did not have before with the side pain.  SEEK IMMEDIATE MEDICAL CARE IF:   You develop a temperature of 102 F (38.9 C) or higher.   You develop uterine contractions.   You develop vaginal bleeding.   You develop nausea, vomiting or diarrhea.   You develop chills.   You have pain when you urinate.  Document Released: 10/14/2007 Document Revised: 09/16/2010 Document Reviewed: 10/14/2007 [facility] Patient Information 2012 [place].

## 2011-03-14 NOTE — ED Notes (Signed)
Report given patient arrived via EMS with c/o sudden onset of lower abdominal pain was discharge yesterday from [facility]

## 2011-03-15 ENCOUNTER — Telehealth: Payer: Self-pay | Admitting: *Deleted

## 2011-03-15 ENCOUNTER — Other Ambulatory Visit: Payer: Self-pay | Admitting: Obstetrics and Gynecology

## 2011-03-15 DIAGNOSIS — O210 Mild hyperemesis gravidarum: Secondary | ICD-10-CM

## 2011-03-15 NOTE — Telephone Encounter (Signed)
Telephoned pt home # was not a working #. Telephoned pt's mobile # pt was unavailable. Telephoned [facility] [phone] and they do not accept Pregnancy Medicaid pt would need to pay $184. Pt also has appt scheduled her March 4th at 10:15am and pt is unaware. Also spoke with Dr. Elly Modena can not find GI Dr that accepts Pregnancy [employer]. She told me to advise pt that she would be setting the pt up for IV home hydration.

## 2011-03-15 NOTE — Telephone Encounter (Signed)
Dr.Constant wanted pt to have Home Health to come out to pt home to admin IV hydration (1 liter LR with phenergan) bid.  [place] and spoke with Manuela Schwartz [phone]) and informed her of pts information and faxed order to her.

## 2011-03-15 NOTE — Telephone Encounter (Signed)
Please schedule a [facility] appointment on 03/22/2011. Patient discharged on 2/23.  Also please schedule GI appointment for evaluation of hyperemesis in pregnancy requiring multiple admissions to hospital. It would be great if this appointment can be obtained ASAP.  Peggy

## 2011-03-16 ENCOUNTER — Encounter (HOSPITAL_COMMUNITY): Payer: Self-pay | Admitting: *Deleted

## 2011-03-16 ENCOUNTER — Inpatient Hospital Stay (HOSPITAL_COMMUNITY)
Admission: AD | Admit: 2011-03-16 | Discharge: 2011-03-16 | Disposition: A | Discharge status: Home / Self Care | Payer: Medicaid Other | Source: Ambulatory Visit | Attending: Family Medicine | Admitting: Family Medicine

## 2011-03-16 DIAGNOSIS — O219 Vomiting of pregnancy, unspecified: Secondary | ICD-10-CM

## 2011-03-16 DIAGNOSIS — O212 Late vomiting of pregnancy: Secondary | ICD-10-CM | POA: Insufficient documentation

## 2011-03-16 MED ORDER — PROMETHAZINE HCL 25 MG RE SUPP: 12.5000 mg | RECTAL | Status: DC

## 2011-03-16 MED ORDER — LACTATED RINGERS IV BOLUS (SEPSIS): 1000.0000 mL | INTRAVENOUS | Status: DC

## 2011-03-16 MED ORDER — PROMETHAZINE HCL 25 MG RE SUPP: 25.0000 mg | RECTAL | Status: DC

## 2011-03-16 NOTE — Discharge Instructions (Signed)
You have an appointment scheduled at the [facility] on Monday 03/22/11 at 10:15 am.  Please call [phone] to cancel/reschedule.  Nausea and Vomiting Nausea is a sick feeling that often comes before throwing up (vomiting). Vomiting is a reflex where stomach contents come out of your mouth. Vomiting can cause severe loss of body fluids (dehydration). Children and elderly adults can become dehydrated quickly, especially if they also have diarrhea. Nausea and vomiting are symptoms of a condition or disease. It is important to find the cause of your symptoms. CAUSES   Direct irritation of the stomach lining. This irritation can result from increased acid production (gastroesophageal reflux disease), infection, food poisoning, taking certain medicines (such as nonsteroidal anti-inflammatory drugs), alcohol use, or tobacco use.   Signals from the brain.These signals could be caused by a headache, heat exposure, an inner ear disturbance, increased pressure in the brain from injury, infection, a tumor, or a concussion, pain, emotional stimulus, or metabolic problems.   An obstruction in the gastrointestinal tract (bowel obstruction).   Illnesses such as diabetes, hepatitis, gallbladder problems, appendicitis, kidney problems, cancer, sepsis, atypical symptoms of a heart attack, or eating disorders.   Medical treatments such as chemotherapy and radiation.   Receiving medicine that makes you sleep (general anesthetic) during surgery.  DIAGNOSIS Your caregiver may ask for tests to be done if the problems do not improve after a few days. Tests may also be done if symptoms are severe or if the reason for the nausea and vomiting is not clear. Tests may include:  Urine tests.   Blood tests.   Stool tests.   Cultures (to look for evidence of infection).   X-rays or other imaging studies.  Test results can help your caregiver make decisions about treatment or the need for additional  tests. TREATMENT You need to stay well hydrated. Drink frequently but in small amounts.You may wish to drink water, sports drinks, clear broth, or eat frozen ice pops or gelatin dessert to help stay hydrated.When you eat, eating slowly may help prevent nausea.There are also some antinausea medicines that may help prevent nausea. HOME CARE INSTRUCTIONS   Take all medicine as directed by your caregiver.   If you do not have an appetite, do not force yourself to eat. However, you must continue to drink fluids.   If you have an appetite, eat a normal diet unless your caregiver tells you differently.   Eat a variety of complex carbohydrates (rice, wheat, potatoes, bread), lean meats, yogurt, fruits, and vegetables.   Avoid high-fat foods because they are more difficult to digest.   Drink enough water and fluids to keep your urine clear or pale yellow.   If you are dehydrated, ask your caregiver for specific rehydration instructions. Signs of dehydration may include:   Severe thirst.   Dry lips and mouth.   Dizziness.   Dark urine.   Decreasing urine frequency and amount.   Confusion.   Rapid breathing or pulse.  SEEK IMMEDIATE MEDICAL CARE IF:   You have blood or brown flecks (like coffee grounds) in your vomit.   You have black or bloody stools.   You have a severe headache or stiff neck.   You are confused.   You have severe abdominal pain.   You have chest pain or trouble breathing.   You do not urinate at least once every 8 hours.   You develop cold or clammy skin.   You continue to vomit for longer  than 24 to 48 hours.   You have a fever.  MAKE SURE YOU:   Understand these instructions.   Will watch your condition.   Will get help right away if you are not doing well or get worse.  Document Released: 01/04/2005 Document Revised: 09/16/2010 Document Reviewed: 06/03/2010 [facility] Patient Information 2012 [place], [place].

## 2011-03-16 NOTE — Telephone Encounter (Signed)
Katharine Look from [facility] called and stated that they were unable to obtain a peripheral line and wanted to know if the provider would give an order for PICC line.  I spoke with Dr. Kennon Rounds and informed her what Katharine Look told me.  Per Dr. Kennon Rounds she would prefer for pt not to receive PICC line but for the pt to come to MAU for evaluation and better regimen.  Katharine Look stated that she would inform the pt. And pt would be at MAU in the next hour.

## 2011-03-16 NOTE — Progress Notes (Signed)
Pt brought in by EMS, states her home health nurse talked to Dr. Kennon Rounds & MD wanted her to come in for PICC line.  Pt C/O lower abd pain x 3 days that is worse when she walks.  Pt denies any bleeding or LOF.  Pt states she is still having problems keeping food down.

## 2011-03-16 NOTE — ED Provider Notes (Signed)
Chief Complaint:  Emesis During Pregnancy   HPI  [name] is  31 y.o. [id number] at [redacted]w[redacted]d presents to MAU via EMS because home health nurse was unable to start an IV and called MD about possibly starting a PICC line. She reports good fetal movement, states that she is hungry and home health nurse did not want her to eat before coming to the hospital, and denies LOF, vaginal bleeding, vaginal discharge/itching/burning, dysuria, dizziness, h/a, or fever/chills.     Obstetrical/Gynecological History: OB History    Grav Para Term Preterm Abortions TAB SAB Ect Mult Living   4 2 1 1 1  0 0 1 0 2      Past Medical History: Past Medical History  Diagnosis Date  . Asthma   . Diabetes mellitus   . Mental disorder   . Depression   . Anxiety   . Hypertension     Past Surgical History: Past Surgical History  Procedure Date  . No past surgeries     Family History: Family History  Problem Relation Age of Onset  . Heart disease Mother     CHF  . Anesthesia problems Neg Hx   . Diabetes Sister     Social History: History  Substance Use Topics  . Smoking status: Never Smoker   . Smokeless tobacco: Never Used  . Alcohol Use: No     not with preg    Allergies:  Allergies  Allergen Reactions  . Dilaudid (Hydromorphone Hcl) Nausea And Vomiting  . Morphine And Related Nausea And Vomiting    Meds:  Prescriptions prior to admission  Medication Sig Dispense Refill  . albuterol (PROVENTIL HFA;VENTOLIN HFA) 108 (90 BASE) MCG/ACT inhaler Inhale 2 puffs into the lungs every 6 (six) hours as needed. For shortness of breath      . alum & mag hydroxide-simeth (MAALOX/MYLANTA) 200-200-20 MG/5ML suspension Take 30 mLs by mouth every 4 (four) hours as needed.  355 mL  1  . escitalopram (LEXAPRO) 20 MG tablet Take 1 tablet (20 mg total) by mouth daily.  30 tablet  3  . insulin aspart (NOVOLOG) 100 UNIT/ML injection Inject 10 Units into the skin 3 (three) times daily before meals.  10 mL  2    . insulin NPH (HUMULIN N,NOVOLIN N) 100 UNIT/ML injection Inject 5 Units into the skin 2 (two) times daily. 5 units SQ 10 am and 10 pm.      . metoCLOPramide (REGLAN) 10 MG tablet Take 1 tablet (10 mg total) by mouth every 6 (six) hours as needed.  20 tablet  1  . omeprazole (PRILOSEC) 40 MG capsule Take 1 capsule (40 mg total) by mouth daily.  30 capsule  5  . Prenatal Vit-Fe Fumarate-FA (PRENATAL MULTIVITAMIN) TABS Take 1 tablet by mouth daily.        . promethazine (PHENERGAN) 25 MG suppository Place 25 mg rectally every 6 (six) hours as needed. For nausea        Review of Systems Denies contractions, abdominal pain or vaginal bleeding. Reports good fetal activity.      Physical Exam  Blood pressure 107/77, pulse 83, temperature 96.9 F (36.1 C), temperature source Oral, resp. rate 18, weight 77.384 kg (170 lb 9.6 oz), last menstrual period 09/19/2010. GENERAL: Well-developed, well-nourished female in no acute distress.  LUNGS: Clear to auscultation bilaterally.  HEART: Regular rate and rhythm. ABDOMEN: Soft, nontender, nondistended, gravid.  EXTREMITIES: Nontender, no edema, 2+ distal pulses. Cervical Exam: Deferred  FHT:  Baseline  rate 150 bpm   Variability moderate  Accelerations present   Decelerations none Contractions: None   Labs: No results found for this or any previous visit (from the past 24 hour(s)). Management:  Called Dr Kennon Rounds upon pt arrival.  Plan to attempt IV access in MAU.   RN attempted IV x2 and was unsuccessful.  Pt ordered dinner tray and is currently eating.  Plan to wait 1 hour after pt meal and discharge if no vomiting.  Phenergan suppository ordered.  Pt ate 1/2 of meal (fish, baked potato, peach cobbler) 2 hours ago and has not vomited in MAU.  No phenergan or other antiemetics administered during MAU stay.  Assessment/Plan: A:  N/V of pregnancy  P: D/C home F/U in [facility], appointment scheduled on Monday morning    LEFTWICH-KIRBY,  Donnis Pecha 2/26/201312:01 PM

## 2011-03-16 NOTE — Progress Notes (Signed)
CNM informed of 2 unsuccessful IV attempts, pt asking for "shot of phenergan."

## 2011-03-16 NOTE — Telephone Encounter (Signed)
Attempted to contact pt to inform her of plan of care. She is still unavailable on mobile #. I called her sister, Otila Kluver and left message to have [place] call me for important information.

## 2011-03-16 NOTE — ED Provider Notes (Signed)
Chart reviewed and agree with management and plan.  

## 2011-03-19 ENCOUNTER — Telehealth: Payer: Self-pay | Admitting: *Deleted

## 2011-03-19 NOTE — Telephone Encounter (Signed)
Call received from [employer]. She stated that they had been unable to gain IV access as ordered by Dr. Elly Modena. Pt had visit in MAU on 03/16/11 where IV access also was not possible. During MAU visit pt was able to tolerate solid food without the aid of anti-emetic medication and was D/C'd to home. Amber was calling to clarify if [place] orders for IV hydration are now discontinued.  I stated that since no mention was made in the MAU note about the continued need for IV hydration, it would be fine to put the order on hold until pt has follow up clinic visit on 03/22/11.  Amber voiced understanding.

## 2011-03-22 ENCOUNTER — Encounter: Payer: Medicaid Other | Admitting: Family Medicine

## 2011-03-26 ENCOUNTER — Ambulatory Visit (HOSPITAL_COMMUNITY)
Admission: RE | Admit: 2011-03-26 | Discharge: 2011-03-26 | Disposition: A | Discharge status: Home / Self Care | Payer: Medicaid Other | Source: Ambulatory Visit | Attending: Family Medicine | Admitting: Family Medicine

## 2011-03-26 ENCOUNTER — Other Ambulatory Visit (HOSPITAL_COMMUNITY): Payer: Self-pay | Admitting: Maternal and Fetal Medicine

## 2011-03-26 DIAGNOSIS — O10919 Unspecified pre-existing hypertension complicating pregnancy, unspecified trimester: Secondary | ICD-10-CM

## 2011-03-26 DIAGNOSIS — Z8751 Personal history of pre-term labor: Secondary | ICD-10-CM | POA: Insufficient documentation

## 2011-03-26 DIAGNOSIS — E119 Type 2 diabetes mellitus without complications: Secondary | ICD-10-CM

## 2011-03-26 DIAGNOSIS — O24919 Unspecified diabetes mellitus in pregnancy, unspecified trimester: Secondary | ICD-10-CM | POA: Insufficient documentation

## 2011-03-28 ENCOUNTER — Inpatient Hospital Stay (HOSPITAL_COMMUNITY)
Admission: AD | Admit: 2011-03-28 | Discharge: 2011-03-28 | Disposition: A | Discharge status: Home / Self Care | Payer: Medicaid Other | Source: Ambulatory Visit | Attending: Obstetrics & Gynecology | Admitting: Obstetrics & Gynecology

## 2011-03-28 ENCOUNTER — Encounter (HOSPITAL_COMMUNITY): Payer: Self-pay | Admitting: *Deleted

## 2011-03-28 DIAGNOSIS — O219 Vomiting of pregnancy, unspecified: Secondary | ICD-10-CM

## 2011-03-28 DIAGNOSIS — K089 Disorder of teeth and supporting structures, unspecified: Secondary | ICD-10-CM | POA: Insufficient documentation

## 2011-03-28 DIAGNOSIS — O212 Late vomiting of pregnancy: Secondary | ICD-10-CM | POA: Insufficient documentation

## 2011-03-28 DIAGNOSIS — E119 Type 2 diabetes mellitus without complications: Secondary | ICD-10-CM

## 2011-03-28 DIAGNOSIS — O10919 Unspecified pre-existing hypertension complicating pregnancy, unspecified trimester: Secondary | ICD-10-CM

## 2011-03-28 DIAGNOSIS — O99891 Other specified diseases and conditions complicating pregnancy: Secondary | ICD-10-CM | POA: Insufficient documentation

## 2011-03-28 HISTORY — DX: Mild hyperemesis gravidarum: O21.0

## 2011-03-28 LAB — URINE MICROSCOPIC-ADD ON

## 2011-03-28 LAB — URINALYSIS, ROUTINE W REFLEX MICROSCOPIC
Hgb urine dipstick: NEGATIVE
Specific Gravity, Urine: >1.03 — ABNORMAL HIGH (ref 1.005–1.030)
Urobilinogen, UA: 0.2 mg/dL (ref 0.0–1.0)

## 2011-03-28 MED ORDER — LACTATED RINGERS IV BOLUS (SEPSIS): 500.0000 mL | Freq: Once | INTRAVENOUS | Status: DC

## 2011-03-28 MED ORDER — ONDANSETRON 8 MG/NS 50 ML IVPB
8.0000 mg | Freq: Once | INTRAVENOUS | Status: AC
  Administered 2011-03-28: 8 mg via INTRAVENOUS
  Filled 2011-03-28: qty 8

## 2011-03-28 MED ORDER — LACTATED RINGERS IV SOLN
INTRAVENOUS | Status: DC
  Administered 2011-03-28: 06:00:00 via INTRAVENOUS

## 2011-03-28 MED ORDER — HYDROCODONE-ACETAMINOPHEN 5-325 MG PO TABS: 1.0000 | ORAL_TABLET | Freq: Four times a day (QID) | ORAL | Status: DC | PRN

## 2011-03-28 MED ORDER — HYDROCODONE-ACETAMINOPHEN 5-325 MG PO TABS
1.0000 | ORAL_TABLET | Freq: Once | ORAL | Status: AC
  Administered 2011-03-28: 1 via ORAL
  Filled 2011-03-28: qty 1

## 2011-03-28 MED ORDER — PROMETHAZINE HCL 25 MG/ML IJ SOLN
12.5000 mg | INTRAMUSCULAR | Status: AC
  Administered 2011-03-28: 25 mg via INTRAVENOUS
  Filled 2011-03-28: qty 1

## 2011-03-28 MED ORDER — GI COCKTAIL ~~LOC~~
30.0000 mL | Freq: Once | ORAL | Status: AC
  Administered 2011-03-28: 30 mL via ORAL
  Filled 2011-03-28: qty 30

## 2011-03-28 MED ORDER — SODIUM CHLORIDE 0.9 % IV SOLN
25.0000 mg | Freq: Once | INTRAVENOUS | Status: AC
  Administered 2011-03-28: 25 mg via INTRAVENOUS
  Filled 2011-03-28: qty 1

## 2011-03-28 NOTE — ED Notes (Signed)
Pt took sip of apple juice and now sleeping. Dr Helene Kelp notified of pt's status. IVFs almost infused. Will see p t

## 2011-03-28 NOTE — ED Notes (Signed)
Dr Helene Kelp in to see pt

## 2011-03-28 NOTE — ED Notes (Signed)
Dr Helene Kelp and Derrill Memo CNM aware EFM removed due to pt's moving in bed and unable to obtain tracing

## 2011-03-28 NOTE — Progress Notes (Signed)
Dr. Holt at bedside.

## 2011-03-28 NOTE — ED Provider Notes (Signed)
[name] is a 31 y.o. female [phone] who presents 27 weeks 1 day for left tooth ache and recurrent emesis. She has an extensive history of cyclic vomiting and hyperemesis gravidarum with a workup with GI in the past. The emesis has been worse for the past 3 days, unable to keep anything down. Mild amount of blood in emesis noted by nursing. Patient notes that it is occasional and varies between bright red and dark red. PO phenergan at home not working. Denies any marijuana use since last admission. Pain with heartburn, trying Tums, Maalox without improvement. Also has hx of diabetes, BS have been 70 to 50 recently.   History    OB History    Grav Para Term Preterm Abortions TAB SAB Ect Mult Living   4 2 1 1 1  0 0 1 0 2     Past Medical History  Diagnosis Date  . Asthma   . Diabetes mellitus   . Mental disorder   . Depression   . Anxiety   . Hypertension   . Hyperemesis arising during pregnancy    Past Surgical History  Procedure Date  . No past surgeries    Family History: family history includes Diabetes in her sister and Heart disease in her mother.  There is no history of Anesthesia problems. Social History:  reports that she has never smoked. She has never used smokeless tobacco. She reports that she uses illicit drugs (Cocaine and Marijuana). She reports that she does not drink alcohol.  Review of Systems  Constitutional: Negative for fever and diaphoresis.  Eyes: Negative for blurred vision and double vision.  Respiratory: Positive for cough. Negative for shortness of breath.   Cardiovascular: Negative for chest pain.  Gastrointestinal: Positive for heartburn and vomiting. Negative for abdominal pain, diarrhea and constipation.  Neurological: Positive for headaches.      Blood pressure 102/72, pulse 94, temperature 97.4 F (36.3 C), temperature source Oral, resp. rate 18, height 5\' 6"  (1.676 m), weight 79.493 kg (175 lb 4 oz), last menstrual period  09/19/2010. Exam  Physical Exam  Constitutional: She is oriented to person, place, and time. She appears well-developed and well-nourished. She appears distressed.  HENT:  Head: Normocephalic and atraumatic.  Mouth/Throat: Uvula is midline, oropharynx is clear and moist and mucous membranes are normal. Mucous membranes are not dry. She does not have dentures. No oral lesions. Abnormal dentition (left posterior maxilla teeth are partially missing. No active signs of infection.). Dental caries present. No dental abscesses, uvula swelling or lacerations. No oropharyngeal exudate.  Eyes: EOM are normal.  Neck: Normal range of motion.  Cardiovascular: Normal rate.   Respiratory: Effort normal.  GI: Soft. She exhibits distension (gravid).  Musculoskeletal: Normal range of motion.  Neurological: She is alert and oriented to person, place, and time.  Skin: Skin is warm and dry. She is not diaphoretic. No erythema.    Prenatal labs: ABO, Rh: A/POS/-- (11/20 0845) Antibody: NEG (11/20 0845) Rubella: 5.5 (11/20 0845) RPR: NON REAC (11/20 0845)  HBsAg: NEGATIVE (11/20 0845)  HIV: NON REACTIVE (11/20 0845)  GBS:     Management: Gave GI cocktail, IVF (LR with phenergan) and vicodin Added IV Zofran due to continuing symptoms Gave list of local dentists to evaluate tooth pain Pt vomited x1 upon arrival in MAU, no vomiting in 7+ hours with PO fluids given Reviewed pt symptoms, history with Dr Glo Herring and he agrees with POC for discharge and f/u with dental provider.  Assessment/Plan: 31  y.o. female [phone] at 70 weeks 1 day Hyperemesis Gravidarum vs chronic cyclic vomiting  D/C home Hydrocodone/Acetaminophen 5/325 x10 tabs for tooth pain F/U with dental provider this week F/U in [facility] Return to MAU as needed  Medication List  As of 03/28/2011  5:02 PM   TAKE these medications         acetaminophen 500 MG tablet   Commonly known as: TYLENOL   Take 500 mg by mouth as needed. Patient states  that she has been taking tylenol for toothache pain.  She states that she has been taking 500mg  tylenol continually for the 3 days.      albuterol 108 (90 BASE) MCG/ACT inhaler   Commonly known as: PROVENTIL HFA;VENTOLIN HFA   Inhale 2 puffs into the lungs every 6 (six) hours as needed. For shortness of breath      escitalopram 20 MG tablet   Commonly known as: LEXAPRO   Take 1 tablet (20 mg total) by mouth daily.      HYDROcodone-acetaminophen 5-325 MG per tablet   Commonly known as: NORCO   Take 1 tablet by mouth every 6 (six) hours as needed for pain.      insulin aspart 100 UNIT/ML injection   Commonly known as: novoLOG   Inject 10 Units into the skin 3 (three) times daily before meals.      insulin NPH 100 UNIT/ML injection   Commonly known as: HUMULIN N,NOVOLIN N   Inject 5 Units into the skin 2 (two) times daily. 5 units SQ 10 am and 10 pm.      omeprazole 40 MG capsule   Commonly known as: PRILOSEC   Take 1 capsule (40 mg total) by mouth daily.      prenatal multivitamin Tabs   Take 2 tablets by mouth daily.      promethazine 25 MG suppository   Commonly known as: PHENERGAN   Place 25 mg rectally every 6 (six) hours as needed. For nausea            Yates, Olivia Marina 03/28/2011, 10:21 AM

## 2011-03-28 NOTE — ED Notes (Signed)
Pt vomited and some bright blood noted in emesis.

## 2011-03-28 NOTE — ED Notes (Signed)
Dr Helene Kelp notified of pt's status. Will ck pt in about 30 mins

## 2011-03-28 NOTE — Progress Notes (Signed)
EFM d/ced. Pt unable to lay still due to tooth pain. Rocking in bed.

## 2011-03-28 NOTE — ED Notes (Signed)
Called Maureen Chatters CNM inquiring about plan of care for this patient, she will be down to evaluate her asap.

## 2011-03-28 NOTE — ED Notes (Signed)
Lattie Haw CNM into see patient.

## 2011-03-28 NOTE — ED Notes (Signed)
Pt states heartburn is alittle better

## 2011-03-28 NOTE — ED Notes (Signed)
Patient discharge home with her friend driving her.

## 2011-03-28 NOTE — ED Notes (Signed)
Explained to patient as soon as her ride comes she can leave patient verbalized an understanding of instructions given prescription for pain medication at her pharmacy.

## 2011-03-28 NOTE — Discharge Instructions (Signed)
Hyperemesis Gravidarum Hyperemesis gravidarum is a severe form of nausea and vomiting that happens during pregnancy. Hyperemesis is worse than morning sickness. It may cause a woman to have nausea or vomiting all day for many days. It may keep a woman from eating and drinking enough food and liquids. Hyperemesis usually occurs during the first half (the first 20 weeks) of pregnancy. It often goes away once a woman is in her second half of pregnancy. However, sometimes hyperemesis continues through an entire pregnancy.  CAUSES  The cause of this condition is not completely known but is thought to be due to changes in the body's hormones when pregnant. It could be the high level of the pregnancy hormone or an increase in estrogen in the body.  SYMPTOMS   Severe nausea and vomiting.   Nausea that does not go away.   Vomiting that does not allow you to keep any food down.   Weight loss and body fluid loss (dehydration).   Having no desire to eat or not liking food you have previously enjoyed.  DIAGNOSIS  Your caregiver may ask you about your symptoms. Your caregiver may also order blood tests and urine tests to make sure something else is not causing the problem.  TREATMENT  You may only need medicine to control the problem. If medicines do not control the nausea and vomiting, you will be treated in the hospital to prevent dehydration, acidosis, weight loss, and changes in the electrolytes in your body that may harm the unborn baby (fetus). You may need intravenous (IV) fluids.  HOME CARE INSTRUCTIONS   Take all medicine as directed by your caregiver.   Try eating a couple of dry crackers or toast in the morning before getting out of bed.   Avoid foods and smells that upset your stomach.   Avoid fatty and spicy foods. Eat 5 to 6 small meals a day.   Do not drink when eating meals. Drink between meals.   For snacks, eat high protein foods, such as cheese. Eat or suck on things that have  ginger in them. Ginger helps nausea.   Avoid food preparation. The smell of food can spoil your appetite.   Avoid iron pills and iron in your multivitamins until after 3 to 4 months of being pregnant.  SEEK MEDICAL CARE IF:   Your abdominal pain increases since the last time you saw your caregiver.   You have a severe headache.   You develop vision problems.   You feel you are losing weight.  SEEK IMMEDIATE MEDICAL CARE IF:   You are unable to keep fluids down.   You vomit blood.   You have constant nausea and vomiting.   You have a fever.   You have excessive weakness, dizziness, fainting, or extreme thirst.  MAKE SURE YOU:   Understand these instructions.   Will watch your condition.   Will get help right away if you are not doing well or get worse.  Document Released: 01/04/2005 Document Revised: 12/24/2010 Document Reviewed: 04/06/2010 [facility] Patient Information 2012 [place].  Dental Pain A tooth ache may be caused by cavities (tooth decay). Cavities expose the nerve of the tooth to air and hot or cold temperatures. It may come from an infection or abscess (also called a boil or furuncle) around your tooth. It is also often caused by dental caries (tooth decay). This causes the pain you are having. DIAGNOSIS  Your caregiver can diagnose this problem by exam. TREATMENT  If caused by an infection, it may be treated with medications which kill germs (antibiotics) and pain medications as prescribed by your caregiver. Take medications as directed.   Only take over-the-counter or prescription medicines for pain, discomfort, or fever as directed by your caregiver.   Whether the tooth ache today is caused by infection or dental disease, you should see your dentist as soon as possible for further care.  SEEK MEDICAL CARE IF: The exam and treatment you received today has been provided on an emergency basis only. This is not a substitute for complete medical or  dental care. If your problem worsens or new problems (symptoms) appear, and you are unable to meet with your dentist, call or return to this location. SEEK IMMEDIATE MEDICAL CARE IF:   You have a fever.   You develop redness and swelling of your face, jaw, or neck.   You are unable to open your mouth.   You have severe pain uncontrolled by pain medicine.  MAKE SURE YOU:   Understand these instructions.   Will watch your condition.   Will get help right away if you are not doing well or get worse.  Document Released: 01/04/2005 Document Revised: 12/24/2010 Document Reviewed: 08/23/2007 [facility] Patient Information 2012 [place].

## 2011-03-28 NOTE — ED Notes (Signed)
Dr. Helene Kelp into see patient inquired about plan of care will get back to RN with plan.

## 2011-03-28 NOTE — ED Provider Notes (Signed)
[name] is a 31 y.o. female [phone] who presents 27 weeks 1 day for left tooth ache and recurrent emesis. She has an extensive history of cyclic vomiting and hyperemesis gravidarum with a workup with GI in the past. The emesis has been worse for the past 3 days, unable to keep anything down. Mild amount of blood in emesis noted by nursing. Patient notes that it is occasional and varies between bright red and dark red. PO phenergan at home not working. Denies any marijuana use since last admission. Pain with heartburn, trying Tums, Maalox without improvement. Also has hx of diabetes, BS have been 70 to 86 recently.   Maternal Medical History:      OB History    Grav Para Term Preterm Abortions TAB SAB Ect Mult Living   4 2 1 1 1  0 0 1 0 2     Past Medical History  Diagnosis Date  . Asthma   . Diabetes mellitus   . Mental disorder   . Depression   . Anxiety   . Hypertension   . Hyperemesis arising during pregnancy    Past Surgical History  Procedure Date  . No past surgeries    Family History: family history includes Diabetes in her sister and Heart disease in her mother.  There is no history of Anesthesia problems. Social History:  reports that she has never smoked. She has never used smokeless tobacco. She reports that she uses illicit drugs (Cocaine and Marijuana). She reports that she does not drink alcohol.  Review of Systems  Constitutional: Negative for fever and diaphoresis.  Eyes: Negative for blurred vision and double vision.  Respiratory: Positive for cough. Negative for shortness of breath.   Cardiovascular: Negative for chest pain.  Gastrointestinal: Positive for heartburn and vomiting. Negative for abdominal pain, diarrhea and constipation.  Neurological: Positive for headaches.      Blood pressure 94/61, pulse 119, temperature 97 F (36.1 C), temperature source Oral, resp. rate 16, height 5\' 6"  (1.676 m), weight 79.493 kg (175 lb 4 oz), last menstrual period  09/19/2010. Exam Physical Exam  Constitutional: She is oriented to person, place, and time. She appears well-developed and well-nourished. She appears distressed.  HENT:  Head: Normocephalic and atraumatic.  Mouth/Throat: Uvula is midline, oropharynx is clear and moist and mucous membranes are normal. Mucous membranes are not dry. She does not have dentures. No oral lesions. Abnormal dentition (left posterior maxilla teeth are partially missing. No active signs of infection.). Dental caries present. No dental abscesses, uvula swelling or lacerations. No oropharyngeal exudate.  Eyes: EOM are normal.  Neck: Normal range of motion.  Cardiovascular: Normal rate.   Respiratory: Effort normal.  GI: Soft. She exhibits distension (gravid).  Musculoskeletal: Normal range of motion.  Neurological: She is alert and oriented to person, place, and time.  Skin: Skin is warm and dry. She is not diaphoretic. No erythema.    Prenatal labs: ABO, Rh: A/POS/-- (11/20 0845) Antibody: NEG (11/20 0845) Rubella: 5.5 (11/20 0845) RPR: NON REAC (11/20 0845)  HBsAg: NEGATIVE (11/20 0845)  HIV: NON REACTIVE (11/20 0845)  GBS:      Assessment/Plan: 31 y.o. female [id number] at 27 weeks 1 day Hyperemesis Gravidarum on chronic cyclic vomiting Gave GI cocktail, IVF (LR with phenergan) and vicodin Added IV Zofran due to continuing symptoms Gave list of local dentists to evaluate tooth pain  Will likely d/c home with rx for vicodin  [name] 03/28/2011, 3:51 AM

## 2011-03-28 NOTE — Progress Notes (Signed)
Pt states, " I've been worse ever since Thurs. If I drink water I throw it back up, and I have heartburn. I also have molar on the upper left that is hurting and all the vomiting isn't helping it."

## 2011-03-29 ENCOUNTER — Encounter: Payer: Medicaid Other | Admitting: Obstetrics & Gynecology

## 2011-03-31 NOTE — ED Provider Notes (Signed)
Attestation of Attending Supervision of Advanced Practitioner: Evaluation and management procedures were performed by the PA/NP/CNM/OB Fellow under my supervision/collaboration. Chart reviewed, and agree with management and plan.  Verita Schneiders, M.D. 03/31/2011 1:08 PM

## 2011-04-01 ENCOUNTER — Emergency Department (HOSPITAL_COMMUNITY)
Admission: EM | Admit: 2011-04-01 | Discharge: 2011-04-01 | Disposition: A | Discharge status: Home / Self Care | Payer: Medicaid Other | Attending: Emergency Medicine | Admitting: Emergency Medicine

## 2011-04-01 ENCOUNTER — Encounter (HOSPITAL_COMMUNITY): Payer: Self-pay | Admitting: Nurse Practitioner

## 2011-04-01 DIAGNOSIS — Z331 Pregnant state, incidental: Secondary | ICD-10-CM | POA: Insufficient documentation

## 2011-04-01 DIAGNOSIS — K5909 Other constipation: Secondary | ICD-10-CM | POA: Insufficient documentation

## 2011-04-01 DIAGNOSIS — E119 Type 2 diabetes mellitus without complications: Secondary | ICD-10-CM | POA: Insufficient documentation

## 2011-04-01 DIAGNOSIS — Z794 Long term (current) use of insulin: Secondary | ICD-10-CM | POA: Insufficient documentation

## 2011-04-01 DIAGNOSIS — R109 Unspecified abdominal pain: Secondary | ICD-10-CM | POA: Insufficient documentation

## 2011-04-01 DIAGNOSIS — I10 Essential (primary) hypertension: Secondary | ICD-10-CM | POA: Insufficient documentation

## 2011-04-01 MED ORDER — MAGNESIUM CITRATE PO SOLN
1.0000 | ORAL | Status: AC
  Administered 2011-04-01: 1 via ORAL
  Filled 2011-04-01: qty 296

## 2011-04-01 MED ORDER — BISACODYL 5 MG PO TBEC
10.0000 mg | DELAYED_RELEASE_TABLET | ORAL | Status: AC
  Administered 2011-04-01: 10 mg via ORAL
  Filled 2011-04-01: qty 2

## 2011-04-01 MED ORDER — PEG 3350-KCL-NABCB-NACL-NASULF 236 G PO SOLR: 4.0000 L | Freq: Once | ORAL | Status: AC

## 2011-04-01 MED ORDER — ONDANSETRON 4 MG PO TBDP
8.0000 mg | ORAL_TABLET | Freq: Once | ORAL | Status: AC
  Administered 2011-04-01: 8 mg via ORAL
  Filled 2011-04-01: qty 2

## 2011-04-01 NOTE — ED Notes (Signed)
Pt reports unable to have bowel movement x 2 weeks, reports nausea and vomiting over past days. C/o abd pain. [redacted] weeks pregnant

## 2011-04-01 NOTE — Discharge Instructions (Signed)
You received a laxative in the emergency department which should have your bowels working today. If this does not work, get the prescription for GoLYTELY filled and follow the instructions. Return to emergency department if your symptoms are getting worse. He wear, you were given a prescription for hydrocodone tablets when you were seen in the emergency department several days ago. Hydrocodone as a major side effect of constipation. You may need to take laxatives to keep your bowels working while you're taking the hydrocodone.  Constipation in Adults Constipation is having fewer than 2 bowel movements per week. Usually, the stools are hard. As we grow older, constipation is more common. If you try to fix constipation with laxatives, the problem may get worse. This is because laxatives taken over a long period of time make the colon muscles weaker. A low-fiber diet, not taking in enough fluids, and taking some medicines may make these problems worse. MEDICATIONS THAT MAY CAUSE CONSTIPATION  Water pills (diuretics).   Calcium channel blockers (used to control blood pressure and for the heart).   Certain pain medicines (narcotics).   Anticholinergics.   Anti-inflammatory agents.   Antacids that contain aluminum.  DISEASES THAT CONTRIBUTE TO CONSTIPATION  Diabetes.   Parkinson's disease.   Dementia.   Stroke.   Depression.   Illnesses that cause problems with salt and water metabolism.  HOME CARE INSTRUCTIONS   Constipation is usually best cared for without medicines. Increasing dietary fiber and eating more fruits and vegetables is the best way to manage constipation.   Slowly increase fiber intake to 25 to 38 grams per day. Whole grains, fruits, vegetables, and legumes are good sources of fiber. A dietitian can further help you incorporate high-fiber foods into your diet.   Drink enough water and fluids to keep your urine clear or pale yellow.   A fiber supplement may be added to  your diet if you cannot get enough fiber from foods.   Increasing your activities also helps improve regularity.   Suppositories, as suggested by your caregiver, will also help. If you are using antacids, such as aluminum or calcium containing products, it will be helpful to switch to products containing magnesium if your caregiver says it is okay.   If you have been given a liquid injection (enema) today, this is only a temporary measure. It should not be relied on for treatment of longstanding (chronic) constipation.   Stronger measures, such as magnesium sulfate, should be avoided if possible. This may cause uncontrollable diarrhea. Using magnesium sulfate may not allow you time to make it to the bathroom.  SEEK IMMEDIATE MEDICAL CARE IF:   There is bright red blood in the stool.   The constipation stays for more than 4 days.   There is belly (abdominal) or rectal pain.   You do not seem to be getting better.   You have any questions or concerns.  MAKE SURE YOU:   Understand these instructions.   Will watch your condition.   Will get help right away if you are not doing well or get worse.  Document Released: 10/03/2003 Document Revised: 12/24/2010 Document Reviewed: 12/08/2010 [facility] Patient Information 2012 [place], [place].

## 2011-04-01 NOTE — ED Notes (Signed)
C/o constipation, last BM 2 weeks ago. States has tried maalox a few times, then drank "half a bottle of laxative last night" without relief. Emesis x 1 PTA. C/o pain to rectal area.

## 2011-04-01 NOTE — ED Provider Notes (Signed)
History     CSN: [id number]  Arrival date & time 04/01/11  [id number]   First MD Initiated Contact with Patient 04/01/11 0818      Chief Complaint  Patient presents with  . Constipation    (Consider location/radiation/quality/duration/timing/severity/associated sxs/prior treatment) Patient is a 31 y.o. female presenting with constipation. The history is provided by the patient.  Constipation   She is 27 weeks 4 days pregnant and has not had a bowel movement in the last 2 weeks. She has had nausea and has vomited several times. Most recent episode of emesis was just prior to coming to the emergency department. She denies abdominal pain or cramping. She says it feels like there is stool in her rectum but she just cannot get it out. She did try drinking a half bottle of a laxative without benefit. She denies fever, chills, sweats. [facility] and she is high risk because of diabetes. She was recently seen at the [facility] and given a prescription for hydrocodone for a toothache, but she has not started taking it and she denies taking any narcotics prior to developing her constipation.  Past Medical History  Diagnosis Date  . Asthma   . Diabetes mellitus   . Mental disorder   . Depression   . Anxiety   . Hypertension   . Hyperemesis arising during pregnancy     Past Surgical History  Procedure Date  . No past surgeries     Family History  Problem Relation Age of Onset  . Heart disease Mother     CHF  . Anesthesia problems Neg Hx   . Diabetes Sister     History  Substance Use Topics  . Smoking status: Never Smoker   . Smokeless tobacco: Never Used  . Alcohol Use: No     not with preg    OB History    Grav Para Term Preterm Abortions TAB SAB Ect Mult Living   4 2 1 1 1  0 0 1 0 2      Review of Systems  Gastrointestinal: Positive for constipation.  All other systems reviewed and are negative.    Allergies  Dilaudid and Morphine and  related  Home Medications   Current Outpatient Rx  Name Route Sig Dispense Refill  . ACETAMINOPHEN 500 MG PO TABS Oral Take 500 mg by mouth every 6 (six) hours as needed. For pain    . ALBUTEROL SULFATE HFA 108 (90 BASE) MCG/ACT IN AERS Inhalation Inhale 2 puffs into the lungs every 6 (six) hours as needed. For shortness of breath    . ESCITALOPRAM OXALATE 20 MG PO TABS Oral Take 1 tablet (20 mg total) by mouth daily. 30 tablet 3  . HYDROCODONE-ACETAMINOPHEN 5-325 MG PO TABS Oral Take 1 tablet by mouth every 6 (six) hours as needed. For pain    . INSULIN ASPART 100 UNIT/ML [place] SOLN Subcutaneous Inject 10 Units into the skin 3 (three) times daily before meals. 10 mL 2  . INSULIN ISOPHANE HUMAN 100 UNIT/ML [place] SUSP Subcutaneous Inject 5 Units into the skin 2 (two) times daily. 5 units SQ 10 am and 10 pm.    . OMEPRAZOLE 40 MG PO CPDR Oral Take 1 capsule (40 mg total) by mouth daily. 30 capsule 5  . OVER THE COUNTER MEDICATION Oral Take 2 tablets by mouth daily. Takes flinstone vitamins    . PROMETHAZINE HCL 25 MG RE SUPP Rectal Place 25 mg rectally  every 6 (six) hours as needed. For nausea      BP 125/94  Pulse 110  Temp(Src) 98 F (36.7 C) (Oral)  Resp 16  Ht 5\' 6"  (1.676 m)  Wt 175 lb (79.379 kg)  BMI 28.25 kg/m2  SpO2 100%  LMP 09/19/2010  Physical Exam  Nursing note and vitals reviewed.  31 year old female who is resting comfortably and in no acute distress. Vital signs are significant for mild tachycardia with heart rate 118. Oxygen saturation is 97% which is normal. Head is normocephalic and atraumatic. PERRLA, EOMI. Oropharynx is clear. Neck is nontender and supple without adenopathy. Back is nontender there's no CVA tenderness. Lungs are clear without rales, wheezes, or rhonchi. Heart has regular rate and rhythm without murmur. Abdomen is soft, and nontender without masses. Fundus is gravid and appropriate to stage of pregnancy. Rectal exam shows significant tenderness and a large  impaction which is only mildly hard. Extremities have no cyanosis or edema. Skin is warm and dry without rash. Neurologic: Mental status is normal, cranial nerves are intact, there no focal motor or sensory deficits.  ED Course  Procedures (including critical care time)  She was given Zofran ODT, and then given by scrotal and magnesium citrate. She'll be sent home with a prescription for GoLYTELY to use if the above-noted medications do not give her adequate relief.  1. Constipation   2. Pregnancy as incidental finding       MDM  Constipation as a complication of pregnancy. She will be given an antiemetic and a laxative.        Delora Fuel, MD [id number] [id number]

## 2011-04-02 ENCOUNTER — Encounter: Payer: Self-pay | Admitting: Obstetrics and Gynecology

## 2011-04-02 NOTE — Progress Notes (Unsigned)
Patient not seen for prenatal appointment since 03/11/2011. She no showed for 2 scheduled appointments in march. Home care IV hydration order with phenergan was ordered week of 2/25 but was never initiated. Home care contacted today and order was replaced since the patient has had multiple ED visits for emesis since her last discharge on 03/12/2011. This order is for twice daily IV hydration with phenergan and is due to expire on April 1st.

## 2011-04-07 ENCOUNTER — Inpatient Hospital Stay (HOSPITAL_COMMUNITY)
Admission: AD | Admit: 2011-04-07 | Discharge: 2011-04-07 | Disposition: A | Discharge status: Home / Self Care | Payer: Medicaid Other | Source: Ambulatory Visit | Attending: Obstetrics and Gynecology | Admitting: Obstetrics and Gynecology

## 2011-04-07 ENCOUNTER — Encounter (HOSPITAL_COMMUNITY): Payer: Self-pay | Admitting: *Deleted

## 2011-04-07 DIAGNOSIS — O212 Late vomiting of pregnancy: Secondary | ICD-10-CM | POA: Insufficient documentation

## 2011-04-07 DIAGNOSIS — O211 Hyperemesis gravidarum with metabolic disturbance: Secondary | ICD-10-CM

## 2011-04-07 LAB — COMPREHENSIVE METABOLIC PANEL
ALT: 6 U/L (ref 0–35)
Albumin: 2.4 g/dL — ABNORMAL LOW (ref 3.5–5.2)
Alkaline Phosphatase: 60 U/L (ref 39–117)
GFR calc Af Amer: 76 mL/min — ABNORMAL LOW (ref >90)
Glucose, Bld: 111 mg/dL — ABNORMAL HIGH (ref 70–99)
Potassium: 4.3 mEq/L (ref 3.5–5.1)
Sodium: 137 mEq/L (ref 135–145)
Total Protein: 6.8 g/dL (ref 6.0–8.3)

## 2011-04-07 LAB — CBC
HCT: 24.6 % — ABNORMAL LOW (ref 36.0–46.0)
MCH: 25.7 pg — ABNORMAL LOW (ref 26.0–34.0)
MCHC: 32.1 g/dL (ref 30.0–36.0)
MCV: 80.1 fL (ref 78.0–100.0)
RDW: 13.8 % (ref 11.5–15.5)

## 2011-04-07 LAB — URINALYSIS, ROUTINE W REFLEX MICROSCOPIC
Bilirubin Urine: NEGATIVE
Glucose, UA: NEGATIVE mg/dL
Hgb urine dipstick: NEGATIVE
Ketones, ur: NEGATIVE mg/dL
pH: 8 (ref 5.0–8.0)

## 2011-04-07 LAB — URINE MICROSCOPIC-ADD ON

## 2011-04-07 MED ORDER — LACTATED RINGERS IV BOLUS (SEPSIS)
1000.0000 mL | Freq: Once | INTRAVENOUS | Status: AC
  Administered 2011-04-07: 1000 mL via INTRAVENOUS

## 2011-04-07 MED ORDER — SODIUM CHLORIDE 0.9 % IJ SOLN
INTRAMUSCULAR | Status: AC
  Filled 2011-04-07: qty 3

## 2011-04-07 MED ORDER — HYDROMORPHONE HCL PF 1 MG/ML IJ SOLN
1.0000 mg | Freq: Once | INTRAMUSCULAR | Status: DC
  Filled 2011-04-07: qty 1

## 2011-04-07 MED ORDER — HYDROMORPHONE HCL PF 1 MG/ML IJ SOLN
1.0000 mg | Freq: Once | INTRAMUSCULAR | Status: AC
Start: 2011-04-07 — End: 2011-04-07
  Administered 2011-04-07: 1 mg via INTRAMUSCULAR

## 2011-04-07 MED ORDER — LORAZEPAM 2 MG/ML IJ SOLN
1.0000 mg | Freq: Once | INTRAMUSCULAR | Status: DC
  Filled 2011-04-07: qty 1

## 2011-04-07 MED ORDER — PROMETHAZINE HCL 25 MG/ML IJ SOLN
25.0000 mg | Freq: Once | INTRAMUSCULAR | Status: AC
  Administered 2011-04-07: 25 mg via INTRAMUSCULAR
  Filled 2011-04-07: qty 1

## 2011-04-07 MED ORDER — PROMETHAZINE HCL 25 MG/ML IJ SOLN
25.0000 mg | Freq: Once | INTRAMUSCULAR | Status: DC
  Filled 2011-04-07: qty 1

## 2011-04-07 MED ORDER — LACTATED RINGERS IV SOLN
Freq: Once | INTRAVENOUS | Status: AC
  Administered 2011-04-07: 12:00:00 via INTRAVENOUS

## 2011-04-07 MED ORDER — LORAZEPAM 2 MG/ML IJ SOLN
1.0000 mg | Freq: Once | INTRAMUSCULAR | Status: AC
  Administered 2011-04-07: 1 mg via INTRAMUSCULAR

## 2011-04-07 NOTE — MAU Note (Signed)
IV nurse in room for IV start for  Home treatment

## 2011-04-07 NOTE — MAU Note (Signed)
3 attempts for IV/ Garner Gavel, RN, and Kerri Perches, RN, lab unable to obtain blood. CRNA called and will come as case ends/ pt had one additional 144ml emesis , tracing FHT off and on due to pt movement in the bed

## 2011-04-07 NOTE — MAU Note (Addendum)
Labs reviewed with Dr Barbra Sarks.  Plan to d/c pt after IV infused. Dr Elly Modena has home care set up - they will be see her in the morning to give IV fluids and  Phenergan.  Plan explained to pt by Dr Barbra Sarks.

## 2011-04-07 NOTE — MAU Note (Signed)
IV was flushed with NS with Liter infused.  No complaints of discomfort.  No redness or swelling at site.  IV to remain in place for home therapy.

## 2011-04-07 NOTE — MAU Provider Note (Signed)
While administering IV phenergan (diluted in 16ml of NACL) IV infiltrated, pharmacy notified

## 2011-04-07 NOTE — MAU Provider Note (Signed)
History     CSN: [id number]  Arrival date and time: 04/07/11 1002   First Provider Initiated Contact with Patient 04/07/11 1030      Chief Complaint  Patient presents with  . Emesis   HPI  Ms. [name] comes into MAU today for nausea, vomiting, and headaches.  She says the vomiting started two days ago, and was followed by the headache.  She has not been able to keep anything down for at least 24 hours.  She has not had vaginal bleeding or discharge, and reports good fetal movement.   OB History    Grav Para Term Preterm Abortions TAB SAB Ect Mult Living   4 2 1 1 1  0 0 1 0 2      Past Medical History  Diagnosis Date  . Asthma   . Diabetes mellitus   . Mental disorder   . Depression   . Anxiety   . Hypertension   . Hyperemesis arising during pregnancy     Past Surgical History  Procedure Date  . No past surgeries     Family History  Problem Relation Age of Onset  . Heart disease Mother     CHF  . Anesthesia problems Neg Hx   . Diabetes Sister     History  Substance Use Topics  . Smoking status: Never Smoker   . Smokeless tobacco: Never Used  . Alcohol Use: No     not with preg    Allergies:  Allergies  Allergen Reactions  . Dilaudid (Hydromorphone Hcl) Nausea And Vomiting  . Morphine And Related Nausea And Vomiting    Prescriptions prior to admission  Medication Sig Dispense Refill  . acetaminophen (TYLENOL) 500 MG tablet Take 500 mg by mouth every 6 (six) hours as needed. For pain      . albuterol (PROVENTIL HFA;VENTOLIN HFA) 108 (90 BASE) MCG/ACT inhaler Inhale 2 puffs into the lungs every 6 (six) hours as needed. For shortness of breath      . escitalopram (LEXAPRO) 20 MG tablet Take 1 tablet (20 mg total) by mouth daily.  30 tablet  3  . HYDROcodone-acetaminophen (NORCO) 5-325 MG per tablet Take 1 tablet by mouth every 6 (six) hours as needed. For pain      . insulin aspart (NOVOLOG) 100 UNIT/ML injection Inject 10 Units into the skin 3 (three)  times daily before meals.  10 mL  2  . insulin NPH (HUMULIN N,NOVOLIN N) 100 UNIT/ML injection Inject 5 Units into the skin 2 (two) times daily. 5 units SQ 10 am and 10 pm.      . omeprazole (PRILOSEC) 40 MG capsule Take 1 capsule (40 mg total) by mouth daily.  30 capsule  5  . OVER THE COUNTER MEDICATION Take 2 tablets by mouth daily. Takes flinstone vitamins      . promethazine (PHENERGAN) 25 MG suppository Place 25 mg rectally every 6 (six) hours as needed. For nausea        ROS Physical Exam   Last menstrual period 09/19/2010, SpO2 100.00%.  Physical Exam SpO2 100%  LMP 09/19/2010 General appearance: alert and mild distress Head: Normocephalic, without obvious abnormality, atraumatic Eyes: PERRL, EOMIT, no scleral icterus.  Throat: oral mucosa moist.  Neck: no adenopathy, no JVD, supple, symmetrical, trachea midline and thyroid not enlarged, symmetric, no tenderness/mass/nodules Lungs: clear to auscultation bilaterally Heart: regular rate and rhythm, S1, S2 normal, no murmur, click, rub or gallop Abdomen: gravid, non-tender.  Extremities: extremities normal, atraumatic, no  cyanosis or edema Pulses: 2+ and symmetric  FHT's: Baseline 150's, moderate variability, + accelerations, no decelerations.  Category I tracing.  Toco: No contractions.   MAU Course  Procedures  MDM Patient with recurrent nausea and vomiting.  Will obtain, UA.  This is generally unchanged from previous.  Will give LR bolus, phenergan, ativan, and dilaudid, then PO challenge.   Urinalysis WNL, no indication of dehydration.   Assessment and Plan  31 year old [id number] @ [redacted]w[redacted]d who re-presents to hospital with emesis.   - patients symptoms unchanged - IV obtained, patient will be discharged with IV - home health contacted, arranged for them to come starting tomorrow for BID IV fluids and phenergan.  - discharge home.     Ott Zimmerle 04/07/2011, 10:40 AM

## 2011-04-07 NOTE — MAU Note (Signed)
Ice chips given, per request.  Rails up.  States pain is easing off, more tired than anything.  Feeling drowsy.

## 2011-04-07 NOTE — MAU Note (Signed)
Small bruise noted on rt inner upper forearm.  No swelling or area or firmness or redness noted on palpation from infiltrate.

## 2011-04-07 NOTE — MAU Note (Addendum)
Dr. Barbra Sarks notified of infiltration of IV during administration of IV Phenergan, will change route of meds, also reported no labs were able to be obtained.  Will consult Dr. Kennon Rounds , this patient is not on home IV fluids, no port, no PICC in place 1230, Pharmacy recommends Ice to area of infiltration, ice pack applied, secured to arm with mesh cloth and tape

## 2011-04-07 NOTE — MAU Note (Signed)
Arrived per EMS, 122ml yellow emesis in container, complains of not feeling well, n/v x 2 days , c/o feet hurting  Pain shoots to her feed , h/a present x 2 days, feels like she is going to pass out, dizzy

## 2011-04-07 NOTE — Discharge Instructions (Signed)
Hyperemesis Gravidarum  Hyperemesis gravidarum is a severe form of nausea and vomiting that happens during pregnancy. Hyperemesis is worse than morning sickness. It may cause a woman to have nausea or vomiting all day for many days. It may keep a woman from eating and drinking enough food and liquids. Hyperemesis usually occurs during the first half (the first 20 weeks) of pregnancy. It often goes away once a woman is in her second half of pregnancy. However, sometimes hyperemesis continues through an entire pregnancy.   CAUSES   The cause of this condition is not completely known but is thought to be due to changes in the body's hormones when pregnant. It could be the high level of the pregnancy hormone or an increase in estrogen in the body.   SYMPTOMS    Severe nausea and vomiting.   Nausea that does not go away.   Vomiting that does not allow you to keep any food down.   Weight loss and body fluid loss (dehydration).   Having no desire to eat or not liking food you have previously enjoyed.  DIAGNOSIS   Your caregiver may ask you about your symptoms. Your caregiver may also order blood tests and urine tests to make sure something else is not causing the problem.   TREATMENT   You may only need medicine to control the problem. If medicines do not control the nausea and vomiting, you will be treated in the hospital to prevent dehydration, acidosis, weight loss, and changes in the electrolytes in your body that may harm the unborn baby (fetus). You may need intravenous (IV) fluids.   HOME CARE INSTRUCTIONS    Take all medicine as directed by your caregiver.   Try eating a couple of dry crackers or toast in the morning before getting out of bed.   Avoid foods and smells that upset your stomach.   Avoid fatty and spicy foods. Eat 5 to 6 small meals a day.   Do not drink when eating meals. Drink between meals.   For snacks, eat high protein foods, such as cheese. Eat or suck on things that have ginger in  them. Ginger helps nausea.   Avoid food preparation. The smell of food can spoil your appetite.   Avoid iron pills and iron in your multivitamins until after 3 to 4 months of being pregnant.  SEEK MEDICAL CARE IF:    Your abdominal pain increases since the last time you saw your caregiver.   You have a severe headache.   You develop vision problems.   You feel you are losing weight.  SEEK IMMEDIATE MEDICAL CARE IF:    You are unable to keep fluids down.   You vomit blood.   You have constant nausea and vomiting.   You have a fever.   You have excessive weakness, dizziness, fainting, or extreme thirst.  MAKE SURE YOU:    Understand these instructions.   Will watch your condition.   Will get help right away if you are not doing well or get worse.  Document Released: 01/04/2005 Document Revised: 12/24/2010 Document Reviewed: 04/06/2010  [employer] Patient Information 2012 [employer].

## 2011-04-08 ENCOUNTER — Inpatient Hospital Stay (HOSPITAL_COMMUNITY)
Admission: AD | Admit: 2011-04-08 | Discharge: 2011-04-08 | Disposition: A | Discharge status: Home / Self Care | Payer: Medicaid Other | Source: Ambulatory Visit | Attending: Obstetrics and Gynecology | Admitting: Obstetrics and Gynecology

## 2011-04-08 ENCOUNTER — Encounter (HOSPITAL_COMMUNITY): Payer: Self-pay | Admitting: *Deleted

## 2011-04-08 DIAGNOSIS — O10019 Pre-existing essential hypertension complicating pregnancy, unspecified trimester: Secondary | ICD-10-CM | POA: Insufficient documentation

## 2011-04-08 DIAGNOSIS — O212 Late vomiting of pregnancy: Secondary | ICD-10-CM | POA: Insufficient documentation

## 2011-04-08 DIAGNOSIS — O24919 Unspecified diabetes mellitus in pregnancy, unspecified trimester: Secondary | ICD-10-CM | POA: Insufficient documentation

## 2011-04-08 DIAGNOSIS — O10919 Unspecified pre-existing hypertension complicating pregnancy, unspecified trimester: Secondary | ICD-10-CM

## 2011-04-08 DIAGNOSIS — E119 Type 2 diabetes mellitus without complications: Secondary | ICD-10-CM | POA: Insufficient documentation

## 2011-04-08 MED ORDER — GI COCKTAIL ~~LOC~~
30.0000 mL | Freq: Once | ORAL | Status: AC
  Administered 2011-04-08: 30 mL via ORAL
  Filled 2011-04-08: qty 30

## 2011-04-08 MED ORDER — CYCLOBENZAPRINE HCL 5 MG PO TABS: 5.0000 mg | ORAL_TABLET | Freq: Three times a day (TID) | ORAL | Status: AC | PRN

## 2011-04-08 MED ORDER — CYCLOBENZAPRINE HCL 10 MG PO TABS
5.0000 mg | ORAL_TABLET | Freq: Once | ORAL | Status: AC
  Administered 2011-04-08: 5 mg via ORAL
  Filled 2011-04-08 (×2): qty 2

## 2011-04-08 MED ORDER — SODIUM CHLORIDE 0.9 % IJ SOLN
INTRAMUSCULAR | Status: AC
  Filled 2011-04-08: qty 3

## 2011-04-08 MED ORDER — PROMETHAZINE HCL 25 MG/ML IJ SOLN
25.0000 mg | Freq: Once | INTRAVENOUS | Status: AC
  Administered 2011-04-08: 25 mg via INTRAVENOUS
  Filled 2011-04-08: qty 1

## 2011-04-08 NOTE — MAU Provider Note (Signed)
Agree with above note.  Cynthie Garmon 04/08/2011 9:01 AM

## 2011-04-08 NOTE — MAU Note (Signed)
Pt returns today for same symptoms as yesterday.  Was supposed to be seen by home health this morning, states they didn't show up.  Home Health called saying that they had tried to get in touch with pt, but was told pt already on way to hospital.  Pt denies any bleeding or lof.  + FM.

## 2011-04-08 NOTE — MAU Provider Note (Signed)
History     CSN: [id number]  Arrival date and time: 04/08/11 1125   First Provider Initiated Contact with Patient 04/08/11 1151      Chief Complaint  Patient presents with  . Hyperemesis Gravidarum   HPI  [name] is a 31 y.o. [id number] who presents at [redacted]w[redacted]d with nausea, vomiting and diffuse pain. 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 Her emesis has been clear, too numerous to count overnight. Mild epigastric discomfort. No contractions. She was scheduled to have home health give her IVF and phenergan twice a day, but she states she was contacted after she was already here. Diffuse pain as well, mostly in lower back. Some tingling in anterior calves, which shoots from her knees to her toes. No posterior pain or swelling. +fetal movement. No vaginal bleeding or discharge. BS have been 97-117 recently.   Past Medical History  Diagnosis Date  . Asthma   . Diabetes mellitus   . Mental disorder   . Depression   . Anxiety   . Hypertension   . Hyperemesis arising during pregnancy     Past Surgical History  Procedure Date  . No past surgeries     Family History  Problem Relation Age of Onset  . Heart disease Mother     CHF  . Anesthesia problems Neg Hx   . Diabetes Sister     History  Substance Use Topics  . Smoking status: Never Smoker   . Smokeless tobacco: Never Used  . Alcohol Use: No     not with preg    Allergies:  Allergies  Allergen Reactions  . Dilaudid (Hydromorphone Hcl) Nausea And Vomiting  . Morphine And Related Nausea And Vomiting    Prescriptions prior to admission  Medication Sig Dispense Refill  . albuterol (PROVENTIL HFA;VENTOLIN HFA) 108 (90 BASE) MCG/ACT inhaler Inhale 2 puffs into the lungs every 6 (six) hours as needed. For shortness of breath      . escitalopram (LEXAPRO) 20 MG tablet Take 1 tablet (20 mg total) by mouth daily.   30 tablet  3  . flintstones complete (FLINTSTONES) 60 MG chewable tablet Chew 2 tablets by mouth daily.      Marland Kitchen HYDROcodone-acetaminophen (NORCO) 5-325 MG per tablet Take 1 tablet by mouth every 6 (six) hours as needed. For pain      . insulin aspart (NOVOLOG) 100 UNIT/ML injection Inject 10 Units into the skin 3 (three) times daily before meals.  10 mL  2  . insulin NPH (HUMULIN N,NOVOLIN N) 100 UNIT/ML injection Inject 5 Units into the skin 2 (two) times daily. 5 units SQ 10 am and 10 pm.      . omeprazole (PRILOSEC) 40 MG capsule Take 1 capsule (40 mg total) by mouth daily.  30 capsule  5  . promethazine (PHENERGAN) 25 MG suppository Place 25 mg rectally every 6 (six) hours as needed. For nausea        Review of Systems  Constitutional: Negative for fever.  Eyes: Negative for blurred vision, double vision and photophobia.  Gastrointestinal: Positive for nausea, vomiting, abdominal pain (epigastric discomfort) and constipation (last stool one week ago). Negative for diarrhea.  Neurological: Positive for headaches.   Physical Exam   Last menstrual period 09/19/2010.  Physical Exam  Constitutional: She is oriented to person, place, and time. She appears well-developed and well-nourished. No distress.  HENT:  Head: Normocephalic and atraumatic.  Neck: Normal range of motion.  Respiratory: Effort normal.  GI: She exhibits distension (gravid).  Musculoskeletal: Normal range of motion. She exhibits no edema and no tenderness.  Neurological: She is alert and oriented to person, place, and time.  Skin: Skin is warm and dry. She is not diaphoretic. No erythema.  Psychiatric: Her mood appears anxious.    MAU Course  Procedures  @1200 : LR and Phenergan running. Flexeril given for back pain  @1300  Flexeril taken by patient No episodes of emesis Starting PO trial  @1400  No episodes of emesis Patient stable for d/c  Assessment and Plan   31 y.o. [id number] who presents at [redacted]w[redacted]d  IUP Class C DM PreGHTN Back Pain  - likely musculoskeletal  - no warning s/sx  - rx for flexeril Hyperemesis Gravidarum  - discharge home  - home health nursing is set up for IVF and phenergan twice a day  - emphasized importance of [place]  - patient symptoms generally unchanged from prior  - no episodes of emesis here  Case discussed with Dr. Nunzio Cory, Gaylene Brooks 04/08/2011, 11:54 AM

## 2011-04-08 NOTE — Discharge Instructions (Signed)
Hyperemesis Gravidarum  Hyperemesis gravidarum is a severe form of nausea and vomiting that happens during pregnancy. Hyperemesis is worse than morning sickness. It may cause a woman to have nausea or vomiting all day for many days. It may keep a woman from eating and drinking enough food and liquids. Hyperemesis usually occurs during the first half (the first 20 weeks) of pregnancy. It often goes away once a woman is in her second half of pregnancy. However, sometimes hyperemesis continues through an entire pregnancy.   CAUSES   The cause of this condition is not completely known but is thought to be due to changes in the body's hormones when pregnant. It could be the high level of the pregnancy hormone or an increase in estrogen in the body.   SYMPTOMS    Severe nausea and vomiting.   Nausea that does not go away.   Vomiting that does not allow you to keep any food down.   Weight loss and body fluid loss (dehydration).   Having no desire to eat or not liking food you have previously enjoyed.  DIAGNOSIS   Your caregiver may ask you about your symptoms. Your caregiver may also order blood tests and urine tests to make sure something else is not causing the problem.   TREATMENT   You may only need medicine to control the problem. If medicines do not control the nausea and vomiting, you will be treated in the hospital to prevent dehydration, acidosis, weight loss, and changes in the electrolytes in your body that may harm the unborn baby (fetus). You may need intravenous (IV) fluids.   HOME CARE INSTRUCTIONS    Take all medicine as directed by your caregiver.   Try eating a couple of dry crackers or toast in the morning before getting out of bed.   Avoid foods and smells that upset your stomach.   Avoid fatty and spicy foods. Eat 5 to 6 small meals a day.   Do not drink when eating meals. Drink between meals.   For snacks, eat high protein foods, such as cheese. Eat or suck on things that have ginger in  them. Ginger helps nausea.   Avoid food preparation. The smell of food can spoil your appetite.   Avoid iron pills and iron in your multivitamins until after 3 to 4 months of being pregnant.  SEEK MEDICAL CARE IF:    Your abdominal pain increases since the last time you saw your caregiver.   You have a severe headache.   You develop vision problems.   You feel you are losing weight.  SEEK IMMEDIATE MEDICAL CARE IF:    You are unable to keep fluids down.   You vomit blood.   You have constant nausea and vomiting.   You have a fever.   You have excessive weakness, dizziness, fainting, or extreme thirst.  MAKE SURE YOU:    Understand these instructions.   Will watch your condition.   Will get help right away if you are not doing well or get worse.  Document Released: 01/04/2005 Document Revised: 12/24/2010 Document Reviewed: 04/06/2010  [employer] Patient Information 2012 [employer].

## 2011-04-09 ENCOUNTER — Inpatient Hospital Stay (HOSPITAL_COMMUNITY)
Admission: AD | Admit: 2011-04-09 | Discharge: 2011-04-09 | Disposition: A | Discharge status: Home / Self Care | Payer: Medicaid Other | Source: Ambulatory Visit | Attending: Obstetrics and Gynecology | Admitting: Obstetrics and Gynecology

## 2011-04-09 DIAGNOSIS — O211 Hyperemesis gravidarum with metabolic disturbance: Secondary | ICD-10-CM

## 2011-04-09 DIAGNOSIS — F419 Anxiety disorder, unspecified: Secondary | ICD-10-CM

## 2011-04-09 DIAGNOSIS — O210 Mild hyperemesis gravidarum: Secondary | ICD-10-CM | POA: Insufficient documentation

## 2011-04-09 LAB — URINALYSIS, ROUTINE W REFLEX MICROSCOPIC
Leukocytes, UA: NEGATIVE
Nitrite: NEGATIVE
Specific Gravity, Urine: 1.015 (ref 1.005–1.030)
Urobilinogen, UA: 0.2 mg/dL (ref 0.0–1.0)
pH: 6.5 (ref 5.0–8.0)

## 2011-04-09 LAB — URINE MICROSCOPIC-ADD ON

## 2011-04-09 MED ORDER — SODIUM CHLORIDE 0.9 % IV SOLN
25.0000 mg | Freq: Once | INTRAVENOUS | Status: AC
  Administered 2011-04-09: 25 mg via INTRAVENOUS
  Filled 2011-04-09: qty 1

## 2011-04-09 MED ORDER — ALUM & MAG HYDROXIDE-SIMETH 200-200-20 MG/5ML PO SUSP
30.0000 mL | Freq: Once | ORAL | Status: AC
  Administered 2011-04-09: 30 mL via ORAL
  Filled 2011-04-09: qty 30

## 2011-04-09 MED ORDER — DEXTROSE 5 % IV BOLUS
500.0000 mL | Freq: Once | INTRAVENOUS | Status: AC
  Administered 2011-04-09: 500 mL via INTRAVENOUS
  Filled 2011-04-09: qty 500

## 2011-04-09 MED ORDER — HYDROXYZINE HCL 25 MG PO TABS: 25.0000 mg | ORAL_TABLET | Freq: Two times a day (BID) | ORAL | Status: AC | PRN

## 2011-04-09 MED ORDER — SODIUM CHLORIDE 0.9 % IJ SOLN
INTRAMUSCULAR | Status: AC
  Filled 2011-04-09: qty 3

## 2011-04-09 MED ORDER — HYDROXYZINE HCL 50 MG/ML IM SOLN
25.0000 mg | Freq: Four times a day (QID) | INTRAMUSCULAR | Status: DC | PRN
  Administered 2011-04-09: 100 mg via INTRAMUSCULAR
  Filled 2011-04-09: qty 0.5

## 2011-04-09 MED ORDER — GI COCKTAIL ~~LOC~~
30.0000 mL | Freq: Once | ORAL | Status: AC
  Administered 2011-04-09: 30 mL via ORAL
  Filled 2011-04-09: qty 30

## 2011-04-09 MED ORDER — HYDROXYZINE HCL 50 MG/ML IM SOLN
25.0000 mg | Freq: Four times a day (QID) | INTRAMUSCULAR | Status: DC | PRN
  Filled 2011-04-09: qty 0.5

## 2011-04-09 NOTE — Discharge Instructions (Signed)

## 2011-04-09 NOTE — MAU Note (Signed)
Dr. Nehemiah Settle in to see pt.

## 2011-04-09 NOTE — MAU Note (Signed)
Pt requested to remove U/S and toco monitors.  Pt resting.

## 2011-04-09 NOTE — MAU Note (Signed)
Pt positioning herself for comfort, difficulty maintaining FHR tracing . Dr Nehemiah Settle made aware

## 2011-04-09 NOTE — MAU Note (Addendum)
Pt sleeping in lobby when Department Director arrived,  Offered taxi transportation, [place] stated  her ride is coming. Return to lobby minutes later to offer again the taxi and she states her ride is almost here.  I spoke to [employer], [place] in [facility] clinic and she will call pt today and set up appointment next week and discusss in rounds again and the social worker.  This note written by Garner Gavel, RN DD, under [facility] [place] log in open at the desk

## 2011-04-09 NOTE — MAU Provider Note (Addendum)
History     CSN: [id number]  Arrival date and time: 04/09/11 0110   None     No chief complaint on file.  HPI This is a 31 yo [id number] at 28.6 weeks seen in MAU for hyperemesis and anxiety.  Her anxiety has been worsening over the past 3 days.  No palliating or provoking factors.  Does have some heart burn.  GI cocktail in past has been helpful.  Denies vaginal bleeding, vaginal discharge, decreased fetal activity.  OB History    Grav Para Term Preterm Abortions TAB SAB Ect Mult Living   4 2 1 1 1  0 0 1 0 2      Past Medical History  Diagnosis Date  . Asthma   . Diabetes mellitus   . Mental disorder   . Depression   . Anxiety   . Hypertension   . Hyperemesis arising during pregnancy     Past Surgical History  Procedure Date  . No past surgeries     Family History  Problem Relation Age of Onset  . Heart disease Mother     CHF  . Anesthesia problems Neg Hx   . Diabetes Sister     History  Substance Use Topics  . Smoking status: Never Smoker   . Smokeless tobacco: Never Used  . Alcohol Use: No     not with preg    Allergies:  Allergies  Allergen Reactions  . Dilaudid (Hydromorphone Hcl) Nausea And Vomiting  . Morphine And Related Nausea And Vomiting    Prescriptions prior to admission  Medication Sig Dispense Refill  . albuterol (PROVENTIL HFA;VENTOLIN HFA) 108 (90 BASE) MCG/ACT inhaler Inhale 2 puffs into the lungs every 6 (six) hours as needed. For shortness of breath      . cyclobenzaprine (FLEXERIL) 5 MG tablet Take 1 tablet (5 mg total) by mouth 3 (three) times daily as needed for muscle spasms (or back pain).  20 tablet  0  . escitalopram (LEXAPRO) 20 MG tablet Take 1 tablet (20 mg total) by mouth daily.  30 tablet  3  . flintstones complete (FLINTSTONES) 60 MG chewable tablet Chew 2 tablets by mouth daily.      . insulin aspart (NOVOLOG) 100 UNIT/ML injection Inject 10 Units into the skin 3 (three) times daily before meals.  10 mL  2  . insulin  NPH (HUMULIN N,NOVOLIN N) 100 UNIT/ML injection Inject 5 Units into the skin 2 (two) times daily. 5 units SQ 10 am and 10 pm.      . omeprazole (PRILOSEC) 40 MG capsule Take 1 capsule (40 mg total) by mouth daily.  30 capsule  5  . promethazine (PHENERGAN) 25 MG suppository Place 25 mg rectally every 6 (six) hours as needed. For nausea        ROS Physical Exam   Blood pressure 115/78, pulse 119, temperature 97.4 F (36.3 C), temperature source Oral, resp. rate 20, last menstrual period 09/19/2010, SpO2 100.00%.  Physical Exam  Constitutional: She is oriented to person, place, and time. She appears well-developed and well-nourished.  Cardiovascular: Normal rate.   Respiratory: Effort normal and breath sounds normal.  GI: Soft. She exhibits no distension and no mass. There is no tenderness. There is no rebound and no guarding.  Musculoskeletal: She exhibits no edema and no tenderness.  Neurological: She is alert and oriented to person, place, and time.  Skin: Skin is warm and dry. No erythema.  Psychiatric: She has a normal mood and affect.  Her behavior is normal. Judgment and thought content normal.   NST: category 1 tracing with baseline of 155 MAU Course  Procedures  MDM Patient given 1.5 L of IVF with phenergan 25mg  and vistaril for anxiety.  Patient ready for discharge to home.  Will give prescription for vistaril to take when getting IVF.  Assessment and Plan  1.  IUP 28.6 weeks 2.  Hyperemesis gravidum 3.  Anxiety  Patient to home.  Patient needs to follow up in clinic on Monday.  [name] JEHIEL 04/09/2011, 2:01 AM

## 2011-04-10 NOTE — MAU Provider Note (Signed)
Agree with above note.  [name] 04/10/2011 6:53 AM

## 2011-04-11 ENCOUNTER — Encounter (HOSPITAL_COMMUNITY): Payer: Self-pay | Admitting: *Deleted

## 2011-04-11 ENCOUNTER — Inpatient Hospital Stay (HOSPITAL_COMMUNITY)
Admission: AD | Admit: 2011-04-11 | Discharge: 2011-04-11 | Disposition: A | Discharge status: Hospice | Payer: Medicaid Other | Source: Ambulatory Visit | Attending: Obstetrics & Gynecology | Admitting: Obstetrics & Gynecology

## 2011-04-11 DIAGNOSIS — O212 Late vomiting of pregnancy: Secondary | ICD-10-CM | POA: Insufficient documentation

## 2011-04-11 DIAGNOSIS — K5900 Constipation, unspecified: Secondary | ICD-10-CM | POA: Insufficient documentation

## 2011-04-11 DIAGNOSIS — O211 Hyperemesis gravidarum with metabolic disturbance: Secondary | ICD-10-CM

## 2011-04-11 DIAGNOSIS — O99891 Other specified diseases and conditions complicating pregnancy: Secondary | ICD-10-CM | POA: Insufficient documentation

## 2011-04-11 DIAGNOSIS — R109 Unspecified abdominal pain: Secondary | ICD-10-CM | POA: Insufficient documentation

## 2011-04-11 LAB — COMPREHENSIVE METABOLIC PANEL
CO2: 20 mEq/L (ref 19–32)
Calcium: 8.5 mg/dL (ref 8.4–10.5)
Creatinine, Ser: 1.28 mg/dL — ABNORMAL HIGH (ref 0.50–1.10)
GFR calc Af Amer: 63 mL/min — ABNORMAL LOW (ref >90)
GFR calc non Af Amer: 54 mL/min — ABNORMAL LOW (ref >90)
Glucose, Bld: 95 mg/dL (ref 70–99)
Sodium: 135 mEq/L (ref 135–145)
Total Protein: 5.8 g/dL — ABNORMAL LOW (ref 6.0–8.3)

## 2011-04-11 LAB — CBC
Hemoglobin: 7.8 g/dL — ABNORMAL LOW (ref 12.0–15.0)
MCH: 24.9 pg — ABNORMAL LOW (ref 26.0–34.0)
MCHC: 31.8 g/dL (ref 30.0–36.0)
MCV: 78.3 fL (ref 78.0–100.0)
Platelets: 257 10*3/uL (ref 150–400)
RBC: 3.13 MIL/uL — ABNORMAL LOW (ref 3.87–5.11)

## 2011-04-11 MED ORDER — HYDROMORPHONE HCL PF 1 MG/ML IJ SOLN
1.0000 mg | Freq: Once | INTRAMUSCULAR | Status: AC
  Administered 2011-04-11: 1 mg via INTRAVENOUS
  Filled 2011-04-11: qty 1

## 2011-04-11 MED ORDER — LORAZEPAM 2 MG/ML IJ SOLN
1.0000 mg | Freq: Once | INTRAMUSCULAR | Status: AC
  Administered 2011-04-11: 1 mg via INTRAVENOUS
  Filled 2011-04-11: qty 1

## 2011-04-11 MED ORDER — LACTATED RINGERS IV BOLUS (SEPSIS)
1000.0000 mL | Freq: Once | INTRAVENOUS | Status: AC
  Administered 2011-04-11: 1000 mL via INTRAVENOUS

## 2011-04-11 MED ORDER — GI COCKTAIL ~~LOC~~
30.0000 mL | Freq: Once | ORAL | Status: AC
  Administered 2011-04-11: 30 mL via ORAL
  Filled 2011-04-11: qty 30

## 2011-04-11 MED ORDER — PROMETHAZINE HCL 25 MG/ML IJ SOLN
25.0000 mg | Freq: Four times a day (QID) | INTRAMUSCULAR | Status: DC | PRN
  Administered 2011-04-11: 25 mg via INTRAVENOUS
  Filled 2011-04-11: qty 1

## 2011-04-11 NOTE — MAU Provider Note (Signed)
History     CSN: [id number]  Arrival date and time: 04/11/11 1850   First Provider Initiated Contact with Patient 04/11/11 1928      No chief complaint on file.  HPI  31 year old [id number] at [redacted]w[redacted]d who presents again to MAU with nausea/vomiting/abdominal pain.  She also says she has not had a bowel movement in several days.  She has had multiple admissions and ER/MAU visits for hyperemesis this pregnancy.  She again says today she feels like she is dying.  Her mother is present and says that they need to get this baby out.    The patient has had home health ordered to come and give her a liter bolus and IV phenergan twice a day.  Home health reported on 3/22 when our team contacted them that they are having difficulty getting a hold of Ms. Teters, that she is never home when the come to her house or call.    The patient tells me home health has only come once, and they told her they would not come on the weekends.   OB History    Grav Para Term Preterm Abortions TAB SAB Ect Mult Living   4 2 1 1 1  0 0 1 0 2      Past Medical History  Diagnosis Date  . Asthma   . Diabetes mellitus   . Mental disorder   . Depression   . Anxiety   . Hypertension   . Hyperemesis arising during pregnancy     Past Surgical History  Procedure Date  . No past surgeries     Family History  Problem Relation Age of Onset  . Heart disease Mother     CHF  . Anesthesia problems Neg Hx   . Diabetes Sister     History  Substance Use Topics  . Smoking status: Never Smoker   . Smokeless tobacco: Never Used  . Alcohol Use: No     not with preg    Allergies:  Allergies  Allergen Reactions  . Dilaudid (Hydromorphone Hcl) Nausea And Vomiting  . Morphine And Related Nausea And Vomiting    Prescriptions prior to admission  Medication Sig Dispense Refill  . albuterol (PROVENTIL HFA;VENTOLIN HFA) 108 (90 BASE) MCG/ACT inhaler Inhale 2 puffs into the lungs every 6 (six) hours as needed. For  shortness of breath      . cyclobenzaprine (FLEXERIL) 5 MG tablet Take 1 tablet (5 mg total) by mouth 3 (three) times daily as needed for muscle spasms (or back pain).  20 tablet  0  . escitalopram (LEXAPRO) 20 MG tablet Take 1 tablet (20 mg total) by mouth daily.  30 tablet  3  . flintstones complete (FLINTSTONES) 60 MG chewable tablet Chew 2 tablets by mouth daily.      . hydrOXYzine (ATARAX/VISTARIL) 25 MG tablet Take 1-2 tablets (25-50 mg total) by mouth 2 (two) times daily as needed for anxiety.  12 tablet  0  . insulin aspart (NOVOLOG) 100 UNIT/ML injection Inject 10 Units into the skin 3 (three) times daily before meals.  10 mL  2  . insulin NPH (HUMULIN N,NOVOLIN N) 100 UNIT/ML injection Inject 5 Units into the skin 2 (two) times daily. 5 units SQ 10 am and 10 pm.      . omeprazole (PRILOSEC) 40 MG capsule Take 1 capsule (40 mg total) by mouth daily.  30 capsule  5  . promethazine (PHENERGAN) 25 MG suppository Place 25 mg rectally every  6 (six) hours as needed. For nausea        ROS: Patient is extremely uncomfortable and uncooperative with ROS Physical Exam   Blood pressure 115/72, pulse 114, temperature 98.2 F (36.8 C), temperature source Oral, resp. rate 20, last menstrual period 09/19/2010.  Physical Exam  Vitals reviewed. Constitutional: She appears well-developed and well-nourished. She appears distressed.  HENT:  Head: Normocephalic and atraumatic.  Mouth/Throat: Oropharynx is clear and moist.  Eyes: Pupils are equal, round, and reactive to light.  Neck: Normal range of motion. No JVD present.  Cardiovascular: Normal rate and regular rhythm.   Respiratory: Effort normal and breath sounds normal.  GI:       Gravid.  Musculoskeletal: She exhibits no edema.  Skin: Skin is warm. No rash noted.    MAU Course  Procedures  MDM Patient with chronic hyperemesis this pregnancy.  Will obtain labs, hydrate, give pain medications, antiemetics, anxiolytics, and after patient is  more comfortable give enema for constipation.   Assessment and Plan  31 year old [phone] [redacted]w[redacted]d with Hyperemesis who presents with nausea and vomiting, abdominal pain and constipation - Patient given fleets enema and had results - vomiting and pain improved with medications - s/p 1 L LR bolus - discharge home, continue home healt IVF BID and IV phenergan BID.   Galvin Aversa 04/11/2011, 8:10 PM

## 2011-04-11 NOTE — MAU Note (Signed)
Pt c/o low abd pain that is sharp and shooting that goes around to low back since yesterday.  C/o constipation, has not had BM in one week, also c/o burning and pressure in epigastric area.

## 2011-04-11 NOTE — Progress Notes (Signed)
Pt insists she is dying b/c of the low abd pain.  She was reassured that she is not dying.  Explained round ligament pain to pt and provided warm blankets .  Pt crying repeatedly saying don't leave me.  Pt was ecouraged and reassured that she would not be alone.  She  Then asked for her support person instead. MD notified.

## 2011-04-11 NOTE — Discharge Instructions (Signed)
Constipation Constipation is when you poop (have a bowel movement) less than 3 times a week. Sometimes the poop (stool) is small and hard. There are many different causes of constipation.  HOME CARE   Go to the bathroom when you feel the urge to go.   Drink enough fluid to keep your pee (urine) clear or pale yellow.   Eat more high-fiber foods.   Drink prune juice or eat stewed fruits in the morning.   Exercise.   Ask your doctor if you should take medicine to help you poop or take fiber supplements.  GET HELP RIGHT AWAY IF:   You see blood in your poop or in the toilet.   Your belly (abdomen) gets hard and puffy (swollen).   You keep throwing up (vomiting).   You have a lot of pain.  MAKE SURE YOU:   Understand these instructions.   Will watch your condition.   Will get help right away if you are not doing well or get worse.  Document Released: 06/23/2007 Document Revised: 12/24/2010 Document Reviewed: 12/08/2010 [facility] Patient Information 2012 [place], [place].

## 2011-04-11 NOTE — MAU Provider Note (Signed)
Attestation of Attending Supervision of Resident: Evaluation and management procedures were performed by the [facility] Medicine Resident under my supervision.  I have evaluated the patient, reviewed the resident's note and chart, and I agree with management and plan.   Verita Schneiders, M.D. 04/11/2011 11:31 PM

## 2011-04-12 ENCOUNTER — Encounter (HOSPITAL_COMMUNITY): Payer: Self-pay | Admitting: *Deleted

## 2011-04-12 ENCOUNTER — Observation Stay (HOSPITAL_COMMUNITY)
Admission: AD | Admit: 2011-04-12 | Discharge: 2011-04-13 | Disposition: A | Discharge status: Home Health | Payer: Medicaid Other | Source: Ambulatory Visit | Attending: Obstetrics & Gynecology | Admitting: Obstetrics & Gynecology

## 2011-04-12 ENCOUNTER — Ambulatory Visit (INDEPENDENT_AMBULATORY_CARE_PROVIDER_SITE_OTHER): Payer: Medicaid Other | Admitting: Obstetrics & Gynecology

## 2011-04-12 VITALS — BP 137/97 | Temp 97.1°F | Wt 174.7 lb

## 2011-04-12 DIAGNOSIS — E860 Dehydration: Secondary | ICD-10-CM | POA: Insufficient documentation

## 2011-04-12 DIAGNOSIS — E119 Type 2 diabetes mellitus without complications: Secondary | ICD-10-CM | POA: Insufficient documentation

## 2011-04-12 DIAGNOSIS — O24919 Unspecified diabetes mellitus in pregnancy, unspecified trimester: Secondary | ICD-10-CM

## 2011-04-12 DIAGNOSIS — R197 Diarrhea, unspecified: Secondary | ICD-10-CM | POA: Insufficient documentation

## 2011-04-12 DIAGNOSIS — O10019 Pre-existing essential hypertension complicating pregnancy, unspecified trimester: Secondary | ICD-10-CM | POA: Insufficient documentation

## 2011-04-12 DIAGNOSIS — O212 Late vomiting of pregnancy: Principal | ICD-10-CM | POA: Insufficient documentation

## 2011-04-12 DIAGNOSIS — O10919 Unspecified pre-existing hypertension complicating pregnancy, unspecified trimester: Secondary | ICD-10-CM

## 2011-04-12 DIAGNOSIS — O210 Mild hyperemesis gravidarum: Secondary | ICD-10-CM

## 2011-04-12 LAB — GLUCOSE, CAPILLARY
Glucose-Capillary: 120 mg/dL — ABNORMAL HIGH (ref 70–99)
Glucose-Capillary: 66 mg/dL — ABNORMAL LOW (ref 70–99)

## 2011-04-12 LAB — POCT URINALYSIS DIP (DEVICE)
Ketones, ur: 80 mg/dL — AB
Leukocytes, UA: NEGATIVE
Specific Gravity, Urine: >=1.03 (ref 1.005–1.030)

## 2011-04-12 LAB — BASIC METABOLIC PANEL
CO2: 24 mEq/L (ref 19–32)
Calcium: 8.6 mg/dL (ref 8.4–10.5)
Chloride: 97 mEq/L (ref 96–112)
Creatinine, Ser: 1.27 mg/dL — ABNORMAL HIGH (ref 0.50–1.10)
Glucose, Bld: 126 mg/dL — ABNORMAL HIGH (ref 70–99)

## 2011-04-12 LAB — CBC
HCT: 22.1 % — ABNORMAL LOW (ref 36.0–46.0)
MCH: 25.6 pg — ABNORMAL LOW (ref 26.0–34.0)
MCV: 78.6 fL (ref 78.0–100.0)
Platelets: 233 10*3/uL (ref 150–400)
RBC: 2.81 MIL/uL — ABNORMAL LOW (ref 3.87–5.11)
RDW: 13.6 % (ref 11.5–15.5)
WBC: 4.4 10*3/uL (ref 4.0–10.5)

## 2011-04-12 MED ORDER — MENTHOL 3 MG MT LOZG
1.0000 | LOZENGE | OROMUCOSAL | Status: DC | PRN
  Filled 2011-04-12: qty 9

## 2011-04-12 MED ORDER — ACETAMINOPHEN 325 MG PO TABS: 650.0000 mg | ORAL_TABLET | ORAL | Status: DC | PRN

## 2011-04-12 MED ORDER — GI COCKTAIL ~~LOC~~
30.0000 mL | ORAL | Status: AC
  Administered 2011-04-12: 30 mL via ORAL
  Filled 2011-04-12: qty 30

## 2011-04-12 MED ORDER — ALBUTEROL SULFATE HFA 108 (90 BASE) MCG/ACT IN AERS
2.0000 | INHALATION_SPRAY | Freq: Four times a day (QID) | RESPIRATORY_TRACT | Status: DC | PRN
  Filled 2011-04-12: qty 6.7

## 2011-04-12 MED ORDER — ZOLPIDEM TARTRATE 10 MG PO TABS: 10.0000 mg | ORAL_TABLET | Freq: Every evening | ORAL | Status: DC | PRN

## 2011-04-12 MED ORDER — GI COCKTAIL ~~LOC~~
30.0000 mL | ORAL | Status: DC
  Filled 2011-04-12: qty 30

## 2011-04-12 MED ORDER — LOPERAMIDE HCL 2 MG PO CAPS
4.0000 mg | ORAL_CAPSULE | ORAL | Status: AC
  Administered 2011-04-12: 4 mg via ORAL
  Filled 2011-04-12: qty 2

## 2011-04-12 MED ORDER — CALCIUM CARBONATE ANTACID 500 MG PO CHEW
400.0000 mg | CHEWABLE_TABLET | ORAL | Status: DC | PRN
  Administered 2011-04-12: 400 mg via ORAL
  Filled 2011-04-12: qty 2

## 2011-04-12 MED ORDER — LACTATED RINGERS IV SOLN
INTRAVENOUS | Status: DC
  Administered 2011-04-12 – 2011-04-13 (×4): via INTRAVENOUS

## 2011-04-12 MED ORDER — PRENATAL MULTIVITAMIN CH
1.0000 | ORAL_TABLET | Freq: Every day | ORAL | Status: DC
  Filled 2011-04-12: qty 1

## 2011-04-12 MED ORDER — DOCUSATE SODIUM 100 MG PO CAPS
100.0000 mg | ORAL_CAPSULE | Freq: Every day | ORAL | Status: DC
  Filled 2011-04-12: qty 1

## 2011-04-12 MED ORDER — PANTOPRAZOLE SODIUM 40 MG PO TBEC
40.0000 mg | DELAYED_RELEASE_TABLET | Freq: Every day | ORAL | Status: DC
  Administered 2011-04-12 – 2011-04-13 (×2): 40 mg via ORAL
  Filled 2011-04-12 (×3): qty 1

## 2011-04-12 MED ORDER — INSULIN ASPART 100 UNIT/ML ~~LOC~~ SOLN
0.0000 [IU] | SUBCUTANEOUS | Status: DC
  Administered 2011-04-12: 3 [IU] via SUBCUTANEOUS

## 2011-04-12 MED ORDER — CALCIUM CARBONATE ANTACID 500 MG PO CHEW: 2.0000 | CHEWABLE_TABLET | ORAL | Status: DC | PRN

## 2011-04-12 MED ORDER — PROMETHAZINE HCL 25 MG RE SUPP
25.0000 mg | Freq: Four times a day (QID) | RECTAL | Status: DC | PRN
  Administered 2011-04-13: 25 mg via RECTAL
  Filled 2011-04-12: qty 1

## 2011-04-12 MED ORDER — INSULIN ASPART 100 UNIT/ML ~~LOC~~ SOLN
0.0000 [IU] | Freq: Three times a day (TID) | SUBCUTANEOUS | Status: DC
  Administered 2011-04-13: 2 [IU] via SUBCUTANEOUS

## 2011-04-12 NOTE — Progress Notes (Signed)
Pt seen in MAU last night; reports have not had food in over a week; feels really tired and "bad"; +headache and pain in midsternum area with inspiration only.  Did not bring log book because she has not been home since MAU visit; BS 120; Pt also report has not had fetal echo or opth exam>will set these appt up again.

## 2011-04-12 NOTE — Patient Instructions (Signed)
Hyperemesis Gravidarum  Hyperemesis gravidarum is a severe form of nausea and vomiting that happens during pregnancy. Hyperemesis is worse than morning sickness. It may cause a woman to have nausea or vomiting all day for many days. It may keep a woman from eating and drinking enough food and liquids. Hyperemesis usually occurs during the first half (the first 20 weeks) of pregnancy. It often goes away once a woman is in her second half of pregnancy. However, sometimes hyperemesis continues through an entire pregnancy.   CAUSES   The cause of this condition is not completely known but is thought to be due to changes in the body's hormones when pregnant. It could be the high level of the pregnancy hormone or an increase in estrogen in the body.   SYMPTOMS    Severe nausea and vomiting.   Nausea that does not go away.   Vomiting that does not allow you to keep any food down.   Weight loss and body fluid loss (dehydration).   Having no desire to eat or not liking food you have previously enjoyed.  DIAGNOSIS   Your caregiver may ask you about your symptoms. Your caregiver may also order blood tests and urine tests to make sure something else is not causing the problem.   TREATMENT   You may only need medicine to control the problem. If medicines do not control the nausea and vomiting, you will be treated in the hospital to prevent dehydration, acidosis, weight loss, and changes in the electrolytes in your body that may harm the unborn baby (fetus). You may need intravenous (IV) fluids.   HOME CARE INSTRUCTIONS    Take all medicine as directed by your caregiver.   Try eating a couple of dry crackers or toast in the morning before getting out of bed.   Avoid foods and smells that upset your stomach.   Avoid fatty and spicy foods. Eat 5 to 6 small meals a day.   Do not drink when eating meals. Drink between meals.   For snacks, eat high protein foods, such as cheese. Eat or suck on things that have ginger in  them. Ginger helps nausea.   Avoid food preparation. The smell of food can spoil your appetite.   Avoid iron pills and iron in your multivitamins until after 3 to 4 months of being pregnant.  SEEK MEDICAL CARE IF:    Your abdominal pain increases since the last time you saw your caregiver.   You have a severe headache.   You develop vision problems.   You feel you are losing weight.  SEEK IMMEDIATE MEDICAL CARE IF:    You are unable to keep fluids down.   You vomit blood.   You have constant nausea and vomiting.   You have a fever.   You have excessive weakness, dizziness, fainting, or extreme thirst.  MAKE SURE YOU:    Understand these instructions.   Will watch your condition.   Will get help right away if you are not doing well or get worse.  Document Released: 01/04/2005 Document Revised: 12/24/2010 Document Reviewed: 04/06/2010  [employer] Patient Information 2012 [employer].

## 2011-04-12 NOTE — Progress Notes (Signed)
   CARE MANAGEMEMENT NOTE  Patient:  [name], [name]   Account Number:  [id number]  Date Initiated:  04/12/2011  Documentation initiated by:  Juliet Rude  Subjective/Objective Assessment:   n/v hyperemesis     Action/Plan:   Anticipated DC Date:  04/13/2011   Anticipated DC Plan:           Choice offered to / List presented to:             Status of service:   Medicare Important Message given?   (If response is "NO", the following Medicare IM given date fields will be blank) Date Medicare IM given:   Date Additional Medicare IM given:    Discharge Disposition:    Per UR Regulation:    If discussed at Long Length of Stay Meetings, dates discussed:    Comments:  04/12/11 1700 L. Arvella Nigh [facility] [id number]) - patient came in today with n/v and attempted to put feeding tube in patient. Nursing staff was unable to get feeding tube in at this time. MD ordered for patient to try food which she did tolerate so po food this afternoon. Patient has IV fluids. Patient has been an active patient with [facility]. Levora Dredge liason with [facility] [phone] that patient was in [place] went in to see patient and discuss discharge plan. No orders yet but patient undecided if she wants to use [facility] again. She is currently active with [facility]. Nurse Care Manager gave patient her business card and also a [facility] list of agencies in [place]. Instructed patient to have her RN on unit call [facility] when she chooses an agency. No MD orders for Home Health at this time but DC planning started in preparation if she needs any IV fluids at home upon discharge. Please call nurse care manager with any needs. On Tuesday call: Idell Pickles [phone]

## 2011-04-12 NOTE — H&P (Signed)
[name] is a 31 y.o. female presenting for n/v.  She is admitted for observation directly to antepartum from [facility] for NG tube and tube feeding today.  See note by Dr Roselie Awkward.  Maternal Medical History:  Reason for admission: Reason for admission: nausea.  Fetal activity: Perceived fetal activity is normal.   Last perceived fetal movement was within the past hour.    Prenatal complications: Hyperemesis   Prenatal Complications - Diabetes: type 2. Diabetes is managed by insulin injections.      OB History    Grav Para Term Preterm Abortions TAB SAB Ect Mult Living   4 2 1 1 1  0 0 1 0 2     Past Medical History  Diagnosis Date  . Asthma   . Diabetes mellitus   . Mental disorder   . Depression   . Anxiety   . Hypertension   . Hyperemesis arising during pregnancy    Past Surgical History  Procedure Date  . No past surgeries    Family History: family history includes Diabetes in her sister and Heart disease in her mother.  There is no history of Anesthesia problems. Social History:  reports that she has never smoked. She has never used smokeless tobacco. She reports that she does not drink alcohol or use illicit drugs.  Review of Systems  Constitutional: Negative for fever, chills and malaise/fatigue.  Eyes: Negative for blurred vision.  Respiratory: Negative for cough and shortness of breath.   Cardiovascular: Negative for chest pain.  Gastrointestinal: Positive for nausea and vomiting. Negative for heartburn.  Genitourinary: Negative for dysuria, urgency and frequency.  Musculoskeletal: Negative.   Neurological: Negative for dizziness and headaches.  Psychiatric/Behavioral: Negative for depression.      Last menstrual period 09/19/2010. Maternal Exam:  Abdomen: Patient reports no abdominal tenderness.   Fetal Exam Fetal Monitor Review: Mode: ultrasound.    Fetal State Assessment: Category I - tracings are normal.     Physical Exam  Nursing note and  vitals reviewed. Constitutional: She is oriented to person, place, and time. She appears well-developed and well-nourished.  Neck: Normal range of motion.  Cardiovascular: Normal rate, regular rhythm and normal heart sounds.   Respiratory: Effort normal and breath sounds normal.  GI: Soft. Bowel sounds are normal.  Musculoskeletal: Normal range of motion.  Neurological: She is alert and oriented to person, place, and time.  Skin: Skin is warm and dry.  Psychiatric: She has a normal mood and affect. Her behavior is normal. Judgment and thought content normal.    Prenatal labs: ABO, Rh: A/POS/-- (11/20 0845) Antibody: NEG (11/20 0845) Rubella: 5.5 (11/20 0845) RPR: NON REAC (11/20 0845)  HBsAg: NEGATIVE (11/20 0845)  HIV: NON REACTIVE (11/20 0845)  GBS:   Unknown  Assessment/Plan: Admit to antepartum NG tube placement and initiation of tube feeding D/C home with f/u plan with home health    LEFTWICH-KIRBY, [name] 04/12/2011, 10:38 AM

## 2011-04-12 NOTE — Progress Notes (Signed)
Many MAU and ER visits including last night. States she hasn't eaten or tolerated fluids, in 1 wk. Generally not keeping down her meds. She has indigestion. She doesn't have her BS record but she states they are good. Willing to try tube feeding. Wants to be hospitalized for now. Has only seen home health once.

## 2011-04-12 NOTE — Progress Notes (Signed)
Admitted at  [redacted] weeks gestation, with Hyperemesis.  Height  66" Weight 174 Lbs pre-pregnancy weight 181 Lbs.Pre-pregnancy  BMI 29.2  IBW 130 Lbs  Total weight gain none. Is 96 % of usual weight. Weight gain goals 15-25 lbs.   Estimated needs: 22-2400 kcal/day, 90-100 grams protein/day, 2.4 liters fluid/day Gestational Carbohydrate modified diet. Pt reports no po intake for 7 days and was admitted for initiation of tube feedings. Pt has has had  tube feedings earlier in this pregnancy. NG tube placement failed today. Pt is willing to try a PO diet now.  Current diet prescription will provide for increased needs. However, if she is unable to tolerate a po diet and tube feedings are desired, Order: #8 french feeding tube to be placed ng Initiate Jevity 1.2 at 20 ml/hr CNG, advance by 10 ml q 6 hours to a goal rate of 80 ml/hr CNG Decrease IVF for every increase in TF rate BMP  And phos level each AM until at goal rate Tube feedings will provide 2304 Kcal, 106 grams protein, 1555 ml free water   Nutrition Dx:Inadequate oral intake r/t hyperemesis aeb weight loss and pt report  No educational needs assessed at this time.

## 2011-04-12 NOTE — Progress Notes (Signed)
Feels really bad today. Has pain in her chest and head.

## 2011-04-12 NOTE — Progress Notes (Signed)
UR CHART REVIEW COMPLETED.

## 2011-04-12 NOTE — Plan of Care (Signed)
Feeding tube attempted 3 times per Duanne Guess RN unsuccessful

## 2011-04-12 NOTE — Progress Notes (Signed)
This note also relates to the following rows which could not be included: CBG Lab Component - View only - Cannot attach notes to extension rows apple juice, 2 graham crackers and 1tbsp peanut butter given.

## 2011-04-12 NOTE — Progress Notes (Signed)
[place]   Agencies that are Medicare-Certified and are affiliated with The [place]  Telephone Number Address  [place] has ownership interest in this company; however, you are under no obligation to use this agency. [phone] or  [place], [place] [address]   Agencies that are Medicare-Certified and are not affiliated with [facility] [place] Telephone Number Address  [facility] [phone] Fax [phone] [address], [place], [place]  [address]  [facility] [phone] or [phone] Fax [phone] [id number] [place], [place] [address]  [facility] [phone] Fax [phone] [place], [place] [address]  [place] [phone] Fax [phone] [address], [place], [place]  [address]  [facility] [phone] Fax [phone] [address] [place], [place] [address]  [facility] [place], [place] [address]  [facility] [phone]  [address] [place], [place] [address]  [facility] [phone] or [phone] Fax [phone] [place] [address], [place] [address] [place], [place]  [address]  [facility] [phone] Fax [phone] [place], [place]  [address]  [place]  [phone] Fax [phone] [address] [place], [place] [address]               Agencies that are not Medicare-Certified and are not affiliated with The [place] Telephone Number Address  [place] or [phone] Fax [phone] [address] [place]  [address]  [facility] [phone] Fax [phone] [address] [place], [place]  [address]  [facility]  [phone] Fax [phone] [address] [place], [place] [address]  [facility] [phone] Fax [phone] [address] [place], [place] [address]  [facility] [phone] or [phone] Fax [phone] [address], [place], [place]  [address]  [facility] [phone] or [phone] Fax [phone] [address] [place], [place]  [address]  [facility] [phone] Fax [phone] [phone] [place], [place]  [address]  [facility] [phone] [address] [place], [place]  [address]  [place] [phone] Fax [phone] [address] [place], [place]  [address]  [place]. [phone] Fax [phone] [address] [place], [place]  [address]  [place]. [phone] Fax [phone] [address] [place], [place] [address]  [facility] [place] [phone] Fax [phone] [address] [place], [place]  [address]

## 2011-04-12 NOTE — Progress Notes (Incomplete)
To antenatal for evaluation, fluids, feeding tube, arrange home health f/u, plan to discharge later today.

## 2011-04-13 ENCOUNTER — Telehealth: Payer: Self-pay | Admitting: *Deleted

## 2011-04-13 LAB — GLUCOSE, CAPILLARY
Glucose-Capillary: 116 mg/dL — ABNORMAL HIGH (ref 70–99)
Glucose-Capillary: 123 mg/dL — ABNORMAL HIGH (ref 70–99)
Glucose-Capillary: 120 mg/dL — ABNORMAL HIGH (ref 70–99)

## 2011-04-13 MED ORDER — AMITRIPTYLINE HCL 25 MG PO TABS: ORAL_TABLET | ORAL | Status: DC

## 2011-04-13 MED ORDER — OXYCODONE-ACETAMINOPHEN 5-325 MG PO TABS: 1.0000 | ORAL_TABLET | ORAL | Status: AC | PRN

## 2011-04-13 MED ORDER — DIPHENOXYLATE-ATROPINE 2.5-0.025 MG PO TABS: 1.0000 | ORAL_TABLET | Freq: Four times a day (QID) | ORAL | Status: AC | PRN

## 2011-04-13 MED ORDER — DIPHENOXYLATE-ATROPINE 2.5-0.025 MG/5ML PO LIQD: 10.0000 mL | Freq: Four times a day (QID) | ORAL | Status: DC | PRN

## 2011-04-13 MED ORDER — ACETAMINOPHEN 325 MG PO TABS
650.0000 mg | ORAL_TABLET | Freq: Four times a day (QID) | ORAL | Status: DC | PRN
  Administered 2011-04-13: 650 mg via ORAL
  Filled 2011-04-13: qty 2

## 2011-04-13 MED ORDER — OXYCODONE-ACETAMINOPHEN 5-325 MG PO TABS
1.0000 | ORAL_TABLET | ORAL | Status: DC | PRN
  Administered 2011-04-13: 1 via ORAL
  Filled 2011-04-13: qty 1

## 2011-04-13 MED ORDER — DIPHENOXYLATE-ATROPINE 2.5-0.025 MG PO TABS: 1.0000 | ORAL_TABLET | Freq: Four times a day (QID) | ORAL | Status: DC | PRN

## 2011-04-13 NOTE — Discharge Summary (Signed)
Physician Discharge Summary  Patient ID: [name] MRN: [id number] DOB/AGE: 03/04/1980 31 y.o.  Admit date: 04/12/2011 Discharge date: 04/13/2011  Admission Diagnoses:  Discharge Diagnoses:  Active Problems:  * No active hospital problems. *  Diarrhea, dehydration   Discharged Condition: fair  Hospital Course: Improved with treatment 3/25-3/26  Consults: nutrition  Significant Diagnostic Studies:   Treatments: IV hydration and antiemetics  Discharge Exam: Blood pressure 106/75, pulse 80, temperature 97.5 F (36.4 C), temperature source Axillary, resp. rate 18, height 5\' 6"  (1.676 m), weight 78.926 kg (174 lb), last menstrual period 09/19/2010. General appearance: alert, fatigued and mild distress GI: soft, non-tender; bowel sounds normal; no masses,  no organomegaly Gravid  Disposition: 50-Hospice/Home  Discharge Orders    Future Appointments: Provider: Department: Dept Phone: Center:   04/16/2011 2:15 PM [facility] [place] [facility] [id number] [facility]   04/19/2011 10:45 AM Donnamae Jude, MD [facility] [phone] [place]     Medication List  As of 04/13/2011  1:59 PM   STOP taking these medications         escitalopram 20 MG tablet         TAKE these medications         albuterol 108 (90 BASE) MCG/ACT inhaler   Commonly known as: PROVENTIL HFA;VENTOLIN HFA   Inhale 2 puffs into the lungs every 6 (six) hours as needed. For shortness of breath      amitriptyline 25 MG tablet   Commonly known as: ELAVIL   1 PO HS      cyclobenzaprine 5 MG tablet   Commonly known as: FLEXERIL   Take 1 tablet (5 mg total) by mouth 3 (three) times daily as needed for muscle spasms (or back pain).      diphenoxylate-atropine 2.5-0.025 MG per tablet   Commonly known as: LOMOTIL   Take 1 tablet by mouth 4 (four) times daily as needed for diarrhea or loose stools.      flintstones complete 60 MG chewable tablet   Chew 2 tablets by mouth daily.      hydrOXYzine 25 MG  tablet   Commonly known as: ATARAX/VISTARIL   Take 1-2 tablets (25-50 mg total) by mouth 2 (two) times daily as needed for anxiety.      insulin aspart 100 UNIT/ML injection   Commonly known as: novoLOG   Inject 10 Units into the skin 3 (three) times daily before meals.      insulin NPH 100 UNIT/ML injection   Commonly known as: HUMULIN N,NOVOLIN N   Inject 5 Units into the skin 2 (two) times daily. 5 units SQ 10 am and 10 pm.      omeprazole 40 MG capsule   Commonly known as: PRILOSEC   Take 1 capsule (40 mg total) by mouth daily.      oxyCODONE-acetaminophen 5-325 MG per tablet   Commonly known as: PERCOCET   Take 1-2 tablets by mouth every 4 (four) hours as needed (back pain).      promethazine 25 MG suppository   Commonly known as: PHENERGAN   Place 25 mg rectally every 6 (six) hours as needed. For nausea           Follow-up Information    Follow up with [facility] on 04/19/2011.       Home health IV therapy D/C Lexapro due to side-effects, change to Elavil HS Signed: Tivis Wherry 04/13/2011, 1:59 PM

## 2011-04-13 NOTE — Discharge Instructions (Signed)
Hyperemesis Gravidarum  Hyperemesis gravidarum is a severe form of nausea and vomiting that happens during pregnancy. Hyperemesis is worse than morning sickness. It may cause a woman to have nausea or vomiting all day for many days. It may keep a woman from eating and drinking enough food and liquids. Hyperemesis usually occurs during the first half (the first 20 weeks) of pregnancy. It often goes away once a woman is in her second half of pregnancy. However, sometimes hyperemesis continues through an entire pregnancy.   CAUSES   The cause of this condition is not completely known but is thought to be due to changes in the body's hormones when pregnant. It could be the high level of the pregnancy hormone or an increase in estrogen in the body.   SYMPTOMS    Severe nausea and vomiting.   Nausea that does not go away.   Vomiting that does not allow you to keep any food down.   Weight loss and body fluid loss (dehydration).   Having no desire to eat or not liking food you have previously enjoyed.  DIAGNOSIS   Your caregiver may ask you about your symptoms. Your caregiver may also order blood tests and urine tests to make sure something else is not causing the problem.   TREATMENT   You may only need medicine to control the problem. If medicines do not control the nausea and vomiting, you will be treated in the hospital to prevent dehydration, acidosis, weight loss, and changes in the electrolytes in your body that may harm the unborn baby (fetus). You may need intravenous (IV) fluids.   HOME CARE INSTRUCTIONS    Take all medicine as directed by your caregiver.   Try eating a couple of dry crackers or toast in the morning before getting out of bed.   Avoid foods and smells that upset your stomach.   Avoid fatty and spicy foods. Eat 5 to 6 small meals a day.   Do not drink when eating meals. Drink between meals.   For snacks, eat high protein foods, such as cheese. Eat or suck on things that have ginger in  them. Ginger helps nausea.   Avoid food preparation. The smell of food can spoil your appetite.   Avoid iron pills and iron in your multivitamins until after 3 to 4 months of being pregnant.  SEEK MEDICAL CARE IF:    Your abdominal pain increases since the last time you saw your caregiver.   You have a severe headache.   You develop vision problems.   You feel you are losing weight.  SEEK IMMEDIATE MEDICAL CARE IF:    You are unable to keep fluids down.   You vomit blood.   You have constant nausea and vomiting.   You have a fever.   You have excessive weakness, dizziness, fainting, or extreme thirst.  MAKE SURE YOU:    Understand these instructions.   Will watch your condition.   Will get help right away if you are not doing well or get worse.  Document Released: 01/04/2005 Document Revised: 12/24/2010 Document Reviewed: 04/06/2010  [employer] Patient Information 2012 [employer].

## 2011-04-13 NOTE — Progress Notes (Signed)
Pt reminded to drink apple juice. States she fell asleep.

## 2011-04-13 NOTE — Telephone Encounter (Signed)
Message left on nurse voice mail from [place] with some concerns about pt.  She has been following pt's progress and status during this inpatient stay via EMR review. She wanted to share information from the pt's home skilled nurse visit on 04/08/11. She stated that the pt did not have heat or electricity in her home. She also stated that the pt was disinterested and would not engage in learning how to self administer her IV fluids. I returned her call and informed her that Jaliya has been discharged and will be leaving the hospital soon. According to her nurse on Antenatal unit, Raegin has been tolerating solid food very well. She will leave with her IV site intact for home IV hydration. Emmit Alexanders will try to arrange a skilled nurse visit in Rola's home tonight or first thing tomorrow morning. I also called Dr. Roselie Awkward and informed him of the information reported from [place] regarding Britney's social situation and lack of desire to learn how to self-administer IV fluids.

## 2011-04-13 NOTE — H&P (Signed)
Agree with above note.  [name] H. 04/13/2011 9:45 PM

## 2011-04-13 NOTE — ED Provider Notes (Signed)
Attestation of Attending Supervision of Advanced Practitioner: Evaluation and management procedures were performed by the PA/NP/CNM/OB Fellow under my supervision/collaboration. Chart reviewed and agree with management and plan.  Kinzee Happel V 04/13/2011 1:08 PM

## 2011-04-13 NOTE — Progress Notes (Signed)
Patient ID: [name], female   DOB: [birth year], 31 y.o.   MRN: [id number] [place]) NOTE  [name] is a 31 y.o. [id number] at [redacted]w[redacted]d by LMP who is admitted for nausea and vomiting in pregnancy.   Fetal presentation is unsure. Length of Stay:  1  Days  Subjective: Ate this AM, diarrhea, stool incontinence, back pain Patient reports the fetal movement as active. Patient reports uterine contraction  activity as none. Patient reports  vaginal bleeding as none. Patient describes fluid per vagina as None.  Vitals:  Blood pressure 115/68, pulse 81, temperature 98.7 F (37.1 C), temperature source Oral, resp. rate 20, height 5\' 6"  (1.676 m), weight 78.926 kg (174 lb), last menstrual period 09/19/2010. Physical Examination:  General appearance - alert, well appearing, and in no distress and ill-appearing Heart - normal rate and regular rhythm Abdomen - soft, nontender, nondistended Fundal Height:  size equals dates Cervical Exam: Not evaluated. and found to be not evaluated/ / and fetal presentation is unsure. Extremities: extremities normal, atraumatic, no cyanosis or edema and Homans sign is negative, no sign of DVT with DTRs 2+ bilaterally Membranes:intact  Fetal Monitoring:  Baseline: 145 bpm  Labs:  Recent Results (from the past 24 hour(s))  BASIC METABOLIC PANEL   Collection Time   04/12/11 11:30 AM      Component Value Range   Sodium 132 (*) 135 - 145 (mEq/L)   Potassium 3.6  3.5 - 5.1 (mEq/L)   Chloride 97  96 - 112 (mEq/L)   CO2 24  19 - 32 (mEq/L)   Glucose, Bld 126 (*) 70 - 99 (mg/dL)   BUN 11  6 - 23 (mg/dL)   Creatinine, Ser 1.27 (*) 0.50 - 1.10 (mg/dL)   Calcium 8.6  8.4 - 10.5 (mg/dL)   GFR calc non Af Amer 55 (*) >90 (mL/min)   GFR calc Af Amer 64 (*) >90 (mL/min)  CBC   Collection Time   04/12/11 11:30 AM      Component Value Range   WBC 4.4  4.0 - 10.5 (K/uL)   RBC 2.81 (*) 3.87 - 5.11 (MIL/uL)   Hemoglobin 7.2 (*) 12.0 -  15.0 (g/dL)   HCT 22.1 (*) 36.0 - 46.0 (%)   MCV 78.6  78.0 - 100.0 (fL)   MCH 25.6 (*) 26.0 - 34.0 (pg)   MCHC 32.6  30.0 - 36.0 (g/dL)   RDW 13.6  11.5 - 15.5 (%)   Platelets 233  150 - 400 (K/uL)  GLUCOSE, CAPILLARY   Collection Time   04/12/11 12:44 PM      Component Value Range   Glucose-Capillary 102 (*) 70 - 99 (mg/dL)  GLUCOSE, CAPILLARY   Collection Time   04/12/11  4:30 PM      Component Value Range   Glucose-Capillary 153 (*) 70 - 99 (mg/dL)   Comment 1 Notify RN     Comment 2 Documented in Chart    GLUCOSE, CAPILLARY   Collection Time   04/12/11  8:45 PM      Component Value Range   Glucose-Capillary 105 (*) 70 - 99 (mg/dL)   Comment 1 Notify RN     Comment 2 Documented in Chart    GLUCOSE, CAPILLARY   Collection Time   04/12/11 10:44 PM      Component Value Range   Glucose-Capillary 66 (*) 70 - 99 (mg/dL)  GLUCOSE, CAPILLARY   Collection Time   04/13/11 12:16 AM  Component Value Range   Glucose-Capillary 116 (*) 70 - 99 (mg/dL)  GLUCOSE, CAPILLARY   Collection Time   04/13/11  6:12 AM      Component Value Range   Glucose-Capillary 123 (*) 70 - 99 (mg/dL)     Medications:  Scheduled    . docusate sodium  100 mg Oral Daily  . gi cocktail  30 mL Oral STAT  . gi cocktail  30 mL Oral STAT  . insulin aspart  0-15 Units Subcutaneous TID PC & HS  . loperamide  4 mg Oral STAT  . pantoprazole  40 mg Oral Daily  . prenatal multivitamin  1 tablet Oral Daily  . DISCONTD: gi cocktail  30 mL Oral STAT  . DISCONTD: insulin aspart  0-15 Units Subcutaneous Q4H   I have reviewed the patient's current medications.  ASSESSMENT: Patient Active Problem List  Diagnoses  . Type 2 diabetes mellitus  . Hyperemesis gravidarum before end of [redacted] week gestation, dehydration  . Chronic hypertension in pregnancy  . Depression  . Asthma  . H. pylori infection  . Supervision of high-risk pregnancy  Diarrhea, possible gastroenteritis  PLAN: Lomotil, Percocet for  backache, home health for discharge  Krisalyn Yankowski 04/13/2011,9:35 AM

## 2011-04-13 NOTE — Progress Notes (Signed)
   CARE MANAGEMENT NOTE 04/13/2011  Patient:  [name], [name]   Account Number:  [id number]  Date Initiated:  04/12/2011  Documentation initiated by:  Juliet Rude  Subjective/Objective Assessment:   hyperemesis     Action/Plan:  Home with [facility] for IV Fluids Anticipated DC Date:  04/13/2011   Anticipated DC Plan:  [place]  CM consult        DME arranged  IV PUMP/EQUIPMENT      DME agency  [facility] arranged  HH-1 RN      [place].    Comments:  04/13/11- H. Eulas Post, RN, BSN 1200- Spoke with Dr. Roselie Awkward about plan for patient. Plan is for discharge later today with home health RN for IV fluids. Orders received. Spoke with patient about choice of agencies. Patient has agreed to continue with [facility]. Encouraged communication with staff to make them aware of where she is staying at time of scheduled RN visits. Orders entered into Epic and referral placed into [facility]. Spoke with Levora Dredge, RN with [facility] to make her aware of discharge plan. Please call with any additional discharge needs [phone]).   04/12/11 1700 L. Arvella Nigh [facility] [id number]) - patient came in today with n/v and attempted to put feeding tube in patient. Nursing staff was unable to get feeding tube in at this time. MD ordered for patient to try food which she did tolerate so po food this afternoon. Patient has IV fluids. Patient has been an active patient with [facility]. Levora Dredge liason with [facility] [phone] that patient was in [place] went in to see patient and discuss discharge plan. No orders yet but patient undecided if she wants to use [facility] again. She is currently active with [facility]. Nurse Care Manager gave patient her business card and also a [place] list of agencies in [place]. Instructed patient to have her RN on unit call [place]  when she chooses an agency. No MD orders for Home Health at this time but DC planning started in preparation if she needs any IV fluids at home upon discharge. Please call nurse care manager with any needs. On Tuesday call: Idell Pickles [phone]

## 2011-04-13 NOTE — Progress Notes (Signed)
Pt incontinent of stool.

## 2011-04-14 ENCOUNTER — Telehealth: Payer: Self-pay | Admitting: *Deleted

## 2011-04-14 NOTE — Telephone Encounter (Signed)
Received call from [place]. She stated that a nurse had called [place] yesterday afternoon after our conversation. A friend answered the call and stated that [facility] should be home by 1630. When the nurse arrived, there was no one there. The nurse tried to contact Dr. Elly Modena (on call MD) but was unable to speak with her. (they had the office phone number, not MD's cell #) The staff of [facility] are very frustrated that they have not been able to provide care for [facility]. There have been problems with the contact telephone numbers and with Harolyn not being at home for scheduled visits. She does not have a reliable telephone number that she answers so that [facility] can contact her. I provided [place] with the phone number for on-call MD in case their staff need to speak with the doctor regarding Lashunda's care. Also, Emmit Alexanders stated that they will attempt to contact [place] again today for a home visit. I stated that I will give a report to Dr. Kennon Rounds this morning regarding this matter. I advised Ambra to call me again if she has further questions or problems.

## 2011-04-14 NOTE — Telephone Encounter (Signed)
Dr. Elly Modena stopped by my office this morning to give an update on Ms. [name].  She states that when [place] went to the patient's home it was discovered she has no heat and no running water.  I have contacted Terri Piedra (hospital social worker) who saw Ms. [name] yesterday before she was discharged from the hospital.  Jaclyn Shaggy is going to try to reach Ms. [name] via telephone today to give her some resources for assistance.  I have also alerted Cardell Peach (clinic social worker) that patient has an appointment on Monday 04/19/11 at 10:45 am.  I explained to [facility] the issues the patient is having and asked that she see the patient at her appointment Monday and try to offer any applicable assistance.

## 2011-04-16 ENCOUNTER — Other Ambulatory Visit: Payer: Self-pay | Admitting: Family Medicine

## 2011-04-16 ENCOUNTER — Ambulatory Visit (HOSPITAL_COMMUNITY)
Admission: RE | Admit: 2011-04-16 | Discharge: 2011-04-16 | Disposition: A | Discharge status: Home / Self Care | Payer: Medicaid Other | Source: Ambulatory Visit | Attending: Obstetrics & Gynecology | Admitting: Obstetrics & Gynecology

## 2011-04-16 VITALS — BP 123/78 | HR 95 | Wt 180.0 lb

## 2011-04-16 DIAGNOSIS — Z8751 Personal history of pre-term labor: Secondary | ICD-10-CM

## 2011-04-16 DIAGNOSIS — O24919 Unspecified diabetes mellitus in pregnancy, unspecified trimester: Secondary | ICD-10-CM

## 2011-04-16 NOTE — Progress Notes (Signed)
Obstetric ultrasound performed today.  Please see report in [facility].

## 2011-04-19 ENCOUNTER — Ambulatory Visit (INDEPENDENT_AMBULATORY_CARE_PROVIDER_SITE_OTHER): Payer: Medicaid Other | Admitting: Family Medicine

## 2011-04-19 VITALS — BP 117/80 | Temp 97.0°F | Wt 180.2 lb

## 2011-04-19 DIAGNOSIS — O24919 Unspecified diabetes mellitus in pregnancy, unspecified trimester: Secondary | ICD-10-CM

## 2011-04-19 DIAGNOSIS — O10019 Pre-existing essential hypertension complicating pregnancy, unspecified trimester: Secondary | ICD-10-CM

## 2011-04-19 DIAGNOSIS — O10919 Unspecified pre-existing hypertension complicating pregnancy, unspecified trimester: Secondary | ICD-10-CM

## 2011-04-19 LAB — POCT URINALYSIS DIP (DEVICE)
Bilirubin Urine: NEGATIVE
Ketones, ur: NEGATIVE mg/dL
Leukocytes, UA: NEGATIVE
Protein, ur: NEGATIVE mg/dL
Specific Gravity, Urine: 1.02 (ref 1.005–1.030)
pH: 7.5 (ref 5.0–8.0)

## 2011-04-19 NOTE — Progress Notes (Signed)
Edema- "left side of face swells", ankles Pulse 89  Pain- right side

## 2011-04-19 NOTE — Patient Instructions (Signed)
Pregnancy - Third Trimester The third trimester of pregnancy (the last 3 months) is a period of the most rapid growth for you and your baby. The baby approaches a length of 20 inches and a weight of 6 to 10 pounds. The baby is adding on fat and getting ready for life outside your body. While inside, babies have periods of sleeping and waking, suck their thumbs, and hiccups. You can often feel small contractions of the uterus. This is false labor. It is also called Braxton-Hicks contractions. This is like a practice for labor. The usual problems in this stage of pregnancy include more difficulty breathing, swelling of the hands and feet from water retention, and having to urinate more often because of the uterus and baby pressing on your bladder.  PRENATAL EXAMS  Blood work may continue to be done during prenatal exams. These tests are done to check on your health and the probable health of your baby. Blood work is used to follow your blood levels (hemoglobin). Anemia (low hemoglobin) is common during pregnancy. Iron and vitamins are given to help prevent this. You may also continue to be checked for diabetes. Some of the past blood tests may be done again.   The size of the uterus is measured during each visit. This makes sure your baby is growing properly according to your pregnancy dates.   Your blood pressure is checked every prenatal visit. This is to make sure you are not getting toxemia.   Your urine is checked every prenatal visit for infection, diabetes and protein.   Your weight is checked at each visit. This is done to make sure gains are happening at the suggested rate and that you and your baby are growing normally.   Sometimes, an ultrasound is performed to confirm the position and the proper growth and development of the baby. This is a test done that bounces harmless sound waves off the baby so your caregiver can more accurately determine due dates.   Discuss the type of pain  medication and anesthesia you will have during your labor and delivery.   Discuss the possibility and anesthesia if a Cesarean Section might be necessary.   Inform your caregiver if there is any mental or physical violence at home.  Sometimes, a specialized non-stress test, contraction stress test and biophysical profile are done to make sure the baby is not having a problem. Checking the amniotic fluid surrounding the baby is called an amniocentesis. The amniotic fluid is removed by sticking a needle into the belly (abdomen). This is sometimes done near the end of pregnancy if an early delivery is required. In this case, it is done to help make sure the baby's lungs are mature enough for the baby to live outside of the womb. If the lungs are not mature and it is unsafe to deliver the baby, an injection of cortisone medication is given to the mother 1 to 2 days before the delivery. This helps the baby's lungs mature and makes it safer to deliver the baby. CHANGES OCCURING IN THE THIRD TRIMESTER OF PREGNANCY Your body goes through many changes during pregnancy. They vary from person to person. Talk to your caregiver about changes you notice and are concerned about.  During the last trimester, you have probably had an increase in your appetite. It is normal to have cravings for certain foods. This varies from person to person and pregnancy to pregnancy.   You may begin to get stretch marks on your hips,  abdomen, and breasts. These are normal changes in the body during pregnancy. There are no exercises or medications to take which prevent this change.   Constipation may be treated with a stool softener or adding bulk to your diet. Drinking lots of fluids, fiber in vegetables, fruits, and whole grains are helpful.   Exercising is also helpful. If you have been very active up until your pregnancy, most of these activities can be continued during your pregnancy. If you have been less active, it is helpful  to start an exercise program such as walking. Consult your caregiver before starting exercise programs.   Avoid all smoking, alcohol, un-prescribed drugs, herbs and "street drugs" during your pregnancy. These chemicals affect the formation and growth of the baby. Avoid chemicals throughout the pregnancy to ensure the delivery of a healthy infant.   Backache, varicose veins and hemorrhoids may develop or get worse.   You will tire more easily in the third trimester, which is normal.   The baby's movements may be stronger and more often.   You may become short of breath easily.   Your belly button may stick out.   A yellow discharge may leak from your breasts called colostrum.   You may have a bloody mucus discharge. This usually occurs a few days to a week before labor begins.  HOME CARE INSTRUCTIONS   Keep your caregiver's appointments. Follow your caregiver's instructions regarding medication use, exercise, and diet.   During pregnancy, you are providing food for you and your baby. Continue to eat regular, well-balanced meals. Choose foods such as meat, fish, milk and other low fat dairy products, vegetables, fruits, and whole-grain breads and cereals. Your caregiver will tell you of the ideal weight gain.   A physical sexual relationship may be continued throughout pregnancy if there are no other problems such as early (premature) leaking of amniotic fluid from the membranes, vaginal bleeding, or belly (abdominal) pain.   Exercise regularly if there are no restrictions. Check with your caregiver if you are unsure of the safety of your exercises. Greater weight gain will occur in the last 2 trimesters of pregnancy. Exercising helps:   Control your weight.   Get you in shape for labor and delivery.   You lose weight after you deliver.   Rest a lot with legs elevated, or as needed for leg cramps or low back pain.   Wear a good support or jogging bra for breast tenderness during  pregnancy. This may help if worn during sleep. Pads or tissues may be used in the bra if you are leaking colostrum.   Do not use hot tubs, steam rooms, or saunas.   Wear your seat belt when driving. This protects you and your baby if you are in an accident.   Avoid raw meat, cat litter boxes and soil used by cats. These carry germs that can cause birth defects in the baby.   It is easier to loose urine during pregnancy. Tightening up and strengthening the pelvic muscles will help with this problem. You can practice stopping your urination while you are going to the bathroom. These are the same muscles you need to strengthen. It is also the muscles you would use if you were trying to stop from passing gas. You can practice tightening these muscles up 10 times a set and repeating this about 3 times per day. Once you know what muscles to tighten up, do not perform these exercises during urination. It is more likely   to cause an infection by backing up the urine.   Ask for help if you have financial, counseling or nutritional needs during pregnancy. Your caregiver will be able to offer counseling for these needs as well as refer you for other special needs.   Make a list of emergency phone numbers and have them available.   Plan on getting help from family or friends when you go home from the hospital.   Make a trial run to the hospital.   Take prenatal classes with the father to understand, practice and ask questions about the labor and delivery.   Prepare the baby's room/nursery.   Do not travel out of the city unless it is absolutely necessary and with the advice of your caregiver.   Wear only low or no heal shoes to have better balance and prevent falling.  MEDICATIONS AND DRUG USE IN PREGNANCY  Take prenatal vitamins as directed. The vitamin should contain 1 milligram of folic acid. Keep all vitamins out of reach of children. Only a couple vitamins or tablets containing iron may be fatal  to a baby or young child when ingested.   Avoid use of all medications, including herbs, over-the-counter medications, not prescribed or suggested by your caregiver. Only take over-the-counter or prescription medicines for pain, discomfort, or fever as directed by your caregiver. Do not use aspirin, ibuprofen (Motrin, Advil, Nuprin) or naproxen (Aleve) unless OK'd by your caregiver.   Let your caregiver also know about herbs you may be using.   Alcohol is related to a number of birth defects. This includes fetal alcohol syndrome. All alcohol, in any form, should be avoided completely. Smoking will cause low birth rate and premature babies.   Street/illegal drugs are very harmful to the baby. They are absolutely forbidden. A baby born to an addicted mother will be addicted at birth. The baby will go through the same withdrawal an adult does.  SEEK MEDICAL CARE IF: You have any concerns or worries during your pregnancy. It is better to call with your questions if you feel they cannot wait, rather than worry about them. DECISIONS ABOUT CIRCUMCISION You may or may not know the sex of your baby. If you know your baby is a boy, it may be time to think about circumcision. Circumcision is the removal of the foreskin of the penis. This is the skin that covers the sensitive end of the penis. There is no proven medical need for this. Often this decision is made on what is popular at the time or based upon religious beliefs and social issues. You can discuss these issues with your caregiver or pediatrician. SEEK IMMEDIATE MEDICAL CARE IF:   An unexplained oral temperature above 102 F (38.9 C) develops, or as your caregiver suggests.   You have leaking of fluid from the vagina (birth canal). If leaking membranes are suspected, take your temperature and tell your caregiver of this when you call.   There is vaginal spotting, bleeding or passing clots. Tell your caregiver of the amount and how many pads are  used.   You develop a bad smelling vaginal discharge with a change in the color from clear to white.   You develop vomiting that lasts more than 24 hours.   You develop chills or fever.   You develop shortness of breath.   You develop burning on urination.   You loose more than 2 pounds of weight or gain more than 2 pounds of weight or as suggested by your   caregiver.   You notice sudden swelling of your face, hands, and feet or legs.   You develop belly (abdominal) pain. Round ligament discomfort is a common non-cancerous (benign) cause of abdominal pain in pregnancy. Your caregiver still must evaluate you.   You develop a severe headache that does not go away.   You develop visual problems, blurred or double vision.   If you have not felt your baby move for more than 1 hour. If you think the baby is not moving as much as usual, eat something with sugar in it and lie down on your left side for an hour. The baby should move at least 4 to 5 times per hour. Call right away if your baby moves less than that.   You fall, are in a car accident or any kind of trauma.   There is mental or physical violence at home.  Document Released: 12/29/2000 Document Revised: 12/24/2010 Document Reviewed: 07/03/2008 [facility] Patient Information 2012 [place]. Breastfeeding BENEFITS OF BREASTFEEDING For the baby  The first milk (colostrum) helps the baby's digestive system function better.   There are antibodies from the mother in the milk that help the baby fight off infections.   The baby has a lower incidence of asthma, allergies, and SIDS (sudden infant death syndrome).   The nutrients in breast milk are better than formulas for the baby and helps the baby's brain grow better.   Babies who breastfeed have less gas, colic, and constipation.  For the mother  Breastfeeding helps develop a very special bond between mother and baby.   It is more convenient, always available at the  correct temperature and cheaper than formula feeding.   It burns calories in the mother and helps with losing weight that was gained during pregnancy.   It makes the uterus contract back down to normal size faster and slows bleeding following delivery.   Breastfeeding mothers have a lower risk of developing breast cancer.  NURSE FREQUENTLY  A healthy, full-term baby may breastfeed as often as every hour or space his or her feedings to every 3 hours.   How often to nurse will vary from baby to baby. Watch your baby for signs of hunger, not the clock.   Nurse as often as the baby requests, or when you feel the need to reduce the fullness of your breasts.   Awaken the baby if it has been 3 to 4 hours since the last feeding.   Frequent feeding will help the mother make more milk and will prevent problems like sore nipples and engorgement of the breasts.  BABY'S POSITION AT THE BREAST  Whether lying down or sitting, be sure that the baby's tummy is facing your tummy.   Support the breast with 4 fingers underneath the breast and the thumb above. Make sure your fingers are well away from the nipple and baby's mouth.   Stroke the baby's lips and cheek closest to the breast gently with your finger or nipple.   When the baby's mouth is open wide enough, place all of your nipple and as much of the dark area around the nipple as possible into your baby's mouth.   Pull the baby in close so the tip of the nose and the baby's cheeks touch the breast during the feeding.  FEEDINGS  The length of each feeding varies from baby to baby and from feeding to feeding.   The baby must suck about 2 to 3 minutes for your milk  to get to him or her. This is called a "let down." For this reason, allow the baby to feed on each breast as long as he or she wants. Your baby will end the feeding when he or she has received the right balance of nutrients.   To break the suction, put your finger into the corner of the  baby's mouth and slide it between his or her gums before removing your breast from his or her mouth. This will help prevent sore nipples.  REDUCING BREAST ENGORGEMENT  In the first week after your baby is born, you may experience signs of breast engorgement. When breasts are engorged, they feel heavy, warm, full, and may be tender to the touch. You can reduce engorgement if you:   Nurse frequently, every 2 to 3 hours. Mothers who breastfeed early and often have fewer problems with engorgement.   Place light ice packs on your breasts between feedings. This reduces swelling. Wrap the ice packs in a lightweight towel to protect your skin.   Apply moist hot packs to your breast for 5 to 10 minutes before each feeding. This increases circulation and helps the milk flow.   Gently massage your breast before and during the feeding.   Make sure that the baby empties at least one breast at every feeding before switching sides.   Use a breast pump to empty the breasts if your baby is sleepy or not nursing well. You may also want to pump if you are returning to work or or you feel you are getting engorged.   Avoid bottle feeds, pacifiers or supplemental feedings of water or juice in place of breastfeeding.   Be sure the baby is latched on and positioned properly while breastfeeding.   Prevent fatigue, stress, and anemia.   Wear a supportive bra, avoiding underwire styles.   Eat a balanced diet with enough fluids.  If you follow these suggestions, your engorgement should improve in 24 to 48 hours. If you are still experiencing difficulty, call your lactation consultant or caregiver. IS MY BABY GETTING ENOUGH MILK? Sometimes, mothers worry about whether their babies are getting enough milk. You can be assured that your baby is getting enough milk if:  The baby is actively sucking and you hear swallowing.   The baby nurses at least 8 to 12 times in a 24 hour time period. Nurse your baby until he or  she unlatches or falls asleep at the first breast (at least 10 to 20 minutes), then offer the second side.   The baby is wetting 5 to 6 disposable diapers (6 to 8 cloth diapers) in a 24 hour period by 22 to 58 days of age.   The baby is having at least 2 to 3 stools every 24 hours for the first few months. Breast milk is all the food your baby needs. It is not necessary for your baby to have water or formula. In fact, to help your breasts make more milk, it is best not to give your baby supplemental feedings during the early weeks.   The stool should be soft and yellow.   The baby should gain 4 to 7 ounces per week after he is 18 days old.  TAKE CARE OF YOURSELF Take care of your breasts by:  Bathing or showering daily.   Avoiding the use of soaps on your nipples.   Start feedings on your left breast at one feeding and on your right breast at the next feeding.  You will notice an increase in your milk supply 2 to 5 days after delivery. You may feel some discomfort from engorgement, which makes your breasts very firm and often tender. Engorgement "peaks" out within 24 to 48 hours. In the meantime, apply warm moist towels to your breasts for 5 to 10 minutes before feeding. Gentle massage and expression of some milk before feeding will soften your breasts, making it easier for your baby to latch on. Wear a well fitting nursing bra and air dry your nipples for 10 to 15 minutes after each feeding.   Only use cotton bra pads.   Only use pure lanolin on your nipples after nursing. You do not need to wash it off before nursing.  Take care of yourself by:   Eating well-balanced meals and nutritious snacks.   Drinking milk, fruit juice, and water to satisfy your thirst (about 8 glasses a day).   Getting plenty of rest.   Increasing calcium in your diet (1200 mg a day).   Avoiding foods that you notice affect the baby in a bad way.  SEEK MEDICAL CARE IF:   You have any questions or difficulty  with breastfeeding.   You need help.   You have a hard, red, sore area on your breast, accompanied by a fever of 100.5 F (38.1 C) or more.   Your baby is too sleepy to eat well or is having trouble sleeping.   Your baby is wetting less than 6 diapers per day, by 88 days of age.   Your baby's skin or white part of his or her eyes is more yellow than it was in the hospital.   You feel depressed.  Document Released: 01/04/2005 Document Revised: 12/24/2010 Document Reviewed: 08/19/2008 [facility] Patient Information 2012 [place].

## 2011-04-19 NOTE — Progress Notes (Signed)
No book, but pt. Reports BS are all < 130, best this entire pregnancy. Last growth on 3/30 with MFM 21% with nml fluid and dopplers.  F/u in 2 wks. Risks of high blood sugar discussed.  BP is great today.  Getting IV fluids from home health through [facility].  Taking Phenergan which is not helping.  Weight is up 6 lbs.

## 2011-04-22 ENCOUNTER — Telehealth: Payer: Self-pay | Admitting: Family Medicine

## 2011-04-22 NOTE — Telephone Encounter (Addendum)
Discussed with Lattie Haw, RN and has scheduled ECHO appointment and called patient- unable to get ahold of patient- patient coming next appt 04/26/11 and will try to notify then. Message copied by Samuel Germany on Thu Apr 22, 2011 11:39 AM ------      Message from: Donnamae Jude      Created: Mon Apr 19, 2011 12:09 PM       Needs Fetal ECHO with Dr. Filbert Schilder scheduled

## 2011-04-22 NOTE — Progress Notes (Signed)
Appointment scheduled for Fetal Echo with Dr. Filbert Schilder on May 06, 2011 at 8 40 am. Attempted to call home# and it is disconnected or changed. Cell # is not taking messages at this time. Patient needs to be advised at next visit of appt. PA #s [id number], [id number].

## 2011-04-26 ENCOUNTER — Ambulatory Visit (INDEPENDENT_AMBULATORY_CARE_PROVIDER_SITE_OTHER): Payer: Medicaid Other | Admitting: Family Medicine

## 2011-04-26 ENCOUNTER — Encounter: Payer: Self-pay | Admitting: Family Medicine

## 2011-04-26 VITALS — BP 131/89 | Temp 97.1°F | Wt 181.5 lb

## 2011-04-26 DIAGNOSIS — O10019 Pre-existing essential hypertension complicating pregnancy, unspecified trimester: Secondary | ICD-10-CM

## 2011-04-26 DIAGNOSIS — O10919 Unspecified pre-existing hypertension complicating pregnancy, unspecified trimester: Secondary | ICD-10-CM

## 2011-04-26 DIAGNOSIS — A048 Other specified bacterial intestinal infections: Secondary | ICD-10-CM

## 2011-04-26 LAB — POCT URINALYSIS DIP (DEVICE)
Hgb urine dipstick: NEGATIVE
Ketones, ur: NEGATIVE mg/dL
Protein, ur: NEGATIVE mg/dL
Specific Gravity, Urine: 1.02 (ref 1.005–1.030)
Urobilinogen, UA: 0.2 mg/dL (ref 0.0–1.0)

## 2011-04-26 NOTE — Patient Instructions (Signed)
Fetal Monitoring, Nonstress Test The nonstress test (NST) is a procedure that monitors the increase of the fetal heart rate in response to the fetal movement. An increase in the fetal heart rate during fetal movement shows that the pregnancy is normal. The increase also shows the well-being of the baby's central nervous system. Fetal activity may be spontaneous, induced a by uterine contraction, or induced by external stimulation with an artificial voice box. Oxytocin stimulation is not used in this procedure. This test is also done to see if there are problems with the pregnancy and the baby. Identifying and correcting problems may prevent serious problems from developing with the fetus, including fetal loss.  OTHER TECHNIQUES OF MONITORING YOUR BABY PRIOR TO BIRTH:  Fetal movement assessment. This is done by the pregnant woman herself by counting and recording the baby's movements over a certain period of theme.   Contraction stress test (CST). This test monitors the baby's heart rate during a contraction of the uterus.   Fetal biophysical profile (BPP). This measures and evaluates 5 observations of the baby:   The nonstress test.   The baby's breathing.   The baby's movements.   The baby's muscle tone.   The amount of fluid in the amniotic sac.   Modified biophysical profile. This measures the volume of fluid in different parts of the amniotic sac (amniotic fluid index) and the results of the nonstress test.   Umbilical artery doppler velocimetry. This evaluates the blood flow through the umbilical cord.  There are several very serious problems that cannot be predicted or detected with any of the fetal monitoring procedures. These problems include separation (abruption) of the placenta and choking of the baby with the umbilical cord (umbilical cord accident). Your caregiver will help you understand the tests and what they mean for you and your baby. It is your responsibility to obtain the  results of your test. LET YOUR CAREGIVER KNOW ABOUT:  Any medications you are taking including prescription and over-the-counter drugs, herbs, eye drops and creams.   If you have a fever.   If you have an infection.   If you are sick.  RISKS AND COMPLICATIONS  There are no risks or complications to the mother or fetus with this test. BEFORE THE PROCEDURE  Do not take medications that may increase or decrease the baby's heart rate and/or movements.   Have a full meal at least 2 hours before the test.   Do not smoke if you are pregnant. If you smoke, stop at least 2 days before the test. It is a good idea not to smoke at all when you are pregnant.  PROCEDURE The NST is based on the idea that the heart rate of a normal baby will speed up while the baby is moving around. This is an indicator of a normal working pregnancy. Loss of movement is seen commonly while the baby is sleeping or if there are problems in the pregnancy. Smoking may hurt test results.  With the patient lying on her side, the fetal heart rate is checked with an electrode on the belly.   The line drawn from a recording instrument (tracing) is observed for fetal heart rate accelerations that speed up at least 15 beats per minute above resting, and last 15 seconds. Tracings of 40 minutes or longer may be necessary.   Sound stimulation of the healthy fetus may speed up a baby's heart. This also means the baby is healthy. Stimulation helps to reduce testing time  and helps find problems in the pregnancy if there is any. To do this, an artificial voice box is put on the mother's belly for about 12 seconds. This may be repeated up to 3 times for progressively longer durations.   Results are categorized as normal (reactive) or abnormal (nonreactive). Commonly, the NST is considered normal (reactive) if there are 2 or more fetal heart rate accelerations as described inside a 20-minute period. This is with or without fetal movement  the mother feels. A nonreactive NST is one that does not have enough fetal heart rate accelerations over a 40 minute period.   Abnormal testing may need further testing.   Your caregiver will help you understand your tests and what they mean for you and your baby.  It should be noted also that:  The NST can be nonreactive 50% of the time in weeks 24 to 28 of a normal pregnancy.   The NST can be nonreactive 15% of the time in weeks 28 to 32 of a normal pregnancy.   Lower fetal heart rate (decelerations) may be seen in 50% of NST's, but if they are consistent (3 in 20 minutes), it increases the risk for Cesarean section.   Decelerations of the fetal heart rate that last for one minute or longer indicates a very serious problem with the baby and a Cesarean section may be needed right away.  AFTER THE PROCEDURE  You may go home and resume your usual activities, or as directed by your caregiver. HOME CARE INSTRUCTIONS   Follow your caregiver's advice and recommendations.   Beware of your baby's movements. Are they normal, less than usual or more than usual?   Make and keep the rest of your prenatal appointments.  SEEK MEDICAL CARE IF:   You develop a temperature of 100 F (37.9 C) or higher.   You have a bloody mucus discharge from the vagina (a bloody show).  SEEK IMMEDIATE MEDICAL CARE IF:   You do not feel the baby move.   You think the baby's movements are less than usual or more than usual.   You develop contractions.   You develop vaginal bleeding.   You develop belly (abdominal) pain.   You have leaking or a gush of fluid from the vagina.  Document Released: 12/25/2001 Document Revised: 12/24/2010 Document Reviewed: 04/30/2008 [facility] Patient Information 2012 [employer].

## 2011-04-26 NOTE — Progress Notes (Signed)
Cold for 1 week, nasal congestion, cough.  Lungs clear.  Recommended dayquil and nyquil.  Still getting IV fluids at home.  Brought blood sugar meter with 6 recent CBGs, mostly <130s.  Encouraged pt to check blood sugars 2hrs after each meal and fasting. Will start twice weekly NST starting next week.

## 2011-04-26 NOTE — Progress Notes (Signed)
Edema-ankles. No vaginal discharge. Pulse 110.

## 2011-04-28 ENCOUNTER — Encounter (HOSPITAL_COMMUNITY): Payer: Self-pay | Admitting: *Deleted

## 2011-04-28 ENCOUNTER — Inpatient Hospital Stay (HOSPITAL_COMMUNITY)
Admission: AD | Admit: 2011-04-28 | Discharge: 2011-04-28 | Disposition: A | Discharge status: Home / Self Care | Payer: Medicaid Other | Source: Ambulatory Visit | Attending: Obstetrics and Gynecology | Admitting: Obstetrics and Gynecology

## 2011-04-28 DIAGNOSIS — O10919 Unspecified pre-existing hypertension complicating pregnancy, unspecified trimester: Secondary | ICD-10-CM

## 2011-04-28 DIAGNOSIS — O212 Late vomiting of pregnancy: Secondary | ICD-10-CM | POA: Insufficient documentation

## 2011-04-28 DIAGNOSIS — O24919 Unspecified diabetes mellitus in pregnancy, unspecified trimester: Secondary | ICD-10-CM | POA: Insufficient documentation

## 2011-04-28 DIAGNOSIS — O219 Vomiting of pregnancy, unspecified: Secondary | ICD-10-CM

## 2011-04-28 DIAGNOSIS — E119 Type 2 diabetes mellitus without complications: Secondary | ICD-10-CM | POA: Insufficient documentation

## 2011-04-28 LAB — URINALYSIS, ROUTINE W REFLEX MICROSCOPIC
Glucose, UA: NEGATIVE mg/dL
Hgb urine dipstick: NEGATIVE
Ketones, ur: 15 mg/dL — AB
Leukocytes, UA: NEGATIVE
pH: 6 (ref 5.0–8.0)

## 2011-04-28 LAB — CBC
MCV: 77.6 fL — ABNORMAL LOW (ref 78.0–100.0)
Platelets: 256 10*3/uL (ref 150–400)
RDW: 13.3 % (ref 11.5–15.5)
WBC: 6.3 10*3/uL (ref 4.0–10.5)

## 2011-04-28 LAB — COMPREHENSIVE METABOLIC PANEL
AST: 9 U/L (ref 0–37)
Albumin: 2.2 g/dL — ABNORMAL LOW (ref 3.5–5.2)
Calcium: 8.9 mg/dL (ref 8.4–10.5)
Chloride: 102 mEq/L (ref 96–112)
Creatinine, Ser: 1 mg/dL (ref 0.50–1.10)
Total Bilirubin: 0.2 mg/dL — ABNORMAL LOW (ref 0.3–1.2)

## 2011-04-28 LAB — URINE MICROSCOPIC-ADD ON

## 2011-04-28 MED ORDER — ACETAMINOPHEN 325 MG PO TABS
650.0000 mg | ORAL_TABLET | Freq: Once | ORAL | Status: AC
  Administered 2011-04-28: 650 mg via ORAL
  Filled 2011-04-28: qty 2

## 2011-04-28 MED ORDER — SODIUM CHLORIDE 0.9 % IJ SOLN
INTRAMUSCULAR | Status: AC
  Filled 2011-04-28: qty 3

## 2011-04-28 MED ORDER — ONDANSETRON HCL 4 MG/2ML IJ SOLN
4.0000 mg | INTRAMUSCULAR | Status: AC
  Administered 2011-04-28: 4 mg via INTRAVENOUS
  Filled 2011-04-28: qty 2

## 2011-04-28 MED ORDER — LACTATED RINGERS IV BOLUS (SEPSIS)
1000.0000 mL | Freq: Once | INTRAVENOUS | Status: AC
  Administered 2011-04-28: 1000 mL via INTRAVENOUS

## 2011-04-28 MED ORDER — FAMOTIDINE IN NACL 20-0.9 MG/50ML-% IV SOLN
20.0000 mg | Freq: Once | INTRAVENOUS | Status: AC
  Administered 2011-04-28: 20 mg via INTRAVENOUS
  Filled 2011-04-28: qty 50

## 2011-04-28 NOTE — MAU Provider Note (Signed)
Attestation of Attending Supervision of Advanced Practitioner: Evaluation and management procedures were performed by the PA/NP/CNM/OB Fellow under my supervision/collaboration. Chart reviewed and agree with management and plan.  Vasilis Luhman V 04/28/2011 11:31 PM

## 2011-04-28 NOTE — Discharge Instructions (Signed)
Blood Sugar Monitoring, Adult  GLUCOSE METERS FOR SELF-MONITORING OF BLOOD GLUCOSE    It is important to be able to correctly measure your blood sugar (glucose). You can use a blood glucose monitor (a small battery-operated device) to check your glucose level at any time. This allows you and your caregiver to monitor your diabetes and to determine how well your treatment plan is working. The process of monitoring your blood glucose with a glucose meter is called self-monitoring of blood glucose (SMBG). When people with diabetes control their blood sugar, they have better health.  To test for glucose with a typical glucose meter, place the disposable strip in the meter. Then place a small sample of blood on the "test strip." The test strip is coated with chemicals that combine with glucose in blood. The meter measures how much glucose is present. The meter displays the glucose level as a number. Several new models can record and store a number of test results. Some models can connect to personal computers to store test results or print them out.    Newer meters are often easier to use than older models. Some meters allow you to get blood from places other than your fingertip. Some new models have automatic timing, error codes, signals, or barcode readers to help with proper adjustment (calibration). Some meters have a large display screen or spoken instructions for people with visual impairments.    INSTRUCTIONS FOR USING GLUCOSE METERS   Wash your hands with soap and warm water, or clean the area with alcohol. Dry your hands completely.    Prick the side of your fingertip with a lancet (a sharp-pointed tool used by hand).    Hold the hand down and gently milk the finger until a small drop of blood appears. Catch the blood with the test strip.     Follow the instructions for inserting the test strip and using the SMBG meter. Most meters require the meter to be turned on and the test strip to be inserted before applying the blood sample.    Record the test result.    Read the instructions carefully for both the meter and the test strips that go with it. Meter instructions are found in the user manual. Keep this manual to help you solve any problems that may arise. Many meters use "error codes" when there is a problem with the meter, the test strip, or the blood sample on the strip. You will need the manual to understand these error codes and fix the problem.    New devices are available such as laser lancets and meters that can test blood taken from "alternative sites" of the body, other than fingertips. However, you should use standard fingertip testing if your glucose changes rapidly. Also, use standard testing if:    You have eaten, exercised, or taken insulin in the past 2 hours.    You think your glucose is low.    You tend to not feel symptoms of low blood glucose (hypoglycemia).    You are ill or under stress.    Clean the meter as directed by the manufacturer.    Test the meter for accuracy as directed by the manufacturer.    Take your meter with you to your caregiver's office. This way, you can test your glucose in front of your caregiver to make sure you are using the meter correctly. Your caregiver can also take a sample of blood to test using a routine lab method. If values   on the glucose meter are close to the lab results, you and your caregiver will see that your meter is working well and you are using good technique. Your caregiver will advise you about what to do if the results do not match.   FREQUENCY OF TESTING     Your caregiver will tell you how often you should check your blood glucose. This will depend on your type of diabetes, your current level of diabetes control, and your types of medicines. The following are general guidelines, but your care plan may be different. Record all your readings and the time of day you took them for review with your caregiver.     Diabetes type 1.    When you are using insulin with good diabetic control (either multiple daily injections or via a pump), you should check your glucose 4 times a day.    If your diabetes is not well controlled, you may need to monitor more frequently, including before meals and 2 hours after meals, at bedtime, and occasionally between 2 a.m. and 3 a.m.    You should always check your glucose before a dose of insulin or before changing the rate on your insulin pump.    Diabetes type 2.    Guidelines for SMBG in diabetes type 2 are not as well defined.    If you are on insulin, follow the guidelines above.    If you are on medicines, but not insulin, and your glucose is not well controlled, you should test at least twice daily.    If you are not on insulin, and your diabetes is controlled with medicines or diet alone, you should test at least once daily, usually before breakfast.    A weekly profile will help your caregiver advise you on your care plan. The week before your visit, check your glucose before a meal and 2 hours after a meal at least daily. You may want to test before and after a different meal each day so you and your caregiver can tell how well controlled your blood sugars are throughout the course of a 24 hour period.    Gestational diabetes (diabetes during pregnancy).    Frequent testing is often necessary. Accurate timing is important.    If you are not on insulin, check your glucose 4 times a day. Check it before breakfast and 1 hour after the start of each meal.     If you are on insulin, check your glucose 6 times a day. Check it before each meal and 1 hour after the first bite of each meal.    General guidelines.    More frequent testing is required at the start of insulin treatment. Your caregiver will instruct you.    Test your glucose any time you suspect you have low blood sugar (hypoglycemia).    You should test more often when you change medicines, when you have unusual stress or illness, or in other unusual circumstances.   OTHER THINGS TO KNOW ABOUT GLUCOSE METERS   Measurement Range. Most glucose meters are able to read glucose levels over a broad range of values from as low as 0 to as high as 600 mg/dL. If you get an extremely high or low reading from your meter, you should first confirm it with another reading. Report very high or very low readings to your caregiver.    Whole Blood Glucose versus Plasma Glucose. Some older home glucose meters measure glucose in your whole blood. In a   lab or when using some newer home glucose meters, the glucose is measured in your plasma (one component of blood). The difference can be important. It is important for you and your caregiver to know whether your meter gives its results as "whole blood equivalent" or "plasma equivalent."    Display of High and Low Glucose Values. Part of learning how to operate a meter is understanding what the meter results mean. Know how high and low glucose concentrations are displayed on your meter.    Factors that Affect Glucose Meter Performance. The accuracy of your test results depends on many factors and varies depending on the brand and type of meter. These factors include:    Low red blood cell count (anemia).    Substances in your blood (such as uric acid, vitamin C, and others).    Environmental factors (temperature, humidity, altitude).    Name-brand versus generic test strips.     Calibration. Make sure your meter is set up properly. It is a good idea to do a calibration test with a control solution recommended by the manufacturer of your meter whenever you begin using a fresh bottle of test strips. This will help verify the accuracy of your meter.    Improperly stored, expired, or defective test strips. Keep your strips in a dry place with the lid on.    Soiled meter.    Inadequate blood sample.   NEW TECHNOLOGIES FOR GLUCOSE TESTING  Alternative site testing  Some glucose meters allow testing blood from alternative sites. These include the:   Upper arm.    Forearm.    Base of the thumb.    Thigh.   Sampling blood from alternative sites may be desirable. However, it may have some limitations. Blood in the fingertips show changes in glucose levels more quickly than blood in other parts of the body. This means that alternative site test results may be different from fingertip test results, not because of the meter's ability to test accurately, but because the actual glucose concentration can be different.    Continuous Glucose Monitoring  Devices to measure your blood glucose continuously are available, and others are in development. These methods can be more expensive than self-monitoring with a glucose meter. However, it is uncertain how effective and reliable these devices are. Your caregiver will advise you if this approach makes sense for you.  IF BLOOD SUGARS ARE CONTROLLED, PEOPLE WITH DIABETES REMAIN HEALTHIER.    SMBG is an important part of the treatment plan of patients with diabetes mellitus. Below are reasons for using SMBG:     It confirms that your glucose is at a specific, healthy level.    It detects hypoglycemia and severe hyperglycemia.    It allows you and your caregiver to make adjustments in response to changes in lifestyle for individuals requiring medicine.     It determines the need for starting insulin therapy in temporary diabetes that happens during pregnancy (gestational diabetes).   Document Released: 01/07/2003 Document Revised: 12/24/2010 Document Reviewed: 04/30/2010  [employer] Patient Information 2012 [employer].

## 2011-04-28 NOTE — MAU Note (Signed)
Note pt d/c'd home with slaine lock intact to left outer hand-home health care will follow up tomorrow-per D.Poe,CNM

## 2011-04-28 NOTE — MAU Note (Signed)
Patient states her IV came out last night. Has been vomiting since this am. Arrived by EMS. States she has a headache and lower left abdominal pain that radiates into back. Denies any bleeding or leaking and reports good fetal movement.

## 2011-04-28 NOTE — MAU Provider Note (Signed)
History     CSN: [id number]  Arrival date and time: 04/28/11 1851      Chief Complaint  Patient presents with  . Emesis During Pregnancy  . Headache  . Abdominal Pain   HPI 31 y.o. [id number] @ [redacted]w[redacted]d wel-know to [facility] presents due to her home PIV coming out yesterday and states [place] nurses tried unsuccessfully to restart IV today. Hasn't keep anything down since yesterday noon and hasn't checked sugars today. Nauseated now. Also c/o indigestion and H/A. No LOF or VB. Good FM.   OB History    Grav Para Term Preterm Abortions TAB SAB Ect Mult Living   4 2 1 1 1  0 0 1 0 2      Past Medical History  Diagnosis Date  . Asthma   . Diabetes mellitus   . Mental disorder   . Depression   . Anxiety   . Hypertension   . Hyperemesis arising during pregnancy     Past Surgical History  Procedure Date  . No past surgeries     Family History  Problem Relation Age of Onset  . Heart disease Mother     CHF  . Anesthesia problems Neg Hx   . Diabetes Sister     History  Substance Use Topics  . Smoking status: Never Smoker   . Smokeless tobacco: Never Used  . Alcohol Use: No     not with preg    Allergies:  Allergies  Allergen Reactions  . Dilaudid (Hydromorphone Hcl) Nausea And Vomiting  . Morphine And Related Nausea And Vomiting    Prescriptions prior to admission  Medication Sig Dispense Refill  . amitriptyline (ELAVIL) 25 MG tablet 1 PO HS  30 tablet  2  . flintstones complete (FLINTSTONES) 60 MG chewable tablet Chew 2 tablets by mouth daily.      Marland Kitchen omeprazole (PRILOSEC) 40 MG capsule Take 1 capsule (40 mg total) by mouth daily.  30 capsule  5  . albuterol (PROVENTIL HFA;VENTOLIN HFA) 108 (90 BASE) MCG/ACT inhaler Inhale 2 puffs into the lungs every 6 (six) hours as needed. For shortness of breath      . insulin aspart (NOVOLOG) 100 UNIT/ML injection Inject 10 Units into the skin 3 (three) times daily before meals.  10 mL  2  . insulin NPH (HUMULIN N,NOVOLIN  N) 100 UNIT/ML injection Inject 5 Units into the skin 2 (two) times daily. 5 units SQ 10 am and 10 pm.      . promethazine (PHENERGAN) 12.5 MG tablet Take 2 tablets (25 mg total) by mouth every 6 (six) hours as needed for nausea.  30 tablet  0  . promethazine (PHENERGAN) 12.5 MG tablet Take 12.5 mg by mouth every 4 (four) hours as needed. For nausea.       . promethazine (PHENERGAN) 25 MG suppository Place 25 mg rectally every 6 (six) hours as needed. For nausea/vomiting. May also use suppositories vaginally       . promethazine (PHENERGAN) 25 MG suppository Place 25 mg rectally every 6 (six) hours as needed. For nausea      . promethazine (PHENERGAN) 25 MG tablet Take 0.5 tablets (12.5 mg total) by mouth every 6 (six) hours as needed for nausea.  20 tablet  0  . promethazine (PHENERGAN) 6.25 MG/5ML syrup Take 20 mLs (25 mg total) by mouth every 6 (six) hours as needed.  120 mL  0  . promethazine (PHENERGAN) 6.25 MG/5ML syrup Take 20 mLs (25 mg total)  by mouth 4 (four) times daily as needed for nausea.  240 mL  2  . promethazine (PHENERGAN) 6.25 MG/5ML syrup Take 20 mLs (25 mg total) by mouth 4 (four) times daily as needed for nausea.  473 mL  3    Review of Systems  Constitutional: Positive for malaise/fatigue. Negative for fever and weight loss.  Cardiovascular: Negative for chest pain.  Gastrointestinal: Positive for heartburn, nausea and vomiting. Negative for abdominal pain.  Neurological: Positive for weakness and headaches.   Physical Exam   Blood pressure 105/72, pulse 104, temperature 98.4 F (36.9 C), temperature source Oral, resp. rate 16, height 5' 5.5" (1.664 m), weight 79.47 kg (175 lb 3.2 oz), last menstrual period 09/19/2010, SpO2 100.00%.  Physical Exam  Constitutional: She is oriented to person, place, and time. No distress.  HENT:  Head: Normocephalic.  Eyes: Pupils are equal, round, and reactive to light.  Neck: Normal range of motion.  GI: Soft. There is no  tenderness.  Musculoskeletal: Normal range of motion.  Neurological: She is alert and oriented to person, place, and time.  Skin: Skin is warm.  Psychiatric: She has a normal mood and affect.    FHR 150's mod varaibility, reactive Toco- w/o UCs  MAU Course  Procedures Zofran 4 mg IV and LR 1000; Tylenol po and Pepcid IV given No vomiting while in unit  Results for orders placed during the hospital encounter of 04/28/11 (from the past 24 hour(s))  URINALYSIS, ROUTINE W REFLEX MICROSCOPIC     Status: Abnormal   Collection Time   04/28/11  7:06 PM      Component Value Range   Color, Urine YELLOW  YELLOW    APPearance CLEAR  CLEAR    Specific Gravity, Urine 1.025  1.005 - 1.030    pH 6.0  5.0 - 8.0    Glucose, UA NEGATIVE  NEGATIVE (mg/dL)   Hgb urine dipstick NEGATIVE  NEGATIVE    Bilirubin Urine SMALL (*) NEGATIVE    Ketones, ur 15 (*) NEGATIVE (mg/dL)   Protein, ur 100 (*) NEGATIVE (mg/dL)   Urobilinogen, UA 1.0  0.0 - 1.0 (mg/dL)   Nitrite NEGATIVE  NEGATIVE    Leukocytes, UA NEGATIVE  NEGATIVE   URINE MICROSCOPIC-ADD ON     Status: Abnormal   Collection Time   04/28/11  7:06 PM      Component Value Range   Squamous Epithelial / LPF MANY (*) RARE    WBC, UA 0-2  <3 (WBC/hpf)   Bacteria, UA MANY (*) RARE    Urine-Other MUCOUS PRESENT    CBC     Status: Abnormal   Collection Time   04/28/11  8:07 PM      Component Value Range   WBC 6.3  4.0 - 10.5 (K/uL)   RBC 2.81 (*) 3.87 - 5.11 (MIL/uL)   Hemoglobin 7.1 (*) 12.0 - 15.0 (g/dL)   HCT 21.8 (*) 36.0 - 46.0 (%)   MCV 77.6 (*) 78.0 - 100.0 (fL)   MCH 25.3 (*) 26.0 - 34.0 (pg)   MCHC 32.6  30.0 - 36.0 (g/dL)   RDW 13.3  11.5 - 15.5 (%)   Platelets 256  150 - 400 (K/uL)  COMPREHENSIVE METABOLIC PANEL     Status: Abnormal   Collection Time   04/28/11  8:07 PM      Component Value Range   Sodium 134 (*) 135 - 145 (mEq/L)   Potassium 3.9  3.5 - 5.1 (mEq/L)   Chloride 102  96 - 112 (mEq/L)   CO2 21  19 - 32 (mEq/L)    Glucose, Bld 86  70 - 99 (mg/dL)   BUN 9  6 - 23 (mg/dL)   Creatinine, Ser 1.00  0.50 - 1.10 (mg/dL)   Calcium 8.9  8.4 - 10.5 (mg/dL)   Total Protein 6.4  6.0 - 8.3 (g/dL)   Albumin 2.2 (*) 3.5 - 5.2 (g/dL)   AST 9  0 - 37 (U/L)   ALT 5  0 - 35 (U/L)   Alkaline Phosphatase 80  39 - 117 (U/L)   Total Bilirubin 0.2 (*) 0.3 - 1.2 (mg/dL)   GFR calc non Af Amer 73 (*) >90 (mL/min)   GFR calc Af Amer 85 (*) >90 (mL/min)     Assessment and Plan  Nausea and vomiting in late pregnancy 31 +wks DM now euglycemic Will discharge with saline lock IV and [place] nurse to come tomorrow for IVF and Phenergan which has been helping. Keep appt for [place] Friday and [facility] twice next week.  Bring log book  Ellie Bryand 04/28/2011, 10:14 PM

## 2011-04-29 ENCOUNTER — Other Ambulatory Visit: Payer: Medicaid Other

## 2011-04-30 ENCOUNTER — Ambulatory Visit (HOSPITAL_COMMUNITY)
Admission: RE | Admit: 2011-04-30 | Discharge: 2011-04-30 | Disposition: A | Discharge status: Home / Self Care | Payer: Medicaid Other | Source: Ambulatory Visit | Attending: Family Medicine | Admitting: Family Medicine

## 2011-04-30 ENCOUNTER — Other Ambulatory Visit (HOSPITAL_COMMUNITY): Payer: Self-pay | Admitting: Maternal and Fetal Medicine

## 2011-04-30 DIAGNOSIS — O24919 Unspecified diabetes mellitus in pregnancy, unspecified trimester: Secondary | ICD-10-CM | POA: Insufficient documentation

## 2011-04-30 DIAGNOSIS — O210 Mild hyperemesis gravidarum: Secondary | ICD-10-CM | POA: Insufficient documentation

## 2011-04-30 DIAGNOSIS — Z8751 Personal history of pre-term labor: Secondary | ICD-10-CM | POA: Insufficient documentation

## 2011-04-30 NOTE — Progress Notes (Signed)
[name] was seen for ultrasound appointment today.  Please see [facility] report for details.

## 2011-05-03 ENCOUNTER — Inpatient Hospital Stay (HOSPITAL_COMMUNITY)
Admission: AD | Admit: 2011-05-03 | Discharge: 2011-05-03 | Disposition: A | Discharge status: Hospice | Payer: Medicaid Other | Source: Ambulatory Visit | Attending: Obstetrics and Gynecology | Admitting: Obstetrics and Gynecology

## 2011-05-03 ENCOUNTER — Encounter (HOSPITAL_COMMUNITY): Payer: Self-pay | Admitting: *Deleted

## 2011-05-03 ENCOUNTER — Other Ambulatory Visit: Payer: Medicaid Other

## 2011-05-03 DIAGNOSIS — O26899 Other specified pregnancy related conditions, unspecified trimester: Secondary | ICD-10-CM

## 2011-05-03 DIAGNOSIS — O211 Hyperemesis gravidarum with metabolic disturbance: Secondary | ICD-10-CM

## 2011-05-03 DIAGNOSIS — O99891 Other specified diseases and conditions complicating pregnancy: Secondary | ICD-10-CM | POA: Insufficient documentation

## 2011-05-03 DIAGNOSIS — O212 Late vomiting of pregnancy: Secondary | ICD-10-CM | POA: Insufficient documentation

## 2011-05-03 DIAGNOSIS — R12 Heartburn: Secondary | ICD-10-CM | POA: Insufficient documentation

## 2011-05-03 MED ORDER — SODIUM CHLORIDE 0.9 % IJ SOLN
INTRAMUSCULAR | Status: DC
Start: 2011-05-03 — End: 2011-05-03
  Filled 2011-05-03: qty 3

## 2011-05-03 MED ORDER — GI COCKTAIL ~~LOC~~
30.0000 mL | Freq: Once | ORAL | Status: AC
  Administered 2011-05-03: 30 mL via ORAL
  Filled 2011-05-03: qty 30

## 2011-05-03 MED ORDER — SODIUM CHLORIDE 0.9 % IV SOLN
25.0000 mg | Freq: Once | INTRAVENOUS | Status: AC
  Administered 2011-05-03: 25 mg via INTRAVENOUS
  Filled 2011-05-03: qty 1

## 2011-05-03 NOTE — MAU Provider Note (Signed)
History     CSN: [id number]  Arrival date and time: 05/03/11 1438   None     No chief complaint on file.  HPI This is a 31 year old G4 P1 112 at 32 weeks and 2 days who presents with complaints of her peripheral IV dripping as well as nausea and heartburn. The nausea and heartburn are similar to previous episodes. She has been no changes. Active fetal movements. No vaginal discharge, vaginal bleeding.  OB History    Grav Para Term Preterm Abortions TAB SAB Ect Mult Living   4 2 1 1 1  0 0 1 0 2      Past Medical History  Diagnosis Date  . Asthma   . Diabetes mellitus   . Mental disorder   . Depression   . Anxiety   . Hypertension   . Hyperemesis arising during pregnancy     Past Surgical History  Procedure Date  . No past surgeries     Family History  Problem Relation Age of Onset  . Heart disease Mother     CHF  . Anesthesia problems Neg Hx   . Diabetes Sister     History  Substance Use Topics  . Smoking status: Never Smoker   . Smokeless tobacco: Never Used  . Alcohol Use: No     not with preg    Allergies:  Allergies  Allergen Reactions  . Dilaudid (Hydromorphone Hcl) Nausea And Vomiting  . Morphine And Related Nausea And Vomiting    Prescriptions prior to admission  Medication Sig Dispense Refill  . albuterol (PROVENTIL HFA;VENTOLIN HFA) 108 (90 BASE) MCG/ACT inhaler Inhale 2 puffs into the lungs every 6 (six) hours as needed. For shortness of breath      . amitriptyline (ELAVIL) 25 MG tablet 1 PO HS  30 tablet  2  . flintstones complete (FLINTSTONES) 60 MG chewable tablet Chew 2 tablets by mouth daily.      . insulin aspart (NOVOLOG) 100 UNIT/ML injection Inject 10 Units into the skin 3 (three) times daily before meals.  10 mL  2  . insulin NPH (HUMULIN N,NOVOLIN N) 100 UNIT/ML injection Inject 5 Units into the skin 2 (two) times daily. 5 units SQ 10 am and 10 pm.      . omeprazole (PRILOSEC) 40 MG capsule Take 1 capsule (40 mg total) by mouth  daily.  30 capsule  5    Review of Systems  All other systems reviewed and are negative.   Physical Exam   Blood pressure 101/79, pulse 81, temperature 97.9 F (36.6 C), temperature source Oral, resp. rate 18, weight 82.101 kg (181 lb), last menstrual period 09/19/2010, SpO2 100.00%.  Physical Exam  Constitutional: She is oriented to person, place, and time. She appears well-developed and well-nourished.  Respiratory: Effort normal.  GI: Soft. Bowel sounds are normal. She exhibits no distension and no mass. There is no tenderness. There is no rebound and no guarding.  Musculoskeletal: Normal range of motion. She exhibits no edema.  Neurological: She is alert and oriented to person, place, and time.  Skin: Skin is warm and dry.  Psychiatric: She has a normal mood and affect. Her behavior is normal. Judgment and thought content normal.   NST: Category 1 tracing.  MAU Course  Procedures  Assessment and Plan  #1 intrauterine pregnancy at 32 weeks and 2 days #2 heartburn #3 hyperemesis gravidarum  Peripheral IV changed. IV fluids with Phenergan given as well as GI cocktail. Patient is tolerating  by mouth fluids and crackers. Patient will be discharged to home to followup at high risk clinic.  Embrie Mikkelsen JEHIEL 05/03/2011, 5:25 PM

## 2011-05-03 NOTE — Discharge Instructions (Signed)
Heartburn During Pregnancy  Heartburn happens when stomach acid goes up into the esophagus. The esophagus is the tube between the mouth and the stomach. This acid causes a burning pain in the chest or throat. This happens more often in the later part of pregnancy because the womb (uterus) gets larger. It may also happen because of hormone changes. Heartburn problems often go away after giving birth. HOME CARE  Take all medicine as told by your doctor.   Raise the head of your bed with blocks only as told by your doctor.   Do not exercise right after eating.   Avoid eating 2 or 3 hours before bed. Do not lie down right after eating.   Eat small meals throughout the day instead of 3 large meals.   Avoid foods that give you heartburn. Foods you may want to avoid include:   Peppers.   Chocolate.   High-fat foods, including fried foods.   Spicy foods.   Garlic and onions.   Citrus fruits, including oranges, grapefruit, lemons, and limes.   Food containing tomatoes or tomato products.   Mint.   Bubbly (carbonated) drinks and drinks with caffeine.   Vinegar.  GET HELP RIGHT AWAY IF:  You have bad chest pain that goes down your arm or into your jaw or neck.   You feel sweaty, dizzy, or lightheaded.   You have trouble breathing.   You throw up (vomit) blood.   You have trouble or pain when swallowing.   You have bloody or black poop (stool).   You have heartburn more than 3 times a week, for more than 2 weeks.  MAKE SURE YOU:  Understand these instructions.   Will watch your condition.   Will get help right away if you are not doing well or get worse.  Document Released: 02/06/2010 Document Revised: 12/24/2010 Document Reviewed: 05/24/2010 [facility] Patient Information 2012 [employer].

## 2011-05-03 NOTE — MAU Note (Signed)
Pt fiance says that her IV site has been leaking and not dripping once he hooked up the fluids.

## 2011-05-06 ENCOUNTER — Other Ambulatory Visit: Payer: Medicaid Other

## 2011-05-14 ENCOUNTER — Encounter (HOSPITAL_COMMUNITY): Payer: Self-pay | Admitting: *Deleted

## 2011-05-14 ENCOUNTER — Inpatient Hospital Stay (HOSPITAL_COMMUNITY)
Admission: AD | Admit: 2011-05-14 | Discharge: 2011-05-14 | Disposition: A | Discharge status: Home / Self Care | Payer: Medicaid Other | Source: Ambulatory Visit | Attending: Obstetrics & Gynecology | Admitting: Obstetrics & Gynecology

## 2011-05-14 DIAGNOSIS — R12 Heartburn: Secondary | ICD-10-CM | POA: Insufficient documentation

## 2011-05-14 DIAGNOSIS — O210 Mild hyperemesis gravidarum: Secondary | ICD-10-CM

## 2011-05-14 DIAGNOSIS — O212 Late vomiting of pregnancy: Secondary | ICD-10-CM | POA: Insufficient documentation

## 2011-05-14 LAB — URINE MICROSCOPIC-ADD ON

## 2011-05-14 LAB — URINALYSIS, ROUTINE W REFLEX MICROSCOPIC
Glucose, UA: NEGATIVE mg/dL
Hgb urine dipstick: NEGATIVE
Specific Gravity, Urine: >1.03 — ABNORMAL HIGH (ref 1.005–1.030)
pH: 5.5 (ref 5.0–8.0)

## 2011-05-14 MED ORDER — SODIUM CHLORIDE 0.9 % IV SOLN
25.0000 mg | Freq: Once | INTRAVENOUS | Status: AC
  Administered 2011-05-14: 25 mg via INTRAVENOUS
  Filled 2011-05-14: qty 1

## 2011-05-14 MED ORDER — GI COCKTAIL ~~LOC~~
30.0000 mL | Freq: Once | ORAL | Status: AC
  Administered 2011-05-14: 30 mL via ORAL
  Filled 2011-05-14: qty 30

## 2011-05-14 MED ORDER — ACETAMINOPHEN 500 MG PO TABS
500.0000 mg | ORAL_TABLET | Freq: Four times a day (QID) | ORAL | Status: DC | PRN
  Administered 2011-05-14: 500 mg via ORAL
  Filled 2011-05-14: qty 1

## 2011-05-14 MED ORDER — ONDANSETRON 8 MG/NS 50 ML IVPB
8.0000 mg | Freq: Once | INTRAVENOUS | Status: AC
  Administered 2011-05-14: 8 mg via INTRAVENOUS
  Filled 2011-05-14: qty 8

## 2011-05-14 NOTE — Progress Notes (Signed)
Written and verbal d/c instructions given and understanding voiced. 

## 2011-05-14 NOTE — Progress Notes (Signed)
Pt and significant other requesting to leave so they can catch the bus. Pt has bus voucher. Dr Nehemiah Settle on unit and aware. D/C orders put in. Pt states she has Phenergan at pharmacy but none at home. To p/u Phenergan to have at home. Unable to talk with Social Worker who can help with [place] referral. RN will attempt to contact [facility] after pt leaves. Pt aware may receive call from [facility] tomorrow. [facility] was in home and pt does not know why they stopped this past wk

## 2011-05-14 NOTE — MAU Provider Note (Addendum)
History    CSN: [id number]  Arrival date and time: 05/14/11 1630  Chief Complaint  Patient presents with  . Emesis During Pregnancy  Hyperemesis Gravidarum  HPI  31 y/o aaf with hyperemesis gravidarum presenting @ 33.6 with [id number] with three days of emesis and nausea. She was being treated with [facility], but this has been stopped per patient.  She follows at the high risk clinic. She has been back to the MAU multiple times for the same complaint. She has no urinary symptoms. No GI symptoms other than heartburn/n/v. No fever. No abdominal pain or chest pain. No bleeding, no vaginal discharge, baby is moving, no contractions.   Past Medical History  Diagnosis Date  . Asthma   . Diabetes mellitus   . Mental disorder   . Depression   . Anxiety   . Hypertension   . Hyperemesis arising during pregnancy     Past Surgical History  Procedure Date  . No past surgeries     Family History  Problem Relation Age of Onset  . Heart disease Mother     CHF  . Anesthesia problems Neg Hx   . Diabetes Sister     History  Substance Use Topics  . Smoking status: Never Smoker   . Smokeless tobacco: Never Used  . Alcohol Use: No     not with preg    Allergies:  Allergies  Allergen Reactions  . Dilaudid (Hydromorphone Hcl) Nausea And Vomiting  . Morphine And Related Nausea And Vomiting    Prescriptions prior to admission  Medication Sig Dispense Refill  . albuterol (PROVENTIL HFA;VENTOLIN HFA) 108 (90 BASE) MCG/ACT inhaler Inhale 2 puffs into the lungs every 6 (six) hours as needed. For shortness of breath      . amitriptyline (ELAVIL) 25 MG tablet 1 PO HS  30 tablet  2  . flintstones complete (FLINTSTONES) 60 MG chewable tablet Chew 2 tablets by mouth daily.      . insulin aspart (NOVOLOG) 100 UNIT/ML injection Inject 10 Units into the skin 3 (three) times daily before meals.  10 mL  2  . insulin NPH (HUMULIN N,NOVOLIN N) 100 UNIT/ML  injection Inject 5 Units into the skin 2 (two) times daily. 5 units SQ 10 am and 10 pm.      . omeprazole (PRILOSEC) 40 MG capsule Take 1 capsule (40 mg total) by mouth daily.  30 capsule  5   Tobacco use: Patient is a non-smoker.   ROS Pertinent items are noted in HPI.  Physical Exam   Blood pressure 109/83, pulse 102, temperature 97.8 F (36.6 C), temperature source Oral, last menstrual period 09/19/2010, SpO2 100.00%.  Physical Exam General: aaf, lying left lateral decub. Appears tired. Answers questions slowly.  Lungs:  Normal respiratory effort, chest expands symmetrically. Lungs are clear to auscultation, no crackles or wheezes. Heart - Regular rate and rhythm.  No murmurs, gallops or rubs.    Abdomen: soft and non-tender without masses, organomegaly or hernias noted.  No guarding or rebound. Gravid.  Skin:  Intact without suspicious lesions or rashes Extremities:   Non-tender, No cyanosis, edema, or deformity noted.  MAU Course  Procedures  MDM - IV Phenergan with LR 1L - zofran - will try and set up home health for patient for IV fluids PRN and IV phenergan.   Assessment and Plan  31 y/o aaf with hyperemesis gravidarum presenting @ 33.6 with [id number]   1. Hyperemesis Gravidarum -This  was better controlled when she had home health maintaining an IV access and giving fluids PRN as well as phenergan IV. - will treat symptoms here today. - I do not see any other signs or symptoms that concern me for any other underlying cause.  2. Heart Burn - GI Cocktail  3. 33.6 gestation No red flags -baby tracing well. - did not check cervix.   4. Dispo: -Pending symptoms after fluids and phenergan Discussed case with Hansel Feinstein, CNM.  Lyndee Hensen MD 05/14/2011, 5:52 PM    I assumed care of this patient from Hansel Feinstein, CNM and Dr Vallarie Mare.  Patient had improvement of symptoms and tolerated PO food and fluids.  I was not able to arrange home health care.  Patient will  follow up with outpatient clinic.  Loma Boston JEHIEL 05/14/2011 9:23 PM

## 2011-05-14 NOTE — MAU Note (Signed)
Dr Vallarie Mare notified of pt's request for Tylenol for h/a. MD will put in order. Social Worker paged again [phone])

## 2011-05-14 NOTE — Discharge Instructions (Signed)
Hyperemesis Gravidarum  Hyperemesis gravidarum is a severe form of nausea and vomiting that happens during pregnancy. Hyperemesis is worse than morning sickness. It may cause a woman to have nausea or vomiting all day for many days. It may keep a woman from eating and drinking enough food and liquids. Hyperemesis usually occurs during the first half (the first 20 weeks) of pregnancy. It often goes away once a woman is in her second half of pregnancy. However, sometimes hyperemesis continues through an entire pregnancy.   CAUSES   The cause of this condition is not completely known but is thought to be due to changes in the body's hormones when pregnant. It could be the high level of the pregnancy hormone or an increase in estrogen in the body.   SYMPTOMS    Severe nausea and vomiting.   Nausea that does not go away.   Vomiting that does not allow you to keep any food down.   Weight loss and body fluid loss (dehydration).   Having no desire to eat or not liking food you have previously enjoyed.  DIAGNOSIS   Your caregiver may ask you about your symptoms. Your caregiver may also order blood tests and urine tests to make sure something else is not causing the problem.   TREATMENT   You may only need medicine to control the problem. If medicines do not control the nausea and vomiting, you will be treated in the hospital to prevent dehydration, acidosis, weight loss, and changes in the electrolytes in your body that may harm the unborn baby (fetus). You may need intravenous (IV) fluids.   HOME CARE INSTRUCTIONS    Take all medicine as directed by your caregiver.   Try eating a couple of dry crackers or toast in the morning before getting out of bed.   Avoid foods and smells that upset your stomach.   Avoid fatty and spicy foods. Eat 5 to 6 small meals a day.   Do not drink when eating meals. Drink between meals.   For snacks, eat high protein foods, such as cheese. Eat or suck on things that have ginger in  them. Ginger helps nausea.   Avoid food preparation. The smell of food can spoil your appetite.   Avoid iron pills and iron in your multivitamins until after 3 to 4 months of being pregnant.  SEEK MEDICAL CARE IF:    Your abdominal pain increases since the last time you saw your caregiver.   You have a severe headache.   You develop vision problems.   You feel you are losing weight.  SEEK IMMEDIATE MEDICAL CARE IF:    You are unable to keep fluids down.   You vomit blood.   You have constant nausea and vomiting.   You have a fever.   You have excessive weakness, dizziness, fainting, or extreme thirst.  MAKE SURE YOU:    Understand these instructions.   Will watch your condition.   Will get help right away if you are not doing well or get worse.  Document Released: 01/04/2005 Document Revised: 12/24/2010 Document Reviewed: 04/06/2010  [employer] Patient Information 2012 [employer].

## 2011-05-14 NOTE — MAU Provider Note (Signed)
Medical Screening exam and patient care preformed by advanced practice provider.  Agree with the above management.  

## 2011-05-14 NOTE — MAU Note (Signed)
[facility] ans serv called. Will have Jessica call RN at MAU.

## 2011-05-14 NOTE — MAU Note (Signed)
Janett Billow from [facility] called back. Does not see that pt's home health services have ended. Janett Billow will send email to person on call tomorrow  for [facility]  to follow up with this. Dr Roselie Awkward is attending tomorrow and has his number in case of any questions.

## 2011-05-14 NOTE — MAU Note (Signed)
Social Worker paged. # [phone]

## 2011-05-19 ENCOUNTER — Other Ambulatory Visit: Payer: Self-pay | Admitting: Family Medicine

## 2011-05-19 ENCOUNTER — Other Ambulatory Visit: Payer: Self-pay | Admitting: Obstetrics & Gynecology

## 2011-05-19 ENCOUNTER — Inpatient Hospital Stay (HOSPITAL_COMMUNITY)
Admission: AD | Admit: 2011-05-19 | Discharge: 2011-05-19 | Disposition: A | Discharge status: Home / Self Care | Payer: Medicaid Other | Source: Ambulatory Visit | Attending: Obstetrics and Gynecology | Admitting: Obstetrics and Gynecology

## 2011-05-19 ENCOUNTER — Encounter (HOSPITAL_COMMUNITY): Payer: Self-pay | Admitting: *Deleted

## 2011-05-19 DIAGNOSIS — O24919 Unspecified diabetes mellitus in pregnancy, unspecified trimester: Secondary | ICD-10-CM

## 2011-05-19 DIAGNOSIS — R1032 Left lower quadrant pain: Secondary | ICD-10-CM | POA: Insufficient documentation

## 2011-05-19 DIAGNOSIS — O210 Mild hyperemesis gravidarum: Secondary | ICD-10-CM

## 2011-05-19 DIAGNOSIS — O212 Late vomiting of pregnancy: Secondary | ICD-10-CM

## 2011-05-19 DIAGNOSIS — O24419 Gestational diabetes mellitus in pregnancy, unspecified control: Secondary | ICD-10-CM

## 2011-05-19 DIAGNOSIS — O99810 Abnormal glucose complicating pregnancy: Secondary | ICD-10-CM | POA: Insufficient documentation

## 2011-05-19 DIAGNOSIS — O219 Vomiting of pregnancy, unspecified: Secondary | ICD-10-CM

## 2011-05-19 LAB — URINALYSIS, ROUTINE W REFLEX MICROSCOPIC
Bilirubin Urine: NEGATIVE
Ketones, ur: NEGATIVE mg/dL
Nitrite: NEGATIVE
Urobilinogen, UA: 1 mg/dL (ref 0.0–1.0)

## 2011-05-19 MED ORDER — GI COCKTAIL ~~LOC~~
30.0000 mL | Freq: Once | ORAL | Status: AC
  Administered 2011-05-19: 30 mL via ORAL
  Filled 2011-05-19: qty 30

## 2011-05-19 MED ORDER — PROMETHAZINE HCL 25 MG/ML IJ SOLN
25.0000 mg | Freq: Once | INTRAMUSCULAR | Status: AC
  Administered 2011-05-19: 25 mg via INTRAMUSCULAR
  Filled 2011-05-19: qty 1

## 2011-05-19 MED ORDER — ACETAMINOPHEN 500 MG PO TABS
1000.0000 mg | ORAL_TABLET | Freq: Once | ORAL | Status: AC
  Administered 2011-05-19: 1000 mg via ORAL
  Filled 2011-05-19: qty 2

## 2011-05-19 MED ORDER — SUCRALFATE 1 GM/10ML PO SUSP: 1.0000 g | Freq: Four times a day (QID) | ORAL | Status: DC

## 2011-05-19 NOTE — MAU Note (Signed)
Pt came in EMS with C/O LLQ & back pain x 2 days & vomitting.  Denies fever, does have HA & dizziness.  Pt states home health nurses came out 2 days in a row but were unable to start an IV.  Pt denies bleeding or LOF.Marland Kitchen

## 2011-05-19 NOTE — MAU Provider Note (Signed)
Chief Complaint:  Abdominal Pain  None    First provider contact 05/19/11 at 1427.   HPI  [name] is  31 y.o. [phone] at [redacted]w[redacted]d presents with N/V, LPB, heartburn, contractions, HA. UC's resolved upon arrival. She has had numerous MAU visits and and hospitalizations for the same. She is supposed to be getting IV fluids and phenergan administered by home health, but the nurses have not been able to get an IV started.   Denies leakage of fluid or vaginal bleeding. Good fetal movement.   Pregnancy Course: uncomplicated  Past Medical History: Past Medical History  Diagnosis Date  . Asthma   . Diabetes mellitus   . Mental disorder   . Depression   . Anxiety   . Hypertension   . Hyperemesis arising during pregnancy     Past Surgical History: Past Surgical History  Procedure Date  . No past surgeries     Family History: Family History  Problem Relation Age of Onset  . Heart disease Mother     CHF  . Anesthesia problems Neg Hx   . Diabetes Sister     Social History: History  Substance Use Topics  . Smoking status: Never Smoker   . Smokeless tobacco: Never Used  . Alcohol Use: No     not with preg    Allergies:  Allergies  Allergen Reactions  . Dilaudid (Hydromorphone Hcl) Nausea And Vomiting  . Morphine And Related Nausea And Vomiting    Meds:  Prescriptions prior to admission  Medication Sig Dispense Refill  . acetaminophen (TYLENOL) 500 MG tablet Take 500 mg by mouth every 6 (six) hours as needed. Head ache      . albuterol (PROVENTIL HFA;VENTOLIN HFA) 108 (90 BASE) MCG/ACT inhaler Inhale 2 puffs into the lungs every 6 (six) hours as needed. For shortness of breath      . amitriptyline (ELAVIL) 25 MG tablet 1 PO HS  30 tablet  2  . diphenhydrAMINE (BENADRYL) 25 MG tablet Take 25 mg by mouth every 6 (six) hours as needed. pollin allergy      . flintstones complete (FLINTSTONES) 60 MG chewable tablet Chew 2 tablets by mouth daily.      . insulin aspart  (NOVOLOG) 100 UNIT/ML injection Inject 10 Units into the skin 3 (three) times daily before meals.  10 mL  2  . insulin NPH (HUMULIN N,NOVOLIN N) 100 UNIT/ML injection Inject 5 Units into the skin 2 (two) times daily. 5 units SQ 10 am and 10 pm.      . omeprazole (PRILOSEC) 40 MG capsule Take 1 capsule (40 mg total) by mouth daily.  30 capsule  5      Physical Exam  Blood pressure 97/80, pulse 96, temperature 97.1 F (36.2 C), temperature source Oral, resp. rate 18, last menstrual period 09/19/2010. GENERAL: Well-developed, well-nourished female in no acute distress. Lethargic. No vomiting while in MAU. Only spitting. HEENT: normocephalic HEART: normal rate RESP: normal effort ABDOMEN: Soft, nontender, nondistended, gravid. BACK: No CVAT. Low back mildly tender bilat. EXTREMITIES: Nontender, no edema NEURO: alert and oriented PELVIC EXAM:Declined FHT:  Baseline 150 , moderate variability, accelerations present, no decelerations Contractions: rare mild   Labs URINALYSIS, ROUTINE W REFLEX MICROSCOPIC   Collection Time   05/19/11  2:42 PM      Component Value Range   Color, Urine YELLOW  YELLOW    APPearance CLEAR  CLEAR    Specific Gravity, Urine 1.020  1.005 - 1.030  pH 7.0  5.0 - 8.0    Glucose, UA NEGATIVE  NEGATIVE (mg/dL)   Hgb urine dipstick NEGATIVE  NEGATIVE    Bilirubin Urine NEGATIVE  NEGATIVE    Ketones, ur NEGATIVE  NEGATIVE (mg/dL)   Protein, ur 30 (*) NEGATIVE (mg/dL)   Urobilinogen, UA 1.0  0.0 - 1.0 (mg/dL)   Nitrite NEGATIVE  NEGATIVE    Leukocytes, UA NEGATIVE  NEGATIVE   URINE MICROSCOPIC-ADD ON   Collection Time   05/19/11  2:42 PM      Component Value Range   Squamous Epithelial / LPF FEW (*) RARE    WBC, UA 0-2  <3 (WBC/hpf)   RBC / HPF 0-2  <3 (RBC/hpf)   Bacteria, UA RARE  RARE   GLUCOSE, CAPILLARY   Collection Time   05/19/11  4:49 PM      Component Value Range   Glucose-Capillary 94  70 - 99 (mg/dL)   Comment 1 Repeat Test     ED course: Per  consult w/ Dr. Elly Modena will restart IV and continue IV fluids and phenergan w/ home health x 1 month. [facility] called. Orders clarified. Pt felt much better after IV fluids w/ phenergan. HA and back ache resolved w/ Tylenol. Heartburn resolved w/ GI Cocktail. Requested Rx.   Imaging:  NA  Assessment: 1. Hyperemesis gravidarum, late   2. Gestational diabetes    Plan: D/C home F/U in [facility] tomorrow. Strongly emphasized importance of keeping appointments and reviewed resources available in the clinic that are not available at ED visits. FOB at [facility]. States he will be in town more now until the end of the pregnancy and will facilitate. PTL precautions. FKCs RX GI cocktail (compounded). May take Carafate if unable to fill. Medication List  As of 05/24/2011  4:06 PM   START taking these medications         sucralfate 1 GM/10ML suspension   Commonly known as: CARAFATE   Take 10 mLs (1 g total) by mouth 4 (four) times daily.         CONTINUE taking these medications         acetaminophen 500 MG tablet   Commonly known as: TYLENOL      albuterol 108 (90 BASE) MCG/ACT inhaler   Commonly known as: PROVENTIL HFA;VENTOLIN HFA      amitriptyline 25 MG tablet   Commonly known as: ELAVIL   1 PO HS      diphenhydrAMINE 25 MG tablet   Commonly known as: BENADRYL      flintstones complete 60 MG chewable tablet      insulin aspart 100 UNIT/ML injection   Commonly known as: novoLOG   Inject 10 Units into the skin 3 (three) times daily before meals.      insulin NPH 100 UNIT/ML injection   Commonly known as: HUMULIN N,NOVOLIN N      omeprazole 40 MG capsule   Commonly known as: PRILOSEC   Take 1 capsule (40 mg total) by mouth daily.          Where to get your medications    These are the prescriptions that you need to pick up.   You may get these medications from any pharmacy.         sucralfate 1 GM/10ML suspension           [place], Cully Luckow 05/19/2011 3:11 PM

## 2011-05-19 NOTE — Discharge Instructions (Signed)
Fetal Movement Counts Patient Name: __________________________________________________ Patient Due Date: ____________________ [name] counts is highly recommended in high risk pregnancies, but it is a good idea for every pregnant woman to do. Start counting fetal movements at 28 weeks of the pregnancy. Fetal movements increase after eating a full meal or eating or drinking something sweet (the blood sugar is higher). It is also important to drink plenty of fluids (well hydrated) before doing the count. Lie on your left side because it helps with the circulation or you can sit in a comfortable chair with your arms over your belly (abdomen) with no distractions around you. DOING THE COUNT  Try to do the count the same time of day each time you do it.   Mark the day and time, then see how long it takes for you to feel 10 movements (kicks, flutters, swishes, rolls). You should have at least 10 movements within 2 hours. You will most likely feel 10 movements in much less than 2 hours. If you do not, wait an hour and count again. After a couple of days you will see a pattern.   What you are looking for is a change in the pattern or not enough counts in 2 hours. Is it taking longer in time to reach 10 movements?  SEEK MEDICAL CARE IF:  You feel less than 10 counts in 2 hours. Tried twice.   No movement in one hour.   The pattern is changing or taking longer each day to reach 10 counts in 2 hours.   You feel the baby is not moving as it usually does.  Date: ____________ Movements: ____________ Start time: ____________ [name] time: ____________  Date: ____________ Movements: ____________ Start time: ____________ [name] time: ____________ Date: ____________ Movements: ____________ Start time: ____________ [name] time: ____________ Date: ____________ Movements: ____________ Start time: ____________ [name] time: ____________ Date: ____________ Movements: ____________ Start time: ____________ [name] time:  ____________ Date: ____________ Movements: ____________ Start time: ____________ [name] time: ____________ Date: ____________ Movements: ____________ Start time: ____________ [name] time: ____________ Date: ____________ Movements: ____________ Start time: ____________ [name] time: ____________  Date: ____________ Movements: ____________ Start time: ____________ [name] time: ____________ Date: ____________ Movements: ____________ Start time: ____________ [name] time: ____________ Date: ____________ Movements: ____________ Start time: ____________ [name] time: ____________ Date: ____________ Movements: ____________ Start time: ____________ [name] time: ____________ Date: ____________ Movements: ____________ Start time: ____________ [name] time: ____________ Date: ____________ Movements: ____________ Start time: ____________ [name] time: ____________ Date: ____________ Movements: ____________ Start time: ____________ [name] time: ____________  Date: ____________ Movements: ____________ Start time: ____________ [name] time: ____________ Date: ____________ Movements: ____________ Start time: ____________ [name] time: ____________ Date: ____________ Movements: ____________ Start time: ____________ [name] time: ____________ Date: ____________ Movements: ____________ Start time: ____________ [name] time: ____________ Date: ____________ Movements: ____________ Start time: ____________ [name] time: ____________ Date: ____________ Movements: ____________ Start time: ____________ [name] time: ____________ Date: ____________ Movements: ____________ Start time: ____________ [name] time: ____________  Date: ____________ Movements: ____________ Start time: ____________ [name] time: ____________ Date: ____________ Movements: ____________ Start time: ____________ [name] time: ____________ Date: ____________ Movements: ____________ Start time: ____________ [name] time: ____________ Date: ____________ Movements:  ____________ Start time: ____________ [name] time: ____________ Date: ____________ Movements: ____________ Start time: ____________ [name] time: ____________ Date: ____________ Movements: ____________ Start time: ____________ [name] time: ____________ Date: ____________ Movements: ____________ Start time: ____________ [name] time: ____________  Date: ____________ Movements: ____________ Start time: ____________ [name] time: ____________ Date: ____________ Movements: ____________ Start time: ____________ [name] time: ____________ Date: ____________ Movements: ____________ Start time:  ____________ [name] time: ____________ Date: ____________ Movements: ____________ Start time: ____________ [name] time: ____________ Date: ____________ Movements: ____________ Start time: ____________ [name] time: ____________ Date: ____________ Movements: ____________ Start time: ____________ [name] time: ____________ Date: ____________ Movements: ____________ Start time: ____________ [name] time: ____________  Date: ____________ Movements: ____________ Start time: ____________ [name] time: ____________ Date: ____________ Movements: ____________ Start time: ____________ [name] time: ____________ Date: ____________ Movements: ____________ Start time: ____________ [name] time: ____________ Date: ____________ Movements: ____________ Start time: ____________ [name] time: ____________ Date: ____________ Movements: ____________ Start time: ____________ [name] time: ____________ Date: ____________ Movements: ____________ Start time: ____________ [name] time: ____________ Date: ____________ Movements: ____________ Start time: ____________ [name] time: ____________  Date: ____________ Movements: ____________ Start time: ____________ [name] time: ____________ Date: ____________ Movements: ____________ Start time: ____________ [name] time: ____________ Date: ____________ Movements: ____________ Start time: ____________ [name]  time: ____________ Date: ____________ Movements: ____________ Start time: ____________ [name] time: ____________ Date: ____________ Movements: ____________ Start time: ____________ [name] time: ____________ Date: ____________ Movements: ____________ Start time: ____________ [name] time: ____________ Date: ____________ Movements: ____________ Start time: ____________ [name] time: ____________  Date: ____________ Movements: ____________ Start time: ____________ [name] time: ____________ Date: ____________ Movements: ____________ Start time: ____________ [name] time: ____________ Date: ____________ Movements: ____________ Start time: ____________ [name] time: ____________ Date: ____________ Movements: ____________ Start time: ____________ [name] time: ____________ Date: ____________ Movements: ____________ Start time: ____________ [name] time: ____________ Date: ____________ Movements: ____________ Start time: ____________ [name] time: ____________ Document Released: 02/03/2006 Document Revised: 12/24/2010 Document Reviewed: 08/06/2008 [employer] Patient Information 2012 [place].  Preventing Preterm Labor Preterm labor is when a pregnant woman has contractions that cause the cervix to open, shorten, and thin before 37 weeks of pregnancy. You will have regular contractions (tightening) 2 to 3 minutes apart. This usually causes discomfort or pain. HOME CARE  Eat a healthy diet.   Take your vitamins as told by your doctor.   Drink enough fluids to keep your pee (urine) clear or pale yellow every day.   Get rest and sleep.   Do not have sex if you are at high risk for preterm labor.   Follow your doctor's advice about activity, medicines, and tests.   Avoid stress.   Avoid hard labor or exercise that lasts for a long time.   Do not smoke.  GET HELP RIGHT AWAY IF:   You are having contractions.   You have belly (abdominal) pain.   You have bleeding from your vagina.   You  have pain when you pee (urinate).   You have abnormal discharge from your vagina.   You have a temperature by mouth above 102 F (38.9 C).  MAKE SURE YOU:  Understand these instructions.   Will watch your condition.   Will get help if you are not doing well or get worse.  Document Released: 04/02/2008 Document Revised: 12/24/2010 Document Reviewed: 04/02/2008 [facility] Patient Information 2012 [place].

## 2011-05-20 ENCOUNTER — Encounter: Payer: Self-pay | Admitting: Family Medicine

## 2011-05-20 ENCOUNTER — Ambulatory Visit (INDEPENDENT_AMBULATORY_CARE_PROVIDER_SITE_OTHER): Payer: Medicaid Other | Admitting: Family Medicine

## 2011-05-20 ENCOUNTER — Encounter (HOSPITAL_COMMUNITY): Payer: Self-pay | Admitting: *Deleted

## 2011-05-20 ENCOUNTER — Inpatient Hospital Stay (HOSPITAL_COMMUNITY)
Admission: AD | Admit: 2011-05-20 | Discharge: 2011-05-20 | Disposition: A | Discharge status: Home / Self Care | Payer: Medicaid Other | Source: Ambulatory Visit | Attending: Obstetrics and Gynecology | Admitting: Obstetrics and Gynecology

## 2011-05-20 VITALS — BP 87/66 | Temp 97.1°F | Wt 174.0 lb

## 2011-05-20 DIAGNOSIS — O10019 Pre-existing essential hypertension complicating pregnancy, unspecified trimester: Secondary | ICD-10-CM

## 2011-05-20 DIAGNOSIS — O212 Late vomiting of pregnancy: Secondary | ICD-10-CM | POA: Insufficient documentation

## 2011-05-20 DIAGNOSIS — O219 Vomiting of pregnancy, unspecified: Secondary | ICD-10-CM

## 2011-05-20 DIAGNOSIS — O10919 Unspecified pre-existing hypertension complicating pregnancy, unspecified trimester: Secondary | ICD-10-CM

## 2011-05-20 LAB — CBC
HCT: 21.6 % — ABNORMAL LOW (ref 36.0–46.0)
Hemoglobin: 6.9 g/dL — CL (ref 12.0–15.0)
MCHC: 31.9 g/dL (ref 30.0–36.0)
MCV: 77.7 fL — ABNORMAL LOW (ref 78.0–100.0)
MCV: 77.7 fL — ABNORMAL LOW (ref 78.0–100.0)
Platelets: 200 10*3/uL (ref 150–400)
RBC: 3 MIL/uL — ABNORMAL LOW (ref 3.87–5.11)
RDW: 13.7 % (ref 11.5–15.5)
RDW: 13.6 % (ref 11.5–15.5)
WBC: 5.8 10*3/uL (ref 4.0–10.5)
WBC: 5.3 10*3/uL (ref 4.0–10.5)

## 2011-05-20 LAB — COMPREHENSIVE METABOLIC PANEL
Albumin: 2.2 g/dL — ABNORMAL LOW (ref 3.5–5.2)
Alkaline Phosphatase: 92 U/L (ref 39–117)
BUN: 9 mg/dL (ref 6–23)
Chloride: 107 mEq/L (ref 96–112)
Creatinine, Ser: 1.12 mg/dL — ABNORMAL HIGH (ref 0.50–1.10)
GFR calc Af Amer: 74 mL/min — ABNORMAL LOW (ref >90)
Glucose, Bld: 93 mg/dL (ref 70–99)
Potassium: 3.9 mEq/L (ref 3.5–5.1)
Total Bilirubin: 0.1 mg/dL — ABNORMAL LOW (ref 0.3–1.2)

## 2011-05-20 LAB — TYPE AND SCREEN
ABO/RH(D): A POS
Antibody Screen: NEGATIVE

## 2011-05-20 LAB — POCT URINALYSIS DIP (DEVICE)
Bilirubin Urine: NEGATIVE
Ketones, ur: NEGATIVE mg/dL
Protein, ur: 100 mg/dL — AB
Specific Gravity, Urine: 1.02 (ref 1.005–1.030)
pH: 7 (ref 5.0–8.0)

## 2011-05-20 LAB — ABO/RH: ABO/RH(D): A POS

## 2011-05-20 MED ORDER — SODIUM CHLORIDE 0.9 % IJ SOLN
INTRAMUSCULAR | Status: AC
  Administered 2011-05-20: 3 mL via INTRAVENOUS
  Filled 2011-05-20: qty 6

## 2011-05-20 MED ORDER — SODIUM CHLORIDE 0.9 % IJ SOLN
INTRAMUSCULAR | Status: AC
  Filled 2011-05-20: qty 3

## 2011-05-20 MED ORDER — FAMOTIDINE IN NACL 20-0.9 MG/50ML-% IV SOLN
20.0000 mg | Freq: Once | INTRAVENOUS | Status: AC
  Administered 2011-05-20: 20 mg via INTRAVENOUS
  Filled 2011-05-20: qty 50

## 2011-05-20 MED ORDER — THIAMINE HCL 100 MG/ML IJ SOLN
Freq: Once | INTRAVENOUS | Status: AC
  Administered 2011-05-20: 12:00:00 via INTRAVENOUS
  Filled 2011-05-20: qty 1000

## 2011-05-20 MED ORDER — SODIUM CHLORIDE 0.9 % IV SOLN
25.0000 mg | Freq: Once | INTRAVENOUS | Status: AC
  Administered 2011-05-20: 25 mg via INTRAVENOUS
  Filled 2011-05-20: qty 1

## 2011-05-20 NOTE — Discharge Instructions (Signed)
Continue your care with the Home Health Nurse. Follow up with the [facility]. Return here as needed.

## 2011-05-20 NOTE — Progress Notes (Signed)
Pulse 132. Patient reports dizziness and feeling lightheaded for past week.

## 2011-05-20 NOTE — MAU Note (Signed)
Sent from clinic for fluids. (gait steady)

## 2011-05-20 NOTE — MAU Note (Signed)
Dr. Nehemiah Settle saw pt this morning in the clinic and sent to MAU for hyperemesis.

## 2011-05-20 NOTE — MAU Note (Signed)
Laboratory notified that pt's hbg level is 6.9. VSmith, CNM notified, no new orders. Will call provider with orthostatic vital signs after completion.

## 2011-05-20 NOTE — Progress Notes (Signed)
Having dizziness, lightheadedness.  Patient sent to MAU

## 2011-05-20 NOTE — MAU Note (Cosign Needed)
No adverse effect from pepcid, pt still feels stomach burning.

## 2011-05-20 NOTE — MAU Provider Note (Signed)
History     CSN: [id number]  Arrival date & time 05/20/11  0901   None     Chief Complaint  Patient presents with  . Hyperemesis Gravidarum    HPI [name] is a 31 y.o. female @ [redacted]w[redacted]d gestation who presents to MAU for nausea and vomiting. She was evaluated in the [facility] here at [facility] this morning and sent to MAU for IV hydration and phenergan. She has a history of hyperemesis and has been treated by Home Health. The history was provided by the patient and her previous medical record.  Past Medical History  Diagnosis Date  . Asthma   . Diabetes mellitus   . Mental disorder   . Depression   . Anxiety   . Hypertension   . Hyperemesis arising during pregnancy     Past Surgical History  Procedure Date  . No past surgeries     Family History  Problem Relation Age of Onset  . Heart disease Mother     CHF  . Anesthesia problems Neg Hx   . Diabetes Sister     History  Substance Use Topics  . Smoking status: Never Smoker   . Smokeless tobacco: Never Used  . Alcohol Use: No     not with preg    OB History    Grav Para Term Preterm Abortions TAB SAB Ect Mult Living   4 2 1 1 1  0 0 1 0 2      Review of Systems  Constitutional: Negative for fever and chills.  Eyes: Negative.   Respiratory: Negative.   Gastrointestinal: Positive for nausea, vomiting, abdominal pain (epigastric) and diarrhea.  Genitourinary: Negative for dysuria, frequency, vaginal bleeding, vaginal pain and pelvic pain.  Musculoskeletal: Negative for back pain.  Skin: Negative.   Neurological: Positive for headaches.    Allergies  Dilaudid and Morphine and related  Home Medications  No current outpatient prescriptions on file.  BP 120/85  Pulse 102  Temp(Src) 97.3 F (36.3 C) (Oral)  Resp 20  Ht 5\' 6"  (1.676 m)  Wt 174 lb (78.926 kg)  BMI 28.08 kg/m2  SpO2 100%  LMP 09/19/2010  Physical Exam  Nursing note and vitals reviewed. Constitutional: She is oriented to person,  place, and time. She appears well-developed and well-nourished. No distress.  HENT:  Head: Normocephalic.  Eyes: EOM are normal.  Neck: Neck supple.  Cardiovascular: Normal rate.   Pulmonary/Chest: Effort normal.  Abdominal: Soft. There is Tenderness: epigastric pain..  Musculoskeletal: Normal range of motion.  Neurological: She is alert and oriented to person, place, and time. No cranial nerve deficit.  Skin: Skin is warm and dry.  Psychiatric: She has a normal mood and affect. Her behavior is normal. Judgment and thought content normal.   Patient had reactive EFM tracing in the [facility] this morning.  Assessment: Nausea, vomiting with dehydration in pregnancy   Anemia  Plan:  IV hydration with phenergan added to fluids   Pepcid 20 mg. IV   Observe  Consult with Dr. Elly Modena. Will d/c patient home to have [facility] return for continued IV hydration and therapy for N/V. Arrangement were made when patient was here yesterday.  Will d/c home with Rx for Protonix. Patient to follow up in Clinic as scheduled.  Results for orders placed during the hospital encounter of 05/20/11 (from the past 24 hour(s))  CBC     Status: Abnormal   Collection Time   05/20/11  9:26 AM  Component Value Range   WBC 5.8  4.0 - 10.5 (K/uL)   RBC 2.78 (*) 3.87 - 5.11 (MIL/uL)   Hemoglobin 6.9 (*) 12.0 - 15.0 (g/dL)   HCT 21.6 (*) 36.0 - 46.0 (%)   MCV 77.7 (*) 78.0 - 100.0 (fL)   MCH 24.8 (*) 26.0 - 34.0 (pg)   MCHC 31.9  30.0 - 36.0 (g/dL)   RDW 13.7  11.5 - 15.5 (%)   Platelets 219  150 - 400 (K/uL)  CBC     Status: Abnormal   Collection Time   05/20/11 11:20 AM      Component Value Range   WBC 5.3  4.0 - 10.5 (K/uL)   RBC 3.00 (*) 3.87 - 5.11 (MIL/uL)   Hemoglobin 7.4 (*) 12.0 - 15.0 (g/dL)   HCT 23.3 (*) 36.0 - 46.0 (%)   MCV 77.7 (*) 78.0 - 100.0 (fL)   MCH 24.7 (*) 26.0 - 34.0 (pg)   MCHC 31.8  30.0 - 36.0 (g/dL)   RDW 13.6  11.5 - 15.5 (%)   Platelets 200  150 - 400 (K/uL)  TYPE AND  SCREEN     Status: Normal   Collection Time   05/20/11 11:20 AM      Component Value Range   ABO/RH(D) A POS     Antibody Screen NEG     Sample Expiration 05/23/2011    ABO/RH     Status: Normal   Collection Time   05/20/11 11:20 AM      Component Value Range   ABO/RH(D) A POS     ED Course  Procedures  MDM

## 2011-05-20 NOTE — MAU Note (Signed)
No adverse effect from zofran or phenergan, pt still rocking in bed, states she is hungry and thirsty.

## 2011-05-21 ENCOUNTER — Other Ambulatory Visit: Payer: Self-pay | Admitting: Obstetrics & Gynecology

## 2011-05-21 ENCOUNTER — Ambulatory Visit (HOSPITAL_COMMUNITY): Admission: RE | Admit: 2011-05-21 | Payer: Medicaid Other | Source: Ambulatory Visit

## 2011-05-21 DIAGNOSIS — O24919 Unspecified diabetes mellitus in pregnancy, unspecified trimester: Secondary | ICD-10-CM

## 2011-05-21 DIAGNOSIS — O210 Mild hyperemesis gravidarum: Secondary | ICD-10-CM

## 2011-05-24 ENCOUNTER — Other Ambulatory Visit: Payer: Medicaid Other

## 2011-05-24 ENCOUNTER — Encounter (HOSPITAL_COMMUNITY): Payer: Self-pay

## 2011-05-24 ENCOUNTER — Inpatient Hospital Stay (HOSPITAL_COMMUNITY)
Admission: AD | Admit: 2011-05-24 | Discharge: 2011-05-24 | Disposition: A | Discharge status: Home / Self Care | Payer: Medicaid Other | Source: Ambulatory Visit | Attending: Obstetrics & Gynecology | Admitting: Obstetrics & Gynecology

## 2011-05-24 DIAGNOSIS — O210 Mild hyperemesis gravidarum: Secondary | ICD-10-CM

## 2011-05-24 DIAGNOSIS — M79609 Pain in unspecified limb: Secondary | ICD-10-CM | POA: Insufficient documentation

## 2011-05-24 DIAGNOSIS — E119 Type 2 diabetes mellitus without complications: Secondary | ICD-10-CM

## 2011-05-24 DIAGNOSIS — O212 Late vomiting of pregnancy: Secondary | ICD-10-CM | POA: Insufficient documentation

## 2011-05-24 DIAGNOSIS — O10919 Unspecified pre-existing hypertension complicating pregnancy, unspecified trimester: Secondary | ICD-10-CM

## 2011-05-24 LAB — COMPREHENSIVE METABOLIC PANEL
Albumin: 2 g/dL — ABNORMAL LOW (ref 3.5–5.2)
Alkaline Phosphatase: 94 U/L (ref 39–117)
BUN: 10 mg/dL (ref 6–23)
Calcium: 8.7 mg/dL (ref 8.4–10.5)
Creatinine, Ser: 1.03 mg/dL (ref 0.50–1.10)
GFR calc Af Amer: 82 mL/min — ABNORMAL LOW (ref >90)
Glucose, Bld: 93 mg/dL (ref 70–99)
Potassium: 4.1 mEq/L (ref 3.5–5.1)
Total Protein: 6.2 g/dL (ref 6.0–8.3)

## 2011-05-24 LAB — URINALYSIS, ROUTINE W REFLEX MICROSCOPIC
Bilirubin Urine: NEGATIVE
Hgb urine dipstick: NEGATIVE
Ketones, ur: NEGATIVE mg/dL
Nitrite: NEGATIVE
Protein, ur: NEGATIVE mg/dL
Specific Gravity, Urine: 1.02 (ref 1.005–1.030)
Urobilinogen, UA: 0.2 mg/dL (ref 0.0–1.0)

## 2011-05-24 LAB — URINE MICROSCOPIC-ADD ON

## 2011-05-24 MED ORDER — LACTATED RINGERS IV BOLUS (SEPSIS)
1000.0000 mL | Freq: Once | INTRAVENOUS | Status: AC
  Administered 2011-05-24: 1000 mL via INTRAVENOUS

## 2011-05-24 MED ORDER — PANTOPRAZOLE SODIUM 40 MG IV SOLR
40.0000 mg | Freq: Once | INTRAVENOUS | Status: AC
  Administered 2011-05-24: 40 mg via INTRAVENOUS
  Filled 2011-05-24: qty 40

## 2011-05-24 MED ORDER — HYDROXYZINE HCL 50 MG PO TABS
50.0000 mg | ORAL_TABLET | Freq: Once | ORAL | Status: AC
  Administered 2011-05-24: 50 mg via ORAL
  Filled 2011-05-24: qty 1

## 2011-05-24 MED ORDER — ONDANSETRON HCL 4 MG/2ML IJ SOLN
4.0000 mg | Freq: Once | INTRAMUSCULAR | Status: AC
  Administered 2011-05-24: 4 mg via INTRAVENOUS
  Filled 2011-05-24: qty 2

## 2011-05-24 MED ORDER — SODIUM CHLORIDE 0.9 % IJ SOLN
INTRAMUSCULAR | Status: AC
  Administered 2011-05-24: 3 mL
  Filled 2011-05-24: qty 3

## 2011-05-24 MED ORDER — HYDROXYZINE HCL 50 MG/ML IM SOLN: 25.0000 mg | Freq: Once | INTRAMUSCULAR | Status: DC

## 2011-05-24 NOTE — MAU Note (Signed)
Pt states protonix helped slightly, no adverse effect.

## 2011-05-24 NOTE — MAU Note (Signed)
No adverse reaction to zofran, feels nausea is slightly improved.

## 2011-05-24 NOTE — MAU Note (Signed)
Dr. Marily Memos notified pt still w/o iv access. RN attempted x2, CRNA attempting to access for iv access as well.

## 2011-05-24 NOTE — Discharge Instructions (Signed)
Pregnancy - Third Trimester   The third trimester begins at the 28th week of pregnancy and ends at birth. It is important to follow your doctor's instructions. HOME CARE   Go to your doctor's visits.   Do not smoke.   Do not drink alcohol or use drugs.   Only take medicine as told by your doctor.   Take prenatal vitamins as told. The vitamin should contain 1 milligram of folic acid.   Exercise.   Eat healthy foods. Eat regular, well-balanced meals.   You can have sex (intercourse) if there are no other problems with the pregnancy.   Do not use hot tubs, steam rooms, or saunas.   Wear a seat belt while driving.   Avoid raw meat, uncooked cheese, and litter boxes and soil used by cats.   Rest with your legs raised (elevated).   Make a list of emergency phone numbers. Keep this list with you.   Arrange for help when you come back home after delivering the baby.   Make a trial run to the hospital.   Take prenatal classes.   Prepare the baby's nursery.   Do not travel out of the city. If you absolutely have to, get permission from your doctor first.   Wear flat shoes. Do not wear high heels.  GET HELP RIGHT AWAY IF:   You have a temperature by mouth above 102 F (38.9 C), not controlled by medicine.   You have not felt the baby move for more than 1 hour. If you think the baby is not moving as much as normal, eat something with sugar in it or lie down on your left side for an hour. The baby should move at least 4 to 5 times per hour.   Fluid is coming from the vagina.   Blood is coming from the vagina. Light spotting is common, especially after sex (intercourse).   You have belly (abdominal) pain.   You have a bad smelling fluid (discharge) coming from the vagina. The fluid changes from clear to white.   You still feel sick to your stomach (nauseous).   You throw up (vomit) for more than 24 hours.   You have the chills.   You have shortness of breath.    You have a burning feeling when you pee (urinate).   You lose or gain more than 2 pounds (0.9 kilograms) of weight over a week, or as told by your doctor.   Your face, hands, feet, or legs get puffy (swell).   You have a bad headache that will not go away.   You start to have problems seeing (blurry or double vision).   You fall, are in a car accident, or have any kind of trauma.   There is mental or physical violence at home.   You have any concerns or worries during your pregnancy.  MAKE SURE YOU:   Understand these instructions.   Will watch your condition.   Will get help right away if you are not doing well or get worse.  Document Released: 03/31/2009 Document Revised: 12/24/2010 Document Reviewed: 03/31/2009 [facility] Patient Information 2012 [place].Fetal Movement Counts Patient Name: __________________________________________________ Patient Due Date: ____________________ [name] counts is highly recommended in high risk pregnancies, but it is a good idea for every pregnant woman to do. Start counting fetal movements at 28 weeks of the pregnancy. Fetal movements increase after eating a full meal or eating or drinking something sweet (the blood sugar is higher). It  is also important to drink plenty of fluids (well hydrated) before doing the count. Lie on your left side because it helps with the circulation or you can sit in a comfortable chair with your arms over your belly (abdomen) with no distractions around you. DOING THE COUNT  Try to do the count the same time of day each time you do it.   Mark the day and time, then see how long it takes for you to feel 10 movements (kicks, flutters, swishes, rolls). You should have at least 10 movements within 2 hours. You will most likely feel 10 movements in much less than 2 hours. If you do not, wait an hour and count again. After a couple of days you will see a pattern.   What you are looking for is a change in the pattern  or not enough counts in 2 hours. Is it taking longer in time to reach 10 movements?  SEEK MEDICAL CARE IF:  You feel less than 10 counts in 2 hours. Tried twice.   No movement in one hour.   The pattern is changing or taking longer each day to reach 10 counts in 2 hours.   You feel the baby is not moving as it usually does.  Date: ____________ Movements: ____________ Start time: ____________ [name] time: ____________  Date: ____________ Movements: ____________ Start time: ____________ [name] time: ____________ Date: ____________ Movements: ____________ Start time: ____________ [name] time: ____________ Date: ____________ Movements: ____________ Start time: ____________ [name] time: ____________ Date: ____________ Movements: ____________ Start time: ____________ [name] time: ____________ Date: ____________ Movements: ____________ Start time: ____________ [name] time: ____________ Date: ____________ Movements: ____________ Start time: ____________ [name] time: ____________ Date: ____________ Movements: ____________ Start time: ____________ [name] time: ____________  Date: ____________ Movements: ____________ Start time: ____________ [name] time: ____________ Date: ____________ Movements: ____________ Start time: ____________ [name] time: ____________ Date: ____________ Movements: ____________ Start time: ____________ [name] time: ____________ Date: ____________ Movements: ____________ Start time: ____________ [name] time: ____________ Date: ____________ Movements: ____________ Start time: ____________ [name] time: ____________ Date: ____________ Movements: ____________ Start time: ____________ [name] time: ____________ Date: ____________ Movements: ____________ Start time: ____________ [name] time: ____________  Date: ____________ Movements: ____________ Start time: ____________ [name] time: ____________ Date: ____________ Movements: ____________ Start time: ____________ [name] time:  ____________ Date: ____________ Movements: ____________ Start time: ____________ [name] time: ____________ Date: ____________ Movements: ____________ Start time: ____________ [name] time: ____________ Date: ____________ Movements: ____________ Start time: ____________ [name] time: ____________ Date: ____________ Movements: ____________ Start time: ____________ [name] time: ____________ Date: ____________ Movements: ____________ Start time: ____________ [name] time: ____________  Date: ____________ Movements: ____________ Start time: ____________ [name] time: ____________ Date: ____________ Movements: ____________ Start time: ____________ [name] time: ____________ Date: ____________ Movements: ____________ Start time: ____________ [name] time: ____________ Date: ____________ Movements: ____________ Start time: ____________ [name] time: ____________ Date: ____________ Movements: ____________ Start time: ____________ [name] time: ____________ Date: ____________ Movements: ____________ Start time: ____________ [name] time: ____________ Date: ____________ Movements: ____________ Start time: ____________ [name] time: ____________  Date: ____________ Movements: ____________ Start time: ____________ [name] time: ____________ Date: ____________ Movements: ____________ Start time: ____________ [name] time: ____________ Date: ____________ Movements: ____________ Start time: ____________ [name] time: ____________ Date: ____________ Movements: ____________ Start time: ____________ [name] time: ____________ Date: ____________ Movements: ____________ Start time: ____________ [name] time: ____________ Date: ____________ Movements: ____________ Start time: ____________ [name] time: ____________ Date: ____________ Movements: ____________ Start time: ____________ [name] time: ____________  Date: ____________ Movements: ____________ Start time: ____________ [name] time: ____________ Date: ____________ Movements:  ____________ Start  time: ____________ [name] time: ____________ Date: ____________ Movements: ____________ Start time: ____________ [name] time: ____________ Date: ____________ Movements: ____________ Start time: ____________ [name] time: ____________ Date: ____________ Movements: ____________ Start time: ____________ [name] time: ____________ Date: ____________ Movements: ____________ Start time: ____________ [name] time: ____________ Date: ____________ Movements: ____________ Start time: ____________ [name] time: ____________  Date: ____________ Movements: ____________ Start time: ____________ [name] time: ____________ Date: ____________ Movements: ____________ Start time: ____________ [name] time: ____________ Date: ____________ Movements: ____________ Start time: ____________ [name] time: ____________ Date: ____________ Movements: ____________ Start time: ____________ [name] time: ____________ Date: ____________ Movements: ____________ Start time: ____________ [name] time: ____________ Date: ____________ Movements: ____________ Start time: ____________ [name] time: ____________ Date: ____________ Movements: ____________ Start time: ____________ [name] time: ____________  Date: ____________ Movements: ____________ Start time: ____________ [name] time: ____________ Date: ____________ Movements: ____________ Start time: ____________ [name] time: ____________ Date: ____________ Movements: ____________ Start time: ____________ [name] time: ____________ Date: ____________ Movements: ____________ Start time: ____________ [name] time: ____________ Date: ____________ Movements: ____________ Start time: ____________ [name] time: ____________ Date: ____________ Movements: ____________ Start time: ____________ [name] time: ____________ Document Released: 02/03/2006 Document Revised: 12/24/2010 Document Reviewed: 08/06/2008 [employer] Patient Information 2012 [employer].

## 2011-05-24 NOTE — MAU Provider Note (Signed)
History     CSN: [id number]  Arrival date and time: 05/24/11 1048   First Provider Initiated Contact with Patient 05/24/11 1118      Chief Complaint  Patient presents with  . Emesis During Pregnancy   HPI  Pt well known to MAU staff w/ numerous visits for similar symptoms throughout pregnancy. Pt complaingin of persistent n/v that she has not been able to control w/ home IV phenergan. Pt reports numerous 1L boluses of IV fluids at home Q6hrs.   Also complaining of intermittent foot numbness/tingling/pain for the past 2 days, that typically lasts seconds at a time, and is not associated w/ any alleviating or aggravating factors. Pt is a known diabetic. Pt reports home sugars are typically between 70s-90s, w/o use of any medications.    OB History    Grav Para Term Preterm Abortions TAB SAB Ect Mult Living   4 2 1 1 1  0 0 1 0 2      Past Medical History  Diagnosis Date  . Asthma   . Diabetes mellitus   . Mental disorder   . Depression   . Anxiety   . Hypertension   . Hyperemesis arising during pregnancy     Past Surgical History  Procedure Date  . No past surgeries     Family History  Problem Relation Age of Onset  . Heart disease Mother     CHF  . Anesthesia problems Neg Hx   . Diabetes Sister     History  Substance Use Topics  . Smoking status: Never Smoker   . Smokeless tobacco: Never Used  . Alcohol Use: No     not with preg    Allergies:  Allergies  Allergen Reactions  . Dilaudid (Hydromorphone Hcl) Nausea And Vomiting  . Morphine And Related Nausea And Vomiting    Prescriptions prior to admission  Medication Sig Dispense Refill  . acetaminophen (TYLENOL) 500 MG tablet Take 500 mg by mouth every 6 (six) hours as needed. Head ache      . albuterol (PROVENTIL HFA;VENTOLIN HFA) 108 (90 BASE) MCG/ACT inhaler Inhale 2 puffs into the lungs every 6 (six) hours as needed. For shortness of breath      . amitriptyline (ELAVIL) 25 MG tablet 1 PO HS  30  tablet  2  . diphenhydrAMINE (BENADRYL) 25 MG tablet Take 25 mg by mouth every 6 (six) hours as needed. pollin allergy      . flintstones complete (FLINTSTONES) 60 MG chewable tablet Chew 2 tablets by mouth daily.      . insulin aspart (NOVOLOG) 100 UNIT/ML injection Inject 10 Units into the skin 3 (three) times daily before meals.  10 mL  2  . insulin NPH (HUMULIN N,NOVOLIN N) 100 UNIT/ML injection Inject 5 Units into the skin 2 (two) times daily. 5 units SQ 10 am and 10 pm.      . omeprazole (PRILOSEC) 40 MG capsule Take 1 capsule (40 mg total) by mouth daily.  30 capsule  5  . sucralfate (CARAFATE) 1 GM/10ML suspension Take 10 mLs (1 g total) by mouth 4 (four) times daily.  420 mL  2    Review of Systems  Constitutional: Negative for fever and chills.  Respiratory: Negative for shortness of breath.   Cardiovascular: Negative for chest pain.  Gastrointestinal: Positive for nausea, vomiting and diarrhea. Negative for heartburn, abdominal pain, constipation and blood in stool.  Genitourinary: Negative for dysuria, urgency, frequency and hematuria.  Neurological: Positive for  dizziness and headaches.   Physical Exam   Pulse 100, resp. rate 16, height 5\' 6"  (1.676 m), weight 78.926 kg (174 lb), last menstrual period 09/19/2010, SpO2 100.00%.  Physical Exam  Constitutional: She is oriented to person, place, and time. She appears well-developed and well-nourished.  HENT:  Head: Normocephalic.  Eyes: EOM are normal.  Neck: Normal range of motion.  Cardiovascular: Normal rate and regular rhythm.  Exam reveals no gallop and no friction rub.   No murmur heard. Respiratory: Effort normal.  GI: Soft. She exhibits no distension. There is no tenderness. There is no rebound and no guarding.  Musculoskeletal: Normal range of motion.  Neurological: She is alert and oriented to person, place, and time.  Skin: Skin is warm and dry.   Strip reviewed. Fetus reactive. No contractions  MAU Course    Procedures  1L IV bolus LR x2 IV Zofran 4mg  Vistaril IV Protonix Continuous montoring x89min Orthost[facility] EKG  Assessment and Plan  31yo [redacted]w[redacted]d [id number] w/ hyperemesis, and vague foot pain complaints significantly improved w/ MAU treatments. Pt able to eat rice crispy treat, grilled fish w/ tartar sauce, vegetables with butter, and chicken crisps w/o n/v. Likely psychosomatic component to pt complaints.  - Labor precautions reviewed - reasons for return reviewed - Continue w/ home health and IV rehydration as needed.  - Continue f/u in [place], [place], MD 05/24/2011, 11:49 AM   Pt seen with resident, agree with above note

## 2011-05-24 NOTE — MAU Note (Signed)
No adverse reaction to atarax

## 2011-05-24 NOTE — MAU Note (Signed)
Pt d/c'd home with iv intact for home health RN to access. Site CDI.

## 2011-05-24 NOTE — MAU Provider Note (Signed)
Agree with above note.  Kerly Rigsbee 05/24/2011 9:43 AM

## 2011-05-24 NOTE — MAU Note (Signed)
Pt hooked herself up to iv fluids at midnight until 0400, fluids had phenergan in it. Pt states phenergan did not help her nausea. Denies bleeding or vaginal d/c changes. Has h/a rates 7/10.

## 2011-05-26 NOTE — MAU Provider Note (Signed)
Agree with above note.  [name] 05/26/2011 8:21 AM

## 2011-05-27 ENCOUNTER — Ambulatory Visit (INDEPENDENT_AMBULATORY_CARE_PROVIDER_SITE_OTHER): Payer: Medicaid Other | Admitting: Obstetrics & Gynecology

## 2011-05-27 ENCOUNTER — Ambulatory Visit (HOSPITAL_COMMUNITY): Payer: Medicaid Other

## 2011-05-27 ENCOUNTER — Ambulatory Visit (HOSPITAL_COMMUNITY)
Admit: 2011-05-27 | Discharge: 2011-05-27 | Disposition: A | Discharge status: Home / Self Care | Payer: Medicaid Other | Attending: Family Medicine | Admitting: Family Medicine

## 2011-05-27 ENCOUNTER — Observation Stay (HOSPITAL_COMMUNITY)
Admission: AD | Admit: 2011-05-27 | Discharge: 2011-05-27 | Disposition: A | Discharge status: Home / Self Care | Payer: Medicaid Other | Source: Ambulatory Visit | Attending: Obstetrics & Gynecology | Admitting: Obstetrics & Gynecology

## 2011-05-27 ENCOUNTER — Encounter (HOSPITAL_COMMUNITY): Payer: Self-pay | Admitting: *Deleted

## 2011-05-27 VITALS — BP 121/83 | Temp 97.1°F | Wt 174.7 lb

## 2011-05-27 DIAGNOSIS — O10019 Pre-existing essential hypertension complicating pregnancy, unspecified trimester: Secondary | ICD-10-CM

## 2011-05-27 DIAGNOSIS — O24919 Unspecified diabetes mellitus in pregnancy, unspecified trimester: Secondary | ICD-10-CM | POA: Insufficient documentation

## 2011-05-27 DIAGNOSIS — O169 Unspecified maternal hypertension, unspecified trimester: Secondary | ICD-10-CM | POA: Insufficient documentation

## 2011-05-27 DIAGNOSIS — E860 Dehydration: Secondary | ICD-10-CM | POA: Insufficient documentation

## 2011-05-27 DIAGNOSIS — O212 Late vomiting of pregnancy: Principal | ICD-10-CM | POA: Insufficient documentation

## 2011-05-27 DIAGNOSIS — O211 Hyperemesis gravidarum with metabolic disturbance: Secondary | ICD-10-CM | POA: Insufficient documentation

## 2011-05-27 DIAGNOSIS — E119 Type 2 diabetes mellitus without complications: Secondary | ICD-10-CM | POA: Insufficient documentation

## 2011-05-27 DIAGNOSIS — O10919 Unspecified pre-existing hypertension complicating pregnancy, unspecified trimester: Secondary | ICD-10-CM

## 2011-05-27 LAB — POCT URINALYSIS DIP (DEVICE)
Bilirubin Urine: NEGATIVE
Glucose, UA: NEGATIVE mg/dL
Hgb urine dipstick: NEGATIVE
Nitrite: NEGATIVE
Urobilinogen, UA: 0.2 mg/dL (ref 0.0–1.0)

## 2011-05-27 MED ORDER — SUCRALFATE 1 GM/10ML PO SUSP
1.0000 g | Freq: Four times a day (QID) | ORAL | Status: DC
  Filled 2011-05-27 (×5): qty 10

## 2011-05-27 MED ORDER — ACETAMINOPHEN 325 MG PO TABS: 650.0000 mg | ORAL_TABLET | ORAL | Status: DC | PRN

## 2011-05-27 MED ORDER — FLINTSTONES COMPLETE 60 MG PO CHEW: 2.0000 | CHEWABLE_TABLET | Freq: Every day | ORAL | Status: DC

## 2011-05-27 MED ORDER — INSULIN ASPART 100 UNIT/ML ~~LOC~~ SOLN: 10.0000 [IU] | Freq: Three times a day (TID) | SUBCUTANEOUS | Status: DC

## 2011-05-27 MED ORDER — LIDOCAINE HCL 1 % IJ SOLN
INTRAMUSCULAR | Status: AC
  Filled 2011-05-27: qty 20

## 2011-05-27 MED ORDER — ALBUTEROL SULFATE HFA 108 (90 BASE) MCG/ACT IN AERS: 2.0000 | INHALATION_SPRAY | Freq: Four times a day (QID) | RESPIRATORY_TRACT | Status: DC | PRN

## 2011-05-27 MED ORDER — ZOLPIDEM TARTRATE 10 MG PO TABS: 10.0000 mg | ORAL_TABLET | Freq: Every evening | ORAL | Status: DC | PRN

## 2011-05-27 MED ORDER — CALCIUM CARBONATE ANTACID 500 MG PO CHEW: 2.0000 | CHEWABLE_TABLET | ORAL | Status: DC | PRN

## 2011-05-27 MED ORDER — SODIUM CHLORIDE 0.9 % IV SOLN
25.0000 mg | Freq: Once | INTRAVENOUS | Status: AC
  Administered 2011-05-27: 25 mg via INTRAVENOUS
  Filled 2011-05-27: qty 1

## 2011-05-27 MED ORDER — INSULIN NPH (HUMAN) (ISOPHANE) 100 UNIT/ML ~~LOC~~ SUSP: 5.0000 [IU] | Freq: Two times a day (BID) | SUBCUTANEOUS | Status: DC

## 2011-05-27 MED ORDER — DOCUSATE SODIUM 100 MG PO CAPS: 100.0000 mg | ORAL_CAPSULE | Freq: Every day | ORAL | Status: DC

## 2011-05-27 MED ORDER — ONDANSETRON HCL 4 MG/2ML IJ SOLN
4.0000 mg | Freq: Once | INTRAMUSCULAR | Status: AC
  Administered 2011-05-27: 4 mg via INTRAVENOUS
  Filled 2011-05-27: qty 2

## 2011-05-27 MED ORDER — AMITRIPTYLINE HCL 25 MG PO TABS: 25.0000 mg | ORAL_TABLET | Freq: Every day | ORAL | Status: DC

## 2011-05-27 MED ORDER — HYDROXYZINE HCL 25 MG PO TABS
25.0000 mg | ORAL_TABLET | Freq: Three times a day (TID) | ORAL | Status: DC | PRN
  Filled 2011-05-27: qty 1

## 2011-05-27 MED ORDER — GI COCKTAIL ~~LOC~~
30.0000 mL | Freq: Once | ORAL | Status: AC
  Administered 2011-05-27: 30 mL via ORAL
  Filled 2011-05-27: qty 30

## 2011-05-27 MED ORDER — ACETAMINOPHEN 500 MG PO TABS: 500.0000 mg | ORAL_TABLET | Freq: Four times a day (QID) | ORAL | Status: DC | PRN

## 2011-05-27 MED ORDER — LOPERAMIDE HCL 2 MG PO TABS: 2.0000 mg | ORAL_TABLET | Freq: Four times a day (QID) | ORAL | Status: AC | PRN

## 2011-05-27 MED ORDER — LACTATED RINGERS IV BOLUS (SEPSIS)
1000.0000 mL | Freq: Once | INTRAVENOUS | Status: AC
  Administered 2011-05-27: 1000 mL via INTRAVENOUS

## 2011-05-27 MED ORDER — SODIUM CHLORIDE 0.9 % IJ SOLN: 10.0000 mL | INTRAMUSCULAR | Status: DC | PRN

## 2011-05-27 MED ORDER — PRENATAL MULTIVITAMIN CH: 1.0000 | ORAL_TABLET | Freq: Every day | ORAL | Status: DC

## 2011-05-27 MED ORDER — PANTOPRAZOLE SODIUM 40 MG PO TBEC
40.0000 mg | DELAYED_RELEASE_TABLET | Freq: Every day | ORAL | Status: DC
  Administered 2011-05-27: 40 mg via ORAL
  Filled 2011-05-27 (×2): qty 1

## 2011-05-27 MED ORDER — DIPHENHYDRAMINE HCL 25 MG PO TABS
25.0000 mg | ORAL_TABLET | Freq: Four times a day (QID) | ORAL | Status: DC | PRN
  Filled 2011-05-27: qty 1

## 2011-05-27 NOTE — Discharge Instructions (Signed)
Peripherally Inserted Central Catheter (PICC)  Home Guide  A peripherally inserted central catheter (PICC) is a long, thin, flexible tube that is inserted into a vein in the upper arm. It is a form of intravenous (IV) access. It is considered to be a "central" line because the tip of the PICC ends in a large vein in your chest. This large vein is called the superior vena cava (SVC). The PICC tip ends in the SVC because there is a lot of blood flow in the SVC. This allows medicines and IV fluids to be quickly distributed throughout the body. The PICC is inserted using a sterile technique by a specially trained nurse or physician. After the PICC is inserted, a chest X-ray is done to be sure it is in the correct place.   A PICC may be placed for different reasons, such as:   To give medicines and liquid nutrition that can only be given through a central line. Examples are:   Certain antibiotic treatments.   Chemotherapy.   Total parenteral nutrition (TPN).   To take frequent blood samples.   To give IV fluids and blood products.   If there is difficulty placing a peripheral intravenous (PIV) catheter.  If taken care of properly, a PICC can remain in place for several months. A PICC can also allow patients to go home early. Medicine and PICC care can be managed at home by a family member or home healthcare team.  RISKS AND COMPLICATIONS  Possible problems with a PICC can occasionally occur. This may include:   A clot (thrombus) forming in or at the tip of the PICC. This can cause the PICC to become clogged. A "clot-busting" medicine called tissue plasminogen activator (tPA) can be inserted into the PICC to help break up the clot.   Inflammation of the vein (phlebitis) in which the PICC is placed. Signs of inflammation may include redness, pain at the insertion site, red streaks, or being able to feel a "cord" in the vein where the PICC is located.   Infection in the PICC or at the insertion site. Signs of  infection may include fever, chills, redness, swelling, or pus drainage from the PICC insertion site.   PICC movement (malposition). The PICC tip may migrate from its original position due to excessive physical activity, forceful coughing, sneezing, or vomiting.   A break or cut in the PICC. It is important to not use scissors near the PICC.   Nerve or tendon irritation or injury during PICC insertion.  HOME CARE INSTRUCTIONS  Activity   You may bend your arm and move it freely. If your PICC is near or at the bend of your elbow, avoid activity with repeated motion at the elbow.   Avoid lifting heavy objects as instructed by your caregiver.   Avoid using a crutch with the arm on the same side as your PICC. You may need to use a walker.  PICC Dressing   Keep your PICC bandage (dressing) clean and dry to prevent infection.   Ask your caregiver when you may shower. Ask your caregiver to teach you how to wrap the PICC when you do take a shower.   Do not bathe, swim, or use hot tubs when you have a PICC.   Change the PICC dressing as instructed by your caregiver.   Change your PICC dressing if it becomes loose or wet.  General PICC Care   Check the PICC insertion site daily for leakage, redness, swelling,   or pain.   Flush the PICC as directed by your caregiver. Let your caregiver know right away if the PICC is difficult to flush or does not flush. Do not use force to flush the PICC.   Do not use a syringe that is less than 10 mLs to flush the PICC.   Never pull or tug on the PICC.   Avoid blood pressure checks on the arm with the PICC.   Keep your PICC identification card with you at all times.   Do not take the PICC out yourself. Only a trained clinical professional should remove the PICC.  SEEK IMMEDIATE MEDICAL CARE IF:   Your PICC is accidently pulled all the way out. If this happens, cover the insertion site with a bandage or gauze dressing. Do not throw the PICC away. Your caregiver will need to  inspect it.   Your PICC was tugged or pulled and has partially come out. Do not  push the PICC back in.   There is any type of drainage, redness, or swelling where the PICC enters the skin.   You cannot flush the PICC, it is difficult to flush, or the PICC leaks around the insertion site when it is flushed.   You hear a "flushing" sound when the PICC is flushed.   You have pain, discomfort, or numbness in your arm, shoulder, or jaw on the same side as the PICC .   You feel your heart "racing" or skipping beats.   You notice a hole or tear in the PICC.   You develop chills or a fever.  MAKE SURE YOU:    Understand these instructions.   Will watch your condition.   Will get help right away if you are not doing well or get worse.  Document Released: 07/11/2002 Document Revised: 12/24/2010 Document Reviewed: 05/11/2010  [employer] Patient Information 2012 [employer].

## 2011-05-27 NOTE — Plan of Care (Signed)
Pt returned from [facility]- c/o nausea, indigestion

## 2011-05-27 NOTE — Discharge Summary (Signed)
Physician Discharge Summary  Patient ID: [name] MRN: [id number] DOB/AGE: Jun 20, 1980 31 y.o.  Admit date: 05/27/2011 Discharge date: 05/27/2011  Admission Diagnoses: 1.  IUP at 35.5 weeks  2. Hyperemesis gravidum  Discharge Diagnoses:  Same  Discharged Condition: good  Hospital Course: Patient admitted for hyperemesis gravidum and PICC line placement.  Patient was transported to [facility], were PICC line placed by Interventional Radiology without difficulty.  Patient returned to [facility], were she received 2L IV fluids and was discharged in a stable condition.  Consults: IR  Significant Diagnostic Studies: none  Treatments: IV hydration  Discharge Exam: Blood pressure 118/78, pulse 98, temperature 97.8 F (36.6 C), temperature source Axillary, resp. rate 20, height 5\' 6"  (1.676 m), weight 78.926 kg (174 lb), last menstrual period 09/19/2010. Unchanged from admission  Disposition: 01-Home or Self Care  Discharge Orders    Future Appointments: Provider: Department: Dept Phone: Center:   05/31/2011 3:00 PM [facility] [phone] [place]   06/03/2011 7:30 AM [facility] [phone] [facility]     Medication List  As of 05/27/2011  7:42 PM   STOP taking these medications         diphenhydrAMINE 25 MG tablet         TAKE these medications         albuterol 108 (90 BASE) MCG/ACT inhaler   Commonly known as: PROVENTIL HFA;VENTOLIN HFA   Inhale 2 puffs into the lungs every 6 (six) hours as needed. For shortness of breath      flintstones complete 60 MG chewable tablet   Chew 2 tablets by mouth daily.      insulin aspart 100 UNIT/ML injection   Commonly known as: novoLOG   Inject 10 Units into the skin 3 (three) times daily before meals.      insulin NPH 100 UNIT/ML injection   Commonly known as: HUMULIN N,NOVOLIN N   Inject 5 Units into the skin 2 (two) times daily.      omeprazole 40 MG capsule   Commonly  known as: PRILOSEC   Take 1 capsule (40 mg total) by mouth daily.           Follow-up Information    Follow up with [facility] on 05/31/2011.   Contact information:   [place]          Signed: [place] JEHIEL 05/27/2011, 7:42 PM

## 2011-05-27 NOTE — H&P (Signed)
[name] is a 31 y.o. female presenting for observation for dehydration.  History This is a 31 yo G4P1112 at 35.5 weeks who was seen in the clinic earlier today.  She has been receiving IV fluids at home for hyperemesis gravidum until the past 3-4 days.  Her peripheral line had been removed due to non-functioning and could not be replaced.  She was directly admitted for line placement and hydration.  She denies fevers, chills, decreased fetal activity, contractions, abdominal pain.    OB History    Grav Para Term Preterm Abortions TAB SAB Ect Mult Living   4 2 1 1 1  0 0 1 0 2     Past Medical History  Diagnosis Date  . Asthma   . Diabetes mellitus   . Mental disorder   . Depression   . Anxiety   . Hypertension   . Hyperemesis arising during pregnancy    Past Surgical History  Procedure Date  . No past surgeries    Family History: family history includes Diabetes in her sister and Heart disease in her mother.  There is no history of Anesthesia problems. Social History:  reports that she has never smoked. She has never used smokeless tobacco. She reports that she does not drink alcohol or use illicit drugs.  ROS    Height 5\' 6"  (1.676 m), weight 78.926 kg (174 lb), last menstrual period 09/19/2010. Exam Physical Exam  Constitutional: She is oriented to person, place, and time. She appears well-developed and well-nourished.  HENT:  Head: Normocephalic and atraumatic.  GI: Soft. She exhibits no distension and no mass. There is no tenderness. There is no rebound and no guarding.  Neurological: She is alert and oriented to person, place, and time.  Skin: Skin is warm and dry.  Psychiatric: She has a normal mood and affect. Her behavior is normal. Judgment and thought content normal.    NST: category 1 tracing with baseline of 140s.  No contractions seen.  Prenatal labs: ABO, Rh: --/--/A POS, A POS (05/02 1120) Antibody: NEG (05/02 1120) Rubella: 5.5 (11/20 0845) RPR: NON  REAC (11/20 0845)  HBsAg: NEGATIVE (11/20 0845)  HIV: NON REACTIVE (11/20 0845)  GBS:     Assessment/Plan: 1.  IUP 35.5 weeks 2.  Dehydration 3.  Hyperemesis 4.  DM2 5.  HTN  Will consult IV team for midline/PICC placement.  Will give phenergan infusion.  Diet as tolerated.  Dispo: pending fluid rehydration.   [name] JEHIEL 05/27/2011, 2:30 PM

## 2011-05-27 NOTE — Progress Notes (Signed)
Pulse- 108  Edema- left arm due to IV,  IV removed 05/26/11 Pressure- "bottom of stomach" Pt c/o diarrhea x 3 days and have not taken anything, continues with headaches, dizziness, N/V Pt states is "not leaving this facility w/o something being done about the way I am feeling" Pt brought in a note from [facility] indicating the need for a PICC line due to the issues of having difficulty placing IV.

## 2011-05-27 NOTE — Progress Notes (Signed)
Cannot maintain IV access for IVF, home health requests PICC line, as does patient. No IVF for last 1-2 days, not eating. NST today reactive. Will send for midline in antenatal

## 2011-05-27 NOTE — Patient Instructions (Signed)
Gestational Diabetes Mellitus Gestational diabetes mellitus (GDM) is diabetes that occurs only during pregnancy. This happens when the body cannot properly handle the glucose (sugar) that increases in the blood after eating. During pregnancy, insulin resistance (reduced sensitivity to insulin) occurs because of the release of hormones from the placenta. Usually, the pancreas of pregnant women produces enough insulin to overcome the resistance that occurs. However, in gestational diabetes, the insulin is there but it does not work effectively. If the resistance is severe enough that the pancreas does not produce enough insulin, extra glucose builds up in the blood.  WHO IS AT RISK FOR DEVELOPING GESTATIONAL DIABETES?  Women with a history of diabetes in the family.   Women over age 67.   Women who are overweight.   Women in certain ethnic groups (Hispanic, African American, Native American, [place] and [place]).  WHAT CAN HAPPEN TO THE BABY? If the mother's blood glucose is too high while she is pregnant, the extra sugar will travel through the umbilical cord to the baby. Some of the problems the baby may have are:  Large Baby - If the baby receives too much sugar, the baby will gain more weight. This may cause the baby to be too large to be born normally (vaginally) and a Cesarean section (C-section) may be needed.   Low Blood Glucose (hypoglycemia) - The baby makes extra insulin, in response to the extra sugar its gets from its mother. When the baby is born and no longer needs this extra insulin, the baby's blood glucose level may drop.   Jaundice (yellow coloring of the skin and eyes) - This is fairly common in babies. It is caused from a build-up of the chemical called bilirubin. This is rarely serious, but is seen more often in babies whose mothers had gestational diabetes.  RISKS TO THE MOTHER Women who have had gestational diabetes may be at higher risk for some problems,  including:  Preeclampsia or toxemia, which includes problems with high blood pressure. Blood pressure and protein levels in the urine must be checked frequently.   Infections.   Cesarean section (C-section) for delivery.   Developing Type 2 diabetes later in life. About 30-50% will develop diabetes later, especially if obese.  DIAGNOSIS  The hormones that cause insulin resistance are highest at about 24-28 weeks of pregnancy. If symptoms are experienced, they are much like symptoms you would normally expect during pregnancy.  GDM is often diagnosed using a two part method: 1. After 24-28 weeks of pregnancy, the woman drinks a glucose solution and takes a blood test. If the glucose level is high, a second test will be given.  2. Oral Glucose Tolerance Test (OGTT) which is 3 hours long - After not eating overnight, the blood glucose is checked. The woman drinks a glucose solution, and hourly blood glucose tests are taken.  If the woman has risk factors for GDM, the caregiver may test earlier than 24 weeks of pregnancy. TREATMENT  Treatment of GDM is directed at keeping the mother's blood glucose level normal, and may include:  Meal planning.   Taking insulin or other medicine to control your blood glucose level.   Exercise.   Keeping a daily record of the foods you eat.   Blood glucose monitoring and keeping a record of your blood glucose levels.   May monitor ketone levels in the urine, although this is no longer considered necessary in most pregnancies.  HOME CARE INSTRUCTIONS  While you are pregnant:  Follow your caregiver's advice regarding your prenatal appointments, meal planning, exercise, medicines, vitamins, blood and other tests, and physical activities.   Keep a record of your meals, blood glucose tests, and the amount of insulin you are taking (if any). Show this to your caregiver at every prenatal visit.   If you have GDM, you may have problems with hypoglycemia (low  blood glucose). You may suspect this if you become suddenly dizzy, feel shaky, and/or weak. If you think this is happening and you have a glucose meter, try to test your blood glucose level. Follow your caregiver's advice for when and how to treat your low blood glucose. Generally, the 15:15 rule is followed: Treat by consuming 15 grams of carbohydrates, wait 15 minutes, and recheck blood glucose. Examples of 15 grams of carbohydrates are:   1 cup skim or low-fat milk.    cup juice.   3-4 glucose tablets.   5-6 hard candies.   1 small box raisins.    cup regular soda pop.   Practice good hygiene, to avoid infections.   Do not smoke.  SEEK MEDICAL CARE IF:   You develop abnormal vaginal discharge, with or without itching.   You become weak and tired more than expected.   You seem to sweat a lot.   You have a sudden increase in weight, 5 pounds or more in one week.   You are losing weight, 3 pounds or more in a week.   Your blood glucose level is high, and you need instructions on what to do about it.  SEEK IMMEDIATE MEDICAL CARE IF:   You develop a severe headache.   You faint or pass out.   You develop nausea and vomiting.   You become disoriented or confused.   You have a convulsion.   You develop vision problems.   You develop stomach pain.   You develop vaginal bleeding.   You develop uterine contractions.   You have leaking or a gush of fluid from the vagina.  AFTER YOU HAVE THE BABY:  Go to all of your follow-up appointments, and have blood tests as advised by your caregiver.   Maintain a healthy lifestyle, to prevent diabetes in the future. This includes:   Following a healthy meal plan.   Controlling your weight.   Getting enough exercise and proper rest.   Do not smoke.   Breastfeed your baby if you can. This will lower the chance of you and your baby developing diabetes later in life.  For more information about diabetes, go to the American  Diabetes Association at: NiceStrategy.no. For more information about gestational diabetes, go to the [employer] of [employer]st[employer]ians and [employer]nec[employer] at: RepublicForum.gl. Document Released: 04/12/2000 Document Revised: 12/24/2010 Document Reviewed: 11/04/2008 [facility] Patient Information 2012 [place], [place].

## 2011-05-27 NOTE — Progress Notes (Signed)
Pt states she only takes her insulin if she eats and she is not able to eat often.

## 2011-05-28 NOTE — Progress Notes (Signed)
UR Chart review completed.  

## 2011-05-30 ENCOUNTER — Emergency Department (HOSPITAL_COMMUNITY)
Admission: EM | Admit: 2011-05-30 | Discharge: 2011-05-30 | Disposition: A | Discharge status: Home / Self Care | Payer: Medicaid Other | Attending: Emergency Medicine | Admitting: Emergency Medicine

## 2011-05-30 ENCOUNTER — Encounter (HOSPITAL_COMMUNITY): Payer: Self-pay | Admitting: *Deleted

## 2011-05-30 DIAGNOSIS — Z794 Long term (current) use of insulin: Secondary | ICD-10-CM | POA: Insufficient documentation

## 2011-05-30 DIAGNOSIS — J45909 Unspecified asthma, uncomplicated: Secondary | ICD-10-CM | POA: Insufficient documentation

## 2011-05-30 DIAGNOSIS — T82838A Hemorrhage of vascular prosthetic devices, implants and grafts, initial encounter: Secondary | ICD-10-CM

## 2011-05-30 DIAGNOSIS — E119 Type 2 diabetes mellitus without complications: Secondary | ICD-10-CM | POA: Insufficient documentation

## 2011-05-30 DIAGNOSIS — I10 Essential (primary) hypertension: Secondary | ICD-10-CM | POA: Insufficient documentation

## 2011-05-30 DIAGNOSIS — T82898A Other specified complication of vascular prosthetic devices, implants and grafts, initial encounter: Secondary | ICD-10-CM | POA: Insufficient documentation

## 2011-05-30 DIAGNOSIS — Z452 Encounter for adjustment and management of vascular access device: Secondary | ICD-10-CM

## 2011-05-30 NOTE — ED Notes (Signed)
EMS called to home.  Found patient ambulatory.  She was complaining of her PICC line leaking blood.  V/S.  She denies any pain or nausea.

## 2011-05-30 NOTE — Discharge Instructions (Signed)
Peripherally Inserted Central Catheter (PICC)  Home Guide  A peripherally inserted central catheter (PICC) is a long, thin, flexible tube that is inserted into a vein in the upper arm. It is a form of intravenous (IV) access. It is considered to be a "central" line because the tip of the PICC ends in a large vein in your chest. This large vein is called the superior vena cava (SVC). The PICC tip ends in the SVC because there is a lot of blood flow in the SVC. This allows medicines and IV fluids to be quickly distributed throughout the body. The PICC is inserted using a sterile technique by a specially trained nurse or physician. After the PICC is inserted, a chest X-ray is done to be sure it is in the correct place.   A PICC may be placed for different reasons, such as:   To give medicines and liquid nutrition that can only be given through a central line. Examples are:   Certain antibiotic treatments.   Chemotherapy.   Total parenteral nutrition (TPN).   To take frequent blood samples.   To give IV fluids and blood products.   If there is difficulty placing a peripheral intravenous (PIV) catheter.  If taken care of properly, a PICC can remain in place for several months. A PICC can also allow patients to go home early. Medicine and PICC care can be managed at home by a family member or home healthcare team.  RISKS AND COMPLICATIONS  Possible problems with a PICC can occasionally occur. This may include:   A clot (thrombus) forming in or at the tip of the PICC. This can cause the PICC to become clogged. A "clot-busting" medicine called tissue plasminogen activator (tPA) can be inserted into the PICC to help break up the clot.   Inflammation of the vein (phlebitis) in which the PICC is placed. Signs of inflammation may include redness, pain at the insertion site, red streaks, or being able to feel a "cord" in the vein where the PICC is located.   Infection in the PICC or at the insertion site. Signs of  infection may include fever, chills, redness, swelling, or pus drainage from the PICC insertion site.   PICC movement (malposition). The PICC tip may migrate from its original position due to excessive physical activity, forceful coughing, sneezing, or vomiting.   A break or cut in the PICC. It is important to not use scissors near the PICC.   Nerve or tendon irritation or injury during PICC insertion.  HOME CARE INSTRUCTIONS  Activity   You may bend your arm and move it freely. If your PICC is near or at the bend of your elbow, avoid activity with repeated motion at the elbow.   Avoid lifting heavy objects as instructed by your caregiver.   Avoid using a crutch with the arm on the same side as your PICC. You may need to use a walker.  PICC Dressing   Keep your PICC bandage (dressing) clean and dry to prevent infection.   Ask your caregiver when you may shower. Ask your caregiver to teach you how to wrap the PICC when you do take a shower.   Do not bathe, swim, or use hot tubs when you have a PICC.   Change the PICC dressing as instructed by your caregiver.   Change your PICC dressing if it becomes loose or wet.  General PICC Care   Check the PICC insertion site daily for leakage, redness, swelling,   or pain.   Flush the PICC as directed by your caregiver. Let your caregiver know right away if the PICC is difficult to flush or does not flush. Do not use force to flush the PICC.   Do not use a syringe that is less than 10 mLs to flush the PICC.   Never pull or tug on the PICC.   Avoid blood pressure checks on the arm with the PICC.   Keep your PICC identification card with you at all times.   Do not take the PICC out yourself. Only a trained clinical professional should remove the PICC.  SEEK IMMEDIATE MEDICAL CARE IF:   Your PICC is accidently pulled all the way out. If this happens, cover the insertion site with a bandage or gauze dressing. Do not throw the PICC away. Your caregiver will need to  inspect it.   Your PICC was tugged or pulled and has partially come out. Do not  push the PICC back in.   There is any type of drainage, redness, or swelling where the PICC enters the skin.   You cannot flush the PICC, it is difficult to flush, or the PICC leaks around the insertion site when it is flushed.   You hear a "flushing" sound when the PICC is flushed.   You have pain, discomfort, or numbness in your arm, shoulder, or jaw on the same side as the PICC .   You feel your heart "racing" or skipping beats.   You notice a hole or tear in the PICC.   You develop chills or a fever.  MAKE SURE YOU:    Understand these instructions.   Will watch your condition.   Will get help right away if you are not doing well or get worse.  Document Released: 07/11/2002 Document Revised: 12/24/2010 Document Reviewed: 05/11/2010  [employer] Patient Information 2012 [employer].

## 2011-05-30 NOTE — ED Notes (Signed)
Patient is alert and oriented x3.  Patient PICC line dressing was changed.  She was not showing any drainage from the site at the time of changing.  No redness, swelling, pain or edema noted at PICC line site.  V/S stable.  Patient was given DC instructions and  Follow up care for PICC line site.  She was not showing any signs of distress at the time of DC

## 2011-05-30 NOTE — ED Provider Notes (Signed)
History    31yf with bleeding from PICC. Recently placed for hyperemesis. Noticed small amount of blood near bandage today. No pain. No other complaints. Pt asking for crackers and something to drink.  CSN: [id number]  Arrival date & time 05/30/11  0847   First MD Initiated Contact with Patient 05/30/11 [phone]      Chief Complaint  Patient presents with  . Vascular Access Problem    leaking PICC line    (Consider location/radiation/quality/duration/timing/severity/associated sxs/prior treatment) HPI  Past Medical History  Diagnosis Date  . Asthma   . Diabetes mellitus   . Mental disorder   . Depression   . Anxiety   . Hypertension   . Hyperemesis arising during pregnancy     Past Surgical History  Procedure Date  . No past surgeries     Family History  Problem Relation Age of Onset  . Heart disease Mother     CHF  . Anesthesia problems Neg Hx   . Diabetes Sister     History  Substance Use Topics  . Smoking status: Never Smoker   . Smokeless tobacco: Never Used  . Alcohol Use: No     not with preg    OB History    Grav Para Term Preterm Abortions TAB SAB Ect Mult Living   4 2 1 1 1  0 0 1 0 2      Review of Systems   Review of symptoms negative unless otherwise noted in HPI.   Allergies  Dilaudid and Morphine and related  Home Medications   Current Outpatient Rx  Name Route Sig Dispense Refill  . ALBUTEROL SULFATE HFA 108 (90 BASE) MCG/ACT IN AERS Inhalation Inhale 2 puffs into the lungs every 6 (six) hours as needed. For shortness of breath    . FLINTSTONES COMPLETE 60 MG PO CHEW Oral Chew 2 tablets by mouth daily.    . INSULIN ASPART 100 UNIT/ML [place] SOLN Subcutaneous Inject 10 Units into the skin 3 (three) times daily before meals. 1 vial 12  . INSULIN ISOPHANE HUMAN 100 UNIT/ML [place] SUSP Subcutaneous Inject 5 Units into the skin 2 (two) times daily. 1 vial 12  . LOPERAMIDE HCL 2 MG PO TABS Oral Take 1 tablet (2 mg total) by mouth 4 (four) times  daily as needed for diarrhea or loose stools. 15 tablet 0  . OMEPRAZOLE 40 MG PO CPDR Oral Take 1 capsule (40 mg total) by mouth daily. 30 capsule 5    BP 123/90  Pulse 104  Resp 16  SpO2 100%  LMP 09/19/2010  Physical Exam  Nursing note and vitals reviewed. Constitutional: She appears well-developed and well-nourished. No distress.  HENT:  Head: Normocephalic and atraumatic.  Eyes: Conjunctivae are normal. Right eye exhibits no discharge. Left eye exhibits no discharge.  Neck: Neck supple.  Cardiovascular: Normal rate, regular rhythm and normal heart sounds.  Exam reveals no gallop and no friction rub.   No murmur heard.      PICC RUE. Dressing in place with minimal amount of blood noted under dressing. Line sutured into place and intact. No active bleeding noted. No significant amount of line exposed. No drainage.   Pulmonary/Chest: Effort normal and breath sounds normal. No respiratory distress.  Abdominal: Soft. She exhibits no distension. There is no tenderness.  Musculoskeletal: She exhibits no edema and no tenderness.  Neurological: She is alert.  Skin: Skin is warm and dry.  Psychiatric: She has a normal mood and affect. Her behavior is  normal. Thought content normal.    ED Course  Procedures (including critical care time)  Labs Reviewed - No data to display No results found.   1. PICC (peripherally inserted central catheter) in place   2. Bleeding from PICC line       MDM  31 year old female with minimal bleeding from PICC. Both ports were both withdrawn and flushed easily by myself. No pain with this. Dressing is intact but with minimal amount of bleeding where the catheter inserts into the skin. Sutures are in place. Catheter is in appropriate position. Do not feel that imaging is needed at this time. Dressing changed. Return precautions discussed.       Virgel Manifold, MD 06/03/11 0700

## 2011-05-31 ENCOUNTER — Ambulatory Visit (INDEPENDENT_AMBULATORY_CARE_PROVIDER_SITE_OTHER): Payer: Medicaid Other | Admitting: *Deleted

## 2011-05-31 VITALS — BP 101/68 | Wt 183.3 lb

## 2011-05-31 DIAGNOSIS — O24919 Unspecified diabetes mellitus in pregnancy, unspecified trimester: Secondary | ICD-10-CM

## 2011-05-31 DIAGNOSIS — O10919 Unspecified pre-existing hypertension complicating pregnancy, unspecified trimester: Secondary | ICD-10-CM

## 2011-05-31 DIAGNOSIS — O10019 Pre-existing essential hypertension complicating pregnancy, unspecified trimester: Secondary | ICD-10-CM

## 2011-05-31 NOTE — Progress Notes (Signed)
NST performed on 05/31/2011 was reviewed and was found to be reactive.  Continue recommended antenatal testing and prenatal care.

## 2011-05-31 NOTE — Progress Notes (Signed)
P = 125   Pt expressed concerns about PICC line- site evaluated by Dr. Nehemiah Settle and is WNL- small amt of old blood observed @ insertion site under dressing.- no sx of infiltration. Pt reports that she administered 1L IV fluids via PICC line this am and had no problems.

## 2011-06-03 ENCOUNTER — Ambulatory Visit (INDEPENDENT_AMBULATORY_CARE_PROVIDER_SITE_OTHER): Payer: Medicaid Other | Admitting: Obstetrics & Gynecology

## 2011-06-03 VITALS — BP 112/78 | Wt 177.6 lb

## 2011-06-03 DIAGNOSIS — O24919 Unspecified diabetes mellitus in pregnancy, unspecified trimester: Secondary | ICD-10-CM

## 2011-06-03 DIAGNOSIS — O0993 Supervision of high risk pregnancy, unspecified, third trimester: Secondary | ICD-10-CM

## 2011-06-03 DIAGNOSIS — O10019 Pre-existing essential hypertension complicating pregnancy, unspecified trimester: Secondary | ICD-10-CM

## 2011-06-03 LAB — POCT URINALYSIS DIP (DEVICE)
Bilirubin Urine: NEGATIVE
Hgb urine dipstick: NEGATIVE
Nitrite: NEGATIVE
Protein, ur: 30 mg/dL — AB
pH: 7 (ref 5.0–8.0)

## 2011-06-03 LAB — GLUCOSE, CAPILLARY: Glucose-Capillary: 96 mg/dL (ref 70–99)

## 2011-06-03 NOTE — Progress Notes (Signed)
P = 103   Pt reports swelling and slight pain @ PICC insertion site.  Ob US for growth scheduled on 5/23 @ 0815.   IOL on 6/1 @ 0700

## 2011-06-03 NOTE — Progress Notes (Signed)
Hass PICC line in place.  Nontender, no erythema, no evidence of infection.  Resolving ecchymosis from recent insertion.  Pt needs GBS and cervical cultures.  Fastings and postprandial are nml per pt but she did not bring CBG log.  Fasting this morning in clinic is 96. Nml growth in April, next growth is 06/10/11.  In twice weekly testing.  Today's NST is Reactive.

## 2011-06-03 NOTE — Patient Instructions (Signed)
Breastfeeding BENEFITS OF BREASTFEEDING For the baby  The first milk (colostrum) helps the baby's digestive system function better.   There are antibodies from the mother in the milk that help the baby fight off infections.   The baby has a lower incidence of asthma, allergies, and SIDS (sudden infant death syndrome).   The nutrients in breast milk are better than formulas for the baby and helps the baby's brain grow better.   Babies who breastfeed have less gas, colic, and constipation.  For the mother  Breastfeeding helps develop a very special bond between mother and baby.   It is more convenient, always available at the correct temperature and cheaper than formula feeding.   It burns calories in the mother and helps with losing weight that was gained during pregnancy.   It makes the uterus contract back down to normal size faster and slows bleeding following delivery.   Breastfeeding mothers have a lower risk of developing breast cancer.  NURSE FREQUENTLY  A healthy, full-term baby may breastfeed as often as every hour or space his or her feedings to every 3 hours.   How often to nurse will vary from baby to baby. Watch your baby for signs of hunger, not the clock.   Nurse as often as the baby requests, or when you feel the need to reduce the fullness of your breasts.   Awaken the baby if it has been 3 to 4 hours since the last feeding.   Frequent feeding will help the mother make more milk and will prevent problems like sore nipples and engorgement of the breasts.  BABY'S POSITION AT THE BREAST  Whether lying down or sitting, be sure that the baby's tummy is facing your tummy.   Support the breast with 4 fingers underneath the breast and the thumb above. Make sure your fingers are well away from the nipple and baby's mouth.   Stroke the baby's lips and cheek closest to the breast gently with your finger or nipple.   When the baby's mouth is open wide enough, place all  of your nipple and as much of the dark area around the nipple as possible into your baby's mouth.   Pull the baby in close so the tip of the nose and the baby's cheeks touch the breast during the feeding.  FEEDINGS  The length of each feeding varies from baby to baby and from feeding to feeding.   The baby must suck about 2 to 3 minutes for your milk to get to him or her. This is called a "let down." For this reason, allow the baby to feed on each breast as long as he or she wants. Your baby will end the feeding when he or she has received the right balance of nutrients.   To break the suction, put your finger into the corner of the baby's mouth and slide it between his or her gums before removing your breast from his or her mouth. This will help prevent sore nipples.  REDUCING BREAST ENGORGEMENT  In the first week after your baby is born, you may experience signs of breast engorgement. When breasts are engorged, they feel heavy, warm, full, and may be tender to the touch. You can reduce engorgement if you:   Nurse frequently, every 2 to 3 hours. Mothers who breastfeed early and often have fewer problems with engorgement.   Place light ice packs on your breasts between feedings. This reduces swelling. Wrap the ice packs in a  lightweight towel to protect your skin.   Apply moist hot packs to your breast for 5 to 10 minutes before each feeding. This increases circulation and helps the milk flow.   Gently massage your breast before and during the feeding.   Make sure that the baby empties at least one breast at every feeding before switching sides.   Use a breast pump to empty the breasts if your baby is sleepy or not nursing well. You may also want to pump if you are returning to work or or you feel you are getting engorged.   Avoid bottle feeds, pacifiers or supplemental feedings of water or juice in place of breastfeeding.   Be sure the baby is latched on and positioned properly while  breastfeeding.   Prevent fatigue, stress, and anemia.   Wear a supportive bra, avoiding underwire styles.   Eat a balanced diet with enough fluids.  If you follow these suggestions, your engorgement should improve in 24 to 48 hours. If you are still experiencing difficulty, call your lactation consultant or caregiver. IS MY BABY GETTING ENOUGH MILK? Sometimes, mothers worry about whether their babies are getting enough milk. You can be assured that your baby is getting enough milk if:  The baby is actively sucking and you hear swallowing.   The baby nurses at least 8 to 12 times in a 24 hour time period. Nurse your baby until he or she unlatches or falls asleep at the first breast (at least 10 to 20 minutes), then offer the second side.   The baby is wetting 5 to 6 disposable diapers (6 to 8 cloth diapers) in a 24 hour period by 25 to 26 days of age.   The baby is having at least 2 to 3 stools every 24 hours for the first few months. Breast milk is all the food your baby needs. It is not necessary for your baby to have water or formula. In fact, to help your breasts make more milk, it is best not to give your baby supplemental feedings during the early weeks.   The stool should be soft and yellow.   The baby should gain 4 to 7 ounces per week after he is 41 days old.  TAKE CARE OF YOURSELF Take care of your breasts by:  Bathing or showering daily.   Avoiding the use of soaps on your nipples.   Start feedings on your left breast at one feeding and on your right breast at the next feeding.   You will notice an increase in your milk supply 2 to 5 days after delivery. You may feel some discomfort from engorgement, which makes your breasts very firm and often tender. Engorgement "peaks" out within 24 to 48 hours. In the meantime, apply warm moist towels to your breasts for 5 to 10 minutes before feeding. Gentle massage and expression of some milk before feeding will soften your breasts, making  it easier for your baby to latch on. Wear a well fitting nursing bra and air dry your nipples for 10 to 15 minutes after each feeding.   Only use cotton bra pads.   Only use pure lanolin on your nipples after nursing. You do not need to wash it off before nursing.  Take care of yourself by:   Eating well-balanced meals and nutritious snacks.   Drinking milk, fruit juice, and water to satisfy your thirst (about 8 glasses a day).   Getting plenty of rest.   Increasing calcium in  your diet (1200 mg a day).   Avoiding foods that you notice affect the baby in a bad way.  SEEK MEDICAL CARE IF:   You have any questions or difficulty with breastfeeding.   You need help.   You have a hard, red, sore area on your breast, accompanied by a fever of 100.5 F (38.1 C) or more.   Your baby is too sleepy to eat well or is having trouble sleeping.   Your baby is wetting less than 6 diapers per day, by 64 days of age.   Your baby's skin or white part of his or her eyes is more yellow than it was in the hospital.   You feel depressed.  Document Released: 01/04/2005 Document Revised: 12/24/2010 Document Reviewed: 08/19/2008 [facility] Patient Information 2012 [place].

## 2011-06-04 ENCOUNTER — Emergency Department (HOSPITAL_COMMUNITY)
Admission: EM | Admit: 2011-06-04 | Discharge: 2011-06-04 | Disposition: A | Discharge status: Home / Self Care | Payer: Medicaid Other | Attending: Emergency Medicine | Admitting: Emergency Medicine

## 2011-06-04 ENCOUNTER — Encounter (HOSPITAL_COMMUNITY): Payer: Self-pay

## 2011-06-04 DIAGNOSIS — Y849 Medical procedure, unspecified as the cause of abnormal reaction of the patient, or of later complication, without mention of misadventure at the time of the procedure: Secondary | ICD-10-CM | POA: Insufficient documentation

## 2011-06-04 DIAGNOSIS — F329 Major depressive disorder, single episode, unspecified: Secondary | ICD-10-CM | POA: Insufficient documentation

## 2011-06-04 DIAGNOSIS — E119 Type 2 diabetes mellitus without complications: Secondary | ICD-10-CM | POA: Insufficient documentation

## 2011-06-04 DIAGNOSIS — R109 Unspecified abdominal pain: Secondary | ICD-10-CM | POA: Insufficient documentation

## 2011-06-04 DIAGNOSIS — F3289 Other specified depressive episodes: Secondary | ICD-10-CM | POA: Insufficient documentation

## 2011-06-04 DIAGNOSIS — R1110 Vomiting, unspecified: Secondary | ICD-10-CM | POA: Insufficient documentation

## 2011-06-04 DIAGNOSIS — J45909 Unspecified asthma, uncomplicated: Secondary | ICD-10-CM | POA: Insufficient documentation

## 2011-06-04 DIAGNOSIS — F411 Generalized anxiety disorder: Secondary | ICD-10-CM | POA: Insufficient documentation

## 2011-06-04 DIAGNOSIS — T82898A Other specified complication of vascular prosthetic devices, implants and grafts, initial encounter: Secondary | ICD-10-CM | POA: Insufficient documentation

## 2011-06-04 LAB — DIFFERENTIAL
Lymphocytes Relative: 30 % (ref 12–46)
Lymphs Abs: 1.4 10*3/uL (ref 0.7–4.0)
Monocytes Absolute: 0.4 10*3/uL (ref 0.1–1.0)
Monocytes Relative: 8 % (ref 3–12)
Neutro Abs: 2.8 10*3/uL (ref 1.7–7.7)

## 2011-06-04 LAB — CBC
HCT: 21.7 % — ABNORMAL LOW (ref 36.0–46.0)
Hemoglobin: 7.3 g/dL — ABNORMAL LOW (ref 12.0–15.0)
WBC: 4.7 10*3/uL (ref 4.0–10.5)

## 2011-06-04 LAB — BASIC METABOLIC PANEL
Chloride: 103 mEq/L (ref 96–112)
GFR calc Af Amer: 69 mL/min — ABNORMAL LOW (ref >90)
Potassium: 3.7 mEq/L (ref 3.5–5.1)

## 2011-06-04 LAB — GC/CHLAMYDIA PROBE AMP, GENITAL: GC Probe Amp, Genital: NEGATIVE

## 2011-06-04 MED ORDER — PANTOPRAZOLE SODIUM 20 MG PO TBEC
20.0000 mg | DELAYED_RELEASE_TABLET | Freq: Once | ORAL | Status: DC
  Filled 2011-06-04: qty 1

## 2011-06-04 MED ORDER — PROMETHAZINE HCL 25 MG/ML IJ SOLN
25.0000 mg | Freq: Once | INTRAMUSCULAR | Status: AC
  Administered 2011-06-04: 25 mg via INTRAMUSCULAR
  Filled 2011-06-04: qty 1

## 2011-06-04 MED ORDER — PANTOPRAZOLE SODIUM 40 MG PO TBEC
40.0000 mg | DELAYED_RELEASE_TABLET | Freq: Once | ORAL | Status: AC
  Administered 2011-06-04: 40 mg via ORAL

## 2011-06-04 MED ORDER — GI COCKTAIL ~~LOC~~
30.0000 mL | Freq: Once | ORAL | Status: AC
  Administered 2011-06-04: 30 mL via ORAL
  Filled 2011-06-04: qty 30

## 2011-06-04 MED ORDER — SODIUM CHLORIDE 0.9 % IV BOLUS (SEPSIS)
500.0000 mL | Freq: Once | INTRAVENOUS | Status: AC
  Administered 2011-06-04: 1000 mL via INTRAVENOUS

## 2011-06-04 MED ORDER — PANTOPRAZOLE SODIUM 40 MG PO TBEC
DELAYED_RELEASE_TABLET | ORAL | Status: AC
  Filled 2011-06-04: qty 1

## 2011-06-04 MED ORDER — HEPARIN SOD (PORK) LOCK FLUSH 100 UNIT/ML IV SOLN
250.0000 [IU] | INTRAVENOUS | Status: AC | PRN
  Administered 2011-06-04: 500 [IU]

## 2011-06-04 NOTE — Discharge Instructions (Signed)
Followup with your doctor as needed

## 2011-06-04 NOTE — ED Provider Notes (Signed)
History     CSN: [id number]  Arrival date & time 06/04/11  [id number]   First MD Initiated Contact with Patient 06/04/11 [phone]      Chief Complaint  Patient presents with  . Vascular Access Problem    (Consider location/radiation/quality/duration/timing/severity/associated sxs/prior treatment) The history is provided by the patient.   the patient is a 31 year old, female, who is at 11 weeks estimated gestational age.  She presents emergency department complaining of uncontrolled nausea and vomiting and clots to her PICC line.  She also has indigestion.  She says that she feels as if she is going to faint.  She states that she still feels her baby moving in her uterus  Past Medical History  Diagnosis Date  . Asthma   . Diabetes mellitus   . Mental disorder   . Depression   . Anxiety   . Hypertension   . Hyperemesis arising during pregnancy     Past Surgical History  Procedure Date  . No past surgeries     Family History  Problem Relation Age of Onset  . Heart disease Mother     CHF  . Anesthesia problems Neg Hx   . Diabetes Sister     History  Substance Use Topics  . Smoking status: Never Smoker   . Smokeless tobacco: Never Used  . Alcohol Use: No     not with preg    OB History    Grav Para Term Preterm Abortions TAB SAB Ect Mult Living   4 2 1 1 1  0 0 1 0 2      Review of Systems  Gastrointestinal: Positive for nausea, vomiting and abdominal pain.  All other systems reviewed and are negative.    Allergies  Dilaudid and Morphine and related  Home Medications   Current Outpatient Rx  Name Route Sig Dispense Refill  . ALBUTEROL SULFATE HFA 108 (90 BASE) MCG/ACT IN AERS Inhalation Inhale 2 puffs into the lungs every 6 (six) hours as needed. For shortness of breath    . FLINTSTONES COMPLETE 60 MG PO CHEW Oral Chew 2 tablets by mouth daily.    . INSULIN ASPART 100 UNIT/ML [place] SOLN Subcutaneous Inject 10 Units into the skin 3 (three) times daily before meals.  1 vial 12  . INSULIN ISOPHANE HUMAN 100 UNIT/ML [place] SUSP Subcutaneous Inject 5 Units into the skin 2 (two) times daily. 1 vial 12  . LOPERAMIDE HCL 2 MG PO TABS Oral Take 1 tablet (2 mg total) by mouth 4 (four) times daily as needed for diarrhea or loose stools. 15 tablet 0  . OMEPRAZOLE 40 MG PO CPDR Oral Take 1 capsule (40 mg total) by mouth daily. 30 capsule 5  . PRESCRIPTION MEDICATION Intravenous Inject 25 mg into the vein every 12 (twelve) hours. Phenergan      BP 115/62  Pulse 91  Temp(Src) 97.6 F (36.4 C) (Oral)  Resp 16  SpO2 100%  LMP 09/19/2010  Physical Exam  Vitals reviewed. Constitutional: She is oriented to person, place, and time. She appears well-developed and well-nourished. No distress.  HENT:  Head: Normocephalic and atraumatic.  Eyes: Conjunctivae are normal.  Neck: Normal range of motion. Neck supple.  Cardiovascular: Normal rate.   No murmur heard. Pulmonary/Chest: Effort normal. No respiratory distress.  Abdominal:       Gravid  Musculoskeletal: Normal range of motion.  Neurological: She is alert and oriented to person, place, and time.  Skin: Skin is warm and dry.  Psychiatric:  She has a normal mood and affect. Thought content normal.    ED Course  Procedures (including critical care time)   Labs Reviewed  BASIC METABOLIC PANEL  CBC  DIFFERENTIAL   No results found.   No diagnosis found.    MDM  N/v Gravid sxs resolved in ed.   picc line fixed        Barbara Cower, MD 06/04/11 1445

## 2011-06-04 NOTE — ED Notes (Signed)
Per EMS, pt from home, pt  reports her PICC line is clotted. Pt uses PICC line at home for for IV fluids and Phenergan IVP, pt is [redacted] weeks pregnant, refused transportation to women's, nad

## 2011-06-04 NOTE — Progress Notes (Signed)
Asked to eval (R)UE PICC line as it is not working. Pt had dual lumen PICC placed a few weeks ago for Home IVF and Phenergan. She reports 2 days ago, the purple port stopped working, and yesterday the red port stopped. She reports she has only ever flushed it with NS, never heparin saline.  Use of guidewire and 6mL syringe with NS and Hep Saline to successfully flush each lumen.  Red port flushes much easier than purple and would preferentially use red port.  Recommend Hep saline in-dwell between uses. Have written orders for that and immediate use. Recommend infusion of IVF before DC from ER to ensure function.  [facility] 06/04/2011 12:18 PM

## 2011-06-04 NOTE — ED Notes (Signed)
Pt reports her PICC line clotted 2 days, reports her purple lumen clotted 2 days ago and red lumen clotted yesterday. Pt receives NS and Phenergan IVP through her PICC line. Pt reports her due date is 06/26/2011, but she may be induced 06/19/2011

## 2011-06-04 NOTE — ED Notes (Signed)
Pt informed IV team has been paged to access her PICC line

## 2011-06-04 NOTE — ED Notes (Signed)
Patient denies pain and is resting comfortably.  

## 2011-06-07 ENCOUNTER — Other Ambulatory Visit: Payer: Medicaid Other

## 2011-06-08 ENCOUNTER — Telehealth (HOSPITAL_COMMUNITY): Payer: Self-pay | Admitting: *Deleted

## 2011-06-08 NOTE — Telephone Encounter (Signed)
Preadmission screen  

## 2011-06-10 ENCOUNTER — Encounter: Payer: Self-pay | Admitting: Obstetrics & Gynecology

## 2011-06-10 ENCOUNTER — Ambulatory Visit (INDEPENDENT_AMBULATORY_CARE_PROVIDER_SITE_OTHER): Payer: Medicaid Other | Admitting: Obstetrics and Gynecology

## 2011-06-10 ENCOUNTER — Ambulatory Visit (HOSPITAL_COMMUNITY)
Admission: RE | Admit: 2011-06-10 | Discharge: 2011-06-10 | Disposition: A | Discharge status: Home / Self Care | Payer: Medicaid Other | Source: Ambulatory Visit | Attending: Obstetrics and Gynecology | Admitting: Obstetrics and Gynecology

## 2011-06-10 ENCOUNTER — Other Ambulatory Visit: Payer: Self-pay | Admitting: Obstetrics and Gynecology

## 2011-06-10 ENCOUNTER — Ambulatory Visit (HOSPITAL_COMMUNITY)
Admission: RE | Admit: 2011-06-10 | Discharge: 2011-06-10 | Disposition: A | Discharge status: Home / Self Care | Payer: Medicaid Other | Source: Ambulatory Visit | Attending: Obstetrics & Gynecology | Admitting: Obstetrics & Gynecology

## 2011-06-10 ENCOUNTER — Other Ambulatory Visit: Payer: Self-pay | Admitting: *Deleted

## 2011-06-10 VITALS — BP 119/81 | HR 89 | Resp 15

## 2011-06-10 VITALS — BP 125/88 | Temp 96.7°F | Wt 180.5 lb

## 2011-06-10 DIAGNOSIS — O210 Mild hyperemesis gravidarum: Secondary | ICD-10-CM

## 2011-06-10 DIAGNOSIS — O24919 Unspecified diabetes mellitus in pregnancy, unspecified trimester: Secondary | ICD-10-CM

## 2011-06-10 DIAGNOSIS — O365990 Maternal care for other known or suspected poor fetal growth, unspecified trimester, not applicable or unspecified: Secondary | ICD-10-CM

## 2011-06-10 DIAGNOSIS — O10019 Pre-existing essential hypertension complicating pregnancy, unspecified trimester: Secondary | ICD-10-CM

## 2011-06-10 DIAGNOSIS — T82598A Other mechanical complication of other cardiac and vascular devices and implants, initial encounter: Secondary | ICD-10-CM | POA: Insufficient documentation

## 2011-06-10 DIAGNOSIS — Z8751 Personal history of pre-term labor: Secondary | ICD-10-CM | POA: Insufficient documentation

## 2011-06-10 DIAGNOSIS — O10919 Unspecified pre-existing hypertension complicating pregnancy, unspecified trimester: Secondary | ICD-10-CM

## 2011-06-10 DIAGNOSIS — Y849 Medical procedure, unspecified as the cause of abnormal reaction of the patient, or of later complication, without mention of misadventure at the time of the procedure: Secondary | ICD-10-CM | POA: Insufficient documentation

## 2011-06-10 DIAGNOSIS — O212 Late vomiting of pregnancy: Secondary | ICD-10-CM | POA: Insufficient documentation

## 2011-06-10 LAB — POCT URINALYSIS DIP (DEVICE)
Bilirubin Urine: NEGATIVE
Hgb urine dipstick: NEGATIVE
Ketones, ur: NEGATIVE mg/dL
Specific Gravity, Urine: 1.015 (ref 1.005–1.030)
pH: 7 (ref 5.0–8.0)

## 2011-06-10 MED ORDER — CHLORHEXIDINE GLUCONATE 4 % EX LIQD
CUTANEOUS | Status: AC
  Filled 2011-06-10: qty 30

## 2011-06-10 NOTE — Progress Notes (Signed)
Pain-"bottom of stomach" Pt c/o picc line "one side doesn't flush the other side don't work"  Pulse- 92

## 2011-06-10 NOTE — Discharge Summary (Deleted)
  Patient to IR for PICC exchange due to malfunctioning PICC.  This was exchanged without difficulty.  Patient discharged to home with follow up with OB/GYN as scheduled.

## 2011-06-10 NOTE — Procedures (Signed)
PICC exchange due to malfunctioning PICC RT DL PICC exchange measuring 41 cm in length with tip at cavo-atrial junction.  Patient shielded across back and abdomen during procedure.   Fluoro time 1.0 minute

## 2011-06-10 NOTE — Progress Notes (Signed)
NST reviewed and reactive. Patient reports persistent nausea and emesis last night. Patient states home health has not been able to administer IV fluid secondary to the picc line not functionning. Contacted [facility] IR department who can have the picc line replaced today. FM/labor precautions reviewed. Patient scheduled for IOL on 6/1 at 7am.

## 2011-06-10 NOTE — Discharge Instructions (Signed)
Peripherally Inserted Central Catheter (PICC)  Home Guide  A peripherally inserted central catheter (PICC) is a long, thin, flexible tube that is inserted into a vein in the upper arm. It is a form of intravenous (IV) access. It is considered to be a "central" line because the tip of the PICC ends in a large vein in your chest. This large vein is called the superior vena cava (SVC). The PICC tip ends in the SVC because there is a lot of blood flow in the SVC. This allows medicines and IV fluids to be quickly distributed throughout the body. The PICC is inserted using a sterile technique by a specially trained nurse or physician. After the PICC is inserted, a chest X-ray is done to be sure it is in the correct place.   A PICC may be placed for different reasons, such as:   To give medicines and liquid nutrition that can only be given through a central line. Examples are:   Certain antibiotic treatments.   Chemotherapy.   Total parenteral nutrition (TPN).   To take frequent blood samples.   To give IV fluids and blood products.   If there is difficulty placing a peripheral intravenous (PIV) catheter.  If taken care of properly, a PICC can remain in place for several months. A PICC can also allow patients to go home early. Medicine and PICC care can be managed at home by a family member or home healthcare team.  RISKS AND COMPLICATIONS  Possible problems with a PICC can occasionally occur. This may include:   A clot (thrombus) forming in or at the tip of the PICC. This can cause the PICC to become clogged. A "clot-busting" medicine called tissue plasminogen activator (tPA) can be inserted into the PICC to help break up the clot.   Inflammation of the vein (phlebitis) in which the PICC is placed. Signs of inflammation may include redness, pain at the insertion site, red streaks, or being able to feel a "cord" in the vein where the PICC is located.   Infection in the PICC or at the insertion site. Signs of  infection may include fever, chills, redness, swelling, or pus drainage from the PICC insertion site.   PICC movement (malposition). The PICC tip may migrate from its original position due to excessive physical activity, forceful coughing, sneezing, or vomiting.   A break or cut in the PICC. It is important to not use scissors near the PICC.   Nerve or tendon irritation or injury during PICC insertion.  HOME CARE INSTRUCTIONS  Activity   You may bend your arm and move it freely. If your PICC is near or at the bend of your elbow, avoid activity with repeated motion at the elbow.   Avoid lifting heavy objects as instructed by your caregiver.   Avoid using a crutch with the arm on the same side as your PICC. You may need to use a walker.  PICC Dressing   Keep your PICC bandage (dressing) clean and dry to prevent infection.   Ask your caregiver when you may shower. Ask your caregiver to teach you how to wrap the PICC when you do take a shower.   Do not bathe, swim, or use hot tubs when you have a PICC.   Change the PICC dressing as instructed by your caregiver.   Change your PICC dressing if it becomes loose or wet.  General PICC Care   Check the PICC insertion site daily for leakage, redness, swelling,   or pain.   Flush the PICC as directed by your caregiver. Let your caregiver know right away if the PICC is difficult to flush or does not flush. Do not use force to flush the PICC.   Do not use a syringe that is less than 10 mLs to flush the PICC.   Never pull or tug on the PICC.   Avoid blood pressure checks on the arm with the PICC.   Keep your PICC identification card with you at all times.   Do not take the PICC out yourself. Only a trained clinical professional should remove the PICC.  SEEK IMMEDIATE MEDICAL CARE IF:   Your PICC is accidently pulled all the way out. If this happens, cover the insertion site with a bandage or gauze dressing. Do not throw the PICC away. Your caregiver will need to  inspect it.   Your PICC was tugged or pulled and has partially come out. Do not  push the PICC back in.   There is any type of drainage, redness, or swelling where the PICC enters the skin.   You cannot flush the PICC, it is difficult to flush, or the PICC leaks around the insertion site when it is flushed.   You hear a "flushing" sound when the PICC is flushed.   You have pain, discomfort, or numbness in your arm, shoulder, or jaw on the same side as the PICC .   You feel your heart "racing" or skipping beats.   You notice a hole or tear in the PICC.   You develop chills or a fever.  MAKE SURE YOU:    Understand these instructions.   Will watch your condition.   Will get help right away if you are not doing well or get worse.  Document Released: 07/11/2002 Document Revised: 12/24/2010 Document Reviewed: 05/11/2010  [employer] Patient Information 2012 [employer].

## 2011-06-14 ENCOUNTER — Encounter (HOSPITAL_COMMUNITY): Payer: Self-pay | Admitting: *Deleted

## 2011-06-14 ENCOUNTER — Inpatient Hospital Stay (HOSPITAL_COMMUNITY)
Admission: AD | Admit: 2011-06-14 | Discharge: 2011-06-16 | DRG: 782 | Disposition: A | Discharge status: Home / Self Care | Payer: Medicaid Other | Source: Ambulatory Visit | Attending: Obstetrics & Gynecology | Admitting: Obstetrics & Gynecology

## 2011-06-14 DIAGNOSIS — O10919 Unspecified pre-existing hypertension complicating pregnancy, unspecified trimester: Secondary | ICD-10-CM

## 2011-06-14 DIAGNOSIS — O139 Gestational [pregnancy-induced] hypertension without significant proteinuria, unspecified trimester: Principal | ICD-10-CM | POA: Diagnosis present

## 2011-06-14 DIAGNOSIS — O479 False labor, unspecified: Secondary | ICD-10-CM | POA: Diagnosis present

## 2011-06-14 DIAGNOSIS — E119 Type 2 diabetes mellitus without complications: Secondary | ICD-10-CM

## 2011-06-14 DIAGNOSIS — O210 Mild hyperemesis gravidarum: Secondary | ICD-10-CM

## 2011-06-14 LAB — URINALYSIS, ROUTINE W REFLEX MICROSCOPIC
Ketones, ur: NEGATIVE mg/dL
Leukocytes, UA: NEGATIVE
Nitrite: NEGATIVE
Protein, ur: 100 mg/dL — AB
Urobilinogen, UA: 1 mg/dL (ref 0.0–1.0)

## 2011-06-14 LAB — DIFFERENTIAL
Eosinophils Absolute: 0.1 10*3/uL (ref 0.0–0.7)
Lymphs Abs: 2.6 10*3/uL (ref 0.7–4.0)
Monocytes Absolute: 0.7 10*3/uL (ref 0.1–1.0)
Monocytes Relative: 9 % (ref 3–12)
Neutrophils Relative %: 52 % (ref 43–77)

## 2011-06-14 LAB — PROTEIN / CREATININE RATIO, URINE
Protein Creatinine Ratio: 0.31 — ABNORMAL HIGH (ref 0.00–0.15)
Total Protein, Urine: 157.9 mg/dL

## 2011-06-14 LAB — COMPREHENSIVE METABOLIC PANEL
Alkaline Phosphatase: 140 U/L — ABNORMAL HIGH (ref 39–117)
BUN: 13 mg/dL (ref 6–23)
Chloride: 100 mEq/L (ref 96–112)
GFR calc Af Amer: 57 mL/min — ABNORMAL LOW (ref >90)
Glucose, Bld: 86 mg/dL (ref 70–99)
Potassium: 3.7 mEq/L (ref 3.5–5.1)
Total Bilirubin: 0.3 mg/dL (ref 0.3–1.2)

## 2011-06-14 LAB — CBC
HCT: 23.3 % — ABNORMAL LOW (ref 36.0–46.0)
Hemoglobin: 7.6 g/dL — ABNORMAL LOW (ref 12.0–15.0)
MCH: 24.6 pg — ABNORMAL LOW (ref 26.0–34.0)
RBC: 3.09 MIL/uL — ABNORMAL LOW (ref 3.87–5.11)

## 2011-06-14 MED ORDER — LACTATED RINGERS IV BOLUS (SEPSIS)
1000.0000 mL | Freq: Once | INTRAVENOUS | Status: AC
  Administered 2011-06-14: 1000 mL via INTRAVENOUS

## 2011-06-14 MED ORDER — SODIUM CHLORIDE 0.9 % IJ SOLN
INTRAMUSCULAR | Status: AC
  Filled 2011-06-14: qty 3

## 2011-06-14 MED ORDER — CITRIC ACID-SODIUM CITRATE 334-500 MG/5ML PO SOLN
30.0000 mL | Freq: Once | ORAL | Status: AC
  Administered 2011-06-14: 30 mL via ORAL
  Filled 2011-06-14: qty 15

## 2011-06-14 MED ORDER — INSULIN NPH (HUMAN) (ISOPHANE) 100 UNIT/ML ~~LOC~~ SUSP
5.0000 [IU] | Freq: Every day | SUBCUTANEOUS | Status: DC
  Administered 2011-06-15 – 2011-06-16 (×2): 5 [IU] via SUBCUTANEOUS

## 2011-06-14 MED ORDER — DOCUSATE SODIUM 100 MG PO CAPS
100.0000 mg | ORAL_CAPSULE | Freq: Every day | ORAL | Status: DC
Start: 2011-06-15 — End: 2011-06-16
  Filled 2011-06-14: qty 1

## 2011-06-14 MED ORDER — ACETAMINOPHEN 325 MG PO TABS: 650.0000 mg | ORAL_TABLET | ORAL | Status: DC | PRN

## 2011-06-14 MED ORDER — LORAZEPAM 2 MG/ML IJ SOLN
0.5000 mg | Freq: Once | INTRAMUSCULAR | Status: DC
  Filled 2011-06-14: qty 1

## 2011-06-14 MED ORDER — INSULIN NPH (HUMAN) (ISOPHANE) 100 UNIT/ML ~~LOC~~ SUSP
5.0000 [IU] | Freq: Every day | SUBCUTANEOUS | Status: DC
  Administered 2011-06-15 (×2): 5 [IU] via SUBCUTANEOUS
  Filled 2011-06-14: qty 10

## 2011-06-14 MED ORDER — DEXTROSE IN LACTATED RINGERS 5 % IV SOLN
INTRAVENOUS | Status: DC
  Administered 2011-06-14 – 2011-06-15 (×3): via INTRAVENOUS
  Administered 2011-06-15: 125 mL/h via INTRAVENOUS
  Administered 2011-06-16: 07:00:00 via INTRAVENOUS

## 2011-06-14 MED ORDER — PRENATAL MULTIVITAMIN CH
1.0000 | ORAL_TABLET | Freq: Every day | ORAL | Status: DC
  Filled 2011-06-14: qty 1

## 2011-06-14 MED ORDER — CALCIUM CARBONATE ANTACID 500 MG PO CHEW: 2.0000 | CHEWABLE_TABLET | ORAL | Status: DC | PRN

## 2011-06-14 MED ORDER — LORAZEPAM 2 MG/ML IJ SOLN
1.0000 mg | Freq: Once | INTRAMUSCULAR | Status: AC
  Administered 2011-06-14: 1 mg via INTRAVENOUS

## 2011-06-14 MED ORDER — PROMETHAZINE HCL 25 MG/ML IJ SOLN
25.0000 mg | INTRAMUSCULAR | Status: DC | PRN
  Administered 2011-06-14 – 2011-06-16 (×6): 25 mg via INTRAVENOUS
  Filled 2011-06-14 (×7): qty 1

## 2011-06-14 MED ORDER — ZOLPIDEM TARTRATE 10 MG PO TABS
10.0000 mg | ORAL_TABLET | Freq: Every evening | ORAL | Status: DC | PRN
  Administered 2011-06-14 – 2011-06-15 (×2): 10 mg via ORAL
  Filled 2011-06-14 (×2): qty 1

## 2011-06-14 NOTE — MAU Note (Signed)
ems arrival, 38wks. With ongoing n/v.  Has PIC line for IV fluids and phenergan. abd pain- but does not feel like labor.

## 2011-06-14 NOTE — MAU Provider Note (Addendum)
History    CSN: [id number] Arrival date and time: 06/14/11 1510  First Provider Initiated Contact with Patient 06/14/11 1531    Chief Complaint  Patient presents with  . Nausea  . Emesis  . Abdominal Pain   Emesis  This is a chronic problem. The current episode started more than 1 month ago. The problem occurs more than 10 times per day. The problem has been gradually worsening. The emesis has an appearance of bright red blood and stomach contents. There has been no fever. Associated symptoms include abdominal pain. Pertinent negatives include no arthralgias, chest pain, chills, coughing, diarrhea, dizziness, fever, headaches, myalgias, sweats or weight loss. Treatments tried: PICC line for IV fluids and phenergan.  Has not had any since 5/21 as PICC was exchanged due to not working on that day.  Abdominal Pain Associated symptoms include vomiting. Pertinent negatives include no arthralgias, diarrhea, fever, headaches, myalgias or weight loss.   OB History    Grav Para Term Preterm Abortions TAB SAB Ect Mult Living   4 2 1 1 1  0 0 1 0 2      Past Medical History  Diagnosis Date  . Asthma   . Diabetes mellitus   . Mental disorder   . Depression   . Anxiety   . Hypertension   . Hyperemesis arising during pregnancy     Past Surgical History  Procedure Date  . No past surgeries     Family History  Problem Relation Age of Onset  . Heart disease Mother     CHF  . Anesthesia problems Neg Hx   . Diabetes Sister     History  Substance Use Topics  . Smoking status: Never Smoker   . Smokeless tobacco: Never Used  . Alcohol Use: No     not with preg    Allergies:  Allergies  Allergen Reactions  . Dilaudid (Hydromorphone Hcl) Nausea And Vomiting  . Morphine And Related Nausea And Vomiting    Prescriptions prior to admission  Medication Sig Dispense Refill  . albuterol (PROVENTIL HFA;VENTOLIN HFA) 108 (90 BASE) MCG/ACT inhaler Inhale 2 puffs into the lungs every 6  (six) hours as needed. For shortness of breath      . flintstones complete (FLINTSTONES) 60 MG chewable tablet Chew 2 tablets by mouth daily.      . insulin aspart (NOVOLOG) 100 UNIT/ML injection Inject 10 Units into the skin 3 (three) times daily before meals.  1 vial  12  . insulin NPH (HUMULIN N) 100 UNIT/ML injection Inject 5 Units into the skin 2 (two) times daily.  1 vial  12  . omeprazole (PRILOSEC) 40 MG capsule Take 1 capsule (40 mg total) by mouth daily.  30 capsule  5  . PRESCRIPTION MEDICATION Inject 25 mg into the vein every 12 (twelve) hours. Phenergan        Review of Systems  Constitutional: Negative for fever, chills and weight loss.  Respiratory: Negative for cough.   Cardiovascular: Negative for chest pain.  Gastrointestinal: Positive for vomiting and abdominal pain. Negative for diarrhea.  Musculoskeletal: Negative for myalgias and arthralgias.  Neurological: Negative for dizziness and headaches.  All other systems reviewed and are negative.   Physical Exam   Blood pressure 131/96, pulse 85, temperature 97.6 F (36.4 C), resp. rate 16, last menstrual period 09/19/2010.  Physical Exam  Constitutional: She is oriented to person, place, and time. She appears well-developed and well-nourished. She appears distressed.  HENT:  Head: Normocephalic and  atraumatic.       Dry mucous membranes   Neck: Normal range of motion. Neck supple.  Cardiovascular: Normal rate, regular rhythm, normal heart sounds and intact distal pulses.  Exam reveals no gallop and no friction rub.   No murmur heard. Respiratory: Effort normal and breath sounds normal. No respiratory distress. She has no wheezes. She has no rales. She exhibits no tenderness.  GI: Soft. Bowel sounds are normal. She exhibits distension and mass. There is tenderness. There is no rebound and no guarding.  Musculoskeletal: She exhibits no edema.  Neurological: She is alert and oriented to person, place, and time.  Skin:  Skin is warm and dry. No rash noted. She is not diaphoretic. No erythema. No pallor.  Psychiatric: Thought content normal.       Distressed; perseverating on "i'm dying, i'm dying, i'm dying"  States she's not leaving here how she came in.      MAU Course  Procedures  MDM Pt with chronic ongoing Hyperemesis.  Has not bolused via PICC in 4 days since having exchanged.  Will provide fluids and phenergan as home regimen.   Elevated BP while in MAU.  Will continue to monitor and check spot CR/PR ratio for concerns of mild pre-ecclampsia; only 1 reading in severe range.   Assessment and Plan  Per further workup.  Care transitioned to Hansel Feinstein CNM. If Pr:Cr elevated will consider induction as at term and continues to have significant N&V.   Gerda Diss, DO [facility] Resident - PGY-1 06/14/2011 4:03 PM

## 2011-06-14 NOTE — Progress Notes (Signed)
Reviewed notes and lab results.  Results for orders placed during the hospital encounter of 06/14/11 (from the past 24 hour(s))  URINALYSIS, ROUTINE W REFLEX MICROSCOPIC     Status: Abnormal   Collection Time   06/14/11  4:00 PM      Component Value Range   Color, Urine AMBER (*) YELLOW    APPearance CLOUDY (*) CLEAR    Specific Gravity, Urine 1.025  1.005 - 1.030    pH 6.5  5.0 - 8.0    Glucose, UA NEGATIVE  NEGATIVE (mg/dL)   Hgb urine dipstick NEGATIVE  NEGATIVE    Bilirubin Urine NEGATIVE  NEGATIVE    Ketones, ur NEGATIVE  NEGATIVE (mg/dL)   Protein, ur 100 (*) NEGATIVE (mg/dL)   Urobilinogen, UA 1.0  0.0 - 1.0 (mg/dL)   Nitrite NEGATIVE  NEGATIVE    Leukocytes, UA NEGATIVE  NEGATIVE   URINE MICROSCOPIC-ADD ON     Status: Abnormal   Collection Time   06/14/11  4:00 PM      Component Value Range   Squamous Epithelial / LPF FEW (*) RARE    WBC, UA 0-2  <3 (WBC/hpf)   Bacteria, UA FEW (*) RARE    Crystals CA OXALATE CRYSTALS (*) NEGATIVE    Urine-Other MUCOUS PRESENT    CBC     Status: Abnormal   Collection Time   06/14/11  4:12 PM      Component Value Range   WBC 7.0  4.0 - 10.5 (K/uL)   RBC 3.09 (*) 3.87 - 5.11 (MIL/uL)   Hemoglobin 7.6 (*) 12.0 - 15.0 (g/dL)   HCT 23.3 (*) 36.0 - 46.0 (%)   MCV 75.4 (*) 78.0 - 100.0 (fL)   MCH 24.6 (*) 26.0 - 34.0 (pg)   MCHC 32.6  30.0 - 36.0 (g/dL)   RDW 13.8  11.5 - 15.5 (%)   Platelets 244  150 - 400 (K/uL)  DIFFERENTIAL     Status: Normal   Collection Time   06/14/11  4:12 PM      Component Value Range   Neutrophils Relative 52  43 - 77 (%)   Neutro Abs 3.6  1.7 - 7.7 (K/uL)   Lymphocytes Relative 38  12 - 46 (%)   Lymphs Abs 2.6  0.7 - 4.0 (K/uL)   Monocytes Relative 9  3 - 12 (%)   Monocytes Absolute 0.7  0.1 - 1.0 (K/uL)   Eosinophils Relative 1  0 - 5 (%)   Eosinophils Absolute 0.1  0.0 - 0.7 (K/uL)   Basophils Relative 0  0 - 1 (%)   Basophils Absolute 0.0  0.0 - 0.1 (K/uL)  COMPREHENSIVE METABOLIC PANEL     Status:  Abnormal   Collection Time   06/14/11  4:12 PM      Component Value Range   Sodium 134 (*) 135 - 145 (mEq/L)   Potassium 3.7  3.5 - 5.1 (mEq/L)   Chloride 100  96 - 112 (mEq/L)   CO2 21  19 - 32 (mEq/L)   Glucose, Bld 86  70 - 99 (mg/dL)   BUN 13  6 - 23 (mg/dL)   Creatinine, Ser 1.38 (*) 0.50 - 1.10 (mg/dL)   Calcium 8.8  8.4 - 10.5 (mg/dL)   Total Protein 6.9  6.0 - 8.3 (g/dL)   Albumin 2.4 (*) 3.5 - 5.2 (g/dL)   AST 12  0 - 37 (U/L)   ALT 5  0 - 35 (U/L)   Alkaline Phosphatase 140 (*)  39 - 117 (U/L)   Total Bilirubin 0.3  0.3 - 1.2 (mg/dL)   GFR calc non Af Amer 50 (*) >90 (mL/min)   GFR calc Af Amer 57 (*) >90 (mL/min)   Discussed with Dr Elonda Husky in light of elevated BPs Will add Pr/Cr Ration

## 2011-06-14 NOTE — MAU Note (Signed)
Pt has been vomiting for two day and says she has not had any fluids since last Wednesday.  [facility] called and came by today but she was at her sisters house so she came by EMS again.

## 2011-06-15 ENCOUNTER — Telehealth (HOSPITAL_COMMUNITY): Payer: Self-pay | Admitting: *Deleted

## 2011-06-15 ENCOUNTER — Encounter (HOSPITAL_COMMUNITY): Payer: Self-pay | Admitting: *Deleted

## 2011-06-15 ENCOUNTER — Encounter (HOSPITAL_COMMUNITY): Payer: Self-pay | Admitting: Anesthesiology

## 2011-06-15 ENCOUNTER — Ambulatory Visit (HOSPITAL_COMMUNITY): Payer: Medicaid Other

## 2011-06-15 LAB — GLUCOSE, CAPILLARY
Glucose-Capillary: 107 mg/dL — ABNORMAL HIGH (ref 70–99)
Glucose-Capillary: 116 mg/dL — ABNORMAL HIGH (ref 70–99)
Glucose-Capillary: 121 mg/dL — ABNORMAL HIGH (ref 70–99)

## 2011-06-15 MED ORDER — SIMETHICONE 80 MG PO CHEW
80.0000 mg | CHEWABLE_TABLET | Freq: Four times a day (QID) | ORAL | Status: DC | PRN
  Administered 2011-06-15: 80 mg via ORAL

## 2011-06-15 MED ORDER — ACETAMINOPHEN 500 MG PO TABS
1000.0000 mg | ORAL_TABLET | Freq: Four times a day (QID) | ORAL | Status: DC | PRN
  Administered 2011-06-15: 1000 mg via ORAL
  Filled 2011-06-15: qty 2

## 2011-06-15 MED ORDER — DIPHENOXYLATE-ATROPINE 2.5-0.025 MG PO TABS
1.0000 | ORAL_TABLET | Freq: Two times a day (BID) | ORAL | Status: DC | PRN
  Administered 2011-06-15 – 2011-06-16 (×2): 1 via ORAL
  Filled 2011-06-15 (×2): qty 1

## 2011-06-15 MED ORDER — LORAZEPAM 2 MG/ML IJ SOLN
1.0000 mg | Freq: Once | INTRAMUSCULAR | Status: AC
  Administered 2011-06-15: 1 mg via INTRAVENOUS
  Filled 2011-06-15: qty 1

## 2011-06-15 NOTE — Progress Notes (Signed)
Dr. Ileene Rubens notified of pt status, pt's anxiety and request for medication.  Will continue to monitor.

## 2011-06-15 NOTE — Telephone Encounter (Signed)
Preadmission screen  

## 2011-06-15 NOTE — Anesthesia Preprocedure Evaluation (Signed)
Anesthesia Evaluation Anesthesia Physical Anesthesia Plan  ASA: II  Anesthesia Plan: Epidural   Post-op Pain Management:    Induction:   Airway Management Planned:   Additional Equipment:   Intra-op Plan:   Post-operative Plan:   Informed Consent:   Plan Discussed with:   Anesthesia Plan Comments:         Anesthesia Quick Evaluation

## 2011-06-15 NOTE — Progress Notes (Signed)
Dr. Paulla Fore notified of pt status, medications given, and pt request to go on a wheelchair ride.  Orders received, will continue to monitor.

## 2011-06-15 NOTE — Progress Notes (Signed)
Pt request to go visit a friend in room #122.  Pt request to walk.  RN educated pt on medications that she had been given and that she was unsteady to walk on her own.  Pt request to go see her friend in a wheelchair.  Dr. Paulla Fore notified and ordered pt could go in a wheelchair accompanied by a nurse tech for her whole visit.

## 2011-06-15 NOTE — Progress Notes (Signed)
Settled into bed.  Still sleepy.  FHR was reactive on tracing with rare contractions.  No edema Normal reflexes.  24 hr urine in process.   Continue to observe

## 2011-06-15 NOTE — Progress Notes (Signed)
Patient ID: [name], female   DOB: Jul 12, 1980, 31 y.o.   MRN: [id number]  Sleeping at intervals.  FHR was reactive at last monitoring session. Rare contractions.  Cervix deferred.  Ext :  No edema          DTRs 3+/0 clonus  Results for orders placed during the hospital encounter of 06/14/11 (from the past 24 hour(s))  URINALYSIS, ROUTINE W REFLEX MICROSCOPIC     Status: Abnormal   Collection Time   06/14/11  4:00 PM      Component Value Range   Color, Urine AMBER (*) YELLOW    APPearance CLOUDY (*) CLEAR    Specific Gravity, Urine 1.025  1.005 - 1.030    pH 6.5  5.0 - 8.0    Glucose, UA NEGATIVE  NEGATIVE (mg/dL)   Hgb urine dipstick NEGATIVE  NEGATIVE    Bilirubin Urine NEGATIVE  NEGATIVE    Ketones, ur NEGATIVE  NEGATIVE (mg/dL)   Protein, ur 100 (*) NEGATIVE (mg/dL)   Urobilinogen, UA 1.0  0.0 - 1.0 (mg/dL)   Nitrite NEGATIVE  NEGATIVE    Leukocytes, UA NEGATIVE  NEGATIVE   URINE MICROSCOPIC-ADD ON     Status: Abnormal   Collection Time   06/14/11  4:00 PM      Component Value Range   Squamous Epithelial / LPF FEW (*) RARE    WBC, UA 0-2  <3 (WBC/hpf)   Bacteria, UA FEW (*) RARE    Crystals CA OXALATE CRYSTALS (*) NEGATIVE    Urine-Other MUCOUS PRESENT    PROTEIN / CREATININE RATIO, URINE     Status: Abnormal   Collection Time   06/14/11  4:00 PM      Component Value Range   Creatinine, Urine 509.75     Total Protein, Urine 157.9     PROTEIN CREATININE RATIO 0.31 (*) 0.00 - 0.15   CBC     Status: Abnormal   Collection Time   06/14/11  4:12 PM      Component Value Range   WBC 7.0  4.0 - 10.5 (K/uL)   RBC 3.09 (*) 3.87 - 5.11 (MIL/uL)   Hemoglobin 7.6 (*) 12.0 - 15.0 (g/dL)   HCT 23.3 (*) 36.0 - 46.0 (%)   MCV 75.4 (*) 78.0 - 100.0 (fL)   MCH 24.6 (*) 26.0 - 34.0 (pg)   MCHC 32.6  30.0 - 36.0 (g/dL)   RDW 13.8  11.5 - 15.5 (%)   Platelets 244  150 - 400 (K/uL)  DIFFERENTIAL     Status: Normal   Collection Time   06/14/11  4:12 PM      Component Value Range     Neutrophils Relative 52  43 - 77 (%)   Neutro Abs 3.6  1.7 - 7.7 (K/uL)   Lymphocytes Relative 38  12 - 46 (%)   Lymphs Abs 2.6  0.7 - 4.0 (K/uL)   Monocytes Relative 9  3 - 12 (%)   Monocytes Absolute 0.7  0.1 - 1.0 (K/uL)   Eosinophils Relative 1  0 - 5 (%)   Eosinophils Absolute 0.1  0.0 - 0.7 (K/uL)   Basophils Relative 0  0 - 1 (%)   Basophils Absolute 0.0  0.0 - 0.1 (K/uL)  COMPREHENSIVE METABOLIC PANEL     Status: Abnormal   Collection Time   06/14/11  4:12 PM      Component Value Range   Sodium 134 (*) 135 - 145 (mEq/L)   Potassium 3.7  3.5 - 5.1 (mEq/L)   Chloride 100  96 - 112 (mEq/L)   CO2 21  19 - 32 (mEq/L)   Glucose, Bld 86  70 - 99 (mg/dL)   BUN 13  6 - 23 (mg/dL)   Creatinine, Ser 1.38 (*) 0.50 - 1.10 (mg/dL)   Calcium 8.8  8.4 - 10.5 (mg/dL)   Total Protein 6.9  6.0 - 8.3 (g/dL)   Albumin 2.4 (*) 3.5 - 5.2 (g/dL)   AST 12  0 - 37 (U/L)   ALT 5  0 - 35 (U/L)   Alkaline Phosphatase 140 (*) 39 - 117 (U/L)   Total Bilirubin 0.3  0.3 - 1.2 (mg/dL)   GFR calc non Af Amer 50 (*) >90 (mL/min)   GFR calc Af Amer 57 (*) >90 (mL/min)  GLUCOSE, CAPILLARY     Status: Abnormal   Collection Time   06/15/11 12:31 AM      Component Value Range   Glucose-Capillary 121 (*) 70 - 99 (mg/dL)   Comment 1 Notify RN    24 hour Urine in process  Will continue to observe until urine collection complete.

## 2011-06-15 NOTE — MAU Provider Note (Signed)
Seen and examined by me. Will watch labs and if abnormal admit for obs. Per Dr Elonda Husky

## 2011-06-15 NOTE — Progress Notes (Signed)
[name], CNM notified of pt status, pt's pain and anxiety.  Orders received, will continue to monitor.

## 2011-06-15 NOTE — Progress Notes (Signed)
Pt went on a wheelchair ride accompanied by a nurse tech.

## 2011-06-16 LAB — CREATININE CLEARANCE, URINE, 24 HOUR
Collection Interval-CRCL: 24 hours
Creatinine, Urine: 90.45 mg/dL
Creatinine: 1.15 mg/dL — ABNORMAL HIGH (ref 0.50–1.10)
Urine Total Volume-CRCL: 1325 mL

## 2011-06-16 LAB — GLUCOSE, CAPILLARY
Glucose-Capillary: 94 mg/dL (ref 70–99)
Glucose-Capillary: 117 mg/dL — ABNORMAL HIGH (ref 70–99)

## 2011-06-16 LAB — PROTEIN, URINE, 24 HOUR: Collection Interval-UPROT: 24 hours

## 2011-06-16 LAB — CREATININE, SERUM: Creatinine, Ser: 1.15 mg/dL — ABNORMAL HIGH (ref 0.50–1.10)

## 2011-06-16 MED ORDER — HEPARIN SOD (PORK) LOCK FLUSH 100 UNIT/ML IV SOLN
250.0000 [IU] | INTRAVENOUS | Status: AC | PRN
  Administered 2011-06-16: 250 [IU]
  Filled 2011-06-16: qty 3

## 2011-06-16 MED ORDER — HEPARIN SOD (PORK) LOCK FLUSH 100 UNIT/ML IV SOLN
250.0000 [IU] | INTRAVENOUS | Status: AC | PRN
  Administered 2011-06-16: 250 [IU]

## 2011-06-16 NOTE — Discharge Instructions (Signed)
Hypertension During Pregnancy Hypertension is also called high blood pressure. Blood pressure moves blood in your body. Sometimes, the force that moves the blood becomes too strong. When you are pregnant, this condition should be watched carefully. It can cause problems for you and your baby. HOME CARE   Make and keep all of your doctor visits.   Take medicine as told by your doctor. Tell your doctor about all medicines you take.   Eat very little salt.   Exercise regularly.   Do not drink alcohol.   Do not smoke.   Do not have drinks with caffeine.   Lie on your left side when resting.  GET HELP RIGHT AWAY IF:  You have bad belly (abdominal) pain.   You have sudden puffiness (swelling) in the hands, ankles, or face.   You gain 4 pounds (1.8 kilograms) or more in 1 week.   You throw up (vomit) repeatedly.   You have bleeding from the vagina.   You do not feel the baby moving as much.   You have a headache.   You have blurred or double vision.   You have muscle twitching or spasms.   You have shortness of breath.   You have blue fingernails and lips.   You have blood in your pee (urine).  MAKE SURE YOU:  Understand these instructions.   Will watch your condition.   Will get help right away if you are not doing well.  Document Released: 02/06/2010 Document Revised: 12/24/2010 Document Reviewed: 08/21/2010 [facility] Patient Information 2012 [place].  Labor Induction  Most women go into labor on their own between 62 and 42 weeks of the pregnancy. When this does not happen or when there is a medical need, medicine or other methods may be used to induce labor. Labor induction causes a pregnant woman's uterus to contract. It also causes the cervix to soften (ripen), open (dilate), and thin out (efface). Usually, labor is not induced before 39 weeks of the pregnancy unless there is a problem with the baby or mother. Whether your labor will be induced depends on  a number of factors, including the following:  The medical condition of you and the baby.   How many weeks along you are.   The status of baby's lung maturity.   The condition of the cervix.   The position of the baby.  REASONS FOR LABOR INDUCTION  The health of the baby or mother is at risk.   The pregnancy is overdue by 1 week or more.   The water breaks but labor does not start on its own.   The mother has a health condition or serious illness such as high blood pressure, infection, placental abruption, or diabetes.   The amniotic fluid amounts are low around the baby.   The baby is distressed.  REASONS TO NOT INDUCE LABOR Labor induction may not be a good idea if:  It is shown that your baby does not tolerate labor.   An induction is just more convenient.   You want the baby to be born on a certain date, like a holiday.   You have had previous surgeries on your uterus, such as a myomectomy or the removal of fibroids.   Your placenta lies very low in the uterus and blocks the opening of the cervix (placenta previa).   Your baby is not in a head down position.   The umbilical cord drops down into the birth canal in front of the  baby. This could cut off the baby's blood and oxygen supply.   You have had a previous cesarean delivery.   There areunusual circumstances, such as the baby being extremely premature.  RISKS AND COMPLICATIONS Problems may occur in the process of induction and plans may need to be modified as a situation unfolds. Some of the risks of induction include:  Change in fetal heart rate, such as too high, too low, or erratic.   Risk of fetal distress.   Risk of infection to mother and baby.   Increased chance of having a cesarean delivery.   The rare, but increased chance that the placenta will separate from the uterus (abruption).   Uterine rupture (very rare).  When induction is needed for medical reasons, the benefits of induction may  outweigh the risks. BEFORE THE PROCEDURE Your caregiver will check your cervix and the baby's position. This will help your caregiver decide if you are far enough along for an induction to work. PROCEDURE Several methods of labor induction may be used, such as:   Taking prostaglandin medicine to dilate and ripen the cervix. The medicine will also start contractions. It can be taken by mouth or by inserting a suppository into the vagina.   A thin tube (catheter) with a balloon on the end may be inserted into your vagina to dilate the cervix. Once inserted, the balloon expands with water, which causes the cervix to open.   Striping the membranes. Your caregiver inserts a finger between the cervix and membranes, which causes the cervix to be stretched and may cause the uterus to contract. This is often done during an office visit. You will be sent home to wait for the contractions to begin. You will then come in for an induction.   Breaking the water. Your caregiver will make a hole in the amniotic sac using a small instrument. Once the amniotic sac breaks, contractions should begin. This may still take hours to see an effect.   Taking medicine to trigger or strengthen contractions. This medicine is given intravenously through a tube in your arm.  All of the methods of induction, besides stripping the membranes, will be done in the hospital. Induction is done in the hospital so that you and the baby can be carefully monitored. AFTER THE PROCEDURE Some inductions can take up to 2 or 3 days. Depending on the cervix, it usually takes less time. It takes longer when you are induced early in the pregnancy or if this is your first pregnancy. If a mother is still pregnant and the induction has been going on for 2 to 3 days, either the mother will be sent home or a cesarean delivery will be needed. Document Released: 05/26/2006 Document Revised: 12/24/2010 Document Reviewed: 11/09/2010 [facility] Patient  Information 2012 [place], [place].

## 2011-06-16 NOTE — Plan of Care (Signed)
Pt requesting cheeseburger, fries and ice cream from cafeteria.  Discussed CHO limit with pt- pt is negotiating diet order with service response.  Pt denies nausea or diarrhea at present.

## 2011-06-16 NOTE — Discharge Summary (Signed)
Agree with note,plan for induction

## 2011-06-16 NOTE — Discharge Summary (Signed)
Obstetric Discharge Summary Reason for Admission: N/V and elevated BP, admitted for observation Prenatal Procedures: NST and ultrasound  Pt denies h/a, epigastric pain, visual disturbances.  She reports ongoing nausea and vomiting, improved since hospital admission today and managed at home with medications via PICC line.      Hemoglobin  Date Value Range Status  06/14/2011 7.6* 12.0-15.0 (g/dL) Final     HCT  Date Value Range Status  06/14/2011 23.3* 36.0-46.0 (%) Final   Results for orders placed during the hospital encounter of 06/14/11 (from the past 24 hour(s))  GLUCOSE, CAPILLARY     Status: Abnormal   Collection Time   06/15/11 11:47 AM      Component Value Range   Glucose-Capillary 116 (*) 70 - 99 (mg/dL)  GLUCOSE, CAPILLARY     Status: Abnormal   Collection Time   06/15/11  3:36 PM      Component Value Range   Glucose-Capillary 133 (*) 70 - 99 (mg/dL)  GLUCOSE, CAPILLARY     Status: Abnormal   Collection Time   06/15/11  7:01 PM      Component Value Range   Glucose-Capillary 140 (*) 70 - 99 (mg/dL)  GLUCOSE, CAPILLARY     Status: Abnormal   Collection Time   06/15/11 10:04 PM      Component Value Range   Glucose-Capillary 161 (*) 70 - 99 (mg/dL)  CREATININE CLEARANCE, URINE, 24 HOUR     Status: Abnormal   Collection Time   06/15/11 10:55 PM      Component Value Range   Urine Total Volume-CRCL 1325     Collection Interval-CRCL 24     Creatinine, Urine 90.45     Creatinine 1.15 (*) 0.50 - 1.10 (mg/dL)   Creatinine, 24H Ur 1198  700 - 1800 (mg/day)   Creatinine Clearance 72 (*) 75 - 115 (mL/min)  PROTEIN, URINE, 24 HOUR     Status: Abnormal   Collection Time   06/15/11 10:55 PM      Component Value Range   Urine Total Volume-UPROT 1325     Collection Interval-UPROT 24     Protein, Urine 17     Protein, 24H Urine 225 (*) 50 - 100 (mg/day)  GLUCOSE, CAPILLARY     Status: Normal   Collection Time   06/16/11  5:19 AM      Component Value Range   Glucose-Capillary  94  70 - 99 (mg/dL)  CREATININE, SERUM     Status: Abnormal   Collection Time   06/16/11  5:20 AM      Component Value Range   Creatinine, Ser 1.15 (*) 0.50 - 1.10 (mg/dL)   GFR calc non Af Amer 62 (*) >90 (mL/min)   GFR calc Af Amer 72 (*) >90 (mL/min)  GLUCOSE, CAPILLARY     Status: Abnormal   Collection Time   06/16/11  8:18 AM      Component Value Range   Glucose-Capillary 117 (*) 70 - 99 (mg/dL)    Physical Exam:  Physical Examination: General appearance - alert, well appearing, and in no distress Chest - clear to auscultation, no wheezes, rales or rhonchi, symmetric air entry Heart - normal rate and regular rhythm Abdomen - soft, nontender, gravid Pelvic - examination not indicated Neurological - alert, oriented, normal speech, no focal findings or movement disorder noted Musculoskeletal - full range of motion without pain DTR--Normal, 2+ BLE  Discharge Diagnoses: Gestational hypertension  Discharge Information: Date: 06/16/2011 Activity: unrestricted Diet: Diabetic diet Medications:  See AVS Medication List  As of 06/16/2011 10:07 AM   TAKE these medications         albuterol 108 (90 BASE) MCG/ACT inhaler   Commonly known as: PROVENTIL HFA;VENTOLIN HFA   Inhale 2 puffs into the lungs every 6 (six) hours as needed. For shortness of breath      flintstones complete 60 MG chewable tablet   Chew 2 tablets by mouth daily.      insulin aspart 100 UNIT/ML injection   Commonly known as: novoLOG   Inject 10 Units into the skin 3 (three) times daily before meals.      insulin NPH 100 UNIT/ML injection   Commonly known as: HUMULIN N,NOVOLIN N   Inject 5 Units into the skin 2 (two) times daily.      omeprazole 40 MG capsule   Commonly known as: PRILOSEC   Take 1 capsule (40 mg total) by mouth daily.      PRESCRIPTION MEDICATION   Inject 25 mg into the vein every 12 (twelve) hours. Phenergan           Condition: stable Instructions: D/C home with preeclampsia and  labor precautions Discharge to: home  Discussed results and assessment with Dr Roselie Awkward, who reviewed labs and evaluated the pt prior to D/C.  She has IOL scheduled for 7 am on Saturday morning and this was reviewed with pt prior to D/C.  Follow-up Information    Please follow up. (Induction of labor scheduled for 7 am on Saturday, June 1.  Return to MAU as needed.)            LEFTWICH-KIRBY, Eldine Rencher 06/16/2011, 9:30 AM

## 2011-06-16 NOTE — Plan of Care (Signed)
Pt d/c home undelivered with instructions as per AVS; denies c/o; ambulatory- will go down to [place] to visit a friend, then will take bus home.  Voucher given per care mgmt.  Pt to return on Saturday 6/1 for IOL @ 0700.

## 2011-06-17 ENCOUNTER — Ambulatory Visit (INDEPENDENT_AMBULATORY_CARE_PROVIDER_SITE_OTHER): Payer: Medicaid Other | Admitting: Family Medicine

## 2011-06-17 VITALS — BP 133/94 | Temp 97.2°F | Wt 176.8 lb

## 2011-06-17 DIAGNOSIS — O10919 Unspecified pre-existing hypertension complicating pregnancy, unspecified trimester: Secondary | ICD-10-CM

## 2011-06-17 DIAGNOSIS — O10019 Pre-existing essential hypertension complicating pregnancy, unspecified trimester: Secondary | ICD-10-CM

## 2011-06-17 DIAGNOSIS — O24919 Unspecified diabetes mellitus in pregnancy, unspecified trimester: Secondary | ICD-10-CM

## 2011-06-17 LAB — COMPREHENSIVE METABOLIC PANEL
Albumin: 3.1 g/dL — ABNORMAL LOW (ref 3.5–5.2)
Alkaline Phosphatase: 141 U/L — ABNORMAL HIGH (ref 39–117)
BUN: 11 mg/dL (ref 6–23)
Calcium: 8.7 mg/dL (ref 8.4–10.5)
Creat: 1.24 mg/dL — ABNORMAL HIGH (ref 0.50–1.10)
Glucose, Bld: 97 mg/dL (ref 70–99)
Potassium: 4.9 mEq/L (ref 3.5–5.3)

## 2011-06-17 LAB — CBC
MCH: 24.3 pg — ABNORMAL LOW (ref 26.0–34.0)
RBC: 3.01 MIL/uL — ABNORMAL LOW (ref 3.87–5.11)
WBC: 5.4 10*3/uL (ref 4.0–10.5)

## 2011-06-17 LAB — POCT URINALYSIS DIP (DEVICE)
Glucose, UA: NEGATIVE mg/dL
Nitrite: NEGATIVE
Urobilinogen, UA: 0.2 mg/dL (ref 0.0–1.0)

## 2011-06-17 NOTE — Progress Notes (Signed)
P-[id number] 

## 2011-06-17 NOTE — Patient Instructions (Signed)

## 2011-06-17 NOTE — Progress Notes (Signed)
NST reviewed and reactive. For IOL at 39 wks.  Has PICC line No CBG log U/S on 5/23 showed 5 # 7 oz. (11%), nml fluid 2+ protein and mildly elevated BP--will check labs.

## 2011-06-17 NOTE — Progress Notes (Signed)
IOL on 06/19/11 @ 0700.

## 2011-06-18 LAB — PROTEIN / CREATININE RATIO, URINE
Protein Creatinine Ratio: 0.24 — ABNORMAL HIGH (ref <0.15)
Total Protein, Urine: 50 mg/dL

## 2011-06-19 ENCOUNTER — Encounter (HOSPITAL_COMMUNITY): Payer: Self-pay

## 2011-06-19 ENCOUNTER — Encounter (HOSPITAL_COMMUNITY): Payer: Self-pay | Admitting: Anesthesiology

## 2011-06-19 ENCOUNTER — Inpatient Hospital Stay (HOSPITAL_COMMUNITY)
Admission: AD | Admit: 2011-06-19 | Discharge: 2011-06-21 | DRG: 767 | Disposition: A | Discharge status: Home / Self Care | Payer: Medicaid Other | Source: Ambulatory Visit | Attending: Obstetrics & Gynecology | Admitting: Obstetrics & Gynecology

## 2011-06-19 ENCOUNTER — Inpatient Hospital Stay (HOSPITAL_COMMUNITY): Admission: RE | Admit: 2011-06-19 | Payer: Medicaid Other | Source: Ambulatory Visit

## 2011-06-19 VITALS — BP 156/111 | HR 82 | Temp 97.5°F | Resp 20 | Ht 66.0 in | Wt 178.0 lb

## 2011-06-19 DIAGNOSIS — E119 Type 2 diabetes mellitus without complications: Secondary | ICD-10-CM | POA: Diagnosis present

## 2011-06-19 DIAGNOSIS — O1002 Pre-existing essential hypertension complicating childbirth: Principal | ICD-10-CM | POA: Diagnosis present

## 2011-06-19 DIAGNOSIS — O210 Mild hyperemesis gravidarum: Secondary | ICD-10-CM

## 2011-06-19 DIAGNOSIS — O10919 Unspecified pre-existing hypertension complicating pregnancy, unspecified trimester: Secondary | ICD-10-CM

## 2011-06-19 DIAGNOSIS — O211 Hyperemesis gravidarum with metabolic disturbance: Secondary | ICD-10-CM

## 2011-06-19 DIAGNOSIS — D649 Anemia, unspecified: Secondary | ICD-10-CM | POA: Diagnosis present

## 2011-06-19 DIAGNOSIS — O2432 Unspecified pre-existing diabetes mellitus in childbirth: Secondary | ICD-10-CM

## 2011-06-19 DIAGNOSIS — O9902 Anemia complicating childbirth: Secondary | ICD-10-CM

## 2011-06-19 DIAGNOSIS — Z302 Encounter for sterilization: Secondary | ICD-10-CM

## 2011-06-19 LAB — COMPREHENSIVE METABOLIC PANEL
AST: 14 U/L (ref 0–37)
Albumin: 2.2 g/dL — ABNORMAL LOW (ref 3.5–5.2)
Alkaline Phosphatase: 136 U/L — ABNORMAL HIGH (ref 39–117)
BUN: 12 mg/dL (ref 6–23)
CO2: 19 mEq/L (ref 19–32)
Chloride: 102 mEq/L (ref 96–112)
GFR calc non Af Amer: 62 mL/min — ABNORMAL LOW (ref >90)
Potassium: 3.9 mEq/L (ref 3.5–5.1)
Total Bilirubin: 0.2 mg/dL — ABNORMAL LOW (ref 0.3–1.2)

## 2011-06-19 LAB — CBC
Hemoglobin: 6.7 g/dL — CL (ref 12.0–15.0)
MCH: 24.9 pg — ABNORMAL LOW (ref 26.0–34.0)
MCHC: 32.9 g/dL (ref 30.0–36.0)
Platelets: 208 10*3/uL (ref 150–400)
RDW: 14 % (ref 11.5–15.5)

## 2011-06-19 LAB — GLUCOSE, CAPILLARY
Glucose-Capillary: 73 mg/dL (ref 70–99)
Glucose-Capillary: 84 mg/dL (ref 70–99)
Glucose-Capillary: 88 mg/dL (ref 70–99)
Glucose-Capillary: 74 mg/dL (ref 70–99)
Glucose-Capillary: 84 mg/dL (ref 70–99)

## 2011-06-19 LAB — RPR: RPR Ser Ql: NONREACTIVE

## 2011-06-19 LAB — PROTEIN / CREATININE RATIO, URINE: Creatinine, Urine: 142.5 mg/dL

## 2011-06-19 MED ORDER — SODIUM BICARBONATE 8.4 % IV SOLN
INTRAVENOUS | Status: DC | PRN
  Administered 2011-06-19: 4 mL via EPIDURAL

## 2011-06-19 MED ORDER — OXYTOCIN 20 UNITS IN LACTATED RINGERS INFUSION - SIMPLE
1.0000 m[IU]/min | INTRAVENOUS | Status: DC
  Administered 2011-06-19: 1 m[IU]/min via INTRAVENOUS
  Filled 2011-06-19: qty 1000

## 2011-06-19 MED ORDER — SODIUM CHLORIDE 0.9 % IJ SOLN
10.0000 mL | Freq: Two times a day (BID) | INTRAMUSCULAR | Status: DC
  Filled 2011-06-19 (×3): qty 40

## 2011-06-19 MED ORDER — MISOPROSTOL 200 MCG PO TABS
ORAL_TABLET | ORAL | Status: AC
  Filled 2011-06-19: qty 4

## 2011-06-19 MED ORDER — PHENYLEPHRINE 40 MCG/ML (10ML) SYRINGE FOR IV PUSH (FOR BLOOD PRESSURE SUPPORT)
80.0000 ug | PREFILLED_SYRINGE | INTRAVENOUS | Status: DC | PRN
  Filled 2011-06-19: qty 5

## 2011-06-19 MED ORDER — OXYTOCIN 20 UNITS IN LACTATED RINGERS INFUSION - SIMPLE
125.0000 mL/h | Freq: Once | INTRAVENOUS | Status: AC
  Administered 2011-06-19: 999 mL/h via INTRAVENOUS

## 2011-06-19 MED ORDER — FLEET ENEMA 7-19 GM/118ML RE ENEM: 1.0000 | ENEMA | RECTAL | Status: DC | PRN

## 2011-06-19 MED ORDER — LIDOCAINE HCL (PF) 1 % IJ SOLN
30.0000 mL | INTRAMUSCULAR | Status: DC | PRN
  Filled 2011-06-19: qty 30

## 2011-06-19 MED ORDER — SODIUM CHLORIDE 0.9 % IJ SOLN
10.0000 mL | INTRAMUSCULAR | Status: DC | PRN
  Filled 2011-06-19: qty 40

## 2011-06-19 MED ORDER — OXYCODONE-ACETAMINOPHEN 5-325 MG PO TABS: 1.0000 | ORAL_TABLET | ORAL | Status: DC | PRN

## 2011-06-19 MED ORDER — CITRIC ACID-SODIUM CITRATE 334-500 MG/5ML PO SOLN: 30.0000 mL | ORAL | Status: DC | PRN

## 2011-06-19 MED ORDER — LACTATED RINGERS IV SOLN: 500.0000 mL | INTRAVENOUS | Status: DC | PRN

## 2011-06-19 MED ORDER — EPHEDRINE 5 MG/ML INJ: 10.0000 mg | INTRAVENOUS | Status: DC | PRN

## 2011-06-19 MED ORDER — ZOLPIDEM TARTRATE 10 MG PO TABS: 10.0000 mg | ORAL_TABLET | Freq: Every evening | ORAL | Status: DC | PRN

## 2011-06-19 MED ORDER — IBUPROFEN 600 MG PO TABS: 600.0000 mg | ORAL_TABLET | Freq: Four times a day (QID) | ORAL | Status: DC | PRN

## 2011-06-19 MED ORDER — FENTANYL 2.5 MCG/ML BUPIVACAINE 1/10 % EPIDURAL INFUSION (WH - ANES)
14.0000 mL/h | INTRAMUSCULAR | Status: DC
  Administered 2011-06-19: 14 mL/h via EPIDURAL
  Filled 2011-06-19 (×2): qty 60

## 2011-06-19 MED ORDER — EPHEDRINE 5 MG/ML INJ
10.0000 mg | INTRAVENOUS | Status: DC | PRN
  Filled 2011-06-19: qty 4

## 2011-06-19 MED ORDER — DIPHENHYDRAMINE HCL 50 MG/ML IJ SOLN
12.5000 mg | INTRAMUSCULAR | Status: AC | PRN
  Administered 2011-06-19 (×3): 12.5 mg via INTRAVENOUS
  Filled 2011-06-19 (×3): qty 1

## 2011-06-19 MED ORDER — OXYTOCIN BOLUS FROM INFUSION
500.0000 mL | Freq: Once | INTRAVENOUS | Status: DC
  Filled 2011-06-19: qty 500

## 2011-06-19 MED ORDER — LACTATED RINGERS IV SOLN
500.0000 mL | Freq: Once | INTRAVENOUS | Status: AC
  Administered 2011-06-19: 1000 mL via INTRAVENOUS

## 2011-06-19 MED ORDER — LACTATED RINGERS IV SOLN
INTRAVENOUS | Status: DC
  Administered 2011-06-19 (×3): via INTRAVENOUS

## 2011-06-19 MED ORDER — MISOPROSTOL 25 MCG QUARTER TABLET
25.0000 ug | ORAL_TABLET | ORAL | Status: DC | PRN
  Filled 2011-06-19: qty 0.25

## 2011-06-19 MED ORDER — PHENYLEPHRINE 40 MCG/ML (10ML) SYRINGE FOR IV PUSH (FOR BLOOD PRESSURE SUPPORT): 80.0000 ug | PREFILLED_SYRINGE | INTRAVENOUS | Status: DC | PRN

## 2011-06-19 MED ORDER — ONDANSETRON HCL 4 MG/2ML IJ SOLN: 4.0000 mg | Freq: Four times a day (QID) | INTRAMUSCULAR | Status: DC | PRN

## 2011-06-19 MED ORDER — SODIUM CHLORIDE 0.9 % IJ SOLN: 10.0000 mL | INTRAMUSCULAR | Status: DC | PRN

## 2011-06-19 MED ORDER — FENTANYL 2.5 MCG/ML BUPIVACAINE 1/10 % EPIDURAL INFUSION (WH - ANES)
INTRAMUSCULAR | Status: DC | PRN
  Administered 2011-06-19: 14 mL/h via EPIDURAL

## 2011-06-19 MED ORDER — TERBUTALINE SULFATE 1 MG/ML IJ SOLN: 0.2500 mg | Freq: Once | INTRAMUSCULAR | Status: AC | PRN

## 2011-06-19 MED ORDER — ACETAMINOPHEN 325 MG PO TABS: 650.0000 mg | ORAL_TABLET | ORAL | Status: DC | PRN

## 2011-06-19 NOTE — H&P (Signed)
[name] is a 31 y.o. female presenting for Induction of Labor for Type II Diabetes; Chronic Hypertension. Maternal Medical History:  Fetal activity: Perceived fetal activity is normal.   Last perceived fetal movement was within the past hour.    Prenatal complications: Type II Diabetes; Hyperemesis Gravid    OB History    Grav Para Term Preterm Abortions TAB SAB Ect Mult Living   4 2 1 1 1  0 0 1 0 2     Past Medical History  Diagnosis Date  . Asthma   . Diabetes mellitus   . Mental disorder   . Depression   . Anxiety   . Hypertension   . Hyperemesis arising during pregnancy    Past Surgical History  Procedure Date  . No past surgeries    Family History: family history includes Diabetes in her sister and Heart disease in her mother.  There is no history of Anesthesia problems. Social History:  reports that she has never smoked. She has never used smokeless tobacco. She reports that she does not drink alcohol or use illicit drugs.  ROS  Dilation: 1.5 Effacement (%): 50 Station: -2 Exam by:: Dr.Stinson Blood pressure 115/77, pulse 77, temperature 97.8 F (36.6 C), temperature source Oral, resp. rate 18, height 5\' 6"  (1.676 m), weight 80.74 kg (178 lb), last menstrual period 09/19/2010. Maternal Exam:  Introitus: Vagina is positive for vaginal discharge (mucusy).    Physical Exam  Constitutional: She is oriented to person, place, and time. She appears well-developed and well-nourished. No distress.  HENT:  Head: Normocephalic.  Neck: Normal range of motion. Neck supple.  Cardiovascular: Normal rate, regular rhythm and normal heart sounds.   Respiratory: Effort normal and breath sounds normal.  GI: Soft. There is no tenderness.       EFW 5.5-6lbs  Genitourinary: No bleeding around the vagina. Vaginal discharge (mucusy) found.       2/50/-2  Neurological: She is alert and oriented to person, place, and time.  Skin: Skin is warm and dry.    Prenatal  labs: ABO, Rh: --/--/A POS, A POS (05/02 1120) Antibody: NEG (05/02 1120) Rubella: 5.5 (11/20 0845) RPR: NON REAC (11/20 0845)  HBsAg: NEGATIVE (11/20 0845)  HIV: NON REACTIVE (11/20 0845)  GBS: Negative (05/16 0000)   Assessment/Plan: Induction of Labor Chronic Hypertension Type II Diabetes Anemia - Asymptomatic  Plan: Admit to [facility] Epidural in early labor  Type and Cross - 2 units  [facility] 06/19/2011, 10:19 AM

## 2011-06-19 NOTE — H&P (Signed)
Chart reviewed and agree with management and plan.  

## 2011-06-19 NOTE — Anesthesia Preprocedure Evaluation (Addendum)
Anesthesia Evaluation  Patient identified by MRN, date of birth, ID band Patient awake    Reviewed: Allergy & Precautions, H&P , Patient's Chart, lab work & pertinent test results  Airway Mallampati: II TM Distance: >3 FB Neck ROM: full    Dental  (+) Teeth Intact   Pulmonary asthma (rare inhaler use; chest clear today) ,  breath sounds clear to auscultation        Cardiovascular hypertension, Rhythm:regular Rate:Normal     Neuro/Psych PSYCHIATRIC DISORDERS    GI/Hepatic   Endo/Other  Diabetes mellitus-, Type 2, Insulin Dependent  Renal/GU      Musculoskeletal   Abdominal   Peds  Hematology   Anesthesia Other Findings       Reproductive/Obstetrics (+) Pregnancy                           Anesthesia Physical Anesthesia Plan  ASA: III  Anesthesia Plan: Epidural   Post-op Pain Management:    Induction:   Airway Management Planned:   Additional Equipment:   Intra-op Plan:   Post-operative Plan:   Informed Consent: I have reviewed the patients History and Physical, chart, labs and discussed the procedure including the risks, benefits and alternatives for the proposed anesthesia with the patient or authorized representative who has indicated his/her understanding and acceptance.   Dental Advisory Given  Plan Discussed with:   Anesthesia Plan Comments: (Labs checked- platelets confirmed with RN in room. Fetal heart tracing, per RN, reported to be stable enough for sitting procedure. Discussed epidural, and patient consents to the procedure:  included risk of possible headache,backache, failed block, allergic reaction, and nerve injury. This patient was asked if she had any questions or concerns before the procedure started. )        Anesthesia Quick Evaluation

## 2011-06-19 NOTE — Progress Notes (Signed)
   Subjective: Pt reports continued comfort after epidural.  Questions about IUPC.    Objective: BP 130/87  Pulse 67  Temp(Src) 98.3 F (36.8 C) (Oral)  Resp 16  Ht 5\' 6"  (1.676 m)  Wt 80.74 kg (178 lb)  BMI 28.73 kg/m2  SpO2 100%  LMP 09/19/2010      FHT:  FHR: 130's bpm, variability: moderate,  accelerations:  Present,  decelerations:  Absent UC:   Poor tracing SVE:   Dilation: 5.5 Effacement (%): 90 Station: -2 Exam by:: [id number]  Labs: Lab Results  Component Value Date   WBC 4.4 06/19/2011   HGB 6.7* 06/19/2011   HCT 20.7* 06/19/2011   MCV 75.8* 06/19/2011   PLT 208 06/19/2011    Assessment / Plan: Labor - no cervical change  Labor: Labor - no cervical change Preeclampsia:  labs stable Fetal Wellbeing:  Category I Pain Control:  Epidural I/D:  n/a Anticipated MOD:  NSVD AROM>IUPC placed (blood tinged amniotic fluid)  [facility] 06/19/2011, 8:26 PM

## 2011-06-19 NOTE — Progress Notes (Signed)
   Subjective: Pt reports feeling some cramping.  Objective: BP 152/77  Pulse 63  Temp(Src) 98.3 F (36.8 C) (Oral)  Resp 18  Ht 5\' 6"  (1.676 m)  Wt 80.74 kg (178 lb)  BMI 28.73 kg/m2  LMP 09/19/2010      FHT:  FHR: 130's bpm, variability: moderate,  accelerations:  Present,  decelerations:  Absent UC:   irritability SVE:   Dilation: 3 Effacement (%): 70 Station: -2 Exam by:: J.Thornton, RN  Labs: Lab Results  Component Value Date   WBC 4.4 06/19/2011   HGB 6.7* 06/19/2011   HCT 20.7* 06/19/2011   MCV 75.8* 06/19/2011   PLT 208 06/19/2011    Assessment / Plan: Augmentation of labor, progressing well  Labor: Progressing normally Preeclampsia:  Labs pending Fetal Wellbeing:  Category I Pain Control:  Labor support without medications I/D:  n/a Anticipated MOD:  NSVD  [facility] 06/19/2011, 1:33 PM

## 2011-06-19 NOTE — Anesthesia Procedure Notes (Signed)
Epidural Patient location during procedure: OB  Preanesthetic Checklist Completed: patient identified, site marked, surgical consent, pre-op evaluation, timeout performed, IV checked, risks and benefits discussed and monitors and equipment checked  Epidural Patient position: sitting Prep: site prepped and draped and DuraPrep Patient monitoring: continuous pulse ox and blood pressure Approach: midline Injection technique: LOR air  Needle:  Needle type: Tuohy  Needle gauge: 17 G Needle length: 9 cm Needle insertion depth: 6 cm Catheter type: closed end flexible Catheter size: 19 Gauge Catheter at skin depth: 12 cm Test dose: negative  Assessment Events: blood not aspirated, injection not painful, no injection resistance, negative IV test and no paresthesia  Additional Notes Dosing of Epidural:  1st dose, through needle ............................................Marland Kitchen epi 1:200K + Xylocaine 40 mg  2nd dose, through catheter, after waiting 3 minutes...Marland KitchenMarland Kitchenepi 1:200K + Xylocaine 40 mg  3rd dose, through catheter after waiting 3 minutes .............................Marcaine   4mg    ( mg Marcaine are expressed as equivilent  cc's medication removed from the 0.1%Bupiv / fentanyl syringe from L&D pump)  ( 2% Xylo charted as a single dose in Epic Meds for ease of charting; actual dosing was fractionated as above, for saftey's sake)  As each dose occurred, patient was free of IV sx; and patient exhibited no evidence of SA injection.  Patient is more comfortable after epidural dosed. Please see RN's note for documentation of vital signs,and FHR which are stable.

## 2011-06-19 NOTE — Progress Notes (Signed)
  Subjective: Pt comfortable with epidural.  No questions or concerns.    Objective: BP 125/105  Pulse 82  Temp(Src) 98.1 F (36.7 C) (Oral)  Resp 18  Ht 5\' 6"  (1.676 m)  Wt 80.74 kg (178 lb)  BMI 28.73 kg/m2  SpO2 100%  LMP 09/19/2010      FHT:  FHR: 130's bpm, variability: moderate,  accelerations:  Present,  decelerations:  Absent UC:   irregular, every 4-7 minutes SVE:   Dilation: 5.5 Effacement (%): 90 Station: -2 Exam by:: J.Thornton, RN  Labs: Lab Results  Component Value Date   WBC 4.4 06/19/2011   HGB 6.7* 06/19/2011   HCT 20.7* 06/19/2011   MCV 75.8* 06/19/2011   PLT 208 06/19/2011    Assessment / Plan: Augmentation of labor, progressing well  Labor: Progressing on Pitocin, will continue to increase then AROM Preeclampsia:  labs stable Fetal Wellbeing:  Category I Pain Control:  Epidural I/D:  n/a Anticipated MOD:  NSVD  [facility] 06/19/2011, 5:38 PM

## 2011-06-20 ENCOUNTER — Encounter (HOSPITAL_COMMUNITY): Payer: Self-pay

## 2011-06-20 ENCOUNTER — Encounter (HOSPITAL_COMMUNITY): Payer: Self-pay | Admitting: Anesthesiology

## 2011-06-20 ENCOUNTER — Encounter (HOSPITAL_COMMUNITY)
Admission: AD | Disposition: A | Discharge status: Home / Self Care | Payer: Self-pay | Source: Ambulatory Visit | Attending: Obstetrics & Gynecology

## 2011-06-20 DIAGNOSIS — Z302 Encounter for sterilization: Secondary | ICD-10-CM

## 2011-06-20 HISTORY — PX: TUBAL LIGATION: SHX77

## 2011-06-20 LAB — GLUCOSE, CAPILLARY
Glucose-Capillary: 83 mg/dL (ref 70–99)
Glucose-Capillary: 95 mg/dL (ref 70–99)
Glucose-Capillary: 96 mg/dL (ref 70–99)
Glucose-Capillary: 98 mg/dL (ref 70–99)
Glucose-Capillary: 119 mg/dL — ABNORMAL HIGH (ref 70–99)

## 2011-06-20 LAB — CBC
HCT: 20.3 % — ABNORMAL LOW (ref 36.0–46.0)
Hemoglobin: 6.5 g/dL — CL (ref 12.0–15.0)
MCHC: 32 g/dL (ref 30.0–36.0)
RBC: 2.69 MIL/uL — ABNORMAL LOW (ref 3.87–5.11)
WBC: 8.1 10*3/uL (ref 4.0–10.5)

## 2011-06-20 LAB — SURGICAL PCR SCREEN: Staphylococcus aureus: POSITIVE — AB

## 2011-06-20 SURGERY — LIGATION, FALLOPIAN TUBE, POSTPARTUM
Anesthesia: Epidural | Site: Abdomen | Laterality: Bilateral | Wound class: Clean Contaminated

## 2011-06-20 MED ORDER — SODIUM BICARBONATE 8.4 % IV SOLN
INTRAVENOUS | Status: DC | PRN
  Administered 2011-06-20: 5 mL via EPIDURAL

## 2011-06-20 MED ORDER — ZOLPIDEM TARTRATE 5 MG PO TABS: 5.0000 mg | ORAL_TABLET | Freq: Every evening | ORAL | Status: DC | PRN

## 2011-06-20 MED ORDER — SIMETHICONE 80 MG PO CHEW: 80.0000 mg | CHEWABLE_TABLET | ORAL | Status: DC | PRN

## 2011-06-20 MED ORDER — CHLORHEXIDINE GLUCONATE CLOTH 2 % EX PADS
6.0000 | MEDICATED_PAD | Freq: Every day | CUTANEOUS | Status: DC
  Administered 2011-06-21: 6 via TOPICAL

## 2011-06-20 MED ORDER — FENTANYL CITRATE 0.05 MG/ML IJ SOLN
INTRAMUSCULAR | Status: DC | PRN
  Administered 2011-06-20 (×2): 50 ug via INTRAVENOUS

## 2011-06-20 MED ORDER — LIDOCAINE-EPINEPHRINE (PF) 2 %-1:200000 IJ SOLN
INTRAMUSCULAR | Status: AC
  Filled 2011-06-20: qty 20

## 2011-06-20 MED ORDER — KETOROLAC TROMETHAMINE 30 MG/ML IJ SOLN
INTRAMUSCULAR | Status: DC | PRN
  Administered 2011-06-20: 30 mg via INTRAVENOUS

## 2011-06-20 MED ORDER — FENTANYL CITRATE 0.05 MG/ML IJ SOLN: 25.0000 ug | INTRAMUSCULAR | Status: DC | PRN

## 2011-06-20 MED ORDER — OXYCODONE-ACETAMINOPHEN 5-325 MG PO TABS
1.0000 | ORAL_TABLET | ORAL | Status: DC | PRN
  Administered 2011-06-20 – 2011-06-21 (×4): 1 via ORAL
  Filled 2011-06-20 (×4): qty 1

## 2011-06-20 MED ORDER — PROPOFOL 10 MG/ML IV EMUL
INTRAVENOUS | Status: DC | PRN
  Administered 2011-06-20: 20 mg via INTRAVENOUS

## 2011-06-20 MED ORDER — BUPIVACAINE HCL (PF) 0.25 % IJ SOLN
INTRAMUSCULAR | Status: DC | PRN
  Administered 2011-06-20: 8 mL

## 2011-06-20 MED ORDER — ONDANSETRON HCL 4 MG/2ML IJ SOLN
INTRAMUSCULAR | Status: AC
  Filled 2011-06-20: qty 2

## 2011-06-20 MED ORDER — LIDOCAINE HCL (CARDIAC) 20 MG/ML IV SOLN
INTRAVENOUS | Status: AC
  Filled 2011-06-20: qty 5

## 2011-06-20 MED ORDER — KETOROLAC TROMETHAMINE 30 MG/ML IJ SOLN
INTRAMUSCULAR | Status: AC
  Filled 2011-06-20: qty 1

## 2011-06-20 MED ORDER — WITCH HAZEL-GLYCERIN EX PADS: 1.0000 "application " | MEDICATED_PAD | CUTANEOUS | Status: DC | PRN

## 2011-06-20 MED ORDER — LACTATED RINGERS IV SOLN: INTRAVENOUS | Status: DC

## 2011-06-20 MED ORDER — BUPIVACAINE HCL (PF) 0.25 % IJ SOLN
INTRAMUSCULAR | Status: AC
  Filled 2011-06-20: qty 30

## 2011-06-20 MED ORDER — LIDOCAINE HCL (CARDIAC) 20 MG/ML IV SOLN
INTRAVENOUS | Status: DC | PRN
  Administered 2011-06-20: 30 mg via INTRAVENOUS

## 2011-06-20 MED ORDER — ONDANSETRON HCL 4 MG/2ML IJ SOLN
INTRAMUSCULAR | Status: DC | PRN
  Administered 2011-06-20: 4 mg via INTRAVENOUS

## 2011-06-20 MED ORDER — FAMOTIDINE 20 MG PO TABS: 40.0000 mg | ORAL_TABLET | Freq: Once | ORAL | Status: DC

## 2011-06-20 MED ORDER — MUPIROCIN 2 % EX OINT
1.0000 "application " | TOPICAL_OINTMENT | Freq: Two times a day (BID) | CUTANEOUS | Status: DC
  Administered 2011-06-20 – 2011-06-21 (×2): 1 via NASAL
  Filled 2011-06-20: qty 22

## 2011-06-20 MED ORDER — METOCLOPRAMIDE HCL 10 MG PO TABS: 10.0000 mg | ORAL_TABLET | Freq: Once | ORAL | Status: DC

## 2011-06-20 MED ORDER — LANOLIN HYDROUS EX OINT: TOPICAL_OINTMENT | CUTANEOUS | Status: DC | PRN

## 2011-06-20 MED ORDER — ONDANSETRON HCL 4 MG PO TABS: 4.0000 mg | ORAL_TABLET | ORAL | Status: DC | PRN

## 2011-06-20 MED ORDER — TETANUS-DIPHTH-ACELL PERTUSSIS 5-2.5-18.5 LF-MCG/0.5 IM SUSP
0.5000 mL | Freq: Once | INTRAMUSCULAR | Status: AC
  Administered 2011-06-20: 0.5 mL via INTRAMUSCULAR
  Filled 2011-06-20: qty 0.5

## 2011-06-20 MED ORDER — MIDAZOLAM HCL 5 MG/5ML IJ SOLN
INTRAMUSCULAR | Status: DC | PRN
  Administered 2011-06-20 (×2): 1 mg via INTRAVENOUS

## 2011-06-20 MED ORDER — DIPHENHYDRAMINE HCL 25 MG PO CAPS: 25.0000 mg | ORAL_CAPSULE | Freq: Four times a day (QID) | ORAL | Status: DC | PRN

## 2011-06-20 MED ORDER — FENTANYL CITRATE 0.05 MG/ML IJ SOLN
INTRAMUSCULAR | Status: AC
  Filled 2011-06-20: qty 2

## 2011-06-20 MED ORDER — BENZOCAINE-MENTHOL 20-0.5 % EX AERO: 1.0000 "application " | INHALATION_SPRAY | CUTANEOUS | Status: DC | PRN

## 2011-06-20 MED ORDER — DIBUCAINE 1 % RE OINT: 1.0000 "application " | TOPICAL_OINTMENT | RECTAL | Status: DC | PRN

## 2011-06-20 MED ORDER — PROPOFOL 10 MG/ML IV EMUL
INTRAVENOUS | Status: AC
  Filled 2011-06-20: qty 20

## 2011-06-20 MED ORDER — ONDANSETRON HCL 4 MG/2ML IJ SOLN: 4.0000 mg | INTRAMUSCULAR | Status: DC | PRN

## 2011-06-20 MED ORDER — FAMOTIDINE 20 MG PO TABS
40.0000 mg | ORAL_TABLET | Freq: Once | ORAL | Status: AC
  Administered 2011-06-20: 40 mg via ORAL
  Filled 2011-06-20: qty 2

## 2011-06-20 MED ORDER — IBUPROFEN 600 MG PO TABS
600.0000 mg | ORAL_TABLET | Freq: Four times a day (QID) | ORAL | Status: DC
  Administered 2011-06-20 – 2011-06-21 (×7): 600 mg via ORAL
  Filled 2011-06-20 (×7): qty 1

## 2011-06-20 MED ORDER — LACTATED RINGERS IV SOLN
INTRAVENOUS | Status: DC
  Administered 2011-06-20 (×2): via INTRAVENOUS

## 2011-06-20 MED ORDER — SODIUM BICARBONATE 8.4 % IV SOLN
INTRAVENOUS | Status: AC
  Filled 2011-06-20: qty 50

## 2011-06-20 MED ORDER — METOCLOPRAMIDE HCL 10 MG PO TABS
10.0000 mg | ORAL_TABLET | Freq: Once | ORAL | Status: AC
  Administered 2011-06-20: 10 mg via ORAL
  Filled 2011-06-20: qty 1

## 2011-06-20 MED ORDER — PRENATAL MULTIVITAMIN CH
1.0000 | ORAL_TABLET | Freq: Every day | ORAL | Status: DC
  Administered 2011-06-21: 1 via ORAL
  Filled 2011-06-20: qty 1

## 2011-06-20 MED ORDER — SENNOSIDES-DOCUSATE SODIUM 8.6-50 MG PO TABS: 2.0000 | ORAL_TABLET | Freq: Every day | ORAL | Status: DC

## 2011-06-20 MED ORDER — MISOPROSTOL 200 MCG PO TABS
800.0000 ug | ORAL_TABLET | Freq: Once | ORAL | Status: AC
  Administered 2011-06-19: 800 ug via RECTAL
  Filled 2011-06-20: qty 4

## 2011-06-20 MED ORDER — MIDAZOLAM HCL 2 MG/2ML IJ SOLN
INTRAMUSCULAR | Status: AC
  Filled 2011-06-20: qty 2

## 2011-06-20 MED ORDER — SODIUM CHLORIDE 0.9 % IR SOLN
Status: DC | PRN
  Administered 2011-06-20: 1000 mL

## 2011-06-20 SURGICAL SUPPLY — 20 items
CHLORAPREP W/TINT 26ML (MISCELLANEOUS) ×2 IMPLANT
CONTAINER PREFILL 10% NBF 15ML (MISCELLANEOUS) ×4 IMPLANT
DRSG COVADERM PLUS 2X2 (GAUZE/BANDAGES/DRESSINGS) ×1 IMPLANT
GLOVE BIOGEL PI IND STRL 6.5 (GLOVE) ×2 IMPLANT
GLOVE BIOGEL PI INDICATOR 6.5 (GLOVE) ×4
GLOVE SURG SS PI 6.0 STRL IVOR (GLOVE) ×2 IMPLANT
GOWN PREVENTION PLUS LG XLONG (DISPOSABLE) ×5 IMPLANT
NDL HYPO 25X1 1.5 SAFETY (NEEDLE) IMPLANT
NEEDLE HYPO 25X1 1.5 SAFETY (NEEDLE) ×2 IMPLANT
NS IRRIG 1000ML POUR BTL (IV SOLUTION) ×2 IMPLANT
PACK ABDOMINAL MINOR (CUSTOM PROCEDURE TRAY) ×2 IMPLANT
SPONGE LAP 4X18 X RAY DECT (DISPOSABLE) ×1 IMPLANT
SUT PLAIN 0 NONE (SUTURE) ×2 IMPLANT
SUT VIC AB 0 CT1 27 (SUTURE) ×2
SUT VIC AB 0 CT1 27XBRD ANBCTR (SUTURE) ×1 IMPLANT
SUT VIC AB 3-0 PS2 18 (SUTURE) ×2 IMPLANT
SYR CONTROL 10ML LL (SYRINGE) ×1 IMPLANT
TOWEL OR 17X24 6PK STRL BLUE (TOWEL DISPOSABLE) ×4 IMPLANT
TRAY FOLEY CATH 14FR (SET/KITS/TRAYS/PACK) ×2 IMPLANT
WATER STERILE IRR 1000ML POUR (IV SOLUTION) ×1 IMPLANT

## 2011-06-20 NOTE — Anesthesia Postprocedure Evaluation (Signed)
  Anesthesia Post-op Note  Patient: [name]  Procedure(s) Performed: Procedure(s) (LRB): POST PARTUM TUBAL LIGATION (Bilateral)  Patient Location: PACU and [facility]  Anesthesia Type: Epidural  Level of Consciousness: awake, alert  and oriented  Airway and Oxygen Therapy: Patient Spontanous Breathing    Post-op Assessment: Patient's Cardiovascular Status Stable and Respiratory Function Stable  Post-op Vital Signs: stable  Complications: No apparent anesthesia complications

## 2011-06-20 NOTE — Op Note (Signed)
[name] 06/19/2011 - 06/20/2011  PREOPERATIVE DIAGNOSIS:  Undesired fertility  POSTOPERATIVE DIAGNOSIS:  Undesired fertility  PROCEDURE:  Postpartum Bilateral Tubal Sterilization using Pomeroy method   ANESTHESIA:  Epidural  SURGEON: Dr Mora Bellman  ASSISTANT: Dr Loma Boston  COMPLICATIONS:  None immediate.  ESTIMATED BLOOD LOSS:  Less than 20cc.  FLUIDS: 500 cc LR.  URINE OUTPUT:  100 cc of clear urine.  INDICATIONS: 31 y.o. yo [phone]  with undesired fertility,status post vaginal delivery, desires permanent sterilization. Risks and benefits of procedure discussed with patient including permanence of method, bleeding, infection, injury to surrounding organs and need for additional procedures. Risk failure of 0.5-1% with increased risk of ectopic gestation if pregnancy occurs was also discussed with patient.   FINDINGS:  Normal uterus, tubes, and ovaries.  TECHNIQUE: After informed consent was obtained, the patient was taken to the operating room where anesthesia was induced and found to be adequate. A small transverse, infraumbilical skin incision was made with the scalpel. This incision was carried down to the underlying layer of fascia. The fascia was grasped with Kocher clamps tented up and entered sharply with Mayo scissors. Underlying peritoneum was then identified tented up and entered sharply with Metzenbaum scissors. The fascia was tagged with 0 Vicryl. The patient's left fallopian tube was then identified, brought to the incision, and grasped with a Babcock clamp. The tube was then followed out to the fimbria. The Babcock clamp was then used to grasp the tube approximately 4 cm from the cornual region. A 3 cm segment of the tube was then ligated with free tie of plain gut suture, transected and excised. Good hemostasis was noted and the tube was returned to the abdomen. The right fallopian tube was then identified to its fimbriated end, ligated, and a 3 cm segment excised  in a similar fashion. Excellent hemostasis was noted, and the tube returned to the abdomen. The fascia was re-approximated with 0 Vicryl. The skin was closed in a subcuticular fashion with 3-0 Vicryl. Quarter percent Marcaine solution was then injected at the incision site. The patient tolerated the procedure well. Sponge, lap, and needle count were correct x2. The patient was taken to recovery room in stable condition.  [name], DO 06/20/2011 9:55 AM

## 2011-06-20 NOTE — Anesthesia Postprocedure Evaluation (Signed)
  Anesthesia Post-op Note  Patient: [name]   Patient is awake, responsive, moving her legs, and has signs of resolution of her numbness. Pain and nausea are reasonably well controlled. Vital signs are stable and clinically acceptable. Oxygen saturation is clinically acceptable. There are no apparent anesthetic complications at this time. Patient is ready for discharge.
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
## 2011-06-20 NOTE — Anesthesia Postprocedure Evaluation (Signed)
  Anesthesia Post-op Note  Patient: [name]  Procedure(s) Performed: Procedure(s) (LRB): POST PARTUM TUBAL LIGATION (Bilateral)  Patient is awake, responsive, moving her legs, and has signs of resolution of her numbness. Pain and nausea are reasonably well controlled. Vital signs are stable and clinically acceptable. Oxygen saturation is clinically acceptable. There are no apparent anesthetic complications at this time. Patient is ready for discharge.

## 2011-06-20 NOTE — Anesthesia Preprocedure Evaluation (Signed)
Anesthesia Evaluation  Patient identified by MRN, date of birth, ID band Patient awake    Reviewed: Allergy & Precautions, H&P , Patient's Chart, lab work & pertinent test results  Airway Mallampati: II TM Distance: >3 FB Neck ROM: full    Dental  (+) Teeth Intact   Pulmonary asthma (rare inhaler use; chest clear today) ,  breath sounds clear to auscultation        Cardiovascular hypertension, Rhythm:regular Rate:Normal     Neuro/Psych PSYCHIATRIC DISORDERS    GI/Hepatic   Endo/Other  Diabetes mellitus-, Type 2, Insulin Dependent  Renal/GU      Musculoskeletal   Abdominal   Peds  Hematology   Anesthesia Other Findings       Reproductive/Obstetrics (+) Pregnancy                           Anesthesia Physical  Anesthesia Plan  ASA: III  Anesthesia Plan: Epidural   Post-op Pain Management:    Induction:   Airway Management Planned:   Additional Equipment:   Intra-op Plan:   Post-operative Plan:   Informed Consent: I have reviewed the patients History and Physical, chart, labs and discussed the procedure including the risks, benefits and alternatives for the proposed anesthesia with the patient or authorized representative who has indicated his/her understanding and acceptance.   Dental Advisory Given  Plan Discussed with:   Anesthesia Plan Comments: (Labs checked- platelets okay, low Hb noted before procedure. Discussed epidural placed yesterday, and patient consents to the procedure:  This patient was asked if she had any questions or concerns before the procedure started. )        Anesthesia Quick Evaluation

## 2011-06-20 NOTE — Preoperative (Signed)
Beta Blockers   Reason not to administer Beta Blockers:Not Applicable 

## 2011-06-20 NOTE — Progress Notes (Signed)
31 y.o. yo [phone]  with undesired fertility,status post vaginal delivery, desires permanent sterilization. Risks and benefits of procedure discussed with patient including permanence of method, bleeding, infection, injury to surrounding organs and need for additional procedures. Risk failure of 0.5-1% with increased risk of ectopic gestation if pregnancy occurs was also discussed with patient.

## 2011-06-20 NOTE — Addendum Note (Signed)
Addendum  created 06/20/11 1758 by Cyril Loosen, CRNA   Modules edited:Notes Section

## 2011-06-20 NOTE — Transfer of Care (Signed)
Immediate Anesthesia Transfer of Care Note  Patient: [name]  Procedure(s) Performed: Procedure(s) (LRB): POST PARTUM TUBAL LIGATION (Bilateral)  Patient Location: PACU  Anesthesia Type: Epidural  Level of Consciousness: awake, alert  and patient cooperative  Airway & Oxygen Therapy: Patient Spontanous Breathing  Post-op Assessment: Report given to PACU RN  Post vital signs: Reviewed  Complications: No apparent anesthesia complications

## 2011-06-20 NOTE — Progress Notes (Signed)
Called Dr. Nehemiah Settle to check if IV fluids could be discontinued.  Pt also requesting that PICC line be removed.  Dr. Nehemiah Settle ordered that fluids may be discontinued; however, PICC line will be removed some time prior to discharge.  Also let Dr. Nehemiah Settle know about elevated blood pressures post BTL and that they are decreasing.  No other orders written.  Will monitor closely.  Maxwell Caul, Leretha Dykes [place]

## 2011-06-21 MED ORDER — IBUPROFEN 600 MG PO TABS: 600.0000 mg | ORAL_TABLET | Freq: Four times a day (QID) | ORAL | Status: AC

## 2011-06-21 MED ORDER — OXYCODONE-ACETAMINOPHEN 5-325 MG PO TABS: 1.0000 | ORAL_TABLET | ORAL | Status: DC | PRN

## 2011-06-21 MED ORDER — OXYCODONE-ACETAMINOPHEN 5-325 MG PO TABS: 1.0000 | ORAL_TABLET | ORAL | Status: AC | PRN

## 2011-06-21 MED ORDER — HYDROCHLOROTHIAZIDE 50 MG PO TABS: 50.0000 mg | ORAL_TABLET | Freq: Every day | ORAL | Status: DC

## 2011-06-21 MED ORDER — HYDROCHLOROTHIAZIDE 50 MG PO TABS
50.0000 mg | ORAL_TABLET | Freq: Every day | ORAL | Status: DC
  Administered 2011-06-21: 50 mg via ORAL
  Filled 2011-06-21 (×3): qty 1

## 2011-06-21 MED ORDER — HYDROCHLOROTHIAZIDE 25 MG PO TABS
25.0000 mg | ORAL_TABLET | Freq: Every day | ORAL | Status: DC
  Filled 2011-06-21: qty 1
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
## 2011-06-21 NOTE — Discharge Instructions (Signed)
Vaginal Delivery Care After  Change your pad on each trip to the bathroom.   Wipe gently with toilet paper during your hospital stay. Always wipe from front to back. A spray bottle with warm tap water could also be used or a towelette if available.   Place your soiled pad and toilet paper in a bathroom wastebasket with a plastic bag liner.   During your hospital stay, save any clots. If you pass a clot while on the toilet, do not flush it. Also, if your vaginal flow seems excessive to you, notify nursing personnel.   The first time you get out of bed after delivery, wait for assistance from a nurse. Do not get up alone at any time if you feel weak or dizzy.   Bend and extend your ankles forcefully so that you feel the calves of your legs get hard. Do this 6 times every hour when you are in bed and awake.   Do not sit with one foot under you, dangle your legs over the edge of the bed, or maintain a position that hinders the circulation in your legs.   Many women experience after pains for 2 to 3 days after delivery. These after pains are mild uterine contractions. Ask the nurse for a pain medication if you need something for this. Sometimes breastfeeding stimulates after pains; if you find this to be true, ask for the medication  -  hour before the next feeding.   For you and your infant's protection, do not go beyond the door(s) of the obstetric unit. Do not carry your baby in your arms in the hallway. When taking your baby to and from your room, put your baby in the bassinet and push the bassinet.   Mothers may have their babies in their room as much as they desire.  Document Released: 01/02/2000 Document Revised: 12/24/2010 Document Reviewed: 12/02/2006 ExitCare Patient Information 2012 [employer]. 

## 2011-06-21 NOTE — Discharge Summary (Signed)
Obstetric Discharge Summary Reason for Admission: induction of labor Prenatal Procedures: NST Intrapartum Procedures: spontaneous vaginal delivery Postpartum Procedures: P.P. tubal ligation Complications-Operative and Postpartum: none Hemoglobin  Date Value Range Status  06/20/2011 6.5* 12.0-15.0 (g/dL) Final     DELTA CHECK NOTED     REPEATED TO VERIFY     CRITICAL RESULT CALLED TO, READ BACK BY AND VERIFIED WITH:     MEARS,L AT 0630 ON 06/20/11 BY MOSLEY,J     HCT  Date Value Range Status  06/20/2011 20.3* 36.0-46.0 (%) Final    Physical Exam:  General: alert, cooperative and no distress Lochia: appropriate Uterine Fundus: firm DVT Evaluation: No evidence of DVT seen on physical exam. Negative Homan's sign. No cords or calf tenderness. No significant calf/ankle edema.  Discharge Diagnoses: Term Pregnancy-delivered  Discharge Information: Date: 06/21/2011 Activity: pelvic rest Diet: routine Medications: PNV, Ibuprofen, Percocet and HCTZ Condition: stable Instructions: refer to practice specific booklet Discharge to: home Follow-up Information    Follow up with [facility] in 4 weeks.   Contact information:   [facility] [phone]          Newborn Data: Live born female  Birth Weight: 5 lb 11.5 oz (2594 g) APGAR: 9, 9  Home with mother.  [name] 06/21/2011, 8:52 AM

## 2011-06-22 ENCOUNTER — Encounter (HOSPITAL_COMMUNITY): Payer: Self-pay | Admitting: Obstetrics and Gynecology

## 2011-06-23 LAB — TYPE AND SCREEN: ABO/RH(D): A POS

## 2011-06-24 NOTE — Progress Notes (Signed)
Post discharge chart review completed.  

## 2011-06-24 NOTE — Op Note (Signed)
Agree with above note.  [name] 06/24/2011 1:14 PM

## 2011-06-25 ENCOUNTER — Inpatient Hospital Stay (HOSPITAL_COMMUNITY): Payer: Medicaid Other

## 2011-07-28 ENCOUNTER — Other Ambulatory Visit (HOSPITAL_COMMUNITY)
Admission: RE | Admit: 2011-07-28 | Discharge: 2011-07-28 | Disposition: A | Discharge status: Home / Self Care | Payer: Medicaid Other | Source: Ambulatory Visit | Attending: Advanced Practice Midwife | Admitting: Advanced Practice Midwife

## 2011-07-28 ENCOUNTER — Ambulatory Visit (INDEPENDENT_AMBULATORY_CARE_PROVIDER_SITE_OTHER): Payer: Medicaid Other | Admitting: Advanced Practice Midwife

## 2011-07-28 ENCOUNTER — Encounter: Payer: Self-pay | Admitting: Advanced Practice Midwife

## 2011-07-28 VITALS — BP 131/90 | HR 78 | Temp 96.9°F | Ht 66.0 in | Wt 170.0 lb

## 2011-07-28 DIAGNOSIS — O10919 Unspecified pre-existing hypertension complicating pregnancy, unspecified trimester: Secondary | ICD-10-CM

## 2011-07-28 DIAGNOSIS — B977 Papillomavirus as the cause of diseases classified elsewhere: Secondary | ICD-10-CM

## 2011-07-28 DIAGNOSIS — Z01419 Encounter for gynecological examination (general) (routine) without abnormal findings: Secondary | ICD-10-CM

## 2011-07-28 DIAGNOSIS — IMO0001 Reserved for inherently not codable concepts without codable children: Secondary | ICD-10-CM

## 2011-07-28 DIAGNOSIS — M549 Dorsalgia, unspecified: Secondary | ICD-10-CM | POA: Insufficient documentation

## 2011-07-28 DIAGNOSIS — Z0000 Encounter for general adult medical examination without abnormal findings: Secondary | ICD-10-CM

## 2011-07-28 DIAGNOSIS — R87810 Cervical high risk human papillomavirus (HPV) DNA test positive: Secondary | ICD-10-CM | POA: Insufficient documentation

## 2011-07-28 NOTE — Patient Instructions (Addendum)
Back Pain, Adult Low back pain is very common. About 1 in 5 people have back pain.The cause of low back pain is rarely dangerous. The pain often gets better over time.About half of people with a sudden onset of back pain feel better in just 2 weeks. About 8 in 10 people feel better by 6 weeks.  CAUSES Some common causes of back pain include:  Strain of the muscles or ligaments supporting the spine.   Wear and tear (degeneration) of the spinal discs.   Arthritis.   Direct injury to the back.  DIAGNOSIS Most of the time, the direct cause of low back pain is not known.However, back pain can be treated effectively even when the exact cause of the pain is unknown.Answering your caregiver's questions about your overall health and symptoms is one of the most accurate ways to make sure the cause of your pain is not dangerous. If your caregiver needs more information, he or she may order lab work or imaging tests (X-rays or MRIs).However, even if imaging tests show changes in your back, this usually does not require surgery. HOME CARE INSTRUCTIONS For many people, back pain returns.Since low back pain is rarely dangerous, it is often a condition that people can learn to manageon their own.   Remain active. It is stressful on the back to sit or stand in one place. Do not sit, drive, or stand in one place for more than 30 minutes at a time. Take short walks on level surfaces as soon as pain allows.Try to increase the length of time you walk each day.   Do not stay in bed.Resting more than 1 or 2 days can delay your recovery.   Do not avoid exercise or work.Your body is made to move.It is not dangerous to be active, even though your back may hurt.Your back will likely heal faster if you return to being active before your pain is gone.   Pay attention to your body when you bend and lift. Many people have less discomfortwhen lifting if they bend their knees, keep the load close to their  bodies,and avoid twisting. Often, the most comfortable positions are those that put less stress on your recovering back.   Find a comfortable position to sleep. Use a firm mattress and lie on your side with your knees slightly bent. If you lie on your back, put a pillow under your knees.   Only take over-the-counter or prescription medicines as directed by your caregiver. Over-the-counter medicines to reduce pain and inflammation are often the most helpful.Your caregiver may prescribe muscle relaxant drugs.These medicines help dull your pain so you can more quickly return to your normal activities and healthy exercise.   Put ice on the injured area.   Put ice in a plastic bag.   Place a towel between your skin and the bag.   Leave the ice on for 15 to 20 minutes, 3 to 4 times a day for the first 2 to 3 days. After that, ice and heat may be alternated to reduce pain and spasms.   Ask your caregiver about trying back exercises and gentle massage. This may be of some benefit.   Avoid feeling anxious or stressed.Stress increases muscle tension and can worsen back pain.It is important to recognize when you are anxious or stressed and learn ways to manage it.Exercise is a great option.  SEEK MEDICAL CARE IF:  You have pain that is not relieved with rest or medicine.   You have   pain that does not improve in 1 week.   You have new symptoms.   You are generally not feeling well.  SEEK IMMEDIATE MEDICAL CARE IF:   You have pain that radiates from your back into your legs.   You develop new bowel or bladder control problems.   You have unusual weakness or numbness in your arms or legs.   You develop nausea or vomiting.   You develop abdominal pain.   You feel faint.  Document Released: 01/04/2005 Document Revised: 12/24/2010 Document Reviewed: 05/25/2010 ExitCare Patient Information 2012 [employer]. 

## 2011-07-28 NOTE — Progress Notes (Signed)
Patient ID: [name], female   DOB: 1981-01-17, 31 y.o.   MRN: [id number] Subjective:     [name] is a 31 y.o. female who presents for a postpartum visit. She is 6 weeks postpartum following a spontaneous vaginal delivery. I have fully reviewed the prenatal and intrapartum course. s. Outcome: spontaneous vaginal delivery. Anesthesia: epidural. Postpartum course has been Uneventful.  Her Blood sugars have been within normal limits according to patient. She denies any PIH symptoms.   Baby's course has been uneventful. Baby is feeding by bottle - . Bleeding no bleeding. Bowel function is normal. Bladder function is normal. Patient is sexually active. Contraception method is tubal ligation. Postpartum depression screening: negative.  The following portions of the patient's history were reviewed and updated as appropriate: allergies, current medications, past family history, past medical history, past social history, past surgical history and problem list.  Review of Systems Pertinent items are noted in HPI.   Objective:    BP 131/90  Pulse 78  Temp 96.9 F (36.1 C) (Oral)  Ht 5\' 6"  (1.676 m)  Wt 170 lb (77.111 kg)  BMI 27.44 kg/m2  General:  alert and no distress   Breasts:  inspection negative, no nipple discharge or bleeding, no masses or nodularity palpable  Lungs: clear to auscultation bilaterally  Heart:  regular rate and rhythm, S1, S2 normal, no murmur, click, rub or gallop  Abdomen: soft, non-tender; bowel sounds normal; no masses,  no organomegaly   Vulva:  normal  Vagina: normal vagina  Cervix:  no lesions  Corpus: normal and anteverted  Adnexa:  normal adnexa  Rectal Exam: Not performed.        Assessment:    Normal postpartum exam. Pap smear done at today's visit.   Plan:    1. Contraception: tubal ligation 2. Advised to get Family Doctor ASAP to monitor HTN and Diabetes. States she does not want to go back to [facility]. Follow up in: 1 year or as  needed.

## 2011-08-06 ENCOUNTER — Telehealth: Payer: Self-pay

## 2011-08-06 DIAGNOSIS — B977 Papillomavirus as the cause of diseases classified elsewhere: Secondary | ICD-10-CM | POA: Insufficient documentation

## 2011-08-06 NOTE — Telephone Encounter (Signed)
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             She probably needs repeat Pap

## 2011-08-09 ENCOUNTER — Emergency Department (HOSPITAL_COMMUNITY)
Admission: EM | Admit: 2011-08-09 | Discharge: 2011-08-09 | Disposition: A | Discharge status: Home / Self Care | Payer: Medicaid Other | Attending: Emergency Medicine | Admitting: Emergency Medicine

## 2011-08-09 ENCOUNTER — Encounter (HOSPITAL_COMMUNITY): Payer: Self-pay | Admitting: Emergency Medicine

## 2011-08-09 DIAGNOSIS — R42 Dizziness and giddiness: Secondary | ICD-10-CM | POA: Insufficient documentation

## 2011-08-09 DIAGNOSIS — M79606 Pain in leg, unspecified: Secondary | ICD-10-CM

## 2011-08-09 DIAGNOSIS — R5381 Other malaise: Secondary | ICD-10-CM | POA: Insufficient documentation

## 2011-08-09 DIAGNOSIS — R531 Weakness: Secondary | ICD-10-CM

## 2011-08-09 DIAGNOSIS — M7989 Other specified soft tissue disorders: Secondary | ICD-10-CM | POA: Insufficient documentation

## 2011-08-09 DIAGNOSIS — M79609 Pain in unspecified limb: Secondary | ICD-10-CM | POA: Insufficient documentation

## 2011-08-09 DIAGNOSIS — R5383 Other fatigue: Secondary | ICD-10-CM | POA: Insufficient documentation

## 2011-08-09 DIAGNOSIS — D649 Anemia, unspecified: Secondary | ICD-10-CM | POA: Insufficient documentation

## 2011-08-09 LAB — CBC WITH DIFFERENTIAL/PLATELET
Basophils Absolute: 0 10*3/uL (ref 0.0–0.1)
Basophils Relative: 0 % (ref 0–1)
HCT: 27.4 % — ABNORMAL LOW (ref 36.0–46.0)
Lymphocytes Relative: 45 % (ref 12–46)
Neutro Abs: 3 10*3/uL (ref 1.7–7.7)
Neutrophils Relative %: 43 % (ref 43–77)
Platelets: 245 10*3/uL (ref 150–400)
RDW: 14.6 % (ref 11.5–15.5)
WBC: 6.9 10*3/uL (ref 4.0–10.5)

## 2011-08-09 LAB — COMPREHENSIVE METABOLIC PANEL
ALT: 14 U/L (ref 0–35)
AST: 17 U/L (ref 0–37)
Albumin: 3.4 g/dL — ABNORMAL LOW (ref 3.5–5.2)
CO2: 20 mEq/L (ref 19–32)
Chloride: 107 mEq/L (ref 96–112)
GFR calc Af Amer: 56 mL/min — ABNORMAL LOW (ref >90)
Potassium: 4.3 mEq/L (ref 3.5–5.1)
Sodium: 137 mEq/L (ref 135–145)

## 2011-08-09 MED ORDER — FERROUS SULFATE 325 (65 FE) MG PO TABS: 325.0000 mg | ORAL_TABLET | Freq: Two times a day (BID) | ORAL | Status: DC

## 2011-08-09 NOTE — ED Notes (Signed)
States Pain currently 7/10 achy sharp bilateral lower extremities.

## 2011-08-09 NOTE — ED Notes (Signed)
Pt. Stated, I've been having pain in my legs and feet for 3 days

## 2011-08-09 NOTE — ED Provider Notes (Signed)
History   This chart was scribed for Veryl Speak, MD by Shona Needles. The patient was seen in room TR06C/TR06C. Patient's care was started at 1021.  CSN: [id number]  Arrival date & time 08/09/11  1021   First MD Initiated Contact with Patient 08/09/11 1131      Chief Complaint  Patient presents with  . Leg Pain   Patient is a 31 y.o. female presenting with leg pain. The history is provided by the patient and a significant other. No language interpreter was used.  Leg Pain    [name] is a 31 y.o. female who presents to the Emergency Department complaining of gradual onset moderate leg and foot pain onset 3 days ago. Pain is described as sharp and shooting, with associate swelling, dizziness, and weakness. Pt reports that after carrying child to term 20 days ago, she has been feeling diffuse general weakness.  Pt has a h/o hyperemesis during pregnancy, anxiety, asthma, and diabetes, reporting blood sugar as 112 this morning.  Past Medical History  Diagnosis Date  . Asthma   . Diabetes mellitus   . Mental disorder   . Depression   . Anxiety   . Hypertension   . Hyperemesis arising during pregnancy     Past Surgical History  Procedure Date  . No past surgeries   . Tubal ligation 06/20/2011    Procedure: POST PARTUM TUBAL LIGATION;  Surgeon: Mora Bellman, MD;  Location: [facility];  Service: Gynecology;  Laterality: Bilateral;  Bilateral post partum tubal ligation    Family History  Problem Relation Age of Onset  . Heart disease Mother     CHF  . Anesthesia problems Neg Hx   . Diabetes Sister     History  Substance Use Topics  . Smoking status: Never Smoker   . Smokeless tobacco: Never Used  . Alcohol Use: No     not with preg    OB History    Grav Para Term Preterm Abortions TAB SAB Ect Mult Living   4 3 2 1 1  0 0 1 0 3      Review of Systems  Constitutional: Negative for fever.  HENT: Negative for rhinorrhea.   Eyes: Negative for pain.  Respiratory:  Positive for cough. Negative for shortness of breath.   Cardiovascular: Positive for leg swelling. Negative for chest pain.  Gastrointestinal: Negative for nausea, vomiting, abdominal pain, diarrhea and constipation.  Genitourinary: Negative for dysuria, frequency and flank pain.  Musculoskeletal: Negative for back pain and gait problem.       Leg pain  Skin: Negative for rash.  Neurological: Positive for dizziness, weakness and headaches. Negative for syncope.    Allergies  Dilaudid and Morphine and related  Home Medications   Current Outpatient Rx  Name Route Sig Dispense Refill  . HYDROCHLOROTHIAZIDE 50 MG PO TABS Oral Take 1 tablet (50 mg total) by mouth daily. 60 tablet 1    BP 140/82  Pulse 74  Temp 97.8 F (36.6 C)  Resp 17  SpO2 100%  Physical Exam  Nursing note and vitals reviewed. Constitutional: She is oriented to person, place, and time. She appears well-developed and well-nourished. No distress.  HENT:  Head: Normocephalic and atraumatic.  Eyes: EOM are normal. Pupils are equal, round, and reactive to light.  Neck: Neck supple. No tracheal deviation present.  Cardiovascular: Normal rate.   Pulmonary/Chest: Effort normal. No respiratory distress.  Abdominal: Soft. She exhibits no distension.  Musculoskeletal: Normal range of motion.  She exhibits no edema.       Both lower extrimites grossly normal. No edema. DP and PT pulses intact. Motor and sensation intact.  Neurological: She is alert and oriented to person, place, and time. No sensory deficit.  Skin: Skin is warm and dry.  Psychiatric: She has a normal mood and affect. Her behavior is normal.    ED Course  Procedures (including critical care time) DIAGNOSTIC STUDIES: Oxygen Saturation is 100% on room air, normal by my interpretation.    COORDINATION OF CARE: 1140- Evaluated Pt. Ordered blood work. 1345- Discussed low hemoglobin count / anemia. 1346- Prepared for discharge.  Labs Reviewed  CBC  WITH DIFFERENTIAL - Abnormal; Notable for the following:    RBC 3.65 (*)     Hemoglobin 8.6 (*)     HCT 27.4 (*)     MCV 75.1 (*)     MCH 23.6 (*)     All other components within normal limits  COMPREHENSIVE METABOLIC PANEL - Abnormal; Notable for the following:    Creatinine, Ser 1.40 (*)     Albumin 3.4 (*)     Total Bilirubin 0.2 (*)     GFR calc non Af Amer 49 (*)     GFR calc Af Amer 56 (*)     All other components within normal limits   No results found.   No diagnosis found.    MDM  This is somewhat of a confusing clinical picture.  She is 6 weeks post-partum and reports pain and swelling in her legs and generalized weakness.  Laboratory studies show a persistent anemia that was present in the prenatal period as well.  The mcv is low and I suspect she is iron deficient.  As no other cause was found, she will be given a prescription for iron, discharged to home with follow up with pcp.       I personally performed the services described in this documentation, which was scribed in my presence. The recorded information has been reviewed and considered.     Veryl Speak, MD 08/10/11 [phone]

## 2011-08-09 NOTE — ED Notes (Addendum)
Patient ambulatory steady gait walked into room Triage 6 with tech.  Stated needed to find her 2 month baby and family member asked if need assistance stated she will look walked back to waiting room then returned could not find them.  Walked back to waiting room a second time found baby carried baby to room.  Patient resting comfortably on stretcher.

## 2011-08-09 NOTE — Telephone Encounter (Signed)
Called [name]'s home number- spoke to a female who identified herself as [name]'s sister and said [name] doesn't live there, and doesn't have a phone- will give message to [place] to call clinic.

## 2011-08-11 NOTE — Telephone Encounter (Signed)
Will notify Cathlean Marseilles to please send a certified letter.

## 2011-08-12 ENCOUNTER — Encounter: Payer: Self-pay | Admitting: *Deleted

## 2011-08-16 ENCOUNTER — Emergency Department (HOSPITAL_COMMUNITY): Payer: Medicaid Other

## 2011-08-16 ENCOUNTER — Emergency Department (HOSPITAL_COMMUNITY)
Admission: EM | Admit: 2011-08-16 | Discharge: 2011-08-16 | Disposition: A | Discharge status: Home / Self Care | Payer: Medicaid Other | Attending: Emergency Medicine | Admitting: Emergency Medicine

## 2011-08-16 ENCOUNTER — Encounter (HOSPITAL_COMMUNITY): Payer: Self-pay | Admitting: *Deleted

## 2011-08-16 DIAGNOSIS — IMO0002 Reserved for concepts with insufficient information to code with codable children: Secondary | ICD-10-CM | POA: Insufficient documentation

## 2011-08-16 DIAGNOSIS — M792 Neuralgia and neuritis, unspecified: Secondary | ICD-10-CM

## 2011-08-16 DIAGNOSIS — M79609 Pain in unspecified limb: Secondary | ICD-10-CM | POA: Insufficient documentation

## 2011-08-16 DIAGNOSIS — R51 Headache: Secondary | ICD-10-CM | POA: Insufficient documentation

## 2011-08-16 DIAGNOSIS — R42 Dizziness and giddiness: Secondary | ICD-10-CM | POA: Insufficient documentation

## 2011-08-16 LAB — CBC WITH DIFFERENTIAL/PLATELET
Basophils Absolute: 0 10*3/uL (ref 0.0–0.1)
HCT: 28.6 % — ABNORMAL LOW (ref 36.0–46.0)
Hemoglobin: 9.1 g/dL — ABNORMAL LOW (ref 12.0–15.0)
Lymphocytes Relative: 52 % — ABNORMAL HIGH (ref 12–46)
Lymphs Abs: 3.1 10*3/uL (ref 0.7–4.0)
Monocytes Absolute: 0.5 10*3/uL (ref 0.1–1.0)
Neutro Abs: 2.1 10*3/uL (ref 1.7–7.7)
RBC: 3.83 MIL/uL — ABNORMAL LOW (ref 3.87–5.11)
RDW: 15.2 % (ref 11.5–15.5)
WBC: 5.9 10*3/uL (ref 4.0–10.5)

## 2011-08-16 LAB — COMPREHENSIVE METABOLIC PANEL
ALT: 15 U/L (ref 0–35)
AST: 21 U/L (ref 0–37)
CO2: 22 mEq/L (ref 19–32)
Chloride: 108 mEq/L (ref 96–112)
Creatinine, Ser: 1.18 mg/dL — ABNORMAL HIGH (ref 0.50–1.10)
GFR calc non Af Amer: 60 mL/min — ABNORMAL LOW (ref >90)
Total Bilirubin: 0.1 mg/dL — ABNORMAL LOW (ref 0.3–1.2)

## 2011-08-16 MED ORDER — HYDROCODONE-ACETAMINOPHEN 5-325 MG PO TABS
1.0000 | ORAL_TABLET | Freq: Once | ORAL | Status: AC
  Administered 2011-08-16: 1 via ORAL
  Filled 2011-08-16: qty 1

## 2011-08-16 MED ORDER — OXYCODONE-ACETAMINOPHEN 5-325 MG PO TABS: 1.0000 | ORAL_TABLET | Freq: Three times a day (TID) | ORAL | Status: AC | PRN

## 2011-08-16 MED ORDER — OXYCODONE-ACETAMINOPHEN 5-325 MG PO TABS
1.0000 | ORAL_TABLET | Freq: Once | ORAL | Status: AC
  Administered 2011-08-16: 1 via ORAL
  Filled 2011-08-16: qty 1

## 2011-08-16 NOTE — ED Notes (Signed)
Pt states that she just had a baby 2 months ago and has been tired states that they put her on iron last week when she was seen but not feeling better also staes that her period has come on again after just stopping last wek

## 2011-08-16 NOTE — ED Notes (Signed)
Pt reports left foot pain x 1 week, reports was here for the same last week, was told to take her iron pills, states it is not helping, pt able to wiggle toes and mild limp with ambulation.

## 2011-08-16 NOTE — ED Notes (Signed)
[place], MD at bedside discussing CT results with the pt, pt given a meal

## 2011-08-16 NOTE — ED Provider Notes (Signed)
History  This chart was scribed for Maudry Diego, MD by Kathreen Cornfield. The patient was seen in room TR08C/TR08C and the patient's care was started at 12:22 PM       CSN: [id number]  Arrival date & time 08/16/11  1021   None     Chief Complaint  Patient presents with  . Foot Pain    (Consider location/radiation/quality/duration/timing/severity/associated sxs/prior treatment) Patient is a 31 y.o. female presenting with lower extremity pain. The history is provided by the patient. No language interpreter was used.  Foot Pain This is a new problem. The current episode started more than 1 week ago. The problem occurs constantly. The problem has not changed since onset.Associated symptoms include headaches. Pertinent negatives include no chest pain and no abdominal pain. Associated symptoms comments: Dizziness. . The symptoms are aggravated by exertion. Nothing relieves the symptoms. She has tried nothing for the symptoms. The treatment provided no relief.   Pt has a hx of diabetes, taking ferrous sulfate tablets.  PCP is Dr. Elly Modena.   Past Medical History  Diagnosis Date  . Asthma   . Diabetes mellitus   . Mental disorder   . Depression   . Anxiety   . Hypertension   . Hyperemesis arising during pregnancy     Past Surgical History  Procedure Date  . No past surgeries   . Tubal ligation 06/20/2011    Procedure: POST PARTUM TUBAL LIGATION;  Surgeon: Mora Bellman, MD;  Location: [facility];  Service: Gynecology;  Laterality: Bilateral;  Bilateral post partum tubal ligation    Family History  Problem Relation Age of Onset  . Heart disease Mother     CHF  . Anesthesia problems Neg Hx   . Diabetes Sister     History  Substance Use Topics  . Smoking status: Never Smoker   . Smokeless tobacco: Never Used  . Alcohol Use: No     not with preg    OB History    Grav Para Term Preterm Abortions TAB SAB Ect Mult Living   4 3 2 1 1  0 0 1 0 3      Review of Systems    Cardiovascular: Negative for chest pain.  Gastrointestinal: Negative for abdominal pain.  Neurological: Positive for headaches.  All other systems reviewed and are negative.    Allergies  Dilaudid and Morphine and related  Home Medications   Current Outpatient Rx  Name Route Sig Dispense Refill  . FERROUS SULFATE 325 (65 FE) MG PO TABS Oral Take 1 tablet (325 mg total) by mouth 2 (two) times daily. 60 tablet 0  . HYDROCHLOROTHIAZIDE 50 MG PO TABS Oral Take 1 tablet (50 mg total) by mouth daily. 60 tablet 1    BP 112/72  Pulse 82  Temp 98 F (36.7 C) (Oral)  Resp 16  SpO2 100%  Physical Exam  Nursing note and vitals reviewed. Constitutional: She is oriented to person, place, and time. She appears well-developed.  HENT:  Head: Normocephalic.  Eyes: Conjunctivae are normal.  Neck: No tracheal deviation present.  Cardiovascular:  No murmur heard. Musculoskeletal: Normal range of motion.  Neurological: She is oriented to person, place, and time.  Skin: Skin is warm.  Psychiatric: She has a normal mood and affect.    ED Course  Procedures (including critical care time)  DIAGNOSTIC STUDIES: Oxygen Saturation is 100% on room air, normal by my interpretation.    COORDINATION OF CARE:    12:24PM- EDP at bedside  discusses treatment plan concerning evaluation of previous laboratory exams.   2:05PM- EDP at bedside discusses laboratory examination results, evaluation of anemia. Pt fiance requests the pt have a ct scan performed. EDP discusses referral to neurologist.   3:15PM- EDP at bedside discusses results of ct scan, pain management, follow up with [facility].   Results for orders placed during the hospital encounter of 08/16/11  CBC WITH DIFFERENTIAL      Component Value Range   WBC 5.9  4.0 - 10.5 K/uL   RBC 3.83 (*) 3.87 - 5.11 MIL/uL   Hemoglobin 9.1 (*) 12.0 - 15.0 g/dL   HCT 28.6 (*) 36.0 - 46.0 %   MCV 74.7 (*) 78.0 - 100.0 fL   MCH  23.8 (*) 26.0 - 34.0 pg   MCHC 31.8  30.0 - 36.0 g/dL   RDW 15.2  11.5 - 15.5 %   Platelets 278  150 - 400 K/uL   Neutrophils Relative 36 (*) 43 - 77 %   Neutro Abs 2.1  1.7 - 7.7 K/uL   Lymphocytes Relative 52 (*) 12 - 46 %   Lymphs Abs 3.1  0.7 - 4.0 K/uL   Monocytes Relative 8  3 - 12 %   Monocytes Absolute 0.5  0.1 - 1.0 K/uL   Eosinophils Relative 3  0 - 5 %   Eosinophils Absolute 0.2  0.0 - 0.7 K/uL   Basophils Relative 1  0 - 1 %   Basophils Absolute 0.0  0.0 - 0.1 K/uL  COMPREHENSIVE METABOLIC PANEL      Component Value Range   Sodium 140  135 - 145 mEq/L   Potassium 4.7  3.5 - 5.1 mEq/L   Chloride 108  96 - 112 mEq/L   CO2 22  19 - 32 mEq/L   Glucose, Bld 86  70 - 99 mg/dL   BUN 28 (*) 6 - 23 mg/dL   Creatinine, Ser 1.18 (*) 0.50 - 1.10 mg/dL   Calcium 9.4  8.4 - 10.5 mg/dL   Total Protein 8.0  6.0 - 8.3 g/dL   Albumin 3.4 (*) 3.5 - 5.2 g/dL   AST 21  0 - 37 U/L   ALT 15  0 - 35 U/L   Alkaline Phosphatase 85  39 - 117 U/L   Total Bilirubin 0.1 (*) 0.3 - 1.2 mg/dL   GFR calc non Af Amer 60 (*) >90 mL/min   GFR calc Af Amer 69 (*) >90 mL/min   Ct Head Wo Contrast  08/16/2011  *RADIOLOGY REPORT*  Clinical Data: Dizziness, headache.  CT HEAD WITHOUT CONTRAST  Technique:  Contiguous axial images were obtained from the base of the skull through the vertex without contrast.  Comparison: 10/10/2007  Findings: No acute intracranial abnormality.  Specifically, no hemorrhage, hydrocephalus, mass lesion, acute infarction, or significant intracranial injury.  No acute calvarial abnormality. Visualized paranasal sinuses and mastoids clear.  Orbital soft tissues unremarkable.  IMPRESSION: Normal study.  Original Report Authenticated By: Raelyn Number, M.D.      No diagnosis found.    MDM        The chart was scribed for me under my direct supervision.  I personally performed the history, physical, and medical decision making and all procedures in the evaluation of this  patient.Maudry Diego, MD 08/16/11 [phone]

## 2011-10-12 ENCOUNTER — Encounter (HOSPITAL_COMMUNITY): Payer: Self-pay | Admitting: Emergency Medicine

## 2011-10-12 ENCOUNTER — Emergency Department (HOSPITAL_COMMUNITY)
Admission: EM | Admit: 2011-10-12 | Discharge: 2011-10-12 | Disposition: A | Discharge status: Home / Self Care | Payer: Self-pay | Attending: Emergency Medicine | Admitting: Emergency Medicine

## 2011-10-12 ENCOUNTER — Emergency Department (HOSPITAL_COMMUNITY): Payer: Self-pay

## 2011-10-12 DIAGNOSIS — R1110 Vomiting, unspecified: Secondary | ICD-10-CM | POA: Insufficient documentation

## 2011-10-12 DIAGNOSIS — R109 Unspecified abdominal pain: Secondary | ICD-10-CM

## 2011-10-12 LAB — URINALYSIS, ROUTINE W REFLEX MICROSCOPIC
Bilirubin Urine: NEGATIVE
Nitrite: NEGATIVE
Specific Gravity, Urine: 1.022 (ref 1.005–1.030)
Urobilinogen, UA: 0.2 mg/dL (ref 0.0–1.0)

## 2011-10-12 LAB — CBC WITH DIFFERENTIAL/PLATELET
Basophils Relative: 1 % (ref 0–1)
Eosinophils Absolute: 0.2 10*3/uL (ref 0.0–0.7)
MCH: 23.3 pg — ABNORMAL LOW (ref 26.0–34.0)
MCHC: 31.7 g/dL (ref 30.0–36.0)
Neutrophils Relative %: 48 % (ref 43–77)
Platelets: 340 10*3/uL (ref 150–400)
RBC: 4.07 MIL/uL (ref 3.87–5.11)

## 2011-10-12 LAB — BASIC METABOLIC PANEL
GFR calc Af Amer: 52 mL/min — ABNORMAL LOW (ref >90)
GFR calc non Af Amer: 45 mL/min — ABNORMAL LOW (ref >90)
Potassium: 4.6 mEq/L (ref 3.5–5.1)
Sodium: 138 mEq/L (ref 135–145)

## 2011-10-12 LAB — LIPASE, BLOOD: Lipase: 47 U/L (ref 11–59)

## 2011-10-12 MED ORDER — OXYCODONE-ACETAMINOPHEN 5-325 MG PO TABS
1.0000 | ORAL_TABLET | Freq: Once | ORAL | Status: AC
  Administered 2011-10-12: 1 via ORAL
  Filled 2011-10-12: qty 1

## 2011-10-12 MED ORDER — FENTANYL CITRATE 0.05 MG/ML IJ SOLN: 50.0000 ug | Freq: Once | INTRAMUSCULAR | Status: DC

## 2011-10-12 MED ORDER — HYDROMORPHONE HCL PF 1 MG/ML IJ SOLN
1.0000 mg | Freq: Once | INTRAMUSCULAR | Status: AC
  Administered 2011-10-12: 1 mg via INTRAMUSCULAR
  Filled 2011-10-12: qty 1

## 2011-10-12 MED ORDER — ONDANSETRON 8 MG PO TBDP: 8.0000 mg | ORAL_TABLET | Freq: Three times a day (TID) | ORAL | Status: DC | PRN

## 2011-10-12 MED ORDER — SODIUM CHLORIDE 0.9 % IV BOLUS (SEPSIS)
1000.0000 mL | Freq: Once | INTRAVENOUS | Status: AC
  Administered 2011-10-12: 1000 mL via INTRAVENOUS

## 2011-10-12 MED ORDER — HYDROCODONE-ACETAMINOPHEN 5-325 MG PO TABS: 1.0000 | ORAL_TABLET | Freq: Four times a day (QID) | ORAL | Status: DC | PRN

## 2011-10-12 NOTE — ED Provider Notes (Signed)
2:36 PM Pt in CDU holding.  Sign out received from Gardiner Fanti, PA-C.  Patient left sided abdominal pain x 4 days, with N/V/D since yesterday.  No fevers, no blood in her stool or emesis.  Pt was to be discharged but had a measured blood pressure of 85 systolic.  Plan is for xray of abdomen, IV and PO fluids, monitoring of blood pressure.  Once blood pressure improves, patient to be discharged home.   Patient currently having IV placed by IV team nurse.    Filed Vitals:   10/12/11 1345  BP: 113/88  Pulse: 63  Temp:   Resp: 32    3:29 PM Patient in CDU holding.  Reexamination of abdomen:  Abdomen is soft, nondistended, nontender to palpation, no guarding, no rebound.  Plan is for abdominal xray, IVF, PO challenge, recheck blood pressure.  If maintaining blood pressure, pt may be discharged home.  Pt has been seen by Saintclair Halsted, community liaison, and has been given resources for PCP follow up.    Pt discussed with Etta Quill, NP, who assumes care of patient at change of shift.    Results for orders placed during the hospital encounter of 10/12/11  CBC WITH DIFFERENTIAL      Component Value Range   WBC 6.2  4.0 - 10.5 K/uL   RBC 4.07  3.87 - 5.11 MIL/uL   Hemoglobin 9.5 (*) 12.0 - 15.0 g/dL   HCT 30.0 (*) 36.0 - 46.0 %   MCV 73.7 (*) 78.0 - 100.0 fL   MCH 23.3 (*) 26.0 - 34.0 pg   MCHC 31.7  30.0 - 36.0 g/dL   RDW 15.5  11.5 - 15.5 %   Platelets 340  150 - 400 K/uL   Neutrophils Relative 48  43 - 77 %   Neutro Abs 3.0  1.7 - 7.7 K/uL   Lymphocytes Relative 43  12 - 46 %   Lymphs Abs 2.7  0.7 - 4.0 K/uL   Monocytes Relative 6  3 - 12 %   Monocytes Absolute 0.4  0.1 - 1.0 K/uL   Eosinophils Relative 3  0 - 5 %   Eosinophils Absolute 0.2  0.0 - 0.7 K/uL   Basophils Relative 1  0 - 1 %   Basophils Absolute 0.0  0.0 - 0.1 K/uL  BASIC METABOLIC PANEL      Component Value Range   Sodium 138  135 - 145 mEq/L   Potassium 4.6  3.5 - 5.1 mEq/L   Chloride 106  96 - 112 mEq/L   CO2  20  19 - 32 mEq/L   Glucose, Bld 95  70 - 99 mg/dL   BUN 35 (*) 6 - 23 mg/dL   Creatinine, Ser 1.50 (*) 0.50 - 1.10 mg/dL   Calcium 9.7  8.4 - 10.5 mg/dL   GFR calc non Af Amer 45 (*) >90 mL/min   GFR calc Af Amer 52 (*) >90 mL/min  URINALYSIS, ROUTINE W REFLEX MICROSCOPIC      Component Value Range   Color, Urine YELLOW  YELLOW   APPearance CLEAR  CLEAR   Specific Gravity, Urine 1.022  1.005 - 1.030   pH 6.0  5.0 - 8.0   Glucose, UA NEGATIVE  NEGATIVE mg/dL   Hgb urine dipstick NEGATIVE  NEGATIVE   Bilirubin Urine NEGATIVE  NEGATIVE   Ketones, ur NEGATIVE  NEGATIVE mg/dL   Protein, ur NEGATIVE  NEGATIVE mg/dL   Urobilinogen, UA 0.2  0.0 - 1.0  mg/dL   Nitrite NEGATIVE  NEGATIVE   Leukocytes, UA NEGATIVE  NEGATIVE  LIPASE, BLOOD      Component Value Range   Lipase 47  11 - 59 U/L  POCT PREGNANCY, URINE      Component Value Range   Preg Test, Ur NEGATIVE  NEGATIVE   No results found.    [place], [place] 10/12/11 [phone]

## 2011-10-12 NOTE — ED Notes (Addendum)
Per EMS: pt from home c/o N/V/D x 4 days with lower abd pain; pt 3 months post partum

## 2011-10-12 NOTE — ED Provider Notes (Signed)
Patient moved to CDU holding pending completion of IV fluids in the treatment of an episode of hypotension due to abdominal pain and vomiting.  Patient has completed fluids, is currently normotensive without tachycardia.  Abdomen soft, bowel sounds present, diffuse tenderness to LUQ and LLQ.  Lab and radiology results reviewed and discussed with patient.  Patient does not currently have a primary care provider, [facility].  Resource information provided.  Currently tolerating po fluids and pretzels.  Will discharge home with pain medication and anti-emetic.   Norman Herrlich, NP 10/12/11 1952

## 2011-10-12 NOTE — ED Provider Notes (Signed)
Medical screening examination/treatment/procedure(s) were performed by non-physician practitioner and as supervising physician I was immediately available for consultation/collaboration.    Dot Lanes, MD 10/12/11 [phone]

## 2011-10-19 NOTE — ED Provider Notes (Signed)
Medical screening examination/treatment/procedure(s) were performed by non-physician practitioner and as supervising physician I was immediately available for consultation/collaboration.  Virgel Manifold, MD 10/19/11 [phone]

## 2011-11-21 NOTE — ED Provider Notes (Signed)
History     CSN: [id number]  Arrival date & time 10/12/11  1013   First MD Initiated Contact with Patient 10/12/11 1120      Chief Complaint  Patient presents with  . Emesis  . Abdominal Pain    (Consider location/radiation/quality/duration/timing/severity/associated sxs/prior treatment) Patient is a 31 y.o. female presenting with vomiting and abdominal pain. The history is provided by the patient.  Emesis  This is a new problem. Associated symptoms include abdominal pain and diarrhea. Pertinent negatives include no chills and no fever. Associated symptoms comments: She complains of N, V, D without fever for 4 days. She is having some lower, predominantly left, abdominal pain but reports this has been present without change since birth of her child 3 months ago. No vaginal discharge, dysuria..  Abdominal Pain The primary symptoms of the illness include abdominal pain, nausea, vomiting and diarrhea. The primary symptoms of the illness do not include fever, shortness of breath, dysuria, vaginal discharge or vaginal bleeding.  Symptoms associated with the illness do not include chills.    Past Medical History  Diagnosis Date  . Asthma   . Diabetes mellitus   . Mental disorder   . Depression   . Anxiety   . Hypertension   . Hyperemesis arising during pregnancy     Past Surgical History  Procedure Date  . No past surgeries   . Tubal ligation 06/20/2011    Procedure: POST PARTUM TUBAL LIGATION;  Surgeon: Mora Bellman, MD;  Location: [facility];  Service: Gynecology;  Laterality: Bilateral;  Bilateral post partum tubal ligation    Family History  Problem Relation Age of Onset  . Heart disease Mother     CHF  . Anesthesia problems Neg Hx   . Diabetes Sister     History  Substance Use Topics  . Smoking status: Never Smoker   . Smokeless tobacco: Never Used  . Alcohol Use: No     Comment: not with preg    OB History    Grav Para Term Preterm Abortions TAB SAB Ect Mult  Living   4 3 2 1 1  0 0 1 0 3      Review of Systems  Constitutional: Negative for fever and chills.  HENT: Negative.   Respiratory: Negative.  Negative for shortness of breath.   Cardiovascular: Negative.  Negative for chest pain.  Gastrointestinal: Positive for nausea, vomiting, abdominal pain and diarrhea.  Genitourinary: Negative.  Negative for dysuria, vaginal bleeding and vaginal discharge.  Musculoskeletal: Negative.   Skin: Negative.   Neurological: Negative.  Negative for light-headedness.    Allergies  Morphine and related  Home Medications   Current Outpatient Rx  Name  Route  Sig  Dispense  Refill  . ALBUTEROL SULFATE HFA 108 (90 BASE) MCG/ACT IN AERS   Inhalation   Inhale 2 puffs into the lungs every 6 (six) hours as needed. For shortness of breath         . FERROUS SULFATE 325 (65 FE) MG PO TABS   Oral   Take 1 tablet (325 mg total) by mouth 2 (two) times daily.   60 tablet   0   . HYDROCHLOROTHIAZIDE 50 MG PO TABS   Oral   Take 1 tablet (50 mg total) by mouth daily.   60 tablet   1   . HYDROCODONE-ACETAMINOPHEN 5-325 MG PO TABS   Oral   Take 1 tablet by mouth every 6 (six) hours as needed for pain.   15  tablet   0   . ONDANSETRON 8 MG PO TBDP   Oral   Take 1 tablet (8 mg total) by mouth every 8 (eight) hours as needed for nausea.   20 tablet   0     BP 107/59  Pulse 72  Temp 97.9 F (36.6 C) (Oral)  Resp 18  SpO2 100%  LMP 10/05/2011  Physical Exam  Constitutional: She is oriented to person, place, and time. She appears well-developed and well-nourished.  HENT:  Head: Normocephalic.  Neck: Normal range of motion. Neck supple.  Cardiovascular: Normal rate and regular rhythm.   Pulmonary/Chest: Effort normal and breath sounds normal.  Abdominal: Soft. Bowel sounds are normal. She exhibits no mass. There is tenderness. There is no rebound and no guarding.       Mild tenderness in lower abdomen.  Musculoskeletal: Normal range of  motion.  Neurological: She is alert and oriented to person, place, and time.  Skin: Skin is warm and dry. No rash noted.  Psychiatric: She has a normal mood and affect.    ED Course  Procedures (including critical care time)  Labs Reviewed  CBC WITH DIFFERENTIAL - Abnormal; Notable for the following:    Hemoglobin 9.5 (*)     HCT 30.0 (*)     MCV 73.7 (*)     MCH 23.3 (*)     All other components within normal limits  BASIC METABOLIC PANEL - Abnormal; Notable for the following:    BUN 35 (*)     Creatinine, Ser 1.50 (*)     GFR calc non Af Amer 45 (*)     GFR calc Af Amer 52 (*)     All other components within normal limits  URINALYSIS, ROUTINE W REFLEX MICROSCOPIC  LIPASE, BLOOD  POCT PREGNANCY, URINE  LAB REPORT - SCANNED   No results found.   1. Abdominal pain       MDM  Patient's vomiting is controlled. Moved to CDU to be rehydrated, improve blood pressure and continue to observe for recurrent vomiting.        Leotis Shames, PA-C 11/21/11 [phone]

## 2011-11-22 NOTE — ED Provider Notes (Signed)
Medical screening examination/treatment/procedure(s) were performed by non-physician practitioner and as supervising physician I was immediately available for consultation/collaboration.    Dot Lanes, MD 11/22/11 [phone]

## 2011-12-13 ENCOUNTER — Encounter (HOSPITAL_COMMUNITY): Payer: Self-pay | Admitting: Emergency Medicine

## 2011-12-13 ENCOUNTER — Emergency Department (HOSPITAL_COMMUNITY)
Admission: EM | Admit: 2011-12-13 | Discharge: 2011-12-13 | Disposition: A | Discharge status: Home / Self Care | Payer: Self-pay | Attending: Emergency Medicine | Admitting: Emergency Medicine

## 2011-12-13 DIAGNOSIS — R064 Hyperventilation: Secondary | ICD-10-CM | POA: Insufficient documentation

## 2011-12-13 DIAGNOSIS — E119 Type 2 diabetes mellitus without complications: Secondary | ICD-10-CM | POA: Insufficient documentation

## 2011-12-13 DIAGNOSIS — F411 Generalized anxiety disorder: Secondary | ICD-10-CM | POA: Insufficient documentation

## 2011-12-13 DIAGNOSIS — R209 Unspecified disturbances of skin sensation: Secondary | ICD-10-CM | POA: Insufficient documentation

## 2011-12-13 DIAGNOSIS — R42 Dizziness and giddiness: Secondary | ICD-10-CM | POA: Insufficient documentation

## 2011-12-13 DIAGNOSIS — IMO0001 Reserved for inherently not codable concepts without codable children: Secondary | ICD-10-CM | POA: Insufficient documentation

## 2011-12-13 DIAGNOSIS — F419 Anxiety disorder, unspecified: Secondary | ICD-10-CM

## 2011-12-13 DIAGNOSIS — Z79899 Other long term (current) drug therapy: Secondary | ICD-10-CM | POA: Insufficient documentation

## 2011-12-13 DIAGNOSIS — J45909 Unspecified asthma, uncomplicated: Secondary | ICD-10-CM | POA: Insufficient documentation

## 2011-12-13 DIAGNOSIS — F3289 Other specified depressive episodes: Secondary | ICD-10-CM | POA: Insufficient documentation

## 2011-12-13 DIAGNOSIS — F329 Major depressive disorder, single episode, unspecified: Secondary | ICD-10-CM | POA: Insufficient documentation

## 2011-12-13 DIAGNOSIS — I10 Essential (primary) hypertension: Secondary | ICD-10-CM | POA: Insufficient documentation

## 2011-12-13 LAB — GLUCOSE, CAPILLARY: Glucose-Capillary: 141 mg/dL — ABNORMAL HIGH (ref 70–99)

## 2011-12-13 MED ORDER — IBUPROFEN 800 MG PO TABS
800.0000 mg | ORAL_TABLET | Freq: Once | ORAL | Status: AC
  Administered 2011-12-13: 800 mg via ORAL
  Filled 2011-12-13: qty 1

## 2011-12-13 NOTE — ED Provider Notes (Signed)
Medical screening examination/treatment/procedure(s) were performed by non-physician practitioner and as supervising physician I was immediately available for consultation/collaboration.  Teressa Lower, MD 12/13/11 [phone]

## 2011-12-13 NOTE — ED Notes (Signed)
Pt. Presents to ED with generalized body pain for four days and dizziness for one month.  Pt. Has hx. Of anxiety, depression and diabetes.  Pt. Appears depressed and agitated at this time.

## 2011-12-13 NOTE — ED Provider Notes (Signed)
History     CSN: [id number]  Arrival date & time 12/13/11  [id number]   First MD Initiated Contact with Patient 12/13/11 0500      Chief Complaint  Patient presents with  . Muscle Pain  . Dizziness   HPI  History provided by the patient. Patient is a 31 year old female with history of gestational diabetes, hypertension, anxiety and asthma who presents with complaints of general body aches and tingling to her fingers and toes with occasional lightheaded symptoms. Symptoms have been present off-and-on for the past month or more. Patient does report having some increased family stress and feels anxious at times. She denies any significant depression no SI or HI. Patient was treated for gestational diabetes but reports being discontinued from medications after her son was born. Patient states that tingling and lightheadedness symptoms are worse with hyperventilation and rapid breathing. Currently patient denies any symptoms.     Past Medical History  Diagnosis Date  . Asthma   . Diabetes mellitus   . Mental disorder   . Depression   . Anxiety   . Hypertension   . Hyperemesis arising during pregnancy     Past Surgical History  Procedure Date  . No past surgeries   . Tubal ligation 06/20/2011    Procedure: POST PARTUM TUBAL LIGATION;  Surgeon: Mora Bellman, MD;  Location: [facility];  Service: Gynecology;  Laterality: Bilateral;  Bilateral post partum tubal ligation    Family History  Problem Relation Age of Onset  . Heart disease Mother     CHF  . Anesthesia problems Neg Hx   . Diabetes Sister     History  Substance Use Topics  . Smoking status: Never Smoker   . Smokeless tobacco: Never Used  . Alcohol Use: Yes     Comment: occassionally    OB History    Grav Para Term Preterm Abortions TAB SAB Ect Mult Living   4 3 2 1 1  0 0 1 0 3      Review of Systems  Constitutional: Negative for fever, chills, diaphoresis and appetite change.  Respiratory: Negative for cough and  shortness of breath.   Cardiovascular: Negative for chest pain and palpitations.  Gastrointestinal: Negative for vomiting, abdominal pain and diarrhea.  Genitourinary: Negative for dysuria, frequency, hematuria, flank pain, vaginal bleeding, vaginal discharge and menstrual problem.  Neurological: Positive for dizziness and light-headedness. Negative for weakness.  All other systems reviewed and are negative.    Allergies  Morphine and related  Home Medications   Current Outpatient Rx  Name  Route  Sig  Dispense  Refill  . ALBUTEROL SULFATE HFA 108 (90 BASE) MCG/ACT IN AERS   Inhalation   Inhale 2 puffs into the lungs every 6 (six) hours as needed. For shortness of breath         . FERROUS SULFATE 325 (65 FE) MG PO TABS   Oral   Take 1 tablet (325 mg total) by mouth 2 (two) times daily.   60 tablet   0   . HYDROCHLOROTHIAZIDE 50 MG PO TABS   Oral   Take 1 tablet (50 mg total) by mouth daily.   60 tablet   1   . HYDROCODONE-ACETAMINOPHEN 5-325 MG PO TABS   Oral   Take 1 tablet by mouth every 6 (six) hours as needed for pain.   15 tablet   0   . ONDANSETRON 8 MG PO TBDP   Oral   Take 1 tablet (8 mg  total) by mouth every 8 (eight) hours as needed for nausea.   20 tablet   0     LMP 11/29/2011  Breastfeeding? No  Physical Exam  Nursing note and vitals reviewed. Constitutional: She is oriented to person, place, and time. She appears well-developed and well-nourished. No distress.  HENT:  Head: Normocephalic and atraumatic.  Eyes: Conjunctivae normal and EOM are normal. Pupils are equal, round, and reactive to light.  Cardiovascular: Normal rate and regular rhythm.   No murmur heard. Pulmonary/Chest: Effort normal and breath sounds normal. No respiratory distress. She has no wheezes. She has no rales.  Abdominal: Soft. There is no tenderness. There is no rebound and no guarding.  Musculoskeletal: Normal range of motion. She exhibits no edema and no tenderness.    Neurological: She is alert and oriented to person, place, and time. She has normal strength. No cranial nerve deficit or sensory deficit. Gait normal.  Skin: Skin is warm and dry. No rash noted.  Psychiatric: She has a normal mood and affect. Her behavior is normal.    ED Course  Procedures   Results for orders placed during the hospital encounter of 12/13/11  GLUCOSE, CAPILLARY      Component Value Range   Glucose-Capillary 141 (*) 70 - 99 mg/dL       1. Lightheadedness   2. Hyperventilation   3. Anxiety       MDM  5:00 AM patient seen and evaluated. Patient is currently well appearing in no acute distress.  Just prior to patient checking and I was evaluating her 16-month-old son. Patient appeared well and gave appropriate history first son symptoms she did not seem ill or in any emergent condition. Patient and her husband did ask her bus passes prior to leaving. Patient stated to me that she checked in only after she may sure her son was doing okay.  Patient does not appear to have any symptoms or signs of emergent condition. Symptoms have been chronic. She has good vital signs. No significant hyperglycemia. At this time patient felt stable for discharge home and encouraged to followup with PCP.      Martie Lee, PA 12/13/11 0530

## 2012-01-17 ENCOUNTER — Emergency Department (HOSPITAL_COMMUNITY)
Admission: EM | Admit: 2012-01-17 | Discharge: 2012-01-17 | Disposition: A | Discharge status: Home / Self Care | Payer: Self-pay | Attending: Emergency Medicine | Admitting: Emergency Medicine

## 2012-01-17 ENCOUNTER — Emergency Department (HOSPITAL_COMMUNITY): Payer: Self-pay

## 2012-01-17 DIAGNOSIS — R079 Chest pain, unspecified: Secondary | ICD-10-CM | POA: Insufficient documentation

## 2012-01-17 DIAGNOSIS — I10 Essential (primary) hypertension: Secondary | ICD-10-CM | POA: Insufficient documentation

## 2012-01-17 DIAGNOSIS — E119 Type 2 diabetes mellitus without complications: Secondary | ICD-10-CM | POA: Insufficient documentation

## 2012-01-17 DIAGNOSIS — F411 Generalized anxiety disorder: Secondary | ICD-10-CM | POA: Insufficient documentation

## 2012-01-17 DIAGNOSIS — J45909 Unspecified asthma, uncomplicated: Secondary | ICD-10-CM | POA: Insufficient documentation

## 2012-01-17 DIAGNOSIS — Z8659 Personal history of other mental and behavioral disorders: Secondary | ICD-10-CM | POA: Insufficient documentation

## 2012-01-17 MED ORDER — OXYCODONE-ACETAMINOPHEN 5-325 MG PO TABS
1.0000 | ORAL_TABLET | Freq: Once | ORAL | Status: AC
  Administered 2012-01-17: 1 via ORAL
  Filled 2012-01-17: qty 1

## 2012-01-17 NOTE — ED Notes (Signed)
Patient transported by [facility] for left sided chest pain x 2 days.  Patient has a history of anxiety problems.  Patient has been upset this evening.  Patient alert and oriented.  12-lead unremarkable per EMS.

## 2012-01-17 NOTE — ED Provider Notes (Signed)
History     CSN: [id number]  Arrival date & time 01/17/12  0134   First MD Initiated Contact with Patient 01/17/12 0157      Chief Complaint  Patient presents with  . Chest Pain     Patient is a 31 y.o. female presenting with chest pain. The history is provided by the patient.  Chest Pain The chest pain began 2 days ago. Chest pain occurs constantly. The chest pain is unchanged. Associated with: nothing. The severity of the pain is moderate. The quality of the pain is described as pressure-like. The pain does not radiate. Pertinent negatives for primary symptoms include no fever, no syncope, no shortness of breath, no abdominal pain and no vomiting.  Pertinent negatives for associated symptoms include no diaphoresis and no weakness. Treatments tried: rest.   pt reports CP for 2 days It is constant Nothing worsens pain No sob/dizziness/nausea/vomiting/diaphoresis No pleuritic pain is reported Denies known h/o DVT   Past Medical History  Diagnosis Date  . Asthma   . Diabetes mellitus   . Mental disorder   . Depression   . Anxiety   . Hypertension   . Hyperemesis arising during pregnancy     Past Surgical History  Procedure Date  . No past surgeries   . Tubal ligation 06/20/2011    Procedure: POST PARTUM TUBAL LIGATION;  Surgeon: Mora Bellman, MD;  Location: [facility];  Service: Gynecology;  Laterality: Bilateral;  Bilateral post partum tubal ligation    Family History  Problem Relation Age of Onset  . Heart disease Mother     CHF  . Anesthesia problems Neg Hx   . Diabetes Sister     History  Substance Use Topics  . Smoking status: Never Smoker   . Smokeless tobacco: Never Used  . Alcohol Use: Yes     Comment: occassionally    OB History    Grav Para Term Preterm Abortions TAB SAB Ect Mult Living   4 3 2 1 1  0 0 1 0 3      Review of Systems  Constitutional: Negative for fever and diaphoresis.  Respiratory: Negative for shortness of breath.     Cardiovascular: Positive for chest pain. Negative for syncope.  Gastrointestinal: Negative for vomiting and abdominal pain.  Neurological: Negative for weakness.  Psychiatric/Behavioral: The patient is nervous/anxious.   All other systems reviewed and are negative.    Allergies  Morphine and related  Home Medications   Current Outpatient Rx  Name  Route  Sig  Dispense  Refill  . ALBUTEROL SULFATE HFA 108 (90 BASE) MCG/ACT IN AERS   Inhalation   Inhale 2 puffs into the lungs every 6 (six) hours as needed. For shortness of breath         . FERROUS SULFATE 325 (65 FE) MG PO TABS   Oral   Take 1 tablet (325 mg total) by mouth 2 (two) times daily.   60 tablet   0   . HYDROCHLOROTHIAZIDE 50 MG PO TABS   Oral   Take 1 tablet (50 mg total) by mouth daily.   60 tablet   1     BP 126/94  Temp 97.5 F (36.4 C) (Oral)  Resp 18  SpO2 100%  Breastfeeding? No  Physical Exam CONSTITUTIONAL:crying anxious HEAD AND FACE: Normocephalic/atraumatic EYES: EOMI/PERRL ENMT: Mucous membranes moist NECK: supple no meningeal signs SPINE:entire spine nontender CV: S1/S2 noted, no murmurs/rubs/gallops noted Chest - nontender, no crepitance LUNGS: Lungs are clear to  auscultation bilaterally, no apparent distress ABDOMEN: soft, nontender, no rebound or guarding GU:no cva tenderness NEURO: Pt is awake/alert, moves all extremitiesx4 EXTREMITIES: pulses normal, full ROM, no edema and no calf tenderness SKIN: warm, color normal PSYCH: anxious  ED Course  Procedures   On my intiial eval pt was talking on phone and crying.  When I reassessed she was more calm and cooperative.  When I asked if stress is worsening pain she reports "I always have stress"  She reports she felt improved.  She requested something for pain.  Otherwise her exam is unremarkable.  Her PMH lists HTN bu it is normal here and las glucose monitoring was normal.  I doubt PE/ACS/dissection at this time   MDM  Nursing  notes including past medical history and social history reviewed and considered in documentation xrays reviewed and considered        Date: 01/17/2012  Rate: 78  Rhythm: normal sinus rhythm  QRS Axis: normal  Intervals: normal  ST/T Wave abnormalities: nonspecific ST changes  Conduction Disutrbances:none  Narrative Interpretation:   Old EKG Reviewed: unchanged    Sharyon Cable, MD 01/17/12 [phone]

## 2012-02-19 ENCOUNTER — Encounter (HOSPITAL_COMMUNITY): Payer: Self-pay | Admitting: Emergency Medicine

## 2012-02-19 ENCOUNTER — Emergency Department (HOSPITAL_COMMUNITY): Payer: Self-pay

## 2012-02-19 ENCOUNTER — Emergency Department (HOSPITAL_COMMUNITY)
Admission: EM | Admit: 2012-02-19 | Discharge: 2012-02-19 | Disposition: A | Discharge status: Home Health | Payer: Self-pay | Attending: Emergency Medicine | Admitting: Emergency Medicine

## 2012-02-19 DIAGNOSIS — I720 Aneurysm of carotid artery: Secondary | ICD-10-CM | POA: Insufficient documentation

## 2012-02-19 DIAGNOSIS — J45909 Unspecified asthma, uncomplicated: Secondary | ICD-10-CM | POA: Insufficient documentation

## 2012-02-19 DIAGNOSIS — Z3202 Encounter for pregnancy test, result negative: Secondary | ICD-10-CM | POA: Insufficient documentation

## 2012-02-19 DIAGNOSIS — R110 Nausea: Secondary | ICD-10-CM | POA: Insufficient documentation

## 2012-02-19 DIAGNOSIS — R51 Headache: Secondary | ICD-10-CM | POA: Insufficient documentation

## 2012-02-19 DIAGNOSIS — Z8719 Personal history of other diseases of the digestive system: Secondary | ICD-10-CM | POA: Insufficient documentation

## 2012-02-19 DIAGNOSIS — Z8659 Personal history of other mental and behavioral disorders: Secondary | ICD-10-CM | POA: Insufficient documentation

## 2012-02-19 DIAGNOSIS — I10 Essential (primary) hypertension: Secondary | ICD-10-CM | POA: Insufficient documentation

## 2012-02-19 DIAGNOSIS — Z79899 Other long term (current) drug therapy: Secondary | ICD-10-CM | POA: Insufficient documentation

## 2012-02-19 DIAGNOSIS — E119 Type 2 diabetes mellitus without complications: Secondary | ICD-10-CM | POA: Insufficient documentation

## 2012-02-19 DIAGNOSIS — H53149 Visual discomfort, unspecified: Secondary | ICD-10-CM | POA: Insufficient documentation

## 2012-02-19 DIAGNOSIS — I671 Cerebral aneurysm, nonruptured: Secondary | ICD-10-CM | POA: Insufficient documentation

## 2012-02-19 LAB — POCT I-STAT, CHEM 8
Calcium, Ion: 1.25 mmol/L — ABNORMAL HIGH (ref 1.12–1.23)
Chloride: 109 mEq/L (ref 96–112)
Glucose, Bld: 175 mg/dL — ABNORMAL HIGH (ref 70–99)
HCT: 31 % — ABNORMAL LOW (ref 36.0–46.0)
Hemoglobin: 10.5 g/dL — ABNORMAL LOW (ref 12.0–15.0)
TCO2: 22 mmol/L (ref 0–100)

## 2012-02-19 LAB — URINALYSIS, ROUTINE W REFLEX MICROSCOPIC
Bilirubin Urine: NEGATIVE
Ketones, ur: NEGATIVE mg/dL
Nitrite: NEGATIVE
Protein, ur: NEGATIVE mg/dL
Urobilinogen, UA: 0.2 mg/dL (ref 0.0–1.0)

## 2012-02-19 LAB — PREGNANCY, URINE: Preg Test, Ur: NEGATIVE

## 2012-02-19 MED ORDER — DEXAMETHASONE SODIUM PHOSPHATE 10 MG/ML IJ SOLN
10.0000 mg | Freq: Once | INTRAMUSCULAR | Status: AC
  Administered 2012-02-19: 10 mg via INTRAVENOUS
  Filled 2012-02-19: qty 1

## 2012-02-19 MED ORDER — METOCLOPRAMIDE HCL 5 MG/ML IJ SOLN
10.0000 mg | Freq: Once | INTRAMUSCULAR | Status: AC
  Administered 2012-02-19: 10 mg via INTRAVENOUS
  Filled 2012-02-19: qty 2

## 2012-02-19 MED ORDER — DIPHENHYDRAMINE HCL 50 MG/ML IJ SOLN
25.0000 mg | Freq: Once | INTRAMUSCULAR | Status: AC
  Administered 2012-02-19: 25 mg via INTRAVENOUS
  Filled 2012-02-19: qty 1

## 2012-02-19 MED ORDER — VALPROATE SODIUM 500 MG/5ML IV SOLN
500.0000 mg | INTRAVENOUS | Status: DC
  Filled 2012-02-19: qty 5

## 2012-02-19 MED ORDER — SODIUM CHLORIDE 0.9 % IV BOLUS (SEPSIS)
1000.0000 mL | Freq: Once | INTRAVENOUS | Status: AC
  Administered 2012-02-19: 1000 mL via INTRAVENOUS

## 2012-02-19 MED ORDER — ONDANSETRON HCL 4 MG/2ML IJ SOLN
4.0000 mg | Freq: Once | INTRAMUSCULAR | Status: AC
  Administered 2012-02-19: 4 mg via INTRAVENOUS
  Filled 2012-02-19: qty 2

## 2012-02-19 MED ORDER — KETOROLAC TROMETHAMINE 30 MG/ML IJ SOLN
30.0000 mg | Freq: Once | INTRAMUSCULAR | Status: AC
  Administered 2012-02-19: 30 mg via INTRAVENOUS
  Filled 2012-02-19: qty 1

## 2012-02-19 MED ORDER — VALPROATE SODIUM 500 MG/5ML IV SOLN
500.0000 mg | Freq: Once | INTRAVENOUS | Status: AC
  Administered 2012-02-19: 500 mg via INTRAVENOUS
  Filled 2012-02-19: qty 5

## 2012-02-19 MED ORDER — IOHEXOL 350 MG/ML SOLN
50.0000 mL | Freq: Once | INTRAVENOUS | Status: AC | PRN
  Administered 2012-02-19: 50 mL via INTRAVENOUS

## 2012-02-19 NOTE — ED Provider Notes (Signed)
History     CSN: [id number]  Arrival date & time 02/19/12  1055   First MD Initiated Contact with Patient 02/19/12 1119      Chief Complaint  Patient presents with  . Migraine    (Consider location/radiation/quality/duration/timing/severity/associated sxs/prior treatment) HPI Comments: Patient presents with a one-week history of diffuse headache that waxes and wanes in intensity. She's used Tylenol without relief. She states she gets frequent headaches and this is similar. Is associated with nausea and photophobia. She is uncertain if she has migraines. The headache worsens at times and she describes a shooting pain in the right side of her neck that goes up to her head that lasts for 3 or 4 minutes at a time. She states she feels her heart beating in her head. Denies any vomiting, fever, numbness, weakness or tingling. Denies thunderclap onset. Denies any Current neck pain or stiffness. Denies any visual changes.  The history is provided by the patient.    Past Medical History  Diagnosis Date  . Asthma   . Diabetes mellitus   . Mental disorder   . Depression   . Anxiety   . Hypertension   . Hyperemesis arising during pregnancy     Past Surgical History  Procedure Date  . No past surgeries   . Tubal ligation 06/20/2011    Procedure: POST PARTUM TUBAL LIGATION;  Surgeon: Mora Bellman, MD;  Location: [facility];  Service: Gynecology;  Laterality: Bilateral;  Bilateral post partum tubal ligation    Family History  Problem Relation Age of Onset  . Heart disease Mother     CHF  . Anesthesia problems Neg Hx   . Diabetes Sister     History  Substance Use Topics  . Smoking status: Never Smoker   . Smokeless tobacco: Never Used  . Alcohol Use: Yes     Comment: occassionally    OB History    Grav Para Term Preterm Abortions TAB SAB Ect Mult Living   4 3 2 1 1  0 0 1 0 3      Review of Systems  Constitutional: Negative for fever, chills, diaphoresis, activity change and  appetite change.  HENT: Negative for congestion and rhinorrhea.   Respiratory: Negative for cough, chest tightness and shortness of breath.   Cardiovascular: Negative for chest pain.  Gastrointestinal: Negative for nausea, vomiting and abdominal pain.  Genitourinary: Negative for dysuria.  Musculoskeletal: Negative for back pain.  Neurological: Positive for headaches. Negative for seizures and weakness.  A complete 10 system review of systems was obtained and all systems are negative except as noted in the HPI and PMH.    Allergies  Morphine and related  Home Medications   Current Outpatient Rx  Name  Route  Sig  Dispense  Refill  . ALBUTEROL SULFATE HFA 108 (90 BASE) MCG/ACT IN AERS   Inhalation   Inhale 2 puffs into the lungs every 6 (six) hours as needed. For shortness of breath           BP 127/80  Pulse 71  Temp 98.4 F (36.9 C) (Oral)  Resp 18  SpO2 100%  LMP 01/25/2012  Breastfeeding? No  Physical Exam  Constitutional: She is oriented to person, place, and time. She appears well-developed and well-nourished. No distress.  HENT:  Head: Normocephalic and atraumatic.  Mouth/Throat: Oropharynx is clear and moist. No oropharyngeal exudate.  Eyes: Conjunctivae normal and EOM are normal. Pupils are equal, round, and reactive to light.  Photophobic. difficult exam but no appreciable papilledema.  Neck: Normal range of motion. Neck supple.       No meningismus  Cardiovascular: Normal rate, regular rhythm and normal heart sounds.   No murmur heard. Pulmonary/Chest: Effort normal and breath sounds normal. No respiratory distress.  Abdominal: Soft. There is no tenderness. There is no rebound and no guarding.  Musculoskeletal: Normal range of motion. She exhibits no edema and no tenderness.  Neurological: She is alert and oriented to person, place, and time. No cranial nerve deficit. She exhibits normal muscle tone. Coordination normal.       5 out of 5 strength  throughout. No ataxia finger to nose, cranial nerves 3-12 intact   Skin: Skin is warm.    ED Course  Procedures (including critical care time)  Labs Reviewed  POCT I-STAT, CHEM 8 - Abnormal; Notable for the following:    BUN 24 (*)     Creatinine, Ser 1.30 (*)     Glucose, Bld 175 (*)     Calcium, Ion 1.25 (*)     Hemoglobin 10.5 (*)     HCT 31.0 (*)     All other components within normal limits  PREGNANCY, URINE  URINALYSIS, ROUTINE W REFLEX MICROSCOPIC   Ct Angio Head W/cm &/or Wo Cm  02/19/2012  *RADIOLOGY REPORT*  Clinical Data:  Migraine headaches.  Severe pain extending into the neck.  Question dissection.  CT ANGIOGRAPHY HEAD AND NECK  Technique:  Multidetector CT imaging of the head and neck was performed using the standard protocol during bolus administration of intravenous contrast.  Multiplanar CT image reconstructions including MIPs were obtained to evaluate the vascular anatomy. Carotid stenosis measurements (when applicable) are obtained utilizing NASCET criteria, using the distal internal carotid diameter as the denominator.  Contrast: 43mL OMNIPAQUE IOHEXOL 350 MG/ML SOLN  Comparison:  CT head without contrast 08/16/2011  CTA NECK  Findings:  A standard three-vessel arch configuration is present. Both vertebral arteries originate from the subclavian arteries. There is no significant proximal stenosis.  Right vertebral artery is slightly dominant to the left along the very close in size. There is no focal stenosis or dissection.  The right common carotid artery is within normal limits. Bifurcation is unremarkable.  Minimal tortuosity is noted before the skull base.  There is no significant stenosis.  The left common carotid artery is within normal limits.  The bifurcation is normal.  The left internal carotid artery is unremarkable.  The soft tissues of the neck are within normal limits.  The lung apices are clear.  The bone windows are unremarkable.   Review of the MIP images  confirms the above findings.  IMPRESSION:  1.  Mild tortuosity of the right internal carotid artery just below the skull base. 2.  No significant stenosis or dissection.  CTA HEAD  Findings:  The noncontrast images of the brain demonstrate no acute infarct, hemorrhage, or mass lesion.  Ventricles are normal size. No significant extra-axial fluid collection is present.  The postcontrast images demonstrate no pathologic enhancement.  The left maxillary sinus is near completely opacified, a new finding.  Scattered mucosal disease is present in the anterior ethmoid air cells.  There is partial opacification of the left frontal sinus.  Mucosal thickening is present in the left sphenoid sinus as well.  Mastoid air cells are clear.  The anterior genu of the cavernous right internal carotid artery is moderately tortuous with lateral extension.  There is no focal stenosis or aneurysm.  There is focal widening of the pre cavernous left internal carotid artery with increased tortuosity as well. There is no significant stenosis or aneurysm.  There is asymmetric opacification of the left cavernous sinus.  The supraclinoid internal carotid arteries are within normal limits bilaterally.  The left A1 segment is hypoplastic, within normal limits.  The anterior communicating artery is patent.  The MCA bifurcations are within normal limits bilaterally.  The ACA and MCA branch vessels are normal.  The vertebral basilar junction is within normal limits.  The left PICA origin is visualized and normal.  The right PICA is distal. The basilar artery is within normal limits.  Both posterior cerebral arteries originate from basilar tip and are within normal limits.  The dural sinuses fill normally.   Review of the MIP images confirms the above findings.  IMPRESSION:  1.  Focal widening of the pre cavernous left internal carotid artery is compatible with either a focal fusiform aneurysm or a contained dissection. 2.  Asymmetric enhancement of  the left cavernous sinus.  Given the vessel irregularity, a carotid cavernous fistula is not excluded. 3.  Abnormal tortuosity of the cavernous right internal carotid artery without a focal stenosis or ectasia. 4.  The remainder of the intracranial CTA is within normal limits.  These results were called by telephone on 02/19/2011 at 03:45 p.m. to Dr. Wyvonnia Dusky, who verbally acknowledged these results.   Original Report Authenticated By: San Morelle, M.D.    Ct Angio Neck W/cm &/or Wo/cm  02/19/2012  *RADIOLOGY REPORT*  Clinical Data:  Migraine headaches.  Severe pain extending into the neck.  Question dissection.  CT ANGIOGRAPHY HEAD AND NECK  Technique:  Multidetector CT imaging of the head and neck was performed using the standard protocol during bolus administration of intravenous contrast.  Multiplanar CT image reconstructions including MIPs were obtained to evaluate the vascular anatomy. Carotid stenosis measurements (when applicable) are obtained utilizing NASCET criteria, using the distal internal carotid diameter as the denominator.  Contrast: 38mL OMNIPAQUE IOHEXOL 350 MG/ML SOLN  Comparison:  CT head without contrast 08/16/2011  CTA NECK  Findings:  A standard three-vessel arch configuration is present. Both vertebral arteries originate from the subclavian arteries. There is no significant proximal stenosis.  Right vertebral artery is slightly dominant to the left along the very close in size. There is no focal stenosis or dissection.  The right common carotid artery is within normal limits. Bifurcation is unremarkable.  Minimal tortuosity is noted before the skull base.  There is no significant stenosis.  The left common carotid artery is within normal limits.  The bifurcation is normal.  The left internal carotid artery is unremarkable.  The soft tissues of the neck are within normal limits.  The lung apices are clear.  The bone windows are unremarkable.   Review of the MIP images confirms the  above findings.  IMPRESSION:  1.  Mild tortuosity of the right internal carotid artery just below the skull base. 2.  No significant stenosis or dissection.  CTA HEAD  Findings:  The noncontrast images of the brain demonstrate no acute infarct, hemorrhage, or mass lesion.  Ventricles are normal size. No significant extra-axial fluid collection is present.  The postcontrast images demonstrate no pathologic enhancement.  The left maxillary sinus is near completely opacified, a new finding.  Scattered mucosal disease is present in the anterior ethmoid air cells.  There is partial opacification of the left frontal sinus.  Mucosal thickening is present in the left  sphenoid sinus as well.  Mastoid air cells are clear.  The anterior genu of the cavernous right internal carotid artery is moderately tortuous with lateral extension.  There is no focal stenosis or aneurysm.  There is focal widening of the pre cavernous left internal carotid artery with increased tortuosity as well. There is no significant stenosis or aneurysm.  There is asymmetric opacification of the left cavernous sinus.  The supraclinoid internal carotid arteries are within normal limits bilaterally.  The left A1 segment is hypoplastic, within normal limits.  The anterior communicating artery is patent.  The MCA bifurcations are within normal limits bilaterally.  The ACA and MCA branch vessels are normal.  The vertebral basilar junction is within normal limits.  The left PICA origin is visualized and normal.  The right PICA is distal. The basilar artery is within normal limits.  Both posterior cerebral arteries originate from basilar tip and are within normal limits.  The dural sinuses fill normally.   Review of the MIP images confirms the above findings.  IMPRESSION:  1.  Focal widening of the pre cavernous left internal carotid artery is compatible with either a focal fusiform aneurysm or a contained dissection. 2.  Asymmetric enhancement of the left  cavernous sinus.  Given the vessel irregularity, a carotid cavernous fistula is not excluded. 3.  Abnormal tortuosity of the cavernous right internal carotid artery without a focal stenosis or ectasia. 4.  The remainder of the intracranial CTA is within normal limits.  These results were called by telephone on 02/19/2011 at 03:45 p.m. to Dr. Wyvonnia Dusky, who verbally acknowledged these results.   Original Report Authenticated By: San Morelle, M.D.      No diagnosis found.    MDM  One-week history of headache that varies in intensity it is associated with nausea photophobia. Vital stable, no distress, nonfocal neuro exam History and exam not consistent with SAH or meningitis.  Given shooting pain in neck, consider vascular pathology such as dissection. Hemoglobin stable.   CTA findings discussed with Dr. Jobe Igo. Concern for possible carotid cavernous fistula. As well as pre-cavernous left ICA fusiform aneurysm or contained dissection. Patient's headache is on the right side of her head. Her neuro exam remains nonfocal. Findings discussed with Dr. Ellene Route neurosurgery reviewed the images and doubts true carotid cavernous fistula. He states as patient is asymptomatic does not think she needs any intervention at this point. Dr. Armida Sans of neurology agrees. Recommend starting aspirin.  CTA findings d/w Dr. Estanislado Pandy.  He plans to perform angiogram at 8am tomorrow.  Patient will be NPO after midnight.  Patient's neurological exam remains nonfocal at time of discharge.    Angiogram changed to 10 am Monday as IR does not have staff tomorrow to recover patient.  Patient and Dr. Estanislado Pandy aware.  CRITICAL CARE Performed by: Ezequiel Essex   Total critical care time: 30  Critical care time was exclusive of separately billable procedures and treating other patients.  Critical care was necessary to treat or prevent imminent or life-threatening deterioration.  Critical care was time spent  personally by me on the following activities: development of treatment plan with patient and/or surrogate as well as nursing, discussions with consultants, evaluation of patient's response to treatment, examination of patient, obtaining history from patient or surrogate, ordering and performing treatments and interventions, ordering and review of laboratory studies, ordering and review of radiographic studies, pulse oximetry and re-evaluation of patient's condition.   Ezequiel Essex, MD 02/19/12 1816

## 2012-02-19 NOTE — ED Notes (Signed)
Pt discharged to home with family. Pt given special instructions related to angiogram for Monday morning. Pt voiced understanding. NAD.

## 2012-02-19 NOTE — ED Notes (Signed)
Pt. Stated, i've had a HA for a week and A pain on my side. Extra strength tylenol but doesn't work

## 2012-02-19 NOTE — ED Notes (Signed)
Regular Diet ordered

## 2012-02-19 NOTE — ED Notes (Signed)
Meal tray given to patient.

## 2012-02-21 ENCOUNTER — Other Ambulatory Visit: Payer: Self-pay | Admitting: Radiology

## 2012-02-21 ENCOUNTER — Ambulatory Visit (HOSPITAL_COMMUNITY)
Admit: 2012-02-21 | Discharge: 2012-02-21 | Disposition: A | Discharge status: Home / Self Care | Payer: Medicaid Other | Source: Ambulatory Visit | Attending: Interventional Radiology | Admitting: Interventional Radiology

## 2012-02-21 ENCOUNTER — Other Ambulatory Visit (HOSPITAL_COMMUNITY): Payer: Self-pay | Admitting: Interventional Radiology

## 2012-02-21 VITALS — BP 128/88 | HR 88 | Temp 97.6°F | Resp 12 | Ht 67.0 in | Wt 189.0 lb

## 2012-02-21 DIAGNOSIS — H53149 Visual discomfort, unspecified: Secondary | ICD-10-CM | POA: Insufficient documentation

## 2012-02-21 DIAGNOSIS — I7771 Dissection of carotid artery: Secondary | ICD-10-CM

## 2012-02-21 DIAGNOSIS — H538 Other visual disturbances: Secondary | ICD-10-CM | POA: Insufficient documentation

## 2012-02-21 DIAGNOSIS — R51 Headache: Secondary | ICD-10-CM | POA: Insufficient documentation

## 2012-02-21 DIAGNOSIS — I10 Essential (primary) hypertension: Secondary | ICD-10-CM | POA: Insufficient documentation

## 2012-02-21 DIAGNOSIS — E119 Type 2 diabetes mellitus without complications: Secondary | ICD-10-CM | POA: Insufficient documentation

## 2012-02-21 LAB — CBC WITH DIFFERENTIAL/PLATELET
Basophils Relative: 1 % (ref 0–1)
HCT: 28.2 % — ABNORMAL LOW (ref 36.0–46.0)
Hemoglobin: 8.9 g/dL — ABNORMAL LOW (ref 12.0–15.0)
Lymphocytes Relative: 44 % (ref 12–46)
Lymphs Abs: 3.3 10*3/uL (ref 0.7–4.0)
Monocytes Relative: 10 % (ref 3–12)
Neutro Abs: 3.2 10*3/uL (ref 1.7–7.7)
Neutrophils Relative %: 43 % (ref 43–77)
RBC: 3.85 MIL/uL — ABNORMAL LOW (ref 3.87–5.11)
WBC: 7.5 10*3/uL (ref 4.0–10.5)

## 2012-02-21 LAB — GLUCOSE, CAPILLARY: Glucose-Capillary: 100 mg/dL — ABNORMAL HIGH (ref 70–99)

## 2012-02-21 LAB — BASIC METABOLIC PANEL
Chloride: 109 mEq/L (ref 96–112)
GFR calc Af Amer: 67 mL/min — ABNORMAL LOW (ref >90)
GFR calc non Af Amer: 58 mL/min — ABNORMAL LOW (ref >90)
Potassium: 4.9 mEq/L (ref 3.5–5.1)
Sodium: 139 mEq/L (ref 135–145)

## 2012-02-21 LAB — PROTIME-INR: INR: 0.9 (ref 0.00–1.49)

## 2012-02-21 MED ORDER — SODIUM CHLORIDE 0.9 % IV SOLN: INTRAVENOUS | Status: AC

## 2012-02-21 MED ORDER — IOHEXOL 300 MG/ML  SOLN
150.0000 mL | Freq: Once | INTRAMUSCULAR | Status: AC | PRN
  Administered 2012-02-21: 100 mL via INTRA_ARTERIAL

## 2012-02-21 MED ORDER — MIDAZOLAM HCL 2 MG/2ML IJ SOLN
INTRAMUSCULAR | Status: AC
  Filled 2012-02-21: qty 4

## 2012-02-21 MED ORDER — ACETAMINOPHEN 500 MG PO TABS
1000.0000 mg | ORAL_TABLET | Freq: Once | ORAL | Status: AC
  Administered 2012-02-21: 1000 mg via ORAL

## 2012-02-21 MED ORDER — FENTANYL CITRATE 0.05 MG/ML IJ SOLN
INTRAMUSCULAR | Status: DC | PRN
  Administered 2012-02-21 (×2): 25 ug via INTRAVENOUS

## 2012-02-21 MED ORDER — FENTANYL CITRATE 0.05 MG/ML IJ SOLN
INTRAMUSCULAR | Status: AC
  Filled 2012-02-21: qty 4

## 2012-02-21 MED ORDER — SODIUM CHLORIDE 0.9 % IV SOLN: INTRAVENOUS | Status: DC

## 2012-02-21 MED ORDER — MIDAZOLAM HCL 2 MG/2ML IJ SOLN
INTRAMUSCULAR | Status: DC | PRN
  Administered 2012-02-21 (×2): 1 mg via INTRAVENOUS

## 2012-02-21 MED ORDER — ACETAMINOPHEN 500 MG PO TABS
ORAL_TABLET | ORAL | Status: AC
  Filled 2012-02-21: qty 2

## 2012-02-21 MED ORDER — HEPARIN SOD (PORK) LOCK FLUSH 100 UNIT/ML IV SOLN
INTRAVENOUS | Status: DC | PRN
  Administered 2012-02-21: 500 [IU] via INTRAVENOUS

## 2012-02-21 NOTE — Progress Notes (Signed)
UP AND WALKED AND TOL WELL; RIGHT GROIN STABLE; NO BLEEDING OR HEMATOMA 

## 2012-02-21 NOTE — Progress Notes (Signed)
CLIENT STATES NEED PAIN MED FOR HEADACHE AND KEVIN ALLRED,PA NOTIFIED AND CLIENT STATES DOES NOT WANT TO TAKE TYLENOL FOR HEADACHE

## 2012-02-21 NOTE — Procedures (Signed)
S/P 4 vessel cerebral arterioogram. RT CFA approach  Preliminaryfindings  1. Tortuous and ectatic lt ICA cavernous segment.

## 2012-02-21 NOTE — H&P (Signed)
[name] is an 32 y.o. female.   Chief Complaint: headaches HPI: Patient with 3 month history of right frontal headaches associated with photophobia and occasional visual blurriness. Recent CTA head and neck revealed focal widening of precavernous left internal carotid artery suspicious for a focal fusiform aneurysm or contained dissection and asymmetric enhancement of the left cavernous sinus. She presents today for diagnostic cerebral arteriogram for further assessment.  Past Medical History  Diagnosis Date  . Asthma   . Diabetes mellitus   . Mental disorder   . Depression   . Anxiety   . Hypertension   . Hyperemesis arising during pregnancy     Past Surgical History  Procedure Date  . No past surgeries   . Tubal ligation 06/20/2011    Procedure: POST PARTUM TUBAL LIGATION;  Surgeon: Mora Bellman, MD;  Location: [facility];  Service: Gynecology;  Laterality: Bilateral;  Bilateral post partum tubal ligation    Family History  Problem Relation Age of Onset  . Heart disease Mother     CHF  . Anesthesia problems Neg Hx   . Diabetes Sister    Social History:  reports that she has never smoked. She has never used smokeless tobacco. She reports that she drinks alcohol. She reports that she does not use illicit drugs.  Allergies:  Allergies  Allergen Reactions  . Morphine And Related Nausea And Vomiting    Current outpatient prescriptions:albuterol (PROVENTIL HFA;VENTOLIN HFA) 108 (90 BASE) MCG/ACT inhaler, Inhale 2 puffs into the lungs every 6 (six) hours as needed. For shortness of breath, Disp: , Rfl: ;  [DISCONTINUED] escitalopram (LEXAPRO) 20 MG tablet, Take 1 tablet (20 mg total) by mouth daily., Disp: 30 tablet, Rfl: 3 Current facility-administered medications:0.9 %  sodium chloride infusion, , Intravenous, Continuous, Lavonia Drafts, PA   Results for orders placed during the hospital encounter of 02/21/12 (from the past 48 hour(s))  APTT     Status: Normal   Collection  Time   02/21/12  9:07 AM      Component Value Range Comment   aPTT 29  24 - 37 seconds   BASIC METABOLIC PANEL     Status: Abnormal   Collection Time   02/21/12  9:07 AM      Component Value Range Comment   Sodium 139  135 - 145 mEq/L    Potassium 4.9  3.5 - 5.1 mEq/L    Chloride 109  96 - 112 mEq/L    CO2 21  19 - 32 mEq/L    Glucose, Bld 106 (*) 70 - 99 mg/dL    BUN 23  6 - 23 mg/dL    Creatinine, Ser 1.21 (*) 0.50 - 1.10 mg/dL    Calcium 9.1  8.4 - 10.5 mg/dL    GFR calc non Af Amer 58 (*) >90 mL/min    GFR calc Af Amer 67 (*) >90 mL/min   CBC WITH DIFFERENTIAL     Status: Abnormal   Collection Time   02/21/12  9:07 AM      Component Value Range Comment   WBC 7.5  4.0 - 10.5 K/uL    RBC 3.85 (*) 3.87 - 5.11 MIL/uL    Hemoglobin 8.9 (*) 12.0 - 15.0 g/dL    HCT 28.2 (*) 36.0 - 46.0 %    MCV 73.2 (*) 78.0 - 100.0 fL    MCH 23.1 (*) 26.0 - 34.0 pg    MCHC 31.6  30.0 - 36.0 g/dL  RDW 15.6 (*) 11.5 - 15.5 %    Platelets 209  150 - 400 K/uL    Neutrophils Relative 43  43 - 77 %    Neutro Abs 3.2  1.7 - 7.7 K/uL    Lymphocytes Relative 44  12 - 46 %    Lymphs Abs 3.3  0.7 - 4.0 K/uL    Monocytes Relative 10  3 - 12 %    Monocytes Absolute 0.7  0.1 - 1.0 K/uL    Eosinophils Relative 2  0 - 5 %    Eosinophils Absolute 0.2  0.0 - 0.7 K/uL    Basophils Relative 1  0 - 1 %    Basophils Absolute 0.1  0.0 - 0.1 K/uL   PROTIME-INR     Status: Normal   Collection Time   02/21/12  9:07 AM      Component Value Range Comment   Prothrombin Time 12.1  11.6 - 15.2 seconds    INR 0.90  0.00 - 1.49   GLUCOSE, CAPILLARY     Status: Abnormal   Collection Time   02/21/12  9:34 AM      Component Value Range Comment   Glucose-Capillary 100 (*) 70 - 99 mg/dL    Ct Angio Head W/cm &/or Wo Cm  02/19/2012  *RADIOLOGY REPORT*  Clinical Data:  Migraine headaches.  Severe pain extending into the neck.  Question dissection.  CT ANGIOGRAPHY HEAD AND NECK  Technique:  Multidetector CT imaging of the head and  neck was performed using the standard protocol during bolus administration of intravenous contrast.  Multiplanar CT image reconstructions including MIPs were obtained to evaluate the vascular anatomy. Carotid stenosis measurements (when applicable) are obtained utilizing NASCET criteria, using the distal internal carotid diameter as the denominator.  Contrast: 107mL OMNIPAQUE IOHEXOL 350 MG/ML SOLN  Comparison:  CT head without contrast 08/16/2011  CTA NECK  Findings:  A standard three-vessel arch configuration is present. Both vertebral arteries originate from the subclavian arteries. There is no significant proximal stenosis.  Right vertebral artery is slightly dominant to the left along the very close in size. There is no focal stenosis or dissection.  The right common carotid artery is within normal limits. Bifurcation is unremarkable.  Minimal tortuosity is noted before the skull base.  There is no significant stenosis.  The left common carotid artery is within normal limits.  The bifurcation is normal.  The left internal carotid artery is unremarkable.  The soft tissues of the neck are within normal limits.  The lung apices are clear.  The bone windows are unremarkable.   Review of the MIP images confirms the above findings.  IMPRESSION:  1.  Mild tortuosity of the right internal carotid artery just below the skull base. 2.  No significant stenosis or dissection.  CTA HEAD  Findings:  The noncontrast images of the brain demonstrate no acute infarct, hemorrhage, or mass lesion.  Ventricles are normal size. No significant extra-axial fluid collection is present.  The postcontrast images demonstrate no pathologic enhancement.  The left maxillary sinus is near completely opacified, a new finding.  Scattered mucosal disease is present in the anterior ethmoid air cells.  There is partial opacification of the left frontal sinus.  Mucosal thickening is present in the left sphenoid sinus as well.  Mastoid air cells are  clear.  The anterior genu of the cavernous right internal carotid artery is moderately tortuous with lateral extension.  There is no focal stenosis or aneurysm.  There is focal widening of the pre cavernous left internal carotid artery with increased tortuosity as well. There is no significant stenosis or aneurysm.  There is asymmetric opacification of the left cavernous sinus.  The supraclinoid internal carotid arteries are within normal limits bilaterally.  The left A1 segment is hypoplastic, within normal limits.  The anterior communicating artery is patent.  The MCA bifurcations are within normal limits bilaterally.  The ACA and MCA branch vessels are normal.  The vertebral basilar junction is within normal limits.  The left PICA origin is visualized and normal.  The right PICA is distal. The basilar artery is within normal limits.  Both posterior cerebral arteries originate from basilar tip and are within normal limits.  The dural sinuses fill normally.   Review of the MIP images confirms the above findings.  IMPRESSION:  1.  Focal widening of the pre cavernous left internal carotid artery is compatible with either a focal fusiform aneurysm or a contained dissection. 2.  Asymmetric enhancement of the left cavernous sinus.  Given the vessel irregularity, a carotid cavernous fistula is not excluded. 3.  Abnormal tortuosity of the cavernous right internal carotid artery without a focal stenosis or ectasia. 4.  The remainder of the intracranial CTA is within normal limits.  These results were called by telephone on 02/19/2011 at 03:45 p.m. to Dr. Wyvonnia Dusky, who verbally acknowledged these results.   Original Report Authenticated By: San Morelle, M.D.    Ct Angio Neck W/cm &/or Wo/cm  02/19/2012  *RADIOLOGY REPORT*  Clinical Data:  Migraine headaches.  Severe pain extending into the neck.  Question dissection.  CT ANGIOGRAPHY HEAD AND NECK  Technique:  Multidetector CT imaging of the head and neck was  performed using the standard protocol during bolus administration of intravenous contrast.  Multiplanar CT image reconstructions including MIPs were obtained to evaluate the vascular anatomy. Carotid stenosis measurements (when applicable) are obtained utilizing NASCET criteria, using the distal internal carotid diameter as the denominator.  Contrast: 42mL OMNIPAQUE IOHEXOL 350 MG/ML SOLN  Comparison:  CT head without contrast 08/16/2011  CTA NECK  Findings:  A standard three-vessel arch configuration is present. Both vertebral arteries originate from the subclavian arteries. There is no significant proximal stenosis.  Right vertebral artery is slightly dominant to the left along the very close in size. There is no focal stenosis or dissection.  The right common carotid artery is within normal limits. Bifurcation is unremarkable.  Minimal tortuosity is noted before the skull base.  There is no significant stenosis.  The left common carotid artery is within normal limits.  The bifurcation is normal.  The left internal carotid artery is unremarkable.  The soft tissues of the neck are within normal limits.  The lung apices are clear.  The bone windows are unremarkable.   Review of the MIP images confirms the above findings.  IMPRESSION:  1.  Mild tortuosity of the right internal carotid artery just below the skull base. 2.  No significant stenosis or dissection.  CTA HEAD  Findings:  The noncontrast images of the brain demonstrate no acute infarct, hemorrhage, or mass lesion.  Ventricles are normal size. No significant extra-axial fluid collection is present.  The postcontrast images demonstrate no pathologic enhancement.  The left maxillary sinus is near completely opacified, a new finding.  Scattered mucosal disease is present in the anterior ethmoid air cells.  There is partial opacification of the left frontal sinus.  Mucosal thickening is present in the left sphenoid  sinus as well.  Mastoid air cells are clear.   The anterior genu of the cavernous right internal carotid artery is moderately tortuous with lateral extension.  There is no focal stenosis or aneurysm.  There is focal widening of the pre cavernous left internal carotid artery with increased tortuosity as well. There is no significant stenosis or aneurysm.  There is asymmetric opacification of the left cavernous sinus.  The supraclinoid internal carotid arteries are within normal limits bilaterally.  The left A1 segment is hypoplastic, within normal limits.  The anterior communicating artery is patent.  The MCA bifurcations are within normal limits bilaterally.  The ACA and MCA branch vessels are normal.  The vertebral basilar junction is within normal limits.  The left PICA origin is visualized and normal.  The right PICA is distal. The basilar artery is within normal limits.  Both posterior cerebral arteries originate from basilar tip and are within normal limits.  The dural sinuses fill normally.   Review of the MIP images confirms the above findings.  IMPRESSION:  1.  Focal widening of the pre cavernous left internal carotid artery is compatible with either a focal fusiform aneurysm or a contained dissection. 2.  Asymmetric enhancement of the left cavernous sinus.  Given the vessel irregularity, a carotid cavernous fistula is not excluded. 3.  Abnormal tortuosity of the cavernous right internal carotid artery without a focal stenosis or ectasia. 4.  The remainder of the intracranial CTA is within normal limits.  These results were called by telephone on 02/19/2011 at 03:45 p.m. to Dr. Wyvonnia Dusky, who verbally acknowledged these results.   Original Report Authenticated By: San Morelle, M.D.     Review of Systems  Constitutional: Negative for fever and chills.  Eyes: Positive for blurred vision and photophobia. Negative for double vision.  Respiratory: Negative for cough and shortness of breath.   Cardiovascular: Negative for chest pain.   Gastrointestinal: Negative for nausea, vomiting and abdominal pain.  Musculoskeletal: Negative for back pain.  Neurological: Positive for headaches. Negative for dizziness, sensory change, speech change, focal weakness, seizures and loss of consciousness.  Endo/Heme/Allergies: Does not bruise/bleed easily.  Psychiatric/Behavioral: Positive for depression.    Blood pressure 133/74, pulse 65, temperature 97.9 F (36.6 C), temperature source Oral, resp. rate 18, height 5\' 7"  (1.702 m), weight 189 lb (85.73 kg), last menstrual period 01/25/2012, SpO2 100.00%. Physical Exam  Constitutional: She is oriented to person, place, and time. She appears well-developed and well-nourished.  Cardiovascular: Normal rate and regular rhythm.   Respiratory: Effort normal and breath sounds normal.  GI: Soft. Bowel sounds are normal. There is no tenderness.  Musculoskeletal: Normal range of motion. She exhibits no edema.  Neurological: She is alert and oriented to person, place, and time. No cranial nerve deficit. Coordination normal.  Skin: Skin is warm and dry.     Assessment/Plan Pt with hx rt frontal headaches with photophobia, occ visual blurriness and recent CTA head and neck revealing focal widening of precavernous left ICA and asymmetric enhancement of the left cavernous sinus. Plan is for diagnostic cerebral arteriogram to assess for cerebrovascular disease (aneurysms, dissection, or carotid cavernous fistula). Details/risks of procedure d/w pt/fiance with their understanding and consent.  Steffan Caniglia,D KEVIN 02/21/2012, 12:21 PM

## 2012-03-22 ENCOUNTER — Emergency Department (HOSPITAL_COMMUNITY): Payer: Self-pay

## 2012-03-22 ENCOUNTER — Emergency Department (HOSPITAL_COMMUNITY)
Admission: EM | Admit: 2012-03-22 | Discharge: 2012-03-23 | Disposition: A | Discharge status: Home / Self Care | Payer: Self-pay | Attending: Emergency Medicine | Admitting: Emergency Medicine

## 2012-03-22 ENCOUNTER — Encounter (HOSPITAL_COMMUNITY): Payer: Self-pay

## 2012-03-22 DIAGNOSIS — R112 Nausea with vomiting, unspecified: Secondary | ICD-10-CM | POA: Insufficient documentation

## 2012-03-22 DIAGNOSIS — Z79899 Other long term (current) drug therapy: Secondary | ICD-10-CM | POA: Insufficient documentation

## 2012-03-22 DIAGNOSIS — I10 Essential (primary) hypertension: Secondary | ICD-10-CM | POA: Insufficient documentation

## 2012-03-22 DIAGNOSIS — E119 Type 2 diabetes mellitus without complications: Secondary | ICD-10-CM | POA: Insufficient documentation

## 2012-03-22 DIAGNOSIS — J45901 Unspecified asthma with (acute) exacerbation: Secondary | ICD-10-CM | POA: Insufficient documentation

## 2012-03-22 DIAGNOSIS — Z8659 Personal history of other mental and behavioral disorders: Secondary | ICD-10-CM | POA: Insufficient documentation

## 2012-03-22 DIAGNOSIS — R51 Headache: Secondary | ICD-10-CM | POA: Insufficient documentation

## 2012-03-22 MED ORDER — METOCLOPRAMIDE HCL 5 MG/ML IJ SOLN
10.0000 mg | Freq: Once | INTRAMUSCULAR | Status: AC
  Administered 2012-03-23: 10 mg via INTRAVENOUS
  Filled 2012-03-22: qty 2

## 2012-03-22 MED ORDER — DIPHENHYDRAMINE HCL 50 MG/ML IJ SOLN
25.0000 mg | Freq: Once | INTRAMUSCULAR | Status: AC
  Administered 2012-03-23: 25 mg via INTRAVENOUS
  Filled 2012-03-22: qty 1

## 2012-03-22 MED ORDER — KETOROLAC TROMETHAMINE 30 MG/ML IJ SOLN
30.0000 mg | Freq: Once | INTRAMUSCULAR | Status: AC
  Administered 2012-03-23: 30 mg via INTRAVENOUS
  Filled 2012-03-22: qty 1

## 2012-03-22 MED ORDER — SODIUM CHLORIDE 0.9 % IV BOLUS (SEPSIS)
1000.0000 mL | Freq: Once | INTRAVENOUS | Status: AC
  Administered 2012-03-23: 1000 mL via INTRAVENOUS

## 2012-03-22 NOTE — ED Notes (Signed)
EMS- c/o Shortness of breath x 30 mins.   States she was eating shrimp became short of breath and vomited.  States it could have been panic attack.  Had an episode of the same last night.  Was given 50mg  benadryl po pta.  No distress noted now.

## 2012-03-22 NOTE — ED Notes (Signed)
Patient transported to X-ray 

## 2012-03-23 NOTE — ED Provider Notes (Signed)
History     CSN: [id number]  Arrival date & time 03/22/12  2145   First MD Initiated Contact with Patient 03/22/12 2224      Chief Complaint  Patient presents with  . Shortness of Breath  . Allergic Reaction    (Consider location/radiation/quality/duration/timing/severity/associated sxs/prior treatment) HPI Comments: 32 y.o. Pt complaining of shortness of breath and vomiting after eating shrimp at home. Pt states she has eating shrimp and shellfish before with no allergic reaction. Airway is patent. Pt talking in complete sentences.  Pt also stated some mild chest pain that has since resolved.   Pt states after vomiting episode, she rocked herself on the couch while family members called EMS and tried to soothe her. Pt states she has hx of anxiety since her 29 mo old son was born and "should get back on that medication," though she could not recall the name.   Pt is now complaining of headache. 10/10. States this is similar to headaches she has had in the past. No photophobia. No thunderclap onset. Denies fever, neck stiffness, numbness, tingling, weakness.   PMHx significant for asthma, but it has been many years since an exacerbation. Pt states recent episode is not similar. Pt states last night having coughing fit that resolved as she rocked herself to sleep. Woke up asymptomatic.      Patient is a 32 y.o. female presenting with shortness of breath and allergic reaction.  Shortness of Breath Associated symptoms: headaches and vomiting   Associated symptoms: no abdominal pain, no chest pain, no diaphoresis, no fever, no neck pain and no rash   Allergic Reaction The primary symptoms are  shortness of breath, nausea and vomiting. The primary symptoms do not include abdominal pain, diarrhea, dizziness, palpitations or rash.    Past Medical History  Diagnosis Date  . Asthma   . Diabetes mellitus   . Mental disorder   . Depression   . Anxiety   . Hypertension   . Hyperemesis  arising during pregnancy     Past Surgical History  Procedure Laterality Date  . No past surgeries    . Tubal ligation  06/20/2011    Procedure: POST PARTUM TUBAL LIGATION;  Surgeon: Mora Bellman, MD;  Location: [facility];  Service: Gynecology;  Laterality: Bilateral;  Bilateral post partum tubal ligation    Family History  Problem Relation Age of Onset  . Heart disease Mother     CHF  . Anesthesia problems Neg Hx   . Diabetes Sister     History  Substance Use Topics  . Smoking status: Never Smoker   . Smokeless tobacco: Never Used  . Alcohol Use: Yes     Comment: occassionally    OB History   Grav Para Term Preterm Abortions TAB SAB Ect Mult Living   4 3 2 1 1  0 0 1 0 3      Review of Systems  Constitutional: Negative for fever and diaphoresis.  HENT: Negative for neck pain and neck stiffness.        No jaw pain. No scalp tenderness  Eyes: Negative for photophobia and visual disturbance.  Respiratory: Positive for shortness of breath. Negative for apnea and chest tightness.   Cardiovascular: Negative for chest pain and palpitations.  Gastrointestinal: Positive for nausea and vomiting. Negative for abdominal pain, diarrhea and constipation.  Genitourinary: Negative for dysuria.  Musculoskeletal: Negative for gait problem.  Skin: Negative for rash.  Neurological: Positive for headaches. Negative for dizziness, weakness, light-headedness and  numbness.       Frontal headache    Allergies  Morphine and related  Home Medications   Current Outpatient Rx  Name  Route  Sig  Dispense  Refill  . albuterol (PROVENTIL HFA;VENTOLIN HFA) 108 (90 BASE) MCG/ACT inhaler   Inhalation   Inhale 2 puffs into the lungs every 6 (six) hours as needed. For shortness of breath           BP 132/92  Pulse 75  Temp(Src) 97.7 F (36.5 C) (Oral)  Resp 22  SpO2 100%  LMP 03/08/2012  Physical Exam  Nursing note and vitals reviewed. Constitutional: She is oriented to person, place,  and time. She appears well-developed and well-nourished. No distress.  HENT:  Head: Normocephalic and atraumatic.  Eyes: Conjunctivae and EOM are normal. Pupils are equal, round, and reactive to light.  Neck: Normal range of motion. Neck supple.  No meningeal signs  Cardiovascular: Normal rate, regular rhythm and normal heart sounds.  Exam reveals no gallop and no friction rub.   No murmur heard. Pulmonary/Chest: Effort normal and breath sounds normal. No respiratory distress. She has no wheezes. She has no rales. She exhibits no tenderness.  Abdominal: Soft. Bowel sounds are normal. She exhibits no distension. There is no tenderness. There is no rebound and no guarding.  Musculoskeletal: Normal range of motion. She exhibits no edema and no tenderness.  5/5 strength throughout.   Neurological: She is alert and oriented to person, place, and time. No cranial nerve deficit.  No focal deficits. Sensation to light touch intact.  Skin: Skin is warm and dry. She is not diaphoretic. No erythema.  Psychiatric:  Anxious.    ED Course  Procedures (including critical care time)  Labs Reviewed - No data to display Dg Chest 2 View  03/22/2012  *RADIOLOGY REPORT*  Clinical Data: Chest pain, short of breath  CHEST - 2 VIEW  Comparison: None.  Findings: Normal mediastinum and cardiac silhouette.  Normal pulmonary  vasculature.  No evidence of effusion, infiltrate, or pneumothorax.  No acute bony abnormality.  IMPRESSION: Normal chest radiograph   Original Report Authenticated By: Suzy Bouchard, M.D.     Date: 03/23/2012  Rate: 84  Rhythm: normal sinus rhythm  QRS Axis: normal  Intervals: normal  ST/T Wave abnormalities: normal  Conduction Disutrbances: none  Narrative Interpretation: Normal EKG  Old EKG Reviewed: None available   Diagnosis: headache.   MDM  Sx had resolved upon arrival at ED. Pt main complaint was that of a HA. Presents as typical HA and non concerning for [facility], ICH,  Meningitis, or temporal arteritis. Pt is afebrile with no focal neuro deficits, nuchal rigidity, or change in vision. Pain was 10/10 in the ED, reduced to 6/10 after administration of headache cocktail. Pt is to follow up with PCP to discuss prophylactic medication. Pt verbalizes understanding and is agreeable with plan to dc. Pt seen by Dr. Marnette Burgess as well who agrees with the plan.    Coralee North, PA-C 03/23/12 0120

## 2012-03-23 NOTE — ED Notes (Signed)
Pt dc to home.  Pt states feeling much better now.  Pt ambulatory to exit without difficulty.  Pt denies need for w/c.

## 2012-03-23 NOTE — ED Provider Notes (Signed)
Medical screening examination/treatment/procedure(s) were performed by non-physician practitioner and as supervising physician I was immediately available for consultation/collaboration.  Teressa Lower, MD 03/23/12 0730

## 2012-04-05 ENCOUNTER — Emergency Department (HOSPITAL_COMMUNITY)
Admission: EM | Admit: 2012-04-05 | Discharge: 2012-04-05 | Disposition: A | Discharge status: Home / Self Care | Payer: Self-pay | Attending: Emergency Medicine | Admitting: Emergency Medicine

## 2012-04-05 ENCOUNTER — Encounter (HOSPITAL_COMMUNITY): Payer: Self-pay | Admitting: *Deleted

## 2012-04-05 DIAGNOSIS — Z79899 Other long term (current) drug therapy: Secondary | ICD-10-CM | POA: Insufficient documentation

## 2012-04-05 DIAGNOSIS — E119 Type 2 diabetes mellitus without complications: Secondary | ICD-10-CM | POA: Insufficient documentation

## 2012-04-05 DIAGNOSIS — I10 Essential (primary) hypertension: Secondary | ICD-10-CM | POA: Insufficient documentation

## 2012-04-05 DIAGNOSIS — J45909 Unspecified asthma, uncomplicated: Secondary | ICD-10-CM | POA: Insufficient documentation

## 2012-04-05 DIAGNOSIS — R51 Headache: Secondary | ICD-10-CM

## 2012-04-05 DIAGNOSIS — R42 Dizziness and giddiness: Secondary | ICD-10-CM | POA: Insufficient documentation

## 2012-04-05 DIAGNOSIS — Z8659 Personal history of other mental and behavioral disorders: Secondary | ICD-10-CM | POA: Insufficient documentation

## 2012-04-05 LAB — CBC WITH DIFFERENTIAL/PLATELET
Basophils Absolute: 0 10*3/uL (ref 0.0–0.1)
Basophils Relative: 0 % (ref 0–1)
Eosinophils Absolute: 0.1 10*3/uL (ref 0.0–0.7)
Eosinophils Relative: 1 % (ref 0–5)
HCT: 27.3 % — ABNORMAL LOW (ref 36.0–46.0)
MCH: 23.5 pg — ABNORMAL LOW (ref 26.0–34.0)
MCHC: 32.2 g/dL (ref 30.0–36.0)
MCV: 72.8 fL — ABNORMAL LOW (ref 78.0–100.0)
Monocytes Absolute: 0.5 10*3/uL (ref 0.1–1.0)
Platelets: 253 10*3/uL (ref 150–400)
RDW: 15.4 % (ref 11.5–15.5)

## 2012-04-05 LAB — POCT I-STAT, CHEM 8
BUN: 21 mg/dL (ref 6–23)
Calcium, Ion: 1.16 mmol/L (ref 1.12–1.23)
Hemoglobin: 9.5 g/dL — ABNORMAL LOW (ref 12.0–15.0)
Sodium: 138 mEq/L (ref 135–145)
TCO2: 23 mmol/L (ref 0–100)

## 2012-04-05 MED ORDER — TRAMADOL HCL 50 MG PO TABS: 50.0000 mg | ORAL_TABLET | Freq: Four times a day (QID) | ORAL | Status: DC | PRN

## 2012-04-05 MED ORDER — SODIUM CHLORIDE 0.9 % IV BOLUS (SEPSIS)
1000.0000 mL | Freq: Once | INTRAVENOUS | Status: AC
  Administered 2012-04-05: 1000 mL via INTRAVENOUS

## 2012-04-05 MED ORDER — PROMETHAZINE HCL 25 MG PO TABS: 25.0000 mg | ORAL_TABLET | Freq: Four times a day (QID) | ORAL | Status: DC | PRN

## 2012-04-05 MED ORDER — DIPHENHYDRAMINE HCL 50 MG/ML IJ SOLN
25.0000 mg | Freq: Once | INTRAMUSCULAR | Status: AC
  Administered 2012-04-05: 25 mg via INTRAVENOUS
  Filled 2012-04-05: qty 1

## 2012-04-05 MED ORDER — METOCLOPRAMIDE HCL 5 MG/ML IJ SOLN
10.0000 mg | Freq: Once | INTRAMUSCULAR | Status: AC
  Administered 2012-04-05: 10 mg via INTRAVENOUS
  Filled 2012-04-05: qty 2

## 2012-04-05 MED ORDER — ALBUTEROL SULFATE HFA 108 (90 BASE) MCG/ACT IN AERS
2.0000 | INHALATION_SPRAY | Freq: Once | RESPIRATORY_TRACT | Status: AC
  Administered 2012-04-05: 2 via RESPIRATORY_TRACT
  Filled 2012-04-05: qty 6.7

## 2012-04-05 MED ORDER — KETOROLAC TROMETHAMINE 30 MG/ML IJ SOLN
30.0000 mg | Freq: Once | INTRAMUSCULAR | Status: AC
  Administered 2012-04-05: 30 mg via INTRAVENOUS
  Filled 2012-04-05: qty 1

## 2012-04-05 NOTE — ED Provider Notes (Addendum)
History     CSN: [id number]  Arrival date & time 04/05/12  1253   First MD Initiated Contact with Patient 04/05/12 1426      Chief Complaint  Patient presents with  . Headache    (Consider location/radiation/quality/duration/timing/severity/associated sxs/prior treatment) HPI.... sharp frontal headache since this morning.  Additionally, patient complains of dizziness, seeing dots in her visual field.  No gross motor or sensory deficits.  Patient has tried over-the-counter products without relief. Severity is mild to moderate.  Past Medical History  Diagnosis Date  . Asthma   . Diabetes mellitus   . Mental disorder   . Depression   . Anxiety   . Hypertension   . Hyperemesis arising during pregnancy     Past Surgical History  Procedure Laterality Date  . No past surgeries    . Tubal ligation  06/20/2011    Procedure: POST PARTUM TUBAL LIGATION;  Surgeon: Mora Bellman, MD;  Location: [facility];  Service: Gynecology;  Laterality: Bilateral;  Bilateral post partum tubal ligation    Family History  Problem Relation Age of Onset  . Heart disease Mother     CHF  . Anesthesia problems Neg Hx   . Diabetes Sister     History  Substance Use Topics  . Smoking status: Never Smoker   . Smokeless tobacco: Never Used  . Alcohol Use: Yes     Comment: occassionally    OB History   Grav Para Term Preterm Abortions TAB SAB Ect Mult Living   4 3 2 1 1  0 0 1 0 3      Review of Systems  All other systems reviewed and are negative.    Allergies  Shellfish allergy and Morphine and related  Home Medications   Current Outpatient Rx  Name  Route  Sig  Dispense  Refill  . albuterol (PROVENTIL HFA;VENTOLIN HFA) 108 (90 BASE) MCG/ACT inhaler   Inhalation   Inhale 2 puffs into the lungs every 6 (six) hours as needed. For shortness of breath         . promethazine (PHENERGAN) 25 MG tablet   Oral   Take 1 tablet (25 mg total) by mouth every 6 (six) hours as needed for nausea.   15 tablet   0   . traMADol (ULTRAM) 50 MG tablet   Oral   Take 1 tablet (50 mg total) by mouth every 6 (six) hours as needed for pain.   15 tablet   0     BP 143/92  Pulse 74  Temp(Src) 97.2 F (36.2 C) (Oral)  Resp 16  SpO2 100%  LMP 03/19/2012  Physical Exam  Nursing note and vitals reviewed. Constitutional: She is oriented to person, place, and time. She appears well-developed and well-nourished.  HENT:  Head: Normocephalic and atraumatic.  Eyes: Conjunctivae and EOM are normal. Pupils are equal, round, and reactive to light.  Neck: Normal range of motion. Neck supple.  Cardiovascular: Normal rate, regular rhythm and normal heart sounds.   Pulmonary/Chest: Effort normal and breath sounds normal.  Abdominal: Soft. Bowel sounds are normal.  Musculoskeletal: Normal range of motion.  Neurological: She is alert and oriented to person, place, and time.  Skin: Skin is warm and dry.  Psychiatric: She has a normal mood and affect.    ED Course  Procedures (including critical care time)  Labs Reviewed  CBC WITH DIFFERENTIAL - Abnormal; Notable for the following:    RBC 3.75 (*)    Hemoglobin 8.8 (*)  HCT 27.3 (*)    MCV 72.8 (*)    MCH 23.5 (*)    All other components within normal limits  POCT I-STAT, CHEM 8 - Abnormal; Notable for the following:    Creatinine, Ser 1.20 (*)    Hemoglobin 9.5 (*)    HCT 28.0 (*)    All other components within normal limits   No results found.   1. Headache       MDM  Patient feeling much better after IV fluids, IV Benadryl, Toradol, Reglan. No neuro deficits. Discharge meds tramadol #15 and Phenergan 25 mg #15.   Patient is aware of her anemia.        Nat Christen, MD 04/05/12 [place], MD 04/24/12 1435

## 2012-04-05 NOTE — ED Notes (Signed)
Pt was seen here in Feb for a headache.  CT head revealed a venous irregularity.  PT was d/c'd to follow up with neurologist, but pt does not have insurance and has not seen one since.  Pt has been having frontal headache and this am she awoke with increased pain, photophobia and seeing "dots".  AO x 4.

## 2012-04-27 ENCOUNTER — Emergency Department (HOSPITAL_COMMUNITY)
Admission: EM | Admit: 2012-04-27 | Discharge: 2012-04-27 | Disposition: A | Discharge status: Home / Self Care | Payer: Self-pay | Attending: Emergency Medicine | Admitting: Emergency Medicine

## 2012-04-27 ENCOUNTER — Emergency Department (HOSPITAL_COMMUNITY): Payer: Self-pay

## 2012-04-27 ENCOUNTER — Encounter (HOSPITAL_COMMUNITY): Payer: Self-pay | Admitting: *Deleted

## 2012-04-27 DIAGNOSIS — Z79899 Other long term (current) drug therapy: Secondary | ICD-10-CM | POA: Insufficient documentation

## 2012-04-27 DIAGNOSIS — I10 Essential (primary) hypertension: Secondary | ICD-10-CM | POA: Insufficient documentation

## 2012-04-27 DIAGNOSIS — Z3202 Encounter for pregnancy test, result negative: Secondary | ICD-10-CM | POA: Insufficient documentation

## 2012-04-27 DIAGNOSIS — L299 Pruritus, unspecified: Secondary | ICD-10-CM | POA: Insufficient documentation

## 2012-04-27 DIAGNOSIS — J45909 Unspecified asthma, uncomplicated: Secondary | ICD-10-CM | POA: Insufficient documentation

## 2012-04-27 DIAGNOSIS — H05019 Cellulitis of unspecified orbit: Secondary | ICD-10-CM | POA: Insufficient documentation

## 2012-04-27 DIAGNOSIS — E119 Type 2 diabetes mellitus without complications: Secondary | ICD-10-CM | POA: Insufficient documentation

## 2012-04-27 DIAGNOSIS — Z8659 Personal history of other mental and behavioral disorders: Secondary | ICD-10-CM | POA: Insufficient documentation

## 2012-04-27 LAB — CBC WITH DIFFERENTIAL/PLATELET
Basophils Absolute: 0 10*3/uL (ref 0.0–0.1)
Basophils Relative: 0 % (ref 0–1)
Eosinophils Absolute: 0.1 10*3/uL (ref 0.0–0.7)
MCH: 23.6 pg — ABNORMAL LOW (ref 26.0–34.0)
MCHC: 33 g/dL (ref 30.0–36.0)
Neutrophils Relative %: 79 % — ABNORMAL HIGH (ref 43–77)
Platelets: 296 10*3/uL (ref 150–400)
RBC: 4.23 MIL/uL (ref 3.87–5.11)

## 2012-04-27 LAB — BASIC METABOLIC PANEL
GFR calc Af Amer: 62 mL/min — ABNORMAL LOW (ref >90)
GFR calc non Af Amer: 53 mL/min — ABNORMAL LOW (ref >90)
Potassium: 4.1 mEq/L (ref 3.5–5.1)
Sodium: 137 mEq/L (ref 135–145)

## 2012-04-27 MED ORDER — DEXTROSE 5 % IV SOLN
2.0000 g | Freq: Once | INTRAVENOUS | Status: AC
  Administered 2012-04-27: 2 g via INTRAVENOUS
  Filled 2012-04-27: qty 2

## 2012-04-27 MED ORDER — ONDANSETRON HCL 4 MG/2ML IJ SOLN
4.0000 mg | Freq: Once | INTRAMUSCULAR | Status: AC
  Administered 2012-04-27: 4 mg via INTRAVENOUS
  Filled 2012-04-27: qty 2

## 2012-04-27 MED ORDER — CLINDAMYCIN PHOSPHATE 900 MG/50ML IV SOLN: 900.0000 mg | Freq: Once | INTRAVENOUS | Status: DC

## 2012-04-27 MED ORDER — DIPHENHYDRAMINE HCL 50 MG/ML IJ SOLN
50.0000 mg | Freq: Once | INTRAMUSCULAR | Status: AC
  Administered 2012-04-27: 50 mg via INTRAVENOUS
  Filled 2012-04-27: qty 1

## 2012-04-27 MED ORDER — IOHEXOL 300 MG/ML  SOLN
75.0000 mL | Freq: Once | INTRAMUSCULAR | Status: AC | PRN
  Administered 2012-04-27: 75 mL via INTRAVENOUS

## 2012-04-27 MED ORDER — HYDROMORPHONE HCL PF 1 MG/ML IJ SOLN
0.5000 mg | Freq: Once | INTRAMUSCULAR | Status: AC
  Administered 2012-04-27: 0.5 mg via INTRAVENOUS
  Filled 2012-04-27: qty 1

## 2012-04-27 MED ORDER — VANCOMYCIN HCL IN DEXTROSE 1-5 GM/200ML-% IV SOLN
1000.0000 mg | Freq: Once | INTRAVENOUS | Status: AC
  Administered 2012-04-27: 1000 mg via INTRAVENOUS
  Filled 2012-04-27: qty 200

## 2012-04-27 MED ORDER — DEXAMETHASONE SODIUM PHOSPHATE 10 MG/ML IJ SOLN
10.0000 mg | Freq: Once | INTRAMUSCULAR | Status: AC
  Administered 2012-04-27: 10 mg via INTRAVENOUS
  Filled 2012-04-27: qty 1

## 2012-04-27 MED ORDER — TETRACAINE HCL 0.5 % OP SOLN
1.0000 [drp] | Freq: Once | OPHTHALMIC | Status: AC
  Administered 2012-04-27: 1 [drp] via OPHTHALMIC
  Filled 2012-04-27: qty 2

## 2012-04-27 MED ORDER — FAMOTIDINE IN NACL 20-0.9 MG/50ML-% IV SOLN
20.0000 mg | Freq: Once | INTRAVENOUS | Status: AC
  Administered 2012-04-27: 20 mg via INTRAVENOUS
  Filled 2012-04-27: qty 50

## 2012-04-27 MED ORDER — CLINDAMYCIN HCL 150 MG PO CAPS: 300.0000 mg | ORAL_CAPSULE | Freq: Three times a day (TID) | ORAL | Status: DC

## 2012-04-27 MED ORDER — FLUORESCEIN SODIUM 1 MG OP STRP
1.0000 | ORAL_STRIP | Freq: Once | OPHTHALMIC | Status: AC
  Administered 2012-04-27: 15:00:00 via OPHTHALMIC
  Filled 2012-04-27: qty 1

## 2012-04-27 MED ORDER — TRAMADOL HCL 50 MG PO TABS
50.0000 mg | ORAL_TABLET | Freq: Once | ORAL | Status: AC
  Administered 2012-04-27: 50 mg via ORAL
  Filled 2012-04-27: qty 1

## 2012-04-27 MED ORDER — VANCOMYCIN HCL 500 MG IV SOLR: 500.0000 mg | Freq: Once | INTRAVENOUS | Status: DC

## 2012-04-27 NOTE — ED Notes (Signed)
PT states Tuesday she woke up and her L eye was swollen.  Since then it has swollen shut.  States it is painful.  Has been taking benadryl.

## 2012-04-27 NOTE — ED Provider Notes (Signed)
History     CSN: [id number]  Arrival date & time 04/27/12  1151   First MD Initiated Contact with Patient 04/27/12 1219      Chief Complaint  Patient presents with  . Facial Swelling    (Consider location/radiation/quality/duration/timing/severity/associated sxs/prior treatment) HPI  [name] is a 32 y.o. female complaining of left eye swelling for 3 days,. The swelling was minimal initially   and began on the upper lid. This morning when she woke up the left eye was swollen shut. Patient denies trauma, discharge, genital injection, decrease in visual acuity, diplopia, pain with eye movement, fever, new exposures. The upper lid is painful she describes it as 7/10 and she is taken Benadryl this a.m. for itching with no relief. Patient does endorse a pharyngitis that she had for 3 weeks that ended one week ago.   Past Medical History  Diagnosis Date  . Asthma   . Diabetes mellitus   . Mental disorder   . Depression   . Anxiety   . Hypertension   . Hyperemesis arising during pregnancy     Past Surgical History  Procedure Laterality Date  . No past surgeries    . Tubal ligation  06/20/2011    Procedure: POST PARTUM TUBAL LIGATION;  Surgeon: Mora Bellman, MD;  Location: [facility];  Service: Gynecology;  Laterality: Bilateral;  Bilateral post partum tubal ligation    Family History  Problem Relation Age of Onset  . Heart disease Mother     CHF  . Anesthesia problems Neg Hx   . Diabetes Sister     History  Substance Use Topics  . Smoking status: Never Smoker   . Smokeless tobacco: Never Used  . Alcohol Use: Yes     Comment: occassionally    OB History   Grav Para Term Preterm Abortions TAB SAB Ect Mult Living   4 3 2 1 1  0 0 1 0 3      Review of Systems  Constitutional: Negative for fever.  Eyes: Positive for itching. Negative for photophobia, pain, discharge, redness and visual disturbance.  Respiratory: Negative for shortness of breath.   Cardiovascular:  Negative for chest pain.  Gastrointestinal: Negative for nausea, vomiting, abdominal pain and diarrhea.  All other systems reviewed and are negative.    Allergies  Shellfish allergy and Morphine and related  Home Medications   Current Outpatient Rx  Name  Route  Sig  Dispense  Refill  . albuterol (PROVENTIL HFA;VENTOLIN HFA) 108 (90 BASE) MCG/ACT inhaler   Inhalation   Inhale 2 puffs into the lungs every 6 (six) hours as needed. For shortness of breath         . diphenhydrAMINE (BENADRYL) 25 MG tablet   Oral   Take 50 mg by mouth daily as needed for itching. For swelling           BP 125/80  Pulse 113  Temp(Src) 98.1 F (36.7 C) (Oral)  Resp 16  SpO2 100%  LMP 04/23/2012  Physical Exam  Nursing note and vitals reviewed. Constitutional: She is oriented to person, place, and time. She appears well-developed and well-nourished. No distress.  HENT:  Head: Normocephalic.  Eyes: Conjunctivae and EOM are normal. Pupils are equal, round, and reactive to light. Right eye exhibits no discharge. Left eye exhibits no discharge. No scleral icterus.  Significant swelling to left upper and lower eyelid, there is no discharge, conjunctiva are normal with no injection, pupils are equal round and reactive to light,  there is no corneal abrasion on fluorescein stain, patient has no pain with extraocular movement.  Skin is mildly erythematous with no induration or warmth  OS 20/30 OD 20/15  Neck: Normal range of motion. Neck supple.  Cardiovascular: Normal rate, regular rhythm and intact distal pulses.   Pulmonary/Chest: Effort normal. No stridor.  Abdominal: Soft. Bowel sounds are normal.  Musculoskeletal: Normal range of motion.  Neurological: She is alert and oriented to person, place, and time.  Psychiatric: She has a normal mood and affect.    ED Course  Procedures (including critical care time)  Labs Reviewed  CBC WITH DIFFERENTIAL  BASIC METABOLIC PANEL   No results  found.   No diagnosis found.    MDM   [name] is a 32 y.o. female with significant left eyelid swelling. IV Decadron, Benadryl, Pepcid do not reduce the swelling. Doubt periorbital cellulitis, because patient has not responded to conservative treatment I think CT is warranted. I discussed this with attending physician, Dr. Thurnell Garbe, who agrees with care plan.  Case signed out to PA humerus and attending physician, Dr. Ashok Cordia, at shift change: Plan is to followup results of orbital CT and blood work.  Filed Vitals:   04/27/12 1159 04/27/12 1200  BP: 125/80 125/80  Pulse: 105 113  Temp: 98.4 F (36.9 C) 98.1 F (36.7 C)  TempSrc:  Oral  Resp: 16   SpO2: 100% 100%           Monico Blitz, PA-C 04/27/12 1537

## 2012-04-27 NOTE — ED Notes (Signed)
Social Worker informed Nursing staff that pt would need to come back to the ER tomorrow am for a referral for medication assistance. Pt informed of what social worker stated.

## 2012-04-27 NOTE — ED Notes (Addendum)
Pt reports swollen left eye and cheek since tues. Today her eye was completely swollen shut.  Pt has been taking benadryl for itching. Pt denies injury or insect bite. Pain 7/10 in upper eyelid.  Pt in no immediate distress.  Pt alert oriented X4

## 2012-04-27 NOTE — ED Notes (Signed)
Called Social Work and left a message on their voice mail.

## 2012-04-28 ENCOUNTER — Encounter (HOSPITAL_COMMUNITY): Payer: Self-pay | Admitting: Cardiology

## 2012-04-28 ENCOUNTER — Emergency Department (HOSPITAL_COMMUNITY)
Admission: EM | Admit: 2012-04-28 | Discharge: 2012-04-28 | Disposition: A | Discharge status: Home / Self Care | Payer: Self-pay | Attending: Emergency Medicine | Admitting: Emergency Medicine

## 2012-04-28 DIAGNOSIS — Z79899 Other long term (current) drug therapy: Secondary | ICD-10-CM | POA: Insufficient documentation

## 2012-04-28 DIAGNOSIS — J45909 Unspecified asthma, uncomplicated: Secondary | ICD-10-CM | POA: Insufficient documentation

## 2012-04-28 DIAGNOSIS — I10 Essential (primary) hypertension: Secondary | ICD-10-CM | POA: Insufficient documentation

## 2012-04-28 DIAGNOSIS — E119 Type 2 diabetes mellitus without complications: Secondary | ICD-10-CM | POA: Insufficient documentation

## 2012-04-28 DIAGNOSIS — L03211 Cellulitis of face: Secondary | ICD-10-CM | POA: Insufficient documentation

## 2012-04-28 DIAGNOSIS — L0201 Cutaneous abscess of face: Secondary | ICD-10-CM | POA: Insufficient documentation

## 2012-04-28 DIAGNOSIS — Z8659 Personal history of other mental and behavioral disorders: Secondary | ICD-10-CM | POA: Insufficient documentation

## 2012-04-28 MED ORDER — CLINDAMYCIN HCL 300 MG PO CAPS
300.0000 mg | ORAL_CAPSULE | Freq: Once | ORAL | Status: AC
  Administered 2012-04-28: 300 mg via ORAL
  Filled 2012-04-28: qty 1

## 2012-04-28 MED ORDER — HYDROXYZINE HCL 10 MG PO TABS
10.0000 mg | ORAL_TABLET | Freq: Once | ORAL | Status: AC
  Administered 2012-04-28: 10 mg via ORAL
  Filled 2012-04-28: qty 1

## 2012-04-28 MED ORDER — DIPHENHYDRAMINE HCL 25 MG PO CAPS
25.0000 mg | ORAL_CAPSULE | Freq: Once | ORAL | Status: AC
  Administered 2012-04-28: 25 mg via ORAL
  Filled 2012-04-28: qty 1

## 2012-04-28 MED ORDER — TRAMADOL HCL 50 MG PO TABS
50.0000 mg | ORAL_TABLET | Freq: Once | ORAL | Status: AC
  Administered 2012-04-28: 50 mg via ORAL
  Filled 2012-04-28: qty 1

## 2012-04-28 NOTE — ED Provider Notes (Signed)
History     CSN: [id number]  Arrival date & time 04/28/12  1123   First MD Initiated Contact with Patient 04/28/12 1151      Chief Complaint  Patient presents with  . Facial Swelling    (Consider location/radiation/quality/duration/timing/severity/associated sxs/prior treatment) HPI  Patient presents one day after initial evaluation for full into her left eye.  She notes that since yesterday she now has increased capacity to open the eye, has no new erythema, discharge, fever, chills, nausea, vomiting. She has no new visual complaints, including no new acuity loss. She states that though she requested to discharge yesterday, she now wants additional analgesics, antibiotics. She states that she was unable to fill her prescription due to monetary concerns yesterday.   Past Medical History  Diagnosis Date  . Asthma   . Diabetes mellitus   . Mental disorder   . Depression   . Anxiety   . Hypertension   . Hyperemesis arising during pregnancy     Past Surgical History  Procedure Laterality Date  . No past surgeries    . Tubal ligation  06/20/2011    Procedure: POST PARTUM TUBAL LIGATION;  Surgeon: Mora Bellman, MD;  Location: [facility];  Service: Gynecology;  Laterality: Bilateral;  Bilateral post partum tubal ligation    Family History  Problem Relation Age of Onset  . Heart disease Mother     CHF  . Anesthesia problems Neg Hx   . Diabetes Sister     History  Substance Use Topics  . Smoking status: Never Smoker   . Smokeless tobacco: Never Used  . Alcohol Use: Yes     Comment: occassionally    OB History   Grav Para Term Preterm Abortions TAB SAB Ect Mult Living   4 3 2 1 1  0 0 1 0 3      Review of Systems  All other systems reviewed and are negative.    Allergies  Shellfish allergy and Morphine and related  Home Medications   Current Outpatient Rx  Name  Route  Sig  Dispense  Refill  . albuterol (PROVENTIL HFA;VENTOLIN HFA) 108 (90 BASE) MCG/ACT  inhaler   Inhalation   Inhale 2 puffs into the lungs every 6 (six) hours as needed. For shortness of breath         . clindamycin (CLEOCIN) 150 MG capsule   Oral   Take 2 capsules (300 mg total) by mouth 3 (three) times daily. May dispense as 150mg  capsules   42 capsule   0   . diphenhydrAMINE (BENADRYL) 25 MG tablet   Oral   Take 50 mg by mouth daily as needed for itching. For swelling           BP 128/75  Pulse 70  Temp(Src) 98 F (36.7 C) (Oral)  Resp 18  SpO2 98%  LMP 04/23/2012  Physical Exam  Nursing note and vitals reviewed. Constitutional: She is oriented to person, place, and time. She appears well-developed and well-nourished. No distress.  HENT:  Head: Normocephalic.  Eyes: Conjunctivae and EOM are normal. Pupils are equal, round, and reactive to light. Right eye exhibits no discharge. Left eye exhibits no discharge. No scleral icterus.  Significant swelling to left upper and lower eyelid, there is no discharge, conjunctiva are normal with no injection, pupils are equal round and reactive to light, patient has no pain with extraocular movement.  Skin is mildly erythematous with no induration or warmth   Neck: Normal range of motion.  Neck supple.  Cardiovascular: Normal rate, regular rhythm and intact distal pulses.   Pulmonary/Chest: Effort normal. No stridor.  Abdominal: Soft. Bowel sounds are normal.  Musculoskeletal: Normal range of motion.  Neurological: She is alert and oriented to person, place, and time.  Psychiatric: She has a normal mood and affect.    ED Course  Procedures (including critical care time)  Labs Reviewed - No data to display Ct Orbits W/cm  04/27/2012  *RADIOLOGY REPORT*  Clinical Data: Facial swelling.  Cellulitis.  CT ORBITS WITH CONTRAST  Technique:  Multidetector CT imaging of the orbits was performed following the bolus administration of intravenous contrast.  Contrast: 35mL OMNIPAQUE IOHEXOL 300 MG/ML  SOLN  Comparison: CTA  head of 02/19/2012.  Findings: Left periorbital soft tissue swelling is evident.  There is extensive edema within the left upper eyelid and supraorbital region.  No discrete abscess is present.  The globe is intact. There is no significant post-septal soft tissue swelling.  The right orbit is within normal limits.  Limited imaging of the brain is unremarkable.  A prominent polyp or mucous retention cyst is evident within the left maxillary sinus.  Minimal mucosal thickening is present in the right maxillary sinus.  The remaining paranasal sinuses and mastoid air cells are clear.  The osseous skull is intact.  IMPRESSION:  1.  Extensive left periorbital soft tissue swelling compatible with cellulitis. 2.  No discrete abscess. 3.  No significant post-septal inflammation.   Original Report Authenticated By: San Morelle, M.D.      No diagnosis found.  After the initial evaluation I reviewed the patient's chart from yesterday, including the eye exam.  Today's exam seems moderately improved.  3:58 PM Patient sitting upright, plan with her baby.  She opens her eyes spontaneously.  We discussed the need for the patient by her own antihistamines, analgesics, but we were able to accommodate financial assistance for her antibiotic. MDM  This patient presents with ongoing left thigh discomfort, though no pain, no visual loss.  A review of the patient's evaluation from yesterday, and today's exam are consistent with ongoing cellulitis, though without acute progression.  The patient has not taken any antibiotics at home, but remains in no distress, suggesting that she will likely respond appropriately to oral medication.  She was discharged with return precautions, ophthalmology followup.        Carmin Muskrat, MD 04/28/12 [phone]

## 2012-04-28 NOTE — ED Notes (Signed)
Pt reports she was seen yesterday for swelling to the left eye and given antibiotics. States they wanted to keep her but she did not want to stay. Told to follow up this morning to possibly receive more antibiotics.

## 2012-04-28 NOTE — Progress Notes (Signed)
   CARE MANAGEMENT ED NOTE 04/28/2012  Patient:  TALESE, DEJULIO   Account Number:  [id number]  Date Initiated:  04/28/2012  Documentation initiated by:  Kellie Moor  Subjective/Objective Assessment:   presented to ED with c/o facial swelling     Subjective/Objective Assessment Detail:     Action/Plan:   requesting medication assistance   Action/Plan Detail:   Anticipated DC Date:  04/28/2012     Status Recommendation to Physician:   Result of Recommendation:      DC Planning Services  CM consult  [facility]    Choice offered to / List presented to:            Status of service:  Completed, signed off  ED Comments:   ED Comments Detail:  04/28/12-1523-J.Jariah Tarkowski,RN,BSN [id number]      Assessed patient for [employer] Patient is elligible. Enrolled in [facility]. Letter given with instructions. $3 copay along with MATCH guidelines of once a rolling calendar year explained. Voiced understanding. No further needs identified.  04/28/12-1459-J.Contrina Orona,RN,BSN [id number]      Received call from Dr. Vanita Panda with referral for medication assistance. Reports that patient was here yesterday with same c/o and discharged home with prescription for clindamycin. Reports that she was unable to fill prescription due to financial difficulties and no insurance. Will assess patient for [facility] program.

## 2012-04-29 NOTE — ED Provider Notes (Signed)
Medical screening examination/treatment/procedure(s) were performed by non-physician practitioner and as supervising physician I was immediately available for consultation/collaboration.   Alfonzo Feller, DO 04/29/12 1139

## 2012-05-18 ENCOUNTER — Emergency Department (HOSPITAL_COMMUNITY)
Admission: EM | Admit: 2012-05-18 | Discharge: 2012-05-18 | Disposition: A | Discharge status: Home / Self Care | Payer: Self-pay | Attending: Emergency Medicine | Admitting: Emergency Medicine

## 2012-05-18 ENCOUNTER — Encounter (HOSPITAL_COMMUNITY): Payer: Self-pay | Admitting: Emergency Medicine

## 2012-05-18 DIAGNOSIS — F489 Nonpsychotic mental disorder, unspecified: Secondary | ICD-10-CM | POA: Insufficient documentation

## 2012-05-18 DIAGNOSIS — F329 Major depressive disorder, single episode, unspecified: Secondary | ICD-10-CM | POA: Insufficient documentation

## 2012-05-18 DIAGNOSIS — F411 Generalized anxiety disorder: Secondary | ICD-10-CM | POA: Insufficient documentation

## 2012-05-18 DIAGNOSIS — R1013 Epigastric pain: Secondary | ICD-10-CM | POA: Insufficient documentation

## 2012-05-18 DIAGNOSIS — F3289 Other specified depressive episodes: Secondary | ICD-10-CM | POA: Insufficient documentation

## 2012-05-18 DIAGNOSIS — J45909 Unspecified asthma, uncomplicated: Secondary | ICD-10-CM | POA: Insufficient documentation

## 2012-05-18 DIAGNOSIS — H53149 Visual discomfort, unspecified: Secondary | ICD-10-CM | POA: Insufficient documentation

## 2012-05-18 DIAGNOSIS — R51 Headache: Secondary | ICD-10-CM | POA: Insufficient documentation

## 2012-05-18 DIAGNOSIS — Z8742 Personal history of other diseases of the female genital tract: Secondary | ICD-10-CM | POA: Insufficient documentation

## 2012-05-18 DIAGNOSIS — R110 Nausea: Secondary | ICD-10-CM | POA: Insufficient documentation

## 2012-05-18 DIAGNOSIS — Z885 Allergy status to narcotic agent status: Secondary | ICD-10-CM | POA: Insufficient documentation

## 2012-05-18 DIAGNOSIS — Z79899 Other long term (current) drug therapy: Secondary | ICD-10-CM | POA: Insufficient documentation

## 2012-05-18 DIAGNOSIS — E119 Type 2 diabetes mellitus without complications: Secondary | ICD-10-CM | POA: Insufficient documentation

## 2012-05-18 DIAGNOSIS — D509 Iron deficiency anemia, unspecified: Secondary | ICD-10-CM | POA: Insufficient documentation

## 2012-05-18 DIAGNOSIS — Z91013 Allergy to seafood: Secondary | ICD-10-CM | POA: Insufficient documentation

## 2012-05-18 DIAGNOSIS — I10 Essential (primary) hypertension: Secondary | ICD-10-CM | POA: Insufficient documentation

## 2012-05-18 LAB — CBC WITH DIFFERENTIAL/PLATELET
Eosinophils Absolute: 0.1 10*3/uL (ref 0.0–0.7)
Eosinophils Relative: 3 % (ref 0–5)
HCT: 28.1 % — ABNORMAL LOW (ref 36.0–46.0)
Lymphs Abs: 0.9 10*3/uL (ref 0.7–4.0)
MCH: 23.5 pg — ABNORMAL LOW (ref 26.0–34.0)
MCV: 70.3 fL — ABNORMAL LOW (ref 78.0–100.0)
Monocytes Absolute: 0.5 10*3/uL (ref 0.1–1.0)
Monocytes Relative: 15 % — ABNORMAL HIGH (ref 3–12)
Platelets: 194 10*3/uL (ref 150–400)
RBC: 4 MIL/uL (ref 3.87–5.11)

## 2012-05-18 LAB — URINALYSIS, MICROSCOPIC ONLY
Nitrite: NEGATIVE
Protein, ur: 30 mg/dL — AB
Specific Gravity, Urine: 1.016 (ref 1.005–1.030)
Urobilinogen, UA: 1 mg/dL (ref 0.0–1.0)

## 2012-05-18 LAB — COMPREHENSIVE METABOLIC PANEL
BUN: 14 mg/dL (ref 6–23)
Calcium: 9.2 mg/dL (ref 8.4–10.5)
Creatinine, Ser: 1.23 mg/dL — ABNORMAL HIGH (ref 0.50–1.10)
GFR calc Af Amer: 66 mL/min — ABNORMAL LOW (ref >90)
GFR calc non Af Amer: 57 mL/min — ABNORMAL LOW (ref >90)
Glucose, Bld: 107 mg/dL — ABNORMAL HIGH (ref 70–99)
Total Protein: 8.1 g/dL (ref 6.0–8.3)

## 2012-05-18 LAB — LIPASE, BLOOD: Lipase: 22 U/L (ref 11–59)

## 2012-05-18 MED ORDER — SODIUM CHLORIDE 0.9 % IV BOLUS (SEPSIS)
1000.0000 mL | Freq: Once | INTRAVENOUS | Status: AC
  Administered 2012-05-18: 1000 mL via INTRAVENOUS

## 2012-05-18 MED ORDER — DIPHENHYDRAMINE HCL 50 MG/ML IJ SOLN
25.0000 mg | Freq: Once | INTRAMUSCULAR | Status: DC
Start: 2012-05-18 — End: 2012-05-18

## 2012-05-18 MED ORDER — METOCLOPRAMIDE HCL 5 MG/ML IJ SOLN
10.0000 mg | Freq: Once | INTRAMUSCULAR | Status: AC
  Administered 2012-05-18: 10 mg via INTRAVENOUS
  Filled 2012-05-18: qty 2

## 2012-05-18 MED ORDER — BUTALBITAL-APAP-CAFFEINE 50-325-40 MG PO TABS: 1.0000 | ORAL_TABLET | Freq: Four times a day (QID) | ORAL | Status: DC | PRN

## 2012-05-18 NOTE — ED Notes (Signed)
Requested urine sample from patient.  Patient supplied with urine cup and sani-wipe.

## 2012-05-18 NOTE — ED Notes (Signed)
IV team to start IV.

## 2012-05-18 NOTE — ED Provider Notes (Signed)
Medical screening examination/treatment/procedure(s) were performed by non-physician practitioner and as supervising physician I was immediately available for consultation/collaboration.  Richarda Blade, MD 05/18/12 [phone]

## 2012-05-18 NOTE — ED Notes (Addendum)
Pt c/o headache and body aches. Pt reports headache ongoing for some time, woke up today with body aches and difficulty moving. Pt has not taken anything for pain today. Pt has a son who currently has a cold. Pt denies injuries. Pt c/o generalized abdominal pain with N/V.

## 2012-05-18 NOTE — ED Notes (Signed)
Pt has infant son with her in room. Reports not having anyone to help with child. Pt falling asleep in bed and chair while holding infant. Explained to pt risk of having pain medications or medications that would make her drowsy while holding the child. PA-C informed.

## 2012-05-18 NOTE — ED Notes (Signed)
NAD was noted at time of d/c home

## 2012-05-18 NOTE — ED Provider Notes (Signed)
History     CSN: [id number]  Arrival date & time 05/18/12  1020   First MD Initiated Contact with Patient 05/18/12 1108      Chief Complaint  Patient presents with  . Generalized Body Aches    (Consider location/radiation/quality/duration/timing/severity/associated sxs/prior treatment) HPI  [name] is a 32 y.o. female complaining of exacerbation of chronic headache and onset of epigastric pain associated with nausea onset this a.m. Patient denies fever, emesis, chest pain, shortness of breath, change in bowel or bladder habits. Patient states she has chronic migraines but this is different in severity, it is located in the bilateral frontal areas there is a mild photophobia, pain is exacerbated when she turns her head to the right common patient denies stiff neck, rash. Patient is not taking any medication for her diabetes she states that after she gave birth to her child her doctor told her it was not necessary.  Past Medical History  Diagnosis Date  . Asthma   . Diabetes mellitus   . Mental disorder   . Depression   . Anxiety   . Hypertension   . Hyperemesis arising during pregnancy     Past Surgical History  Procedure Laterality Date  . No past surgeries    . Tubal ligation  06/20/2011    Procedure: POST PARTUM TUBAL LIGATION;  Surgeon: Mora Bellman, MD;  Location: [facility];  Service: Gynecology;  Laterality: Bilateral;  Bilateral post partum tubal ligation    Family History  Problem Relation Age of Onset  . Heart disease Mother     CHF  . Anesthesia problems Neg Hx   . Diabetes Sister     History  Substance Use Topics  . Smoking status: Never Smoker   . Smokeless tobacco: Never Used  . Alcohol Use: Yes     Comment: occassionally    OB History   Grav Para Term Preterm Abortions TAB SAB Ect Mult Living   4 3 2 1 1  0 0 1 0 3      Review of Systems  Constitutional: Negative for fever.  Respiratory: Negative for shortness of breath.   Cardiovascular:  Negative for chest pain.  Gastrointestinal: Positive for nausea and abdominal pain. Negative for vomiting and diarrhea.  Neurological: Positive for headaches.  All other systems reviewed and are negative.    Allergies  Shellfish allergy and Morphine and related  Home Medications   Current Outpatient Rx  Name  Route  Sig  Dispense  Refill  . albuterol (PROVENTIL HFA;VENTOLIN HFA) 108 (90 BASE) MCG/ACT inhaler   Inhalation   Inhale 2 puffs into the lungs every 6 (six) hours as needed for wheezing. For shortness of breath         . diphenhydrAMINE (BENADRYL) 25 MG tablet   Oral   Take 50 mg by mouth daily as needed for itching. For swelling           BP 130/86  Pulse 82  Temp(Src) 98.5 F (36.9 C) (Oral)  Resp 18  SpO2 96%  LMP 05/18/2012  Breastfeeding? No  Physical Exam  Nursing note and vitals reviewed. Constitutional: She is oriented to person, place, and time. She appears well-developed and well-nourished. No distress.  HENT:  Head: Normocephalic.  Mouth/Throat: Oropharynx is clear and moist.  Eyes: Conjunctivae and EOM are normal. Pupils are equal, round, and reactive to light.  Neck: Normal range of motion.  Patient has reduced range of motion to right spinal flexion, she states that it causes  the pain in the frontal area.    No tenderness to deep palpation of the posterior cervical spine. Patient can flex chin to chest with no pain.   Cardiovascular: Normal rate, regular rhythm and intact distal pulses.   Pulmonary/Chest: Effort normal and breath sounds normal. No stridor. No respiratory distress. She has no wheezes. She has no rales. She exhibits no tenderness.  Abdominal: Soft. Bowel sounds are normal. She exhibits no distension and no mass. There is tenderness. There is no rebound and no guarding.  Mild tenderness to deep palpation of the epigastrium, no guarding rebound, bowel sounds are normal  Musculoskeletal: Normal range of motion.  Neurological: She  is alert and oriented to person, place, and time.  Psychiatric: She has a normal mood and affect.    ED Course  Procedures (including critical care time)  Labs Reviewed  CBC WITH DIFFERENTIAL - Abnormal; Notable for the following:    WBC 3.5 (*)    Hemoglobin 9.4 (*)    HCT 28.1 (*)    MCV 70.3 (*)    MCH 23.5 (*)    RDW 15.6 (*)    Monocytes Relative 15 (*)    All other components within normal limits  COMPREHENSIVE METABOLIC PANEL - Abnormal; Notable for the following:    Glucose, Bld 107 (*)    Creatinine, Ser 1.23 (*)    Albumin 3.4 (*)    GFR calc non Af Amer 57 (*)    GFR calc Af Amer 66 (*)    All other components within normal limits  LIPASE, BLOOD  URINALYSIS, MICROSCOPIC ONLY  POCT PREGNANCY, URINE   No results found.  1:24 PM reports significant improvement in her pain and is requesting discharge at this time   1. Iron deficiency anemia   2. Headache   3. Epigastric pain       MDM   [name] is a 32 y.o. female abdominal pain and headache. Dental exam is nonsurgical, I doubt meningitis as patient is afebrile and has no meningeal signs. Plan is IV fluids, pain Rx, basic blood work.  Patient has normal urinalysis, blood work shows a microcytic anemia.   Patient reports significant subjective improvement with Reglan, Benadryl was not needed administered secondary to patient drowsiness.   Filed Vitals:   05/18/12 1026 05/18/12 1143  BP: 130/86 100/48  Pulse: 82 103  Temp: 98.5 F (36.9 C) 98.9 F (37.2 C)  TempSrc: Oral Oral  Resp: 18 20  SpO2: 96% 100%     VSS and patient is appropriate for and amenable to discharge at this time. Pt verbalized understanding and agrees with care plan. Outpatient follow-up and return precautions given.     New Prescriptions   BUTALBITAL-ACETAMINOPHEN-CAFFEINE (FIORICET) 50-325-40 MG PER TABLET    Take 1-2 tablets by mouth every 6 (six) hours as needed for headache.          Monico Blitz,  PA-C 05/18/12 1359

## 2012-05-18 NOTE — ED Notes (Signed)
Unable to gain IV access. IV team paged at patient request.  [place], PA-C notified.

## 2012-07-19 ENCOUNTER — Emergency Department (HOSPITAL_COMMUNITY)
Admission: EM | Admit: 2012-07-19 | Discharge: 2012-07-19 | Disposition: A | Discharge status: Home / Self Care | Payer: Self-pay | Attending: Emergency Medicine | Admitting: Emergency Medicine

## 2012-07-19 ENCOUNTER — Encounter (HOSPITAL_COMMUNITY): Payer: Self-pay | Admitting: Emergency Medicine

## 2012-07-19 DIAGNOSIS — I10 Essential (primary) hypertension: Secondary | ICD-10-CM | POA: Insufficient documentation

## 2012-07-19 DIAGNOSIS — K044 Acute apical periodontitis of pulpal origin: Secondary | ICD-10-CM | POA: Insufficient documentation

## 2012-07-19 DIAGNOSIS — Z79899 Other long term (current) drug therapy: Secondary | ICD-10-CM | POA: Insufficient documentation

## 2012-07-19 DIAGNOSIS — J45909 Unspecified asthma, uncomplicated: Secondary | ICD-10-CM | POA: Insufficient documentation

## 2012-07-19 DIAGNOSIS — K089 Disorder of teeth and supporting structures, unspecified: Secondary | ICD-10-CM | POA: Insufficient documentation

## 2012-07-19 DIAGNOSIS — R51 Headache: Secondary | ICD-10-CM | POA: Insufficient documentation

## 2012-07-19 DIAGNOSIS — Z8659 Personal history of other mental and behavioral disorders: Secondary | ICD-10-CM | POA: Insufficient documentation

## 2012-07-19 DIAGNOSIS — K0889 Other specified disorders of teeth and supporting structures: Secondary | ICD-10-CM

## 2012-07-19 DIAGNOSIS — K047 Periapical abscess without sinus: Secondary | ICD-10-CM

## 2012-07-19 DIAGNOSIS — E119 Type 2 diabetes mellitus without complications: Secondary | ICD-10-CM | POA: Insufficient documentation

## 2012-07-19 MED ORDER — PENICILLIN V POTASSIUM 250 MG PO TABS
500.0000 mg | ORAL_TABLET | Freq: Once | ORAL | Status: AC
  Administered 2012-07-19: 500 mg via ORAL
  Filled 2012-07-19: qty 2

## 2012-07-19 MED ORDER — HYDROCODONE-ACETAMINOPHEN 5-325 MG PO TABS
2.0000 | ORAL_TABLET | Freq: Once | ORAL | Status: AC
  Administered 2012-07-19: 2 via ORAL
  Filled 2012-07-19: qty 2

## 2012-07-19 MED ORDER — HYDROCODONE-ACETAMINOPHEN 5-325 MG PO TABS: 1.0000 | ORAL_TABLET | ORAL | Status: DC | PRN

## 2012-07-19 MED ORDER — PENICILLIN V POTASSIUM 500 MG PO TABS: 500.0000 mg | ORAL_TABLET | Freq: Four times a day (QID) | ORAL | Status: DC

## 2012-07-19 NOTE — ED Provider Notes (Signed)
History    CSN: [id number] Arrival date & time 07/19/12  [id number]  First MD Initiated Contact with Patient 07/19/12 [phone]     Chief Complaint  Patient presents with  . Dental Pain   (Consider location/radiation/quality/duration/timing/severity/associated sxs/prior Treatment) HPI  [name] is a 32 y.o. female  with a hx of asthma, DM, anxiety, HTN, depression, presents to the Emergency Department complaining of gradual, persistent, progressively worsening right sided dental pain beginning 3 days ago.   Associated symptoms include headache.  Pt was taking Goody powder initially which was helping but nothing seems to make it better.  Eating makes it worse.  Pt denies fever, chills, neck pain, chest pain, SOB, nasal congestion, cough, abdominal pain, N/V/D, weakness, dizziness.  Pt cannot recall the last time she saw a dentist. She c/o broken teeth in her mouth, but denies recent breakage of teeth.       Past Medical History  Diagnosis Date  . Asthma   . Diabetes mellitus   . Mental disorder   . Depression   . Anxiety   . Hypertension   . Hyperemesis arising during pregnancy    Past Surgical History  Procedure Laterality Date  . No past surgeries    . Tubal ligation  06/20/2011    Procedure: POST PARTUM TUBAL LIGATION;  Surgeon: Mora Bellman, MD;  Location: [facility];  Service: Gynecology;  Laterality: Bilateral;  Bilateral post partum tubal ligation   Family History  Problem Relation Age of Onset  . Heart disease Mother     CHF  . Anesthesia problems Neg Hx   . Diabetes Sister    History  Substance Use Topics  . Smoking status: Never Smoker   . Smokeless tobacco: Never Used  . Alcohol Use: Yes     Comment: occassionally   OB History   Grav Para Term Preterm Abortions TAB SAB Ect Mult Living   4 3 2 1 1  0 0 1 0 3     Review of Systems  Constitutional: Negative for fever, chills and appetite change.  HENT: Positive for dental problem. Negative for ear pain, nosebleeds,  facial swelling, rhinorrhea, drooling, trouble swallowing, neck pain, neck stiffness and postnasal drip.   Eyes: Negative for pain and redness.  Respiratory: Negative for cough and wheezing.   Cardiovascular: Negative for chest pain.  Gastrointestinal: Negative for nausea, vomiting and abdominal pain.  Skin: Negative for color change and rash.  Neurological: Positive for headaches. Negative for weakness and light-headedness.  All other systems reviewed and are negative.    Allergies  Shellfish allergy and Morphine and related  Home Medications   Current Outpatient Rx  Name  Route  Sig  Dispense  Refill  . albuterol (PROVENTIL HFA;VENTOLIN HFA) 108 (90 BASE) MCG/ACT inhaler   Inhalation   Inhale 2 puffs into the lungs every 6 (six) hours as needed for wheezing. For shortness of breath         . butalbital-acetaminophen-caffeine (FIORICET) 50-325-40 MG per tablet   Oral   Take 1-2 tablets by mouth every 6 (six) hours as needed for headache.   20 tablet   0   . diphenhydrAMINE (BENADRYL) 25 MG tablet   Oral   Take 50 mg by mouth daily as needed for itching. For swelling         . HYDROcodone-acetaminophen (NORCO/VICODIN) 5-325 MG per tablet   Oral   Take 1-2 tablets by mouth every 4 (four) hours as needed for pain.   15  tablet   0   . penicillin v potassium (VEETID) 500 MG tablet   Oral   Take 1 tablet (500 mg total) by mouth 4 (four) times daily.   40 tablet   0    BP 147/87  Pulse 72  Temp(Src) 98.1 F (36.7 C) (Oral)  Resp 18  SpO2 100%  LMP 07/13/2012 Physical Exam  Nursing note and vitals reviewed. Constitutional: She appears well-developed and well-nourished.  HENT:  Head: Normocephalic and atraumatic.  Right Ear: Tympanic membrane, external ear and ear canal normal.  Left Ear: Tympanic membrane, external ear and ear canal normal.  Nose: Nose normal. Right sinus exhibits no maxillary sinus tenderness and no frontal sinus tenderness. Left sinus  exhibits no maxillary sinus tenderness and no frontal sinus tenderness.  Mouth/Throat: Uvula is midline, oropharynx is clear and moist and mucous membranes are normal. Mucous membranes are not dry. No oral lesions. Abnormal dentition. Dental caries present. No dental abscesses, edematous or lacerations. No oropharyngeal exudate, posterior oropharyngeal edema, posterior oropharyngeal erythema or tonsillar abscesses.    Caries throughout Broken teeth throughout Mild erythema and swelling of the gums on the right top and bottom without gross abscess or induration No induration of the floor of the mouth   Eyes: Conjunctivae are normal. Pupils are equal, round, and reactive to light. Right eye exhibits no discharge. Left eye exhibits no discharge.  Neck: Normal range of motion. Neck supple.  Cardiovascular: Normal rate, regular rhythm and normal heart sounds.   Pulmonary/Chest: Effort normal and breath sounds normal. No respiratory distress. She has no wheezes.  Abdominal: Soft. Bowel sounds are normal. She exhibits no distension. There is no tenderness.  Lymphadenopathy:       Head (right side): No submental, no submandibular, no tonsillar, no preauricular, no posterior auricular and no occipital adenopathy present.       Head (left side): No submental, no submandibular, no tonsillar, no preauricular, no posterior auricular and no occipital adenopathy present.    She has no cervical adenopathy.       Right cervical: No superficial cervical, no deep cervical and no posterior cervical adenopathy present.      Left cervical: No superficial cervical, no deep cervical and no posterior cervical adenopathy present.  Neurological: She is alert.  Skin: Skin is warm and dry.  Psychiatric: She has a normal mood and affect.    ED Course  Procedures (including critical care time) Labs Reviewed - No data to display No results found. 1. Pain, dental   2. Dental infection     MDM  Alphonsa Gin presents  with dental pain.   No gross abscess.  Exam unconcerning for Ludwig's angina or spread of infection.  Will treat with penicillin and pain medicine.  Urged patient to follow-up with dentist.  I have also discussed reasons to return immediately to the ER.  Patient expresses understanding and agrees with plan.     Jarrett Soho Senan Urey, PA-C 07/19/12 [phone]

## 2012-07-19 NOTE — ED Provider Notes (Signed)
Medical screening examination/treatment/procedure(s) were performed by non-physician practitioner and as supervising physician I was immediately available for consultation/collaboration.   Blanchie Dessert, MD 07/19/12 [phone]

## 2012-07-19 NOTE — ED Notes (Signed)
PT. REPORTS RIGHT UPPER AND LOWER MOLAR PAIN FOR 3 DAYS .

## 2012-08-05 ENCOUNTER — Encounter (HOSPITAL_COMMUNITY): Payer: Self-pay | Admitting: *Deleted

## 2012-08-05 ENCOUNTER — Emergency Department (HOSPITAL_COMMUNITY)
Admission: EM | Admit: 2012-08-05 | Discharge: 2012-08-05 | Disposition: A | Discharge status: Home / Self Care | Payer: Self-pay | Attending: Emergency Medicine | Admitting: Emergency Medicine

## 2012-08-05 DIAGNOSIS — H9209 Otalgia, unspecified ear: Secondary | ICD-10-CM | POA: Insufficient documentation

## 2012-08-05 DIAGNOSIS — E119 Type 2 diabetes mellitus without complications: Secondary | ICD-10-CM | POA: Insufficient documentation

## 2012-08-05 DIAGNOSIS — J45909 Unspecified asthma, uncomplicated: Secondary | ICD-10-CM | POA: Insufficient documentation

## 2012-08-05 DIAGNOSIS — K089 Disorder of teeth and supporting structures, unspecified: Secondary | ICD-10-CM | POA: Insufficient documentation

## 2012-08-05 DIAGNOSIS — J029 Acute pharyngitis, unspecified: Secondary | ICD-10-CM | POA: Insufficient documentation

## 2012-08-05 DIAGNOSIS — K0381 Cracked tooth: Secondary | ICD-10-CM | POA: Insufficient documentation

## 2012-08-05 DIAGNOSIS — K0889 Other specified disorders of teeth and supporting structures: Secondary | ICD-10-CM

## 2012-08-05 DIAGNOSIS — J3489 Other specified disorders of nose and nasal sinuses: Secondary | ICD-10-CM | POA: Insufficient documentation

## 2012-08-05 DIAGNOSIS — Z8659 Personal history of other mental and behavioral disorders: Secondary | ICD-10-CM | POA: Insufficient documentation

## 2012-08-05 DIAGNOSIS — I10 Essential (primary) hypertension: Secondary | ICD-10-CM | POA: Insufficient documentation

## 2012-08-05 MED ORDER — HYDROCODONE-ACETAMINOPHEN 5-325 MG PO TABS: 1.0000 | ORAL_TABLET | Freq: Four times a day (QID) | ORAL | Status: DC | PRN

## 2012-08-05 MED ORDER — IBUPROFEN 600 MG PO TABS: 600.0000 mg | ORAL_TABLET | Freq: Four times a day (QID) | ORAL | Status: DC | PRN

## 2012-08-05 MED ORDER — OXYCODONE-ACETAMINOPHEN 5-325 MG PO TABS
1.0000 | ORAL_TABLET | Freq: Once | ORAL | Status: AC
  Administered 2012-08-05: 1 via ORAL
  Filled 2012-08-05: qty 1

## 2012-08-05 NOTE — ED Notes (Signed)
Pt c/o Left upper molar pain due to a broken tooth, states the pain is radiating to her Left ear 2-3 days

## 2012-08-05 NOTE — ED Notes (Signed)
Pt states that she has a broken left upper molar. Pt reports that pain goes into ear.

## 2012-08-05 NOTE — ED Provider Notes (Signed)
History  This chart was scribed for non-physician practitioner Jeannett Senior, PA-C, working with Richarda Blade, MD, by Neta Ehlers, ED Scribe. This patient was seen in room TR08C/TR08C and the patient's care was started at 6:26 PM.  CSN: [id number] Arrival date & time 08/05/12  1721  First MD Initiated Contact with Patient 08/05/12 1811     Chief Complaint  Patient presents with  . Dental Pain  . Otalgia    The history is provided by the patient. No language interpreter was used.   HPI Comments: [name] is a 32 y.o. female who presents to the Emergency Department complaining of pain to her left upper molar which radiates to her ear. She states that the pain has been radiating to her ear for the past three days, but that she has experienced dental pain for several weeks. She states she has experienced a sore throat and slight rhinorrhea. The pt denies a fever. She reports using ibuprofen and tylenol to mitigate the pain, but without resolution. She also reports that she has not finished her prescribed dosage of antibiotics from her previous visit.   Past Medical History  Diagnosis Date  . Asthma   . Diabetes mellitus   . Mental disorder   . Depression   . Anxiety   . Hypertension   . Hyperemesis arising during pregnancy    Past Surgical History  Procedure Laterality Date  . No past surgeries    . Tubal ligation  06/20/2011    Procedure: POST PARTUM TUBAL LIGATION;  Surgeon: Mora Bellman, MD;  Location: [facility];  Service: Gynecology;  Laterality: Bilateral;  Bilateral post partum tubal ligation   Family History  Problem Relation Age of Onset  . Heart disease Mother     CHF  . Anesthesia problems Neg Hx   . Diabetes Sister    History  Substance Use Topics  . Smoking status: Never Smoker   . Smokeless tobacco: Never Used  . Alcohol Use: Yes     Comment: occassionally   OB History   Grav Para Term Preterm Abortions TAB SAB Ect Mult Living   4 3 2 1 1  0 0 1  0 3     Review of Systems  Constitutional: Negative for fever.  HENT: Positive for ear pain, sore throat, rhinorrhea and dental problem.   All other systems reviewed and are negative.    Allergies  Shellfish allergy and Morphine and related  Home Medications   Current Outpatient Rx  Name  Route  Sig  Dispense  Refill  . penicillin v potassium (VEETID) 500 MG tablet   Oral   Take 500 mg by mouth 4 (four) times daily. Start date and duration unknown to pt          Triage Vitals: BP 116/76  Pulse 81  Temp(Src) 98.1 F (36.7 C) (Oral)  Resp 17  SpO2 100%  LMP 07/13/2012  Physical Exam  Nursing note and vitals reviewed. Constitutional: She is oriented to person, place, and time. She appears well-developed and well-nourished. No distress.  HENT:  Head: Normocephalic and atraumatic.  Right Ear: External ear normal.  Left Ear: External ear normal.  Fluid behind TM. TM is normal color.  Poor dentition. Left upper first molar is decayed to the gumline. No surrounding gum-swelling or evidence of an abscess. But area is tender to palpation. No facial swelling seen.   Eyes: EOM are normal.  Neck: Neck supple. No tracheal deviation present.  No submandibular  or cervical lymphadenopathy.   Cardiovascular: Normal rate.   Pulmonary/Chest: Effort normal. No respiratory distress.  Musculoskeletal: Normal range of motion.  Lymphadenopathy:    She has no cervical adenopathy.  Neurological: She is alert and oriented to person, place, and time.  Skin: Skin is warm and dry.  Psychiatric: She has a normal mood and affect. Her behavior is normal.    ED Course  Procedures (including critical care time)  DIAGNOSTIC STUDIES: Oxygen Saturation is 100% on room air, normal by my interpretation.    COORDINATION OF CARE:  6:31 PM-Discussed treatment plan with patient which includes a follow-up with a dentist, a decongestant, and previously prescribed antibiotics which she needs to finish.   The patient agreed to the plan.    Labs Reviewed - No data to display No results found.  1. Pain, dental     MDM  Pt with multiple carries and broken teeth. No signs of dental abscess or cellulitis at this time. Pt is afebrile. Non toxic. Pt just started amoxicillin 3 days ago. Continue that. norco and ibuprofen at home for pain. Follow up with a dentist.   Filed Vitals:   08/05/12 1729 08/05/12 1730  BP:  116/76  Pulse: 81   Temp: 98.1 F (36.7 C)   TempSrc: Oral   Resp: 17   SpO2: 100%     I personally performed the services described in this documentation, which was scribed in my presence. The recorded information has been reviewed and is accurate.   Renold Genta, PA-C 08/05/12 2359

## 2012-08-06 NOTE — ED Provider Notes (Signed)
Medical screening examination/treatment/procedure(s) were performed by non-physician practitioner and as supervising physician I was immediately available for consultation/collaboration.  Richarda Blade, MD 08/06/12 [phone]

## 2012-10-24 ENCOUNTER — Emergency Department (HOSPITAL_COMMUNITY): Payer: Self-pay

## 2012-10-24 ENCOUNTER — Emergency Department (HOSPITAL_COMMUNITY)
Admission: EM | Admit: 2012-10-24 | Discharge: 2012-10-24 | Disposition: A | Discharge status: Home / Self Care | Payer: Self-pay | Attending: Emergency Medicine | Admitting: Emergency Medicine

## 2012-10-24 ENCOUNTER — Encounter (HOSPITAL_COMMUNITY): Payer: Self-pay | Admitting: Nurse Practitioner

## 2012-10-24 DIAGNOSIS — E119 Type 2 diabetes mellitus without complications: Secondary | ICD-10-CM | POA: Insufficient documentation

## 2012-10-24 DIAGNOSIS — Z8659 Personal history of other mental and behavioral disorders: Secondary | ICD-10-CM | POA: Insufficient documentation

## 2012-10-24 DIAGNOSIS — Y929 Unspecified place or not applicable: Secondary | ICD-10-CM | POA: Insufficient documentation

## 2012-10-24 DIAGNOSIS — W102XXA Fall (on)(from) incline, initial encounter: Secondary | ICD-10-CM

## 2012-10-24 DIAGNOSIS — Z8742 Personal history of other diseases of the female genital tract: Secondary | ICD-10-CM | POA: Insufficient documentation

## 2012-10-24 DIAGNOSIS — S0993XA Unspecified injury of face, initial encounter: Secondary | ICD-10-CM | POA: Insufficient documentation

## 2012-10-24 DIAGNOSIS — S63509A Unspecified sprain of unspecified wrist, initial encounter: Secondary | ICD-10-CM | POA: Insufficient documentation

## 2012-10-24 DIAGNOSIS — Y939 Activity, unspecified: Secondary | ICD-10-CM | POA: Insufficient documentation

## 2012-10-24 DIAGNOSIS — W08XXXA Fall from other furniture, initial encounter: Secondary | ICD-10-CM | POA: Insufficient documentation

## 2012-10-24 DIAGNOSIS — J45909 Unspecified asthma, uncomplicated: Secondary | ICD-10-CM | POA: Insufficient documentation

## 2012-10-24 DIAGNOSIS — I10 Essential (primary) hypertension: Secondary | ICD-10-CM | POA: Insufficient documentation

## 2012-10-24 MED ORDER — HYDROCODONE-ACETAMINOPHEN 5-325 MG PO TABS: 1.0000 | ORAL_TABLET | ORAL | Status: DC | PRN

## 2012-10-24 MED ORDER — CEPHALEXIN 500 MG PO CAPS: 500.0000 mg | ORAL_CAPSULE | Freq: Four times a day (QID) | ORAL | Status: DC

## 2012-10-24 MED ORDER — CEPHALEXIN 250 MG PO CAPS
500.0000 mg | ORAL_CAPSULE | Freq: Once | ORAL | Status: AC
  Administered 2012-10-24: 500 mg via ORAL
  Filled 2012-10-24: qty 2

## 2012-10-24 NOTE — ED Notes (Signed)
Pt fell off a dresser last week trying to kill a spider and hit her lower lip and R wrist. C/o R wrist pain and lower lip pain since. States some of her teeth feel loose and now her lip has pus draining from it .

## 2012-10-24 NOTE — Progress Notes (Signed)
Orthopedic Tech Progress Note Patient Details:  [name] 1980-09-23 [id number]  Ortho Devices Type of Ortho Device: Velcro wrist splint Ortho Device/Splint Location: rue Ortho Device/Splint Interventions: Application   Ryon Layton 10/24/2012, 5:16 PM

## 2012-10-24 NOTE — ED Notes (Signed)
Patient transported to X-ray 

## 2012-10-24 NOTE — ED Provider Notes (Signed)
CSN: [id number]     Arrival date & time 10/24/12  1548 History  This chart was scribed for non-physician practitioner Delos Haring, PA-C working with Jasper Riling. Alvino Chapel, MD by Rolanda Lundborg, ED Scribe. This patient was seen in room TR05C/TR05C and the patient's care was started at 4:11 PM.   Chief Complaint  Patient presents with  . Fall   The history is provided by the patient. No language interpreter was used.   HPI Comments: [name] is a 32 y.o. female who presents to the Emergency Department complaining of left lower lip and right wrist pain after falling 7 days ago. Pt reports she fell off a dresser while trying to kill a spider and hit her left lower lip and right wrist. Her wrist pain worsens with ROM. She has been taking tylenol with no relief. She has been putting Vaseline on her lip. She denies neck pain, hitting her head, and LOC.  Past Medical History  Diagnosis Date  . Asthma   . Diabetes mellitus   . Mental disorder   . Depression   . Anxiety   . Hypertension   . Hyperemesis arising during pregnancy    Past Surgical History  Procedure Laterality Date  . No past surgeries    . Tubal ligation  06/20/2011    Procedure: POST PARTUM TUBAL LIGATION;  Surgeon: Mora Bellman, MD;  Location: [facility];  Service: Gynecology;  Laterality: Bilateral;  Bilateral post partum tubal ligation   Family History  Problem Relation Age of Onset  . Heart disease Mother     CHF  . Anesthesia problems Neg Hx   . Diabetes Sister    History  Substance Use Topics  . Smoking status: Never Smoker   . Smokeless tobacco: Never Used  . Alcohol Use: No     Comment: occassionally   OB History   Grav Para Term Preterm Abortions TAB SAB Ect Mult Living   4 3 2 1 1  0 0 1 0 3     Review of Systems  All other systems reviewed and are negative.   ROS: No TIA's or unusual headaches, no dysphagia.  No prolonged cough. No dyspnea or chest pain on exertion.  No abdominal pain, change in bowel  habits, black or bloody stools.  No urinary tract symptoms. .  Normal menses, no abnormal vaginal bleeding, discharge or unexpected pelvic pain. No new breast lumps, breast pain or nipple discharge.  Allergies  Shellfish allergy and Morphine and related  Home Medications   Current Outpatient Rx  Name  Route  Sig  Dispense  Refill  . acetaminophen (TYLENOL) 325 MG tablet   Oral   Take 650 mg by mouth every 6 (six) hours as needed for pain.         . cephALEXin (KEFLEX) 500 MG capsule   Oral   Take 1 capsule (500 mg total) by mouth 4 (four) times daily.   20 capsule   0   . HYDROcodone-acetaminophen (NORCO/VICODIN) 5-325 MG per tablet   Oral   Take 1 tablet by mouth every 4 (four) hours as needed for pain.   10 tablet   0    BP 133/70  Pulse 97  Temp(Src) 98.6 F (37 C) (Oral)  Resp 18  Wt 203 lb 11.2 oz (92.398 kg)  BMI 31.9 kg/m2  SpO2 100% Physical Exam  Nursing note and vitals reviewed. Constitutional: She is oriented to person, place, and time. She appears well-developed and well-nourished. No distress.  HENT:  Head: Normocephalic and atraumatic.  Mouth/Throat:    Eyes: EOM are normal.  Neck: Neck supple. No tracheal deviation present.  Cardiovascular: Normal rate.   Pulmonary/Chest: Effort normal. No respiratory distress.  Musculoskeletal:       Right wrist: She exhibits decreased range of motion, tenderness and bony tenderness. She exhibits no swelling, no effusion, no crepitus, no deformity and no laceration.       Arms: Neurological: She is alert and oriented to person, place, and time.  Skin: Skin is warm and dry.  Psychiatric: She has a normal mood and affect. Her behavior is normal.    ED Course  Procedures (including critical care time) Medications  cephALEXin (KEFLEX) capsule 500 mg (not administered)    DIAGNOSTIC STUDIES: Oxygen Saturation is 100% on RA, normal by my interpretation.    COORDINATION OF CARE: 4:57 PM- Discussed treatment  plan with pt which includes application of right wrist splint and discharge home with antibiotics and pain meds. Pt agrees to plan.    Labs Review Labs Reviewed - No data to display Imaging Review Dg Wrist Complete Right  10/24/2012   CLINICAL DATA:  [place]. Pain.  EXAM: RIGHT WRIST - COMPLETE 3+ VIEW  COMPARISON:  None.  FINDINGS: There is no evidence of fracture or dislocation. There is no evidence of arthropathy or other focal bone abnormality. Soft tissues are unremarkable.  IMPRESSION: Negative.   Electronically Signed   By: Curlene Dolphin M.D.   On: 10/24/2012 16:28    MDM   1. Fall (on)(from) incline, initial encounter   2. Lip injury, initial encounter   3. Wrist sprain and strain, left, initial encounter    Rx: 1. Keflex 2. Vicodin 3. Wrist Splint 4. F/u with PCP  32 y.o.[name]'s evaluation in the Emergency Department is complete. It has been determined that no acute conditions requiring further emergency intervention are present at this time. The patient/guardian have been advised of the diagnosis and plan. We have discussed signs and symptoms that warrant return to the ED, such as changes or worsening in symptoms.  Vital signs are stable at discharge. Filed Vitals:   10/24/12 1553  BP: 133/70  Pulse: 97  Temp: 98.6 F (37 C)  Resp: 18    Patient/guardian has voiced understanding and agreed to follow-up with the PCP or specialist.  I personally performed the services described in this documentation, which was scribed in my presence. The recorded information has been reviewed and is accurate.    Linus Mako, PA-C 10/24/12 1710

## 2012-10-27 NOTE — ED Provider Notes (Signed)
Medical screening examination/treatment/procedure(s) were performed by non-physician practitioner and as supervising physician I was immediately available for consultation/collaboration.  Jasper Riling. Alvino Chapel, MD 10/27/12 1016

## 2013-06-23 ENCOUNTER — Emergency Department (HOSPITAL_COMMUNITY)
Admission: EM | Admit: 2013-06-23 | Discharge: 2013-06-23 | Disposition: A | Discharge status: Home / Self Care | Payer: Self-pay | Attending: Emergency Medicine | Admitting: Emergency Medicine

## 2013-06-23 ENCOUNTER — Emergency Department (HOSPITAL_COMMUNITY): Payer: Self-pay

## 2013-06-23 ENCOUNTER — Encounter (HOSPITAL_COMMUNITY): Payer: Self-pay | Admitting: Emergency Medicine

## 2013-06-23 DIAGNOSIS — Z3202 Encounter for pregnancy test, result negative: Secondary | ICD-10-CM | POA: Insufficient documentation

## 2013-06-23 DIAGNOSIS — R5381 Other malaise: Secondary | ICD-10-CM | POA: Insufficient documentation

## 2013-06-23 DIAGNOSIS — R112 Nausea with vomiting, unspecified: Secondary | ICD-10-CM | POA: Insufficient documentation

## 2013-06-23 DIAGNOSIS — R42 Dizziness and giddiness: Secondary | ICD-10-CM | POA: Insufficient documentation

## 2013-06-23 DIAGNOSIS — R197 Diarrhea, unspecified: Secondary | ICD-10-CM | POA: Insufficient documentation

## 2013-06-23 DIAGNOSIS — K089 Disorder of teeth and supporting structures, unspecified: Secondary | ICD-10-CM | POA: Insufficient documentation

## 2013-06-23 DIAGNOSIS — R5383 Other fatigue: Secondary | ICD-10-CM

## 2013-06-23 DIAGNOSIS — M25569 Pain in unspecified knee: Secondary | ICD-10-CM | POA: Insufficient documentation

## 2013-06-23 DIAGNOSIS — E119 Type 2 diabetes mellitus without complications: Secondary | ICD-10-CM | POA: Insufficient documentation

## 2013-06-23 DIAGNOSIS — M549 Dorsalgia, unspecified: Secondary | ICD-10-CM | POA: Insufficient documentation

## 2013-06-23 DIAGNOSIS — K0889 Other specified disorders of teeth and supporting structures: Secondary | ICD-10-CM

## 2013-06-23 DIAGNOSIS — I10 Essential (primary) hypertension: Secondary | ICD-10-CM | POA: Insufficient documentation

## 2013-06-23 DIAGNOSIS — M25561 Pain in right knee: Secondary | ICD-10-CM

## 2013-06-23 DIAGNOSIS — J45909 Unspecified asthma, uncomplicated: Secondary | ICD-10-CM | POA: Insufficient documentation

## 2013-06-23 DIAGNOSIS — Z8659 Personal history of other mental and behavioral disorders: Secondary | ICD-10-CM | POA: Insufficient documentation

## 2013-06-23 LAB — COMPREHENSIVE METABOLIC PANEL
ALBUMIN: 3 g/dL — AB (ref 3.5–5.2)
ALK PHOS: 95 U/L (ref 39–117)
ALT: 13 U/L (ref 0–35)
AST: 17 U/L (ref 0–37)
BUN: 22 mg/dL (ref 6–23)
CO2: 19 mEq/L (ref 19–32)
Calcium: 8.4 mg/dL (ref 8.4–10.5)
Chloride: 101 mEq/L (ref 96–112)
Creatinine, Ser: 1.37 mg/dL — ABNORMAL HIGH (ref 0.50–1.10)
GFR calc Af Amer: 57 mL/min — ABNORMAL LOW (ref >90)
GFR calc non Af Amer: 49 mL/min — ABNORMAL LOW (ref >90)
Glucose, Bld: 166 mg/dL — ABNORMAL HIGH (ref 70–99)
POTASSIUM: 4.2 meq/L (ref 3.7–5.3)
Sodium: 134 mEq/L — ABNORMAL LOW (ref 137–147)
TOTAL PROTEIN: 7.9 g/dL (ref 6.0–8.3)
Total Bilirubin: 0.3 mg/dL (ref 0.3–1.2)

## 2013-06-23 LAB — URINALYSIS, ROUTINE W REFLEX MICROSCOPIC
BILIRUBIN URINE: NEGATIVE
GLUCOSE, UA: NEGATIVE mg/dL
HGB URINE DIPSTICK: NEGATIVE
KETONES UR: NEGATIVE mg/dL
Leukocytes, UA: NEGATIVE
Nitrite: NEGATIVE
PH: 5.5 (ref 5.0–8.0)
Protein, ur: 100 mg/dL — AB
Specific Gravity, Urine: 1.024 (ref 1.005–1.030)
Urobilinogen, UA: 1 mg/dL (ref 0.0–1.0)

## 2013-06-23 LAB — CBC WITH DIFFERENTIAL/PLATELET
Basophils Absolute: 0 10*3/uL (ref 0.0–0.1)
Basophils Relative: 0 % (ref 0–1)
EOS PCT: 1 % (ref 0–5)
Eosinophils Absolute: 0.1 10*3/uL (ref 0.0–0.7)
HEMATOCRIT: 33.8 % — AB (ref 36.0–46.0)
Hemoglobin: 9.9 g/dL — ABNORMAL LOW (ref 12.0–15.0)
LYMPHS ABS: 0.6 10*3/uL — AB (ref 0.7–4.0)
Lymphocytes Relative: 11 % — ABNORMAL LOW (ref 12–46)
MCH: 22.3 pg — ABNORMAL LOW (ref 26.0–34.0)
MCHC: 29.3 g/dL — ABNORMAL LOW (ref 30.0–36.0)
MCV: 76.1 fL — AB (ref 78.0–100.0)
MONOS PCT: 6 % (ref 3–12)
Monocytes Absolute: 0.3 10*3/uL (ref 0.1–1.0)
NEUTROS ABS: 4.4 10*3/uL (ref 1.7–7.7)
Neutrophils Relative %: 82 % — ABNORMAL HIGH (ref 43–77)
Platelets: 189 10*3/uL (ref 150–400)
RBC: 4.44 MIL/uL (ref 3.87–5.11)
RDW: 15.6 % — AB (ref 11.5–15.5)
WBC: 5.4 10*3/uL (ref 4.0–10.5)

## 2013-06-23 LAB — LIPASE, BLOOD: Lipase: 34 U/L (ref 11–59)

## 2013-06-23 LAB — POC URINE PREG, ED: Preg Test, Ur: NEGATIVE

## 2013-06-23 LAB — URINE MICROSCOPIC-ADD ON

## 2013-06-23 MED ORDER — ONDANSETRON 4 MG PO TBDP
8.0000 mg | ORAL_TABLET | Freq: Once | ORAL | Status: AC
  Administered 2013-06-23: 8 mg via ORAL
  Filled 2013-06-23: qty 2

## 2013-06-23 MED ORDER — SODIUM CHLORIDE 0.9 % IV BOLUS (SEPSIS)
1000.0000 mL | Freq: Once | INTRAVENOUS | Status: AC
  Administered 2013-06-23: 1000 mL via INTRAVENOUS

## 2013-06-23 MED ORDER — PROMETHAZINE HCL 25 MG PO TABS: 25.0000 mg | ORAL_TABLET | Freq: Four times a day (QID) | ORAL | Status: DC | PRN

## 2013-06-23 MED ORDER — OXYCODONE-ACETAMINOPHEN 5-325 MG PO TABS
1.0000 | ORAL_TABLET | Freq: Once | ORAL | Status: AC
  Administered 2013-06-23: 1 via ORAL
  Filled 2013-06-23: qty 1

## 2013-06-23 MED ORDER — AMOXICILLIN 500 MG PO CAPS: 500.0000 mg | ORAL_CAPSULE | Freq: Three times a day (TID) | ORAL | Status: DC

## 2013-06-23 MED ORDER — HYDROCODONE-ACETAMINOPHEN 5-325 MG PO TABS: 1.0000 | ORAL_TABLET | Freq: Four times a day (QID) | ORAL | Status: DC | PRN

## 2013-06-23 NOTE — ED Notes (Signed)
She states "i feel lightheaded and ive been vomiting and my knee hurts and my tooth hurts." she is a&ox4, resp e/u

## 2013-06-23 NOTE — ED Provider Notes (Signed)
Medical screening examination/treatment/procedure(s) were conducted as a shared visit with non-physician practitioner(s) and myself.  I personally evaluated the patient during the encounter.   Pt c/o 2-3 episodes nv, also c/o right toothache. No facial swelling. No trismus. abd soft nt. Labs.   Mirna Mires, MD 06/23/13 [phone]

## 2013-06-23 NOTE — ED Notes (Signed)
Attempted IV access x  2 and unsuccessful

## 2013-06-23 NOTE — ED Notes (Signed)
IV team notified of need for IV access. 

## 2013-06-23 NOTE — ED Provider Notes (Signed)
CSN: [id number]     Arrival date & time 06/23/13  0945 History   First MD Initiated Contact with Patient 06/23/13 1021     Chief Complaint  Patient presents with  . Emesis  . Dizziness  . Dental Pain  . Knee Pain     (Consider location/radiation/quality/duration/timing/severity/associated sxs/prior Treatment) HPI [name] is a 33 y.o. female who presents to ED with multiple complaints. Pt reports she has had nausea, vomiting, dental pain since yesterday. States right knee pain for 3 days. Diarrhea that started last night. States had more than 10 episodes of watery diarrhea. States unable to keep anything down.  Denies fever, chills, malaise. Denies abdominal pain. Did not try taking any medications. Denies any known knee injuries. States knee pain is worsened with movement and walking. Keeping it still makes pain better.   Past Medical History  Diagnosis Date  . Asthma   . Diabetes mellitus   . Mental disorder   . Depression   . Anxiety   . Hypertension   . Hyperemesis arising during pregnancy    Past Surgical History  Procedure Laterality Date  . No past surgeries    . Tubal ligation  06/20/2011    Procedure: POST PARTUM TUBAL LIGATION;  Surgeon: Mora Bellman, MD;  Location: [facility];  Service: Gynecology;  Laterality: Bilateral;  Bilateral post partum tubal ligation   Family History  Problem Relation Age of Onset  . Heart disease Mother     CHF  . Anesthesia problems Neg Hx   . Diabetes Sister    History  Substance Use Topics  . Smoking status: Never Smoker   . Smokeless tobacco: Never Used  . Alcohol Use: No     Comment: occassionally   OB History   Grav Para Term Preterm Abortions TAB SAB Ect Mult Living   4 3 2 1 1  0 0 1 0 3     Review of Systems  Constitutional: Negative for fever and chills.  HENT: Positive for dental problem. Negative for congestion and sore throat.   Respiratory: Negative for cough, chest tightness and shortness of breath.    Cardiovascular: Negative for chest pain, palpitations and leg swelling.  Gastrointestinal: Positive for nausea, vomiting and diarrhea. Negative for abdominal pain.  Genitourinary: Negative for dysuria, flank pain, vaginal bleeding, vaginal discharge, vaginal pain and pelvic pain.  Musculoskeletal: Positive for arthralgias and back pain. Negative for neck pain and neck stiffness.  Skin: Negative for rash.  Neurological: Positive for weakness. Negative for dizziness and headaches.  All other systems reviewed and are negative.     Allergies  Shellfish allergy and Morphine and related  Home Medications   Prior to Admission medications   Medication Sig Start Date End Date Taking? Authorizing Provider  naproxen sodium (ANAPROX) 220 MG tablet Take 220 mg by mouth every 6 (six) hours as needed (pain).   Yes Historical Provider, MD   BP 115/71  Pulse 90  Temp(Src) 98.7 F (37.1 C) (Oral)  Resp 18  Ht 5\' 7"  (1.702 m)  Wt 215 lb (97.523 kg)  BMI 33.67 kg/m2  SpO2 100%  LMP 05/28/2013 Physical Exam  Nursing note and vitals reviewed. Constitutional: She appears well-developed and well-nourished. No distress.  HENT:  Head: Normocephalic.  Right lower 1st molar broken off to the gum line with central decay. Tender to palpation. No surrounding gum swelling or obvious abscess.  Eyes: Conjunctivae are normal.  Neck: Neck supple.  Cardiovascular: Normal rate, regular rhythm and normal  heart sounds.   Pulmonary/Chest: Effort normal and breath sounds normal. No respiratory distress. She has no wheezes. She has no rales.  Abdominal: Soft. Bowel sounds are normal. She exhibits no distension. There is no tenderness. There is no rebound.  Musculoskeletal: She exhibits no edema.  Normal appearing right knee. Tender to palpation over medial joint. Full ROM of the knee joint. Pain with full flexion and extension. Negative anterior and posterior drawer signs. No laxity or pain with medial or lateral  stress.    Lymphadenopathy:    She has no cervical adenopathy.  Neurological: She is alert.  Skin: Skin is warm and dry.  Psychiatric: She has a normal mood and affect. Her behavior is normal.    ED Course  Procedures (including critical care time) Labs Review Labs Reviewed  COMPREHENSIVE METABOLIC PANEL - Abnormal; Notable for the following:    Sodium 134 (*)    Glucose, Bld 166 (*)    Creatinine, Ser 1.37 (*)    Albumin 3.0 (*)    GFR calc non Af Amer 49 (*)    GFR calc Af Amer 57 (*)    All other components within normal limits  CBC WITH DIFFERENTIAL - Abnormal; Notable for the following:    Hemoglobin 9.9 (*)    HCT 33.8 (*)    MCV 76.1 (*)    MCH 22.3 (*)    MCHC 29.3 (*)    RDW 15.6 (*)    Neutrophils Relative % 82 (*)    Lymphocytes Relative 11 (*)    Lymphs Abs 0.6 (*)    All other components within normal limits  URINALYSIS, ROUTINE W REFLEX MICROSCOPIC - Abnormal; Notable for the following:    APPearance CLOUDY (*)    Protein, ur 100 (*)    All other components within normal limits  URINE MICROSCOPIC-ADD ON - Abnormal; Notable for the following:    Squamous Epithelial / LPF FEW (*)    All other components within normal limits  LIPASE, BLOOD  POC URINE PREG, ED    Imaging Review Dg Knee Complete 4 Views Right  06/23/2013   CLINICAL DATA:  Right knee pain.  EXAM: RIGHT KNEE - COMPLETE 4+ VIEW  COMPARISON:  No priors.  FINDINGS: Four views of the right knee demonstrate a prominent enthesophyte off the inferior pole of the patella. There is a lucency through this which could represent a fracture through the enthesophyte. Overlying soft tissues appear slightly swollen. No other findings to suggest acute fracture, subluxation or dislocation are noted.  IMPRESSION: 1. Possible fracture through enthesophyte extending off the lower pole of the patella. Alternatively, this may simply represent dystrophic calcifications within the proximal aspect of the patellar tendon.  Clinical correlation for point tenderness in this region is recommended.   Electronically Signed   By: Vinnie Langton M.D.   On: 06/23/2013 13:42     EKG Interpretation None      MDM   Final diagnoses:  Nausea vomiting and diarrhea  Right knee pain  Toothache    Pt with nausea, vomiting diarrhea since yesterday. Also complaining of a dental pain and right knee pain. Exam unremarkable. Pt is non toxic appearing.   N/v/d: Labs reassuring. Pt feels better after IV fluids, 8mg  of zofran ODT, percocet PO. Pt was able to tolerate PO fluids and even asked for food. sandwich given and she ate it with no problems.   Dental pain: tooth broken, no obvious abscess. Pt is afebrile, no facial swelling. Will  treat with amoxil for possible infection. Follow up with a dentist.   Knee pain: non traumatic. Full ROM. Tenderness over medial joint. X-ray obtained, showed possible fracture through enthesophyte off the lower pole of patella. Pt has no tenderness over patella. I do not think this is a true fracture. Home with follow up.   At this time, pt stable for d/c home, follow up with pcp for reevaluation if symptoms continue. VS normal.   Filed Vitals:   06/23/13 0955 06/23/13 1000 06/23/13 1130 06/23/13 1200  BP: 115/71 115/67 127/90 131/65  Pulse: 90     Temp: 98.7 F (37.1 C)     TempSrc: Oral     Resp: 18 18    Height: 5\' 7"  (1.702 m)     Weight: 215 lb (97.523 kg)     SpO2: 100%          [name] A Carron Jaggi, PA-C 06/23/13 1550

## 2013-06-23 NOTE — Discharge Instructions (Signed)
Amoxil for tooth ache, for possible abscess. Norco for pain as prescribed. Phenergan for nausea and vomiting. Drink plenty of fluids. You can take imodium for diarrhea. Follow up with your doctor.   Viral Gastroenteritis Viral gastroenteritis is also known as stomach flu. This condition affects the stomach and intestinal tract. It can cause sudden diarrhea and vomiting. The illness typically lasts 3 to 8 days. Most people develop an immune response that eventually gets rid of the virus. While this natural response develops, the virus can make you quite ill. CAUSES  Many different viruses can cause gastroenteritis, such as rotavirus or noroviruses. You can catch one of these viruses by consuming contaminated food or water. You may also catch a virus by sharing utensils or other personal items with an infected person or by touching a contaminated surface. SYMPTOMS  The most common symptoms are diarrhea and vomiting. These problems can cause a severe loss of body fluids (dehydration) and a body salt (electrolyte) imbalance. Other symptoms may include:  Fever.  Headache.  Fatigue.  Abdominal pain. DIAGNOSIS  Your caregiver can usually diagnose viral gastroenteritis based on your symptoms and a physical exam. A stool sample may also be taken to test for the presence of viruses or other infections. TREATMENT  This illness typically goes away on its own. Treatments are aimed at rehydration. The most serious cases of viral gastroenteritis involve vomiting so severely that you are not able to keep fluids down. In these cases, fluids must be given through an intravenous line (IV). HOME CARE INSTRUCTIONS   Drink enough fluids to keep your urine clear or pale yellow. Drink small amounts of fluids frequently and increase the amounts as tolerated.  Ask your caregiver for specific rehydration instructions.  Avoid:  Foods high in sugar.  Alcohol.  Carbonated drinks.  Tobacco.  Juice.  Caffeine  drinks.  Extremely hot or cold fluids.  Fatty, greasy foods.  Too much intake of anything at one time.  Dairy products until 24 to 48 hours after diarrhea stops.  You may consume probiotics. Probiotics are active cultures of beneficial bacteria. They may lessen the amount and number of diarrheal stools in adults. Probiotics can be found in yogurt with active cultures and in supplements.  Wash your hands well to avoid spreading the virus.  Only take over-the-counter or prescription medicines for pain, discomfort, or fever as directed by your caregiver. Do not give aspirin to children. Antidiarrheal medicines are not recommended.  Ask your caregiver if you should continue to take your regular prescribed and over-the-counter medicines.  Keep all follow-up appointments as directed by your caregiver. SEEK IMMEDIATE MEDICAL CARE IF:   You are unable to keep fluids down.  You do not urinate at least once every 6 to 8 hours.  You develop shortness of breath.  You notice blood in your stool or vomit. This may look like coffee grounds.  You have abdominal pain that increases or is concentrated in one small area (localized).  You have persistent vomiting or diarrhea.  You have a fever.  The patient is a child younger than 3 months, and he or she has a fever.  The patient is a child older than 3 months, and he or she has a fever and persistent symptoms.  The patient is a child older than 3 months, and he or she has a fever and symptoms suddenly get worse.  The patient is a baby, and he or she has no tears when crying. MAKE SURE YOU:  Understand these instructions.  Will watch your condition.  Will get help right away if you are not doing well or get worse. Document Released: 01/04/2005 Document Revised: 03/29/2011 Document Reviewed: 10/21/2010 [facility] Patient Information 2014 [place]. Knee Pain Knee pain can be a result of an injury or other medical conditions.  Treatment will depend on the cause of your pain. HOME CARE  Only take medicine as told by your doctor.  Keep a healthy weight. Being overweight can make the knee hurt more.  Stretch before exercising or playing sports.  If there is constant knee pain, change the way you exercise. Ask your doctor for advice.  Make sure shoes fit well. Choose the right shoe for the sport or activity.  Protect your knees. Wear kneepads if needed.  Rest when you are tired. GET HELP RIGHT AWAY IF:   Your knee pain does not stop.  Your knee pain does not get better.  Your knee joint feels hot to the touch.  You have a fever. MAKE SURE YOU:   Understand these instructions.  Will watch this condition.  Will get help right away if you are not doing well or get worse. Document Released: 04/02/2008 Document Revised: 03/29/2011 Document Reviewed: 04/02/2008 [facility] Patient Information 2014 [place], [place].

## 2013-07-08 IMAGING — CR DG FEMUR 2+V*R*
1 series · 1 of 1 positions shown · non-contrast
Comparison: None

CLINICAL DATA: Trauma.  Pain.

RIGHT FEMUR - 2 VIEW

[view not recorded]
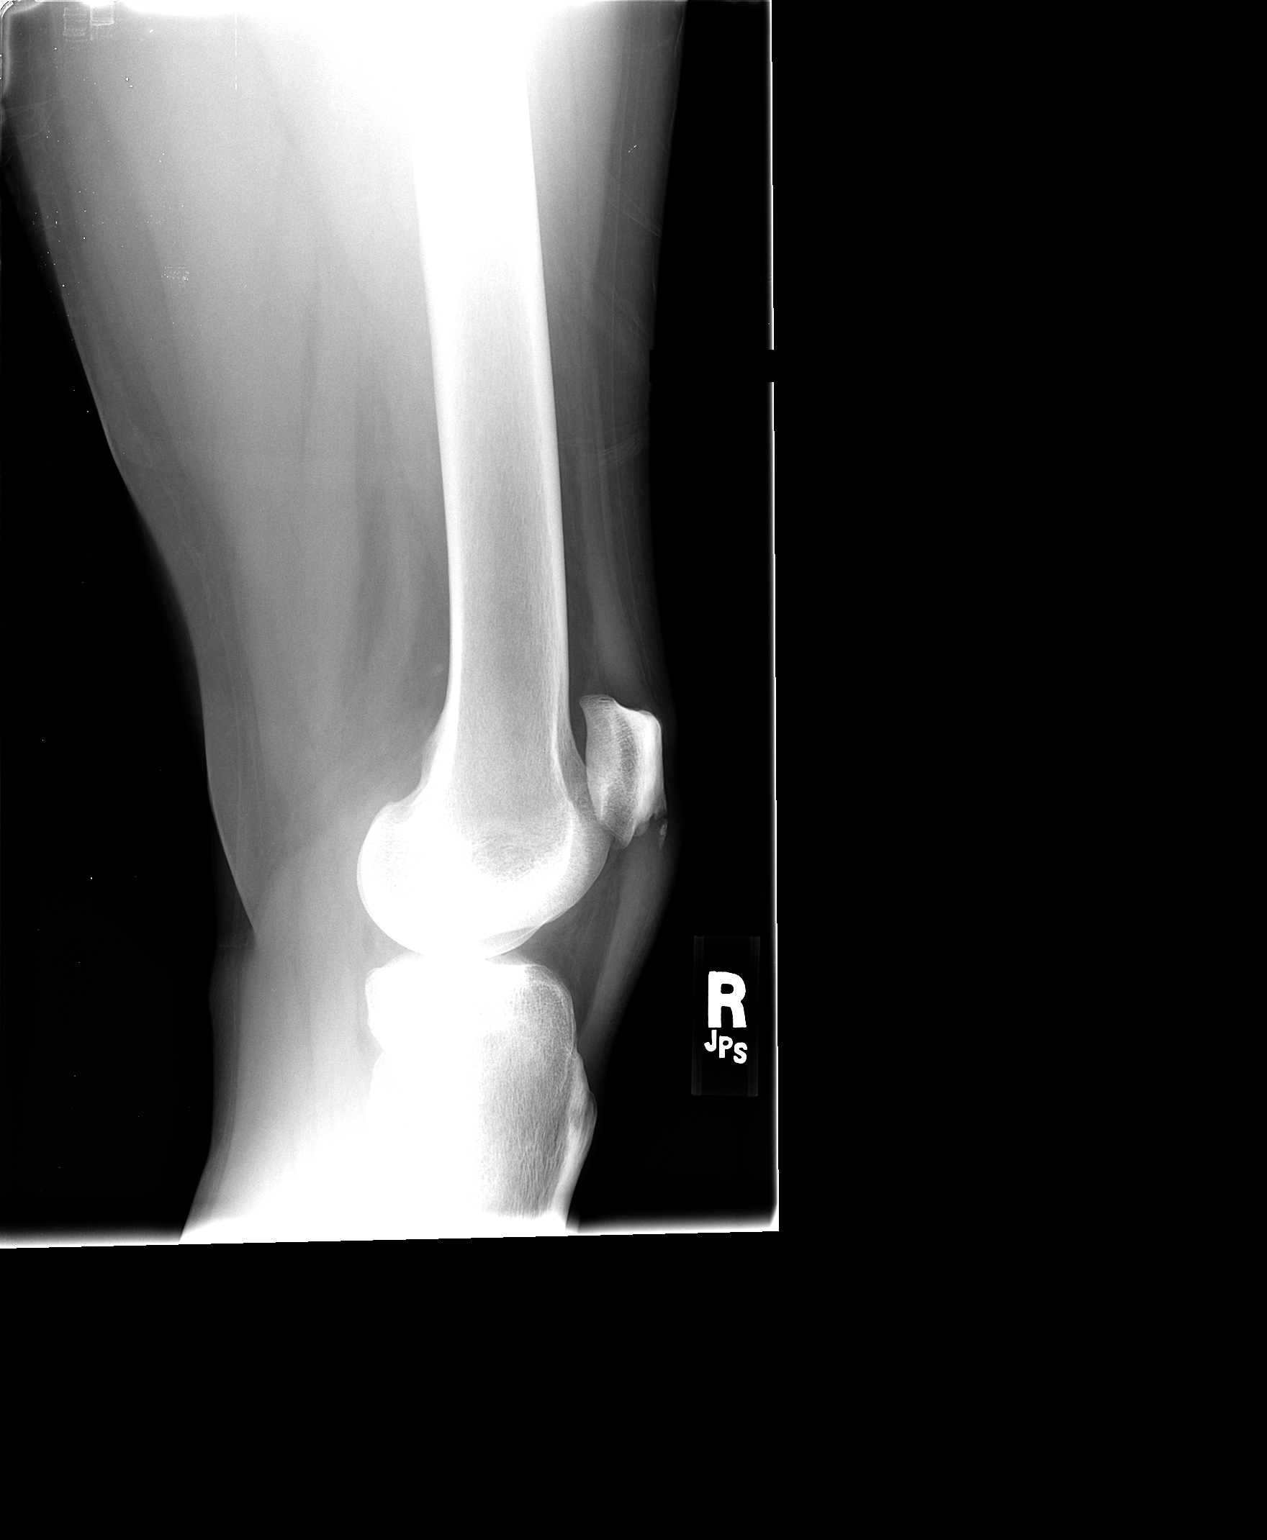

[1 of 1 positions shown; findings below may reference images not displayed]

FINDINGS: No evidence of fracture, dislocation or focal lesion.
IMPRESSION: Negative radiographs

## 2013-08-11 ENCOUNTER — Emergency Department (HOSPITAL_COMMUNITY)
Admission: EM | Admit: 2013-08-11 | Discharge: 2013-08-11 | Disposition: A | Discharge status: Home / Self Care | Payer: Self-pay | Attending: Emergency Medicine | Admitting: Emergency Medicine

## 2013-08-11 ENCOUNTER — Encounter (HOSPITAL_COMMUNITY): Payer: Self-pay | Admitting: Emergency Medicine

## 2013-08-11 ENCOUNTER — Emergency Department (HOSPITAL_COMMUNITY): Payer: Self-pay

## 2013-08-11 DIAGNOSIS — I10 Essential (primary) hypertension: Secondary | ICD-10-CM | POA: Insufficient documentation

## 2013-08-11 DIAGNOSIS — E119 Type 2 diabetes mellitus without complications: Secondary | ICD-10-CM | POA: Insufficient documentation

## 2013-08-11 DIAGNOSIS — J45909 Unspecified asthma, uncomplicated: Secondary | ICD-10-CM | POA: Insufficient documentation

## 2013-08-11 DIAGNOSIS — Z8659 Personal history of other mental and behavioral disorders: Secondary | ICD-10-CM | POA: Insufficient documentation

## 2013-08-11 DIAGNOSIS — R51 Headache: Secondary | ICD-10-CM | POA: Insufficient documentation

## 2013-08-11 DIAGNOSIS — N289 Disorder of kidney and ureter, unspecified: Secondary | ICD-10-CM | POA: Insufficient documentation

## 2013-08-11 DIAGNOSIS — R519 Headache, unspecified: Secondary | ICD-10-CM

## 2013-08-11 LAB — CBC WITH DIFFERENTIAL/PLATELET
Basophils Absolute: 0 K/uL (ref 0.0–0.1)
Basophils Relative: 0 % (ref 0–1)
Eosinophils Absolute: 0.1 K/uL (ref 0.0–0.7)
Eosinophils Relative: 2 % (ref 0–5)
HCT: 31.5 % — ABNORMAL LOW (ref 36.0–46.0)
Hemoglobin: 9.9 g/dL — ABNORMAL LOW (ref 12.0–15.0)
Lymphocytes Relative: 36 % (ref 12–46)
Lymphs Abs: 2.7 K/uL (ref 0.7–4.0)
MCH: 23.8 pg — ABNORMAL LOW (ref 26.0–34.0)
MCHC: 31.4 g/dL (ref 30.0–36.0)
MCV: 75.7 fL — ABNORMAL LOW (ref 78.0–100.0)
Monocytes Absolute: 0.4 K/uL (ref 0.1–1.0)
Monocytes Relative: 6 % (ref 3–12)
Neutro Abs: 4.2 K/uL (ref 1.7–7.7)
Neutrophils Relative %: 56 % (ref 43–77)
Platelets: 242 K/uL (ref 150–400)
RBC: 4.16 MIL/uL (ref 3.87–5.11)
RDW: 15.3 % (ref 11.5–15.5)
WBC: 7.4 K/uL (ref 4.0–10.5)

## 2013-08-11 LAB — URINE MICROSCOPIC-ADD ON

## 2013-08-11 LAB — COMPREHENSIVE METABOLIC PANEL
ALT: 11 U/L (ref 0–35)
ANION GAP: 13 (ref 5–15)
AST: 14 U/L (ref 0–37)
Albumin: 3.3 g/dL — ABNORMAL LOW (ref 3.5–5.2)
Alkaline Phosphatase: 85 U/L (ref 39–117)
BUN: 26 mg/dL — ABNORMAL HIGH (ref 6–23)
CALCIUM: 9 mg/dL (ref 8.4–10.5)
CO2: 23 meq/L (ref 19–32)
CREATININE: 1.45 mg/dL — AB (ref 0.50–1.10)
Chloride: 100 mEq/L (ref 96–112)
GFR, EST AFRICAN AMERICAN: 53 mL/min — AB (ref >90)
GFR, EST NON AFRICAN AMERICAN: 46 mL/min — AB (ref >90)
GLUCOSE: 187 mg/dL — AB (ref 70–99)
Potassium: 4.6 mEq/L (ref 3.7–5.3)
Sodium: 136 mEq/L — ABNORMAL LOW (ref 137–147)
Total Bilirubin: <0.2 mg/dL — ABNORMAL LOW (ref 0.3–1.2)
Total Protein: 7.9 g/dL (ref 6.0–8.3)

## 2013-08-11 LAB — URINALYSIS, ROUTINE W REFLEX MICROSCOPIC
Bilirubin Urine: NEGATIVE
GLUCOSE, UA: 250 mg/dL — AB
Hgb urine dipstick: NEGATIVE
KETONES UR: NEGATIVE mg/dL
Leukocytes, UA: NEGATIVE
Nitrite: NEGATIVE
Protein, ur: 100 mg/dL — AB
Specific Gravity, Urine: 1.022 (ref 1.005–1.030)
Urobilinogen, UA: 1 mg/dL (ref 0.0–1.0)
pH: 6 (ref 5.0–8.0)

## 2013-08-11 LAB — RAPID URINE DRUG SCREEN, HOSP PERFORMED
Amphetamines: NOT DETECTED
Barbiturates: NOT DETECTED
Benzodiazepines: NOT DETECTED
Cocaine: NOT DETECTED
Opiates: NOT DETECTED
Tetrahydrocannabinol: POSITIVE — AB

## 2013-08-11 LAB — LIPASE, BLOOD: Lipase: 40 U/L (ref 11–59)

## 2013-08-11 MED ORDER — PROCHLORPERAZINE EDISYLATE 5 MG/ML IJ SOLN
10.0000 mg | Freq: Four times a day (QID) | INTRAMUSCULAR | Status: DC | PRN
  Administered 2013-08-11: 10 mg via INTRAVENOUS
  Filled 2013-08-11: qty 2

## 2013-08-11 MED ORDER — DIPHENHYDRAMINE HCL 50 MG/ML IJ SOLN
12.5000 mg | Freq: Once | INTRAMUSCULAR | Status: AC
  Administered 2013-08-11: 12.5 mg via INTRAVENOUS
  Filled 2013-08-11: qty 1

## 2013-08-11 MED ORDER — HYDROCODONE-ACETAMINOPHEN 5-325 MG PO TABS: 2.0000 | ORAL_TABLET | ORAL | Status: DC | PRN

## 2013-08-11 MED ORDER — PROMETHAZINE HCL 25 MG/ML IJ SOLN: 25.0000 mg | Freq: Four times a day (QID) | INTRAMUSCULAR | Status: DC | PRN

## 2013-08-11 MED ORDER — LISINOPRIL 10 MG PO TABS: 10.0000 mg | ORAL_TABLET | Freq: Every day | ORAL | Status: DC

## 2013-08-11 NOTE — ED Provider Notes (Signed)
CSN: [id number]     Arrival date & time 08/11/13  1026 History   First MD Initiated Contact with Patient 08/11/13 1123     Chief Complaint  Patient presents with  . Dizziness  . Headache  . Wrist Pain     (Consider location/radiation/quality/duration/timing/severity/associated sxs/prior Treatment) HPI Patient presents with complaints of headache for the last several weeks associated with some dizziness.  Has been taking up to 12 Aleve per day.  Has past history of diabetes and depression.  Was told she had hypertension during pregnancy but states she's not on any medication right now.  Denies any lateralizing weakness.  Denies speech difficulty.  Denies fever chills nausea vomiting. Past Medical History  Diagnosis Date  . Asthma   . Diabetes mellitus   . Mental disorder   . Depression   . Anxiety   . Hypertension   . Hyperemesis arising during pregnancy    Past Surgical History  Procedure Laterality Date  . No past surgeries    . Tubal ligation  06/20/2011    Procedure: POST PARTUM TUBAL LIGATION;  Surgeon: Mora Bellman, MD;  Location: [facility];  Service: Gynecology;  Laterality: Bilateral;  Bilateral post partum tubal ligation   Family History  Problem Relation Age of Onset  . Heart disease Mother     CHF  . Anesthesia problems Neg Hx   . Diabetes Sister    History  Substance Use Topics  . Smoking status: Never Smoker   . Smokeless tobacco: Never Used  . Alcohol Use: No     Comment: occassionally   OB History   Grav Para Term Preterm Abortions TAB SAB Ect Mult Living   4 3 2 1 1  0 0 1 0 3     Review of Systems  All other systems reviewed and are negative  Allergies  Shellfish allergy and Morphine and related  Home Medications   Prior to Admission medications   Medication Sig Start Date End Date Taking? Authorizing Provider  HYDROcodone-acetaminophen (NORCO/VICODIN) 5-325 MG per tablet Take 2 tablets by mouth every 4 (four) hours as needed. 08/11/13   Dot Lanes, MD  lisinopril (PRINIVIL,ZESTRIL) 10 MG tablet Take 1 tablet (10 mg total) by mouth daily. 08/11/13   Dot Lanes, MD   BP 134/77  Pulse 81  Temp(Src) 98.5 F (36.9 C) (Oral)  Resp 14  Ht 5\' 6"  (1.676 m)  Wt 222 lb (100.699 kg)  BMI 35.85 kg/m2  SpO2 100%  LMP 07/28/2013 Physical Exam  Physical Exam  Nursing note and vitals reviewed. Constitutional: She is oriented to person, place, and time. She appears well-developed and well-nourished. No distress.  HENT:  Head: Normocephalic and atraumatic.  Eyes: Pupils are equal, round, and reactive to light.  Neck: Normal range of motion.  Cardiovascular: Normal rate and intact distal pulses.   Pulmonary/Chest: No respiratory distress.  Abdominal: Normal appearance. She exhibits no distension.  Musculoskeletal: Normal range of motion.  Neurological: She is alert and oriented to person, place, and time. No cranial nerve deficit.  no focal or lateralizing neurologic findings.  No cerebellar findings of past pointing or truncal ataxia Skin: Skin is warm and dry. No rash noted.  Psychiatric: She has a normal mood and affect. Her behavior is normal.   ED Course  Procedures (including critical care time) Medications  diphenhydrAMINE (BENADRYL) injection 12.5 mg (12.5 mg Intravenous Given 08/11/13 1406)    Labs Review Labs Reviewed  URINALYSIS, ROUTINE W REFLEX MICROSCOPIC -  Abnormal; Notable for the following:    APPearance CLOUDY (*)    Glucose, UA 250 (*)    Protein, ur 100 (*)    All other components within normal limits  URINE RAPID DRUG SCREEN (HOSP PERFORMED) - Abnormal; Notable for the following:    Tetrahydrocannabinol POSITIVE (*)    All other components within normal limits  COMPREHENSIVE METABOLIC PANEL - Abnormal; Notable for the following:    Sodium 136 (*)    Glucose, Bld 187 (*)    BUN 26 (*)    Creatinine, Ser 1.45 (*)    Albumin 3.3 (*)    Total Bilirubin <0.2 (*)    GFR calc non Af Amer 46 (*)    GFR  calc Af Amer 53 (*)    All other components within normal limits  CBC WITH DIFFERENTIAL - Abnormal; Notable for the following:    Hemoglobin 9.9 (*)    HCT 31.5 (*)    MCV 75.7 (*)    MCH 23.8 (*)    All other components within normal limits  URINE MICROSCOPIC-ADD ON - Abnormal; Notable for the following:    Squamous Epithelial / LPF MANY (*)    All other components within normal limits  LIPASE, BLOOD    Imaging Review Ct Head Wo Contrast  08/11/2013   CLINICAL DATA:  Headache.  EXAM: CT HEAD WITHOUT CONTRAST  TECHNIQUE: Contiguous axial images were obtained from the base of the skull through the vertex without intravenous contrast.  COMPARISON:  Head CT 02/19/2012.  FINDINGS: No acute intracranial abnormalities. Specifically, no evidence of acute intracranial hemorrhage, no definite findings of acute/subacute cerebral ischemia, no mass, mass effect, hydrocephalus or abnormal intra or extra-axial fluid collections. Visualized paranasal sinuses and mastoids are well pneumatized. No acute displaced skull fractures are identified.  IMPRESSION: *No acute intracranial abnormalities. *The appearance of the brain is normal.   Electronically Signed   By: Vinnie Langton M.D.   On: 08/11/2013 13:25    After treatment in the ED the patient feels back to baseline and wants to go home.  Patient was instructed not to take NSAIDs for headache and started on blood pressure medicine.  She was given a list and number for contacting an outpatient family physician.  She was encouraged to followup with family physician for evaluation of her renal function which I discussed with her is beginning to rise and is concerning for long-term renal insufficiency or long-term renal failure.  MDM   Final diagnoses:  Essential hypertension  Renal insufficiency  Nonintractable headache, unspecified chronicity pattern, unspecified headache type        Dot Lanes, MD 08/12/13 [phone]

## 2013-08-11 NOTE — ED Notes (Signed)
Patient transported to CT 

## 2013-08-11 NOTE — ED Notes (Signed)
Attempted iv x2 22g rt hand without success each unable to thread and flush

## 2013-08-11 NOTE — ED Notes (Signed)
Pt. Stated, I've had a headache with dizziness for about a eeek and I also hve rt. Wrist pain and fingers for about 3 days.

## 2013-08-11 NOTE — ED Notes (Signed)
ermd informed of pts request for pain medication

## 2013-08-11 NOTE — Discharge Instructions (Signed)
Hypertension Hypertension is another name for high blood pressure. High blood pressure forces your heart to work harder to pump blood. A blood pressure reading has two numbers, which includes a higher number over a lower number (example: 110/72). HOME CARE   Have your blood pressure rechecked by your doctor.  Only take medicine as told by your doctor. Follow the directions carefully. The medicine does not work as well if you skip doses. Skipping doses also puts you at risk for problems.  Do not smoke.  Monitor your blood pressure at home as told by your doctor. GET HELP IF:  You think you are having a reaction to the medicine you are taking.  You have repeat headaches or feel dizzy.  You have puffiness (swelling) in your ankles.  You have trouble with your vision. GET HELP RIGHT AWAY IF:   You get a very bad headache and are confused.  You feel weak, numb, or faint.  You get chest or belly (abdominal) pain.  You throw up (vomit).  You cannot breathe very well. MAKE SURE YOU:   Understand these instructions.  Will watch your condition.  Will get help right away if you are not doing well or get worse. Document Released: 06/23/2007 Document Revised: 01/09/2013 Document Reviewed: 10/27/2012 [facility] Patient Information 2015 [place], [place]. This information is not intended to replace advice given to you by your health care provider. Make sure you discuss any questions you have with your health care provider.    Emergency Department Resource Guide 1) Find a Doctor and Pay Out of Pocket Although you won't have to find out who is covered by your insurance plan, it is a good idea to ask around and get recommendations. You will then need to call the office and see if the doctor you have chosen will accept you as a new patient and what types of options they offer for patients who are self-pay. Some doctors offer discounts or will set up payment plans for their patients who do not  have insurance, but you will need to ask so you aren't surprised when you get to your appointment.  2) Contact Your Local Health Department Not all health departments have doctors that can see patients for sick visits, but many do, so it is worth a call to see if yours does. If you don't know where your local health department is, you can check in your phone book. The CDC also has a tool to help you locate your state's health department, and many state websites also have listings of all of their local health departments.  3) Find a [facility] If your illness is not likely to be very severe or complicated, you may want to try a walk in clinic. These are popping up all over the country in pharmacies, drugstores, and shopping centers. They're usually staffed by nurse practitioners or physician assistants that have been trained to treat common illnesses and complaints. They're usually fairly quick and inexpensive. However, if you have serious medical issues or chronic medical problems, these are probably not your best option.  No Primary Care Doctor: - Call Health Connect at  [phone] - they can help you locate a primary care doctor that  accepts your insurance, provides certain services, etc. - Physician Referral Service- [phone]  Chronic Pain Problems: Organization         Address  Phone   Notes  [facility]  [phone] Patients need to be referred by their primary  care doctor.   Medication Assistance: Organization         Address  Phone   Notes  [facility] Medication [facility] [address] [place], [place] [address] [phone] --Must be a resident of [facility] -- Must have NO insurance coverage whatsoever (no Medicaid/ Medicare, etc.) -- The pt. MUST have a primary care doctor that directs their care regularly and follows them in the community   [facility]  [phone]   [employer]  [phone]    Agencies that  provide inexpensive medical care: Organization         Address  Phone   Notes  [place]  [phone]   [facility]    [phone]   [facility] [place], [place] [address] [phone]   [place] [address], [place] [phone]   Planned Parenthood    [phone]   [facility]    [phone]   [place] and [place] [address], [place] Phone:  [phone], Fax:  [phone] Hours of Operation:  9 am - 6 pm, M-F.  Also accepts Medicaid/Medicare and self-pay.  [facility] for [place], [address], [place] Phone: [phone], Fax: [phone]. Hours of Operation:  8:30 am - 5:30 pm, M-F.  Also accepts Medicaid and self-pay.  [facility] [place] [address] [place] Phone: [phone]   [place], [place], [place] [phone], Ext. [phone] Mondays & Thursdays: 7-9 AM.  First 15 patients are seen on a first come, first serve basis.    [place] Providers:  Organization         Address  Phone   Notes  [facility] [address], [place] [phone] Also accepts self-pay patients.  [facility] [id number] [place], [place]  [phone]   [place], [address], [place] [phone]   [facility] [address], [place] [phone]   Lucianne Lei [address], [place]   [phone] Only accepts [place] patients after they have their name applied to their card.   Self-Pay (no insurance) in [facility]:  Organization         Address  Phone   Notes  Sickle Cell Patients, [facility] [place] [phone]   [facility]  Urgent [facility] [place] [phone]   [facility] [place]  [place], [place], [place] [phone]   [facility]/Dr. Osei-Bonsu  [address] [place] or [address], [place] [phone] Phone number for both [place] and [place] locations is the same.  [facility] [address] [place] [phone]   [facility] [address], [place] or [address] [phone]   [facility] [address] [place] [phone], phone; [phone], fax Sees patients 1st and 3rd Saturday of every month.  Must not qualify for public or private insurance (i.e. Medicaid, Medicare, [place] Health Choice, Veterans' Benefits)  Household income should be no more than 200% of  the poverty level The clinic cannot treat you if you are pregnant or think you are pregnant  Sexually transmitted diseases are not treated at the clinic.    Dental Care: Organization         Address  Phone  Notes  [facility] Department of [facility] [place] [phone] Accepts children up to age 75 who are enrolled in [place] or [place]; pregnant women with a Medicaid card; and children who have applied for Medicaid or [place] Health Choice, but were declined, whose parents can pay a reduced fee at time of service.  [facility] Department of [facility]  [address], [place] [phone] Accepts children up to age 40 who are enrolled in [place] or [place]; pregnant women with a Medicaid card; and children who have applied for Medicaid or [place] Health Choice, but were declined, whose parents can pay a reduced fee at time of service.  [place] [facility] Access PROGRAM  [place] [phone] Patients are seen by appointment only. Walk-ins  are not accepted. [place] will see patients 78 years of age and older. Monday - Tuesday (8am-5pm) Most Wednesdays (8:30-5pm) $30 per visit, cash only  [facility] Adult [facility] Access PRO[facility]  [address], [facility] [phone] Patients are seen by appointment only. Walk-ins are not accepted. [place] will see patients 33 years of age and older. One Wednesday Evening (Monthly: Volunteer Based).  $30 per visit, cash only  [place]  [phone] for adults; Children under age 108, call Graduate Pediatric Dentistry at [phone]. Children aged 31-14, please call [phone] to request a pediatric application.  Dental services are provided in all areas of dental care including fillings, crowns and bridges, complete and partial dentures, implants, gum treatment, root canals, and extractions. Preventive care is also provided. Treatment is provided to both adults and children. Patients are selected via a lottery and there is often a waiting list.   [facility] [address] [place]  [phone] [email or web address]   [facility] [address] [place], [place] [phone], Ext. [phone] Second and Fourth Thursday of each month, opens at 6:30 AM; Clinic ends at 9 AM.  Patients are seen on a first-come first-served basis, and a limited number are seen during each clinic.   [facility]  [address] [place], [place] [phone]   Eligibility Requirements You must have lived in [place], [place], or [place] counties for at least the last three months.   You cannot be eligible for state or federal sponsored [employer], including [employer], [place], or [employer].   You generally cannot be eligible for healthcare insurance through your employer.    How to apply: Eligibility screenings are held every Tuesday and Wednesday afternoon from 1:00 pm until 4:00 pm. You do not need an appointment  for the interview!  [facility] [address] [place], [place]   [place]  [place]  [phone]

## 2013-11-19 ENCOUNTER — Encounter (HOSPITAL_COMMUNITY): Payer: Self-pay | Admitting: Emergency Medicine

## 2014-10-11 ENCOUNTER — Emergency Department (HOSPITAL_COMMUNITY)
Admission: EM | Admit: 2014-10-11 | Discharge: 2014-10-11 | Disposition: A | Discharge status: Home / Self Care | Payer: Self-pay | Attending: Emergency Medicine | Admitting: Emergency Medicine

## 2014-10-11 ENCOUNTER — Encounter (HOSPITAL_COMMUNITY): Payer: Self-pay | Admitting: *Deleted

## 2014-10-11 ENCOUNTER — Emergency Department (HOSPITAL_COMMUNITY): Payer: Self-pay

## 2014-10-11 DIAGNOSIS — Z79899 Other long term (current) drug therapy: Secondary | ICD-10-CM | POA: Insufficient documentation

## 2014-10-11 DIAGNOSIS — E119 Type 2 diabetes mellitus without complications: Secondary | ICD-10-CM | POA: Insufficient documentation

## 2014-10-11 DIAGNOSIS — J4521 Mild intermittent asthma with (acute) exacerbation: Secondary | ICD-10-CM | POA: Insufficient documentation

## 2014-10-11 DIAGNOSIS — Z8659 Personal history of other mental and behavioral disorders: Secondary | ICD-10-CM | POA: Insufficient documentation

## 2014-10-11 DIAGNOSIS — R6889 Other general symptoms and signs: Secondary | ICD-10-CM

## 2014-10-11 DIAGNOSIS — I10 Essential (primary) hypertension: Secondary | ICD-10-CM | POA: Insufficient documentation

## 2014-10-11 MED ORDER — AZITHROMYCIN 250 MG PO TABS: 250.0000 mg | ORAL_TABLET | Freq: Every day | ORAL | Status: DC

## 2014-10-11 MED ORDER — PREDNISONE 20 MG PO TABS: ORAL_TABLET | ORAL | Status: DC

## 2014-10-11 MED ORDER — BENZONATATE 100 MG PO CAPS: 100.0000 mg | ORAL_CAPSULE | Freq: Three times a day (TID) | ORAL | Status: DC

## 2014-10-11 NOTE — Discharge Instructions (Signed)

## 2014-10-11 NOTE — ED Notes (Signed)
Pt reports cough and generalized body aches for 3 weeks. Pt states that she has been taking Nyquil. Denies any meds today.

## 2014-10-11 NOTE — ED Provider Notes (Signed)
CSN: [id number]     Arrival date & time 10/11/14  1159 History  This chart was scribed for Domenic Moras, PA-C, working with Debby Freiberg, MD by Starleen Arms, ED Scribe. This patient was seen in room TR04C/TR04C and the patient's care was started at 1:18 PM.   Chief Complaint  Patient presents with  . Cough  . Generalized Body Aches   The history is provided by the patient. No language interpreter was used.    HPI Comments: [name] is a 34 y.o. female with hx of asthma who presents to the Emergency Department complaining of cough productive of yellow sputum onset 3 weeks ago.  Associated symptoms include generalized body aches (3 weeks) and a mild sore throat (initial symptom; resolved).  Patient reports increased use of her albuterol inhaler with some relief as well as use of Nyquill and Theraflu without relief. Patient does not smoke.  She denies CP, hemoptysis.  No prior history of PE or DVT, no recent surgery, prolonged bed rest, unilateral leg swelling or calf pain, or active cancer. She does have history of asthma has been using her inhaler more than usual. States that she lost count of her usage. She denies any prior history of intubation and ICU stay secondary to her asthma. She has no other complaint.    Past Medical History  Diagnosis Date  . Asthma   . Diabetes mellitus   . Mental disorder   . Depression   . Anxiety   . Hypertension   . Hyperemesis arising during pregnancy    Past Surgical History  Procedure Laterality Date  . No past surgeries    . Tubal ligation  06/20/2011    Procedure: POST PARTUM TUBAL LIGATION;  Surgeon: Mora Bellman, MD;  Location: [facility];  Service: Gynecology;  Laterality: Bilateral;  Bilateral post partum tubal ligation   Family History  Problem Relation Age of Onset  . Heart disease Mother     CHF  . Anesthesia problems Neg Hx   . Diabetes Sister    Social History  Substance Use Topics  . Smoking status: Never Smoker   . Smokeless  tobacco: Never Used  . Alcohol Use: No     Comment: occassionally   OB History    Gravida Para Term Preterm AB TAB SAB Ectopic Multiple Living   4 3 2 1 1  0 0 1 0 3     Review of Systems  Constitutional: Negative for fever.  HENT: Positive for sore throat.   Respiratory: Positive for cough.   Musculoskeletal: Positive for myalgias.      Allergies  Shellfish allergy and Morphine and related  Home Medications   Prior to Admission medications   Medication Sig Start Date End Date Taking? Authorizing Yanelis Osika  HYDROcodone-acetaminophen (NORCO/VICODIN) 5-325 MG per tablet Take 2 tablets by mouth every 4 (four) hours as needed. 08/11/13   Leonard Schwartz, MD  lisinopril (PRINIVIL,ZESTRIL) 10 MG tablet Take 1 tablet (10 mg total) by mouth daily. 08/11/13   Leonard Schwartz, MD   BP 148/96 mmHg  Pulse 84  Temp(Src) 99.2 F (37.3 C) (Oral)  Resp 18  SpO2 100%  LMP 10/02/2014 Physical Exam  Constitutional: She is oriented to person, place, and time. She appears well-developed and well-nourished. No distress.  HENT:  Head: Normocephalic and atraumatic.  Right Ear: External ear normal.  Left Ear: External ear normal.  Mouth/Throat: Oropharynx is clear and moist.  Eyes: Conjunctivae and EOM are normal.  Neck: Normal range of  motion. Neck supple. No tracheal deviation present.  Cardiovascular: Normal rate and intact distal pulses.   Pulmonary/Chest: Effort normal. No respiratory distress. She has wheezes (Faint expiratory wheezes without rales, rhonchi).  Abdominal: Soft. There is no tenderness.  Musculoskeletal: Normal range of motion. She exhibits no edema or tenderness.  Neurological: She is alert and oriented to person, place, and time.  Skin: Skin is warm and dry.  Psychiatric: She has a normal mood and affect. Her behavior is normal.  Nursing note and vitals reviewed.   ED Course  Procedures (including critical care time)  DIAGNOSTIC STUDIES: Oxygen Saturation is 100% on RA,  normal by my interpretation.    COORDINATION OF CARE:  1:23 PM   Patient here with 3 weeks of cough and generalized body aches. Worsening asthma exacerbation due to it. No hypoxia identified during our ER visit. Her chest x-ray shows no signs of pneumonia. Given the duration of her symptoms, patient will be discharge with steroid, cough medication, and a course of Z-Pak for coverage of possible superimposed infection or atypical pneumonia.   Labs Review Labs Reviewed - No data to display  Imaging Review Dg Chest 2 View  10/11/2014   CLINICAL DATA:  Productive cough for 3 weeks. Whole body aches, low-grade fever.  EXAM: CHEST  2 VIEW  COMPARISON:  03/22/2012  FINDINGS: The heart size and mediastinal contours are within normal limits. Both lungs are clear. The visualized skeletal structures are unremarkable.  IMPRESSION: Normal study.   Electronically Signed   By: Rolm Baptise M.D.   On: 10/11/2014 13:53   I have personally reviewed and evaluated these images and lab results as part of my medical decision-making.   EKG Interpretation None      MDM   Final diagnoses:  Flu-like symptoms  Asthma exacerbation attacks, mild intermittent    BP 148/96 mmHg  Pulse 84  Temp(Src) 99.2 F (37.3 C) (Oral)  Resp 18  SpO2 100%  LMP 10/02/2014   I personally performed the services described in this documentation, which was scribed in my presence. The recorded information has been reviewed and is accurate.     Domenic Moras, PA-C 10/11/14 1412  Debby Freiberg, MD 10/12/14 1150

## 2016-02-06 ENCOUNTER — Encounter (HOSPITAL_COMMUNITY): Payer: Self-pay

## 2016-02-06 ENCOUNTER — Emergency Department (HOSPITAL_COMMUNITY): Payer: Self-pay

## 2016-02-06 ENCOUNTER — Emergency Department (HOSPITAL_COMMUNITY)
Admission: EM | Admit: 2016-02-06 | Discharge: 2016-02-06 | Disposition: A | Discharge status: Home / Self Care | Payer: Self-pay | Attending: Physician Assistant | Admitting: Physician Assistant

## 2016-02-06 DIAGNOSIS — B349 Viral infection, unspecified: Secondary | ICD-10-CM | POA: Insufficient documentation

## 2016-02-06 DIAGNOSIS — E119 Type 2 diabetes mellitus without complications: Secondary | ICD-10-CM | POA: Insufficient documentation

## 2016-02-06 DIAGNOSIS — Z79899 Other long term (current) drug therapy: Secondary | ICD-10-CM | POA: Insufficient documentation

## 2016-02-06 DIAGNOSIS — I10 Essential (primary) hypertension: Secondary | ICD-10-CM | POA: Insufficient documentation

## 2016-02-06 DIAGNOSIS — J45909 Unspecified asthma, uncomplicated: Secondary | ICD-10-CM | POA: Insufficient documentation

## 2016-02-06 MED ORDER — IBUPROFEN 800 MG PO TABS
800.0000 mg | ORAL_TABLET | Freq: Once | ORAL | Status: AC
  Administered 2016-02-06: 800 mg via ORAL
  Filled 2016-02-06: qty 1

## 2016-02-06 MED ORDER — IBUPROFEN 800 MG PO TABS: 800.0000 mg | ORAL_TABLET | Freq: Three times a day (TID) | ORAL | 0 refills | Status: DC

## 2016-02-06 MED ORDER — GUAIFENESIN 100 MG/5ML PO LIQD: 100.0000 mg | ORAL | 0 refills | Status: DC | PRN

## 2016-02-06 MED ORDER — BENZONATATE 100 MG PO CAPS: 100.0000 mg | ORAL_CAPSULE | Freq: Three times a day (TID) | ORAL | 0 refills | Status: DC | PRN

## 2016-02-06 NOTE — Discharge Instructions (Signed)
You were seen today and have symptoms of the flu. Please stay out of public places until you feel better. These take ibuprofen Tylenol and rest and drink plenty of fluids. Please return as needed.

## 2016-02-06 NOTE — ED Notes (Signed)
Pt. gioven a warm blanket . Pt. Denies nausea at the present time

## 2016-02-06 NOTE — ED Notes (Signed)
Pt to xray

## 2016-02-06 NOTE — ED Triage Notes (Addendum)
Pt. Having flu like symptoms since yesterday. Sore throat, cough congestion chills body aches.  Pt. Felt feverish.  N/v  Warm blanket given and mask given to pt.

## 2016-02-06 NOTE — ED Provider Notes (Signed)
[facility] DEPT Provider Note   CSN: [id number] Arrival date & time: 02/06/16  1147     History   Chief Complaint Chief Complaint  Patient presents with  . Influenza    HPI [name] is a 36 y.o. female.  HPI   Patient is a 36 year old female presenting with myalgias, fever, fatigue. Patient has like symptoms. Started 2 days ago.  Past Medical History:  Diagnosis Date  . Anxiety   . Asthma   . Depression   . Diabetes mellitus   . Hyperemesis arising during pregnancy   . Hypertension   . Mental disorder     Patient Active Problem List   Diagnosis Date Noted  . 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 Back pain 07/28/2011  . H. pylori infection 11/24/2010  . Depression 11/20/2010  . Asthma 11/20/2010  . Chronic hypertension in pregnancy 11/08/2010  . Type 2 diabetes mellitus ([place]) 11/07/2010    Past Surgical History:  Procedure Laterality Date  . NO PAST SURGERIES    . TUBAL LIGATION  06/20/2011   Procedure: POST PARTUM TUBAL LIGATION;  Surgeon: Mora Bellman, MD;  Location: [facility];  Service: Gynecology;  Laterality: Bilateral;  Bilateral post partum tubal ligation    OB History    Gravida Para Term Preterm AB Living   4 3 2 1 1 3    SAB TAB Ectopic Multiple Live Births   0 0 1 0 3       Home Medications    Prior to Admission medications   Medication Sig Start Date End Date Taking? Authorizing Provider  azithromycin (ZITHROMAX) 250 MG tablet Take 1 tablet (250 mg total) by mouth daily. 10/11/14   Domenic Moras, PA-C  benzonatate (TESSALON PERLES) 100 MG capsule Take 1 capsule (100 mg total) by mouth 3 (three) times daily as needed for cough. 02/06/16   Estill Llerena Lyn Neno [place], MD  benzonatate (TESSALON) 100 MG capsule Take 1 capsule (100 mg total) by mouth every 8 (eight) hours. 10/11/14   Domenic Moras, PA-C  guaiFENesin (ROBITUSSIN) 100 MG/5ML liquid Take 5-10 mLs (100-200 mg total) by mouth every 4 (four) hours as needed for cough. 02/06/16   Verlinda Slotnick Lyn  Manasvi Dickard, MD  HYDROcodone-acetaminophen (NORCO/VICODIN) 5-325 MG per tablet Take 2 tablets by mouth every 4 (four) hours as needed. 08/11/13   Leonard Schwartz, MD  ibuprofen (ADVIL,MOTRIN) 800 MG tablet Take 1 tablet (800 mg total) by mouth 3 (three) times daily. 02/06/16   Detria Cummings Lyn Kainalu Heggs, MD  lisinopril (PRINIVIL,ZESTRIL) 10 MG tablet Take 1 tablet (10 mg total) by mouth daily. 08/11/13   Leonard Schwartz, MD  predniSONE (DELTASONE) 20 MG tablet 2 tabs po daily x 4 days 10/11/14   Domenic Moras, PA-C    Family History Family History  Problem Relation Age of Onset  . Heart disease Mother     CHF  . Diabetes Sister   . Anesthesia problems Neg Hx     Social History Social History  Substance Use Topics  . Smoking status: Never Smoker  . Smokeless tobacco: Never Used  . Alcohol use No     Comment: occassionally     Allergies   Shellfish allergy and Morphine and related   Review of Systems Review of Systems  Constitutional: Positive for fatigue. Negative for activity change and appetite change.  HENT: Positive for congestion and sore throat. Negative for drooling.   Eyes: Negative for discharge.  Respiratory: Positive for cough. Negative for chest tightness.   Cardiovascular: Negative for  chest pain.  Gastrointestinal: Negative for abdominal distention.  Skin: Negative for rash.  All other systems reviewed and are negative.    Physical Exam Updated Vital Signs BP 145/77 (BP Location: Right Arm)   Pulse 94   Temp 98.5 F (36.9 C)   Resp 16   Ht 5\' 7"  (1.702 m)   LMP 01/06/2016   SpO2 100%   Physical Exam  Constitutional: She is oriented to person, place, and time. She appears well-developed and well-nourished.  HENT:  Head: Normocephalic and atraumatic.  Eyes: Right eye exhibits no discharge.  Cardiovascular: Normal rate, regular rhythm and normal heart sounds.   No murmur heard. Pulmonary/Chest: Effort normal and breath sounds normal. She has no wheezes. She has no  rales.  Abdominal: Soft. She exhibits no distension. There is no tenderness.  Neurological: She is oriented to person, place, and time.  Skin: Skin is warm and dry. She is not diaphoretic.  Psychiatric: She has a normal mood and affect.  Nursing note and vitals reviewed.    ED Treatments / Results  Labs (all labs ordered are listed, but only abnormal results are displayed) Labs Reviewed - No data to display  EKG  EKG Interpretation None       Radiology Dg Chest 2 View  Result Date: 02/06/2016 CLINICAL DATA:  Pt has been having chest pain for 2 days. Fever last night. EXAM: CHEST  2 VIEW COMPARISON:  Chest x-ray dated 10/11/2014 FINDINGS: The heart size and mediastinal contours are within normal limits. Both lungs are clear. The visualized skeletal structures are unremarkable. IMPRESSION: No active cardiopulmonary disease. No evidence of pneumonia or pulmonary edema. Electronically Signed   By: Franki Cabot M.D.   On: 02/06/2016 15:01    Procedures Procedures (including critical care time)  Medications Ordered in ED Medications  ibuprofen (ADVIL,MOTRIN) tablet 800 mg (800 mg Oral Given 02/06/16 1404)     Initial Impression / Assessment and Plan / ED Course  I have reviewed the triage vital signs and the nursing notes.  Pertinent labs & imaging results that were available during my care of the patient were reviewed by me and considered in my medical decision making (see chart for details).     Patient is a 36 year old female percent with like symptoms. Patient had symptoms for 2 days. I think this is likely flu. We'll give her ibuprofen, encourage fluids. Get chest x-ray to make sure she does not have pneumonia. Plan to discharge home with symptomatic care.  Final Clinical Impressions(s) / ED Diagnoses   Final diagnoses:  Viral syndrome    New Prescriptions Discharge Medication List as of 02/06/2016  3:12 PM    START taking these medications   Details  !!  benzonatate (TESSALON PERLES) 100 MG capsule Take 1 capsule (100 mg total) by mouth 3 (three) times daily as needed for cough., Starting Fri 02/06/2016, Print    guaiFENesin (ROBITUSSIN) 100 MG/5ML liquid Take 5-10 mLs (100-200 mg total) by mouth every 4 (four) hours as needed for cough., Starting Fri 02/06/2016, Print    ibuprofen (ADVIL,MOTRIN) 800 MG tablet Take 1 tablet (800 mg total) by mouth 3 (three) times daily., Starting Fri 02/06/2016, Print     !! - Potential duplicate medications found. Please discuss with provider.       Meliana Canner Julio Alm, MD 02/06/16 1542

## 2016-02-14 ENCOUNTER — Encounter (HOSPITAL_COMMUNITY): Payer: Self-pay | Admitting: Emergency Medicine

## 2016-02-14 ENCOUNTER — Inpatient Hospital Stay (HOSPITAL_COMMUNITY)
Admission: EM | Admit: 2016-02-14 | Discharge: 2016-02-18 | DRG: 871 | Disposition: A | Discharge status: Home / Self Care | Payer: Self-pay | Attending: Family Medicine | Admitting: Family Medicine

## 2016-02-14 ENCOUNTER — Emergency Department (HOSPITAL_COMMUNITY): Payer: Self-pay

## 2016-02-14 DIAGNOSIS — Z885 Allergy status to narcotic agent status: Secondary | ICD-10-CM

## 2016-02-14 DIAGNOSIS — R05 Cough: Secondary | ICD-10-CM

## 2016-02-14 DIAGNOSIS — D509 Iron deficiency anemia, unspecified: Secondary | ICD-10-CM | POA: Diagnosis present

## 2016-02-14 DIAGNOSIS — R1084 Generalized abdominal pain: Secondary | ICD-10-CM

## 2016-02-14 DIAGNOSIS — J45909 Unspecified asthma, uncomplicated: Secondary | ICD-10-CM | POA: Diagnosis present

## 2016-02-14 DIAGNOSIS — R059 Cough, unspecified: Secondary | ICD-10-CM

## 2016-02-14 DIAGNOSIS — N183 Chronic kidney disease, stage 3 (moderate): Secondary | ICD-10-CM | POA: Diagnosis present

## 2016-02-14 DIAGNOSIS — Z8632 Personal history of gestational diabetes: Secondary | ICD-10-CM

## 2016-02-14 DIAGNOSIS — Z8249 Family history of ischemic heart disease and other diseases of the circulatory system: Secondary | ICD-10-CM

## 2016-02-14 DIAGNOSIS — N179 Acute kidney failure, unspecified: Secondary | ICD-10-CM | POA: Diagnosis present

## 2016-02-14 DIAGNOSIS — I10 Essential (primary) hypertension: Secondary | ICD-10-CM | POA: Diagnosis present

## 2016-02-14 DIAGNOSIS — A419 Sepsis, unspecified organism: Principal | ICD-10-CM | POA: Diagnosis present

## 2016-02-14 DIAGNOSIS — Z833 Family history of diabetes mellitus: Secondary | ICD-10-CM

## 2016-02-14 DIAGNOSIS — E119 Type 2 diabetes mellitus without complications: Secondary | ICD-10-CM | POA: Diagnosis present

## 2016-02-14 DIAGNOSIS — Z91013 Allergy to seafood: Secondary | ICD-10-CM

## 2016-02-14 DIAGNOSIS — J181 Lobar pneumonia, unspecified organism: Secondary | ICD-10-CM

## 2016-02-14 DIAGNOSIS — J189 Pneumonia, unspecified organism: Secondary | ICD-10-CM | POA: Diagnosis present

## 2016-02-14 LAB — CBC WITH DIFFERENTIAL/PLATELET
BASOS PCT: 0 %
Basophils Absolute: 0 10*3/uL (ref 0.0–0.1)
EOS ABS: 0 10*3/uL (ref 0.0–0.7)
Eosinophils Relative: 0 %
HEMATOCRIT: 28.4 % — AB (ref 36.0–46.0)
Hemoglobin: 8.9 g/dL — ABNORMAL LOW (ref 12.0–15.0)
LYMPHS ABS: 1.1 10*3/uL (ref 0.7–4.0)
Lymphocytes Relative: 7 %
MCH: 23.2 pg — ABNORMAL LOW (ref 26.0–34.0)
MCHC: 31.3 g/dL (ref 30.0–36.0)
MCV: 74 fL — ABNORMAL LOW (ref 78.0–100.0)
Monocytes Absolute: 1.3 10*3/uL — ABNORMAL HIGH (ref 0.1–1.0)
Monocytes Relative: 8 %
Neutro Abs: 13.3 10*3/uL — ABNORMAL HIGH (ref 1.7–7.7)
Neutrophils Relative %: 85 %
Platelets: 331 10*3/uL (ref 150–400)
RBC: 3.84 MIL/uL — ABNORMAL LOW (ref 3.87–5.11)
RDW: 15 % (ref 11.5–15.5)
WBC: 15.7 10*3/uL — ABNORMAL HIGH (ref 4.0–10.5)

## 2016-02-14 LAB — URINALYSIS, ROUTINE W REFLEX MICROSCOPIC
BILIRUBIN URINE: NEGATIVE
Glucose, UA: NEGATIVE mg/dL
KETONES UR: NEGATIVE mg/dL
Leukocytes, UA: NEGATIVE
NITRITE: NEGATIVE
PH: 7 (ref 5.0–8.0)
Protein, ur: >300 mg/dL — AB
Specific Gravity, Urine: 1.02 (ref 1.005–1.030)

## 2016-02-14 LAB — COMPREHENSIVE METABOLIC PANEL
ALBUMIN: 2.3 g/dL — AB (ref 3.5–5.0)
ALK PHOS: 68 U/L (ref 38–126)
ALT: 8 U/L — AB (ref 14–54)
AST: 16 U/L (ref 15–41)
Anion gap: 8 (ref 5–15)
BUN: 19 mg/dL (ref 6–20)
CALCIUM: 8.5 mg/dL — AB (ref 8.9–10.3)
CO2: 21 mmol/L — ABNORMAL LOW (ref 22–32)
CREATININE: 2.2 mg/dL — AB (ref 0.44–1.00)
Chloride: 102 mmol/L (ref 101–111)
GFR calc Af Amer: 32 mL/min — ABNORMAL LOW (ref >60)
GFR calc non Af Amer: 27 mL/min — ABNORMAL LOW (ref >60)
GLUCOSE: 161 mg/dL — AB (ref 65–99)
Potassium: 4.3 mmol/L (ref 3.5–5.1)
Sodium: 131 mmol/L — ABNORMAL LOW (ref 135–145)
TOTAL PROTEIN: 7.8 g/dL (ref 6.5–8.1)
Total Bilirubin: 0.7 mg/dL (ref 0.3–1.2)

## 2016-02-14 LAB — I-STAT BETA HCG BLOOD, ED (MC, WL, AP ONLY): I-stat hCG, quantitative: 13.5 m[IU]/mL — ABNORMAL HIGH (ref <5)

## 2016-02-14 LAB — I-STAT CG4 LACTIC ACID, ED: Lactic Acid, Venous: 1.23 mmol/L (ref 0.5–1.9)

## 2016-02-14 LAB — URINALYSIS, MICROSCOPIC (REFLEX)

## 2016-02-14 LAB — GLUCOSE, CAPILLARY: Glucose-Capillary: 195 mg/dL — ABNORMAL HIGH (ref 65–99)

## 2016-02-14 LAB — PREGNANCY, URINE: Preg Test, Ur: NEGATIVE

## 2016-02-14 MED ORDER — ACETAMINOPHEN 325 MG PO TABS
650.0000 mg | ORAL_TABLET | Freq: Once | ORAL | Status: AC
  Administered 2016-02-14: 650 mg via ORAL
  Filled 2016-02-14: qty 2

## 2016-02-14 MED ORDER — ONDANSETRON HCL 4 MG/2ML IJ SOLN
4.0000 mg | Freq: Once | INTRAMUSCULAR | Status: AC
  Administered 2016-02-14: 4 mg via INTRAVENOUS
  Filled 2016-02-14: qty 2

## 2016-02-14 MED ORDER — DEXTROSE 5 % IV SOLN
500.0000 mg | Freq: Once | INTRAVENOUS | Status: AC
  Administered 2016-02-14: 500 mg via INTRAVENOUS
  Filled 2016-02-14: qty 500

## 2016-02-14 MED ORDER — HYDROMORPHONE HCL 2 MG/ML IJ SOLN
0.5000 mg | INTRAMUSCULAR | Status: DC | PRN
  Administered 2016-02-14 – 2016-02-18 (×23): 0.5 mg via INTRAVENOUS
  Filled 2016-02-14 (×23): qty 1

## 2016-02-14 MED ORDER — SODIUM CHLORIDE 0.9 % IV BOLUS (SEPSIS)
500.0000 mL | Freq: Once | INTRAVENOUS | Status: AC
  Administered 2016-02-14: 500 mL via INTRAVENOUS

## 2016-02-14 MED ORDER — IOPAMIDOL (ISOVUE-300) INJECTION 61%
INTRAVENOUS | Status: AC
  Administered 2016-02-14: 100 mL
  Filled 2016-02-14: qty 100

## 2016-02-14 MED ORDER — KETOROLAC TROMETHAMINE 30 MG/ML IJ SOLN
30.0000 mg | Freq: Once | INTRAMUSCULAR | Status: AC
  Administered 2016-02-14: 30 mg via INTRAVENOUS
  Filled 2016-02-14: qty 1

## 2016-02-14 MED ORDER — SODIUM CHLORIDE 0.9 % IV BOLUS (SEPSIS)
1000.0000 mL | Freq: Once | INTRAVENOUS | Status: AC
  Administered 2016-02-14: 1000 mL via INTRAVENOUS

## 2016-02-14 MED ORDER — DEXTROSE 5 % IV SOLN
1.0000 g | Freq: Once | INTRAVENOUS | Status: AC
  Administered 2016-02-14: 1 g via INTRAVENOUS
  Filled 2016-02-14: qty 10

## 2016-02-14 MED ORDER — PROMETHAZINE HCL 25 MG/ML IJ SOLN
25.0000 mg | Freq: Once | INTRAMUSCULAR | Status: AC
  Administered 2016-02-14: 25 mg via INTRAVENOUS
  Filled 2016-02-14: qty 1

## 2016-02-14 MED ORDER — HYDROMORPHONE HCL 2 MG/ML IJ SOLN
0.5000 mg | Freq: Once | INTRAMUSCULAR | Status: AC
  Administered 2016-02-14: 0.5 mg via INTRAVENOUS
  Filled 2016-02-14: qty 1

## 2016-02-14 NOTE — ED Notes (Addendum)
Pt in CT.

## 2016-02-14 NOTE — ED Notes (Signed)
Pt out of room for testing. 

## 2016-02-14 NOTE — H&P (Signed)
History and Physical    [name] XTK:[id number] DOB: [birth year] DOA: 02/14/2016  PCP: Pcp Not In System   Patient coming from: HOME  Chief Complaint: Flu like symptoms.  HPI: [name] is a 36 y.o. woman with a history of HTN, DM, asthma, and anxiety/depression who presented to the ED for evaluation of two days of cough productive of yellow sputum.  She has also had nausea and vomiting.  She has had abdominal pain and diarrhea.  No hematemesis or BRBPR.  No chest pain or shortness of breath.  She reports that her stepson has had similar symptoms.    ED Course: Temp in the ED 102.4.  HR 132.  WBC count 15.7.  Hgb 8.9.  Na 131.  Cr 2.2 (Baseline 1.2-1.4).  Lactic acid level 1.23.  Flu screen negative.  CT of the abdomen and pelvis was ordered because of her complaints of nausea, vomiting, and diarrhea.  This study was negative for acute intra-abdominal pathology, but she was found to have a left lower lobe pneumonia.  She received 3.5L of NS in the ED and IV Rocephin and azithromycin for CAP.  Hospitalist asked to admit.  Review of Systems: As per HPI otherwise 10 point review of systems negative.    Past Medical History:  Diagnosis Date  . Anxiety   . Asthma   . Depression   . Diabetes mellitus   . Hyperemesis arising during pregnancy   . Hypertension   . Mental disorder     Past Surgical History:  Procedure Laterality Date  . NO PAST SURGERIES    . TUBAL LIGATION  06/20/2011   Procedure: POST PARTUM TUBAL LIGATION;  Surgeon: Mora Bellman, MD;  Location: [facility];  Service: Gynecology;  Laterality: Bilateral;  Bilateral post partum tubal ligation     reports that she has never smoked. She has never used smokeless tobacco. She reports that she does not drink alcohol or use drugs.  Allergies  Allergen Reactions  . Shellfish Allergy Anaphylaxis and Swelling  . Morphine And Related Nausea And Vomiting    Family History  Problem Relation Age of Onset  . Heart disease Mother      CHF  . Diabetes Sister   . Anesthesia problems Neg Hx      Prior to Admission medications   Medication Sig Start Date End Date Taking? Authorizing Provider  azithromycin (ZITHROMAX) 250 MG tablet Take 1 tablet (250 mg total) by mouth daily. Patient not taking: Reported on 02/14/2016 10/11/14   Domenic Moras, PA-C  benzonatate (TESSALON PERLES) 100 MG capsule Take 1 capsule (100 mg total) by mouth 3 (three) times daily as needed for cough. Patient not taking: Reported on 02/14/2016 02/06/16   Courteney Lyn Mackuen, MD  benzonatate (TESSALON) 100 MG capsule Take 1 capsule (100 mg total) by mouth every 8 (eight) hours. Patient not taking: Reported on 02/14/2016 10/11/14   Domenic Moras, PA-C  guaiFENesin (ROBITUSSIN) 100 MG/5ML liquid Take 5-10 mLs (100-200 mg total) by mouth every 4 (four) hours as needed for cough. Patient not taking: Reported on 02/14/2016 02/06/16   Courteney Lyn Mackuen, MD  HYDROcodone-acetaminophen (NORCO/VICODIN) 5-325 MG per tablet Take 2 tablets by mouth every 4 (four) hours as needed. Patient not taking: Reported on 02/14/2016 08/11/13   Leonard Schwartz, MD  ibuprofen (ADVIL,MOTRIN) 800 MG tablet Take 1 tablet (800 mg total) by mouth 3 (three) times daily. Patient not taking: Reported on 02/14/2016 02/06/16   Courteney Lyn Mackuen, MD  lisinopril (PRINIVIL,ZESTRIL) 10  MG tablet Take 1 tablet (10 mg total) by mouth daily. Patient not taking: Reported on 02/14/2016 08/11/13   Leonard Schwartz, MD  predniSONE (DELTASONE) 20 MG tablet 2 tabs po daily x 4 days Patient not taking: Reported on 02/14/2016 10/11/14   Domenic Moras, PA-C    Physical Exam: Vitals:   02/14/16 2145 02/14/16 2200 02/14/16 2215 02/14/16 2248  BP: 135/86 127/78 131/90 (!) 139/94  Pulse: 114 114 114 (!) 129  Resp: (!) 27 20 19 19   Temp:    98.5 F (36.9 C)  TempSrc:      SpO2: 100% 100% 100% 100%  Weight:      Height:          Constitutional: NAD, calm, comfortable, appears tired but she is not  decompensating. Vitals:   02/14/16 2145 02/14/16 2200 02/14/16 2215 02/14/16 2248  BP: 135/86 127/78 131/90 (!) 139/94  Pulse: 114 114 114 (!) 129  Resp: (!) 27 20 19 19   Temp:    98.5 F (36.9 C)  TempSrc:      SpO2: 100% 100% 100% 100%  Weight:      Height:       Eyes: PERRL, lids and conjunctivae normal ENMT: Mucous membranes are dry. Posterior pharynx clear of any exudate or lesions. Normal dentition.  Neck: normal appearance, supple Respiratory: clear to auscultation listening anteriorly, No wheezing, no crackles. Normal respiratory effort. No accessory muscle use.  Cardiovascular: Tachycardic but regular.  No murmurs / rubs / gallops. No extremity edema. 2+ pedal pulses.  GI: abdomen is soft and compressible.  No distention.  No tenderness.  Bowel sounds are present. Musculoskeletal:  No joint deformity in upper and lower extremities. Good ROM, no contractures. Normal muscle tone.  Skin: no rashes, warm and dry Neurologic: No focal deficits. Psychiatric: Normal judgment and insight. Alert and oriented x 3. Normal mood.     Labs on Admission: I have personally reviewed following labs and imaging studies  CBC:  Recent Labs Lab 02/14/16 1353  WBC 15.7*  NEUTROABS 13.3*  HGB 8.9*  HCT 28.4*  MCV 74.0*  PLT 536   Basic Metabolic Panel:  Recent Labs Lab 02/14/16 1353  NA 131*  K 4.3  CL 102  CO2 21*  GLUCOSE 161*  BUN 19  CREATININE 2.20*  CALCIUM 8.5*   GFR: Estimated Creatinine Clearance: 39.7 mL/min (by C-G formula based on SCr of 2.2 mg/dL (H)). Liver Function Tests:  Recent Labs Lab 02/14/16 1353  AST 16  ALT 8*  ALKPHOS 68  BILITOT 0.7  PROT 7.8  ALBUMIN 2.3*   CBG:  Recent Labs Lab 02/14/16 2248  GLUCAP 195*   Urine analysis:    Component Value Date/Time   COLORURINE YELLOW 02/14/2016 [place] 02/14/2016 1359   LABSPEC 1.020 02/14/2016 1359   PHURINE 7.0 02/14/2016 1359   GLUCOSEU NEGATIVE 02/14/2016 1359    HGBUR MODERATE (A) 02/14/2016 1359   BILIRUBINUR NEGATIVE 02/14/2016 1359   KETONESUR NEGATIVE 02/14/2016 1359   PROTEINUR >300 (A) 02/14/2016 1359   UROBILINOGEN 1.0 08/11/2013 1128   NITRITE NEGATIVE 02/14/2016 1359   LEUKOCYTESUR NEGATIVE 02/14/2016 1359   Sepsis Labs:  Lactic acid level 1.23  Radiological Exams on Admission: Ct Abdomen Pelvis W Contrast  Result Date: 02/14/2016 CLINICAL DATA:  Upper abdominal pain EXAM: CT ABDOMEN AND PELVIS WITH CONTRAST TECHNIQUE: Multidetector CT imaging of the abdomen and pelvis was performed using the standard protocol following bolus administration of intravenous contrast. CONTRAST:  59mL  ISOVUE-300 IOPAMIDOL (ISOVUE-300) INJECTION 61% COMPARISON:  None. FINDINGS: Lower chest: Right lung bases clear. The left lung base demonstrates patchy infiltrate posteriorly. Hepatobiliary: No focal liver abnormality is seen. No gallstones, gallbladder wall thickening, or biliary dilatation. Pancreas: Unremarkable. No pancreatic ductal dilatation or surrounding inflammatory changes. Spleen: Normal in size without focal abnormality. Adrenals/Urinary Tract: Adrenal glands are unremarkable. Kidneys are normal, without renal calculi, focal lesion, or hydronephrosis. Bladder is unremarkable. Stomach/Bowel: Stomach is within normal limits. Appendix appears normal. No evidence of bowel wall thickening, distention, or inflammatory changes. Vascular/Lymphatic: No significant vascular findings are present. No enlarged abdominal or pelvic lymph nodes. Reproductive: Uterus and bilateral adnexa are unremarkable. Other: Minimal free fluid is noted in the pelvis likely physiologic in nature. Musculoskeletal: No acute or significant osseous findings. IMPRESSION: Left lower lobe pneumonia. No other focal abnormality is seen. Electronically Signed   By: Inez Catalina M.D.   On: 02/14/2016 19:43    EKG: Independently reviewed. Sinus tachycardia.  Assessment/Plan Principal Problem:    CAP (community acquired pneumonia) Active Problems:   Sepsis ([place])   Generalized abdominal pain      Sepsis secondary to CAP --Continue Rocephin and azithromycin --Check HIV --Blood cultures --Sputum culture if she can produce a sample --Urine streptococcal antigen --Guaifenesin prn --NS at 125cc/hr  AKI --Hydrate --Hold lisinopril --Repeat BMP in the AM  DM --SSI coverage TID with meals   DVT prophylaxis: Early ambulation Code Status: FULL Family Communication: Patient alone in the ED at time of admission. Disposition Plan: Expect she will go home at discharge. Consults called: NONE Admission status: Place in observation with telemetry monitoring.   TIME SPENT: 70 minutes   Eber Jones MD [facility] Pager [phone]  If 7PM-7AM, please contact night-coverage [email or web address] Password [facility]  02/14/2016, 11:46 PM

## 2016-02-14 NOTE — ED Notes (Signed)
Claiborne Billings, [place] notified of vitals

## 2016-02-14 NOTE — ED Notes (Signed)
IV team at bedside 

## 2016-02-14 NOTE — ED Triage Notes (Signed)
Pt. Stated, I've had stomach pain with N/V for 4 days.

## 2016-02-14 NOTE — ED Provider Notes (Signed)
[place] DEPT Provider Note   CSN: [id number] Arrival date & time: 02/14/16  1332     History   Chief Complaint Chief Complaint  Patient presents with  . Abdominal Pain  . Nausea  . Emesis    HPI [name] is a 36 y.o. female who presents with abdominal pain. PMH significant for Type 2 DM, HTN, Depression, hx of H. Pylori infection, anxiety/depression. Past surgical hx significant for tubal ligation. She was seen in the ED on 1/19 and diagnosed with flu-like illness. She states her symptoms have improved from that except for body aches and she still has residual cough. About 4 days ago she developed abdominal pain. The pain is constant, generalized. Nothing makes it better or worse. She states she has been unable to eat due to pain. She has associated fever, chills, N/V which is non-bloody, non-bilious. She denies chest pain, SOB, diarrhea, dysuria, vaginal discharge or bleeding.    HPI  Past Medical History:  Diagnosis Date  . Anxiety   . Asthma   . Depression   . Diabetes mellitus   . Hyperemesis arising during pregnancy   . Hypertension   . Mental disorder     Patient Active Problem List   Diagnosis Date Noted  . 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 Back pain 07/28/2011  . H. pylori infection 11/24/2010  . Depression 11/20/2010  . Asthma 11/20/2010  . Chronic hypertension in pregnancy 11/08/2010  . Type 2 diabetes mellitus ([place]) 11/07/2010    Past Surgical History:  Procedure Laterality Date  . NO PAST SURGERIES    . TUBAL LIGATION  06/20/2011   Procedure: POST PARTUM TUBAL LIGATION;  Surgeon: Mora Bellman, MD;  Location: [facility];  Service: Gynecology;  Laterality: Bilateral;  Bilateral post partum tubal ligation    OB History    Gravida Para Term Preterm AB Living   4 3 2 1 1 3    SAB TAB Ectopic Multiple Live Births   0 0 1 0 3       Home Medications    Prior to Admission medications   Medication Sig Start Date End Date Taking? Authorizing  Provider  azithromycin (ZITHROMAX) 250 MG tablet Take 1 tablet (250 mg total) by mouth daily. Patient not taking: Reported on 02/14/2016 10/11/14   Domenic Moras, PA-C  benzonatate (TESSALON PERLES) 100 MG capsule Take 1 capsule (100 mg total) by mouth 3 (three) times daily as needed for cough. Patient not taking: Reported on 02/14/2016 02/06/16   Courteney Lyn Mackuen, MD  benzonatate (TESSALON) 100 MG capsule Take 1 capsule (100 mg total) by mouth every 8 (eight) hours. Patient not taking: Reported on 02/14/2016 10/11/14   Domenic Moras, PA-C  guaiFENesin (ROBITUSSIN) 100 MG/5ML liquid Take 5-10 mLs (100-200 mg total) by mouth every 4 (four) hours as needed for cough. Patient not taking: Reported on 02/14/2016 02/06/16   Courteney Lyn Mackuen, MD  HYDROcodone-acetaminophen (NORCO/VICODIN) 5-325 MG per tablet Take 2 tablets by mouth every 4 (four) hours as needed. Patient not taking: Reported on 02/14/2016 08/11/13   Leonard Schwartz, MD  ibuprofen (ADVIL,MOTRIN) 800 MG tablet Take 1 tablet (800 mg total) by mouth 3 (three) times daily. Patient not taking: Reported on 02/14/2016 02/06/16   Courteney Lyn Mackuen, MD  lisinopril (PRINIVIL,ZESTRIL) 10 MG tablet Take 1 tablet (10 mg total) by mouth daily. Patient not taking: Reported on 02/14/2016 08/11/13   Leonard Schwartz, MD  predniSONE (DELTASONE) 20 MG tablet 2 tabs po daily x 4 days  Patient not taking: Reported on 02/14/2016 10/11/14   Domenic Moras, PA-C    Family History Family History  Problem Relation Age of Onset  . Heart disease Mother     CHF  . Diabetes Sister   . Anesthesia problems Neg Hx     Social History Social History  Substance Use Topics  . Smoking status: Never Smoker  . Smokeless tobacco: Never Used  . Alcohol use No     Comment: occassionally     Allergies   Shellfish allergy and Morphine and related   Review of Systems Review of Systems  Constitutional: Positive for appetite change, chills and fever.  Respiratory: Positive for  cough. Negative for shortness of breath.   Cardiovascular: Negative for chest pain.  Gastrointestinal: Positive for abdominal pain, nausea and vomiting. Negative for diarrhea.  Genitourinary: Negative for dysuria, vaginal bleeding and vaginal discharge.  All other systems reviewed and are negative.    Physical Exam Updated Vital Signs BP 108/82 (BP Location: Left Arm)   Pulse 115   Temp 100.3 F (37.9 C) (Oral)   Resp 18   Ht 5\' 7"  (1.702 m)   LMP 02/07/2016   SpO2 100%   Physical Exam  Constitutional: She is oriented to person, place, and time. She appears well-developed and well-nourished. She appears distressed (tearfully states she's dying).  HENT:  Head: Normocephalic and atraumatic.  Eyes: Conjunctivae are normal. Pupils are equal, round, and reactive to light. Right eye exhibits no discharge. Left eye exhibits no discharge. No scleral icterus.  Neck: Normal range of motion.  Cardiovascular: Tachycardia present.  Exam reveals no gallop and no friction rub.   No murmur heard. Pulmonary/Chest: Effort normal. No respiratory distress. She has wheezes (scattered ). She has no rales. She exhibits no tenderness.  Abdominal: Soft. Bowel sounds are normal. She exhibits no distension and no mass. There is tenderness. There is no rebound and no guarding. No hernia.  Generalized tenderness  Neurological: She is alert and oriented to person, place, and time.  Skin: Skin is warm and dry.  Psychiatric: She has a normal mood and affect. Her behavior is normal.  Nursing note and vitals reviewed.    ED Treatments / Results  Labs (all labs ordered are listed, but only abnormal results are displayed) Labs Reviewed  COMPREHENSIVE METABOLIC PANEL - Abnormal; Notable for the following:       Result Value   Sodium 131 (*)    CO2 21 (*)    Glucose, Bld 161 (*)    Creatinine, Ser 2.20 (*)    Calcium 8.5 (*)    Albumin 2.3 (*)    ALT 8 (*)    GFR calc non Af Amer 27 (*)    GFR calc Af  Amer 32 (*)    All other components within normal limits  CBC WITH DIFFERENTIAL/PLATELET - Abnormal; Notable for the following:    WBC 15.7 (*)    RBC 3.84 (*)    Hemoglobin 8.9 (*)    HCT 28.4 (*)    MCV 74.0 (*)    MCH 23.2 (*)    Neutro Abs 13.3 (*)    Monocytes Absolute 1.3 (*)    All other components within normal limits  URINALYSIS, ROUTINE W REFLEX MICROSCOPIC - Abnormal; Notable for the following:    Hgb urine dipstick MODERATE (*)    Protein, ur >300 (*)    All other components within normal limits  URINALYSIS, MICROSCOPIC (REFLEX) - Abnormal; Notable for the following:  Bacteria, UA FEW (*)    Squamous Epithelial / LPF 6-30 (*)    All other components within normal limits  I-STAT BETA HCG BLOOD, ED ([facility], [facility], [facility] ONLY) - Abnormal; Notable for the following:    I-stat hCG, quantitative 13.5 (*)    All other components within normal limits  CULTURE, BLOOD (ROUTINE X 2)  CULTURE, BLOOD (ROUTINE X 2)  PREGNANCY, URINE  I-STAT CG4 LACTIC ACID, ED    EKG  EKG Interpretation None       Radiology Ct Abdomen Pelvis W Contrast  Result Date: 02/14/2016 CLINICAL DATA:  Upper abdominal pain EXAM: CT ABDOMEN AND PELVIS WITH CONTRAST TECHNIQUE: Multidetector CT imaging of the abdomen and pelvis was performed using the standard protocol following bolus administration of intravenous contrast. CONTRAST:  84mL ISOVUE-300 IOPAMIDOL (ISOVUE-300) INJECTION 61% COMPARISON:  None. FINDINGS: Lower chest: Right lung bases clear. The left lung base demonstrates patchy infiltrate posteriorly. Hepatobiliary: No focal liver abnormality is seen. No gallstones, gallbladder wall thickening, or biliary dilatation. Pancreas: Unremarkable. No pancreatic ductal dilatation or surrounding inflammatory changes. Spleen: Normal in size without focal abnormality. Adrenals/Urinary Tract: Adrenal glands are unremarkable. Kidneys are normal, without renal calculi, focal lesion, or hydronephrosis. Bladder is  unremarkable. Stomach/Bowel: Stomach is within normal limits. Appendix appears normal. No evidence of bowel wall thickening, distention, or inflammatory changes. Vascular/Lymphatic: No significant vascular findings are present. No enlarged abdominal or pelvic lymph nodes. Reproductive: Uterus and bilateral adnexa are unremarkable. Other: Minimal free fluid is noted in the pelvis likely physiologic in nature. Musculoskeletal: No acute or significant osseous findings. IMPRESSION: Left lower lobe pneumonia. No other focal abnormality is seen. Electronically Signed   By: Inez Catalina M.D.   On: 02/14/2016 19:43    Procedures Procedures (including critical care time)  Medications Ordered in ED Medications  acetaminophen (TYLENOL) tablet 650 mg (not administered)  sodium chloride 0.9 % bolus 1,000 mL (not administered)    And  sodium chloride 0.9 % bolus 500 mL (not administered)  cefTRIAXone (ROCEPHIN) 1 g in dextrose 5 % 50 mL IVPB (not administered)  azithromycin (ZITHROMAX) 500 mg in dextrose 5 % 250 mL IVPB (not administered)  ondansetron (ZOFRAN) injection 4 mg (4 mg Intravenous Given 02/14/16 1455)  sodium chloride 0.9 % bolus 1,000 mL (0 mLs Intravenous Stopped 02/14/16 1759)  ketorolac (TORADOL) 30 MG/ML injection 30 mg (30 mg Intravenous Given 02/14/16 1457)  HYDROmorphone (DILAUDID) injection 0.5 mg (0.5 mg Intravenous Given 02/14/16 1601)  sodium chloride 0.9 % bolus 1,000 mL (0 mLs Intravenous Stopped 02/14/16 1906)  promethazine (PHENERGAN) injection 25 mg (25 mg Intravenous Given 02/14/16 1849)  iopamidol (ISOVUE-300) 61 % injection (100 mLs  Contrast Given 02/14/16 1921)     Initial Impression / Assessment and Plan / ED Course  I have reviewed the triage vital signs and the nursing notes.  Pertinent labs & imaging results that were available during my care of the patient were reviewed by me and considered in my medical decision making (see chart for details).  36 year old female with  sepsis from pneumonia. She is febrile and persistently tachycardic. BP has improved after several fluid boluses. She is not hypoxic. Abdomen in soft and has generalized tenderness. After several rounds of antiemetics, pain medicine, and fluids she is not improving. CT of abdomen ordered.  CT of abdomen remarkable for LLL PNA. On recheck of vitals her temp has increased to 102.4 and she is more tachycardic. Code sepsis called. Blood cultures drawn. Lactic acid  is normal. Remaining 1.5L ordered. CBC remarkable for leukocytosis (15.7) and CMP remarkable for elevated SCr from baseline (2.2). UA normal. Antibiotics ordered for CAP.  Spoke with Dr. Eulas Post who will admit patient to tele. Appreciate assistance.  Final Clinical Impressions(s) / ED Diagnoses   Final diagnoses:  Community acquired pneumonia of left lower lobe of lung ([place])  Sepsis, due to unspecified organism [facility])  Generalized abdominal pain    New Prescriptions New Prescriptions   No medications on file     Recardo Evangelist, PA-C 02/15/16 [place], MD 02/15/16 [phone]

## 2016-02-14 NOTE — ED Notes (Signed)
Report attempted 

## 2016-02-15 ENCOUNTER — Observation Stay (HOSPITAL_COMMUNITY): Payer: Self-pay

## 2016-02-15 DIAGNOSIS — R112 Nausea with vomiting, unspecified: Secondary | ICD-10-CM

## 2016-02-15 DIAGNOSIS — A419 Sepsis, unspecified organism: Secondary | ICD-10-CM

## 2016-02-15 DIAGNOSIS — D509 Iron deficiency anemia, unspecified: Secondary | ICD-10-CM

## 2016-02-15 DIAGNOSIS — R1084 Generalized abdominal pain: Secondary | ICD-10-CM

## 2016-02-15 LAB — RESPIRATORY PANEL BY PCR
Adenovirus: NOT DETECTED
BORDETELLA PERTUSSIS-RVPCR: NOT DETECTED
CORONAVIRUS 229E-RVPPCR: NOT DETECTED
Chlamydophila pneumoniae: NOT DETECTED
Coronavirus HKU1: NOT DETECTED
Coronavirus NL63: NOT DETECTED
Coronavirus OC43: NOT DETECTED
INFLUENZA B-RVPPCR: NOT DETECTED
Influenza A: NOT DETECTED
MYCOPLASMA PNEUMONIAE-RVPPCR: NOT DETECTED
Metapneumovirus: NOT DETECTED
Parainfluenza Virus 1: NOT DETECTED
Parainfluenza Virus 2: NOT DETECTED
Parainfluenza Virus 3: NOT DETECTED
Parainfluenza Virus 4: NOT DETECTED
RESPIRATORY SYNCYTIAL VIRUS-RVPPCR: NOT DETECTED
Rhinovirus / Enterovirus: NOT DETECTED

## 2016-02-15 LAB — BASIC METABOLIC PANEL
Anion gap: 10 (ref 5–15)
BUN: 19 mg/dL (ref 6–20)
CALCIUM: 7.3 mg/dL — AB (ref 8.9–10.3)
CO2: 17 mmol/L — ABNORMAL LOW (ref 22–32)
Chloride: 106 mmol/L (ref 101–111)
Creatinine, Ser: 2.16 mg/dL — ABNORMAL HIGH (ref 0.44–1.00)
GFR calc Af Amer: 32 mL/min — ABNORMAL LOW (ref >60)
GFR, EST NON AFRICAN AMERICAN: 28 mL/min — AB (ref >60)
GLUCOSE: 195 mg/dL — AB (ref 65–99)
Potassium: 4.3 mmol/L (ref 3.5–5.1)
SODIUM: 133 mmol/L — AB (ref 135–145)

## 2016-02-15 LAB — PROCALCITONIN: PROCALCITONIN: 2.72 ng/mL

## 2016-02-15 LAB — CBC
HCT: 25.7 % — ABNORMAL LOW (ref 36.0–46.0)
Hemoglobin: 7.9 g/dL — ABNORMAL LOW (ref 12.0–15.0)
MCH: 23.1 pg — AB (ref 26.0–34.0)
MCHC: 30.7 g/dL (ref 30.0–36.0)
MCV: 75.1 fL — AB (ref 78.0–100.0)
PLATELETS: 266 10*3/uL (ref 150–400)
RBC: 3.42 MIL/uL — ABNORMAL LOW (ref 3.87–5.11)
RDW: 15.3 % (ref 11.5–15.5)
WBC: 26.1 10*3/uL — AB (ref 4.0–10.5)

## 2016-02-15 LAB — LACTIC ACID, PLASMA: Lactic Acid, Venous: 1.4 mmol/L (ref 0.5–1.9)

## 2016-02-15 LAB — GLUCOSE, CAPILLARY
GLUCOSE-CAPILLARY: 170 mg/dL — AB (ref 65–99)
Glucose-Capillary: 85 mg/dL (ref 65–99)
Glucose-Capillary: 93 mg/dL (ref 65–99)

## 2016-02-15 LAB — INFLUENZA PANEL BY PCR (TYPE A & B)
Influenza A By PCR: NEGATIVE
Influenza B By PCR: NEGATIVE

## 2016-02-15 LAB — MRSA PCR SCREENING: MRSA BY PCR: NEGATIVE

## 2016-02-15 LAB — IRON AND TIBC: TIBC: 217 ug/dL — AB (ref 250–450)

## 2016-02-15 LAB — STREP PNEUMONIAE URINARY ANTIGEN: Strep Pneumo Urinary Antigen: NEGATIVE

## 2016-02-15 LAB — HIV ANTIBODY (ROUTINE TESTING W REFLEX): HIV SCREEN 4TH GENERATION: NONREACTIVE

## 2016-02-15 MED ORDER — ACETAMINOPHEN 325 MG PO TABS
650.0000 mg | ORAL_TABLET | Freq: Four times a day (QID) | ORAL | Status: DC | PRN
  Administered 2016-02-16: 650 mg via ORAL
  Filled 2016-02-15: qty 2

## 2016-02-15 MED ORDER — GUAIFENESIN 100 MG/5ML PO SOLN
100.0000 mg | ORAL | Status: DC | PRN
  Administered 2016-02-17: 200 mg via ORAL
  Filled 2016-02-15: qty 10

## 2016-02-15 MED ORDER — ONDANSETRON HCL 4 MG/2ML IJ SOLN
4.0000 mg | Freq: Three times a day (TID) | INTRAMUSCULAR | Status: DC | PRN
  Administered 2016-02-15 – 2016-02-17 (×2): 4 mg via INTRAVENOUS
  Filled 2016-02-15 (×2): qty 2

## 2016-02-15 MED ORDER — PANTOPRAZOLE SODIUM 40 MG IV SOLR
40.0000 mg | INTRAVENOUS | Status: DC
  Administered 2016-02-15 – 2016-02-17 (×3): 40 mg via INTRAVENOUS
  Filled 2016-02-15 (×3): qty 40

## 2016-02-15 MED ORDER — INSULIN ASPART 100 UNIT/ML ~~LOC~~ SOLN
0.0000 [IU] | Freq: Three times a day (TID) | SUBCUTANEOUS | Status: DC
  Administered 2016-02-15 – 2016-02-16 (×2): 3 [IU] via SUBCUTANEOUS
  Administered 2016-02-17 – 2016-02-18 (×3): 2 [IU] via SUBCUTANEOUS

## 2016-02-15 MED ORDER — AZITHROMYCIN 500 MG PO TABS
500.0000 mg | ORAL_TABLET | ORAL | Status: DC
  Administered 2016-02-15 – 2016-02-17 (×3): 500 mg via ORAL
  Filled 2016-02-15 (×3): qty 1

## 2016-02-15 MED ORDER — SODIUM CHLORIDE 0.9 % IV SOLN
INTRAVENOUS | Status: DC
  Administered 2016-02-15: 01:00:00 via INTRAVENOUS
  Administered 2016-02-15 (×2): 1000 mL via INTRAVENOUS
  Administered 2016-02-16 – 2016-02-18 (×5): via INTRAVENOUS

## 2016-02-15 MED ORDER — GUAIFENESIN ER 600 MG PO TB12
600.0000 mg | ORAL_TABLET | Freq: Two times a day (BID) | ORAL | Status: DC
  Administered 2016-02-15 – 2016-02-18 (×7): 600 mg via ORAL
  Filled 2016-02-15 (×7): qty 1

## 2016-02-15 MED ORDER — DEXTROSE 5 % IV SOLN
1.0000 g | INTRAVENOUS | Status: DC
  Administered 2016-02-15 – 2016-02-17 (×3): 1 g via INTRAVENOUS
  Filled 2016-02-15 (×4): qty 10

## 2016-02-15 NOTE — Progress Notes (Signed)
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  Assessment/Plan: Principal Problem:   CAP (community acquired pneumonia) Active Problems:   Sepsis ([place])   Generalized abdominal pain   Sepsis secondary to CAP Sepsis presented on admission with fever 102.4, sinus  Tachycardia 128, leukocytosis wbc 26, initial lactic acid wnl She reported being evaluated for flu likely symptom on 1/19 in the ED, she did not take tamiflu at the time Flu swab and respiratory viral panel negative this admission mrsa screening negative, blood culture in process , ua no infection, urine strep pneumo negative, urine legionella pending, hiv test pending, sputum sample pending collection --Continue Rocephin and azithromycin ---NS at 125cc/hr  AKI on ckd III? No baseline lab since 2015 -ua no infection, does has protein --Hydrate --Hold lisinopril --Repeat BMP in the AM   N/V: CT ab /pel with acute findings. Supportive care She also reported diarrhea x1, if diarrhea continues will collect stool sample, I have informed RN to document # of emesis and bm, inform MD if patient having diarrhea  Microcytic anemia:  hgb 8.9 on admission, 7.9 after hydration, continue to monitor hgb, she denies any sign of bleeding tbili wnl, will check iron/b12/folate/tsh/retic   DM, not taking any meds at home , no primary care physician --SSI coverage TID with meals -a1c pending, likely will need diabetes education  HTN;   lisinopril listed at home meds, patient report she does not take any meds, report no PMD bp stable so far  Body mass index is 30.7 kg/m.  DVT  prophylaxis: Early ambulation/scd's Code Status: FULL Family Communication: Patient  Disposition Plan: Expect she will go home at discharge in the next few days Consults called: NONE   Procedures:  none  Antibiotics:  Rocephin /zithro   Objective: BP 130/69 (BP Location: Left Arm)   Pulse (!) 113   Temp 99.8 F (37.7 C) (Oral)   Resp 15   Ht 5\' 7"  (1.702 m)   Wt 88.9 kg (196 lb)   LMP 02/07/2016   SpO2 100%   BMI 30.70 kg/m   Intake/Output Summary (Last 24 hours) at 02/15/16 0902 Last data filed at 02/15/16 0400  Gross per 24 hour  Intake          4143.75 ml  Output                0 ml  Net          4143.75 ml   Filed Weights   02/14/16 2014  Weight: 88.9 kg (196 lb)    Exam:   General:  Does not look comfortable  Cardiovascular: sinus tachycardia has improved  Respiratory: diminished at basis, no wheezing, no rales, no rhonchi  Abdomen: Soft/ND/NT, positive BS  Musculoskeletal: No Edema  Neuro: aaox3  Data Reviewed: Basic Metabolic Panel:  Recent Labs Lab 02/14/16 1353 02/15/16 0530  NA 131* 133*  K 4.3 4.3  CL 102 106  CO2 21* 17*  GLUCOSE 161* 195*  BUN 19 19  CREATININE 2.20* 2.16*  CALCIUM 8.5* 7.3*   Liver Function Tests:  Recent Labs Lab 02/14/16 1353  AST 16  ALT 8*  ALKPHOS 68  BILITOT 0.7  PROT 7.8  ALBUMIN 2.3*   No results for input(s): LIPASE, AMYLASE in the last 168 hours. No results for input(s): AMMONIA in the last 168 hours. CBC:  Recent Labs Lab 02/14/16 1353 02/15/16 0530  WBC 15.7* 26.1*  NEUTROABS 13.3*  --   HGB 8.9* 7.9*  HCT 28.4* 25.7*  MCV 74.0* 75.1*  PLT 331 266   Cardiac Enzymes:   No results for input(s): CKTOTAL, CKMB, CKMBINDEX, TROPONINI in the last 168 hours. BNP (last 3 results) No results for input(s): BNP in the last 8760 hours.  ProBNP (last 3 results) No results for input(s): PROBNP in the last 8760 hours.  CBG:  Recent Labs Lab 02/14/16 2248 02/15/16 0803  GLUCAP  195* 170*    No results found for this or any previous visit (from the past 240 hour(s)).   Studies: Ct Abdomen Pelvis W Contrast  Result Date: 02/14/2016 CLINICAL DATA:  Upper abdominal pain EXAM: CT ABDOMEN AND PELVIS WITH CONTRAST TECHNIQUE: Multidetector CT imaging of the abdomen and pelvis was performed using the standard protocol following bolus administration of intravenous contrast. CONTRAST:  73mL ISOVUE-300 IOPAMIDOL (ISOVUE-300) INJECTION 61% COMPARISON:  None. FINDINGS: Lower chest: Right lung bases clear. The left lung base demonstrates patchy infiltrate posteriorly. Hepatobiliary: No focal liver abnormality is seen. No gallstones, gallbladder wall thickening, or biliary dilatation. Pancreas: Unremarkable. No pancreatic ductal dilatation or surrounding inflammatory changes. Spleen: Normal in size without focal abnormality. Adrenals/Urinary Tract: Adrenal glands are unremarkable. Kidneys are normal, without renal calculi, focal lesion, or hydronephrosis. Bladder is unremarkable. Stomach/Bowel: Stomach is within normal limits. Appendix appears normal. No evidence of bowel wall thickening, distention, or inflammatory changes. Vascular/Lymphatic: No significant vascular findings are present. No enlarged abdominal or pelvic lymph nodes. Reproductive: Uterus and bilateral adnexa are unremarkable. Other: Minimal free fluid is noted in the pelvis likely physiologic in nature. Musculoskeletal: No acute or significant osseous findings. IMPRESSION: Left lower lobe pneumonia. No other focal abnormality is seen. Electronically Signed   By: Inez Catalina M.D.   On: 02/14/2016 19:43   Dg Chest Port 1 View  Result Date: 02/15/2016 CLINICAL DATA:  Cough, fever for 1 week EXAM: PORTABLE CHEST 1 VIEW COMPARISON:  02/06/2016 FINDINGS: Low lung volumes with bibasilar atelectasis and vascular crowding. No effusions. Heart is borderline in size. No acute bony abnormality. IMPRESSION: Low lung volumes with bibasilar  atelectasis. Electronically Signed   By: Rolm Baptise M.D.   On: 02/15/2016 08:31    Scheduled Meds: . azithromycin  500 mg Oral Q24H  . cefTRIAXone (ROCEPHIN)  IV  1 g Intravenous Q24H  . guaiFENesin  600 mg Oral BID  . insulin aspart  0-15 Units Subcutaneous TID WC    Continuous Infusions: . sodium chloride 125 mL/hr at 02/15/16 0115     Time spent: 70mins  Ayris Carano MD, PhD  [facility] Pager [phone]. If 7PM-7AM, please contact night-coverage at [email or web address], password [facility] 02/15/2016, 9:02 AM  LOS: 0 days

## 2016-02-16 LAB — CBC WITH DIFFERENTIAL/PLATELET
BASOS PCT: 0 %
Basophils Absolute: 0 10*3/uL (ref 0.0–0.1)
Eosinophils Absolute: 0 10*3/uL (ref 0.0–0.7)
Eosinophils Relative: 0 %
HEMATOCRIT: 23 % — AB (ref 36.0–46.0)
Hemoglobin: 7.1 g/dL — ABNORMAL LOW (ref 12.0–15.0)
Lymphocytes Relative: 18 %
Lymphs Abs: 2.7 10*3/uL (ref 0.7–4.0)
MCH: 23.1 pg — AB (ref 26.0–34.0)
MCHC: 30.9 g/dL (ref 30.0–36.0)
MCV: 74.9 fL — AB (ref 78.0–100.0)
MONO ABS: 1.3 10*3/uL — AB (ref 0.1–1.0)
MONOS PCT: 9 %
NEUTROS ABS: 10.8 10*3/uL — AB (ref 1.7–7.7)
Neutrophils Relative %: 73 %
Platelets: 265 10*3/uL (ref 150–400)
RBC: 3.07 MIL/uL — AB (ref 3.87–5.11)
RDW: 15.6 % — AB (ref 11.5–15.5)
WBC: 14.8 10*3/uL — AB (ref 4.0–10.5)

## 2016-02-16 LAB — LACTIC ACID, PLASMA: LACTIC ACID, VENOUS: 0.6 mmol/L (ref 0.5–1.9)

## 2016-02-16 LAB — LEGIONELLA PNEUMOPHILA SEROGP 1 UR AG: L. pneumophila Serogp 1 Ur Ag: NEGATIVE

## 2016-02-16 LAB — RETICULOCYTES
RBC.: 3.07 MIL/uL — ABNORMAL LOW (ref 3.87–5.11)
RETIC COUNT ABSOLUTE: 12.3 10*3/uL — AB (ref 19.0–186.0)
Retic Ct Pct: 0.4 % (ref 0.4–3.1)

## 2016-02-16 LAB — GLUCOSE, CAPILLARY
GLUCOSE-CAPILLARY: 112 mg/dL — AB (ref 65–99)
GLUCOSE-CAPILLARY: 173 mg/dL — AB (ref 65–99)
Glucose-Capillary: 96 mg/dL (ref 65–99)
Glucose-Capillary: 167 mg/dL — ABNORMAL HIGH (ref 65–99)

## 2016-02-16 LAB — RAPID URINE DRUG SCREEN, HOSP PERFORMED
Amphetamines: NOT DETECTED
Barbiturates: NOT DETECTED
Benzodiazepines: NOT DETECTED
COCAINE: NOT DETECTED
Opiates: POSITIVE — AB
Tetrahydrocannabinol: POSITIVE — AB

## 2016-02-16 LAB — FOLATE: FOLATE: 8 ng/mL (ref >5.9)

## 2016-02-16 LAB — BASIC METABOLIC PANEL
Anion gap: 4 — ABNORMAL LOW (ref 5–15)
BUN: 15 mg/dL (ref 6–20)
CALCIUM: 8 mg/dL — AB (ref 8.9–10.3)
CO2: 22 mmol/L (ref 22–32)
Chloride: 108 mmol/L (ref 101–111)
Creatinine, Ser: 1.86 mg/dL — ABNORMAL HIGH (ref 0.44–1.00)
GFR calc Af Amer: 39 mL/min — ABNORMAL LOW (ref >60)
GFR, EST NON AFRICAN AMERICAN: 33 mL/min — AB (ref >60)
GLUCOSE: 121 mg/dL — AB (ref 65–99)
Potassium: 3.9 mmol/L (ref 3.5–5.1)
Sodium: 134 mmol/L — ABNORMAL LOW (ref 135–145)

## 2016-02-16 LAB — TSH: TSH: 1.283 u[IU]/mL (ref 0.350–4.500)

## 2016-02-16 LAB — VITAMIN B12: Vitamin B-12: 989 pg/mL — ABNORMAL HIGH (ref 180–914)

## 2016-02-16 MED ORDER — WHITE PETROLATUM GEL
Status: AC
  Administered 2016-02-16: 1
  Filled 2016-02-16: qty 1

## 2016-02-16 NOTE — Progress Notes (Signed)
PROGRESS NOTE  [name] LNL:[id number] DOB: [birth year] DOA: 02/14/2016 PCP: Pcp Not In System  HPI/Recap of past 24 hours:  No more vomiting , want to eat more, no diarrhea last 24hrs  C/o generalized body aches  Fever 102.4 on admission, trending down    Assessment/Plan: Principal Problem:   CAP (community acquired pneumonia) Active Problems:   Sepsis ([place])   Generalized abdominal pain   Sepsis secondary to CAP Sepsis presented on admission with fever 102.4, sinus  Tachycardia 128, leukocytosis wbc 26, initial lactic acid wnl She reported being evaluated for flu likely symptom on 1/19 in the ED, she did not take tamiflu at the time Flu swab and respiratory viral panel negative this admission mrsa screening negative, blood culture in process , ua no infection, urine strep pneumo negative, urine legionella pending, hiv test pending, sputum sample pending collection --Continue Rocephin and azithromycin ---NS at 125cc/hr  AKI on ckd III? No baseline lab since 2015 -ua no infection, does has protein --Hydrate --Hold lisinopril --cr improving   N/V: CT ab /pel with acute findings. Supportive care She also reported diarrhea x1,no diarrhea the last 24hrs uds + THC which could cause n/v as well  Microcytic anemia:  hgb 8.9 on admission, 7.9 after hydration, continue to monitor hgb, she denies any sign of bleeding tbili wnl, will check iron/b12/folate/tsh/retic   DM, not taking any meds at home , no primary care physician --SSI coverage TID with meals -a1c pending, likely will need diabetes education  HTN;   lisinopril listed at home meds, patient report she does not take any meds, report no PMD bp stable so far  Body mass index is 30.7 kg/m.  DVT prophylaxis: Early ambulation/scd's Code Status: FULL Family Communication: Patient  Disposition Plan: home in 1-2days, need primary care Consults called:  NONE   Procedures:  none  Antibiotics:  Rocephin /zithro   Objective: BP (!) 145/85 (BP Location: Right Arm)   Pulse 80   Temp 99.1 F (37.3 C) (Oral)   Resp 18   Ht 5\' 7"  (1.702 m)   Wt 88.9 kg (196 lb)   LMP 02/07/2016   SpO2 100%   BMI 30.70 kg/m   Intake/Output Summary (Last 24 hours) at 02/16/16 0830 Last data filed at 02/16/16 0600  Gross per 24 hour  Intake             3225 ml  Output                0 ml  Net             3225 ml   Filed Weights   02/14/16 2014  Weight: 88.9 kg (196 lb)    Exam:   General:  Seems better,  Cardiovascular: sinus tachycardia has improved  Respiratory: diminished at basis, no wheezing, no rales, no rhonchi  Abdomen: Soft/ND/NT, positive BS  Musculoskeletal: No Edema  Neuro: aaox3  Data Reviewed: Basic Metabolic Panel:  Recent Labs Lab 02/14/16 1353 02/15/16 0530 02/16/16 0500  NA 131* 133* 134*  K 4.3 4.3 3.9  CL 102 106 108  CO2 21* 17* 22  GLUCOSE 161* 195* 121*  BUN 19 19 15   CREATININE 2.20* 2.16* 1.86*  CALCIUM 8.5* 7.3* 8.0*   Liver Function Tests:  Recent Labs Lab 02/14/16 1353  AST 16  ALT 8*  ALKPHOS 68  BILITOT 0.7  PROT 7.8  ALBUMIN 2.3*   No results for input(s): LIPASE, AMYLASE in the last 168 hours. No  results for input(s): AMMONIA in the last 168 hours. CBC:  Recent Labs Lab 02/14/16 1353 02/15/16 0530 02/16/16 0500  WBC 15.7* 26.1* 14.8*  NEUTROABS 13.3*  --  PENDING  HGB 8.9* 7.9* 7.1*  HCT 28.4* 25.7* 23.0*  MCV 74.0* 75.1* 74.9*  PLT 331 266 265   Cardiac Enzymes:   No results for input(s): CKTOTAL, CKMB, CKMBINDEX, TROPONINI in the last 168 hours. BNP (last 3 results) No results for input(s): BNP in the last 8760 hours.  ProBNP (last 3 results) No results for input(s): PROBNP in the last 8760 hours.  CBG:  Recent Labs Lab 02/14/16 2248 02/15/16 0803 02/15/16 1204 02/15/16 1726 02/16/16 0755  GLUCAP 195* 170* 85 93 112*    Recent Results (from the  past 240 hour(s))  Blood culture (routine x 2)     Status: None (Preliminary result)   Collection Time: 02/14/16  8:27 PM  Result Value Ref Range Status   Specimen Description BLOOD RIGHT FOREARM  Final   Special Requests IN PEDIATRIC BOTTLE 1.5CC  Final   Culture NO GROWTH < 24 HOURS  Final   Report Status PENDING  Incomplete  Blood culture (routine x 2)     Status: None (Preliminary result)   Collection Time: 02/14/16  8:38 PM  Result Value Ref Range Status   Specimen Description BLOOD RIGHT HAND  Final   Special Requests BOTTLES DRAWN AEROBIC AND ANAEROBIC 5CC EA  Final   Culture NO GROWTH < 24 HOURS  Final   Report Status PENDING  Incomplete  MRSA PCR Screening     Status: None   Collection Time: 02/15/16  8:15 AM  Result Value Ref Range Status   MRSA by PCR NEGATIVE NEGATIVE Final    Comment:        The GeneXpert MRSA Assay (FDA approved for NASAL specimens only), is one component of a comprehensive MRSA colonization surveillance program. It is not intended to diagnose MRSA infection nor to guide or monitor treatment for MRSA infections.   Respiratory Panel by PCR     Status: None   Collection Time: 02/15/16  9:11 AM  Result Value Ref Range Status   Adenovirus NOT DETECTED NOT DETECTED Final   Coronavirus 229E NOT DETECTED NOT DETECTED Final   Coronavirus HKU1 NOT DETECTED NOT DETECTED Final   Coronavirus NL63 NOT DETECTED NOT DETECTED Final   Coronavirus OC43 NOT DETECTED NOT DETECTED Final   Metapneumovirus NOT DETECTED NOT DETECTED Final   Rhinovirus / Enterovirus NOT DETECTED NOT DETECTED Final   Influenza A NOT DETECTED NOT DETECTED Final   Influenza B NOT DETECTED NOT DETECTED Final   Parainfluenza Virus 1 NOT DETECTED NOT DETECTED Final   Parainfluenza Virus 2 NOT DETECTED NOT DETECTED Final   Parainfluenza Virus 3 NOT DETECTED NOT DETECTED Final   Parainfluenza Virus 4 NOT DETECTED NOT DETECTED Final   Respiratory Syncytial Virus NOT DETECTED NOT DETECTED  Final   Bordetella pertussis NOT DETECTED NOT DETECTED Final   Chlamydophila pneumoniae NOT DETECTED NOT DETECTED Final   Mycoplasma pneumoniae NOT DETECTED NOT DETECTED Final     Studies: No results found.  Scheduled Meds: . azithromycin  500 mg Oral Q24H  . cefTRIAXone (ROCEPHIN)  IV  1 g Intravenous Q24H  . guaiFENesin  600 mg Oral BID  . insulin aspart  0-15 Units Subcutaneous TID WC  . pantoprazole (PROTONIX) IV  40 mg Intravenous Q24H    Continuous Infusions: . sodium chloride 125 mL/hr at 02/16/16 0012  Time spent: 23mins  Charnika Herbst MD, PhD  [facility] Pager [phone]. If 7PM-7AM, please contact night-coverage at [email or web address], password [facility] 02/16/2016, 8:30 AM  LOS: 0 days

## 2016-02-17 DIAGNOSIS — D508 Other iron deficiency anemias: Secondary | ICD-10-CM

## 2016-02-17 DIAGNOSIS — E119 Type 2 diabetes mellitus without complications: Secondary | ICD-10-CM

## 2016-02-17 LAB — GLUCOSE, CAPILLARY
GLUCOSE-CAPILLARY: 133 mg/dL — AB (ref 65–99)
Glucose-Capillary: 124 mg/dL — ABNORMAL HIGH (ref 65–99)
Glucose-Capillary: 103 mg/dL — ABNORMAL HIGH (ref 65–99)
Glucose-Capillary: 139 mg/dL — ABNORMAL HIGH (ref 65–99)

## 2016-02-17 LAB — CBC
HCT: 22.1 % — ABNORMAL LOW (ref 36.0–46.0)
Hemoglobin: 7 g/dL — ABNORMAL LOW (ref 12.0–15.0)
MCH: 24.1 pg — AB (ref 26.0–34.0)
MCHC: 31.7 g/dL (ref 30.0–36.0)
MCV: 76.2 fL — AB (ref 78.0–100.0)
Platelets: 255 10*3/uL (ref 150–400)
RBC: 2.9 MIL/uL — AB (ref 3.87–5.11)
RDW: 16 % — AB (ref 11.5–15.5)
WBC: 8.4 10*3/uL (ref 4.0–10.5)

## 2016-02-17 LAB — HEMOGLOBIN A1C
Hgb A1c MFr Bld: 7.5 % — ABNORMAL HIGH (ref 4.8–5.6)
Mean Plasma Glucose: 169 mg/dL

## 2016-02-17 LAB — BASIC METABOLIC PANEL
Anion gap: 6 (ref 5–15)
BUN: 12 mg/dL (ref 6–20)
CO2: 21 mmol/L — ABNORMAL LOW (ref 22–32)
CREATININE: 1.63 mg/dL — AB (ref 0.44–1.00)
Calcium: 8.1 mg/dL — ABNORMAL LOW (ref 8.9–10.3)
Chloride: 110 mmol/L (ref 101–111)
GFR calc Af Amer: 45 mL/min — ABNORMAL LOW (ref >60)
GFR calc non Af Amer: 39 mL/min — ABNORMAL LOW (ref >60)
GLUCOSE: 153 mg/dL — AB (ref 65–99)
POTASSIUM: 4.5 mmol/L (ref 3.5–5.1)
Sodium: 137 mmol/L (ref 135–145)

## 2016-02-17 LAB — PROCALCITONIN: Procalcitonin: 1.07 ng/mL

## 2016-02-17 MED ORDER — PANTOPRAZOLE SODIUM 40 MG PO TBEC
40.0000 mg | DELAYED_RELEASE_TABLET | Freq: Every day | ORAL | Status: DC
  Administered 2016-02-18: 40 mg via ORAL
  Filled 2016-02-17: qty 1

## 2016-02-17 MED ORDER — ENOXAPARIN SODIUM 40 MG/0.4ML ~~LOC~~ SOLN
40.0000 mg | SUBCUTANEOUS | Status: DC
  Administered 2016-02-17: 40 mg via SUBCUTANEOUS
  Filled 2016-02-17: qty 0.4

## 2016-02-17 MED ORDER — BLISTEX MEDICATED EX OINT
TOPICAL_OINTMENT | CUTANEOUS | Status: DC | PRN
  Administered 2016-02-17: 17:00:00 via TOPICAL
  Filled 2016-02-17: qty 6.3

## 2016-02-17 NOTE — Progress Notes (Addendum)
PROGRESS NOTE  [name] YKZ:[id number] DOB: 1980/11/16 DOA: 02/14/2016 PCP: Pcp Not In System  Brief Summary:  [name] is a 36 y.o. woman with a history of HTN, DM, asthma, and anxiety/depression who presented to the ED for evaluation of two days of cough productive of yellow sputum.  She has also had nausea and vomiting.  She has had abdominal pain and diarrhea She was seen for the same on 1/19 was told to have flu, but she report she was not tested for flu, she was not given flu meds at the time. She is tested negative for flu this hospitalization, but she does has pneumonia    HPI/Recap of past 24 hours:  Report vomited once last night, today she wants to eat more, no diarrhea   She looks better but she is not happy, has multiple complaints, report has not been feeling well since the first ED visit on 1/19.  C/o generalized body aches, continue to have nonproductive cough ( she spit out clear saliva after coughing)  Fever 102.4 on admission, no fever last 24hrs, wbc normalized, cr improving, procalcitonin trending down    Assessment/Plan: Principal Problem:   CAP (community acquired pneumonia) Active Problems:   Sepsis ([place])   Generalized abdominal pain   Sepsis secondary to CAP Sepsis presented on admission with fever 102.4, sinus  Tachycardia 128, leukocytosis wbc 26, initial lactic acid wnl She reported being evaluated for flu likely symptom on 1/19 in the ED, she did not take tamiflu at the time Flu swab and respiratory viral panel negative this admission mrsa screening negative, blood culture no growth , ua no infection, urine strep pneumo negative, urine legionella negative, hiv test negative, sputum sample pending collection --Continue Rocephin and azithromycin ---NS at 125cc/hr  AKI on ckd III? No baseline lab since 2015 -ua no infection, does has protein --Hydrate --Hold lisinopril --cr improving   N/V: CT ab /pel with acute findings. Supportive  care She also reported diarrhea x1,no diarrhea the last 24hrs uds + THC which could cause n/v as well  Microcytic anemia: iron deficiency,  hgb 8.9 on admission, 7.9 after hydration, continue to monitor hgb, she denies any sign of bleeding tbili wnl, b12/folate/tsh nwl, retic inappropriately low Will avoid iv iron in the setting of acute infection, consider start oral iron at discharge. Need to establish care with pmd to follow up on anemia.   DM, not taking any meds at home , no primary care physician, report prior h/o gestational diabetes, was taken off meds. --SSI coverage TID with meals -a1c 7.5, likely will need diabetes education  HTN;   lisinopril listed at home meds, patient report she does not take any meds, report no PMD bp stable so far  Body mass index is 30.7 kg/m.  DVT prophylaxis: lovenox Code Status: FULL Family Communication: Patient  Disposition Plan: home in 1-2days pending clinical improvement, culture result, cr level, need primary care Consults called: NONE   Procedures:  none  Antibiotics:  Rocephin /zithro   Objective: BP 134/74 (BP Location: Right Arm)   Pulse 79   Temp 98 F (36.7 C) (Oral)   Resp 18   Ht 5\' 7"  (1.702 m)   Wt 88.9 kg (196 lb)   LMP 02/07/2016   SpO2 98%   BMI 30.70 kg/m   Intake/Output Summary (Last 24 hours) at 02/17/16 1207 Last data filed at 02/17/16 0432  Gross per 24 hour  Intake  1372.92 ml  Output             1400 ml  Net           -27.08 ml   Filed Weights   02/14/16 2014  Weight: 88.9 kg (196 lb)    Exam:   General:  Seems better, though continue to report not getting any better  Cardiovascular: sinus tachycardia has improved  Respiratory: diminished at basis, no wheezing, no rales, no rhonchi  Abdomen: Soft/ND/NT, positive BS  Musculoskeletal: No Edema  Neuro: aaox3  Data Reviewed: Basic Metabolic Panel:  Recent Labs Lab 02/14/16 1353 02/15/16 0530 02/16/16 0500  02/17/16 0529  NA 131* 133* 134* 137  K 4.3 4.3 3.9 4.5  CL 102 106 108 110  CO2 21* 17* 22 21*  GLUCOSE 161* 195* 121* 153*  BUN 19 19 15 12   CREATININE 2.20* 2.16* 1.86* 1.63*  CALCIUM 8.5* 7.3* 8.0* 8.1*   Liver Function Tests:  Recent Labs Lab 02/14/16 1353  AST 16  ALT 8*  ALKPHOS 68  BILITOT 0.7  PROT 7.8  ALBUMIN 2.3*   No results for input(s): LIPASE, AMYLASE in the last 168 hours. No results for input(s): AMMONIA in the last 168 hours. CBC:  Recent Labs Lab 02/14/16 1353 02/15/16 0530 02/16/16 0500 02/17/16 0529  WBC 15.7* 26.1* 14.8* 8.4  NEUTROABS 13.3*  --  10.8*  --   HGB 8.9* 7.9* 7.1* 7.0*  HCT 28.4* 25.7* 23.0* 22.1*  MCV 74.0* 75.1* 74.9* 76.2*  PLT 331 266 265 255   Cardiac Enzymes:   No results for input(s): CKTOTAL, CKMB, CKMBINDEX, TROPONINI in the last 168 hours. BNP (last 3 results) No results for input(s): BNP in the last 8760 hours.  ProBNP (last 3 results) No results for input(s): PROBNP in the last 8760 hours.  CBG:  Recent Labs Lab 02/16/16 0755 02/16/16 1130 02/16/16 1729 02/16/16 2223 02/17/16 0744  GLUCAP 112* 173* 96 167* 124*    Recent Results (from the past 240 hour(s))  Blood culture (routine x 2)     Status: None (Preliminary result)   Collection Time: 02/14/16  8:27 PM  Result Value Ref Range Status   Specimen Description BLOOD RIGHT FOREARM  Final   Special Requests IN PEDIATRIC BOTTLE 1.5CC  Final   Culture NO GROWTH 2 DAYS  Final   Report Status PENDING  Incomplete  Blood culture (routine x 2)     Status: None (Preliminary result)   Collection Time: 02/14/16  8:38 PM  Result Value Ref Range Status   Specimen Description BLOOD RIGHT HAND  Final   Special Requests BOTTLES DRAWN AEROBIC AND ANAEROBIC 5CC EA  Final   Culture NO GROWTH 2 DAYS  Final   Report Status PENDING  Incomplete  MRSA PCR Screening     Status: None   Collection Time: 02/15/16  8:15 AM  Result Value Ref Range Status   MRSA by PCR  NEGATIVE NEGATIVE Final    Comment:        The GeneXpert MRSA Assay (FDA approved for NASAL specimens only), is one component of a comprehensive MRSA colonization surveillance program. It is not intended to diagnose MRSA infection nor to guide or monitor treatment for MRSA infections.   Respiratory Panel by PCR     Status: None   Collection Time: 02/15/16  9:11 AM  Result Value Ref Range Status   Adenovirus NOT DETECTED NOT DETECTED Final   Coronavirus 229E NOT DETECTED NOT DETECTED Final  Coronavirus HKU1 NOT DETECTED NOT DETECTED Final   Coronavirus NL63 NOT DETECTED NOT DETECTED Final   Coronavirus OC43 NOT DETECTED NOT DETECTED Final   Metapneumovirus NOT DETECTED NOT DETECTED Final   Rhinovirus / Enterovirus NOT DETECTED NOT DETECTED Final   Influenza A NOT DETECTED NOT DETECTED Final   Influenza B NOT DETECTED NOT DETECTED Final   Parainfluenza Virus 1 NOT DETECTED NOT DETECTED Final   Parainfluenza Virus 2 NOT DETECTED NOT DETECTED Final   Parainfluenza Virus 3 NOT DETECTED NOT DETECTED Final   Parainfluenza Virus 4 NOT DETECTED NOT DETECTED Final   Respiratory Syncytial Virus NOT DETECTED NOT DETECTED Final   Bordetella pertussis NOT DETECTED NOT DETECTED Final   Chlamydophila pneumoniae NOT DETECTED NOT DETECTED Final   Mycoplasma pneumoniae NOT DETECTED NOT DETECTED Final     Studies: No results found.  Scheduled Meds: . azithromycin  500 mg Oral Q24H  . cefTRIAXone (ROCEPHIN)  IV  1 g Intravenous Q24H  . guaiFENesin  600 mg Oral BID  . insulin aspart  0-15 Units Subcutaneous TID WC  . [START ON 02/18/2016] pantoprazole  40 mg Oral Daily    Continuous Infusions: . sodium chloride 125 mL/hr at 02/17/16 1000     Time spent: 57mins  Marquist Binstock MD, PhD  [facility] Pager [phone]. If 7PM-7AM, please contact night-coverage at [email or web address], password [facility] 02/17/2016, 12:07 PM  LOS: 1 day

## 2016-02-18 DIAGNOSIS — J189 Pneumonia, unspecified organism: Secondary | ICD-10-CM

## 2016-02-18 LAB — BASIC METABOLIC PANEL
ANION GAP: 5 (ref 5–15)
BUN: 7 mg/dL (ref 6–20)
CHLORIDE: 111 mmol/L (ref 101–111)
CO2: 21 mmol/L — AB (ref 22–32)
CREATININE: 1.33 mg/dL — AB (ref 0.44–1.00)
Calcium: 8.2 mg/dL — ABNORMAL LOW (ref 8.9–10.3)
GFR calc non Af Amer: 50 mL/min — ABNORMAL LOW (ref >60)
GFR, EST AFRICAN AMERICAN: 58 mL/min — AB (ref >60)
Glucose, Bld: 108 mg/dL — ABNORMAL HIGH (ref 65–99)
Potassium: 4.4 mmol/L (ref 3.5–5.1)
Sodium: 137 mmol/L (ref 135–145)

## 2016-02-18 LAB — GLUCOSE, CAPILLARY
GLUCOSE-CAPILLARY: 131 mg/dL — AB (ref 65–99)
GLUCOSE-CAPILLARY: 96 mg/dL (ref 65–99)
Glucose-Capillary: 95 mg/dL (ref 65–99)

## 2016-02-18 MED ORDER — AMLODIPINE BESYLATE 5 MG PO TABS
5.0000 mg | ORAL_TABLET | Freq: Every day | ORAL | Status: DC
  Administered 2016-02-18: 5 mg via ORAL
  Filled 2016-02-18: qty 1

## 2016-02-18 MED ORDER — CEFDINIR 300 MG PO CAPS: 300.0000 mg | ORAL_CAPSULE | Freq: Two times a day (BID) | ORAL | 0 refills | Status: AC

## 2016-02-18 MED ORDER — HYDROCODONE-ACETAMINOPHEN 5-325 MG PO TABS
1.0000 | ORAL_TABLET | ORAL | Status: DC | PRN
  Administered 2016-02-18: 2 via ORAL
  Administered 2016-02-18: 1 via ORAL
  Filled 2016-02-18 (×2): qty 2

## 2016-02-18 MED ORDER — FERROUS SULFATE 325 (65 FE) MG PO TABS: 325.0000 mg | ORAL_TABLET | Freq: Three times a day (TID) | ORAL | 0 refills | Status: DC

## 2016-02-18 MED ORDER — AZITHROMYCIN 250 MG PO TABS: 500.0000 mg | ORAL_TABLET | Freq: Every day | ORAL | 0 refills | Status: AC

## 2016-02-18 MED ORDER — AMLODIPINE BESYLATE 5 MG PO TABS
5.0000 mg | ORAL_TABLET | Freq: Every day | ORAL | 0 refills | Status: DC
Start: 2016-02-18 — End: 2018-08-25

## 2016-02-18 NOTE — Progress Notes (Signed)
Pt's BP is 178/98. MD made aware. Waiting on response. Will continue to monitor.

## 2016-02-18 NOTE — Discharge Summary (Signed)
Physician Discharge Summary  [name] RDE:[id number] DOB: December 16, 1980 DOA: 02/14/2016  PCP: Pcp Not In System  Admit date: 02/14/2016 Discharge date: 02/18/2016  Time spent: > 35 minutes  Recommendations for Outpatient Follow-up:  1. Monitor hgb levels   Discharge Diagnoses:  Principal Problem:   CAP (community acquired pneumonia) Active Problems:   Sepsis ([place])   Generalized abdominal pain   Discharge Condition: stable  Diet recommendation: carb modified diet  Filed Weights   02/14/16 2014  Weight: 88.9 kg (196 lb)    History of present illness:  36 y.o.woman with a history of HTN, DM, asthma, and anxiety/depression who presented to the ED for evaluation of two days of cough productive of yellow sputum  Hospital Course:  Sepsis secondary to CAP Resolved on azithromycin and rocephin.  AKI on ckd III? hold lisinopril on d/c  N/V: Resolved, did note THC on work up per prior note.  Microcytic anemia: iron deficiency,  hgb 8.9 on admission, 7.9 after hydration, continue to monitor hgb, she denies any sign of bleeding tbili wnl, b12/folate/tsh nwl, retic inappropriately low Will d/c on iron supplementation.  DM, not taking any meds at home , no primary care physician, report prior h/o gestational diabetes, was taken off meds. --SSI coverage TID with meals -a1c 7.5, likely will need diabetes education  HTN;  Amlodipine.  Procedures:  None  Consultations:  None  Discharge Exam: Vitals:   02/18/16 1413 02/18/16 1424  BP: (!) 177/99 (!) 178/98  Pulse: 88   Resp: 18   Temp: 98.3 F (36.8 C)     General: nad, alert and awake Cardiovascular: rrr, no rubs Respiratory: no increased wob, no wheezes  Discharge Instructions   Discharge Instructions    Call MD for:  difficulty breathing, headache or visual disturbances    Complete by:  As directed    Call MD for:  severe uncontrolled pain    Complete by:  As directed    Call MD for:  temperature  >100.4    Complete by:  As directed    Diet - low sodium heart healthy    Complete by:  As directed    Increase activity slowly    Complete by:  As directed      Current Discharge Medication List    START taking these medications   Details  amLODipine (NORVASC) 5 MG tablet Take 1 tablet (5 mg total) by mouth daily. Qty: 30 tablet, Refills: 0    cefdinir (OMNICEF) 300 MG capsule Take 1 capsule (300 mg total) by mouth 2 (two) times daily. Qty: 6 capsule, Refills: 0    ferrous sulfate 325 (65 FE) MG tablet Take 1 tablet (325 mg total) by mouth 3 (three) times daily with meals. Qty: 60 tablet, Refills: 0      CONTINUE these medications which have CHANGED   Details  azithromycin (ZITHROMAX) 250 MG tablet Take 2 tablets (500 mg total) by mouth daily. Qty: 4 tablet, Refills: 0      CONTINUE these medications which have NOT CHANGED   Details  benzonatate (TESSALON PERLES) 100 MG capsule Take 1 capsule (100 mg total) by mouth 3 (three) times daily as needed for cough. Qty: 20 capsule, Refills: 0      STOP taking these medications     guaiFENesin (ROBITUSSIN) 100 MG/5ML liquid      HYDROcodone-acetaminophen (NORCO/VICODIN) 5-325 MG per tablet      ibuprofen (ADVIL,MOTRIN) 800 MG tablet      lisinopril (PRINIVIL,ZESTRIL) 10 MG  tablet      predniSONE (DELTASONE) 20 MG tablet        Allergies  Allergen Reactions  . Shellfish Allergy Anaphylaxis and Swelling  . Morphine And Related Nausea And Vomiting      The results of significant diagnostics from this hospitalization (including imaging, microbiology, ancillary and laboratory) are listed below for reference.    Significant Diagnostic Studies: Dg Chest 2 View  Result Date: 02/06/2016 CLINICAL DATA:  Pt has been having chest pain for 2 days. Fever last night. EXAM: CHEST  2 VIEW COMPARISON:  Chest x-ray dated 10/11/2014 FINDINGS: The heart size and mediastinal contours are within normal limits. Both lungs are clear. The  visualized skeletal structures are unremarkable. IMPRESSION: No active cardiopulmonary disease. No evidence of pneumonia or pulmonary edema. Electronically Signed   By: Franki Cabot M.D.   On: 02/06/2016 15:01   Ct Abdomen Pelvis W Contrast  Result Date: 02/14/2016 CLINICAL DATA:  Upper abdominal pain EXAM: CT ABDOMEN AND PELVIS WITH CONTRAST TECHNIQUE: Multidetector CT imaging of the abdomen and pelvis was performed using the standard protocol following bolus administration of intravenous contrast. CONTRAST:  74mL ISOVUE-300 IOPAMIDOL (ISOVUE-300) INJECTION 61% COMPARISON:  None. FINDINGS: Lower chest: Right lung bases clear. The left lung base demonstrates patchy infiltrate posteriorly. Hepatobiliary: No focal liver abnormality is seen. No gallstones, gallbladder wall thickening, or biliary dilatation. Pancreas: Unremarkable. No pancreatic ductal dilatation or surrounding inflammatory changes. Spleen: Normal in size without focal abnormality. Adrenals/Urinary Tract: Adrenal glands are unremarkable. Kidneys are normal, without renal calculi, focal lesion, or hydronephrosis. Bladder is unremarkable. Stomach/Bowel: Stomach is within normal limits. Appendix appears normal. No evidence of bowel wall thickening, distention, or inflammatory changes. Vascular/Lymphatic: No significant vascular findings are present. No enlarged abdominal or pelvic lymph nodes. Reproductive: Uterus and bilateral adnexa are unremarkable. Other: Minimal free fluid is noted in the pelvis likely physiologic in nature. Musculoskeletal: No acute or significant osseous findings. IMPRESSION: Left lower lobe pneumonia. No other focal abnormality is seen. Electronically Signed   By: Inez Catalina M.D.   On: 02/14/2016 19:43   Dg Chest Port 1 View  Result Date: 02/15/2016 CLINICAL DATA:  Cough, fever for 1 week EXAM: PORTABLE CHEST 1 VIEW COMPARISON:  02/06/2016 FINDINGS: Low lung volumes with bibasilar atelectasis and vascular crowding. No  effusions. Heart is borderline in size. No acute bony abnormality. IMPRESSION: Low lung volumes with bibasilar atelectasis. Electronically Signed   By: Rolm Baptise M.D.   On: 02/15/2016 08:31    Microbiology: Recent Results (from the past 240 hour(s))  Blood culture (routine x 2)     Status: None (Preliminary result)   Collection Time: 02/14/16  8:27 PM  Result Value Ref Range Status   Specimen Description BLOOD RIGHT FOREARM  Final   Special Requests IN PEDIATRIC BOTTLE 1.5CC  Final   Culture NO GROWTH 3 DAYS  Final   Report Status PENDING  Incomplete  Blood culture (routine x 2)     Status: None (Preliminary result)   Collection Time: 02/14/16  8:38 PM  Result Value Ref Range Status   Specimen Description BLOOD RIGHT HAND  Final   Special Requests BOTTLES DRAWN AEROBIC AND ANAEROBIC 5CC EA  Final   Culture NO GROWTH 3 DAYS  Final   Report Status PENDING  Incomplete  MRSA PCR Screening     Status: None   Collection Time: 02/15/16  8:15 AM  Result Value Ref Range Status   MRSA by PCR NEGATIVE NEGATIVE Final  Comment:        The GeneXpert MRSA Assay (FDA approved for NASAL specimens only), is one component of a comprehensive MRSA colonization surveillance program. It is not intended to diagnose MRSA infection nor to guide or monitor treatment for MRSA infections.   Respiratory Panel by PCR     Status: None   Collection Time: 02/15/16  9:11 AM  Result Value Ref Range Status   Adenovirus NOT DETECTED NOT DETECTED Final   Coronavirus 229E NOT DETECTED NOT DETECTED Final   Coronavirus HKU1 NOT DETECTED NOT DETECTED Final   Coronavirus NL63 NOT DETECTED NOT DETECTED Final   Coronavirus OC43 NOT DETECTED NOT DETECTED Final   Metapneumovirus NOT DETECTED NOT DETECTED Final   Rhinovirus / Enterovirus NOT DETECTED NOT DETECTED Final   Influenza A NOT DETECTED NOT DETECTED Final   Influenza B NOT DETECTED NOT DETECTED Final   Parainfluenza Virus 1 NOT DETECTED NOT DETECTED Final    Parainfluenza Virus 2 NOT DETECTED NOT DETECTED Final   Parainfluenza Virus 3 NOT DETECTED NOT DETECTED Final   Parainfluenza Virus 4 NOT DETECTED NOT DETECTED Final   Respiratory Syncytial Virus NOT DETECTED NOT DETECTED Final   Bordetella pertussis NOT DETECTED NOT DETECTED Final   Chlamydophila pneumoniae NOT DETECTED NOT DETECTED Final   Mycoplasma pneumoniae NOT DETECTED NOT DETECTED Final     Labs: Basic Metabolic Panel:  Recent Labs Lab 02/14/16 1353 02/15/16 0530 02/16/16 0500 02/17/16 0529 02/18/16 0820  NA 131* 133* 134* 137 137  K 4.3 4.3 3.9 4.5 4.4  CL 102 106 108 110 111  CO2 21* 17* 22 21* 21*  GLUCOSE 161* 195* 121* 153* 108*  BUN 19 19 15 12 7   CREATININE 2.20* 2.16* 1.86* 1.63* 1.33*  CALCIUM 8.5* 7.3* 8.0* 8.1* 8.2*   Liver Function Tests:  Recent Labs Lab 02/14/16 1353  AST 16  ALT 8*  ALKPHOS 68  BILITOT 0.7  PROT 7.8  ALBUMIN 2.3*   No results for input(s): LIPASE, AMYLASE in the last 168 hours. No results for input(s): AMMONIA in the last 168 hours. CBC:  Recent Labs Lab 02/14/16 1353 02/15/16 0530 02/16/16 0500 02/17/16 0529  WBC 15.7* 26.1* 14.8* 8.4  NEUTROABS 13.3*  --  10.8*  --   HGB 8.9* 7.9* 7.1* 7.0*  HCT 28.4* 25.7* 23.0* 22.1*  MCV 74.0* 75.1* 74.9* 76.2*  PLT 331 266 265 255   Cardiac Enzymes: No results for input(s): CKTOTAL, CKMB, CKMBINDEX, TROPONINI in the last 168 hours. BNP: BNP (last 3 results) No results for input(s): BNP in the last 8760 hours.  ProBNP (last 3 results) No results for input(s): PROBNP in the last 8760 hours.  CBG:  Recent Labs Lab 02/17/16 1222 02/17/16 1637 02/17/16 2123 02/18/16 0809 02/18/16 1209  GLUCAP 103* 139* 133* 95 131*    Signed:  Velvet Bathe MD.  [facility] 02/18/2016, 3:14 PM

## 2016-02-19 LAB — CULTURE, BLOOD (ROUTINE X 2)
CULTURE: NO GROWTH
Culture: NO GROWTH

## 2016-10-21 ENCOUNTER — Encounter (HOSPITAL_COMMUNITY): Payer: Self-pay | Admitting: Emergency Medicine

## 2016-10-21 ENCOUNTER — Emergency Department (HOSPITAL_COMMUNITY): Payer: Self-pay

## 2016-10-21 ENCOUNTER — Emergency Department (HOSPITAL_COMMUNITY)
Admission: EM | Admit: 2016-10-21 | Discharge: 2016-10-21 | Disposition: A | Discharge status: Home / Self Care | Payer: Self-pay | Attending: Emergency Medicine | Admitting: Emergency Medicine

## 2016-10-21 DIAGNOSIS — R112 Nausea with vomiting, unspecified: Secondary | ICD-10-CM

## 2016-10-21 DIAGNOSIS — I10 Essential (primary) hypertension: Secondary | ICD-10-CM | POA: Insufficient documentation

## 2016-10-21 DIAGNOSIS — M79605 Pain in left leg: Secondary | ICD-10-CM | POA: Insufficient documentation

## 2016-10-21 DIAGNOSIS — E860 Dehydration: Secondary | ICD-10-CM | POA: Insufficient documentation

## 2016-10-21 DIAGNOSIS — E119 Type 2 diabetes mellitus without complications: Secondary | ICD-10-CM | POA: Insufficient documentation

## 2016-10-21 DIAGNOSIS — J45909 Unspecified asthma, uncomplicated: Secondary | ICD-10-CM | POA: Insufficient documentation

## 2016-10-21 DIAGNOSIS — Z79899 Other long term (current) drug therapy: Secondary | ICD-10-CM | POA: Insufficient documentation

## 2016-10-21 LAB — CBC WITH DIFFERENTIAL/PLATELET
BASOS PCT: 0 %
Basophils Absolute: 0 10*3/uL (ref 0.0–0.1)
Eosinophils Absolute: 0.2 10*3/uL (ref 0.0–0.7)
Eosinophils Relative: 3 %
HEMATOCRIT: 29.1 % — AB (ref 36.0–46.0)
Hemoglobin: 9.4 g/dL — ABNORMAL LOW (ref 12.0–15.0)
LYMPHS ABS: 1.9 10*3/uL (ref 0.7–4.0)
Lymphocytes Relative: 33 %
MCH: 24.8 pg — AB (ref 26.0–34.0)
MCHC: 32.3 g/dL (ref 30.0–36.0)
MCV: 76.8 fL — AB (ref 78.0–100.0)
MONO ABS: 0.6 10*3/uL (ref 0.1–1.0)
MONOS PCT: 10 %
NEUTROS ABS: 3.2 10*3/uL (ref 1.7–7.7)
Neutrophils Relative %: 54 %
PLATELETS: 221 10*3/uL (ref 150–400)
RBC: 3.79 MIL/uL — ABNORMAL LOW (ref 3.87–5.11)
RDW: 15.4 % (ref 11.5–15.5)
WBC: 5.8 10*3/uL (ref 4.0–10.5)

## 2016-10-21 LAB — COMPREHENSIVE METABOLIC PANEL
ALBUMIN: 3 g/dL — AB (ref 3.5–5.0)
ALT: 13 U/L — AB (ref 14–54)
AST: 19 U/L (ref 15–41)
Alkaline Phosphatase: 68 U/L (ref 38–126)
Anion gap: 7 (ref 5–15)
BUN: 27 mg/dL — ABNORMAL HIGH (ref 6–20)
CHLORIDE: 109 mmol/L (ref 101–111)
CO2: 20 mmol/L — AB (ref 22–32)
CREATININE: 2.03 mg/dL — AB (ref 0.44–1.00)
Calcium: 8.4 mg/dL — ABNORMAL LOW (ref 8.9–10.3)
GFR calc non Af Amer: 30 mL/min — ABNORMAL LOW (ref >60)
GFR, EST AFRICAN AMERICAN: 35 mL/min — AB (ref >60)
GLUCOSE: 149 mg/dL — AB (ref 65–99)
Potassium: 5 mmol/L (ref 3.5–5.1)
SODIUM: 136 mmol/L (ref 135–145)
Total Bilirubin: 0.4 mg/dL (ref 0.3–1.2)
Total Protein: 6.9 g/dL (ref 6.5–8.1)

## 2016-10-21 LAB — URINALYSIS, ROUTINE W REFLEX MICROSCOPIC
BACTERIA UA: NONE SEEN
Bilirubin Urine: NEGATIVE
GLUCOSE, UA: NEGATIVE mg/dL
HGB URINE DIPSTICK: NEGATIVE
KETONES UR: NEGATIVE mg/dL
Leukocytes, UA: NEGATIVE
NITRITE: NEGATIVE
Specific Gravity, Urine: 1.018 (ref 1.005–1.030)
pH: 6 (ref 5.0–8.0)

## 2016-10-21 LAB — POC URINE PREG, ED: Preg Test, Ur: NEGATIVE

## 2016-10-21 MED ORDER — MORPHINE SULFATE (PF) 4 MG/ML IV SOLN
4.0000 mg | Freq: Once | INTRAVENOUS | Status: AC
Start: 2016-10-21 — End: 2016-10-21
  Administered 2016-10-21: 4 mg via INTRAVENOUS
  Filled 2016-10-21: qty 1

## 2016-10-21 MED ORDER — METHOCARBAMOL 500 MG PO TABS: 500.0000 mg | ORAL_TABLET | Freq: Two times a day (BID) | ORAL | 0 refills | Status: DC

## 2016-10-21 MED ORDER — IBUPROFEN 800 MG PO TABS
800.0000 mg | ORAL_TABLET | Freq: Once | ORAL | Status: AC
  Administered 2016-10-21: 800 mg via ORAL
  Filled 2016-10-21: qty 1

## 2016-10-21 MED ORDER — ACETAMINOPHEN 325 MG PO TABS: 650.0000 mg | ORAL_TABLET | Freq: Four times a day (QID) | ORAL | 0 refills | Status: DC | PRN

## 2016-10-21 MED ORDER — DIPHENHYDRAMINE HCL 50 MG/ML IJ SOLN
25.0000 mg | Freq: Once | INTRAMUSCULAR | Status: AC
  Administered 2016-10-21: 25 mg via INTRAVENOUS
  Filled 2016-10-21: qty 1

## 2016-10-21 MED ORDER — METOCLOPRAMIDE HCL 5 MG/ML IJ SOLN
10.0000 mg | Freq: Once | INTRAMUSCULAR | Status: AC
  Administered 2016-10-21: 10 mg via INTRAVENOUS
  Filled 2016-10-21: qty 2

## 2016-10-21 MED ORDER — ONDANSETRON 4 MG PO TBDP
4.0000 mg | ORAL_TABLET | Freq: Once | ORAL | Status: AC
  Administered 2016-10-21: 4 mg via ORAL
  Filled 2016-10-21: qty 1

## 2016-10-21 MED ORDER — ONDANSETRON 4 MG PO TBDP: ORAL_TABLET | ORAL | 0 refills | Status: DC

## 2016-10-21 MED ORDER — ONDANSETRON HCL 4 MG/2ML IJ SOLN
4.0000 mg | Freq: Once | INTRAMUSCULAR | Status: AC
  Administered 2016-10-21: 4 mg via INTRAMUSCULAR
  Filled 2016-10-21: qty 2

## 2016-10-21 MED ORDER — SODIUM CHLORIDE 0.9 % IV BOLUS (SEPSIS)
1000.0000 mL | Freq: Once | INTRAVENOUS | Status: AC
  Administered 2016-10-21: 1000 mL via INTRAVENOUS

## 2016-10-21 NOTE — ED Notes (Signed)
When pt got to xray she states  She might be preg, pt brought back to room for preg test

## 2016-10-21 NOTE — Discharge Instructions (Signed)
Follow-up to have your kidney function rechecked tomorrow or Saturday. You can use Tylenol and Robaxin for muscle pain and Zofran for nausea. Try and drink lots of fluids at home, and eat small frequent meals to help with nausea. If you develop fevers or chills, the painful area on your leg becomes red or hot please return to the emergency department immediately for further evaluation. Please use the resources provided to establish care with the family medicine doctor, to get restarted on your blood pressure medication and iron supplements fever previously taking.

## 2016-10-21 NOTE — ED Provider Notes (Signed)
[facility] DEPT Provider Note   CSN: [id number] Arrival date & time: 10/21/16  6546     History   Chief Complaint Chief Complaint  Patient presents with  . Leg Pain    HPI  [name] is a 36 y.o. female with a history of asthma, diabetes, anxiety, hypertension, who presents with 2 days of persistent nausea and vomiting and left thigh pain. Patient reports left thigh pain started last night and initially felt like tightness or muscle spasm, but now is just a constant throbbing soreness on the left inner thigh. Pt reports difficulty bearing weight on left leg due to pain. Denies any inciting injury, no redness or warmth to area. Pt reports previous pain in left shoulder over the past 3 weeks that has since resolved. Pt has not tried any medication. Pt deneis leg swelling, no SOB, cough or hemoptysis. No recent surgeries or long distance travel. Pt also complaining of 2 days of persistent nausea with a few episodes of non-bloody vomiting, pt has been unable to keep anything down and feels dehydrated. Pt denies abdominal pain, or diarrhea. Pt denies urinary symptoms.Pt does have a history of marijuana use. Pt does not have a regular doctor and has hx of HTN and anemia but does not take any home medications.       Past Medical History:  Diagnosis Date  . Anxiety   . Asthma   . Depression   . Diabetes mellitus   . Hyperemesis arising during pregnancy   . Hypertension   . Mental disorder     Patient Active Problem List   Diagnosis Date Noted  . Sepsis ([place]) 02/15/2016  . Generalized abdominal pain 02/15/2016  . CAP (community acquired pneumonia) 02/14/2016  . 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 Back pain 07/28/2011  . H. pylori infection 11/24/2010  . Depression 11/20/2010  . Asthma 11/20/2010  . Chronic hypertension in pregnancy 11/08/2010  . Type 2 diabetes mellitus ([place]) 11/07/2010    Past Surgical History:  Procedure Laterality Date  . NO PAST SURGERIES    .  TUBAL LIGATION  06/20/2011   Procedure: POST PARTUM TUBAL LIGATION;  Surgeon: Mora Bellman, MD;  Location: [facility];  Service: Gynecology;  Laterality: Bilateral;  Bilateral post partum tubal ligation    OB History    Gravida Para Term Preterm AB Living   4 3 2 1 1 3    SAB TAB Ectopic Multiple Live Births   0 0 1 0 3       Home Medications    Prior to Admission medications   Medication Sig Start Date End Date Taking? Authorizing Provider  amLODipine (NORVASC) 5 MG tablet Take 1 tablet (5 mg total) by mouth daily. 02/18/16   Velvet Bathe, MD  benzonatate (TESSALON PERLES) 100 MG capsule Take 1 capsule (100 mg total) by mouth 3 (three) times daily as needed for cough. Patient not taking: Reported on 02/14/2016 02/06/16   Mackuen, Courteney Lyn, MD  ferrous sulfate 325 (65 FE) MG tablet Take 1 tablet (325 mg total) by mouth 3 (three) times daily with meals. 02/18/16   Velvet Bathe, MD    Family History Family History  Problem Relation Age of Onset  . Heart disease Mother        CHF  . Diabetes Sister   . Anesthesia problems Neg Hx     Social History Social History  Substance Use Topics  . Smoking status: Never Smoker  . Smokeless tobacco: Never Used  .  Alcohol use No     Comment: occassionally     Allergies   Shellfish allergy and Morphine and related   Review of Systems Review of Systems  Constitutional: Positive for appetite change. Negative for chills and fever.  Eyes: Negative for visual disturbance.  Respiratory: Negative for cough, chest tightness and shortness of breath.   Cardiovascular: Negative for chest pain.  Gastrointestinal: Positive for nausea and vomiting. Negative for abdominal pain, blood in stool and diarrhea.  Genitourinary: Negative for dysuria.  Musculoskeletal: Positive for gait problem and myalgias (L inner thigh).  Skin: Negative for color change, rash and wound.  Neurological: Negative for weakness and numbness.     Physical Exam Updated  Vital Signs BP (!) 152/90 (BP Location: Left Arm)   Pulse 89   Temp 98 F (36.7 C) (Oral)   Resp 20   Ht 5\' 7"  (1.702 m)   Wt 88.9 kg (196 lb)   SpO2 100%   BMI 30.70 kg/m   Physical Exam  Constitutional: She appears well-developed and well-nourished. No distress.  HENT:  Head: Normocephalic and atraumatic.  Eyes: Right eye exhibits no discharge. Left eye exhibits no discharge.  Cardiovascular: Normal rate, regular rhythm, normal heart sounds and intact distal pulses.   Pulmonary/Chest: Effort normal and breath sounds normal. No respiratory distress. She has no wheezes. She has no rales.  Abdominal: Soft. Bowel sounds are normal. She exhibits no distension and no mass. There is no tenderness. There is no guarding.  Musculoskeletal: She exhibits no edema.  TTP over inner left thigh, no erythema or warmth, no area of fluctuance or erythema, pt able to move, but complains of pain, no distal swelling, distal pulses 2+ at DP and PT, sensation intact, good strength with dorsiflexion and plantarflexion  Neurological: She is alert. Coordination normal.  Speech is clear, able to follow commands CN III-XII intact Normal strength in upper and lower extremities bilaterally including dorsiflexion and plantar flexion, strong and equal grip strength Sensation normal to light and sharp touch Moves extremities without ataxia, coordination intact  Skin: Skin is warm and dry. Capillary refill takes less than 2 seconds. No rash noted. She is not diaphoretic. No erythema.  Psychiatric: She has a normal mood and affect. Her behavior is normal.  Nursing note and vitals reviewed.    ED Treatments / Results  Labs (all labs ordered are listed, but only abnormal results are displayed) Labs Reviewed  CBC WITH DIFFERENTIAL/PLATELET - Abnormal; Notable for the following:       Result Value   RBC 3.79 (*)    Hemoglobin 9.4 (*)    HCT 29.1 (*)    MCV 76.8 (*)    MCH 24.8 (*)    All other components  within normal limits  COMPREHENSIVE METABOLIC PANEL - Abnormal; Notable for the following:    CO2 20 (*)    Glucose, Bld 149 (*)    BUN 27 (*)    Creatinine, Ser 2.03 (*)    Calcium 8.4 (*)    Albumin 3.0 (*)    ALT 13 (*)    GFR calc non Af Amer 30 (*)    GFR calc Af Amer 35 (*)    All other components within normal limits  URINALYSIS, ROUTINE W REFLEX MICROSCOPIC - Abnormal; Notable for the following:    Protein, ur >=300 (*)    Squamous Epithelial / LPF 0-5 (*)    All other components within normal limits  POC URINE PREG, ED  EKG  EKG Interpretation None       Radiology Dg Pelvis 1-2 Views  Result Date: 10/21/2016 CLINICAL DATA:  36 year old female with history of left-sided pain in the left hip and groin area since yesterday evening. EXAM: PELVIS - 1-2 VIEW COMPARISON:  No priors. FINDINGS: There is no evidence of pelvic fracture or diastasis. No pelvic bone lesions are seen. Mild joint space narrowing, subchondral sclerosis and osteophyte formation noted in the hip joints bilaterally, indicative of mild osteoarthritis. IMPRESSION: 1. No acute radiographic abnormality of the bony pelvis or either hip. 2. Mild bilateral hip joint osteoarthritis. Electronically Signed   By: Vinnie Langton M.D.   On: 10/21/2016 12:17   Dg Femur Min 2 Views Left  Result Date: 10/21/2016 CLINICAL DATA:  Onset of left-sided groin and inter hip pain last night. No known injury. Pain made worse when bending or rotating the left hip. EXAM: LEFT FEMUR 2 VIEWS COMPARISON:  AP pelvis of today's date and coronal and sagittal CT images through the pelvis and hips from February 14, 2016 FINDINGS: The bones are subjectively adequately mineralized. There is moderate asymmetric narrowing of the left hip joint space. The articular surfaces of the femoral head and acetabulum remains smoothly rounded. The femoral neck, intertrochanteric region, and femoral shaft are unremarkable. The observed portions of the knee  exhibit no acute abnormalities. IMPRESSION: There is moderate asymmetric narrowing of the left hip joint space consistent with osteoarthritis. There is no acute bony abnormality. Electronically Signed   By: David  Martinique M.D.   On: 10/21/2016 12:20    Procedures Procedures (including critical care time)  Medications Ordered in ED Medications  ondansetron (ZOFRAN-ODT) disintegrating tablet 4 mg (4 mg Oral Given 10/21/16 0941)  ibuprofen (ADVIL,MOTRIN) tablet 800 mg (800 mg Oral Given 10/21/16 0941)  ondansetron (ZOFRAN) injection 4 mg (4 mg Intramuscular Given 10/21/16 0954)  sodium chloride 0.9 % bolus 1,000 mL (0 mLs Intravenous Stopped 10/21/16 1527)  morphine 4 MG/ML injection 4 mg (4 mg Intravenous Given 10/21/16 1300)  metoCLOPramide (REGLAN) injection 10 mg (10 mg Intravenous Given 10/21/16 1300)  diphenhydrAMINE (BENADRYL) injection 25 mg (25 mg Intravenous Given 10/21/16 1300)     Initial Impression / Assessment and Plan / ED Course  I have reviewed the triage vital signs and the nursing notes.  Pertinent labs & imaging results that were available during my care of the patient were reviewed by me and considered in my medical decision making (see chart for details).  Pt presents with L inner thigh pain and nausea/vomiting. Pt non-toxic appearing, mildly hypertensive vitals otherwise normal. No clear etiology of inner thigh pain, no evidence of abscess or cellulitis, XR of femur and pelvis show changes consistent with OA, no acute fracture or bony abnormality. Possibly caused by muscle spasm. Low suspicion of DVT. Pain and mobility improved with pain medication.   Pt with 2 days N/V, abdominal exam benign, Cr elevated to 2.03 from 1.33 8 months ago, BUN also elevated, pt likely dehydrated in setting vomiting, UA shows no infection, CBC unremarkable, Hgb improved from previous, pt not taking iron supplements as prescribed. No electrolyte derangements requiring intervention. Will give fluids  and nauseas medication and give PO challenge.  On re-eval pain improved. Reglan worked best for pt nausea, pt able to tolerate fluids and crackers. Will discharge home with nausea and pain medication. Pt given resources to establish primary care provider. Pt will need to be seen in 2 days at the latest to have kidney function  rechecked to ensure improvement. Return precautions provided. Pt and family express understanding and agree with plan.  Patient discussed with Dr. Ashok Cordia, who saw patient as well and agrees with plan.   Final Clinical Impressions(s) / ED Diagnoses   Final diagnoses:  Left leg pain  Non-intractable vomiting with nausea, unspecified vomiting type  Dehydration    New Prescriptions Discharge Medication List as of 10/21/2016  3:12 PM    START taking these medications   Details  acetaminophen (TYLENOL) 325 MG tablet Take 2 tablets (650 mg total) by mouth every 6 (six) hours as needed., Starting Thu 10/21/2016, Print    methocarbamol (ROBAXIN) 500 MG tablet Take 1 tablet (500 mg total) by mouth 2 (two) times daily., Starting Thu 10/21/2016, Print    ondansetron (ZOFRAN ODT) 4 MG disintegrating tablet 4mg  ODT q4 hours prn nausea/vomit, Print         Janet Berlin 10/21/16 2353    Lajean Saver, MD 10/22/16 [id number]

## 2016-10-21 NOTE — ED Notes (Signed)
Patient transported to X-ray 

## 2016-10-21 NOTE — ED Notes (Signed)
Pt retched up zofran

## 2016-10-21 NOTE — ED Triage Notes (Signed)
Per pt, coming in from home with complaints of left leg pain starting this morning. Pt stated that they have had left shoulder pain  For three weeks. Pt has been nauseous since this morning. A & O x4. Pt denies any past injures to the left leg.

## 2017-02-06 ENCOUNTER — Other Ambulatory Visit: Payer: Self-pay

## 2017-02-06 ENCOUNTER — Encounter (HOSPITAL_COMMUNITY): Payer: Self-pay | Admitting: *Deleted

## 2017-02-06 ENCOUNTER — Emergency Department (HOSPITAL_COMMUNITY)
Admission: EM | Admit: 2017-02-06 | Discharge: 2017-02-06 | Disposition: A | Discharge status: Home / Self Care | Payer: Self-pay | Attending: Emergency Medicine | Admitting: Emergency Medicine

## 2017-02-06 ENCOUNTER — Emergency Department (HOSPITAL_COMMUNITY): Payer: Self-pay

## 2017-02-06 DIAGNOSIS — J45909 Unspecified asthma, uncomplicated: Secondary | ICD-10-CM | POA: Insufficient documentation

## 2017-02-06 DIAGNOSIS — B349 Viral infection, unspecified: Secondary | ICD-10-CM | POA: Insufficient documentation

## 2017-02-06 DIAGNOSIS — I10 Essential (primary) hypertension: Secondary | ICD-10-CM | POA: Insufficient documentation

## 2017-02-06 DIAGNOSIS — E119 Type 2 diabetes mellitus without complications: Secondary | ICD-10-CM | POA: Insufficient documentation

## 2017-02-06 DIAGNOSIS — Z79899 Other long term (current) drug therapy: Secondary | ICD-10-CM | POA: Insufficient documentation

## 2017-02-06 DIAGNOSIS — R112 Nausea with vomiting, unspecified: Secondary | ICD-10-CM | POA: Insufficient documentation

## 2017-02-06 DIAGNOSIS — R51 Headache: Secondary | ICD-10-CM | POA: Insufficient documentation

## 2017-02-06 DIAGNOSIS — R05 Cough: Secondary | ICD-10-CM | POA: Insufficient documentation

## 2017-02-06 LAB — COMPREHENSIVE METABOLIC PANEL
ALBUMIN: 2.9 g/dL — AB (ref 3.5–5.0)
ALT: 12 U/L — ABNORMAL LOW (ref 14–54)
ANION GAP: 8 (ref 5–15)
AST: 16 U/L (ref 15–41)
Alkaline Phosphatase: 79 U/L (ref 38–126)
BUN: 20 mg/dL (ref 6–20)
CALCIUM: 8.5 mg/dL — AB (ref 8.9–10.3)
CO2: 20 mmol/L — AB (ref 22–32)
Chloride: 108 mmol/L (ref 101–111)
Creatinine, Ser: 1.76 mg/dL — ABNORMAL HIGH (ref 0.44–1.00)
GFR calc non Af Amer: 35 mL/min — ABNORMAL LOW (ref >60)
GFR, EST AFRICAN AMERICAN: 41 mL/min — AB (ref >60)
Glucose, Bld: 251 mg/dL — ABNORMAL HIGH (ref 65–99)
POTASSIUM: 4.9 mmol/L (ref 3.5–5.1)
SODIUM: 136 mmol/L (ref 135–145)
Total Bilirubin: 0.2 mg/dL — ABNORMAL LOW (ref 0.3–1.2)
Total Protein: 7 g/dL (ref 6.5–8.1)

## 2017-02-06 LAB — I-STAT BETA HCG BLOOD, ED (MC, WL, AP ONLY)

## 2017-02-06 LAB — CBC
HEMATOCRIT: 28.1 % — AB (ref 36.0–46.0)
HEMOGLOBIN: 8.8 g/dL — AB (ref 12.0–15.0)
MCH: 24.3 pg — AB (ref 26.0–34.0)
MCHC: 31.3 g/dL (ref 30.0–36.0)
MCV: 77.6 fL — ABNORMAL LOW (ref 78.0–100.0)
Platelets: 278 10*3/uL (ref 150–400)
RBC: 3.62 MIL/uL — ABNORMAL LOW (ref 3.87–5.11)
RDW: 14.9 % (ref 11.5–15.5)
WBC: 5.5 10*3/uL (ref 4.0–10.5)

## 2017-02-06 LAB — LIPASE, BLOOD: LIPASE: 38 U/L (ref 11–51)

## 2017-02-06 LAB — I-STAT CG4 LACTIC ACID, ED: Lactic Acid, Venous: 0.98 mmol/L (ref 0.5–1.9)

## 2017-02-06 MED ORDER — HYDROCODONE-ACETAMINOPHEN 5-325 MG PO TABS
1.0000 | ORAL_TABLET | Freq: Once | ORAL | Status: AC
  Administered 2017-02-06: 1 via ORAL
  Filled 2017-02-06: qty 1

## 2017-02-06 MED ORDER — ONDANSETRON 4 MG PO TBDP: 4.0000 mg | ORAL_TABLET | Freq: Three times a day (TID) | ORAL | 0 refills | Status: DC | PRN

## 2017-02-06 MED ORDER — TRAMADOL HCL 50 MG PO TABS: 50.0000 mg | ORAL_TABLET | Freq: Four times a day (QID) | ORAL | 0 refills | Status: DC | PRN

## 2017-02-06 MED ORDER — ONDANSETRON HCL 4 MG/2ML IJ SOLN
4.0000 mg | Freq: Once | INTRAMUSCULAR | Status: AC
  Administered 2017-02-06: 4 mg via INTRAVENOUS
  Filled 2017-02-06: qty 2

## 2017-02-06 MED ORDER — SODIUM CHLORIDE 0.9 % IV BOLUS (SEPSIS)
1000.0000 mL | Freq: Once | INTRAVENOUS | Status: AC
  Administered 2017-02-06: 1000 mL via INTRAVENOUS

## 2017-02-06 MED ORDER — ONDANSETRON 4 MG PO TBDP
8.0000 mg | ORAL_TABLET | Freq: Once | ORAL | Status: AC
  Administered 2017-02-06: 8 mg via ORAL
  Filled 2017-02-06: qty 2

## 2017-02-06 MED ORDER — FENTANYL CITRATE (PF) 100 MCG/2ML IJ SOLN
75.0000 ug | Freq: Once | INTRAMUSCULAR | Status: AC
  Administered 2017-02-06: 75 ug via INTRAVENOUS
  Filled 2017-02-06: qty 2

## 2017-02-06 MED ORDER — PROMETHAZINE-DM 6.25-15 MG/5ML PO SYRP: 5.0000 mL | ORAL_SOLUTION | Freq: Four times a day (QID) | ORAL | 0 refills | Status: DC | PRN

## 2017-02-06 NOTE — Discharge Instructions (Signed)
Nausea.   Phenergan-DM for cough  Ultram for pain.   Push fluids to stay hydrated.

## 2017-02-06 NOTE — ED Provider Notes (Signed)
[facility] Provider Note   CSN: [id number] Arrival date & time: 02/06/17  7371     History   Chief Complaint Chief Complaint  Patient presents with  . Emesis  . Influenza    HPI [name] is a 37 y.o. female.  Chief complaint is "I think I am dying".  HPI 37 year old female.  5 days of symptoms.  Cough and body aches.  Chills.  Was not vaccinated for influenza.  Occasional cough.  States she was "just writing it out".  But started vomiting last night.  Last emesis was several hours ago.  Headache.  No sinus pain or pressure.  Does not feel short of breath.  Frequent cough, dry.  No diarrhea.  Past Medical History:  Diagnosis Date  . Anxiety   . Asthma   . Depression   . Diabetes mellitus   . Hyperemesis arising during pregnancy   . Hypertension   . Mental disorder     Patient Active Problem List   Diagnosis Date Noted  . Sepsis ([place]) 02/15/2016  . Generalized abdominal pain 02/15/2016  . CAP (community acquired pneumonia) 02/14/2016  . 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 Back pain 07/28/2011  . H. pylori infection 11/24/2010  . Depression 11/20/2010  . Asthma 11/20/2010  . Chronic hypertension in pregnancy 11/08/2010  . Type 2 diabetes mellitus ([place]) 11/07/2010    Past Surgical History:  Procedure Laterality Date  . NO PAST SURGERIES    . TUBAL LIGATION  06/20/2011   Procedure: POST PARTUM TUBAL LIGATION;  Surgeon: Mora Bellman, MD;  Location: [facility];  Service: Gynecology;  Laterality: Bilateral;  Bilateral post partum tubal ligation    OB History    Gravida Para Term Preterm AB Living   4 3 2 1 1 3    SAB TAB Ectopic Multiple Live Births   0 0 1 0 3       Home Medications    Prior to Admission medications   Medication Sig Start Date End Date Taking? Authorizing Provider  acetaminophen (TYLENOL) 325 MG tablet Take 2 tablets (650 mg total) by mouth every 6 (six) hours as needed. 10/21/16   Jacqlyn Larsen,  PA-C  amLODipine (NORVASC) 5 MG tablet Take 1 tablet (5 mg total) by mouth daily. 02/18/16   Velvet Bathe, MD  benzonatate (TESSALON PERLES) 100 MG capsule Take 1 capsule (100 mg total) by mouth 3 (three) times daily as needed for cough. Patient not taking: Reported on 02/14/2016 02/06/16   Mackuen, Courteney Lyn, MD  ferrous sulfate 325 (65 FE) MG tablet Take 1 tablet (325 mg total) by mouth 3 (three) times daily with meals. 02/18/16   Velvet Bathe, MD  methocarbamol (ROBAXIN) 500 MG tablet Take 1 tablet (500 mg total) by mouth 2 (two) times daily. 10/21/16   Jacqlyn Larsen, PA-C  ondansetron (ZOFRAN ODT) 4 MG disintegrating tablet Take 1 tablet (4 mg total) by mouth every 8 (eight) hours as needed for nausea. 02/06/17   Tanna Furry, MD  promethazine-dextromethorphan (PROMETHAZINE-DM) 6.25-15 MG/5ML syrup Take 5 mLs by mouth 4 (four) times daily as needed for cough. 02/06/17   Tanna Furry, MD  traMADol (ULTRAM) 50 MG tablet Take 1 tablet (50 mg total) by mouth every 6 (six) hours as needed. 02/06/17   Tanna Furry, MD    Family History Family History  Problem Relation Age of Onset  . Heart disease Mother        CHF  .  Diabetes Sister   . Anesthesia problems Neg Hx     Social History Social History   Tobacco Use  . Smoking status: Never Smoker  . Smokeless tobacco: Never Used  Substance Use Topics  . Alcohol use: No    Comment: occassionally  . Drug use: No     Allergies   Shellfish allergy and Morphine and related   Review of Systems Review of Systems  Constitutional: Positive for chills. Negative for appetite change, diaphoresis, fatigue and fever.  HENT: Negative for mouth sores, sore throat and trouble swallowing.   Eyes: Negative for visual disturbance.  Respiratory: Positive for cough. Negative for chest tightness, shortness of breath and wheezing.   Cardiovascular: Negative for chest pain.  Gastrointestinal: Positive for nausea and vomiting. Negative for abdominal  distention, abdominal pain and diarrhea.  Endocrine: Negative for polydipsia, polyphagia and polyuria.  Genitourinary: Negative for dysuria, frequency and hematuria.  Musculoskeletal: Positive for myalgias. Negative for gait problem.  Skin: Negative for color change, pallor and rash.  Neurological: Positive for headaches. Negative for dizziness, syncope and light-headedness.  Hematological: Does not bruise/bleed easily.  Psychiatric/Behavioral: Negative for behavioral problems and confusion.     Physical Exam Updated Vital Signs BP (!) 181/104 (BP Location: Right Arm)   Pulse 79   Temp 98.4 F (36.9 C) (Oral)   Resp 14   Ht 5\' 7"  (1.702 m)   Wt 99.8 kg (220 lb)   LMP 01/29/2017   SpO2 100%   BMI 34.46 kg/m   Physical Exam  Constitutional: She is oriented to person, place, and time. She appears well-developed and well-nourished. No distress.  HENT:  Head: Normocephalic.  Eyes: Conjunctivae are normal. Pupils are equal, round, and reactive to light. No scleral icterus.  Neck: Normal range of motion. Neck supple. No thyromegaly present.  Cardiovascular: Normal rate and regular rhythm. Exam reveals no gallop and no friction rub.  No murmur heard. Pulmonary/Chest: Effort normal and breath sounds normal. No respiratory distress. She has no wheezes. She has no rales.  Abdominal: Soft. Bowel sounds are normal. She exhibits no distension. There is no tenderness. There is no rebound.  Musculoskeletal: Normal range of motion.  Neurological: She is alert and oriented to person, place, and time.  Skin: Skin is warm and dry. No rash noted.  Psychiatric: She has a normal mood and affect. Her behavior is normal.     ED Treatments / Results  Labs (all labs ordered are listed, but only abnormal results are displayed) Labs Reviewed  COMPREHENSIVE METABOLIC PANEL - Abnormal; Notable for the following components:      Result Value   CO2 20 (*)    Glucose, Bld 251 (*)    Creatinine, Ser  1.76 (*)    Calcium 8.5 (*)    Albumin 2.9 (*)    ALT 12 (*)    Total Bilirubin 0.2 (*)    GFR calc non Af Amer 35 (*)    GFR calc Af Amer 41 (*)    All other components within normal limits  CBC - Abnormal; Notable for the following components:   RBC 3.62 (*)    Hemoglobin 8.8 (*)    HCT 28.1 (*)    MCV 77.6 (*)    MCH 24.3 (*)    All other components within normal limits  LIPASE, BLOOD  URINALYSIS, ROUTINE W REFLEX MICROSCOPIC  I-STAT BETA HCG BLOOD, ED ([facility], [facility], AP ONLY)  I-STAT CG4 LACTIC ACID, ED    EKG  EKG Interpretation None       Radiology Dg Chest 2 View  Result Date: 02/06/2017 CLINICAL DATA:  37 year old with cough and fever. EXAM: CHEST  2 VIEW COMPARISON:  02/15/2016 FINDINGS: Mild fullness along the right mediastinal border is likely secondary to the AP sitting technique. Heart size is within normal limits. Both lungs are clear. No pleural effusions. No acute bone abnormality. IMPRESSION: No active cardiopulmonary disease. Electronically Signed   By: Markus Daft M.D.   On: 02/06/2017 13:15    Procedures Procedures (including critical care time)  Medications Ordered in ED Medications  fentaNYL (SUBLIMAZE) injection 75 mcg (not administered)  HYDROcodone-acetaminophen (NORCO/VICODIN) 5-325 MG per tablet 1 tablet (1 tablet Oral Given 02/06/17 1209)  ondansetron (ZOFRAN-ODT) disintegrating tablet 8 mg (8 mg Oral Given 02/06/17 1208)  sodium chloride 0.9 % bolus 1,000 mL (1,000 mLs Intravenous New Bag/Given 02/06/17 1351)  ondansetron (ZOFRAN) injection 4 mg (4 mg Intravenous Given 02/06/17 1349)     Initial Impression / Assessment and Plan / ED Course  I have reviewed the triage vital signs and the nursing notes.  Pertinent labs & imaging results that were available during my care of the patient were reviewed by me and considered in my medical decision making (see chart for details).     Labs showing hemoglobin, and mild azotemia both at baseline..  Given  Zofran.  Single cough and myalgias.  X-ray pending.  If negative will treat symptomatically for probable viral syndrome and possible influenza.  She is out of the window for antivirals.  Final Clinical Impressions(s) / ED Diagnoses   Final diagnoses:  Viral syndrome    Had emesis after p.o. Zofran.  IV placed.  Given fluids.  Taking p.o.  Still complaining of headache.  Given IV pain meds.  Plan discharge.  Phenergan DM, Zofran, tramadol, rest, fluids, primary care follow-up.  ED Discharge Orders        Ordered    ondansetron (ZOFRAN ODT) 4 MG disintegrating tablet  Every 8 hours PRN     02/06/17 1439    traMADol (ULTRAM) 50 MG tablet  Every 6 hours PRN     02/06/17 1439    promethazine-dextromethorphan (PROMETHAZINE-DM) 6.25-15 MG/5ML syrup  4 times daily PRN     02/06/17 1439       Tanna Furry, MD 02/06/17 1441

## 2017-02-06 NOTE — ED Notes (Signed)
IV Team at bedside 

## 2017-02-06 NOTE — ED Notes (Signed)
Attempted to go over discharge paperwork with patient and she answered the phone and turned her head away from me. Upon suggestion that this would take just a minute patient ignored me and continued talking on phone.

## 2017-02-06 NOTE — ED Triage Notes (Signed)
Pt reports bodyaches, headache, sore throat, cough, fever x 1 week. Now has n/v since last night. Mask on pt at triage.

## 2017-10-09 ENCOUNTER — Emergency Department (HOSPITAL_COMMUNITY)
Admission: EM | Admit: 2017-10-09 | Discharge: 2017-10-09 | Disposition: A | Discharge status: Home / Self Care | Payer: Self-pay | Attending: Emergency Medicine | Admitting: Emergency Medicine

## 2017-10-09 ENCOUNTER — Other Ambulatory Visit: Payer: Self-pay

## 2017-10-09 ENCOUNTER — Emergency Department (HOSPITAL_COMMUNITY): Payer: Self-pay

## 2017-10-09 ENCOUNTER — Encounter (HOSPITAL_COMMUNITY): Payer: Self-pay | Admitting: Emergency Medicine

## 2017-10-09 DIAGNOSIS — F419 Anxiety disorder, unspecified: Secondary | ICD-10-CM | POA: Insufficient documentation

## 2017-10-09 DIAGNOSIS — Y929 Unspecified place or not applicable: Secondary | ICD-10-CM | POA: Insufficient documentation

## 2017-10-09 DIAGNOSIS — Z79899 Other long term (current) drug therapy: Secondary | ICD-10-CM | POA: Insufficient documentation

## 2017-10-09 DIAGNOSIS — Y990 Civilian activity done for income or pay: Secondary | ICD-10-CM | POA: Insufficient documentation

## 2017-10-09 DIAGNOSIS — Y9389 Activity, other specified: Secondary | ICD-10-CM | POA: Insufficient documentation

## 2017-10-09 DIAGNOSIS — S8001XA Contusion of right knee, initial encounter: Secondary | ICD-10-CM | POA: Insufficient documentation

## 2017-10-09 DIAGNOSIS — F329 Major depressive disorder, single episode, unspecified: Secondary | ICD-10-CM | POA: Insufficient documentation

## 2017-10-09 DIAGNOSIS — E119 Type 2 diabetes mellitus without complications: Secondary | ICD-10-CM | POA: Insufficient documentation

## 2017-10-09 DIAGNOSIS — J45909 Unspecified asthma, uncomplicated: Secondary | ICD-10-CM | POA: Insufficient documentation

## 2017-10-09 DIAGNOSIS — W228XXA Striking against or struck by other objects, initial encounter: Secondary | ICD-10-CM | POA: Insufficient documentation

## 2017-10-09 DIAGNOSIS — I10 Essential (primary) hypertension: Secondary | ICD-10-CM | POA: Insufficient documentation

## 2017-10-09 NOTE — ED Provider Notes (Signed)
[facility] DEPARTMENT Provider Note   CSN: [id number] Arrival date & time: 10/09/17  1696     History   Chief Complaint Chief Complaint  Patient presents with  . Knee Pain    HPI Zina Pitzer is a 37 y.o. female.  37 year old female presents with complaint of right knee pain.  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  Patient was ambulatory after the injury without any difficulty but woke up today with pain in the knee and pain with walking.  No other injuries, complaints, concerns.     Past Medical History:  Diagnosis Date  . Anxiety   . Asthma   . Depression   . Diabetes mellitus   . Hyperemesis arising during pregnancy   . Hypertension   . Mental disorder     Patient Active Problem List   Diagnosis Date Noted  . Sepsis ([place]) 02/15/2016  . Generalized abdominal pain 02/15/2016  . CAP (community acquired pneumonia) 02/14/2016  . 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 Back pain 07/28/2011  . H. pylori infection 11/24/2010  . Depression 11/20/2010  . Asthma 11/20/2010  . Chronic hypertension in pregnancy 11/08/2010  . Type 2 diabetes mellitus ([place]) 11/07/2010    Past Surgical History:  Procedure Laterality Date  . NO PAST SURGERIES    . TUBAL LIGATION  06/20/2011   Procedure: POST PARTUM TUBAL LIGATION;  Surgeon: Mora Bellman, MD;  Location: [facility];  Service: Gynecology;  Laterality: Bilateral;  Bilateral post partum tubal ligation     OB History    Gravida  4   Para  3   Term  2   Preterm  1   AB  1   Living  3     SAB  0   TAB  0   Ectopic  1   Multiple  0   Live Births  3            Home Medications    Prior to Admission medications   Medication Sig Start Date End Date Taking? Authorizing Provider  acetaminophen (TYLENOL) 325 MG tablet Take 2 tablets (650 mg total) by mouth every 6 (six) hours as needed. 10/21/16    Jacqlyn Larsen, PA-C  amLODipine (NORVASC) 5 MG tablet Take 1 tablet (5 mg total) by mouth daily. 02/18/16   Velvet Bathe, MD  benzonatate (TESSALON PERLES) 100 MG capsule Take 1 capsule (100 mg total) by mouth 3 (three) times daily as needed for cough. Patient not taking: Reported on 02/14/2016 02/06/16   Mackuen, Courteney Lyn, MD  ferrous sulfate 325 (65 FE) MG tablet Take 1 tablet (325 mg total) by mouth 3 (three) times daily with meals. 02/18/16   Velvet Bathe, MD  methocarbamol (ROBAXIN) 500 MG tablet Take 1 tablet (500 mg total) by mouth 2 (two) times daily. 10/21/16   Jacqlyn Larsen, PA-C  ondansetron (ZOFRAN ODT) 4 MG disintegrating tablet Take 1 tablet (4 mg total) by mouth every 8 (eight) hours as needed for nausea. 02/06/17   Tanna Furry, MD  promethazine-dextromethorphan (PROMETHAZINE-DM) 6.25-15 MG/5ML syrup Take 5 mLs by mouth 4 (four) times daily as needed for cough. 02/06/17   Tanna Furry, MD  traMADol (ULTRAM) 50 MG tablet Take 1 tablet (50 mg total) by mouth every 6 (six) hours as needed. 02/06/17   Tanna Furry, MD  escitalopram (LEXAPRO) 20 MG tablet Take  1 tablet (20 mg total) by mouth daily. 01/25/11 04/13/11  Rosezella Rumpf, CNM    Family History Family History  Problem Relation Age of Onset  . Heart disease Mother        CHF  . Diabetes Sister   . Anesthesia problems Neg Hx     Social History Social History   Tobacco Use  . Smoking status: Never Smoker  . Smokeless tobacco: Never Used  Substance Use Topics  . Alcohol use: No    Comment: occassionally  . Drug use: No    Types: Cocaine, Marijuana     Allergies   Shellfish allergy and Morphine and related   Review of Systems Review of Systems  Constitutional: Negative for fever.  Musculoskeletal: Positive for arthralgias and gait problem. Negative for joint swelling.  Skin: Negative for color change, rash and wound.  Allergic/Immunologic: Positive for immunocompromised state.  Neurological: Negative for  weakness and numbness.  Hematological: Does not bruise/bleed easily.  Psychiatric/Behavioral: Negative for confusion.  All other systems reviewed and are negative.    Physical Exam Updated Vital Signs BP (!) 167/99 (BP Location: Left Arm)   Pulse 85   Temp 98.3 F (36.8 C) (Oral)   Resp 20   Ht 5\' 7"  (1.702 m)   LMP 09/19/2017   SpO2 99%   BMI 34.46 kg/m   Physical Exam  Constitutional: She is oriented to person, place, and time. She appears well-developed and well-nourished. No distress.  HENT:  Head: Normocephalic and atraumatic.  Cardiovascular: Intact distal pulses.  Pulmonary/Chest: Effort normal.  Musculoskeletal: She exhibits tenderness. She exhibits no deformity.       Right knee: She exhibits decreased range of motion and bony tenderness. She exhibits no swelling, no effusion, no ecchymosis, no deformity, no laceration and no erythema. No medial joint line, no lateral joint line, no MCL, no LCL and no patellar tendon tenderness noted.       Legs: Neurological: She is alert and oriented to person, place, and time. No sensory deficit.  Skin: Skin is warm and dry. No rash noted. She is not diaphoretic. No erythema.  Psychiatric: She has a normal mood and affect. Her behavior is normal.  Nursing note and vitals reviewed.    ED Treatments / Results  Labs (all labs ordered are listed, but only abnormal results are displayed) Labs Reviewed - No data to display  EKG None  Radiology Dg Knee Complete 4 Views Right  Result Date: 10/09/2017 CLINICAL DATA:  Right knee pain EXAM: RIGHT KNEE - COMPLETE 4+ VIEW COMPARISON:  06/23/2013 FINDINGS: No fracture or dislocation is seen. The joint spaces are preserved. The visualized soft tissues are unremarkable. No suprapatellar knee joint effusion. Chronic infrapatellar enthesopathy/calcifications. IMPRESSION: No acute osseus abnormality is seen. Electronically Signed   By: Julian Hy M.D.   On: 10/09/2017 11:24     Procedures Procedures (including critical care time)  Medications Ordered in ED Medications - No data to display   Initial Impression / Assessment and Plan / ED Course  I have reviewed the triage vital signs and the nursing notes.  Pertinent labs & imaging results that were available during my care of the patient were reviewed by me and considered in my medical decision making (see chart for details).  Clinical Course as of Oct 10 1127  Sun Oct 09, 2272  2545 37 year old female with right knee pain.  Patient states that she bumped her knee on a bed yesterday while at work, was  able to continue to ambulate normally and bear weight at that time however woke up today and has pain with any movement of her knee.  She has tenderness along the lateral aspect of her patella, unable to assess range of motion as patient refuses active or passive range of motion holds her leg in full extension.  X-ray is negative for fracture.  Suspect contusion based on mechanism and tenderness.  Offered Ace wrap, recommend ice for 20 to the time, Motrin Tylenol.  Patient refuses to follow his Worker's Comp., advised to follow-up with her PCP.   [LM]    Clinical Course User Index [LM] Tacy Learn, PA-C   Final Clinical Impressions(s) / ED Diagnoses   Final diagnoses:  Contusion of right knee, initial encounter    ED Discharge Orders    None       Roque Lias 10/09/17 1129    Davonna Belling, MD 10/09/17 [phone]

## 2017-10-09 NOTE — Discharge Instructions (Addendum)
Take Motrin and Tylenol as needed as directed for pain.  Apply ice for 20 minutes at a time. Recheck with your family doctor if pain continues.

## 2017-10-09 NOTE — ED Triage Notes (Signed)
Pt. 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

## 2018-08-11 ENCOUNTER — Encounter (HOSPITAL_COMMUNITY): Payer: Self-pay

## 2018-08-11 ENCOUNTER — Other Ambulatory Visit: Payer: Self-pay

## 2018-08-11 ENCOUNTER — Emergency Department (HOSPITAL_COMMUNITY)
Admission: EM | Admit: 2018-08-11 | Discharge: 2018-08-11 | Disposition: A | Discharge status: Home / Self Care | Payer: Self-pay | Attending: Emergency Medicine | Admitting: Emergency Medicine

## 2018-08-11 ENCOUNTER — Emergency Department (HOSPITAL_COMMUNITY): Payer: Self-pay

## 2018-08-11 DIAGNOSIS — Z20822 Contact with and (suspected) exposure to covid-19: Secondary | ICD-10-CM

## 2018-08-11 DIAGNOSIS — E1165 Type 2 diabetes mellitus with hyperglycemia: Secondary | ICD-10-CM | POA: Insufficient documentation

## 2018-08-11 DIAGNOSIS — J45909 Unspecified asthma, uncomplicated: Secondary | ICD-10-CM | POA: Insufficient documentation

## 2018-08-11 DIAGNOSIS — Z79899 Other long term (current) drug therapy: Secondary | ICD-10-CM | POA: Insufficient documentation

## 2018-08-11 DIAGNOSIS — R7989 Other specified abnormal findings of blood chemistry: Secondary | ICD-10-CM

## 2018-08-11 DIAGNOSIS — J189 Pneumonia, unspecified organism: Secondary | ICD-10-CM | POA: Insufficient documentation

## 2018-08-11 DIAGNOSIS — D649 Anemia, unspecified: Secondary | ICD-10-CM | POA: Insufficient documentation

## 2018-08-11 DIAGNOSIS — R944 Abnormal results of kidney function studies: Secondary | ICD-10-CM | POA: Insufficient documentation

## 2018-08-11 DIAGNOSIS — R1031 Right lower quadrant pain: Secondary | ICD-10-CM | POA: Insufficient documentation

## 2018-08-11 DIAGNOSIS — R739 Hyperglycemia, unspecified: Secondary | ICD-10-CM

## 2018-08-11 DIAGNOSIS — Z20828 Contact with and (suspected) exposure to other viral communicable diseases: Secondary | ICD-10-CM | POA: Insufficient documentation

## 2018-08-11 LAB — COMPREHENSIVE METABOLIC PANEL
ALT: 10 U/L (ref 0–44)
AST: 20 U/L (ref 15–41)
Albumin: 2.9 g/dL — ABNORMAL LOW (ref 3.5–5.0)
Alkaline Phosphatase: 75 U/L (ref 38–126)
Anion gap: 9 (ref 5–15)
BUN: 20 mg/dL (ref 6–20)
CO2: 18 mmol/L — ABNORMAL LOW (ref 22–32)
Calcium: 8.6 mg/dL — ABNORMAL LOW (ref 8.9–10.3)
Chloride: 109 mmol/L (ref 98–111)
Creatinine, Ser: 2.66 mg/dL — ABNORMAL HIGH (ref 0.44–1.00)
GFR calc Af Amer: 25 mL/min — ABNORMAL LOW (ref >60)
GFR calc non Af Amer: 22 mL/min — ABNORMAL LOW (ref >60)
Glucose, Bld: 253 mg/dL — ABNORMAL HIGH (ref 70–99)
Potassium: 4.8 mmol/L (ref 3.5–5.1)
Sodium: 136 mmol/L (ref 135–145)
Total Bilirubin: <0.1 mg/dL — ABNORMAL LOW (ref 0.3–1.2)
Total Protein: 7.3 g/dL (ref 6.5–8.1)

## 2018-08-11 LAB — CBC WITH DIFFERENTIAL/PLATELET
Abs Immature Granulocytes: 0 10*3/uL (ref 0.00–0.07)
Basophils Absolute: 0 10*3/uL (ref 0.0–0.1)
Basophils Relative: 0 %
Eosinophils Absolute: 0 10*3/uL (ref 0.0–0.5)
Eosinophils Relative: 0 %
HCT: 30.9 % — ABNORMAL LOW (ref 36.0–46.0)
Hemoglobin: 9.6 g/dL — ABNORMAL LOW (ref 12.0–15.0)
Immature Granulocytes: 0 %
Lymphocytes Relative: 32 %
Lymphs Abs: 1.5 10*3/uL (ref 0.7–4.0)
MCH: 24.4 pg — ABNORMAL LOW (ref 26.0–34.0)
MCHC: 31.1 g/dL (ref 30.0–36.0)
MCV: 78.6 fL — ABNORMAL LOW (ref 80.0–100.0)
Monocytes Absolute: 0.3 10*3/uL (ref 0.1–1.0)
Monocytes Relative: 7 %
Neutro Abs: 2.9 10*3/uL (ref 1.7–7.7)
Neutrophils Relative %: 61 %
Platelets: 283 10*3/uL (ref 150–400)
RBC: 3.93 MIL/uL (ref 3.87–5.11)
RDW: 15 % (ref 11.5–15.5)
WBC: 4.7 10*3/uL (ref 4.0–10.5)
nRBC: 0 % (ref 0.0–0.2)

## 2018-08-11 LAB — I-STAT BETA HCG BLOOD, ED (MC, WL, AP ONLY): I-stat hCG, quantitative: <5 m[IU]/mL (ref <5)

## 2018-08-11 LAB — CBG MONITORING, ED: Glucose-Capillary: 194 mg/dL — ABNORMAL HIGH (ref 70–99)

## 2018-08-11 LAB — LIPASE, BLOOD: Lipase: 36 U/L (ref 11–51)

## 2018-08-11 MED ORDER — SODIUM CHLORIDE 0.9 % IV BOLUS
1000.0000 mL | Freq: Once | INTRAVENOUS | Status: AC
  Administered 2018-08-11: 1000 mL via INTRAVENOUS

## 2018-08-11 MED ORDER — MORPHINE SULFATE (PF) 4 MG/ML IV SOLN
4.0000 mg | Freq: Once | INTRAVENOUS | Status: AC
  Administered 2018-08-11: 17:00:00 4 mg via INTRAVENOUS
  Filled 2018-08-11: qty 1

## 2018-08-11 MED ORDER — METFORMIN HCL 500 MG PO TABS: 500.0000 mg | ORAL_TABLET | Freq: Two times a day (BID) | ORAL | 0 refills | Status: DC

## 2018-08-11 MED ORDER — ONDANSETRON HCL 4 MG/2ML IJ SOLN
4.0000 mg | Freq: Once | INTRAMUSCULAR | Status: AC
  Administered 2018-08-11: 17:00:00 4 mg via INTRAVENOUS
  Filled 2018-08-11: qty 2

## 2018-08-11 MED ORDER — DEXAMETHASONE 6 MG PO TABS: 6.0000 mg | ORAL_TABLET | Freq: Every day | ORAL | 0 refills | Status: AC

## 2018-08-11 NOTE — ED Notes (Signed)
Ambulated pt in room pts O2 sats were maintained at 98-100 but heart rate was elevated to 127-130

## 2018-08-11 NOTE — ED Provider Notes (Signed)
[facility]ERGENC[facility] DEPARTMENT Provider Note   CSN: [id number] Arrival date & time: 08/11/18  1333    History   Chief Complaint Chief Complaint  Patient presents with   Generalized Body Aches    HPI [name] is a 38 y.o. female.     [name] is a 38 y.o. female with history of hypertension, diabetes, asthma, anxiety and depression, who presents to the emergency department for evaluation of 4 to 5 days of low-grade fevers, chills, body aches, cough and shortness of breath.  Patient reports that symptoms have been constant and worsening over the past several days.  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  Reports some nausea, no vomiting, no diarrhea.  Patient reports that over the past 2 days she started having focal right lower quadrant abdominal pain, no flank pain.  She denies any urinary symptoms or hematuria.  She does report loss of taste and smell and that she has had some intermittent rhinorrhea, denies sore throat.  She had an outpatient COVID test completed at [facility] today but has not yet received results.     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 pylori infection 11/24/2010   Depression 11/20/2010   Asthma 11/20/2010   Chronic hypertension in pregnancy 11/08/2010   Type 2 diabetes mellitus ([place]) 11/07/2010    Past Surgical History:  Procedure Laterality Date   NO PAST SURGERIES     TUBAL LIGATION  06/20/2011   Procedure:  POST PARTUM TUBAL LIGATION;  Surgeon: Mora Bellman, MD;  Location: [facility];  Service: Gynecology;  Laterality: Bilateral;  Bilateral post partum tubal ligation     OB History    Gravida  4   Para  3   Term  2   Preterm  1   AB  1   Living  3     SAB  0   TAB  0   Ectopic  1   Multiple  0   Live Births  3            Home Medications    Prior to Admission medications   Medication Sig Start Date End Date Taking? Authorizing Provider  acetaminophen (TYLENOL) 325 MG tablet Take 2 tablets (650 mg total) by mouth every 6 (six) hours as needed. 10/21/16   Jacqlyn Larsen, PA-C  amLODipine (NORVASC) 5 MG tablet Take 1 tablet (5 mg total) by mouth daily. 02/18/16   Velvet Bathe, MD  benzonatate (TESSALON PERLES) 100 MG capsule Take 1 capsule (100 mg total) by mouth 3 (three) times daily as needed for cough. Patient not taking: Reported on 02/14/2016 02/06/16   Mackuen, Courteney Lyn, MD  dexamethasone (DECADRON) 6 MG tablet Take 1 tablet (6 mg total) by mouth daily for 10 days. 08/11/18 08/21/18  Jacqlyn Larsen, PA-C  ferrous sulfate 325 (65 FE) MG tablet Take 1 tablet (325 mg total) by mouth  3 (three) times daily with meals. 02/18/16   Velvet Bathe, MD  metFORMIN (GLUCOPHAGE) 500 MG tablet Take 1 tablet (500 mg total) by mouth 2 (two) times daily with a meal. 08/11/18   Jacqlyn Larsen, PA-C  methocarbamol (ROBAXIN) 500 MG tablet Take 1 tablet (500 mg total) by mouth 2 (two) times daily. 10/21/16   Jacqlyn Larsen, PA-C  ondansetron (ZOFRAN ODT) 4 MG disintegrating tablet Take 1 tablet (4 mg total) by mouth every 8 (eight) hours as needed for nausea. 02/06/17   Tanna Furry, MD  promethazine-dextromethorphan (PROMETHAZINE-DM) 6.25-15 MG/5ML syrup Take 5 mLs by mouth 4 (four) times daily as needed for cough. 02/06/17   Tanna Furry, MD  traMADol (ULTRAM) 50 MG tablet Take 1 tablet (50 mg total) by mouth every 6 (six) hours as needed. 02/06/17   Tanna Furry, MD    Family History Family  History  Problem Relation Age of Onset   Heart disease Mother        CHF   Diabetes Sister    Anesthesia problems Neg Hx     Social History Social History   Tobacco Use   Smoking status: Never Smoker   Smokeless tobacco: Never Used  Substance Use Topics   Alcohol use: No    Comment: occassionally   Drug use: No    Types: Cocaine, Marijuana     Allergies   Shellfish allergy and Morphine and related   Review of Systems Review of Systems  Constitutional: Positive for chills and fatigue. Negative for fever.  HENT: Positive for rhinorrhea. Negative for congestion and sore throat.   Respiratory: Positive for cough and shortness of breath.   Cardiovascular: Negative for chest pain and leg swelling.  Gastrointestinal: Positive for abdominal pain and nausea. Negative for blood in stool, constipation, diarrhea and vomiting.  Genitourinary: Negative for dysuria and frequency.  Musculoskeletal: Positive for back pain and myalgias. Negative for arthralgias.  Skin: Negative for color change and rash.  Neurological: Negative for dizziness, syncope and light-headedness.     Physical Exam Updated Vital Signs BP (!) 142/94    Pulse 100    Temp 99.1 F (37.3 C) (Oral)    Resp 16    LMP 08/11/2018 (Exact Date)    SpO2 100%   Physical Exam Vitals signs and nursing note reviewed.  Constitutional:      General: She is not in acute distress.    Appearance: Normal appearance. She is well-developed. She is obese. She is not ill-appearing or diaphoretic.  HENT:     Head: Normocephalic and atraumatic.     Nose: Congestion present.     Comments: Bilateral nares patent with moderate mucosal edema and clear rhinorrhea present.     Mouth/Throat:     Mouth: Mucous membranes are moist.     Pharynx: Oropharynx is clear.  Eyes:     General:        Right eye: No discharge.        Left eye: No discharge.     Pupils: Pupils are equal, round, and reactive to light.  Neck:      Musculoskeletal: Neck supple.  Cardiovascular:     Rate and Rhythm: Normal rate and regular rhythm.     Pulses: Normal pulses.     Heart sounds: Normal heart sounds. No murmur. No friction rub. No gallop.   Pulmonary:     Effort: Pulmonary effort is normal. No respiratory distress.     Breath sounds: Normal breath sounds. No  wheezing or rales.     Comments: Respirations equal and unlabored, patient able to speak in full sentences, lungs clear to auscultation bilaterally, lung sounds slightly diminished throughout. Abdominal:     General: Bowel sounds are normal. There is no distension.     Palpations: Abdomen is soft. There is no mass.     Tenderness: There is abdominal tenderness. There is no guarding.     Comments: Abdomen is soft and nondistended, bowel sounds present throughout, there is focal tenderness in the right lower quadrant without guarding or peritoneal signs, no CVA tenderness bilaterally.  Musculoskeletal:        General: No deformity.     Right lower leg: No edema.     Left lower leg: No edema.     Comments: Bilateral lower extremities without edema.  Skin:    General: Skin is warm and dry.     Capillary Refill: Capillary refill takes less than 2 seconds.  Neurological:     Mental Status: She is alert.     Coordination: Coordination normal.     Comments: Speech is clear, able to follow commands Moves extremities without ataxia, coordination intact  Psychiatric:        Mood and Affect: Mood normal.        Behavior: Behavior normal.      ED Treatments / Results  Labs (all labs ordered are listed, but only abnormal results are displayed) Labs Reviewed  COMPREHENSIVE METABOLIC PANEL - Abnormal; Notable for the following components:      Result Value   CO2 18 (*)    Glucose, Bld 253 (*)    Creatinine, Ser 2.66 (*)    Calcium 8.6 (*)    Albumin 2.9 (*)    Total Bilirubin <0.1 (*)    GFR calc non Af Amer 22 (*)    GFR calc Af Amer 25 (*)    All other  components within normal limits  CBC WITH DIFFERENTIAL/PLATELET - Abnormal; Notable for the following components:   Hemoglobin 9.6 (*)    HCT 30.9 (*)    MCV 78.6 (*)    MCH 24.4 (*)    All other components within normal limits  CBG MONITORING, ED - Abnormal; Notable for the following components:   Glucose-Capillary 194 (*)    All other components within normal limits  LIPASE, BLOOD  I-STAT BETA HCG BLOOD, ED ([facility], [facility], [facility] ONLY)    EKG None  Radiology Ct Abdomen Pelvis Wo Contrast  Result Date: 08/11/2018 CLINICAL DATA:  Ct a/p wo, Pt endorses body aches,chills for several days. Pt was tested earlier today but will not know results for several days. 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 Hypertensive and hx of same., EXAM: CT ABDOMEN AND PELVIS WITHOUT CONTRAST TECHNIQUE: Multidetector CT imaging of the abdomen and pelvis was performed following the standard protocol without IV contrast. COMPARISON:  None. FINDINGS: Lower chest: Images of the LOWER lungs show patchy focal areas of ground-glass opacity involving the visualized UPPER and LOWER lobes. There is no discrete consolidation. No pleural effusions. Heart size is normal. Hepatobiliary: No focal liver abnormality is seen. No radiopaque gallstones, biliary dilatation, or pericholecystic inflammatory changes. Pancreas: Unremarkable. No pancreatic ductal dilatation or surrounding inflammatory changes. Spleen: Normal in size without focal abnormality. Adrenals/Urinary Tract: Adrenal glands are unremarkable. Kidneys are normal, without renal calculi, focal lesion, or hydronephrosis. Bladder is unremarkable. Stomach/Bowel: The stomach and small bowel loops are normal in appearance. Loops of  colon are unremarkable. The appendix is well seen and has a normal appearance. Vascular/Lymphatic: No significant vascular findings are present. No enlarged abdominal or pelvic lymph nodes. Reproductive: The uterus is present. No adnexal  mass. Other: No abdominal wall hernia or abnormality. No abdominopelvic ascites. Musculoskeletal: No acute or significant osseous findings. IMPRESSION: 1. Lung bases are abnormal, with multifocal ground-glass opacities, consistent with inflammatory or infectious process. Consider Covid-19. 2. Normal appendix. 3. No bowel obstruction. The salient findings were discussed with Aiyanah Kalama on 08/11/2018 at 6:04 pm. We discussed the possibility of Covid-19. Electronically Signed   By: Nolon Nations M.D.   On: 08/11/2018 18:05   Dg Chest Port 1 View  Result Date: 08/11/2018 CLINICAL DATA:  Fever. EXAM: PORTABLE CHEST 1 VIEW COMPARISON:  Radiographs of February 06, 2017. FINDINGS: The heart size and mediastinal contours are within normal limits. Both lungs are clear. The visualized skeletal structures are unremarkable. IMPRESSION: No active disease. Electronically Signed   By: Marijo Conception M.D.   On: 08/11/2018 16:25    Procedures Procedures (including critical care time)  Medications Ordered in ED Medications  sodium chloride 0.9 % bolus 1,000 mL (0 mLs Intravenous Stopped 08/11/18 1927)  ondansetron (ZOFRAN) injection 4 mg (4 mg Intravenous Given 08/11/18 1637)  morphine 4 MG/ML injection 4 mg (4 mg Intravenous Given 08/11/18 1639)     Initial Impression / Assessment and Plan / ED Course  I have reviewed the triage vital signs and the nursing notes.  Pertinent labs & imaging results that were available during my care of the patient were reviewed by me and considered in my medical decision making (see chart for details).  38 year old female presents with fevers, chills, body aches, shortness of breath, cough and abdominal pain.  Symptoms present for about 4 days with abdominal pain worsening over the past 2.  Symptoms started after extended exposure to a COVID positive patient.  [facility] has not yet received results.  On arrival patient with low-grade fever and  normal vitals.  Lungs clear, she does have some focal right lower quadrant tenderness.    Labs obtained from triage which show no leukocytosis or leukopenia.  Hemoglobin of 9.6 which is stable and actually improved slightly from previous.  Glucose of 253 without evidence of DKA or other electrolyte derangements.  CO2 of 18 but I suspect some of this may be due to dehydration.  Creatinine usually around 2.0, currently 2.66, normal LFTs and lipase.  Negative pregnancy.  Suspect the majority of patient's symptoms are due to likely COVID infection, will check chest x-ray but given focal abdominal pain will also check CT abdomen pelvis without contrast due to creatinine.  IV fluids pain and nausea medication given.  Chest x-ray is clear without evidence of infiltrates or other active cardiopulmonary disease.  CT returned without evidence of acute intra-abdominal pathology, normal appendix, no evidence of obstruction, renal stones or any other acute abdominal issues but does show multifocal groundglass opacities in bilateral lung bases concerning for infection, likely COVID-19.  I discussed these results with patient and I do very strongly think this is COVID-19 given patient's and prolonged contact with known positive patient.  Patient ambulated in the room and did not have any hypoxia maintained O2 saturation greater than 90%, did have some tachycardia.  I discussed case with Dr. Waldron Labs with [facility] hospitalist, for patients with groundglass opacities but no hypoxia he does not recommend admission at this time but  close monitoring at home he did recommend starting the patient on Decadron 6 mg daily for 10 days.  Given patient's hyperglycemia for which she is not currently on any medications for what was previously on insulin he recommends Glucophage 500 mg twice daily.  Discussed this with patient and she is in agreement.  She does have a meter and strips to monitor her blood sugar at home.  I have asked that  she follow-up closely with her primary doctor and keep a close eye on her sugar if it continues to elevate out-of-control she should return to the emergency department she also has a pulse ox at home that she can use to monitor her oxygen.  She will return for any worsening.  She expresses understanding and agreement with this plan.  Discharged home in good condition.  Final Clinical Impressions(s) / ED Diagnoses   Final diagnoses:  Suspected Covid-19 Virus Infection  Multifocal pneumonia  Elevated serum creatinine  Hyperglycemia  Chronic anemia    ED Discharge Orders         Ordered    dexamethasone (DECADRON) 6 MG tablet  Daily     08/11/18 1928    metFORMIN (GLUCOPHAGE) 500 MG tablet  2 times daily with meals     08/11/18 1928           Jacqlyn Larsen, [place] 08/13/18 0041    Hayden Rasmussen, MD 08/13/18 1034

## 2018-08-11 NOTE — Discharge Instructions (Addendum)
Your CT scan shows evidence of COVID Pneumonia. Please continue to quarantine at home and monitor your symptoms closely. Antibiotics are not helpful in treating viral infection, the virus should run its course in about 10-14 days. Please make sure you are drinking plenty of fluids. You can treat your symptoms supportively with tylenol for fevers and pains, and over the counter cough syrups and throat lozenges to help with cough. If your symptoms are not improving please follow up with you Primary doctor.  We recommend that you take steroids, Decadron 6 mg daily for the next 10 days, because your blood sugar was already elevated here today we recommend that you take metformin twice daily to help control your blood sugar, check your blood sugar daily if blood sugar persistently high, greater than 400 please return to the emergency department.  I recommend that you purchase a home pulse ox to help better monitor your oxygen at home, if you start to have increased work of breathing or shortness of breath or your oxygen drops below 91%-92% please immediately return to the hospital for reevaluation.  If you develop persistent fevers, shortness of breath or difficulty breathing, chest pain, severe headache and neck pain, persistent nausea and vomiting or other new or concerning symptoms return to the Emergency department.

## 2018-08-11 NOTE — ED Triage Notes (Signed)
Pt endorses bodyaches,chills for several days. Pt was tested earlier today but will not know results for several days. 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 Hypertensive and hx of same.,

## 2018-08-23 ENCOUNTER — Encounter (HOSPITAL_COMMUNITY): Payer: Self-pay | Admitting: Emergency Medicine

## 2018-08-23 ENCOUNTER — Other Ambulatory Visit: Payer: Self-pay

## 2018-08-23 ENCOUNTER — Inpatient Hospital Stay (HOSPITAL_COMMUNITY)
Admission: EM | Admit: 2018-08-23 | Discharge: 2018-08-25 | DRG: 682 | Disposition: A | Discharge status: Home / Self Care | Payer: Self-pay | Attending: Internal Medicine | Admitting: Internal Medicine

## 2018-08-23 DIAGNOSIS — R739 Hyperglycemia, unspecified: Secondary | ICD-10-CM

## 2018-08-23 DIAGNOSIS — N179 Acute kidney failure, unspecified: Principal | ICD-10-CM | POA: Diagnosis present

## 2018-08-23 DIAGNOSIS — F419 Anxiety disorder, unspecified: Secondary | ICD-10-CM | POA: Diagnosis present

## 2018-08-23 DIAGNOSIS — D473 Essential (hemorrhagic) thrombocythemia: Secondary | ICD-10-CM

## 2018-08-23 DIAGNOSIS — Z8249 Family history of ischemic heart disease and other diseases of the circulatory system: Secondary | ICD-10-CM

## 2018-08-23 DIAGNOSIS — E1122 Type 2 diabetes mellitus with diabetic chronic kidney disease: Secondary | ICD-10-CM | POA: Diagnosis present

## 2018-08-23 DIAGNOSIS — U071 COVID-19: Secondary | ICD-10-CM | POA: Diagnosis present

## 2018-08-23 DIAGNOSIS — N184 Chronic kidney disease, stage 4 (severe): Secondary | ICD-10-CM | POA: Diagnosis present

## 2018-08-23 DIAGNOSIS — I129 Hypertensive chronic kidney disease with stage 1 through stage 4 chronic kidney disease, or unspecified chronic kidney disease: Secondary | ICD-10-CM | POA: Diagnosis present

## 2018-08-23 DIAGNOSIS — F329 Major depressive disorder, single episode, unspecified: Secondary | ICD-10-CM | POA: Diagnosis present

## 2018-08-23 DIAGNOSIS — R748 Abnormal levels of other serum enzymes: Secondary | ICD-10-CM

## 2018-08-23 DIAGNOSIS — E1110 Type 2 diabetes mellitus with ketoacidosis without coma: Secondary | ICD-10-CM | POA: Diagnosis present

## 2018-08-23 DIAGNOSIS — R112 Nausea with vomiting, unspecified: Secondary | ICD-10-CM

## 2018-08-23 DIAGNOSIS — E1165 Type 2 diabetes mellitus with hyperglycemia: Secondary | ICD-10-CM | POA: Diagnosis present

## 2018-08-23 DIAGNOSIS — E872 Acidosis: Secondary | ICD-10-CM | POA: Diagnosis present

## 2018-08-23 DIAGNOSIS — K5900 Constipation, unspecified: Secondary | ICD-10-CM | POA: Diagnosis present

## 2018-08-23 DIAGNOSIS — Z79891 Long term (current) use of opiate analgesic: Secondary | ICD-10-CM

## 2018-08-23 DIAGNOSIS — R1031 Right lower quadrant pain: Secondary | ICD-10-CM

## 2018-08-23 DIAGNOSIS — Z20822 Contact with and (suspected) exposure to covid-19: Secondary | ICD-10-CM

## 2018-08-23 DIAGNOSIS — R109 Unspecified abdominal pain: Secondary | ICD-10-CM

## 2018-08-23 DIAGNOSIS — Z9114 Patient's other noncompliance with medication regimen: Secondary | ICD-10-CM

## 2018-08-23 DIAGNOSIS — D509 Iron deficiency anemia, unspecified: Secondary | ICD-10-CM | POA: Diagnosis present

## 2018-08-23 DIAGNOSIS — D75839 Thrombocytosis, unspecified: Secondary | ICD-10-CM

## 2018-08-23 DIAGNOSIS — J45909 Unspecified asthma, uncomplicated: Secondary | ICD-10-CM | POA: Diagnosis present

## 2018-08-23 NOTE — ED Triage Notes (Signed)
Patient here with body aches.  She states that she was here two weeks ago for the same. Patient states that she has had a test two weeks ago.  She has not heard the result.  She has a funny taste in her mouth.  She just is not feeling well and she has lost weight.

## 2018-08-24 ENCOUNTER — Observation Stay (HOSPITAL_COMMUNITY): Payer: Self-pay

## 2018-08-24 ENCOUNTER — Encounter (HOSPITAL_COMMUNITY): Payer: Self-pay | Admitting: Internal Medicine

## 2018-08-24 ENCOUNTER — Emergency Department (HOSPITAL_COMMUNITY): Payer: Self-pay

## 2018-08-24 DIAGNOSIS — E1110 Type 2 diabetes mellitus with ketoacidosis without coma: Secondary | ICD-10-CM | POA: Diagnosis present

## 2018-08-24 DIAGNOSIS — N179 Acute kidney failure, unspecified: Secondary | ICD-10-CM | POA: Diagnosis present

## 2018-08-24 DIAGNOSIS — R1110 Vomiting, unspecified: Secondary | ICD-10-CM | POA: Insufficient documentation

## 2018-08-24 DIAGNOSIS — R112 Nausea with vomiting, unspecified: Secondary | ICD-10-CM

## 2018-08-24 DIAGNOSIS — E1165 Type 2 diabetes mellitus with hyperglycemia: Secondary | ICD-10-CM | POA: Diagnosis present

## 2018-08-24 LAB — CBC WITH DIFFERENTIAL/PLATELET
Abs Immature Granulocytes: 0.05 10*3/uL (ref 0.00–0.07)
Basophils Absolute: 0 10*3/uL (ref 0.0–0.1)
Basophils Relative: 0 %
Eosinophils Absolute: 0.1 10*3/uL (ref 0.0–0.5)
Eosinophils Relative: 1 %
HCT: 33.2 % — ABNORMAL LOW (ref 36.0–46.0)
Hemoglobin: 10.5 g/dL — ABNORMAL LOW (ref 12.0–15.0)
Immature Granulocytes: 1 %
Lymphocytes Relative: 24 %
Lymphs Abs: 2.2 10*3/uL (ref 0.7–4.0)
MCH: 24.1 pg — ABNORMAL LOW (ref 26.0–34.0)
MCHC: 31.6 g/dL (ref 30.0–36.0)
MCV: 76.3 fL — ABNORMAL LOW (ref 80.0–100.0)
Monocytes Absolute: 0.6 10*3/uL (ref 0.1–1.0)
Monocytes Relative: 7 %
Neutro Abs: 6 10*3/uL (ref 1.7–7.7)
Neutrophils Relative %: 67 %
Platelets: 439 10*3/uL — ABNORMAL HIGH (ref 150–400)
RBC: 4.35 MIL/uL (ref 3.87–5.11)
RDW: 14 % (ref 11.5–15.5)
WBC: 8.9 10*3/uL (ref 4.0–10.5)
nRBC: 0 % (ref 0.0–0.2)

## 2018-08-24 LAB — I-STAT BETA HCG BLOOD, ED (MC, WL, AP ONLY): I-stat hCG, quantitative: <5 m[IU]/mL (ref <5)

## 2018-08-24 LAB — CBG MONITORING, ED
Glucose-Capillary: 521 mg/dL (ref 70–99)
Glucose-Capillary: 438 mg/dL — ABNORMAL HIGH (ref 70–99)
Glucose-Capillary: 323 mg/dL — ABNORMAL HIGH (ref 70–99)
Glucose-Capillary: 510 mg/dL (ref 70–99)
Glucose-Capillary: 114 mg/dL — ABNORMAL HIGH (ref 70–99)

## 2018-08-24 LAB — COMPREHENSIVE METABOLIC PANEL
ALT: 15 U/L (ref 0–44)
AST: 12 U/L — ABNORMAL LOW (ref 15–41)
Albumin: 2.7 g/dL — ABNORMAL LOW (ref 3.5–5.0)
Alkaline Phosphatase: 93 U/L (ref 38–126)
Anion gap: 11 (ref 5–15)
BUN: 45 mg/dL — ABNORMAL HIGH (ref 6–20)
CO2: 19 mmol/L — ABNORMAL LOW (ref 22–32)
Calcium: 8.7 mg/dL — ABNORMAL LOW (ref 8.9–10.3)
Chloride: 95 mmol/L — ABNORMAL LOW (ref 98–111)
Creatinine, Ser: 3.03 mg/dL — ABNORMAL HIGH (ref 0.44–1.00)
GFR calc Af Amer: 21 mL/min — ABNORMAL LOW (ref >60)
GFR calc non Af Amer: 18 mL/min — ABNORMAL LOW (ref >60)
Glucose, Bld: 660 mg/dL (ref 70–99)
Potassium: 5.4 mmol/L — ABNORMAL HIGH (ref 3.5–5.1)
Sodium: 125 mmol/L — ABNORMAL LOW (ref 135–145)
Total Bilirubin: 0.5 mg/dL (ref 0.3–1.2)
Total Protein: 7.6 g/dL (ref 6.5–8.1)

## 2018-08-24 LAB — LIPASE, BLOOD: Lipase: 65 U/L — ABNORMAL HIGH (ref 11–51)

## 2018-08-24 LAB — URINALYSIS, ROUTINE W REFLEX MICROSCOPIC
Bilirubin Urine: NEGATIVE
Glucose, UA: >=500 mg/dL — AB
Ketones, ur: NEGATIVE mg/dL
Leukocytes,Ua: NEGATIVE
Nitrite: NEGATIVE
Protein, ur: >=300 mg/dL — AB
Specific Gravity, Urine: 1.027 (ref 1.005–1.030)
pH: 5 (ref 5.0–8.0)

## 2018-08-24 LAB — HEMOGLOBIN A1C
Hgb A1c MFr Bld: 12.5 % — ABNORMAL HIGH (ref 4.8–5.6)
Mean Plasma Glucose: 312.05 mg/dL

## 2018-08-24 LAB — BASIC METABOLIC PANEL
Anion gap: 10 (ref 5–15)
BUN: 37 mg/dL — ABNORMAL HIGH (ref 6–20)
CO2: 20 mmol/L — ABNORMAL LOW (ref 22–32)
Calcium: 8.1 mg/dL — ABNORMAL LOW (ref 8.9–10.3)
Chloride: 102 mmol/L (ref 98–111)
Creatinine, Ser: 2.29 mg/dL — ABNORMAL HIGH (ref 0.44–1.00)
GFR calc Af Amer: 30 mL/min — ABNORMAL LOW (ref >60)
GFR calc non Af Amer: 26 mL/min — ABNORMAL LOW (ref >60)
Glucose, Bld: 262 mg/dL — ABNORMAL HIGH (ref 70–99)
Potassium: 4.4 mmol/L (ref 3.5–5.1)
Sodium: 132 mmol/L — ABNORMAL LOW (ref 135–145)

## 2018-08-24 LAB — GLUCOSE, CAPILLARY
Glucose-Capillary: 128 mg/dL — ABNORMAL HIGH (ref 70–99)
Glucose-Capillary: 128 mg/dL — ABNORMAL HIGH (ref 70–99)
Glucose-Capillary: 173 mg/dL — ABNORMAL HIGH (ref 70–99)
Glucose-Capillary: 255 mg/dL — ABNORMAL HIGH (ref 70–99)
Glucose-Capillary: 258 mg/dL — ABNORMAL HIGH (ref 70–99)
Glucose-Capillary: 245 mg/dL — ABNORMAL HIGH (ref 70–99)
Glucose-Capillary: 194 mg/dL — ABNORMAL HIGH (ref 70–99)
Glucose-Capillary: 161 mg/dL — ABNORMAL HIGH (ref 70–99)
Glucose-Capillary: 191 mg/dL — ABNORMAL HIGH (ref 70–99)
Glucose-Capillary: 172 mg/dL — ABNORMAL HIGH (ref 70–99)

## 2018-08-24 LAB — MAGNESIUM: Magnesium: 1.9 mg/dL (ref 1.7–2.4)

## 2018-08-24 LAB — SAMPLE TO BLOOD BANK

## 2018-08-24 LAB — FERRITIN: Ferritin: 78 ng/mL (ref 11–307)

## 2018-08-24 LAB — SARS CORONAVIRUS 2 BY RT PCR (HOSPITAL ORDER, PERFORMED IN ~~LOC~~ HOSPITAL LAB): SARS Coronavirus 2: POSITIVE — AB

## 2018-08-24 LAB — D-DIMER, QUANTITATIVE: D-Dimer, Quant: 1.06 ug/mL-FEU — ABNORMAL HIGH (ref 0.00–0.50)

## 2018-08-24 LAB — SARS CORONAVIRUS 2 (TAT 6-24 HRS): SARS Coronavirus 2: POSITIVE — AB

## 2018-08-24 MED ORDER — FENTANYL CITRATE (PF) 100 MCG/2ML IJ SOLN
25.0000 ug | INTRAMUSCULAR | Status: DC | PRN
  Administered 2018-08-24 – 2018-08-25 (×3): 25 ug via INTRAVENOUS
  Filled 2018-08-24 (×3): qty 2

## 2018-08-24 MED ORDER — ACETAMINOPHEN 650 MG RE SUPP: 650.0000 mg | Freq: Four times a day (QID) | RECTAL | Status: DC | PRN

## 2018-08-24 MED ORDER — TRAMADOL HCL 50 MG PO TABS: 50.0000 mg | ORAL_TABLET | Freq: Four times a day (QID) | ORAL | Status: DC | PRN

## 2018-08-24 MED ORDER — DEXTROSE 50 % IV SOLN: 25.0000 mL | INTRAVENOUS | Status: DC | PRN

## 2018-08-24 MED ORDER — LACTATED RINGERS IV BOLUS
1000.0000 mL | Freq: Once | INTRAVENOUS | Status: AC
  Administered 2018-08-24: 1000 mL via INTRAVENOUS

## 2018-08-24 MED ORDER — INSULIN REGULAR(HUMAN) IN NACL 100-0.9 UT/100ML-% IV SOLN: INTRAVENOUS | Status: DC

## 2018-08-24 MED ORDER — DEXTROSE-NACL 5-0.45 % IV SOLN
INTRAVENOUS | Status: DC
  Administered 2018-08-24: 14:00:00 via INTRAVENOUS

## 2018-08-24 MED ORDER — ONDANSETRON HCL 4 MG/2ML IJ SOLN
4.0000 mg | Freq: Once | INTRAMUSCULAR | Status: AC
  Administered 2018-08-24: 04:00:00 4 mg via INTRAVENOUS
  Filled 2018-08-24: qty 2

## 2018-08-24 MED ORDER — INSULIN GLARGINE 100 UNIT/ML ~~LOC~~ SOLN
20.0000 [IU] | Freq: Every day | SUBCUTANEOUS | Status: DC
  Administered 2018-08-24: 20 [IU] via SUBCUTANEOUS
  Filled 2018-08-24 (×2): qty 0.2

## 2018-08-24 MED ORDER — HYDRALAZINE HCL 20 MG/ML IJ SOLN: 5.0000 mg | INTRAMUSCULAR | Status: DC | PRN

## 2018-08-24 MED ORDER — INSULIN ASPART 100 UNIT/ML ~~LOC~~ SOLN: 0.0000 [IU] | Freq: Every day | SUBCUTANEOUS | Status: DC

## 2018-08-24 MED ORDER — SODIUM CHLORIDE 0.9 % IV SOLN: INTRAVENOUS | Status: DC

## 2018-08-24 MED ORDER — INSULIN REGULAR BOLUS VIA INFUSION
0.0000 [IU] | Freq: Three times a day (TID) | INTRAVENOUS | Status: DC
  Administered 2018-08-24: 4 [IU] via INTRAVENOUS
  Administered 2018-08-24: 3 [IU] via INTRAVENOUS
  Filled 2018-08-24: qty 10

## 2018-08-24 MED ORDER — LACTATED RINGERS IV SOLN: INTRAVENOUS | Status: DC

## 2018-08-24 MED ORDER — INSULIN REGULAR BOLUS VIA INFUSION
0.0000 [IU] | Freq: Three times a day (TID) | INTRAVENOUS | Status: DC
  Filled 2018-08-24: qty 10

## 2018-08-24 MED ORDER — ONDANSETRON HCL 4 MG PO TABS: 4.0000 mg | ORAL_TABLET | Freq: Four times a day (QID) | ORAL | Status: DC | PRN

## 2018-08-24 MED ORDER — ACETAMINOPHEN 325 MG PO TABS: 650.0000 mg | ORAL_TABLET | Freq: Four times a day (QID) | ORAL | Status: DC | PRN

## 2018-08-24 MED ORDER — LACTATED RINGERS IV SOLN
INTRAVENOUS | Status: DC
  Administered 2018-08-24: 13:00:00 via INTRAVENOUS

## 2018-08-24 MED ORDER — MORPHINE SULFATE (PF) 4 MG/ML IV SOLN
4.0000 mg | Freq: Once | INTRAVENOUS | Status: AC
  Administered 2018-08-24: 4 mg via INTRAVENOUS
  Filled 2018-08-24: qty 1

## 2018-08-24 MED ORDER — ENOXAPARIN SODIUM 40 MG/0.4ML ~~LOC~~ SOLN: 40.0000 mg | SUBCUTANEOUS | Status: DC

## 2018-08-24 MED ORDER — SODIUM CHLORIDE 0.9 % IV BOLUS
1000.0000 mL | Freq: Once | INTRAVENOUS | Status: AC
  Administered 2018-08-24: 04:00:00 1000 mL via INTRAVENOUS

## 2018-08-24 MED ORDER — ACETAMINOPHEN 325 MG PO TABS
650.0000 mg | ORAL_TABLET | Freq: Once | ORAL | Status: AC
  Administered 2018-08-24: 650 mg via ORAL
  Filled 2018-08-24: qty 2

## 2018-08-24 MED ORDER — ONDANSETRON HCL 4 MG/2ML IJ SOLN
4.0000 mg | Freq: Four times a day (QID) | INTRAMUSCULAR | Status: DC | PRN
  Administered 2018-08-24 (×2): 4 mg via INTRAVENOUS
  Filled 2018-08-24 (×2): qty 2

## 2018-08-24 MED ORDER — ALBUTEROL SULFATE HFA 108 (90 BASE) MCG/ACT IN AERS
2.0000 | INHALATION_SPRAY | Freq: Four times a day (QID) | RESPIRATORY_TRACT | Status: DC | PRN
  Filled 2018-08-24: qty 6.7

## 2018-08-24 MED ORDER — SODIUM CHLORIDE 0.9 % IV SOLN
INTRAVENOUS | Status: DC
  Administered 2018-08-24: 06:00:00 via INTRAVENOUS

## 2018-08-24 MED ORDER — METHOCARBAMOL 500 MG PO TABS
500.0000 mg | ORAL_TABLET | Freq: Two times a day (BID) | ORAL | Status: DC
  Administered 2018-08-24 – 2018-08-25 (×3): 500 mg via ORAL
  Filled 2018-08-24 (×3): qty 1

## 2018-08-24 MED ORDER — DEXTROSE-NACL 5-0.45 % IV SOLN
INTRAVENOUS | Status: DC
  Administered 2018-08-24: 11:00:00 via INTRAVENOUS

## 2018-08-24 MED ORDER — AMLODIPINE BESYLATE 5 MG PO TABS
5.0000 mg | ORAL_TABLET | Freq: Every day | ORAL | Status: DC
  Administered 2018-08-24: 5 mg via ORAL
  Filled 2018-08-24: qty 1

## 2018-08-24 MED ORDER — LACTATED RINGERS IV SOLN
INTRAVENOUS | Status: DC
  Administered 2018-08-24: 22:00:00 via INTRAVENOUS

## 2018-08-24 MED ORDER — INSULIN ASPART 100 UNIT/ML ~~LOC~~ SOLN
0.0000 [IU] | Freq: Three times a day (TID) | SUBCUTANEOUS | Status: DC
  Administered 2018-08-25: 7 [IU] via SUBCUTANEOUS
  Administered 2018-08-25: 08:00:00 5 [IU] via SUBCUTANEOUS

## 2018-08-24 MED ORDER — INSULIN REGULAR(HUMAN) IN NACL 100-0.9 UT/100ML-% IV SOLN
INTRAVENOUS | Status: DC
  Administered 2018-08-24: 4.6 [IU]/h via INTRAVENOUS
  Filled 2018-08-24: qty 100

## 2018-08-24 MED ORDER — INSULIN REGULAR(HUMAN) IN NACL 100-0.9 UT/100ML-% IV SOLN
INTRAVENOUS | Status: DC
  Administered 2018-08-24: 14:00:00 2 [IU]/h via INTRAVENOUS

## 2018-08-24 NOTE — Progress Notes (Signed)
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  She underwent GI work-up in the ED including labs and CT abdomen which were negative for any acute abnormalities except for elevated blood glucose.  She was given a prescription for metformin and discharged home with instructions to [facility] results on call with testing that she had undergone few days prior.  Patient states she did test positive at that time and was given an antibiotic to take.  She does not recollect the name but she has been extremely nauseous, vomiting everything she ate and been unable to tolerate any p.o. medications including metformin.  She presented again last night to the ED given persistent symptoms and inability to tolerate p.o.  Labs showed significant hyperglycemia with blood glucose at 800, mild acidosis (baseline of 20 -->17) and worsening of renal function (creatinine from baseline around 2.2-> 2.6 on 7/24--> now at 3.03). Patient remains on insulin drip/IV fluids/n.p.o.  Abdomen benign on exam.  Points to suprapubic area for pain.  Asking for pain medications.  IV fentanyl ordered.  Urine drug screen requested.  Her repeat COVID testing came back positive and patient will be transferred to [place] as discussed with Dr. Candiss Norse.  Patient needs greater than 2 midnight inpatient stay for continued insulin drip/IV hydration and management of DKA/AKI/Noval coronavirus infection.  Of note, patient underwent abdominal ultrasonogram in the ED, will need follow-up.

## 2018-08-24 NOTE — ED Provider Notes (Signed)
[facility] EMERGENCY DEPARTMENT Provider Note   CSN: [id number] Arrival date & time: 08/23/18  2051    History   Chief Complaint Chief Complaint  Patient presents with  . Generalized Body Aches    HPI [name] is a 38 y.o. female.   The history is provided by the patient.  She has history of diabetes, hypertension, asthma and comes in complaining of ongoing problems with abdominal pain and nausea.  She was seen in the ED 2 weeks ago following exposure to COVID-19.  She had a COVID-19 test done through [facility] and she has not heard the report yet.  She continues to have right lower quadrant pain with some radiation to the right flank.  She rates pain at 8/10.  She complains of nausea but has not vomited.  She had subjective fever earlier today as well as chills and sweats.  She denies rhinorrhea or sore throat or cough.  She has a funny taste in her mouth and has noted decreased appetite.  She denies any change in sense of smell.  She states that she is constipated and has not had a bowel movement for the last 2 weeks.  Past Medical History:  Diagnosis Date  . Anxiety   . Asthma   . Depression   . Diabetes mellitus   . Hyperemesis arising during pregnancy   . Hypertension   . Mental disorder     Patient Active Problem List   Diagnosis Date Noted  . Sepsis ([place]) 02/15/2016  . Generalized abdominal pain 02/15/2016  . CAP (community acquired pneumonia) 02/14/2016  . 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 Back pain 07/28/2011  . H. pylori infection 11/24/2010  . Depression 11/20/2010  . Asthma 11/20/2010  . Chronic hypertension in pregnancy 11/08/2010  . Type 2 diabetes mellitus ([place]) 11/07/2010    Past Surgical History:  Procedure Laterality Date  . NO PAST SURGERIES    . TUBAL LIGATION  06/20/2011   Procedure: POST PARTUM TUBAL LIGATION;  Surgeon: Mora Bellman, MD;  Location: [facility];  Service: Gynecology;  Laterality: Bilateral;  Bilateral post partum  tubal ligation     OB History    Gravida  4   Para  3   Term  2   Preterm  1   AB  1   Living  3     SAB  0   TAB  0   Ectopic  1   Multiple  0   Live Births  3            Home Medications    Prior to Admission medications   Medication Sig Start Date End Date Taking? Authorizing Provider  acetaminophen (TYLENOL) 325 MG tablet Take 2 tablets (650 mg total) by mouth every 6 (six) hours as needed. 10/21/16   Jacqlyn Larsen, PA-C  amLODipine (NORVASC) 5 MG tablet Take 1 tablet (5 mg total) by mouth daily. 02/18/16   Velvet Bathe, MD  benzonatate (TESSALON PERLES) 100 MG capsule Take 1 capsule (100 mg total) by mouth 3 (three) times daily as needed for cough. Patient not taking: Reported on 02/14/2016 02/06/16   Mackuen, Courteney Lyn, MD  ferrous sulfate 325 (65 FE) MG tablet Take 1 tablet (325 mg total) by mouth 3 (three) times daily with meals. 02/18/16   Velvet Bathe, MD  metFORMIN (GLUCOPHAGE) 500 MG tablet Take 1 tablet (500 mg total) by mouth 2 (two) times daily with a meal. 08/11/18   Marijean Bravo,  Audery Amel, PA-C  methocarbamol (ROBAXIN) 500 MG tablet Take 1 tablet (500 mg total) by mouth 2 (two) times daily. 10/21/16   Jacqlyn Larsen, PA-C  ondansetron (ZOFRAN ODT) 4 MG disintegrating tablet Take 1 tablet (4 mg total) by mouth every 8 (eight) hours as needed for nausea. 02/06/17   Tanna Furry, MD  promethazine-dextromethorphan (PROMETHAZINE-DM) 6.25-15 MG/5ML syrup Take 5 mLs by mouth 4 (four) times daily as needed for cough. 02/06/17   Tanna Furry, MD  traMADol (ULTRAM) 50 MG tablet Take 1 tablet (50 mg total) by mouth every 6 (six) hours as needed. 02/06/17   Tanna Furry, MD    Family History Family History  Problem Relation Age of Onset  . Heart disease Mother        CHF  . Diabetes Sister   . Anesthesia problems Neg Hx     Social History Social History   Tobacco Use  . Smoking status: Never Smoker  . Smokeless tobacco: Never Used  Substance Use Topics  .  Alcohol use: No    Comment: occassionally  . Drug use: No    Types: Cocaine, Marijuana     Allergies   Shellfish allergy and Morphine and related   Review of Systems Review of Systems  All other systems reviewed and are negative.    Physical Exam Updated Vital Signs BP (!) 157/126 (BP Location: Right Arm)   Pulse (!) 110   Temp 98.6 F (37 C) (Oral)   Resp 16   LMP 08/11/2018 (Exact Date)   SpO2 100%   Physical Exam Vitals signs and nursing note reviewed.    38 year old female, resting comfortably and in no acute distress. Vital signs are significant for elevated blood pressure and heart rate. Oxygen saturation is 100%, which is normal. Head is normocephalic and atraumatic. PERRLA, EOMI. Oropharynx is clear. Neck is nontender and supple without adenopathy or JVD. Back is nontender and there is no CVA tenderness. Lungs are clear without rales, wheezes, or rhonchi. Chest is nontender. Heart has regular rate and rhythm without murmur. Abdomen is soft, flat, with mild right lower quadrant tenderness.  There is no rebound or guarding.  There are no masses or hepatosplenomegaly and peristalsis is slightly hypoactive. Extremities have no cyanosis or edema, full range of motion is present. Skin is warm and dry without rash. Neurologic: Mental status is normal, cranial nerves are intact, there are no motor or sensory deficits.  ED Treatments / Results  Labs (all labs ordered are listed, but only abnormal results are displayed) Labs Reviewed  COMPREHENSIVE METABOLIC PANEL - Abnormal; Notable for the following components:      Result Value   Sodium 125 (*)    Potassium 5.4 (*)    Chloride 95 (*)    CO2 19 (*)    Glucose, Bld 660 (*)    BUN 45 (*)    Creatinine, Ser 3.03 (*)    Calcium 8.7 (*)    Albumin 2.7 (*)    AST 12 (*)    GFR calc non Af Amer 18 (*)    GFR calc Af Amer 21 (*)    All other components within normal limits  CBC WITH DIFFERENTIAL/PLATELET -  Abnormal; Notable for the following components:   Hemoglobin 10.5 (*)    HCT 33.2 (*)    MCV 76.3 (*)    MCH 24.1 (*)    Platelets 439 (*)    All other components within normal limits  LIPASE, BLOOD -  Abnormal; Notable for the following components:   Lipase 65 (*)    All other components within normal limits  URINALYSIS, ROUTINE W REFLEX MICROSCOPIC - Abnormal; Notable for the following components:   APPearance HAZY (*)    Glucose, UA >=500 (*)    Hgb urine dipstick SMALL (*)    Protein, ur >=300 (*)    Bacteria, UA RARE (*)    All other components within normal limits  CBG MONITORING, ED - Abnormal; Notable for the following components:   Glucose-Capillary 521 (*)    All other components within normal limits  SARS CORONAVIRUS 2  SARS CORONAVIRUS 2  I-STAT BETA HCG BLOOD, ED ([facility], [facility], [facility] ONLY)   Radiology Dg Chest Port 1 View  Result Date: 08/24/2018 CLINICAL DATA:  Fever EXAM: PORTABLE CHEST 1 VIEW COMPARISON:  08/11/2018 FINDINGS: Heart and mediastinal contours are within normal limits. No focal opacities or effusions. No acute bony abnormality. IMPRESSION: No active disease. Electronically Signed   By: Rolm Baptise M.D.   On: 08/24/2018 03:13    Procedures Procedures  CRITICAL CARE Performed by: Delora Fuel Total critical care time: 45 minutes Critical care time was exclusive of separately billable procedures and treating other patients. Critical care was necessary to treat or prevent imminent or life-threatening deterioration. Critical care was time spent personally by me on the following activities: development of treatment plan with patient and/or surrogate as well as nursing, discussions with consultants, evaluation of patient's response to treatment, examination of patient, obtaining history from patient or surrogate, ordering and performing treatments and interventions, ordering and review of laboratory studies, ordering and review of radiographic studies, pulse oximetry  and re-evaluation of patient's condition.  Medications Ordered in ED Medications - No data to display   Initial Impression / Assessment and Plan / ED Course  I have reviewed the triage vital signs and the nursing notes.  Pertinent labs & imaging results that were available during my care of the patient were reviewed by me and considered in my medical decision making (see chart for details).  Persistent nausea and right lower quadrant pain.  Old records are reviewed, and she had a thorough evaluation at an ED visit on July 24 including CT of abdomen which showed no intra-abdominal pathology but did show multifocal groundglass opacities consistent with COVID-19.  At this point, abdominal exam is rather benign and I do not feel that CT needs to be repeated.  She will be given acetaminophen and IV fluids and IV ondansetron.  Chest x-ray shows no evidence of infiltrates.  Labs are significant for marked hyperglycemia without evidence of ketoacidosis.  Also, worsening renal function.  Patient does admit that she has been using over-the-counter naproxen for her pain, and that may account for some of her worsening renal function.  Because of degree of hyperglycemia and ongoing worsening renal function, it is felt that she should be admitted to optimize her diabetic regimen and to optimize her renal function.  Incidental finding of mildly elevated lipase is probably not clinically significant.  Mild anemia is present and actually improved over baseline.  Thrombocytosis is noted and is felt to be reactive.  Case is discussed with Dr. Hal Hope of [facility] hospital[facility], who agrees to admit the patient.  [name] was evaluated in Emergency Department on 08/24/2018 for the symptoms described in the history of present illness. She was evaluated in the context of the global COVID-19 pandemic, which necessitated consideration that the patient might be at risk for infection  with the SARS-CoV-2 virus that causes  COVID-19. Institutional protocols and algorithms that pertain to the evaluation of patients at risk for COVID-19 are in a state of rapid change based on information released by regulatory bodies including the CDC and federal and state organizations. These policies and algorithms were followed during the patient's care in the ED.   Final Clinical Impressions(s) / ED Diagnoses   Final diagnoses:  Acute kidney injury (nontraumatic) (HCC)  Hyperglycemia  Elevated lipase  RLQ abdominal pain  Suspected Covid-19 Virus Infection  Microcytic anemia  Thrombocytosis (HCC)  Non-intractable vomiting with nausea, unspecified vomiting type    ED Discharge Orders    None       Delora Fuel, MD 96/94/09 0530

## 2018-08-24 NOTE — ED Notes (Addendum)
ED TO INPATIENT HANDOFF REPORT  ED Nurse Name and Phone #:  [phone]  S Name/Age/Gender [name] 38 y.o. female Room/Bed: 035C/035C  Code Status   Code Status: Prior  Home/SNF/Other Home Patient oriented to: self, place, time and situation Is this baseline? Yes   Triage Complete: Triage complete  Chief Complaint body pain, fever  Triage Note Patient here with body aches.  She states that she was here two weeks ago for the same. Patient states that she has had a test two weeks ago.  She has not heard the result.  She has a funny taste in her mouth.  She just is not feeling well and she has lost weight.   Allergies Allergies  Allergen Reactions  . Shellfish Allergy Anaphylaxis and Swelling  . Morphine And Related Nausea And Vomiting    Level of Care/Admitting Diagnosis ED Disposition    ED Disposition Condition [facility]: [place] [[id number]]  Level of Care: Progressive [102]  Covid Evaluation: Confirmed COVID Positive  Diagnosis: DKA (diabetic ketoacidoses) [facility]) [[id number]]  Admitting Physician: Guilford Shi [[id number]]  Attending Physician: Guilford Shi [[id number]]  Estimated length of stay: past midnight tomorrow  Certification:: I certify this patient will need inpatient services for at least 2 midnights  PT Class (Do Not Modify): Inpatient [101]  PT Acc Code (Do Not Modify): Private [1]       B Medical/Surgery History Past Medical History:  Diagnosis Date  . Anxiety   . Asthma   . Depression   . Diabetes mellitus   . Hyperemesis arising during pregnancy   . Hypertension   . Mental disorder    Past Surgical History:  Procedure Laterality Date  . NO PAST SURGERIES    . TUBAL LIGATION  06/20/2011   Procedure: POST PARTUM TUBAL LIGATION;  Surgeon: Mora Bellman, MD;  Location: [facility];  Service: Gynecology;  Laterality: Bilateral;  Bilateral post partum tubal ligation     A IV  Location/Drains/Wounds Patient Lines/Drains/Airways Status   Active Line/Drains/Airways    Name:   Placement date:   Placement time:   Site:   Days:   Peripheral IV 08/24/18 Right;Upper Arm   08/24/18    0324    Arm   less than 1          Intake/Output Last 24 hours  Intake/Output Summary (Last 24 hours) at 08/24/2018 1047 Last data filed at 08/24/2018 0533 Gross per 24 hour  Intake 1000 ml  Output -  Net 1000 ml    Labs/Imaging Results for orders placed or performed during the hospital encounter of 08/23/18 (from the past 48 hour(s))  Urinalysis, Routine w reflex microscopic     Status: Abnormal   Collection Time: 08/24/18  2:49 AM  Result Value Ref Range   Color, Urine YELLOW YELLOW   APPearance HAZY (A) CLEAR   Specific Gravity, Urine 1.027 1.005 - 1.030   pH 5.0 5.0 - 8.0   Glucose, UA >=500 (A) NEGATIVE mg/dL   Hgb urine dipstick SMALL (A) NEGATIVE   Bilirubin Urine NEGATIVE NEGATIVE   Ketones, ur NEGATIVE NEGATIVE mg/dL   Protein, ur >=300 (A) NEGATIVE mg/dL   Nitrite NEGATIVE NEGATIVE   Leukocytes,Ua NEGATIVE NEGATIVE   RBC / HPF 0-5 0 - 5 RBC/hpf   WBC, UA 0-5 0 - 5 WBC/hpf   Bacteria, UA RARE (A) NONE SEEN   Squamous Epithelial / LPF 0-5 0 - 5    Comment: Performed  at [facility], [place] [address], [place], [place] [address]  Comprehensive metabolic panel     Status: Abnormal   Collection Time: 08/24/18  2:55 AM  Result Value Ref Range   Sodium 125 (L) 135 - 145 mmol/L   Potassium 5.4 (H) 3.5 - 5.1 mmol/L   Chloride 95 (L) 98 - 111 mmol/L   CO2 19 (L) 22 - 32 mmol/L   Glucose, Bld 660 (HH) 70 - 99 mg/dL    Comment: CRITICAL RESULT CALLED TO, READ BACK BY AND VERIFIED WITH: Allen Norris [phone] [id number] WILDERK    BUN 45 (H) 6 - 20 mg/dL   Creatinine, Ser 3.03 (H) 0.44 - 1.00 mg/dL   Calcium 8.7 (L) 8.9 - 10.3 mg/dL   Total Protein 7.6 6.5 - 8.1 g/dL   Albumin 2.7 (L) 3.5 - 5.0 g/dL   AST 12 (L) 15 - 41 U/L   ALT 15 0 - 44 U/L   Alkaline Phosphatase 93  38 - 126 U/L   Total Bilirubin 0.5 0.3 - 1.2 mg/dL   GFR calc non Af Amer 18 (L) >60 mL/min   GFR calc Af Amer 21 (L) >60 mL/min   Anion gap 11 5 - 15    Comment: Performed at [facility], [place] [address], [place], [place] [address]  CBC with Differential     Status: Abnormal   Collection Time: 08/24/18  2:55 AM  Result Value Ref Range   WBC 8.9 4.0 - 10.5 K/uL   RBC 4.35 3.87 - 5.11 MIL/uL   Hemoglobin 10.5 (L) 12.0 - 15.0 g/dL   HCT 33.2 (L) 36.0 - 46.0 %   MCV 76.3 (L) 80.0 - 100.0 fL   MCH 24.1 (L) 26.0 - 34.0 pg   MCHC 31.6 30.0 - 36.0 g/dL   RDW 14.0 11.5 - 15.5 %   Platelets 439 (H) 150 - 400 K/uL   nRBC 0.0 0.0 - 0.2 %   Neutrophils Relative % 67 %   Neutro Abs 6.0 1.7 - 7.7 K/uL   Lymphocytes Relative 24 %   Lymphs Abs 2.2 0.7 - 4.0 K/uL   Monocytes Relative 7 %   Monocytes Absolute 0.6 0.1 - 1.0 K/uL   Eosinophils Relative 1 %   Eosinophils Absolute 0.1 0.0 - 0.5 K/uL   Basophils Relative 0 %   Basophils Absolute 0.0 0.0 - 0.1 K/uL   Immature Granulocytes 1 %   Abs Immature Granulocytes 0.05 0.00 - 0.07 K/uL    Comment: Performed at [facility], [address] [place], [place] [address]  Lipase, blood     Status: Abnormal   Collection Time: 08/24/18  2:55 AM  Result Value Ref Range   Lipase 65 (H) 11 - 51 U/L    Comment: Performed at [facility] [address], [place], [place] [address]  I-Stat beta hCG blood, ED     Status: None   Collection Time: 08/24/18  3:01 AM  Result Value Ref Range   I-stat hCG, quantitative <5.0 <5 mIU/mL   Comment 3            Comment:   GEST. AGE      CONC.  (mIU/mL)   <=1 WEEK        5 - 50     2 WEEKS       50 - 500     3 WEEKS       100 - 10,000  4 WEEKS     1,000 - 30,000        FEMALE AND NON-PREGNANT FEMALE:     LESS THAN 5 mIU/mL   SARS CORONAVIRUS 2 Nasal Swab Aptima Multi Swab     Status: Abnormal   Collection Time: 08/24/18  3:48 AM   Specimen: Aptima Multi Swab; Nasal Swab  Result Value Ref  Range   SARS Coronavirus 2 POSITIVE (A) NEGATIVE    Comment: (NOTE) SARS-CoV-2 target nucleic acids are DETECTED. The SARS-CoV-2 RNA is generally detectable in upper and lower respiratory specimens during the acute phase of infection. Positive results are indicative of active infection with SARS-CoV-2. Clinical  correlation with patient history and other diagnostic information is necessary to determine patient infection status. Positive results do  not rule out bacterial infection or co-infection with other viruses. The expected result is Negative. Fact Sheet for Patients: SugarRoll.be Fact Sheet for Healthcare Providers: https://www.woods-mathews.com/ This test is not yet approved or cleared by the [place] FDA and  has been authorized for detection and/or diagnosis of SARS-CoV-2 by FDA under an Emergency Use Authorization (EUA). This EUA will remain  in effect (meaning this test can be used) for the duration of the COVID-19 declaration under Section 564(b)(1) of the Act, 21 U.S.C.  section 360bbb-3(b)(1), unless the authorization is terminated or revoked sooner. Performed at [facility], [place] [address] [place], [place] [address]   CBG monitoring, ED     Status: Abnormal   Collection Time: 08/24/18  5:27 AM  Result Value Ref Range   Glucose-Capillary 521 (HH) 70 - 99 mg/dL   Comment 1 Notify RN   SARS Coronavirus 2 [facility] order, Performed in [facility] hospital lab) Nasopharyngeal Nasopharyngeal Swab     Status: Abnormal   Collection Time: 08/24/18  6:35 AM   Specimen: Nasopharyngeal Swab  Result Value Ref Range   SARS Coronavirus 2 POSITIVE (A) NEGATIVE    Comment: RESULT CALLED TO, READ BACK BY AND VERIFIED WITH: Talbert Forest RN 10:20 08/24/18 ([email or web address]) (NOTE) If result is NEGATIVE SARS-CoV-2 target nucleic acids are NOT DETECTED. The SARS-CoV-2 RNA is generally detectable in upper and lower  respiratory specimens during  the acute phase of infection. The lowest  concentration of SARS-CoV-2 viral copies this assay can detect is 250  copies / mL. A negative result does not preclude SARS-CoV-2 infection  and should not be used as the sole basis for treatment or other  patient management decisions.  A negative result may occur with  improper specimen collection / handling, submission of specimen other  than nasopharyngeal swab, presence of viral mutation(s) within the  areas targeted by this assay, and inadequate number of viral copies  (<250 copies / mL). A negative result must be combined with clinical  observations, patient history, and epidemiological information. If result is POSITIVE SARS-CoV-2 target nucleic acids are DETECTED. The  SARS-CoV-2 RNA is generally detectable in upper and lower  respiratory specimens during the acute phase of infection.  Positive  results are indicative of active infection with SARS-CoV-2.  Clinical  correlation with patient history and other diagnostic information is  necessary to determine patient infection status.  Positive results do  not rule out bacterial infection or co-infection with other viruses. If result is PRESUMPTIVE POSTIVE SARS-CoV-2 nucleic acids MAY BE PRESENT.   A presumptive positive result was obtained on the submitted specimen  and confirmed on repeat testing.  While 2019 novel coronavirus  (SARS-CoV-2) nucleic acids may be present  in the submitted sample  additional confirmatory testing may be necessary for epidemiological  and / or clinical management purposes  to differentiate between  SARS-CoV-2 and other Sarbecovirus currently known to infect humans.  If clinically indicated additional testing with an alternate test  methodology [phone]) is a dvised. The SARS-CoV-2 RNA is generally  detectable in upper and lower respiratory specimens during the acute  phase of infection. The expected result is Negative. Fact Sheet for Patients:   StrictlyIdeas.no Fact Sheet for Healthcare Providers: BankingDealers.co.za This test is not yet approved or cleared by the [place] FDA and has been authorized for detection and/or diagnosis of SARS-CoV-2 by FDA under an Emergency Use Authorization (EUA).  This EUA will remain in effect (meaning this test can be used) for the duration of the COVID-19 declaration under Section 564(b)(1) of the Act, 21 U.S.C. section 360bbb-3(b)(1), unless the authorization is terminated or revoked sooner. Performed at [facility], [place] [address], [place], [place] [address]   CBG monitoring, ED     Status: Abnormal   Collection Time: 08/24/18  6:45 AM  Result Value Ref Range   Glucose-Capillary 438 (H) 70 - 99 mg/dL  CBG monitoring, ED     Status: Abnormal   Collection Time: 08/24/18  7:51 AM  Result Value Ref Range   Glucose-Capillary 323 (H) 70 - 99 mg/dL  CBG monitoring, ED     Status: Abnormal   Collection Time: 08/24/18  9:04 AM  Result Value Ref Range   Glucose-Capillary 510 (HH) 70 - 99 mg/dL   Comment 1 Notify RN   CBG monitoring, ED     Status: Abnormal   Collection Time: 08/24/18 10:12 AM  Result Value Ref Range   Glucose-Capillary 114 (H) 70 - 99 mg/dL   US Abdomen Complete  Result Date: 08/24/2018 CLINICAL DATA:  Abdominal pain, diabetes mellitus, hypertension EXAM: ABDOMEN ULTRASOUND COMPLETE COMPARISON:  CT abdomen pelvis 08/11/2018 FINDINGS: Gallbladder: Normally distended without stones or wall thickening. No pericholecystic fluid or sonographic Murphy sign. Common bile duct: Diameter: 5 mm diameter, normal Liver: Normal morphology without mass or nodularity. Portal vein is patent on color Doppler imaging with normal direction of blood flow towards the liver. IVC: Normal appearance Pancreas: Visualized portions of head through proximal tail normal appearance, remainder obscured by bowel gas Spleen: Normal appearance, 5.0 cm length  Right Kidney: Length: 9.3 cm. Normal cortical thickness. Increased cortical echogenicity. No mass or hydronephrosis. Left Kidney: Length: 9.8 cm. Normal cortical thickness. Increased cortical echogenicity. No mass or hydronephrosis Abdominal aorta: Visualized portion normal caliber, midportion obscured by bowel gas Other findings: No free fluid IMPRESSION: Medical renal disease changes. Incomplete pancreatic and aortic visualization. No acute abnormalities. Electronically Signed   By: Lavonia Dana M.D.   On: 08/24/2018 09:25   Dg Chest Port 1 View  Result Date: 08/24/2018 CLINICAL DATA:  Fever EXAM: PORTABLE CHEST 1 VIEW COMPARISON:  08/11/2018 FINDINGS: Heart and mediastinal contours are within normal limits. No focal opacities or effusions. No acute bony abnormality. IMPRESSION: No active disease. Electronically Signed   By: Rolm Baptise M.D.   On: 08/24/2018 03:13    Pending Labs Unresulted Labs (From admission, onward)    Start     Ordered   08/24/18 0956  Drugs of abuse scrn w alc, routine urine (ref lab)  Once,   STAT     08/24/18 0955   08/24/18 0955  Ferritin  Once,   STAT     08/24/18 0955  08/24/18 0955  Lactate dehydrogenase  Once,   STAT     08/24/18 0955   08/24/18 0955  D-dimer, quantitative (not at [facility])  Once,   STAT     08/24/18 0955   08/24/18 0655  Sodium, urine, random  ONCE - STAT,   STAT     08/24/18 0654   08/24/18 0655  Creatinine, urine, random  ONCE - STAT,   STAT     08/24/18 0654   08/24/18 0524  SARS CORONAVIRUS 2 Nasal Swab Aptima Multi Swab  (Asymptomatic/Tier 2 Patients Labs)  ONCE - STAT,   STAT    Question Answer Comment  Is this test for diagnosis or screening Screening   Symptomatic for COVID-19 as defined by CDC No   Hospitalized for COVID-19 No   Admitted to ICU for COVID-19 No   Previously tested for COVID-19 Yes   Resident in a congregate (group) care setting No   Employed in healthcare setting No   Pregnant No      08/24/18 0523   Signed and  Held  HIV antibody (Routine Testing)  Once,   R     Signed and Held   Signed and Held  Basic metabolic panel  Tomorrow morning,   R     Signed and Held   Signed and Held  CBC  Tomorrow morning,   R     Signed and Held   Signed and Held  CBC  (enoxaparin (LOVENOX)    CrCl >/= 30 ml/min)  Once,   R    Comments: Baseline for enoxaparin therapy IF NOT ALREADY DRAWN.  Notify MD if PLT < 100 K.    Signed and Held   Signed and Held  Creatinine, serum  (enoxaparin (LOVENOX)    CrCl >/= 30 ml/min)  Once,   R    Comments: Baseline for enoxaparin therapy IF NOT ALREADY DRAWN.    Signed and Held   Signed and Held  Creatinine, serum  (enoxaparin (LOVENOX)    CrCl >/= 30 ml/min)  Weekly,   R    Comments: while on enoxaparin therapy    Signed and Held   Signed and Held  Comprehensive metabolic panel  Once,   R     Signed and Held   Signed and Held  Magnesium  Once,   R     Signed and Held   Signed and Held  CBC WITH DIFFERENTIAL  Once,   R     Signed and Held   Signed and Held  Hemoglobin A1c  Once,   R     Signed and Held          Vitals/Pain Today's Vitals   08/24/18 0900 08/24/18 0902 08/24/18 1000 08/24/18 1028  BP: (!) 146/92  (!) 140/100   Pulse:   82   Resp: 13  13   Temp:      TempSrc:      SpO2:  99% 100%   Weight:      Height:      PainSc:    7     Isolation Precautions Airborne and Contact precautions  Medications Medications  dextrose 5 %-0.45 % sodium chloride infusion ( Intravenous New Bag/Given 08/24/18 1032)  insulin regular, human (MYXREDLIN) 100 units/ 100 mL infusion (0.5 Units/hr Intravenous Rate/Dose Change 08/24/18 1019)  dextrose 50 % solution 25 mL (has no administration in time range)  0.9 %  sodium chloride infusion ( Intravenous New Bag/Given 08/24/18 0543)  acetaminophen (  TYLENOL) tablet 650 mg (has no administration in time range)    Or  acetaminophen (TYLENOL) suppository 650 mg (has no administration in time range)  ondansetron (ZOFRAN) tablet 4 mg (  Oral See Alternative 08/24/18 1029)    Or  ondansetron (ZOFRAN) injection 4 mg (4 mg Intravenous Given 08/24/18 1029)  insulin regular bolus via infusion 0-10 Units (has no administration in time range)  0.9 %  sodium chloride infusion (has no administration in time range)  fentaNYL (SUBLIMAZE) injection 25 mcg (25 mcg Intravenous Given 08/24/18 1029)  sodium chloride 0.9 % bolus 1,000 mL (0 mLs Intravenous Stopped 08/24/18 0533)  ondansetron (ZOFRAN) injection 4 mg (4 mg Intravenous Given 08/24/18 0357)  acetaminophen (TYLENOL) tablet 650 mg (650 mg Oral Given 08/24/18 0407)  morphine 4 MG/ML injection 4 mg (4 mg Intravenous Given 08/24/18 0534)    Mobility walks Low fall risk   Focused Assessments    R Recommendations: See Admitting Provider Note  Report given to: Rodman Key RN  Additional Notes:

## 2018-08-24 NOTE — ED Notes (Signed)
Critical value BG 660, MD notified

## 2018-08-24 NOTE — ED Notes (Signed)
Admitting provider at bedside.

## 2018-08-24 NOTE — H&P (Signed)
History and Physical    [name] MYT:[id number] DOB: 01-17-1981 DOA: 08/23/2018  PCP: Patient, No Pcp Per  Patient coming from: Home.  Chief Complaint: Generalized body ache.  HPI: [name] is a 38 y.o. female with history of diabetes mellitus type 2 has not been on medication for last 7 years had come to the ER 2 weeks ago with complaint of generalized body ache abdominal discomfort and at that time patient was found to have infiltrates in the lung concerning for COVID-19.  Patient was discharged home on antibiotics which patient states she has taken regularly as prescribed.  In addition patient also was prescribed metformin for her diabetes.  Patient comes back to the ER today with complaints of worsening generalized body ache and has been taking NSAIDs as outpatient.  Patient also states she has been having poor appetite with nausea vomiting abdominal discomfort.  Discomfort is mostly periumbilical.  Prior to last ER visit patient states she was exposed to COVID-19 patient.  ED Course: In the ER blood work show blood sugar of 660 creatinine is further worsened to 3 from 2.6 anion gap is 11 lipase at 65 hemoglobin 10.5 platelets 439 UA unremarkable chest x-ray unremarkable.  CT abdomen pelvis done 2 weeks ago was unremarkable.  On exam patient abdomen appears soft no guarding or rigidity.  COVID-19 test is pending.  Patient was started on IV insulin infusion and IV fluid bolus for uncontrolled diabetes and renal failure.  Patient admitted for further management.  Review of Systems: As per HPI, rest all negative.   Past Medical History:  Diagnosis Date  . Anxiety   . Asthma   . Depression   . Diabetes mellitus   . Hyperemesis arising during pregnancy   . Hypertension   . Mental disorder     Past Surgical History:  Procedure Laterality Date  . NO PAST SURGERIES    . TUBAL LIGATION  06/20/2011   Procedure: POST PARTUM TUBAL LIGATION;  Surgeon: Mora Bellman, MD;  Location: [facility];  Service: Gynecology;  Laterality: Bilateral;  Bilateral post partum tubal ligation     reports that she has never smoked. She has never used smokeless tobacco. She reports that she does not drink alcohol or use drugs.  Allergies  Allergen Reactions  . Shellfish Allergy Anaphylaxis and Swelling  . Morphine And Related Nausea And Vomiting    Family History  Problem Relation Age of Onset  . Heart disease Mother        CHF  . Diabetes Sister   . Anesthesia problems Neg Hx     Prior to Admission medications   Medication Sig Start Date End Date Taking? Authorizing Provider  acetaminophen (TYLENOL) 325 MG tablet Take 2 tablets (650 mg total) by mouth every 6 (six) hours as needed. 10/21/16  Yes Jacqlyn Larsen, PA-C  metFORMIN (GLUCOPHAGE) 500 MG tablet Take 1 tablet (500 mg total) by mouth 2 (two) times daily with a meal. 08/11/18  Yes Jacqlyn Larsen, PA-C  amLODipine (NORVASC) 5 MG tablet Take 1 tablet (5 mg total) by mouth daily. Patient not taking: Reported on 08/24/2018 02/18/16   Velvet Bathe, MD  benzonatate (TESSALON PERLES) 100 MG capsule Take 1 capsule (100 mg total) by mouth 3 (three) times daily as needed for cough. Patient not taking: Reported on 02/14/2016 02/06/16   Mackuen, Courteney Lyn, MD  ferrous sulfate 325 (65 FE) MG tablet Take 1 tablet (325 mg total) by mouth 3 (three) times daily  with meals. Patient not taking: Reported on 08/24/2018 02/18/16   Velvet Bathe, MD  methocarbamol (ROBAXIN) 500 MG tablet Take 1 tablet (500 mg total) by mouth 2 (two) times daily. Patient not taking: Reported on 08/24/2018 10/21/16   Jacqlyn Larsen, PA-C  ondansetron (ZOFRAN ODT) 4 MG disintegrating tablet Take 1 tablet (4 mg total) by mouth every 8 (eight) hours as needed for nausea. Patient not taking: Reported on 08/24/2018 02/06/17   Tanna Furry, MD  promethazine-dextromethorphan (PROMETHAZINE-DM) 6.25-15 MG/5ML syrup Take 5 mLs by mouth 4 (four) times daily as needed for cough. Patient not  taking: Reported on 08/24/2018 02/06/17   Tanna Furry, MD  traMADol (ULTRAM) 50 MG tablet Take 1 tablet (50 mg total) by mouth every 6 (six) hours as needed. Patient not taking: Reported on 08/24/2018 02/06/17   Tanna Furry, MD    Physical Exam: Constitutional: Moderately built and nourished. Vitals:   08/23/18 2126 08/24/18 0132 08/24/18 0357 08/24/18 0538  BP: (!) 119/99 (!) 157/126 (!) 154/101   Pulse: (!) 109 (!) 110 96   Resp: 16 16 16    Temp: 98.2 F (36.8 C) 98.6 F (37 C)    TempSrc: Oral Oral    SpO2: 100% 100% 99%   Weight:    95.3 kg  Height:    5\' 7"  (1.702 m)   Eyes: Anicteric no pallor. ENMT: No discharge from the ears eyes nose or mouth. Neck: No mass or.  No neck rigidity. Respiratory: No rhonchi or crepitations. Cardiovascular: S1-S2 heard. Abdomen: Soft nontender bowel sounds present no guarding rigidity. Musculoskeletal: No edema. Skin: No rash. Neurologic: Alert awake oriented to time place and person.  Moves all extremities. Psychiatric: Appears normal per normal affect.   Labs on Admission: I have personally reviewed following labs and imaging studies  CBC: Recent Labs  Lab 08/24/18 0255  WBC 8.9  NEUTROABS 6.0  HGB 10.5*  HCT 33.2*  MCV 76.3*  PLT 903*   Basic Metabolic Panel: Recent Labs  Lab 08/24/18 0255  NA 125*  K 5.4*  CL 95*  CO2 19*  GLUCOSE 660*  BUN 45*  CREATININE 3.03*  CALCIUM 8.7*   GFR: Estimated Creatinine Clearance: 29.3 mL/min (A) (by C-G formula based on SCr of 3.03 mg/dL (H)). Liver Function Tests: Recent Labs  Lab 08/24/18 0255  AST 12*  ALT 15  ALKPHOS 93  BILITOT 0.5  PROT 7.6  ALBUMIN 2.7*   Recent Labs  Lab 08/24/18 0255  LIPASE 65*   No results for input(s): AMMONIA in the last 168 hours. Coagulation Profile: No results for input(s): INR, PROTIME in the last 168 hours. Cardiac Enzymes: No results for input(s): CKTOTAL, CKMB, CKMBINDEX, TROPONINI in the last 168 hours. BNP (last 3 results) No  results for input(s): PROBNP in the last 8760 hours. HbA1C: No results for input(s): HGBA1C in the last 72 hours. CBG: Recent Labs  Lab 08/24/18 0527  GLUCAP 521*   Lipid Profile: No results for input(s): CHOL, HDL, LDLCALC, TRIG, CHOLHDL, LDLDIRECT in the last 72 hours. Thyroid Function Tests: No results for input(s): TSH, T4TOTAL, FREET4, T3FREE, THYROIDAB in the last 72 hours. Anemia Panel: No results for input(s): VITAMINB12, FOLATE, FERRITIN, TIBC, IRON, RETICCTPCT in the last 72 hours. Urine analysis:    Component Value Date/Time   COLORURINE YELLOW 08/24/2018 0249   APPEARANCEUR HAZY (A) 08/24/2018 0249   LABSPEC 1.027 08/24/2018 0249   PHURINE 5.0 08/24/2018 0249   GLUCOSEU >=500 (A) 08/24/2018 0249   HGBUR SMALL (  A) 08/24/2018 0249   BILIRUBINUR NEGATIVE 08/24/2018 0249   KETONESUR NEGATIVE 08/24/2018 0249   PROTEINUR >=300 (A) 08/24/2018 0249   UROBILINOGEN 1.0 08/11/2013 1128   NITRITE NEGATIVE 08/24/2018 0249   LEUKOCYTESUR NEGATIVE 08/24/2018 0249   Sepsis Labs: @LABRCNTIP (procalcitonin:4,lacticidven:4) )No results found for this or any previous visit (from the past 240 hour(s)).   Radiological Exams on Admission: Dg Chest Port 1 View  Result Date: 08/24/2018 CLINICAL DATA:  Fever EXAM: PORTABLE CHEST 1 VIEW COMPARISON:  08/11/2018 FINDINGS: Heart and mediastinal contours are within normal limits. No focal opacities or effusions. No acute bony abnormality. IMPRESSION: No active disease. Electronically Signed   By: Rolm Baptise M.D.   On: 08/24/2018 03:13      Assessment/Plan Principal Problem:   ARF (acute renal failure) (HCC) Active Problems:   Uncontrolled type 2 diabetes mellitus with hyperglycemia ([place])    1. Acute renal failure likely from poor oral intake and use of NSAIDs.  Advised not to use NSAIDs.  Continue with hydration check FENa follow intake output metabolic panel. 2. Diabetes mellitus type 2 with hyperglycemia -check hemoglobin A1c  patient on insulin infusion.  Once blood sugar is less than 250 will discontinue infusion and start patient on long-acting subcu insulin. 3. Nausea vomiting with abdominal discomfort cause not clear differentials include possible developing pancreatitis given increased lipase.  Other causes could be gastroparesis from diabetes mellitus.  I have ordered sonogram of the abdomen.  Closely observe.  Recent CT scan was unremarkable. 4. Microcytic hypochromic anemia we will follow CBC check anemia panel. 5. Elevated blood pressure follow blood pressure trends PRN IV hydralazine for systolic blood pressure more than 160.  COVID-19 test is pending.   DVT prophylaxis: Lovenox. Code Status: Full code. Family Communication: Discussed with patient. Disposition Plan: Home. Consults called: None. Admission status: Observation.   Rise Patience MD [facility] Pager [phone].  If 7PM-7AM, please contact night-coverage [email or web address] Password [email or web address]  08/24/2018, 6:42 AM

## 2018-08-24 NOTE — ED Notes (Signed)
Abdomen US at bedside being performed.

## 2018-08-24 NOTE — ED Notes (Signed)
ED TO INPATIENT HANDOFF REPORT  ED Nurse Name and Phone #:  [phone]  S Name/Age/Gender [name] 38 y.o. female Room/Bed: 035C/035C  Code Status   Code Status: Prior  Home/SNF/Other Home Patient oriented to: self, place, time and situation Is this baseline? Yes   Triage Complete: Triage complete  Chief Complaint body pain, fever  Triage Note Patient here with body aches.  She states that she was here two weeks ago for the same. Patient states that she has had a test two weeks ago.  She has not heard the result.  She has a funny taste in her mouth.  She just is not feeling well and she has lost weight.   Allergies Allergies  Allergen Reactions  . Shellfish Allergy Anaphylaxis and Swelling  . Morphine And Related Nausea And Vomiting    Level of Care/Admitting Diagnosis ED Disposition    ED Disposition Condition Comment   Admit  Hospital Area: [facility] [[id number]]  Level of Care: Progressive [102]  I expect the patient will be discharged within 24 hours: No (not a candidate for 5C-Observation unit)  Covid Evaluation: Person Under Investigation (PUI)  Diagnosis: ARF (acute renal failure) ([place]) [427062]  Admitting Physician: Rise Patience [phone]  Attending Physician: Rise Patience [email or web address]  PT Class (Do Not Modify): Observation [104]  PT Acc Code (Do Not Modify): Observation [10022]       B Medical/Surgery History Past Medical History:  Diagnosis Date  . Anxiety   . Asthma   . Depression   . Diabetes mellitus   . Hyperemesis arising during pregnancy   . Hypertension   . Mental disorder    Past Surgical History:  Procedure Laterality Date  . NO PAST SURGERIES    . TUBAL LIGATION  06/20/2011   Procedure: POST PARTUM TUBAL LIGATION;  Surgeon: Mora Bellman, MD;  Location: [facility];  Service: Gynecology;  Laterality: Bilateral;  Bilateral post partum tubal ligation     A IV Location/Drains/Wounds Patient Lines/Drains/Airways  Status   Active Line/Drains/Airways    Name:   Placement date:   Placement time:   Site:   Days:   Peripheral IV 08/24/18 Right;Upper Arm   08/24/18    0324    Arm   less than 1          Intake/Output Last 24 hours  Intake/Output Summary (Last 24 hours) at 08/24/2018 8315 Last data filed at 08/24/2018 0533 Gross per 24 hour  Intake 1000 ml  Output -  Net 1000 ml    Labs/Imaging Results for orders placed or performed during the hospital encounter of 08/23/18 (from the past 48 hour(s))  Urinalysis, Routine w reflex microscopic     Status: Abnormal   Collection Time: 08/24/18  2:49 AM  Result Value Ref Range   Color, Urine YELLOW YELLOW   APPearance HAZY (A) CLEAR   Specific Gravity, Urine 1.027 1.005 - 1.030   pH 5.0 5.0 - 8.0   Glucose, UA >=500 (A) NEGATIVE mg/dL   Hgb urine dipstick SMALL (A) NEGATIVE   Bilirubin Urine NEGATIVE NEGATIVE   Ketones, ur NEGATIVE NEGATIVE mg/dL   Protein, ur >=300 (A) NEGATIVE mg/dL   Nitrite NEGATIVE NEGATIVE   Leukocytes,Ua NEGATIVE NEGATIVE   RBC / HPF 0-5 0 - 5 RBC/hpf   WBC, UA 0-5 0 - 5 WBC/hpf   Bacteria, UA RARE (A) NONE SEEN   Squamous Epithelial / LPF 0-5 0 - 5    Comment: Performed at [facility]  [facility], [place] [address], [place], [place] [address]  Comprehensive metabolic panel     Status: Abnormal   Collection Time: 08/24/18  2:55 AM  Result Value Ref Range   Sodium 125 (L) 135 - 145 mmol/L   Potassium 5.4 (H) 3.5 - 5.1 mmol/L   Chloride 95 (L) 98 - 111 mmol/L   CO2 19 (L) 22 - 32 mmol/L   Glucose, Bld 660 (HH) 70 - 99 mg/dL    Comment: CRITICAL RESULT CALLED TO, READ BACK BY AND VERIFIED WITH: Allen Norris [phone] [id number] WILDERK    BUN 45 (H) 6 - 20 mg/dL   Creatinine, Ser 3.03 (H) 0.44 - 1.00 mg/dL   Calcium 8.7 (L) 8.9 - 10.3 mg/dL   Total Protein 7.6 6.5 - 8.1 g/dL   Albumin 2.7 (L) 3.5 - 5.0 g/dL   AST 12 (L) 15 - 41 U/L   ALT 15 0 - 44 U/L   Alkaline Phosphatase 93 38 - 126 U/L   Total Bilirubin 0.5 0.3 - 1.2 mg/dL    GFR calc non Af Amer 18 (L) >60 mL/min   GFR calc Af Amer 21 (L) >60 mL/min   Anion gap 11 5 - 15    Comment: Performed at [facility], [place] [address] [place], [place] [address]  CBC with Differential     Status: Abnormal   Collection Time: 08/24/18  2:55 AM  Result Value Ref Range   WBC 8.9 4.0 - 10.5 K/uL   RBC 4.35 3.87 - 5.11 MIL/uL   Hemoglobin 10.5 (L) 12.0 - 15.0 g/dL   HCT 33.2 (L) 36.0 - 46.0 %   MCV 76.3 (L) 80.0 - 100.0 fL   MCH 24.1 (L) 26.0 - 34.0 pg   MCHC 31.6 30.0 - 36.0 g/dL   RDW 14.0 11.5 - 15.5 %   Platelets 439 (H) 150 - 400 K/uL   nRBC 0.0 0.0 - 0.2 %   Neutrophils Relative % 67 %   Neutro Abs 6.0 1.7 - 7.7 K/uL   Lymphocytes Relative 24 %   Lymphs Abs 2.2 0.7 - 4.0 K/uL   Monocytes Relative 7 %   Monocytes Absolute 0.6 0.1 - 1.0 K/uL   Eosinophils Relative 1 %   Eosinophils Absolute 0.1 0.0 - 0.5 K/uL   Basophils Relative 0 %   Basophils Absolute 0.0 0.0 - 0.1 K/uL   Immature Granulocytes 1 %   Abs Immature Granulocytes 0.05 0.00 - 0.07 K/uL    Comment: Performed at [facility], [address], [place], [place] [address]  Lipase, blood     Status: Abnormal   Collection Time: 08/24/18  2:55 AM  Result Value Ref Range   Lipase 65 (H) 11 - 51 U/L    Comment: Performed at [facility] [address], [place], [place] [address]  I-Stat beta hCG blood, ED     Status: None   Collection Time: 08/24/18  3:01 AM  Result Value Ref Range   I-stat hCG, quantitative <5.0 <5 mIU/mL   Comment 3            Comment:   GEST. AGE      CONC.  (mIU/mL)   <=1 WEEK        5 - 50     2 WEEKS       50 - 500     3 WEEKS       100 - 10,000  4 WEEKS     1,000 - 30,000        FEMALE AND NON-PREGNANT FEMALE:     LESS THAN 5 mIU/mL   CBG monitoring, ED     Status: Abnormal   Collection Time: 08/24/18  5:27 AM  Result Value Ref Range   Glucose-Capillary 521 (HH) 70 - 99 mg/dL   Comment 1 Notify RN   CBG monitoring, ED     Status: Abnormal    Collection Time: 08/24/18  6:45 AM  Result Value Ref Range   Glucose-Capillary 438 (H) 70 - 99 mg/dL   Dg Chest Port 1 View  Result Date: 08/24/2018 CLINICAL DATA:  Fever EXAM: PORTABLE CHEST 1 VIEW COMPARISON:  08/11/2018 FINDINGS: Heart and mediastinal contours are within normal limits. No focal opacities or effusions. No acute bony abnormality. IMPRESSION: No active disease. Electronically Signed   By: Rolm Baptise M.D.   On: 08/24/2018 03:13    Pending Labs Unresulted Labs (From admission, onward)    Start     Ordered   08/24/18 0655  Sodium, urine, random  ONCE - STAT,   STAT     08/24/18 0654   08/24/18 0655  Creatinine, urine, random  ONCE - STAT,   STAT     08/24/18 0654   08/24/18 0635  SARS Coronavirus 2 [facility] order, Performed in [facility] hospital lab) Nasopharyngeal Nasopharyngeal Swab  (Symptomatic/High Risk/Tier 1)  Once,   STAT    Question Answer Comment  Is this test for diagnosis or screening Diagnosis of ill patient   Symptomatic for COVID-19 as defined by CDC Yes   Date of Symptom Onset 08/20/2018   Hospitalized for COVID-19 No   Admitted to ICU for COVID-19 No   Previously tested for COVID-19 Yes   Resident in a congregate (group) care setting No   Employed in healthcare setting Yes   Pregnant No      08/24/18 0636   08/24/18 0524  SARS CORONAVIRUS 2 Nasal Swab Aptima Multi Swab  (Asymptomatic/Tier 2 Patients Labs)  ONCE - STAT,   STAT    Question Answer Comment  Is this test for diagnosis or screening Screening   Symptomatic for COVID-19 as defined by CDC No   Hospitalized for COVID-19 No   Admitted to ICU for COVID-19 No   Previously tested for COVID-19 Yes   Resident in a congregate (group) care setting No   Employed in healthcare setting No   Pregnant No      08/24/18 0523   08/24/18 0313  SARS CORONAVIRUS 2 Nasal Swab Aptima Multi Swab  (Asymptomatic/Tier 2)  Once,   STAT    Question Answer Comment  Is this test for diagnosis or screening  Screening   Symptomatic for COVID-19 as defined by CDC No   Hospitalized for COVID-19 No   Admitted to ICU for COVID-19 No   Previously tested for COVID-19 No   Resident in a congregate (group) care setting No   Employed in healthcare setting No   Pregnant No      08/24/18 0312   Signed and Held  HIV antibody (Routine Testing)  Once,   R     Signed and Held   Signed and Held  Basic metabolic panel  Tomorrow morning,   R     Signed and Held   Signed and Held  CBC  Tomorrow morning,   R     Signed and Held   Signed and Held  CBC  (  enoxaparin (LOVENOX)    CrCl >/= 30 ml/min)  Once,   R    Comments: Baseline for enoxaparin therapy IF NOT ALREADY DRAWN.  Notify MD if PLT < 100 K.    Signed and Held   Signed and Held  Creatinine, serum  (enoxaparin (LOVENOX)    CrCl >/= 30 ml/min)  Once,   R    Comments: Baseline for enoxaparin therapy IF NOT ALREADY DRAWN.    Signed and Held   Signed and Held  Creatinine, serum  (enoxaparin (LOVENOX)    CrCl >/= 30 ml/min)  Weekly,   R    Comments: while on enoxaparin therapy    Signed and Held   Signed and Held  Comprehensive metabolic panel  Once,   R     Signed and Held   Signed and Held  Magnesium  Once,   R     Signed and Held   Signed and Held  CBC WITH DIFFERENTIAL  Once,   R     Signed and Held   Signed and Held  Hemoglobin A1c  Once,   R     Signed and Held          Vitals/Pain Today's Vitals   08/24/18 0430 08/24/18 0445 08/24/18 0538 08/24/18 0630  BP: 137/76 (!) 141/73  (!) 146/80  Pulse:      Resp: 16 18  13   Temp:      TempSrc:      SpO2:      Weight:   95.3 kg   Height:   5\' 7"  (1.702 m)   PainSc:        Isolation Precautions Airborne and Contact precautions  Medications Medications  dextrose 5 %-0.45 % sodium chloride infusion (has no administration in time range)  insulin regular bolus via infusion 0-10 Units (0 Units Intravenous Not Given 08/24/18 0741)  insulin regular, human (MYXREDLIN) 100 units/ 100 mL  infusion (3.8 Units/hr Intravenous Rate/Dose Change 08/24/18 0647)  dextrose 50 % solution 25 mL (has no administration in time range)  0.9 %  sodium chloride infusion ( Intravenous New Bag/Given 08/24/18 0543)  sodium chloride 0.9 % bolus 1,000 mL (0 mLs Intravenous Stopped 08/24/18 0533)  ondansetron (ZOFRAN) injection 4 mg (4 mg Intravenous Given 08/24/18 0357)  acetaminophen (TYLENOL) tablet 650 mg (650 mg Oral Given 08/24/18 0407)  morphine 4 MG/ML injection 4 mg (4 mg Intravenous Given 08/24/18 0534)    Mobility walks Low fall risk   Focused Assessments    R Recommendations: See Admitting Provider Note  Report given to:   Additional Notes:

## 2018-08-24 NOTE — ED Notes (Signed)
Attempted to call report to [facility], was told the RN would have to call this RN back, I informed them Care link just left with the pt and would be arriving soon.

## 2018-08-24 NOTE — ED Notes (Signed)
Urine culture sent down to the lab with urine sample

## 2018-08-24 NOTE — Progress Notes (Signed)
Inpatient Diabetes Program Recommendations  AACE/ADA: New Consensus Statement on Inpatient Glycemic Control (2015)  Target Ranges:  Prepandial:   less than 140 mg/dL      Peak postprandial:   less than 180 mg/dL (1-2 hours)      Critically ill patients:  140 - 180 mg/dL   Results for LORAINA, STAUFFER (MRN [id number]) as of 08/24/2018 11:42  Ref. Range 08/24/2018 05:27 08/24/2018 06:45 08/24/2018 07:51 08/24/2018 09:04 08/24/2018 10:12  Glucose-Capillary Latest Ref Range: 70 - 99 mg/dL 521 (HH) 438 (H) 323 (H) 510 (HH) 114 (H)    Review of Glycemic Control  Diabetes history: DM 2 Outpatient Diabetes medications: Metformin 500 mg bid (prescribed 7/24 glucose 253 at this visit) Current orders for Inpatient glycemic control: IV insulin gtt  Consult for insulin and DM teaching Transferred from [facility] ED.  A1c in process, pt has been on steroids  Admitting glucose >600, however patient prescribed Decadron 6 mg daily for 10 days when she came to ED on 7/24.  Patient has Hx of DM and per previous notes from last ED visit has been on insulin in the past. Patient has glucometer and supplies at home. Will follow patient and touch base prior to d/c.  Thanks,  Tama Headings RN, MSN, BC-ADM Inpatient Diabetes Coordinator Team Pager [phone] (8a-5 pm)

## 2018-08-25 DIAGNOSIS — R6889 Other general symptoms and signs: Secondary | ICD-10-CM

## 2018-08-25 DIAGNOSIS — E1110 Type 2 diabetes mellitus with ketoacidosis without coma: Secondary | ICD-10-CM

## 2018-08-25 LAB — COMPREHENSIVE METABOLIC PANEL
ALT: 11 U/L (ref 0–44)
AST: 10 U/L — ABNORMAL LOW (ref 15–41)
Albumin: 2.2 g/dL — ABNORMAL LOW (ref 3.5–5.0)
Alkaline Phosphatase: 62 U/L (ref 38–126)
Anion gap: 5 (ref 5–15)
BUN: 35 mg/dL — ABNORMAL HIGH (ref 6–20)
CO2: 21 mmol/L — ABNORMAL LOW (ref 22–32)
Calcium: 7.8 mg/dL — ABNORMAL LOW (ref 8.9–10.3)
Chloride: 106 mmol/L (ref 98–111)
Creatinine, Ser: 2.38 mg/dL — ABNORMAL HIGH (ref 0.44–1.00)
GFR calc Af Amer: 29 mL/min — ABNORMAL LOW (ref >60)
GFR calc non Af Amer: 25 mL/min — ABNORMAL LOW (ref >60)
Glucose, Bld: 249 mg/dL — ABNORMAL HIGH (ref 70–99)
Potassium: 4.4 mmol/L (ref 3.5–5.1)
Sodium: 132 mmol/L — ABNORMAL LOW (ref 135–145)
Total Bilirubin: 0.3 mg/dL (ref 0.3–1.2)
Total Protein: 5.9 g/dL — ABNORMAL LOW (ref 6.5–8.1)

## 2018-08-25 LAB — SODIUM, URINE, RANDOM: Sodium, Ur: 29 mmol/L

## 2018-08-25 LAB — D-DIMER, QUANTITATIVE: D-Dimer, Quant: 1.54 ug/mL-FEU — ABNORMAL HIGH (ref 0.00–0.50)

## 2018-08-25 LAB — LACTATE DEHYDROGENASE: LDH: 124 U/L (ref 98–192)

## 2018-08-25 LAB — MAGNESIUM: Magnesium: 1.9 mg/dL (ref 1.7–2.4)

## 2018-08-25 LAB — HIV ANTIBODY (ROUTINE TESTING W REFLEX): HIV Screen 4th Generation wRfx: NONREACTIVE

## 2018-08-25 LAB — CBC WITH DIFFERENTIAL/PLATELET
Abs Immature Granulocytes: 0.03 10*3/uL (ref 0.00–0.07)
Basophils Absolute: 0 10*3/uL (ref 0.0–0.1)
Basophils Relative: 0 %
Eosinophils Absolute: 0.1 10*3/uL (ref 0.0–0.5)
Eosinophils Relative: 1 %
HCT: 24.8 % — ABNORMAL LOW (ref 36.0–46.0)
Hemoglobin: 7.7 g/dL — ABNORMAL LOW (ref 12.0–15.0)
Immature Granulocytes: 0 %
Lymphocytes Relative: 27 %
Lymphs Abs: 2.2 10*3/uL (ref 0.7–4.0)
MCH: 23.6 pg — ABNORMAL LOW (ref 26.0–34.0)
MCHC: 31 g/dL (ref 30.0–36.0)
MCV: 76.1 fL — ABNORMAL LOW (ref 80.0–100.0)
Monocytes Absolute: 0.6 10*3/uL (ref 0.1–1.0)
Monocytes Relative: 7 %
Neutro Abs: 5.3 10*3/uL (ref 1.7–7.7)
Neutrophils Relative %: 65 %
Platelets: 339 10*3/uL (ref 150–400)
RBC: 3.26 MIL/uL — ABNORMAL LOW (ref 3.87–5.11)
RDW: 14.2 % (ref 11.5–15.5)
WBC: 8.2 10*3/uL (ref 4.0–10.5)
nRBC: 0 % (ref 0.0–0.2)

## 2018-08-25 LAB — GLUCOSE, CAPILLARY
Glucose-Capillary: 170 mg/dL — ABNORMAL HIGH (ref 70–99)
Glucose-Capillary: 203 mg/dL — ABNORMAL HIGH (ref 70–99)
Glucose-Capillary: 300 mg/dL — ABNORMAL HIGH (ref 70–99)
Glucose-Capillary: 330 mg/dL — ABNORMAL HIGH (ref 70–99)

## 2018-08-25 LAB — BRAIN NATRIURETIC PEPTIDE: B Natriuretic Peptide: 28 pg/mL (ref 0.0–100.0)

## 2018-08-25 LAB — FERRITIN: Ferritin: 70 ng/mL (ref 11–307)

## 2018-08-25 LAB — C-REACTIVE PROTEIN: CRP: 1 mg/dL — ABNORMAL HIGH (ref <1.0)

## 2018-08-25 MED ORDER — INSULIN GLARGINE 100 UNIT/ML ~~LOC~~ SOLN
30.0000 [IU] | Freq: Every day | SUBCUTANEOUS | Status: DC
  Filled 2018-08-25: qty 0.3

## 2018-08-25 MED ORDER — HYDRALAZINE HCL 50 MG PO TABS: 50.0000 mg | ORAL_TABLET | Freq: Three times a day (TID) | ORAL | 0 refills | Status: DC

## 2018-08-25 MED ORDER — AMLODIPINE BESYLATE 10 MG PO TABS: 10.0000 mg | ORAL_TABLET | Freq: Every day | ORAL | 0 refills | Status: DC

## 2018-08-25 MED ORDER — INSULIN GLARGINE 100 UNIT/ML ~~LOC~~ SOLN: 40.0000 [IU] | Freq: Every day | SUBCUTANEOUS | 0 refills | Status: DC

## 2018-08-25 MED ORDER — HYDRALAZINE HCL 50 MG PO TABS
50.0000 mg | ORAL_TABLET | Freq: Three times a day (TID) | ORAL | Status: DC
  Administered 2018-08-25: 50 mg via ORAL

## 2018-08-25 MED ORDER — AMLODIPINE BESYLATE 5 MG PO TABS
10.0000 mg | ORAL_TABLET | Freq: Every day | ORAL | Status: DC
  Administered 2018-08-25: 10 mg via ORAL
  Filled 2018-08-25: qty 2

## 2018-08-25 MED ORDER — "INSULIN SYRINGE-NEEDLE U-100 25G X 1"" 1 ML MISC": 0 refills | Status: DC

## 2018-08-25 MED ORDER — INSULIN GLARGINE 100 UNIT/ML ~~LOC~~ SOLN
40.0000 [IU] | Freq: Every day | SUBCUTANEOUS | Status: DC
  Administered 2018-08-25: 40 [IU] via SUBCUTANEOUS
  Filled 2018-08-25: qty 0.4

## 2018-08-25 MED ORDER — INSULIN ASPART 100 UNIT/ML ~~LOC~~ SOLN: SUBCUTANEOUS | 0 refills | Status: DC

## 2018-08-25 NOTE — Discharge Planning (Signed)
Patient IV and tele removed.  RN assessment and VS revealed stability for DC to home w/ son.  Discussed importance of self monitoring and any others in household.  Talk about need to continue quarantine for another 2 weeks, no working outside of home and wearing a mask when absolutely necessary to be around others.  Told of suggest FU with PCP and when to contact Dr. Or return  To the ED. Talked about importance of checking glucose befoe each meal and at bedtime. Also explained sliding scale and to contact Dr. If continues to need to use the top end of the scale.  Paper scripts given, per patient request. Patient agreed and signed Health Dept discharge contract - placed on chart.  Once ready and ride arrives, will be wheeled to front and family transporting home via car.

## 2018-08-25 NOTE — Discharge Instructions (Signed)
Follow with Primary MD  in 7 days   Get CBC, CMP, 2 view Chest X ray -  checked next visit within 1 week by Primary MD  Activity: As tolerated with Full fall precautions use walker/cane & assistance as needed  Disposition Home   Diet: Heart Healthy Low Carb  Accuchecks 4 times/day, Once in AM empty stomach and then before each meal. Log in all results and show them to your Prim.MD in 3 days. If any glucose reading is under 80 or above 300 call your Prim MD immidiately. Follow Low glucose instructions for glucose under 80 as instructed.   Special Instructions: If you have smoked or chewed Tobacco  in the last 2 yrs please stop smoking, stop any regular Alcohol  and or any Recreational drug use.  On your next visit with your primary care physician please Get Medicines reviewed and adjusted.  Please request your Prim.MD to go over all Hospital Tests and Procedure/Radiological results at the follow up, please get all Hospital records sent to your Prim MD by signing hospital release before you go home.  If you experience worsening of your admission symptoms, develop shortness of breath, life threatening emergency, suicidal or homicidal thoughts you must seek medical attention immediately by calling 911 or calling your MD immediately  if symptoms less severe.  You Must read complete instructions/literature along with all the possible adverse reactions/side effects for all the Medicines you take and that have been prescribed to you. Take any new Medicines after you have completely understood and accpet all the possible adverse reactions/side effects.       Person Under Monitoring Name: [name]  Location: [address] [place] [address]   Infection Prevention Recommendations for Individuals Confirmed to have, or Being Evaluated for, 2019 Novel Coronavirus (COVID-19) Infection Who Receive Care at Home  Individuals who are confirmed to have, or are being evaluated for, COVID-19  should follow the prevention steps below until a healthcare provider or local or state health department says they can return to normal activities.  Stay home except to get medical care You should restrict activities outside your home, except for getting medical care. Do not go to work, school, or public areas, and do not use public transportation or taxis.  Call ahead before visiting your doctor Before your medical appointment, call the healthcare provider and tell them that you have, or are being evaluated for, COVID-19 infection. This will help the healthcare providers office take steps to keep other people from getting infected. Ask your healthcare provider to call the local or state health department.  Monitor your symptoms Seek prompt medical attention if your illness is worsening (e.g., difficulty breathing). Before going to your medical appointment, call the healthcare provider and tell them that you have, or are being evaluated for, COVID-19 infection. Ask your healthcare provider to call the local or state health department.  Wear a facemask You should wear a facemask that covers your nose and mouth when you are in the same room with other people and when you visit a healthcare provider. People who live with or visit you should also wear a facemask while they are in the same room with you.  Separate yourself from other people in your home As much as possible, you should stay in a different room from other people in your home. Also, you should use a separate bathroom, if available.  Avoid sharing household items You should not share dishes, drinking glasses, cups, eating utensils, towels,  bedding, or other items with other people in your home. After using these items, you should wash them thoroughly with soap and water.  Cover your coughs and sneezes Cover your mouth and nose with a tissue when you cough or sneeze, or you can cough or sneeze into your sleeve. Throw used  tissues in a lined trash can, and immediately wash your hands with soap and water for at least 20 seconds or use an alcohol-based hand rub.  Wash your [facility] your hands often and thoroughly with soap and water for at least 20 seconds. You can use an alcohol-based hand sanitizer if soap and water are not available and if your hands are not visibly dirty. Avoid touching your eyes, nose, and mouth with unwashed hands.   Prevention Steps for Caregivers and Household Members of Individuals Confirmed to have, or Being Evaluated for, COVID-19 Infection Being Cared for in the Home  If you live with, or provide care at home for, a person confirmed to have, or being evaluated for, COVID-19 infection please follow these guidelines to prevent infection:  Follow healthcare providers instructions Make sure that you understand and can help the patient follow any healthcare provider instructions for all care.  Provide for the patients basic needs You should help the patient with basic needs in the home and provide support for getting groceries, prescriptions, and other personal needs.  Monitor the patients symptoms If they are getting sicker, call his or her medical provider and tell them that the patient has, or is being evaluated for, COVID-19 infection. This will help the healthcare providers office take steps to keep other people from getting infected. Ask the healthcare provider to call the local or state health department.  Limit the number of people who have contact with the patient  If possible, have only one caregiver for the patient.  Other household members should stay in another home or place of residence. If this is not possible, they should stay  in another room, or be separated from the patient as much as possible. Use a separate bathroom, if available.  Restrict visitors who do not have an essential need to be in the home.  Keep older adults, very young children, and  other sick people away from the patient Keep older adults, very young children, and those who have compromised immune systems or chronic health conditions away from the patient. This includes people with chronic heart, lung, or kidney conditions, diabetes, and cancer.  Ensure good ventilation Make sure that shared spaces in the home have good air flow, such as from an air conditioner or an opened window, weather permitting.  Wash your hands often  Wash your hands often and thoroughly with soap and water for at least 20 seconds. You can use an alcohol based hand sanitizer if soap and water are not available and if your hands are not visibly dirty.  Avoid touching your eyes, nose, and mouth with unwashed hands.  Use disposable paper towels to dry your hands. If not available, use dedicated cloth towels and replace them when they become wet.  Wear a facemask and gloves  Wear a disposable facemask at all times in the room and gloves when you touch or have contact with the patients blood, body fluids, and/or secretions or excretions, such as sweat, saliva, sputum, nasal mucus, vomit, urine, or feces.  Ensure the mask fits over your nose and mouth tightly, and do not touch it during use.  Throw out disposable facemasks and  gloves after using them. Do not reuse.  Wash your hands immediately after removing your facemask and gloves.  If your personal clothing becomes contaminated, carefully remove clothing and launder. Wash your hands after handling contaminated clothing.  Place all used disposable facemasks, gloves, and other waste in a lined container before disposing them with other household waste.  Remove gloves and wash your hands immediately after handling these items.  Do not share dishes, glasses, or other household items with the patient  Avoid sharing household items. You should not share dishes, drinking glasses, cups, eating utensils, towels, bedding, or other items with a patient  who is confirmed to have, or being evaluated for, COVID-19 infection.  After the person uses these items, you should wash them thoroughly with soap and water.  Wash laundry thoroughly  Immediately remove and wash clothes or bedding that have blood, body fluids, and/or secretions or excretions, such as sweat, saliva, sputum, nasal mucus, vomit, urine, or feces, on them.  Wear gloves when handling laundry from the patient.  Read and follow directions on labels of laundry or clothing items and detergent. In general, wash and dry with the warmest temperatures recommended on the label.  Clean all areas the individual has used often  Clean all touchable surfaces, such as counters, tabletops, doorknobs, bathroom fixtures, toilets, phones, keyboards, tablets, and bedside tables, every day. Also, clean any surfaces that may have blood, body fluids, and/or secretions or excretions on them.  Wear gloves when cleaning surfaces the patient has come in contact with.  Use a diluted bleach solution (e.g., dilute bleach with 1 part bleach and 10 parts water) or a household disinfectant with a label that says EPA-registered for coronaviruses. To make a bleach solution at home, add 1 tablespoon of bleach to 1 quart (4 cups) of water. For a larger supply, add  cup of bleach to 1 gallon (16 cups) of water.  Read labels of cleaning products and follow recommendations provided on product labels. Labels contain instructions for safe and effective use of the cleaning product including precautions you should take when applying the product, such as wearing gloves or eye protection and making sure you have good ventilation during use of the product.  Remove gloves and wash hands immediately after cleaning.  Monitor yourself for signs and symptoms of illness Caregivers and household members are considered close contacts, should monitor their health, and will be asked to limit movement outside of the home to the extent  possible. Follow the monitoring steps for close contacts listed on the symptom monitoring form.   ? If you have additional questions, contact your local health department or call the epidemiologist on call at [phone] (available 24/7). ? This guidance is subject to change. For the most up-to-date guidance from [facility], please refer to their website: [email or web address]

## 2018-08-25 NOTE — Discharge Summary (Signed)
[name] HKF:[id number] DOB: [birth year] DOA: 08/23/2018  PCP: Patient, No Pcp Per  Admit date: 08/23/2018  Discharge date: 08/25/2018  Admitted From: Home   Disposition:  Home   Recommendations for Outpatient Follow-up:   Follow up with PCP in 1-2 weeks  PCP Please obtain BMP/CBC, 2 view CXR in 1week,  (see Discharge instructions)   PCP Please follow up on the following pending results: CBGs, BP, BMP   Home Health: None   Equipment/Devices: None  Consultations: None  Discharge Condition: Stable    CODE STATUS: Full    Diet Recommendation: Heart Healthy Low Carb   Chief Complaint  Patient presents with  . Generalized Body Aches     Brief history of present illness from the day of admission and additional interim summary    [name] is a 38 y.o. female with history of diabetes mellitus type 2 has not been on medication for last 7 years had come to the ER 2 weeks ago with complaint of generalized body ache abdominal discomfort and at that time patient was found to have infiltrates in the lung concerning for COVID-19 , was found to be in DKA, she had no pulmonary symptoms whatsoever and was admitted for DKA treatment.                                                                 Hospital Course    DKA in a type 2 diabetes mellitus patient with poor outpatient control due to hyperglycemia A1c of 12.5 and not compliant with her medications.  She was treated with DKA protocol received IV fluids along with Lantus and sliding scale, diabetic insulin education provided, gap has closed currently on Lantus and sliding scale, insulin supplies diabetic teaching will be provided.  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  Requested to maintain a logbook  with [employer] [facility] Accu-Cheks and follow with PCP within a week with a logbook.  Counseled on compliance.   Incidental COVID-19 pneumonitis.  Asymptomatic.  Stable inflammatory markers.  Told to seek help if she gets hypoxic in the future.  Currently no treatment.  Morbid obesity.  Outpatient follow-up with PCP.  CKD 3/4.  Unclear baseline last creatinine in the system is around 2.5, she was in AKI here improved with hydration follow with PCP.  Will benefit from outpatient nephrology follow-up will defer this to PCP.  Poorly controlled essential hypertension.  Placed on Norvasc and hydralazine combination with outpatient PCP follow-up.  Nonspecific abdominal discomfort during admission due to DKA completely resolved.  CT abdomen pelvis negative.  Asymptomatic tolerating diet.  Discharge diagnosis     Principal Problem:   ARF (acute renal failure) (HCC) Active Problems:   Uncontrolled type 2 diabetes mellitus  with hyperglycemia ([place])   DKA (diabetic ketoacidoses) [facility])    Discharge instructions    Discharge Instructions    Discharge instructions   Complete by: As directed    Follow with Primary MD  in 7 days   Get CBC, CMP, 2 view Chest X ray -  checked next visit within 1 week by Primary MD  Activity: As tolerated with Full fall precautions use walker/cane & assistance as needed  Disposition Home   Diet: Heart Healthy Low Carb  Accuchecks 4 times/day, Once in AM empty stomach and then before each meal. Log in all results and show them to your Prim.MD in 3 days. If any glucose reading is under 80 or above 300 call your Prim MD immidiately. Follow Low glucose instructions for glucose under 80 as instructed.   Special Instructions: If you have smoked or chewed Tobacco  in the last 2 yrs please stop smoking, stop any regular Alcohol  and or any Recreational drug use.  On your next visit with your primary care physician please Get Medicines reviewed and adjusted.  Please request  your Prim.MD to go over all Hospital Tests and Procedure/Radiological results at the follow up, please get all Hospital records sent to your Prim MD by signing hospital release before you go home.  If you experience worsening of your admission symptoms, develop shortness of breath, life threatening emergency, suicidal or homicidal thoughts you must seek medical attention immediately by calling 911 or calling your MD immediately  if symptoms less severe.   Increase activity slowly   Complete by: As directed       Discharge Medications   Allergies as of 08/25/2018      Reactions   Shellfish Allergy Anaphylaxis, Swelling   Morphine And Related Nausea And Vomiting      Medication List    TAKE these medications   acetaminophen 325 MG tablet Commonly known as: Tylenol Take 2 tablets (650 mg total) by mouth every 6 (six) hours as needed.   amLODipine 10 MG tablet Commonly known as: NORVASC Take 1 tablet (10 mg total) by mouth daily. What changed:   medication strength  how much to take   benzonatate 100 MG capsule Commonly known as: Tessalon Perles Take 1 capsule (100 mg total) by mouth 3 (three) times daily as needed for cough.   ferrous sulfate 325 (65 FE) MG tablet Take 1 tablet (325 mg total) by mouth 3 (three) times daily with meals.   hydrALAZINE 50 MG tablet Commonly known as: APRESOLINE Take 1 tablet (50 mg total) by mouth 3 (three) times daily.   insulin aspart 100 UNIT/ML injection Commonly known as: NovoLOG Before each meal 3 times a day , 140-199 - 2 units, 200-250 - 4 units, 251-299 - 6 units,  300-349 - 8 units,  350 or above 10 units. Insulin PEN if approved, provide syringes and needles if needed. Can switch to any other cheaper alternative.   insulin glargine 100 UNIT/ML injection Commonly known as: Lantus Inject 0.4 mLs (40 Units total) into the skin at bedtime. Dispense insulin pen if approved, if not dispense as needed syringes and needles for 1 month  supply. Can switch to Levemir. Diagnosis E 11.65.   Insulin Syringe-Needle U-100 25G X 1" 1 ML Misc For 4 times a day insulin SQ, 1 month supply. Diagnosis E11.65   metFORMIN 500 MG tablet Commonly known as: GLUCOPHAGE Take 1 tablet (500 mg total) by mouth 2 (two) times daily with a meal.  methocarbamol 500 MG tablet Commonly known as: ROBAXIN Take 1 tablet (500 mg total) by mouth 2 (two) times daily.   ondansetron 4 MG disintegrating tablet Commonly known as: Zofran ODT Take 1 tablet (4 mg total) by mouth every 8 (eight) hours as needed for nausea.   promethazine-dextromethorphan 6.25-15 MG/5ML syrup Commonly known as: PROMETHAZINE-DM Take 5 mLs by mouth 4 (four) times daily as needed for cough.   traMADol 50 MG tablet Commonly known as: ULTRAM Take 1 tablet (50 mg total) by mouth every 6 (six) hours as needed.       Follow-up Information    Your PCP. Schedule an appointment as soon as possible for a visit in 1 week(s).           Major procedures and Radiology Reports - PLEASE review detailed and final reports thoroughly  -         Ct Abdomen Pelvis Wo Contrast  Result Date: 08/11/2018 CLINICAL DATA:  Ct a/p wo, Pt endorses body aches,chills for several days. Pt was tested earlier today but will not know results for several days. 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 Hypertensive and hx of same., EXAM: CT ABDOMEN AND PELVIS WITHOUT CONTRAST TECHNIQUE: Multidetector CT imaging of the abdomen and pelvis was performed following the standard protocol without IV contrast. COMPARISON:  None. FINDINGS: Lower chest: Images of the LOWER lungs show patchy focal areas of ground-glass opacity involving the visualized UPPER and LOWER lobes. There is no discrete consolidation. No pleural effusions. Heart size is normal. Hepatobiliary: No focal liver abnormality is seen. No radiopaque gallstones, biliary dilatation, or pericholecystic inflammatory changes.  Pancreas: Unremarkable. No pancreatic ductal dilatation or surrounding inflammatory changes. Spleen: Normal in size without focal abnormality. Adrenals/Urinary Tract: Adrenal glands are unremarkable. Kidneys are normal, without renal calculi, focal lesion, or hydronephrosis. Bladder is unremarkable. Stomach/Bowel: The stomach and small bowel loops are normal in appearance. Loops of colon are unremarkable. The appendix is well seen and has a normal appearance. Vascular/Lymphatic: No significant vascular findings are present. No enlarged abdominal or pelvic lymph nodes. Reproductive: The uterus is present. No adnexal mass. Other: No abdominal wall hernia or abnormality. No abdominopelvic ascites. Musculoskeletal: No acute or significant osseous findings. IMPRESSION: 1. Lung bases are abnormal, with multifocal ground-glass opacities, consistent with inflammatory or infectious process. Consider Covid-19. 2. Normal appendix. 3. No bowel obstruction. The salient findings were discussed with KELSEY FORD on 08/11/2018 at 6:04 pm. We discussed the possibility of Covid-19. Electronically Signed   By: Nolon Nations M.D.   On: 08/11/2018 18:05   US Abdomen Complete  Result Date: 08/24/2018 CLINICAL DATA:  Abdominal pain, diabetes mellitus, hypertension EXAM: ABDOMEN ULTRASOUND COMPLETE COMPARISON:  CT abdomen pelvis 08/11/2018 FINDINGS: Gallbladder: Normally distended without stones or wall thickening. No pericholecystic fluid or sonographic Murphy sign. Common bile duct: Diameter: 5 mm diameter, normal Liver: Normal morphology without mass or nodularity. Portal vein is patent on color Doppler imaging with normal direction of blood flow towards the liver. IVC: Normal appearance Pancreas: Visualized portions of head through proximal tail normal appearance, remainder obscured by bowel gas Spleen: Normal appearance, 5.0 cm length Right Kidney: Length: 9.3 cm. Normal cortical thickness. Increased cortical echogenicity. No mass  or hydronephrosis. Left Kidney: Length: 9.8 cm. Normal cortical thickness. Increased cortical echogenicity. No mass or hydronephrosis Abdominal aorta: Visualized portion normal caliber, midportion obscured by bowel gas Other findings: No free fluid IMPRESSION: Medical renal disease changes. Incomplete pancreatic and aortic visualization.  No acute abnormalities. Electronically Signed   By: Lavonia Dana M.D.   On: 08/24/2018 09:25   Dg Chest Port 1 View  Result Date: 08/24/2018 CLINICAL DATA:  Fever EXAM: PORTABLE CHEST 1 VIEW COMPARISON:  08/11/2018 FINDINGS: Heart and mediastinal contours are within normal limits. No focal opacities or effusions. No acute bony abnormality. IMPRESSION: No active disease. Electronically Signed   By: Rolm Baptise M.D.   On: 08/24/2018 03:13   Dg Chest Port 1 View  Result Date: 08/11/2018 CLINICAL DATA:  Fever. EXAM: PORTABLE CHEST 1 VIEW COMPARISON:  Radiographs of February 06, 2017. FINDINGS: The heart size and mediastinal contours are within normal limits. Both lungs are clear. The visualized skeletal structures are unremarkable. IMPRESSION: No active disease. Electronically Signed   By: Marijo Conception M.D.   On: 08/11/2018 16:25    Micro Results     Recent Results (from the past 240 hour(s))  SARS CORONAVIRUS 2 Nasal Swab Aptima Multi Swab     Status: Abnormal   Collection Time: 08/24/18  3:48 AM   Specimen: Aptima Multi Swab; Nasal Swab  Result Value Ref Range Status   SARS Coronavirus 2 POSITIVE (A) NEGATIVE Final    Comment: (NOTE) SARS-CoV-2 target nucleic acids are DETECTED. The SARS-CoV-2 RNA is generally detectable in upper and lower respiratory specimens during the acute phase of infection. Positive results are indicative of active infection with SARS-CoV-2. Clinical  correlation with patient history and other diagnostic information is necessary to determine patient infection status. Positive results do  not rule out bacterial infection or  co-infection with other viruses. The expected result is Negative. Fact Sheet for Patients: SugarRoll.be Fact Sheet for Healthcare Providers: https://www.woods-mathews.com/ This test is not yet approved or cleared by the [place] FDA and  has been authorized for detection and/or diagnosis of SARS-CoV-2 by FDA under an Emergency Use Authorization (EUA). This EUA will remain  in effect (meaning this test can be used) for the duration of the COVID-19 declaration under Section 564(b)(1) of the Act, 21 U.S.C.  section 360bbb-3(b)(1), unless the authorization is terminated or revoked sooner. Performed at [facility], [place] [address], [place], [place] [address]   SARS Coronavirus 2 [facility] order, Performed in [facility] hospital lab) Nasopharyngeal Nasopharyngeal Swab     Status: Abnormal   Collection Time: 08/24/18  6:35 AM   Specimen: Nasopharyngeal Swab  Result Value Ref Range Status   SARS Coronavirus 2 POSITIVE (A) NEGATIVE Final    Comment: RESULT CALLED TO, READ BACK BY AND VERIFIED WITH: Talbert Forest RN 10:20 08/24/18 ([email or web address]) (NOTE) If result is NEGATIVE SARS-CoV-2 target nucleic acids are NOT DETECTED. The SARS-CoV-2 RNA is generally detectable in upper and lower  respiratory specimens during the acute phase of infection. The lowest  concentration of SARS-CoV-2 viral copies this assay can detect is 250  copies / mL. A negative result does not preclude SARS-CoV-2 infection  and should not be used as the sole basis for treatment or other  patient management decisions.  A negative result may occur with  improper specimen collection / handling, submission of specimen other  than nasopharyngeal swab, presence of viral mutation(s) within the  areas targeted by this assay, and inadequate number of viral copies  (<250 copies / mL). A negative result must be combined with clinical  observations, patient history, and epidemiological  information. If result is POSITIVE SARS-CoV-2 target nucleic acids are DETECTED. The  SARS-CoV-2 RNA is generally detectable in upper and lower  respiratory specimens during the acute phase of infection.  Positive  results are indicative of active infection with SARS-CoV-2.  Clinical  correlation with patient history and other diagnostic information is  necessary to determine patient infection status.  Positive results do  not rule out bacterial infection or co-infection with other viruses. If result is PRESUMPTIVE POSTIVE SARS-CoV-2 nucleic acids MAY BE PRESENT.   A presumptive positive result was obtained on the submitted specimen  and confirmed on repeat testing.  While 2019 novel coronavirus  (SARS-CoV-2) nucleic acids may be present in the submitted sample  additional confirmatory testing may be necessary for epidemiological  and / or clinical management purposes  to differentiate between  SARS-CoV-2 and other Sarbecovirus currently known to infect humans.  If clinically indicated additional testing with an alternate test  methodology [phone]) is a dvised. The SARS-CoV-2 RNA is generally  detectable in upper and lower respiratory specimens during the acute  phase of infection. The expected result is Negative. Fact Sheet for Patients:  StrictlyIdeas.no Fact Sheet for Healthcare Providers: BankingDealers.co.za This test is not yet approved or cleared by the [place] FDA and has been authorized for detection and/or diagnosis of SARS-CoV-2 by FDA under an Emergency Use Authorization (EUA).  This EUA will remain in effect (meaning this test can be used) for the duration of the COVID-19 declaration under Section 564(b)(1) of the Act, 21 U.S.C. section 360bbb-3(b)(1), unless the authorization is terminated or revoked sooner. Performed at [facility], [place] [address], [place], [place] [address]     Today   Subjective     [name] today has no headache,no chest abdominal pain,no new weakness tingling or numbness, feels much better wants to go home today.     Objective   Blood pressure (!) 146/94, pulse 92, temperature 97.6 F (36.4 C), temperature source Oral, resp. rate 17, height 5\' 7"  (1.702 m), weight 95.3 kg, last menstrual period 08/11/2018, SpO2 100 %.   Intake/Output Summary (Last 24 hours) at 08/25/2018 0854 Last data filed at 08/25/2018 0818 Gross per 24 hour  Intake 2254.26 ml  Output 300 ml  Net 1954.26 ml    Exam  Awake Alert, Oriented x 3, No new F.N deficits, Normal affect [place].AT,PERRAL Supple Neck,No JVD, No cervical lymphadenopathy appriciated.  Symmetrical Chest wall movement, Good air movement bilaterally, CTAB RRR,No Gallops,Rubs or new Murmurs, No Parasternal Heave +ve B.Sounds, Abd Soft, Non tender, No organomegaly appriciated, No rebound -guarding or rigidity. No Cyanosis, Clubbing or edema, No new Rash or bruise   Data Review   CBC w Diff:  Lab Results  Component Value Date   WBC 8.2 08/25/2018   HGB 7.7 (L) 08/25/2018   HCT 24.8 (L) 08/25/2018   PLT 339 08/25/2018   LYMPHOPCT 27 08/25/2018   BANDSPCT 0 07/08/2010   MONOPCT 7 08/25/2018   EOSPCT 1 08/25/2018   BASOPCT 0 08/25/2018    CMP:  Lab Results  Component Value Date   NA 132 (L) 08/25/2018   K 4.4 08/25/2018   CL 106 08/25/2018   CO2 21 (L) 08/25/2018   BUN 35 (H) 08/25/2018   CREATININE 2.38 (H) 08/25/2018   CREATININE 1.24 (H) 06/17/2011   PROT 5.9 (L) 08/25/2018   ALBUMIN 2.2 (L) 08/25/2018   BILITOT 0.3 08/25/2018   ALKPHOS 62 08/25/2018   AST 10 (L) 08/25/2018   ALT 11 08/25/2018  .  COVID-19 Labs  Recent Labs    08/24/18 1440 08/25/18 0340  DDIMER 1.06* 1.54*  FERRITIN 78 70  LDH  --  124  CRP  --  1.0*    Lab Results  Component Value Date   SARSCOV2NAA POSITIVE (A) 08/24/2018   SARSCOV2NAA POSITIVE (A) 08/24/2018    Lab Results  Component Value Date   HGBA1C 12.5 (H)  08/24/2018    Total Time in preparing paper work, data evaluation and todays exam - 36 minutes  Lala Lund M.D on 08/25/2018 at 8:54 AM  [facility]   Office  [phone]

## 2018-08-25 NOTE — Plan of Care (Signed)

## 2018-09-04 LAB — URINE DRUGS OF ABUSE SCREEN W ALC, ROUTINE (REF LAB)
Amphetamines, Urine: NEGATIVE ng/mL
Barbiturate, Ur: NEGATIVE ng/mL
Benzodiazepine Quant, Ur: NEGATIVE ng/mL
Cocaine (Metab.): NEGATIVE ng/mL
Ethanol U, Quan: NEGATIVE %
Methadone Screen, Urine: NEGATIVE ng/mL
Phencyclidine, Ur: NEGATIVE ng/mL
Propoxyphene, Urine: NEGATIVE ng/mL

## 2018-09-04 LAB — OPIATES CONFIRMATION, URINE
CODEINE: NEGATIVE
MORPHINE: POSITIVE — AB
Morphine GC/MS Conf: 1101 ng/mL
OPIATES: POSITIVE — AB

## 2018-09-04 LAB — PANEL 799049
CARBOXY THC GC/MS CONF: 263 ng/mL
Cannabinoid GC/MS, Ur: POSITIVE — AB

## 2019-08-02 ENCOUNTER — Emergency Department (HOSPITAL_COMMUNITY): Payer: Medicaid Other

## 2019-08-02 ENCOUNTER — Emergency Department (HOSPITAL_COMMUNITY)
Admission: EM | Admit: 2019-08-02 | Discharge: 2019-08-02 | Disposition: A | Discharge status: Home / Self Care | Payer: Medicaid Other | Attending: Emergency Medicine | Admitting: Emergency Medicine

## 2019-08-02 ENCOUNTER — Other Ambulatory Visit: Payer: Self-pay

## 2019-08-02 ENCOUNTER — Encounter (HOSPITAL_COMMUNITY): Payer: Self-pay | Admitting: Emergency Medicine

## 2019-08-02 ENCOUNTER — Emergency Department (HOSPITAL_COMMUNITY)
Admission: EM | Admit: 2019-08-02 | Discharge: 2019-08-02 | Disposition: A | Discharge status: Home / Self Care | Payer: Self-pay | Attending: Emergency Medicine | Admitting: Emergency Medicine

## 2019-08-02 ENCOUNTER — Encounter (HOSPITAL_COMMUNITY): Payer: Self-pay

## 2019-08-02 DIAGNOSIS — R1110 Vomiting, unspecified: Secondary | ICD-10-CM

## 2019-08-02 DIAGNOSIS — J45909 Unspecified asthma, uncomplicated: Secondary | ICD-10-CM | POA: Diagnosis not present

## 2019-08-02 DIAGNOSIS — R112 Nausea with vomiting, unspecified: Secondary | ICD-10-CM | POA: Diagnosis not present

## 2019-08-02 DIAGNOSIS — I10 Essential (primary) hypertension: Secondary | ICD-10-CM | POA: Insufficient documentation

## 2019-08-02 DIAGNOSIS — E119 Type 2 diabetes mellitus without complications: Secondary | ICD-10-CM | POA: Diagnosis not present

## 2019-08-02 DIAGNOSIS — R109 Unspecified abdominal pain: Secondary | ICD-10-CM | POA: Diagnosis present

## 2019-08-02 DIAGNOSIS — Z5321 Procedure and treatment not carried out due to patient leaving prior to being seen by health care provider: Secondary | ICD-10-CM | POA: Insufficient documentation

## 2019-08-02 DIAGNOSIS — R42 Dizziness and giddiness: Secondary | ICD-10-CM | POA: Insufficient documentation

## 2019-08-02 LAB — URINALYSIS, ROUTINE W REFLEX MICROSCOPIC
Bilirubin Urine: NEGATIVE
Glucose, UA: 150 mg/dL — AB
Hgb urine dipstick: NEGATIVE
Ketones, ur: 5 mg/dL — AB
Nitrite: NEGATIVE
Protein, ur: >=300 mg/dL — AB
Specific Gravity, Urine: 1.018 (ref 1.005–1.030)
pH: 6 (ref 5.0–8.0)

## 2019-08-02 LAB — COMPREHENSIVE METABOLIC PANEL
ALT: 13 U/L (ref 0–44)
AST: 16 U/L (ref 15–41)
Albumin: 3.2 g/dL — ABNORMAL LOW (ref 3.5–5.0)
Alkaline Phosphatase: 74 U/L (ref 38–126)
Anion gap: 11 (ref 5–15)
BUN: 24 mg/dL — ABNORMAL HIGH (ref 6–20)
CO2: 16 mmol/L — ABNORMAL LOW (ref 22–32)
Calcium: 9.3 mg/dL (ref 8.9–10.3)
Chloride: 108 mmol/L (ref 98–111)
Creatinine, Ser: 2.7 mg/dL — ABNORMAL HIGH (ref 0.44–1.00)
GFR calc Af Amer: 24 mL/min — ABNORMAL LOW (ref >60)
GFR calc non Af Amer: 21 mL/min — ABNORMAL LOW (ref >60)
Glucose, Bld: 176 mg/dL — ABNORMAL HIGH (ref 70–99)
Potassium: 4.9 mmol/L (ref 3.5–5.1)
Sodium: 135 mmol/L (ref 135–145)
Total Bilirubin: 0.8 mg/dL (ref 0.3–1.2)
Total Protein: 8 g/dL (ref 6.5–8.1)

## 2019-08-02 LAB — CBC
HCT: 31.7 % — ABNORMAL LOW (ref 36.0–46.0)
Hemoglobin: 9.7 g/dL — ABNORMAL LOW (ref 12.0–15.0)
MCH: 23.4 pg — ABNORMAL LOW (ref 26.0–34.0)
MCHC: 30.6 g/dL (ref 30.0–36.0)
MCV: 76.6 fL — ABNORMAL LOW (ref 80.0–100.0)
Platelets: 373 10*3/uL (ref 150–400)
RBC: 4.14 MIL/uL (ref 3.87–5.11)
RDW: 16.9 % — ABNORMAL HIGH (ref 11.5–15.5)
WBC: 7.5 10*3/uL (ref 4.0–10.5)
nRBC: 0 % (ref 0.0–0.2)

## 2019-08-02 LAB — I-STAT BETA HCG BLOOD, ED (MC, WL, AP ONLY): I-stat hCG, quantitative: <5 m[IU]/mL (ref <5)

## 2019-08-02 LAB — LIPASE, BLOOD: Lipase: 32 U/L (ref 11–51)

## 2019-08-02 LAB — CBG MONITORING, ED: Glucose-Capillary: 127 mg/dL — ABNORMAL HIGH (ref 70–99)

## 2019-08-02 MED ORDER — LACTATED RINGERS IV BOLUS
1000.0000 mL | Freq: Once | INTRAVENOUS | Status: AC
  Administered 2019-08-02: 1000 mL via INTRAVENOUS

## 2019-08-02 MED ORDER — FENTANYL CITRATE (PF) 100 MCG/2ML IJ SOLN
50.0000 ug | Freq: Once | INTRAMUSCULAR | Status: AC
  Administered 2019-08-02: 50 ug via INTRAVENOUS
  Filled 2019-08-02: qty 2

## 2019-08-02 MED ORDER — ONDANSETRON 4 MG PO TBDP
4.0000 mg | ORAL_TABLET | Freq: Three times a day (TID) | ORAL | 0 refills | Status: DC | PRN
Start: 2019-08-02 — End: 2020-01-31

## 2019-08-02 MED ORDER — SODIUM CHLORIDE 0.9% FLUSH: 3.0000 mL | Freq: Once | INTRAVENOUS | Status: DC

## 2019-08-02 MED ORDER — SODIUM CHLORIDE 0.9 % IV BOLUS
1000.0000 mL | Freq: Once | INTRAVENOUS | Status: AC
  Administered 2019-08-02: 1000 mL via INTRAVENOUS

## 2019-08-02 MED ORDER — ONDANSETRON HCL 4 MG/2ML IJ SOLN
4.0000 mg | Freq: Once | INTRAMUSCULAR | Status: AC
  Administered 2019-08-02: 4 mg via INTRAVENOUS
  Filled 2019-08-02: qty 2

## 2019-08-02 NOTE — ED Triage Notes (Signed)
Pt presents via POV c/o N/V since today. Seen here earlier and d/c per pt report. Pt reports dizziness with vomiting. Pt A&Ox4.

## 2019-08-02 NOTE — Discharge Instructions (Addendum)
You were evaluated in the Emergency Department and after careful evaluation, we did not find any emergent condition requiring admission or further testing in the hospital.  Your symptoms may be due to a virus causing discomfort and vomiting.  Your labs show that you are dehydrated.  We are glad you are feeling better, please use the Zofran medication at home to help you drink water and rehydrate yourself.  Please return to the Emergency Department if you experience any worsening of your condition.  Thank you for allowing [place] to be a part of your care.

## 2019-08-02 NOTE — ED Provider Notes (Signed)
[facility] Emergency Department Provider Note MRN:  [id number]  Arrival date & time: 08/02/19     Chief Complaint   Abdominal Pain   History of Present Illness   [name] is a 39 y.o. year-old female with a history of depression, diabetes, hypertension presenting to the ED with chief complaint of abdominal pain.  Patient is endorsing persistent nausea and vomiting over the past 24 hours.  Mild diffuse abdominal pain.  Normal bowel movements.  Denies fever, no cough, no burning with urination.  No blood in the vomit or stool.  Symptoms are constant, nausea is moderate to severe, no other exacerbating relieving factors.  Review of Systems  A complete 10 system review of systems was obtained and all systems are negative except as noted in the HPI and PMH.   Patient's Health History    Past Medical History:  Diagnosis Date  . Anxiety   . Asthma   . Depression   . Diabetes mellitus   . Hyperemesis arising during pregnancy   . Hypertension   . Mental disorder     Past Surgical History:  Procedure Laterality Date  . NO PAST SURGERIES    . TUBAL LIGATION  06/20/2011   Procedure: POST PARTUM TUBAL LIGATION;  Surgeon: Mora Bellman, MD;  Location: [facility];  Service: Gynecology;  Laterality: Bilateral;  Bilateral post partum tubal ligation    Family History  Problem Relation Age of Onset  . Heart disease Mother        CHF  . Diabetes Sister   . Anesthesia problems Neg Hx     Social History   Socioeconomic History  . Marital status: Single    Spouse name: Not on file  . Number of children: Not on file  . Years of education: Not on file  . Highest education level: Not on file  Occupational History  . Not on file  Tobacco Use  . Smoking status: Never Smoker  . Smokeless tobacco: Never Used  Substance and Sexual Activity  . Alcohol use: No    Comment: occassionally  . Drug use: No    Types: Cocaine, Marijuana  . Sexual activity: Yes    Birth  control/protection: None  Other Topics Concern  . Not on file  Social History Narrative  . Not on file   Social Determinants of Health   Financial Resource Strain:   . Difficulty of Paying Living Expenses:   Food Insecurity:   . Worried About Charity fundraiser in the Last Year:   . Arboriculturist in the Last Year:   Transportation Needs:   . Film/video editor (Medical):   Marland Kitchen Lack of Transportation (Non-Medical):   Physical Activity:   . Days of Exercise per Week:   . Minutes of Exercise per Session:   Stress:   . Feeling of Stress :   Social Connections:   . Frequency of Communication with Friends and Family:   . Frequency of Social Gatherings with Friends and Family:   . Attends Religious Services:   . Active Member of Clubs or Organizations:   . Attends Archivist Meetings:   Marland Kitchen Marital Status:   Intimate Partner Violence:   . Fear of Current or Ex-Partner:   . Emotionally Abused:   Marland Kitchen Physically Abused:   . Sexually Abused:      Physical Exam   Vitals:   08/02/19 1206 08/02/19 1336  BP:  (!) 175/95  Pulse: 92 93  Resp:  18  Temp:  98.3 F (36.8 C)  SpO2: 95% 98%    CONSTITUTIONAL: Well-appearing, NAD NEURO:  Alert and oriented x 3, no focal deficits EYES:  eyes equal and reactive ENT/NECK:  no LAD, no JVD CARDIO: Regular rate, well-perfused, normal S1 and S2 PULM:  CTAB no wheezing or rhonchi GI/GU:  normal bowel sounds, non-distended, non-tender MSK/SPINE:  No gross deformities, no edema SKIN:  no rash, atraumatic PSYCH:  Appropriate speech and behavior  *Additional and/or pertinent findings included in MDM below  Diagnostic and Interventional Summary    EKG Interpretation  Date/Time:  Thursday August 02 2019 11:56:58 EDT Ventricular Rate:  67 PR Interval:    QRS Duration: 90 QT Interval:  373 QTC Calculation: 394 R Axis:   70 Text Interpretation: Sinus rhythm Confirmed by Gerlene Fee [phone]) on 08/02/2019 12:08:02 PM       Labs Reviewed  COMPREHENSIVE METABOLIC PANEL - Abnormal; Notable for the following components:      Result Value   CO2 16 (*)    Glucose, Bld 176 (*)    BUN 24 (*)    Creatinine, Ser 2.70 (*)    Albumin 3.2 (*)    GFR calc non Af Amer 21 (*)    GFR calc Af Amer 24 (*)    All other components within normal limits  CBC - Abnormal; Notable for the following components:   Hemoglobin 9.7 (*)    HCT 31.7 (*)    MCV 76.6 (*)    MCH 23.4 (*)    RDW 16.9 (*)    All other components within normal limits  URINALYSIS, ROUTINE W REFLEX MICROSCOPIC - Abnormal; Notable for the following components:   APPearance HAZY (*)    Glucose, UA 150 (*)    Ketones, ur 5 (*)    Protein, ur >=300 (*)    Leukocytes,Ua MODERATE (*)    Bacteria, UA RARE (*)    All other components within normal limits  CBG MONITORING, ED - Abnormal; Notable for the following components:   Glucose-Capillary 127 (*)    All other components within normal limits  LIPASE, BLOOD  I-STAT BETA HCG BLOOD, ED (MC, WL, AP ONLY)    DG ABD ACUTE 2+V W 1V CHEST  Final Result      Medications  sodium chloride flush (NS) 0.9 % injection 3 mL (3 mLs Intravenous Not Given 08/02/19 1113)  sodium chloride 0.9 % bolus 1,000 mL (0 mLs Intravenous Stopped 08/02/19 1320)  ondansetron (ZOFRAN) injection 4 mg (4 mg Intravenous Given 08/02/19 1149)  lactated ringers bolus 1,000 mL (0 mLs Intravenous Stopped 08/02/19 1320)  fentaNYL (SUBLIMAZE) injection 50 mcg (50 mcg Intravenous Given 08/02/19 1252)     Procedures  /  Critical Care Procedures  ED Course and Medical Decision Making  I have reviewed the triage vital signs, the nursing notes, and pertinent available records from the EMR.  Listed above are laboratory and imaging tests that I personally ordered, reviewed, and interpreted and then considered in my medical decision making (see below for details).      Benign abdomen, persistent emesis, normal neurological exam, unclear  etiology at this time, screening labs and abdominal x-ray pending.  Work-up reveals fairly significant acidosis, however glucose is normal and anion gap is normal so inconsistent with DKA.  Favoring dehydration as the driving source of her acidosis.  Feeling much better after 2 L of fluid and nausea medicine.  Patient is requesting discharge.  Abdomen continues  to be soft and nontender, nothing to suggest acute surgical emergency.  Possibly viral gastritis, strict return precautions.  Barth Kirks. Sedonia Small, [place] [email or web address]  Final Clinical Impressions(s) / ED Diagnoses     ICD-10-CM   1. Emesis  R11.10 DG ABD ACUTE 2+V W 1V CHEST    DG ABD ACUTE 2+V W 1V CHEST    ED Discharge Orders         Ordered    ondansetron (ZOFRAN ODT) 4 MG disintegrating tablet  Every 8 hours PRN     Discontinue  Reprint     08/02/19 1436           Discharge Instructions Discussed with and Provided to Patient:     Discharge Instructions     You were evaluated in the Emergency Department and after careful evaluation, we did not find any emergent condition requiring admission or further testing in the hospital.  Your symptoms may be due to a virus causing discomfort and vomiting.  Your labs show that you are dehydrated.  We are glad you are feeling better, please use the Zofran medication at home to help you drink water and rehydrate yourself.  Please return to the Emergency Department if you experience any worsening of your condition.  Thank you for allowing [place] to be a part of your care.        Maudie Flakes, MD 08/02/19 1438

## 2019-08-02 NOTE — ED Triage Notes (Signed)
C/o generalized abd pain, nausea, vomiting, and headache x 3 days.  Denies diarrhea.

## 2019-08-02 NOTE — ED Notes (Signed)
After pt vomited on trash can and on the floor, she stated she was leaving

## 2019-08-03 ENCOUNTER — Encounter: Payer: Self-pay | Admitting: Emergency Medicine

## 2019-08-03 ENCOUNTER — Ambulatory Visit
Admission: EM | Admit: 2019-08-03 | Discharge: 2019-08-03 | Disposition: A | Discharge status: Home / Self Care | Payer: Self-pay | Attending: Emergency Medicine | Admitting: Emergency Medicine

## 2019-08-03 ENCOUNTER — Emergency Department (HOSPITAL_COMMUNITY): Payer: Medicaid Other

## 2019-08-03 ENCOUNTER — Inpatient Hospital Stay (HOSPITAL_COMMUNITY)
Admission: EM | Admit: 2019-08-03 | Discharge: 2019-08-08 | DRG: 392 | Disposition: A | Discharge status: Home / Self Care | Payer: Medicaid Other | Attending: Internal Medicine | Admitting: Internal Medicine

## 2019-08-03 ENCOUNTER — Encounter (HOSPITAL_COMMUNITY): Payer: Self-pay | Admitting: Emergency Medicine

## 2019-08-03 DIAGNOSIS — F32A Depression, unspecified: Secondary | ICD-10-CM | POA: Diagnosis present

## 2019-08-03 DIAGNOSIS — I129 Hypertensive chronic kidney disease with stage 1 through stage 4 chronic kidney disease, or unspecified chronic kidney disease: Secondary | ICD-10-CM | POA: Diagnosis present

## 2019-08-03 DIAGNOSIS — E872 Acidosis: Secondary | ICD-10-CM | POA: Diagnosis not present

## 2019-08-03 DIAGNOSIS — Z833 Family history of diabetes mellitus: Secondary | ICD-10-CM

## 2019-08-03 DIAGNOSIS — J45909 Unspecified asthma, uncomplicated: Secondary | ICD-10-CM | POA: Diagnosis present

## 2019-08-03 DIAGNOSIS — Z8249 Family history of ischemic heart disease and other diseases of the circulatory system: Secondary | ICD-10-CM

## 2019-08-03 DIAGNOSIS — Z79899 Other long term (current) drug therapy: Secondary | ICD-10-CM

## 2019-08-03 DIAGNOSIS — F1290 Cannabis use, unspecified, uncomplicated: Secondary | ICD-10-CM | POA: Diagnosis present

## 2019-08-03 DIAGNOSIS — E119 Type 2 diabetes mellitus without complications: Secondary | ICD-10-CM

## 2019-08-03 DIAGNOSIS — E860 Dehydration: Secondary | ICD-10-CM | POA: Diagnosis present

## 2019-08-03 DIAGNOSIS — R112 Nausea with vomiting, unspecified: Secondary | ICD-10-CM

## 2019-08-03 DIAGNOSIS — K2100 Gastro-esophageal reflux disease with esophagitis, without bleeding: Principal | ICD-10-CM | POA: Diagnosis present

## 2019-08-03 DIAGNOSIS — R935 Abnormal findings on diagnostic imaging of other abdominal regions, including retroperitoneum: Secondary | ICD-10-CM

## 2019-08-03 DIAGNOSIS — Z79891 Long term (current) use of opiate analgesic: Secondary | ICD-10-CM

## 2019-08-03 DIAGNOSIS — N184 Chronic kidney disease, stage 4 (severe): Secondary | ICD-10-CM | POA: Diagnosis present

## 2019-08-03 DIAGNOSIS — D509 Iron deficiency anemia, unspecified: Secondary | ICD-10-CM | POA: Diagnosis present

## 2019-08-03 DIAGNOSIS — F329 Major depressive disorder, single episode, unspecified: Secondary | ICD-10-CM | POA: Diagnosis present

## 2019-08-03 DIAGNOSIS — E1165 Type 2 diabetes mellitus with hyperglycemia: Secondary | ICD-10-CM | POA: Diagnosis present

## 2019-08-03 DIAGNOSIS — Z20822 Contact with and (suspected) exposure to covid-19: Secondary | ICD-10-CM | POA: Diagnosis present

## 2019-08-03 DIAGNOSIS — E876 Hypokalemia: Secondary | ICD-10-CM | POA: Diagnosis not present

## 2019-08-03 DIAGNOSIS — K209 Esophagitis, unspecified without bleeding: Secondary | ICD-10-CM

## 2019-08-03 DIAGNOSIS — E1122 Type 2 diabetes mellitus with diabetic chronic kidney disease: Secondary | ICD-10-CM | POA: Diagnosis present

## 2019-08-03 DIAGNOSIS — N179 Acute kidney failure, unspecified: Secondary | ICD-10-CM | POA: Diagnosis present

## 2019-08-03 DIAGNOSIS — Z794 Long term (current) use of insulin: Secondary | ICD-10-CM

## 2019-08-03 DIAGNOSIS — K449 Diaphragmatic hernia without obstruction or gangrene: Secondary | ICD-10-CM | POA: Diagnosis present

## 2019-08-03 DIAGNOSIS — I160 Hypertensive urgency: Secondary | ICD-10-CM

## 2019-08-03 LAB — CBC WITH DIFFERENTIAL/PLATELET
Abs Immature Granulocytes: 0.06 10*3/uL (ref 0.00–0.07)
Basophils Absolute: 0 10*3/uL (ref 0.0–0.1)
Basophils Relative: 0 %
Eosinophils Absolute: 0 10*3/uL (ref 0.0–0.5)
Eosinophils Relative: 0 %
HCT: 30.8 % — ABNORMAL LOW (ref 36.0–46.0)
Hemoglobin: 9 g/dL — ABNORMAL LOW (ref 12.0–15.0)
Immature Granulocytes: 0 %
Lymphocytes Relative: 7 %
Lymphs Abs: 1 10*3/uL (ref 0.7–4.0)
MCH: 22.8 pg — ABNORMAL LOW (ref 26.0–34.0)
MCHC: 29.2 g/dL — ABNORMAL LOW (ref 30.0–36.0)
MCV: 78 fL — ABNORMAL LOW (ref 80.0–100.0)
Monocytes Absolute: 0.4 10*3/uL (ref 0.1–1.0)
Monocytes Relative: 3 %
Neutro Abs: 12.9 10*3/uL — ABNORMAL HIGH (ref 1.7–7.7)
Neutrophils Relative %: 90 %
Platelets: 391 10*3/uL (ref 150–400)
RBC: 3.95 MIL/uL (ref 3.87–5.11)
RDW: 16.8 % — ABNORMAL HIGH (ref 11.5–15.5)
WBC: 14.3 10*3/uL — ABNORMAL HIGH (ref 4.0–10.5)
nRBC: 0 % (ref 0.0–0.2)

## 2019-08-03 LAB — COMPREHENSIVE METABOLIC PANEL
ALT: 17 U/L (ref 0–44)
AST: 26 U/L (ref 15–41)
Albumin: 3.2 g/dL — ABNORMAL LOW (ref 3.5–5.0)
Alkaline Phosphatase: 69 U/L (ref 38–126)
Anion gap: 18 — ABNORMAL HIGH (ref 5–15)
BUN: 32 mg/dL — ABNORMAL HIGH (ref 6–20)
CO2: 13 mmol/L — ABNORMAL LOW (ref 22–32)
Calcium: 9.2 mg/dL (ref 8.9–10.3)
Chloride: 105 mmol/L (ref 98–111)
Creatinine, Ser: 3.18 mg/dL — ABNORMAL HIGH (ref 0.44–1.00)
GFR calc Af Amer: 20 mL/min — ABNORMAL LOW (ref >60)
GFR calc non Af Amer: 17 mL/min — ABNORMAL LOW (ref >60)
Glucose, Bld: 219 mg/dL — ABNORMAL HIGH (ref 70–99)
Potassium: 4.8 mmol/L (ref 3.5–5.1)
Sodium: 136 mmol/L (ref 135–145)
Total Bilirubin: 0.6 mg/dL (ref 0.3–1.2)
Total Protein: 7.9 g/dL (ref 6.5–8.1)

## 2019-08-03 LAB — LACTIC ACID, PLASMA
Lactic Acid, Venous: 1.3 mmol/L (ref 0.5–1.9)
Lactic Acid, Venous: 1 mmol/L (ref 0.5–1.9)

## 2019-08-03 LAB — URINALYSIS, ROUTINE W REFLEX MICROSCOPIC
Bilirubin Urine: NEGATIVE
Glucose, UA: 150 mg/dL — AB
Ketones, ur: 20 mg/dL — AB
Leukocytes,Ua: NEGATIVE
Nitrite: NEGATIVE
Protein, ur: >=300 mg/dL — AB
Specific Gravity, Urine: 1.019 (ref 1.005–1.030)
pH: 6 (ref 5.0–8.0)

## 2019-08-03 LAB — HIV ANTIBODY (ROUTINE TESTING W REFLEX): HIV Screen 4th Generation wRfx: NONREACTIVE

## 2019-08-03 LAB — LIPASE, BLOOD: Lipase: 27 U/L (ref 11–51)

## 2019-08-03 LAB — GLUCOSE, CAPILLARY: Glucose-Capillary: 160 mg/dL — ABNORMAL HIGH (ref 70–99)

## 2019-08-03 LAB — HEMOGLOBIN A1C
Hgb A1c MFr Bld: 7.9 % — ABNORMAL HIGH (ref 4.8–5.6)
Mean Plasma Glucose: 180.03 mg/dL

## 2019-08-03 LAB — SARS CORONAVIRUS 2 BY RT PCR (HOSPITAL ORDER, PERFORMED IN ~~LOC~~ HOSPITAL LAB): SARS Coronavirus 2: NEGATIVE

## 2019-08-03 MED ORDER — ONDANSETRON HCL 4 MG/2ML IJ SOLN
4.0000 mg | Freq: Four times a day (QID) | INTRAMUSCULAR | Status: DC | PRN
  Administered 2019-08-03 – 2019-08-05 (×7): 4 mg via INTRAVENOUS
  Filled 2019-08-03 (×8): qty 2

## 2019-08-03 MED ORDER — ALUM & MAG HYDROXIDE-SIMETH 200-200-20 MG/5ML PO SUSP
30.0000 mL | Freq: Once | ORAL | Status: AC
  Administered 2019-08-05: 30 mL via ORAL
  Filled 2019-08-03 (×2): qty 30

## 2019-08-03 MED ORDER — SODIUM CHLORIDE 0.9 % IV BOLUS
1000.0000 mL | Freq: Once | INTRAVENOUS | Status: AC
  Administered 2019-08-03: 1000 mL via INTRAVENOUS

## 2019-08-03 MED ORDER — METOPROLOL TARTRATE 5 MG/5ML IV SOLN
5.0000 mg | Freq: Four times a day (QID) | INTRAVENOUS | Status: DC | PRN
  Administered 2019-08-03 – 2019-08-06 (×6): 5 mg via INTRAVENOUS
  Filled 2019-08-03 (×6): qty 5

## 2019-08-03 MED ORDER — METRONIDAZOLE IVPB CUSTOM
2000.0000 mg | Freq: Once | INTRAVENOUS | Status: DC
  Filled 2019-08-03 (×2): qty 400

## 2019-08-03 MED ORDER — PANTOPRAZOLE SODIUM 40 MG PO TBEC
40.0000 mg | DELAYED_RELEASE_TABLET | Freq: Every day | ORAL | 0 refills | Status: DC
Start: 2019-08-03 — End: 2019-08-08

## 2019-08-03 MED ORDER — ONDANSETRON HCL 4 MG/2ML IJ SOLN
4.0000 mg | Freq: Once | INTRAMUSCULAR | Status: AC
  Administered 2019-08-03: 4 mg via INTRAVENOUS
  Filled 2019-08-03: qty 2

## 2019-08-03 MED ORDER — HALOPERIDOL LACTATE 5 MG/ML IJ SOLN
2.0000 mg | Freq: Once | INTRAMUSCULAR | Status: AC
  Administered 2019-08-03: 2 mg via INTRAVENOUS
  Filled 2019-08-03: qty 1

## 2019-08-03 MED ORDER — FAMOTIDINE IN NACL 20-0.9 MG/50ML-% IV SOLN
20.0000 mg | Freq: Every day | INTRAVENOUS | Status: DC
  Administered 2019-08-03 – 2019-08-04 (×2): 20 mg via INTRAVENOUS
  Filled 2019-08-03 (×3): qty 50

## 2019-08-03 MED ORDER — ENOXAPARIN SODIUM 30 MG/0.3ML ~~LOC~~ SOLN
30.0000 mg | SUBCUTANEOUS | Status: DC
  Administered 2019-08-03 – 2019-08-04 (×2): 30 mg via SUBCUTANEOUS
  Filled 2019-08-03 (×2): qty 0.3

## 2019-08-03 MED ORDER — LORAZEPAM 2 MG/ML IJ SOLN
1.0000 mg | Freq: Once | INTRAMUSCULAR | Status: AC
  Administered 2019-08-03: 1 mg via INTRAVENOUS
  Filled 2019-08-03: qty 1

## 2019-08-03 MED ORDER — INSULIN ASPART 100 UNIT/ML ~~LOC~~ SOLN
0.0000 [IU] | SUBCUTANEOUS | Status: DC
  Administered 2019-08-03: 2 [IU] via SUBCUTANEOUS
  Administered 2019-08-04: 3 [IU] via SUBCUTANEOUS
  Administered 2019-08-04 (×2): 5 [IU] via SUBCUTANEOUS
  Administered 2019-08-05: 3 [IU] via SUBCUTANEOUS
  Administered 2019-08-05: 5 [IU] via SUBCUTANEOUS
  Administered 2019-08-05: 2 [IU] via SUBCUTANEOUS
  Administered 2019-08-05 (×2): 3 [IU] via SUBCUTANEOUS
  Administered 2019-08-05 – 2019-08-06 (×2): 2 [IU] via SUBCUTANEOUS
  Administered 2019-08-06: 5 [IU] via SUBCUTANEOUS
  Administered 2019-08-06 – 2019-08-07 (×2): 2 [IU] via SUBCUTANEOUS

## 2019-08-03 MED ORDER — ALBUTEROL SULFATE (2.5 MG/3ML) 0.083% IN NEBU: 2.5000 mg | INHALATION_SOLUTION | RESPIRATORY_TRACT | Status: DC | PRN

## 2019-08-03 MED ORDER — ACETAMINOPHEN 325 MG PO TABS
650.0000 mg | ORAL_TABLET | Freq: Once | ORAL | Status: AC
  Administered 2019-08-03: 650 mg via ORAL
  Filled 2019-08-03: qty 2

## 2019-08-03 MED ORDER — FAMOTIDINE IN NACL 20-0.9 MG/50ML-% IV SOLN
20.0000 mg | Freq: Once | INTRAVENOUS | Status: AC
  Administered 2019-08-03: 20 mg via INTRAVENOUS
  Filled 2019-08-03: qty 50

## 2019-08-03 MED ORDER — ACETAMINOPHEN 650 MG RE SUPP: 650.0000 mg | Freq: Four times a day (QID) | RECTAL | Status: DC | PRN

## 2019-08-03 MED ORDER — PANTOPRAZOLE SODIUM 40 MG IV SOLR
40.0000 mg | Freq: Once | INTRAVENOUS | Status: AC
  Administered 2019-08-03: 40 mg via INTRAVENOUS
  Filled 2019-08-03: qty 40

## 2019-08-03 MED ORDER — HYDRALAZINE HCL 50 MG PO TABS
50.0000 mg | ORAL_TABLET | Freq: Three times a day (TID) | ORAL | Status: DC
  Administered 2019-08-03: 50 mg via ORAL
  Filled 2019-08-03: qty 1

## 2019-08-03 MED ORDER — LIDOCAINE VISCOUS HCL 2 % MT SOLN
15.0000 mL | Freq: Once | OROMUCOSAL | Status: AC
  Administered 2019-08-03: 15 mL via ORAL

## 2019-08-03 MED ORDER — ALUM & MAG HYDROXIDE-SIMETH 200-200-20 MG/5ML PO SUSP
30.0000 mL | Freq: Once | ORAL | Status: AC
  Administered 2019-08-03: 30 mL via ORAL

## 2019-08-03 MED ORDER — AMLODIPINE BESYLATE 10 MG PO TABS
10.0000 mg | ORAL_TABLET | Freq: Every day | ORAL | Status: DC
  Administered 2019-08-03 – 2019-08-08 (×4): 10 mg via ORAL
  Filled 2019-08-03 (×3): qty 1
  Filled 2019-08-03: qty 2
  Filled 2019-08-03: qty 1

## 2019-08-03 MED ORDER — ACETAMINOPHEN 325 MG PO TABS
650.0000 mg | ORAL_TABLET | Freq: Four times a day (QID) | ORAL | Status: DC | PRN
  Filled 2019-08-03 (×2): qty 2

## 2019-08-03 MED ORDER — SODIUM CHLORIDE 0.9 % IV SOLN: INTRAVENOUS | Status: DC

## 2019-08-03 MED ORDER — ONDANSETRON HCL 4 MG PO TABS: 4.0000 mg | ORAL_TABLET | Freq: Four times a day (QID) | ORAL | Status: DC | PRN

## 2019-08-03 MED ORDER — FAMOTIDINE IN NACL 20-0.9 MG/50ML-% IV SOLN: 20.0000 mg | Freq: Two times a day (BID) | INTRAVENOUS | Status: DC

## 2019-08-03 MED ORDER — ONDANSETRON 4 MG PO TBDP
4.0000 mg | ORAL_TABLET | Freq: Once | ORAL | Status: AC
  Administered 2019-08-03: 4 mg via ORAL

## 2019-08-03 NOTE — Progress Notes (Signed)
Responded to PIV consult. IV obtained by MD on arrival to room.

## 2019-08-03 NOTE — ED Notes (Signed)
Pt aware urine sample is needed 

## 2019-08-03 NOTE — ED Notes (Signed)
Pt had labs done here yesterday

## 2019-08-03 NOTE — ED Triage Notes (Signed)
Pt c/o nausea/vomiting since yesterday, and now heartburn starting today. Pt has not taken anything for either complaint. Pt seen in ER yesterday for same, states she "cannot keep that medicine down" (zofran)

## 2019-08-03 NOTE — ED Triage Notes (Signed)
Pt arrives via ptar from home with c/o "pain all over" and nausea. Pt seen inducing vomiting by ems. Pt seen here yesterday for the same.

## 2019-08-03 NOTE — ED Notes (Signed)
Report given to 5n

## 2019-08-03 NOTE — Plan of Care (Signed)

## 2019-08-03 NOTE — ED Provider Notes (Signed)
[facility] EMERGENCY DEPARTMENT Provider Note   CSN: [id number] Arrival date & time: 08/03/19  1028     History Chief Complaint  Patient presents with   Abdominal Pain   Emesis    [name] is a 39 y.o. female.  HPI   39 year old female with a history of anxiety/depression, asthma, diabetes, hypertension, mental disorder, who presents to the emergency department today for evaluation of nausea and vomiting.  States symptoms started 2 days ago.  She denies any abdominal pain, diarrhea, constipation or fevers.  Denies any urinary symptoms.  Has been seen twice for similar symptoms over the past several days and has had reassuring work-up.  She does admit to marijuana use.  Denies frequent EtOH use or other ingestions.  Past Medical History:  Diagnosis Date   Anxiety    Asthma    Depression    Diabetes mellitus    Hyperemesis arising during pregnancy    Hypertension    Mental disorder     Patient Active Problem List   Diagnosis Date Noted   Intractable nausea and vomiting 08/03/2019   ARF (acute renal failure) ([place]) 08/24/2018   Uncontrolled type 2 diabetes mellitus with hyperglycemia ([place]) 08/24/2018   DKA (diabetic ketoacidoses) ([place]) 08/24/2018   Non-intractable vomiting    Sepsis ([place]) 02/15/2016   Generalized abdominal pain 02/15/2016   CAP (community acquired pneumonia) 02/14/2016   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 pylori infection 11/24/2010   Depression 11/20/2010   Asthma 11/20/2010   Chronic hypertension in pregnancy 11/08/2010   Type 2 diabetes mellitus ([place]) 11/07/2010    Past Surgical History:  Procedure Laterality Date   NO PAST SURGERIES     TUBAL LIGATION  06/20/2011   Procedure: POST PARTUM TUBAL LIGATION;  Surgeon: Mora Bellman, MD;  Location: [facility];  Service: Gynecology;  Laterality: Bilateral;  Bilateral post partum tubal ligation     OB History    Gravida  4    Para  3   Term  2   Preterm  1   AB  1   Living  3     SAB  0   TAB  0   Ectopic  1   Multiple  0   Live Births  3           Family History  Problem Relation Age of Onset   Heart disease Mother        CHF   Diabetes Sister    Anesthesia problems Neg Hx     Social History   Tobacco Use   Smoking status: Never Smoker   Smokeless tobacco: Never Used  Substance Use Topics   Alcohol use: No    Comment: occassionally   Drug use: No    Types: Cocaine, Marijuana    Home Medications Prior to Admission medications   Medication Sig Start Date End Date Taking? Authorizing Provider  acetaminophen (TYLENOL) 325 MG tablet Take 2 tablets (650 mg total) by mouth every 6 (six) hours as needed. 10/21/16  Yes Jacqlyn Larsen, PA-C  amLODipine (NORVASC) 10 MG tablet Take 1 tablet (10 mg total) by mouth daily. 08/25/18   Thurnell Lose, MD  benzonatate (TESSALON PERLES) 100 MG capsule Take 1 capsule (100 mg total) by mouth 3 (three) times daily as needed for cough. 02/06/16   Mackuen, Courteney Lyn, MD  ferrous sulfate 325 (65 FE) MG tablet Take 1 tablet (325 mg total) by  mouth 3 (three) times daily with meals. 02/18/16   Velvet Bathe, MD  hydrALAZINE (APRESOLINE) 50 MG tablet Take 1 tablet (50 mg total) by mouth 3 (three) times daily. 08/25/18 08/03/19  Thurnell Lose, MD  insulin aspart (NOVOLOG) 100 UNIT/ML injection Before each meal 3 times a day , 140-199 - 2 units, 200-250 - 4 units, 251-299 - 6 units,  300-349 - 8 units,  350 or above 10 units. Insulin PEN if approved, provide syringes and needles if needed. Can switch to any other cheaper alternative. Patient taking differently: Inject 2-10 Units into the skin See admin instructions. Before each meal 3 times a day , 140-199 - 2 units, 200-250 - 4 units, 251-299 - 6 units,  300-349 - 8 units,  350 or above 10 units. 08/25/18   Thurnell Lose, MD  insulin glargine (LANTUS) 100 UNIT/ML injection Inject 0.4 mLs (40 Units  total) into the skin at bedtime. Dispense insulin pen if approved, if not dispense as needed syringes and needles for 1 month supply. Can switch to Levemir. Diagnosis E 11.65. Patient taking differently: Inject 40 Units into the skin at bedtime.  08/25/18   Thurnell Lose, MD  Insulin Syringe-Needle U-100 25G X 1" 1 ML MISC For 4 times a day insulin SQ, 1 month supply. Diagnosis E11.65 08/25/18   Thurnell Lose, MD  metFORMIN (GLUCOPHAGE) 500 MG tablet Take 1 tablet (500 mg total) by mouth 2 (two) times daily with a meal. 08/11/18   Jacqlyn Larsen, PA-C  methocarbamol (ROBAXIN) 500 MG tablet Take 1 tablet (500 mg total) by mouth 2 (two) times daily. 10/21/16   Jacqlyn Larsen, PA-C  ondansetron (ZOFRAN ODT) 4 MG disintegrating tablet Take 1 tablet (4 mg total) by mouth every 8 (eight) hours as needed for nausea or vomiting. 08/02/19   Maudie Flakes, MD  pantoprazole (PROTONIX) 40 MG tablet Take 1 tablet (40 mg total) by mouth daily. 08/03/19   Hall-Potvin, [place], PA-C  promethazine-dextromethorphan (PROMETHAZINE-DM) 6.25-15 MG/5ML syrup Take 5 mLs by mouth 4 (four) times daily as needed for cough. 02/06/17   Tanna Furry, MD  traMADol (ULTRAM) 50 MG tablet Take 1 tablet (50 mg total) by mouth every 6 (six) hours as needed. 02/06/17   Tanna Furry, MD    Allergies    Shellfish allergy and Morphine and related  Review of Systems   Review of Systems  Constitutional: Negative for chills and fever.  HENT: Negative for ear pain and sore throat.   Eyes: Negative for pain and visual disturbance.  Respiratory: Negative for shortness of breath.   Cardiovascular: Negative for chest pain.  Gastrointestinal: Positive for nausea and vomiting. Negative for abdominal pain, constipation and diarrhea.  Genitourinary: Negative for dysuria and hematuria.  Musculoskeletal: Negative for back pain.  Skin: Positive for color change. Negative for rash.  Neurological: Negative for numbness.  Psychiatric/Behavioral:  The patient is nervous/anxious.   All other systems reviewed and are negative.   Physical Exam Updated Vital Signs BP (!) 183/88 (BP Location: Left Arm)    Pulse (!) 118    Temp 100 F (37.8 C) (Rectal)    Resp 16    LMP 07/05/2019 Comment: pt has tubal ligation   SpO2 99%   Physical Exam Vitals and nursing note reviewed.  Constitutional:      General: She is not in acute distress.    Appearance: She is well-developed.  HENT:     Head: Normocephalic and atraumatic.  Eyes:  Conjunctiva/sclera: Conjunctivae normal.  Cardiovascular:     Rate and Rhythm: Regular rhythm. Tachycardia present.     Heart sounds: No murmur heard.   Pulmonary:     Effort: Pulmonary effort is normal. No respiratory distress.     Breath sounds: Normal breath sounds.  Abdominal:     General: Bowel sounds are normal.     Palpations: Abdomen is soft.     Tenderness: There is abdominal tenderness in the epigastric area.     Comments: Actively vomiting  Musculoskeletal:     Cervical back: Neck supple.  Skin:    General: Skin is warm and dry.  Neurological:     Mental Status: She is alert.     ED Results / Procedures / Treatments   Labs (all labs ordered are listed, but only abnormal results are displayed) Labs Reviewed  CBC WITH DIFFERENTIAL/PLATELET - Abnormal; Notable for the following components:      Result Value   WBC 14.3 (*)    Hemoglobin 9.0 (*)    HCT 30.8 (*)    MCV 78.0 (*)    MCH 22.8 (*)    MCHC 29.2 (*)    RDW 16.8 (*)    Neutro Abs 12.9 (*)    All other components within normal limits  COMPREHENSIVE METABOLIC PANEL - Abnormal; Notable for the following components:   CO2 13 (*)    Glucose, Bld 219 (*)    BUN 32 (*)    Creatinine, Ser 3.18 (*)    Albumin 3.2 (*)    GFR calc non Af Amer 17 (*)    GFR calc Af Amer 20 (*)    Anion gap 18 (*)    All other components within normal limits  URINALYSIS, ROUTINE W REFLEX MICROSCOPIC - Abnormal; Notable for the following  components:   APPearance HAZY (*)    Glucose, UA 150 (*)    Hgb urine dipstick MODERATE (*)    Ketones, ur 20 (*)    Protein, ur >=300 (*)    Bacteria, UA FEW (*)    Trichomonas, UA PRESENT (*)    All other components within normal limits  SARS CORONAVIRUS 2 BY RT PCR (HOSPITAL ORDER, [place] [facility])  URINE CULTURE  CULTURE, BLOOD (ROUTINE X 2)  CULTURE, BLOOD (ROUTINE X 2)  WET PREP, GENITAL  LIPASE, BLOOD  LACTIC ACID, PLASMA  LACTIC ACID, PLASMA  HIV ANTIBODY (ROUTINE TESTING W REFLEX)  RPR  GC/CHLAMYDIA PROBE AMP ([place]) NOT AT [facility]    EKG None  Radiology CT ABDOMEN PELVIS WO CONTRAST  Result Date: 08/03/2019 CLINICAL DATA:  Epigastric pain and nausea and vomiting beginning yesterday. EXAM: CT ABDOMEN AND PELVIS WITHOUT CONTRAST TECHNIQUE: Multidetector CT imaging of the abdomen and pelvis was performed following the standard protocol without IV contrast. COMPARISON:  08/11/2018 FINDINGS: Lower chest: No acute findings. Hepatobiliary: No mass visualized on this unenhanced exam. Gallbladder is unremarkable. No evidence of biliary ductal dilatation. Pancreas: No mass or inflammatory process visualized on this unenhanced exam. Spleen:  Within normal limits in size. Adrenals/Urinary tract: No evidence of urolithiasis or hydronephrosis. Unremarkable unopacified urinary bladder. Stomach/Bowel: No evidence of hiatal hernia, however or wall thickening is seen involving the visualized portion of the distal esophagus, suspicious for esophagitis. No evidence of obstruction, inflammatory process, or abnormal fluid collections. Normal appendix visualized. Vascular/Lymphatic: No pathologically enlarged lymph nodes identified. No evidence of abdominal aortic aneurysm. Reproductive:  No mass or other significant abnormality. Other:  None.  Musculoskeletal:  No suspicious bone lesions identified. IMPRESSION: Wall thickening involving the visualized portion of the distal  esophagus, suspicious for esophagitis. Consider upper endoscopy for further evaluation. No other significant abnormality identified. Electronically Signed   By: Marlaine Hind M.D.   On: 08/03/2019 17:16   DG ABD ACUTE 2+V W 1V CHEST  Result Date: 08/02/2019 CLINICAL DATA:  Abdominal tenderness and emesis EXAM: DG ABDOMEN ACUTE W/ 1V CHEST COMPARISON:  Abdomen series October 12, 2011 FINDINGS: PA chest: Small granuloma right upper lobe. Lungs elsewhere clear. Heart size and pulmonary vascularity are normal. No adenopathy. Supine and upright abdomen: There is no bowel dilatation or air-fluid level to suggest bowel obstruction. No free air. There are probable phleboliths in the right pelvis. IMPRESSION: No evident bowel obstruction or free air. No edema or airspace opacity. Small granuloma right upper lobe. Electronically Signed   By: Lowella Grip III M.D.   On: 08/02/2019 11:26    Procedures Procedures (including critical care time)  Medications Ordered in ED Medications  alum & mag hydroxide-simeth (MAALOX/MYLANTA) 200-200-20 MG/5ML suspension 30 mL (0 mLs Oral Hold 08/03/19 1530)  pantoprazole (PROTONIX) injection 40 mg (has no administration in time range)  ondansetron (ZOFRAN) injection 4 mg (has no administration in time range)  sodium chloride 0.9 % bolus 1,000 mL (has no administration in time range)  metroNIDAZOLE (FLAGYL) IVPB 2,000 mg 400 mL (has no administration in time range)  sodium chloride 0.9 % bolus 1,000 mL (0 mLs Intravenous Stopped 08/03/19 1436)  haloperidol lactate (HALDOL) injection 2 mg (2 mg Intravenous Given 08/03/19 1314)  LORazepam (ATIVAN) injection 1 mg (1 mg Intravenous Given 08/03/19 1324)  sodium chloride 0.9 % bolus 1,000 mL (0 mLs Intravenous Stopped 08/03/19 1615)  acetaminophen (TYLENOL) tablet 650 mg (650 mg Oral Given 08/03/19 1514)  famotidine (PEPCID) IVPB 20 mg premix (0 mg Intravenous Stopped 08/03/19 1612)    ED Course  I have reviewed the triage  vital signs and the nursing notes.  Pertinent labs & imaging results that were available during my care of the patient were reviewed by me and considered in my medical decision making (see chart for details).  Clinical Course as of Aug 02 1741  Fri Aug 02, 1464  3930 39 year old female here with hyperemesis.  Reporting significant gastric pain and dry heaving for 4 days.  The patient was seen in the emergency department yesterday for same complaints.  On exam she is persistently dry heaving.  She has a difficult abdominal exam due to her level of discomfort.  I placed an ultrasound-guided IV.  We will give her IV Ativan and Haldol for her nausea and anxiety.  We will recheck her labs today.  We will also give her some IV fluids.   [MT]    Clinical Course User Index [MT] Trifan, Carola Rhine, MD   MDM Rules/Calculators/A&P                          39 year old female presenting for evaluation of nausea vomiting that started 2 days ago.  Denies any abdominal pain or fevers.  Denies any urinary symptoms.  Patient found to be tachycardic and borderline febrile.  Will get labs, UA, CT scan.  CBC showed leukocytosis, which is new from yesterday.  Hemoglobin stable CMP with low bicarb 13, elevated blood glucose, AKI on CKD and elevated anion gap. Lipase negative Lactic acid negative Covid negative UA with hematuria, proteinuria, no leukocytes or nitrites.  Incidentally found trichomonas  -Patient states she is asymptomatic at this time.  No lower abdominal pain or tenderness to suggest PID.  Will add GC/chlamydia, HIV and RPR.  CT abdomen/pelvis shows esophagitis but no other emergent intra-abdominal process at this time.  Patient continues to feel nauseated and unable to tolerate PO after multiple rounds of antiemetics.  Also with AKI on CKD.  Will plan for admission for further tx.   5:39 PM CONSULT with Dr. Sloan Leiter with hospitalist service who accepts patient for admission.   Final Clinical  Impression(s) / ED Diagnoses Final diagnoses:  Intractable nausea and vomiting  AKI (acute kidney injury) [facility])    Rx / DC Orders ED Discharge Orders    None       Bishop Dublin 08/03/19 1743    Wyvonnia Dusky, MD 08/03/19 1758

## 2019-08-03 NOTE — Discharge Instructions (Signed)
Very important that you drink plenty of water throughout the day. Continue checking sugars. Important to go to ER for worsening vomiting, weakness, or you develop chest pain, severe abdominal pain, blood in your stool, urine, or vomit.

## 2019-08-03 NOTE — H&P (Signed)
History and Physical    [name] QPY:[id number] DOB: [birth year] DOA: 08/03/2019  PCP: Patient, No Pcp Per  Patient coming from: Home  I have personally briefly reviewed patient's old medical records available.   Chief Complaint: Intractable nausea, epigastric pain for 4 days.  HPI: [name] is a 39 y.o. female with medical history significant of type 2 diabetes on insulin with last known A1c of 12, hypertension, chronic kidney disease status 4, asthma and marijuana use presented to the emergency room with ongoing nausea and vomiting not relieved with multiple attempts outpatient and ER visits for last 4 days.  Patient is overall poor historian.  According to the patient, this is her first episode since 2013 that she had such intractable nausea and vomiting.  Apparently does have history of reflux and is on Protonix but she is not taking it.  It started spontaneously, gradually worsening in severity, since last 2 days she has not been able to eat or drink anything.  Denies any pain.  She has burning sensation on her epigastrium not relieved with Maalox.  Denies any hematemesis, hematochezia or melena.  Denies any biliary vomiting.  She visited emergency room yesterday, treated with IV fluids, was reasonably doing well and discharged with advice.  Comes back today with uncontrolled symptoms. Denies any fever or chills.  Denies any headache or dizziness.  Feels weak and sleepy.  Denies any chest pain or wheezing.  Denies any abdominal pain.  Has regular bowel habits.  She denies any urinary symptoms or vaginal drainage. ED Course: Tachycardic with heart rate more than 100.  Blood pressures 200/108, intractable vomiting despite multiple medications, WBC 14.3 and it was 7.5 yesterday.  Creatinine 3.18, historical baseline about 2.7.  Blood sugars 219 and anion gap of 18.  Chest x-ray normal.  COVID-19 negative.  CT scan with thickened lower esophagus consistent with esophagitis. Patient was given  multiple rounds of Zofran, Pepcid, gastric cocktail, 3 L of fluid and advised admission because of persistent symptoms.  On my evaluation, she feels somehow hydrated but still afraid of eating or drinking.  Review of Systems: all systems are reviewed and pertinent positive as per HPI otherwise rest are negative.    Past Medical History:  Diagnosis Date  . Anxiety   . Asthma   . Depression   . Diabetes mellitus   . Hyperemesis arising during pregnancy   . Hypertension   . Mental disorder     Past Surgical History:  Procedure Laterality Date  . NO PAST SURGERIES    . TUBAL LIGATION  06/20/2011   Procedure: POST PARTUM TUBAL LIGATION;  Surgeon: Mora Bellman, MD;  Location: [facility];  Service: Gynecology;  Laterality: Bilateral;  Bilateral post partum tubal ligation     reports that she has never smoked. She has never used smokeless tobacco. She reports that she does not drink alcohol and does not use drugs.  Allergies  Allergen Reactions  . Shellfish Allergy Anaphylaxis and Swelling  . Morphine And Related Nausea And Vomiting    Family History  Problem Relation Age of Onset  . Heart disease Mother        CHF  . Diabetes Sister   . Anesthesia problems Neg Hx      Prior to Admission medications   Medication Sig Start Date End Date Taking? Authorizing Provider  acetaminophen (TYLENOL) 325 MG tablet Take 2 tablets (650 mg total) by mouth every 6 (six) hours as needed. 10/21/16  Yes Jacqlyn Larsen,  PA-C  amLODipine (NORVASC) 10 MG tablet Take 1 tablet (10 mg total) by mouth daily. 08/25/18   Thurnell Lose, MD  benzonatate (TESSALON PERLES) 100 MG capsule Take 1 capsule (100 mg total) by mouth 3 (three) times daily as needed for cough. 02/06/16   Mackuen, Courteney Lyn, MD  ferrous sulfate 325 (65 FE) MG tablet Take 1 tablet (325 mg total) by mouth 3 (three) times daily with meals. 02/18/16   Velvet Bathe, MD  hydrALAZINE (APRESOLINE) 50 MG tablet Take 1 tablet (50 mg total) by  mouth 3 (three) times daily. 08/25/18 08/03/19  Thurnell Lose, MD  insulin aspart (NOVOLOG) 100 UNIT/ML injection Before each meal 3 times a day , 140-199 - 2 units, 200-250 - 4 units, 251-299 - 6 units,  300-349 - 8 units,  350 or above 10 units. Insulin PEN if approved, provide syringes and needles if needed. Can switch to any other cheaper alternative. Patient taking differently: Inject 2-10 Units into the skin See admin instructions. Before each meal 3 times a day , 140-199 - 2 units, 200-250 - 4 units, 251-299 - 6 units,  300-349 - 8 units,  350 or above 10 units. 08/25/18   Thurnell Lose, MD  insulin glargine (LANTUS) 100 UNIT/ML injection Inject 0.4 mLs (40 Units total) into the skin at bedtime. Dispense insulin pen if approved, if not dispense as needed syringes and needles for 1 month supply. Can switch to Levemir. Diagnosis E 11.65. Patient taking differently: Inject 40 Units into the skin at bedtime.  08/25/18   Thurnell Lose, MD  Insulin Syringe-Needle U-100 25G X 1" 1 ML MISC For 4 times a day insulin SQ, 1 month supply. Diagnosis E11.65 08/25/18   Thurnell Lose, MD  metFORMIN (GLUCOPHAGE) 500 MG tablet Take 1 tablet (500 mg total) by mouth 2 (two) times daily with a meal. 08/11/18   Jacqlyn Larsen, PA-C  methocarbamol (ROBAXIN) 500 MG tablet Take 1 tablet (500 mg total) by mouth 2 (two) times daily. 10/21/16   Jacqlyn Larsen, PA-C  ondansetron (ZOFRAN ODT) 4 MG disintegrating tablet Take 1 tablet (4 mg total) by mouth every 8 (eight) hours as needed for nausea or vomiting. 08/02/19   Maudie Flakes, MD  pantoprazole (PROTONIX) 40 MG tablet Take 1 tablet (40 mg total) by mouth daily. 08/03/19   Hall-Potvin, [place], PA-C  promethazine-dextromethorphan (PROMETHAZINE-DM) 6.25-15 MG/5ML syrup Take 5 mLs by mouth 4 (four) times daily as needed for cough. 02/06/17   Tanna Furry, MD  traMADol (ULTRAM) 50 MG tablet Take 1 tablet (50 mg total) by mouth every 6 (six) hours as needed. 02/06/17    Tanna Furry, MD    Physical Exam: Vitals:   08/03/19 1237 08/03/19 1423 08/03/19 1529 08/03/19 1807  BP: (!) 173/109  (!) 183/88 (!) 194/106  Pulse: (!) 115  (!) 118 (!) 111  Resp: 14  16 (!) 23  Temp:  100 F (37.8 C)    TempSrc:  Rectal    SpO2: 98%  99% 100%    Constitutional: NAD, calm, comfortable Vitals:   08/03/19 1237 08/03/19 1423 08/03/19 1529 08/03/19 1807  BP: (!) 173/109  (!) 183/88 (!) 194/106  Pulse: (!) 115  (!) 118 (!) 111  Resp: 14  16 (!) 23  Temp:  100 F (37.8 C)    TempSrc:  Rectal    SpO2: 98%  99% 100%   Eyes: PERRL, lids and conjunctivae normal ENMT: Mucous membranes are moist.  Neck: normal, supple, no masses, no thyromegaly Respiratory: clear to auscultation bilaterally, no wheezing, no crackles. Normal respiratory effort. No accessory muscle use.  Cardiovascular: Regular rate and rhythm, tachycardic.  No murmurs / rubs / gallops. No extremity edema. 2+ pedal pulses. Abdomen: no tenderness, no masses palpated. No hepatosplenomegaly. Bowel sounds positive.  Musculoskeletal: no clubbing / cyanosis. No joint deformity upper and lower extremities. Good ROM, no contractures. Normal muscle tone.  Skin: no rashes, lesions, ulcers. No induration Neurologic: CN 2-12 grossly intact. Sensation intact, DTR normal. Strength 5/5 in all 4.  Psychiatric: Normal judgment and insight. Alert and oriented x 3.  Mood is normal.  Affect is flat.    Labs on Admission: I have personally reviewed following labs and imaging studies  CBC: Recent Labs  Lab 08/02/19 0806 08/03/19 1344  WBC 7.5 14.3*  NEUTROABS  --  12.9*  HGB 9.7* 9.0*  HCT 31.7* 30.8*  MCV 76.6* 78.0*  PLT 373 353   Basic Metabolic Panel: Recent Labs  Lab 08/02/19 0806 08/03/19 1344  NA 135 136  K 4.9 4.8  CL 108 105  CO2 16* 13*  GLUCOSE 176* 219*  BUN 24* 32*  CREATININE 2.70* 3.18*  CALCIUM 9.3 9.2   GFR: CrCl cannot be calculated (Unknown ideal weight.). Liver Function  Tests: Recent Labs  Lab 08/02/19 0806 08/03/19 1344  AST 16 26  ALT 13 17  ALKPHOS 74 69  BILITOT 0.8 0.6  PROT 8.0 7.9  ALBUMIN 3.2* 3.2*   Recent Labs  Lab 08/02/19 0806 08/03/19 1344  LIPASE 32 27   No results for input(s): AMMONIA in the last 168 hours. Coagulation Profile: No results for input(s): INR, PROTIME in the last 168 hours. Cardiac Enzymes: No results for input(s): CKTOTAL, CKMB, CKMBINDEX, TROPONINI in the last 168 hours. BNP (last 3 results) No results for input(s): PROBNP in the last 8760 hours. HbA1C: No results for input(s): HGBA1C in the last 72 hours. CBG: Recent Labs  Lab 08/02/19 1133  GLUCAP 127*   Lipid Profile: No results for input(s): CHOL, HDL, LDLCALC, TRIG, CHOLHDL, LDLDIRECT in the last 72 hours. Thyroid Function Tests: No results for input(s): TSH, T4TOTAL, FREET4, T3FREE, THYROIDAB in the last 72 hours. Anemia Panel: No results for input(s): VITAMINB12, FOLATE, FERRITIN, TIBC, IRON, RETICCTPCT in the last 72 hours. Urine analysis:    Component Value Date/Time   COLORURINE YELLOW 08/03/2019 1609   APPEARANCEUR HAZY (A) 08/03/2019 1609   LABSPEC 1.019 08/03/2019 1609   PHURINE 6.0 08/03/2019 1609   GLUCOSEU 150 (A) 08/03/2019 1609   HGBUR MODERATE (A) 08/03/2019 1609   BILIRUBINUR NEGATIVE 08/03/2019 1609   KETONESUR 20 (A) 08/03/2019 1609   PROTEINUR >=300 (A) 08/03/2019 1609   UROBILINOGEN 1.0 08/11/2013 1128   NITRITE NEGATIVE 08/03/2019 1609   LEUKOCYTESUR NEGATIVE 08/03/2019 1609    Radiological Exams on Admission: CT ABDOMEN PELVIS WO CONTRAST  Result Date: 08/03/2019 CLINICAL DATA:  Epigastric pain and nausea and vomiting beginning yesterday. EXAM: CT ABDOMEN AND PELVIS WITHOUT CONTRAST TECHNIQUE: Multidetector CT imaging of the abdomen and pelvis was performed following the standard protocol without IV contrast. COMPARISON:  08/11/2018 FINDINGS: Lower chest: No acute findings. Hepatobiliary: No mass visualized on this  unenhanced exam. Gallbladder is unremarkable. No evidence of biliary ductal dilatation. Pancreas: No mass or inflammatory process visualized on this unenhanced exam. Spleen:  Within normal limits in size. Adrenals/Urinary tract: No evidence of urolithiasis or hydronephrosis. Unremarkable unopacified urinary bladder. Stomach/Bowel: No evidence of hiatal hernia, however  or wall thickening is seen involving the visualized portion of the distal esophagus, suspicious for esophagitis. No evidence of obstruction, inflammatory process, or abnormal fluid collections. Normal appendix visualized. Vascular/Lymphatic: No pathologically enlarged lymph nodes identified. No evidence of abdominal aortic aneurysm. Reproductive:  No mass or other significant abnormality. Other:  None. Musculoskeletal:  No suspicious bone lesions identified. IMPRESSION: Wall thickening involving the visualized portion of the distal esophagus, suspicious for esophagitis. Consider upper endoscopy for further evaluation. No other significant abnormality identified. Electronically Signed   By: Marlaine Hind M.D.   On: 08/03/2019 17:16   DG ABD ACUTE 2+V W 1V CHEST  Result Date: 08/02/2019 CLINICAL DATA:  Abdominal tenderness and emesis EXAM: DG ABDOMEN ACUTE W/ 1V CHEST COMPARISON:  Abdomen series October 12, 2011 FINDINGS: PA chest: Small granuloma right upper lobe. Lungs elsewhere clear. Heart size and pulmonary vascularity are normal. No adenopathy. Supine and upright abdomen: There is no bowel dilatation or air-fluid level to suggest bowel obstruction. No free air. There are probable phleboliths in the right pelvis. IMPRESSION: No evident bowel obstruction or free air. No edema or airspace opacity. Small granuloma right upper lobe. Electronically Signed   By: Lowella Grip III M.D.   On: 08/02/2019 11:26    EKG: Independently reviewed.  Normal sinus rhythm.  No abnormal ST or T waves.  QTc is 394.  Assessment/Plan Principal Problem:    Intractable nausea and vomiting Active Problems:   Type 2 diabetes mellitus (HCC)   Depression   ARF (acute renal failure) ([place])   Uncontrolled type 2 diabetes mellitus with hyperglycemia ([place])     1.  Intractable nausea and vomiting: Probably underlying history of GERD and aggravated by uncontrolled diabetes or marijuana use. Agree with admission given severity of symptoms. Clears and ice chips.  Patient is adequately resuscitated with 3 L of IV fluids, will continue on maintenance IV fluids.  Adequate nausea medications.  Can use Zofran, her QTC is 394. Pepcid 20 mg IV twice daily.  If persistent symptoms, will discuss with gastroenterology.  If symptoms improve, will send for outpatient follow-up.  She will ultimately need an upper GI endoscopy.  2.  Uncontrolled type 2 diabetes with hyperglycemia: Her blood sugars are 200.  Resuscitated with fluid.  Anion gap is 18, however no evidence of DKA.  With reasonably low blood sugars and already received resuscitation, will start patient on every 4 hour sliding scale insulin with very close monitoring of blood sugars.  At this time, there is no indication for insulin infusion.  Will check chemistry in 2 hours to evaluate for electrolytes and anion gap. Since she is not eating, will hold off on long-acting insulin tonight.  Patient stated she took 40 units of insulin yesterday. Check A1c.  Unsure about compliance.  We will continue to monitor and uptitrate as needed.  3.  Hypertension: Accelerated.  Resume amlodipine and hydralazine as much she can take.  Keep on as needed beta-blockers to control her blood pressures.  Hopefully with symptom control and resuming oral medications, her blood pressure will be controlled.  4.  Acute renal failure with history of underlying chronic kidney disease stage IV: At least 6 months ago her creatinine is 2.7.  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  Aggressive hydration and monitoring levels.   DVT  prophylaxis: Lovenox subcu Code Status: Full code Family Communication: None Disposition Plan: Home Consults called: None Admission status: Observation, MedSurg with telemetry.   Barb Merino MD [facility] Pager [phone]   

## 2019-08-03 NOTE — ED Provider Notes (Signed)
[facility]    CSN: [id number] Arrival date & time: 08/03/19  0803      History   Chief Complaint Chief Complaint  Patient presents with  . Emesis    HPI [name] is a 39 y.o. female with history of obesity, hypertension, asthma, diabetes presenting for persistent nausea and vomiting.  Patient seen in ER yesterday for the same: Please see those notes for additional HPI.  Patient given 2 L of fluid, had basic lab work done, discharged home due to not being in DKA, and her requesting to leave.  Since then, patient states she has taken Zofran: Able to keep down water, blood pressure medication.  Denies headache, change in vision, weakness, chest pain, palpitations, difficulty breathing, severe abdominal pain.  Unsure how many times she has vomited.  Denies blood in stool or bowel.  Denies biliary or bloody emesis, fever.   Past Medical History:  Diagnosis Date  . Anxiety   . Asthma   . Depression   . Diabetes mellitus   . Hyperemesis arising during pregnancy   . Hypertension   . Mental disorder     Patient Active Problem List   Diagnosis Date Noted  . ARF (acute renal failure) ([place]) 08/24/2018  . Uncontrolled type 2 diabetes mellitus with hyperglycemia ([place]) 08/24/2018  . DKA (diabetic ketoacidoses) ([place]) 08/24/2018  . Non-intractable vomiting   . Sepsis ([place]) 02/15/2016  . Generalized abdominal pain 02/15/2016  . CAP (community acquired pneumonia) 02/14/2016  . 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 Back pain 07/28/2011  . H. pylori infection 11/24/2010  . Depression 11/20/2010  . Asthma 11/20/2010  . Chronic hypertension in pregnancy 11/08/2010  . Type 2 diabetes mellitus ([place]) 11/07/2010    Past Surgical History:  Procedure Laterality Date  . NO PAST SURGERIES    . TUBAL LIGATION  06/20/2011   Procedure: POST PARTUM TUBAL LIGATION;  Surgeon: Mora Bellman, MD;  Location: [facility];  Service: Gynecology;  Laterality: Bilateral;  Bilateral post partum  tubal ligation    OB History    Gravida  4   Para  3   Term  2   Preterm  1   AB  1   Living  3     SAB  0   TAB  0   Ectopic  1   Multiple  0   Live Births  3            Home Medications    Prior to Admission medications   Medication Sig Start Date End Date Taking? Authorizing Provider  acetaminophen (TYLENOL) 325 MG tablet Take 2 tablets (650 mg total) by mouth every 6 (six) hours as needed. 10/21/16   Jacqlyn Larsen, PA-C  amLODipine (NORVASC) 10 MG tablet Take 1 tablet (10 mg total) by mouth daily. 08/25/18   Thurnell Lose, MD  benzonatate (TESSALON PERLES) 100 MG capsule Take 1 capsule (100 mg total) by mouth 3 (three) times daily as needed for cough. Patient not taking: Reported on 02/14/2016 02/06/16   Mackuen, Courteney Lyn, MD  ferrous sulfate 325 (65 FE) MG tablet Take 1 tablet (325 mg total) by mouth 3 (three) times daily with meals. Patient not taking: Reported on 08/24/2018 02/18/16   Velvet Bathe, MD  hydrALAZINE (APRESOLINE) 50 MG tablet Take 1 tablet (50 mg total) by mouth 3 (three) times daily. 08/25/18 09/24/18  Thurnell Lose, MD  insulin aspart (NOVOLOG) 100 UNIT/ML injection Before each meal 3 times a  day , 140-199 - 2 units, 200-250 - 4 units, 251-299 - 6 units,  300-349 - 8 units,  350 or above 10 units. Insulin PEN if approved, provide syringes and needles if needed. Can switch to any other cheaper alternative. Patient not taking: Reported on 08/02/2019 08/25/18   Thurnell Lose, MD  insulin glargine (LANTUS) 100 UNIT/ML injection Inject 0.4 mLs (40 Units total) into the skin at bedtime. Dispense insulin pen if approved, if not dispense as needed syringes and needles for 1 month supply. Can switch to Levemir. Diagnosis E 11.65. Patient taking differently: Inject 40 Units into the skin at bedtime. Dispense insulin pen if approved, if not dispense as needed syringes and needles for 1 month supply. Can switch to Levemir. Diagnosis E 11.65. Currently  uses only 5 units. 08/25/18   Thurnell Lose, MD  Insulin Syringe-Needle U-100 25G X 1" 1 ML MISC For 4 times a day insulin SQ, 1 month supply. Diagnosis E11.65 08/25/18   Thurnell Lose, MD  metFORMIN (GLUCOPHAGE) 500 MG tablet Take 1 tablet (500 mg total) by mouth 2 (two) times daily with a meal. Patient not taking: Reported on 08/02/2019 08/11/18   Jacqlyn Larsen, PA-C  methocarbamol (ROBAXIN) 500 MG tablet Take 1 tablet (500 mg total) by mouth 2 (two) times daily. Patient not taking: Reported on 08/24/2018 10/21/16   Jacqlyn Larsen, PA-C  ondansetron (ZOFRAN ODT) 4 MG disintegrating tablet Take 1 tablet (4 mg total) by mouth every 8 (eight) hours as needed for nausea or vomiting. 08/02/19   Maudie Flakes, MD  pantoprazole (PROTONIX) 40 MG tablet Take 1 tablet (40 mg total) by mouth daily. 08/03/19   Hall-Potvin, [place], PA-C  promethazine-dextromethorphan (PROMETHAZINE-DM) 6.25-15 MG/5ML syrup Take 5 mLs by mouth 4 (four) times daily as needed for cough. Patient not taking: Reported on 08/24/2018 02/06/17   Tanna Furry, MD  traMADol (ULTRAM) 50 MG tablet Take 1 tablet (50 mg total) by mouth every 6 (six) hours as needed. Patient not taking: Reported on 08/24/2018 02/06/17   Tanna Furry, MD    Family History Family History  Problem Relation Age of Onset  . Heart disease Mother        CHF  . Diabetes Sister   . Anesthesia problems Neg Hx     Social History Social History   Tobacco Use  . Smoking status: Never Smoker  . Smokeless tobacco: Never Used  Substance Use Topics  . Alcohol use: No    Comment: occassionally  . Drug use: No    Types: Cocaine, Marijuana     Allergies   Shellfish allergy and Morphine and related   Review of Systems As per HPI   Physical Exam Triage Vital Signs ED Triage Vitals [08/03/19 0810]  Enc Vitals Group     BP (!) 200/109     Pulse Rate (!) 116     Resp 18     Temp 98.2 F (36.8 C)     Temp src      SpO2 98 %     Weight      Height       Head Circumference      Peak Flow      Pain Score 10     Pain Loc      Pain Edu?      Excl. in [place]?    No data found.  Updated Vital Signs BP (!) 200/109   Pulse (!) 116   Temp 98.2  F (36.8 C)   Resp 18   LMP 07/05/2019 Comment: pt has tubal ligation  SpO2 98%   Visual Acuity Right Eye Distance:   Left Eye Distance:   Bilateral Distance:    Right Eye Near:   Left Eye Near:    Bilateral Near:     Physical Exam Constitutional:      General: She is not in acute distress.    Appearance: She is obese. She is ill-appearing. She is not toxic-appearing or diaphoretic.  HENT:     Head: Normocephalic and atraumatic.     Mouth/Throat:     Mouth: Mucous membranes are moist.     Pharynx: Oropharynx is clear.  Eyes:     General: No scleral icterus.    Conjunctiva/sclera: Conjunctivae normal.     Pupils: Pupils are equal, round, and reactive to light.  Cardiovascular:     Rate and Rhythm: Regular rhythm. Tachycardia present.     Heart sounds: Normal heart sounds.  Pulmonary:     Effort: Pulmonary effort is normal. No respiratory distress.     Breath sounds: No wheezing.  Abdominal:     General: Bowel sounds are normal.     Palpations: Abdomen is soft.     Tenderness: There is abdominal tenderness.     Comments: Mild, diffuse  Musculoskeletal:     Cervical back: Neck supple.  Lymphadenopathy:     Cervical: No cervical adenopathy.  Skin:    Coloration: Skin is not jaundiced or pale.  Neurological:     Mental Status: She is alert and oriented to person, place, and time.      UC Treatments / Results  Labs (all labs ordered are listed, but only abnormal results are displayed) Labs Reviewed - No data to display  EKG   Radiology   Procedures Procedures (including critical care time)  Medications Ordered in UC Medications  alum & mag hydroxide-simeth (MAALOX/MYLANTA) 200-200-20 MG/5ML suspension 30 mL (30 mLs Oral Given 08/03/19 0818)    And  lidocaine  (XYLOCAINE) 2 % viscous mouth solution 15 mL (15 mLs Oral Given 08/03/19 0818)  ondansetron (ZOFRAN-ODT) disintegrating tablet 4 mg (4 mg Oral Given 08/03/19 0818)    Initial Impression / Assessment and Plan / UC Course  I have reviewed the triage vital signs and the nursing notes.  Pertinent labs & imaging results that were available during my care of the patient were reviewed by me and considered in my medical decision making (see chart for details).     Afebrile, nontoxic in office today.  Patient does appear ill, feels nauseous in office.  Given Zofran ODT, GI cocktail with lidocaine as patient reports severe GERD.  Tolerated this well: Endorsed improvement in symptoms at time of discharge.  Reviewed ER records at time of visit.  Imaging unremarkable, labs significant for elevated glucose and serum creatinine (+0.32 from baseline).  Patient states she has been able to tolerate water, does not appear dehydrated here in office.  Blood pressure is elevated, though patient denies signs/symptoms of endorgan damage as mentioned in HPI.  Discussed utility of going to ER for further evaluation given recent discharge, comorbidities, clinical status: Patient declined-would rather try pushing fluids at home.  Return precautions discussed, patient verbalized understanding and is agreeable to plan. Final Clinical Impressions(s) / UC Diagnoses   Final diagnoses:  Nausea and vomiting, intractability of vomiting not specified, unspecified vomiting type  Hypertensive urgency     Discharge Instructions     Very important  that you drink plenty of water throughout the day. Continue checking sugars. Important to go to ER for worsening vomiting, weakness, or you develop chest pain, severe abdominal pain, blood in your stool, urine, or vomit.    ED Prescriptions    Medication Sig Dispense Auth. Provider   pantoprazole (PROTONIX) 40 MG tablet Take 1 tablet (40 mg total) by mouth daily. 30 tablet Hall-Potvin,  [place], PA-C     PDMP not reviewed this encounter.   Hall-Potvin, [place], [place] 08/03/19 [phone]

## 2019-08-04 DIAGNOSIS — K2100 Gastro-esophageal reflux disease with esophagitis, without bleeding: Secondary | ICD-10-CM | POA: Diagnosis present

## 2019-08-04 DIAGNOSIS — N184 Chronic kidney disease, stage 4 (severe): Secondary | ICD-10-CM | POA: Diagnosis present

## 2019-08-04 DIAGNOSIS — N179 Acute kidney failure, unspecified: Secondary | ICD-10-CM | POA: Diagnosis present

## 2019-08-04 DIAGNOSIS — E1122 Type 2 diabetes mellitus with diabetic chronic kidney disease: Secondary | ICD-10-CM | POA: Diagnosis present

## 2019-08-04 DIAGNOSIS — R112 Nausea with vomiting, unspecified: Secondary | ICD-10-CM | POA: Diagnosis present

## 2019-08-04 DIAGNOSIS — Z794 Long term (current) use of insulin: Secondary | ICD-10-CM | POA: Diagnosis not present

## 2019-08-04 DIAGNOSIS — F329 Major depressive disorder, single episode, unspecified: Secondary | ICD-10-CM | POA: Diagnosis present

## 2019-08-04 DIAGNOSIS — E876 Hypokalemia: Secondary | ICD-10-CM | POA: Diagnosis not present

## 2019-08-04 DIAGNOSIS — E860 Dehydration: Secondary | ICD-10-CM | POA: Diagnosis present

## 2019-08-04 DIAGNOSIS — E1165 Type 2 diabetes mellitus with hyperglycemia: Secondary | ICD-10-CM | POA: Diagnosis present

## 2019-08-04 DIAGNOSIS — E872 Acidosis: Secondary | ICD-10-CM | POA: Diagnosis not present

## 2019-08-04 DIAGNOSIS — F1290 Cannabis use, unspecified, uncomplicated: Secondary | ICD-10-CM | POA: Diagnosis present

## 2019-08-04 DIAGNOSIS — Z79891 Long term (current) use of opiate analgesic: Secondary | ICD-10-CM | POA: Diagnosis not present

## 2019-08-04 DIAGNOSIS — D509 Iron deficiency anemia, unspecified: Secondary | ICD-10-CM | POA: Diagnosis present

## 2019-08-04 DIAGNOSIS — I129 Hypertensive chronic kidney disease with stage 1 through stage 4 chronic kidney disease, or unspecified chronic kidney disease: Secondary | ICD-10-CM | POA: Diagnosis present

## 2019-08-04 DIAGNOSIS — Z8249 Family history of ischemic heart disease and other diseases of the circulatory system: Secondary | ICD-10-CM | POA: Diagnosis not present

## 2019-08-04 DIAGNOSIS — Z833 Family history of diabetes mellitus: Secondary | ICD-10-CM | POA: Diagnosis not present

## 2019-08-04 DIAGNOSIS — Z20822 Contact with and (suspected) exposure to covid-19: Secondary | ICD-10-CM | POA: Diagnosis present

## 2019-08-04 DIAGNOSIS — K449 Diaphragmatic hernia without obstruction or gangrene: Secondary | ICD-10-CM | POA: Diagnosis present

## 2019-08-04 DIAGNOSIS — Z79899 Other long term (current) drug therapy: Secondary | ICD-10-CM | POA: Diagnosis not present

## 2019-08-04 DIAGNOSIS — J45909 Unspecified asthma, uncomplicated: Secondary | ICD-10-CM | POA: Diagnosis present

## 2019-08-04 LAB — GLUCOSE, CAPILLARY
Glucose-Capillary: 103 mg/dL — ABNORMAL HIGH (ref 70–99)
Glucose-Capillary: 153 mg/dL — ABNORMAL HIGH (ref 70–99)
Glucose-Capillary: 169 mg/dL — ABNORMAL HIGH (ref 70–99)
Glucose-Capillary: 184 mg/dL — ABNORMAL HIGH (ref 70–99)
Glucose-Capillary: 188 mg/dL — ABNORMAL HIGH (ref 70–99)
Glucose-Capillary: 209 mg/dL — ABNORMAL HIGH (ref 70–99)
Glucose-Capillary: 212 mg/dL — ABNORMAL HIGH (ref 70–99)

## 2019-08-04 LAB — CBC
HCT: 31.2 % — ABNORMAL LOW (ref 36.0–46.0)
Hemoglobin: 9.5 g/dL — ABNORMAL LOW (ref 12.0–15.0)
MCH: 23 pg — ABNORMAL LOW (ref 26.0–34.0)
MCHC: 30.4 g/dL (ref 30.0–36.0)
MCV: 75.5 fL — ABNORMAL LOW (ref 80.0–100.0)
Platelets: 400 10*3/uL (ref 150–400)
RBC: 4.13 MIL/uL (ref 3.87–5.11)
RDW: 16.7 % — ABNORMAL HIGH (ref 11.5–15.5)
WBC: 11.8 10*3/uL — ABNORMAL HIGH (ref 4.0–10.5)
nRBC: 0 % (ref 0.0–0.2)

## 2019-08-04 LAB — COMPREHENSIVE METABOLIC PANEL
ALT: 17 U/L (ref 0–44)
AST: 26 U/L (ref 15–41)
Albumin: 2.9 g/dL — ABNORMAL LOW (ref 3.5–5.0)
Alkaline Phosphatase: 76 U/L (ref 38–126)
Anion gap: 13 (ref 5–15)
BUN: 32 mg/dL — ABNORMAL HIGH (ref 6–20)
CO2: 15 mmol/L — ABNORMAL LOW (ref 22–32)
Calcium: 8.6 mg/dL — ABNORMAL LOW (ref 8.9–10.3)
Chloride: 111 mmol/L (ref 98–111)
Creatinine, Ser: 2.82 mg/dL — ABNORMAL HIGH (ref 0.44–1.00)
GFR calc Af Amer: 23 mL/min — ABNORMAL LOW (ref >60)
GFR calc non Af Amer: 20 mL/min — ABNORMAL LOW (ref >60)
Glucose, Bld: 185 mg/dL — ABNORMAL HIGH (ref 70–99)
Potassium: 4 mmol/L (ref 3.5–5.1)
Sodium: 139 mmol/L (ref 135–145)
Total Bilirubin: 0.6 mg/dL (ref 0.3–1.2)
Total Protein: 7.3 g/dL (ref 6.5–8.1)

## 2019-08-04 LAB — PHOSPHORUS: Phosphorus: 3.3 mg/dL (ref 2.5–4.6)

## 2019-08-04 LAB — MAGNESIUM: Magnesium: 1.6 mg/dL — ABNORMAL LOW (ref 1.7–2.4)

## 2019-08-04 LAB — URINE CULTURE: Culture: NO GROWTH

## 2019-08-04 LAB — RPR: RPR Ser Ql: NONREACTIVE

## 2019-08-04 MED ORDER — HYDRALAZINE HCL 50 MG PO TABS: 100.0000 mg | ORAL_TABLET | Freq: Three times a day (TID) | ORAL | Status: DC

## 2019-08-04 MED ORDER — LORAZEPAM 2 MG/ML IJ SOLN
1.0000 mg | Freq: Once | INTRAMUSCULAR | Status: AC
  Administered 2019-08-04: 1 mg via INTRAVENOUS
  Filled 2019-08-04: qty 1

## 2019-08-04 MED ORDER — MAGNESIUM SULFATE 2 GM/50ML IV SOLN
2.0000 g | Freq: Once | INTRAVENOUS | Status: AC
  Administered 2019-08-04: 2 g via INTRAVENOUS
  Filled 2019-08-04: qty 50

## 2019-08-04 MED ORDER — LORAZEPAM 2 MG/ML IJ SOLN
0.5000 mg | Freq: Four times a day (QID) | INTRAMUSCULAR | Status: DC | PRN
  Administered 2019-08-04 – 2019-08-05 (×5): 0.5 mg via INTRAVENOUS
  Filled 2019-08-04 (×5): qty 1

## 2019-08-04 MED ORDER — CALCIUM CARBONATE ANTACID 500 MG PO CHEW
1.0000 | CHEWABLE_TABLET | Freq: Two times a day (BID) | ORAL | Status: DC
  Administered 2019-08-04 – 2019-08-08 (×5): 200 mg via ORAL
  Filled 2019-08-04 (×7): qty 1

## 2019-08-04 MED ORDER — HYDRALAZINE HCL 50 MG PO TABS
50.0000 mg | ORAL_TABLET | Freq: Three times a day (TID) | ORAL | Status: DC
  Administered 2019-08-04 – 2019-08-08 (×9): 50 mg via ORAL
  Filled 2019-08-04 (×11): qty 1

## 2019-08-04 NOTE — Progress Notes (Signed)
PROGRESS NOTE    Maame Dack  ZYS:[id number] DOB: 06-28-1980 DOA: 08/03/2019 PCP: Patient, No Pcp Per    Brief Narrative:  39 year old female with history of type 2 diabetes on insulin, hypertension, chronic kidney disease stage IV asthma and marijuana use admitted to the hospital with 4 days of intractable nausea and unable to eat.  In the emergency room, tachycardic.  Blood pressure 200/108.  Intractable vomiting.  Elevated white cell count.  Creatinine 3.18.  Blood sugars 219 and anion gap 18.  CT scan with thickened lower esophagus. Admit to the hospital with dehydration and intractable nausea vomiting.   Assessment & Plan:   Principal Problem:   Intractable nausea and vomiting Active Problems:   Type 2 diabetes mellitus (HCC)   Depression   ARF (acute renal failure) (HCC)   Uncontrolled type 2 diabetes mellitus with hyperglycemia (HCC)  Intractable nausea and vomiting: Probably underlying history of GERD and aggravated by uncontrolled diabetes or marijuana use. Continues to have symptoms.  On clears, unable to tolerate yet. Keep on clear liquid diet, encourage mobility.  Not ready to advance diet. Adequately hydrated today, will keep on maintenance IV fluids. Continue to use adequate nausea medications.  Pepcid IV.  Will increase dose of Protonix on discharge. If persistent symptoms, will discuss with gastroenterology for inpatient procedure.  If symptoms improve, will send for outpatient follow-up.  She will ultimately need an upper GI endoscopy.  Uncontrolled type 2 diabetes with hyperglycemia: Her blood sugars well controlled today.   No evidence of DKA. Even though she has poor appetite, able to control her blood sugars on every 4 hours sliding scale insulin.  Until she is eating, will not restart long-acting insulin. A1c 7.9, previous A1c more than 12.  Patient is taking insulin at home.  Metformin may not be a good choice because of upper GI symptoms.  Will  discontinue.  Hypertension: Accelerated.  Resume amlodipine and hydralazine. Keep on as needed beta-blockers to control her blood pressures.  Hopefully with symptom control and resuming oral medications, her blood pressure will be controlled.  Acute renal failure with history of underlying chronic kidney disease stage IV: At least 6 months ago her creatinine is 2.7.  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  Aggressive hydration and monitoring levels. Resolving.   DVT prophylaxis: enoxaparin (LOVENOX) injection 30 mg Start: 08/03/19 2000   Code Status: Full code Family Communication: None at bedside Disposition Plan: Status is: Observation  The patient will require care spanning > 2 midnights and should be moved to inpatient because: Persistent severe electrolyte disturbances, IV treatments appropriate due to intensity of illness or inability to take PO and Inpatient level of care appropriate due to severity of illness  Patient is not tolerating clear liquid diet and advancement of diet.  She is a still needing around-the-clock intravenous nausea medications.  Due to severity of symptoms, we will keep her in the hospital until clinical improvement.  Will require more than 2 midnights hospital stay.  Dispo: The patient is from: Home              Anticipated d/c is to: Home              Anticipated d/c date is: 2 days              Patient currently is not medically stable to d/c.         Consultants:   None  Procedures:   None  Antimicrobials:   None  Subjective: Patient seen and examined.  Still with dry heaving.  She is not able to eat any food.  Only able to take sips of cold water.  Denies any pain.  Epigastric burning sensation has improved, however nausea has not improved.  Objective: Vitals:   08/04/19 0021 08/04/19 0414 08/04/19 0830 08/04/19 0830  BP: (!) 142/83 (!) 190/98 (!) 180/101 (!) 180/101  Pulse: 94 98 99 99  Resp: 16 16 18 18   Temp: 98.9 F  (37.2 C) 98.7 F (37.1 C) 98.7 F (37.1 C) 98.7 F (37.1 C)  TempSrc: Oral Oral Oral Oral  SpO2: 100% 100% 100% 100%  Weight:      Height:        Intake/Output Summary (Last 24 hours) at 08/04/2019 1145 Last data filed at 08/04/2019 0225 Gross per 24 hour  Intake 2450 ml  Output --  Net 2450 ml   Filed Weights   08/03/19 2021  Weight: 90.1 kg    Examination:  General exam: Appears calm and comfortable, not in any distress.  Anxious. Respiratory system: Clear to auscultation. Respiratory effort normal. Cardiovascular system: S1 & S2 heard, RRR.  Gastrointestinal system: Soft nontender.  Bowel sounds present. Central nervous system: Alert and oriented. No focal neurological deficits. Psychiatry: Judgement and insight appear normal. Mood & affect anxious and flat.    Data Reviewed: I have personally reviewed following labs and imaging studies  CBC: Recent Labs  Lab 08/02/19 0806 08/03/19 1344 08/04/19 0757  WBC 7.5 14.3* 11.8*  NEUTROABS  --  12.9*  --   HGB 9.7* 9.0* 9.5*  HCT 31.7* 30.8* 31.2*  MCV 76.6* 78.0* 75.5*  PLT 373 391 211   Basic Metabolic Panel: Recent Labs  Lab 08/02/19 0806 08/03/19 1344 08/04/19 0757  NA 135 136 139  K 4.9 4.8 4.0  CL 108 105 111  CO2 16* 13* 15*  GLUCOSE 176* 219* 185*  BUN 24* 32* 32*  CREATININE 2.70* 3.18* 2.82*  CALCIUM 9.3 9.2 8.6*  MG  --   --  1.6*  PHOS  --   --  3.3   GFR: Estimated Creatinine Clearance: 30.3 mL/min (A) (by C-G formula based on SCr of 2.82 mg/dL (H)). Liver Function Tests: Recent Labs  Lab 08/02/19 0806 08/03/19 1344 08/04/19 0757  AST 16 26 26   ALT 13 17 17   ALKPHOS 74 69 76  BILITOT 0.8 0.6 0.6  PROT 8.0 7.9 7.3  ALBUMIN 3.2* 3.2* 2.9*   Recent Labs  Lab 08/02/19 0806 08/03/19 1344  LIPASE 32 27   No results for input(s): AMMONIA in the last 168 hours. Coagulation Profile: No results for input(s): INR, PROTIME in the last 168 hours. Cardiac Enzymes: No results for  input(s): CKTOTAL, CKMB, CKMBINDEX, TROPONINI in the last 168 hours. BNP (last 3 results) No results for input(s): PROBNP in the last 8760 hours. HbA1C: Recent Labs    08/03/19 1815  HGBA1C 7.9*   CBG: Recent Labs  Lab 08/02/19 1133 08/03/19 2007 08/04/19 0024 08/04/19 0418 08/04/19 0730  GLUCAP 127* 160* 153* 184* 169*   Lipid Profile: No results for input(s): CHOL, HDL, LDLCALC, TRIG, CHOLHDL, LDLDIRECT in the last 72 hours. Thyroid Function Tests: No results for input(s): TSH, T4TOTAL, FREET4, T3FREE, THYROIDAB in the last 72 hours. Anemia Panel: No results for input(s): VITAMINB12, FOLATE, FERRITIN, TIBC, IRON, RETICCTPCT in the last 72 hours. Sepsis Labs: Recent Labs  Lab 08/03/19 1425 08/03/19 1811  LATICACIDVEN 1.3 1.0    Recent Results (from  the past 240 hour(s))  SARS Coronavirus 2 by RT PCR (hospital order, performed in [facility] hospital lab) Nasopharyngeal Nasopharyngeal Swab     Status: None   Collection Time: 08/03/19  3:15 PM   Specimen: Nasopharyngeal Swab  Result Value Ref Range Status   SARS Coronavirus 2 NEGATIVE NEGATIVE Final    Comment: (NOTE) SARS-CoV-2 target nucleic acids are NOT DETECTED.  The SARS-CoV-2 RNA is generally detectable in upper and lower respiratory specimens during the acute phase of infection. The lowest concentration of SARS-CoV-2 viral copies this assay can detect is 250 copies / mL. A negative result does not preclude SARS-CoV-2 infection and should not be used as the sole basis for treatment or other patient management decisions.  A negative result may occur with improper specimen collection / handling, submission of specimen other than nasopharyngeal swab, presence of viral mutation(s) within the areas targeted by this assay, and inadequate number of viral copies (<250 copies / mL). A negative result must be combined with clinical observations, patient history, and epidemiological information.  Fact Sheet for  Patients:   StrictlyIdeas.no  Fact Sheet for Healthcare Providers: BankingDealers.co.za  This test is not yet approved or  cleared by the [place] FDA and has been authorized for detection and/or diagnosis of SARS-CoV-2 by FDA under an Emergency Use Authorization (EUA).  This EUA will remain in effect (meaning this test can be used) for the duration of the COVID-19 declaration under Section 564(b)(1) of the Act, 21 U.S.C. section 360bbb-3(b)(1), unless the authorization is terminated or revoked sooner.  Performed at [facility], [address], [place], [place] [address]          Radiology Studies: CT ABDOMEN PELVIS WO CONTRAST  Result Date: 08/03/2019 CLINICAL DATA:  Epigastric pain and nausea and vomiting beginning yesterday. EXAM: CT ABDOMEN AND PELVIS WITHOUT CONTRAST TECHNIQUE: Multidetector CT imaging of the abdomen and pelvis was performed following the standard protocol without IV contrast. COMPARISON:  08/11/2018 FINDINGS: Lower chest: No acute findings. Hepatobiliary: No mass visualized on this unenhanced exam. Gallbladder is unremarkable. No evidence of biliary ductal dilatation. Pancreas: No mass or inflammatory process visualized on this unenhanced exam. Spleen:  Within normal limits in size. Adrenals/Urinary tract: No evidence of urolithiasis or hydronephrosis. Unremarkable unopacified urinary bladder. Stomach/Bowel: No evidence of hiatal hernia, however or wall thickening is seen involving the visualized portion of the distal esophagus, suspicious for esophagitis. No evidence of obstruction, inflammatory process, or abnormal fluid collections. Normal appendix visualized. Vascular/Lymphatic: No pathologically enlarged lymph nodes identified. No evidence of abdominal aortic aneurysm. Reproductive:  No mass or other significant abnormality. Other:  None. Musculoskeletal:  No suspicious bone lesions identified. IMPRESSION:  Wall thickening involving the visualized portion of the distal esophagus, suspicious for esophagitis. Consider upper endoscopy for further evaluation. No other significant abnormality identified. Electronically Signed   By: Marlaine Hind M.D.   On: 08/03/2019 17:16        Scheduled Meds: . alum & mag hydroxide-simeth  30 mL Oral Once  . amLODipine  10 mg Oral Daily  . enoxaparin (LOVENOX) injection  30 mg Subcutaneous Q24H  . hydrALAZINE  50 mg Oral TID  . insulin aspart  0-15 Units Subcutaneous Q4H   Continuous Infusions: . sodium chloride 125 mL/hr at 08/03/19 2009  . famotidine (PEPCID) IV 20 mg (08/03/19 2114)  . metronidazole       LOS: 0 days    Time spent: 30 minutes    Barb Merino, MD  [facility] Pager [phone]

## 2019-08-05 DIAGNOSIS — R112 Nausea with vomiting, unspecified: Secondary | ICD-10-CM

## 2019-08-05 LAB — BASIC METABOLIC PANEL
Anion gap: 8 (ref 5–15)
BUN: 37 mg/dL — ABNORMAL HIGH (ref 6–20)
CO2: 16 mmol/L — ABNORMAL LOW (ref 22–32)
Calcium: 8.4 mg/dL — ABNORMAL LOW (ref 8.9–10.3)
Chloride: 112 mmol/L — ABNORMAL HIGH (ref 98–111)
Creatinine, Ser: 2.75 mg/dL — ABNORMAL HIGH (ref 0.44–1.00)
GFR calc Af Amer: 24 mL/min — ABNORMAL LOW (ref >60)
GFR calc non Af Amer: 21 mL/min — ABNORMAL LOW (ref >60)
Glucose, Bld: 143 mg/dL — ABNORMAL HIGH (ref 70–99)
Potassium: 3.6 mmol/L (ref 3.5–5.1)
Sodium: 136 mmol/L (ref 135–145)

## 2019-08-05 LAB — MAGNESIUM: Magnesium: 2.1 mg/dL (ref 1.7–2.4)

## 2019-08-05 LAB — GLUCOSE, CAPILLARY
Glucose-Capillary: 125 mg/dL — ABNORMAL HIGH (ref 70–99)
Glucose-Capillary: 145 mg/dL — ABNORMAL HIGH (ref 70–99)
Glucose-Capillary: 198 mg/dL — ABNORMAL HIGH (ref 70–99)
Glucose-Capillary: 152 mg/dL — ABNORMAL HIGH (ref 70–99)
Glucose-Capillary: 200 mg/dL — ABNORMAL HIGH (ref 70–99)

## 2019-08-05 MED ORDER — STERILE WATER FOR INJECTION IV SOLN
INTRAVENOUS | Status: DC
  Filled 2019-08-05 (×3): qty 850

## 2019-08-05 MED ORDER — ENOXAPARIN SODIUM 40 MG/0.4ML ~~LOC~~ SOLN
40.0000 mg | SUBCUTANEOUS | Status: DC
  Administered 2019-08-05 – 2019-08-07 (×3): 40 mg via SUBCUTANEOUS
  Filled 2019-08-05 (×3): qty 0.4

## 2019-08-05 MED ORDER — MELATONIN 3 MG PO TABS
3.0000 mg | ORAL_TABLET | Freq: Every day | ORAL | Status: DC
  Administered 2019-08-05 – 2019-08-07 (×3): 3 mg via ORAL
  Filled 2019-08-05 (×3): qty 1

## 2019-08-05 MED ORDER — METOCLOPRAMIDE HCL 5 MG/ML IJ SOLN
10.0000 mg | Freq: Three times a day (TID) | INTRAMUSCULAR | Status: DC | PRN
  Administered 2019-08-05 – 2019-08-06 (×3): 10 mg via INTRAVENOUS
  Filled 2019-08-05 (×3): qty 2

## 2019-08-05 MED ORDER — PANTOPRAZOLE SODIUM 40 MG IV SOLR
40.0000 mg | Freq: Two times a day (BID) | INTRAVENOUS | Status: DC
  Administered 2019-08-05 – 2019-08-08 (×6): 40 mg via INTRAVENOUS
  Filled 2019-08-05 (×7): qty 40

## 2019-08-05 NOTE — Progress Notes (Signed)
PROGRESS NOTE  [name]  DOB: [birth year]  PCP: Patient, No Pcp Per EPP:[id number]  DOA: 08/03/2019  LOS: 1 day   Chief Complaint  Patient presents with  . Abdominal Pain  . Emesis   Brief narrative: 39 year old female with history of type 2 diabetes on insulin, hypertension, chronic kidney disease stage IV asthma and marijuana use. Patient presented to the ED on 7/16 with 4 days of intractable nausea and unable to eat.  In the emergency room, tachycardic.  Blood pressure 200/108.  She had Intractable vomiting.  Labs showed elevated white cell count.  Creatinine 3.18.  Blood sugars 219 and anion gap 18.   CT scan with thickened lower esophagus.  Patient was admitted to the hospital with dehydration and intractable nausea and vomiting.  Subjective: Patient was seen and examined this morning.  Pleasant, young African-American female.  Remains nauseated.  Threw up this morning.  Assessment/Plan: Intractable nausea and vomiting:  -Probably underlying history of GERD and aggravated by uncontrolled diabetes or marijuana use. -Continues to have symptoms.  On clears, unable to tolerate yet. -Continue clear liquid diet, encourage mobility.  Not ready to advance diet. -Continue maintenance fluid.  Switch to bicarbonate drip because of coexisting low serum bicarbonate level. -Start on Protonix IV.  Switch from IV Zofran to IV Reglan. -If persistent symptoms, will discuss with gastroenterology for inpatient procedure. If symptoms improve, will send for outpatient follow-up. She will ultimately need an upper GI endoscopy.  Poorly controlled type 2 diabetes with hyperglycemia:  A1c 7.9 Her blood sugars well controlled today.   No evidence of DKA. Even though she has poor appetite, able to control her blood sugars on every 4 hours sliding scale insulin.  Until she is eating, will not restart long-acting insulin. A1c 7.9, previous A1c more than 12.  Patient takes insulin at home.  Metformin  stopped.  Hypertension:  -Continue amlodipine and hydralazine. Keep on as needed beta-blockers to control her blood pressures. Hopefully with symptom control and resuming oral medications, her blood pressure will be controlled.  AKI on CKD 4 Metabolic acidosis -At least 6 months ago her creatinine is 2.7. 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 Aggressive hydration and monitoring levels. -IV fluids switched to sodium bicarbonate drip today.  Continue to monitor creatinine and serum bicarb level.  Mobility: Encourage ambulation Code Status:   Code Status: Full Code  Nutritional status: Body mass index is 31.11 kg/m.     Diet Order            Diet clear liquid Room service appropriate? Yes; Fluid consistency: Thin  Diet effective now                 DVT prophylaxis: enoxaparin (LOVENOX) injection 40 mg Start: 08/05/19 2000   Antimicrobials:  None Fluid: Sodium bicarbonate at 100 mill per hour  Consultants: None Family Communication:  None at bedside  Status is: Inpatient  Remains inpatient appropriate because:Persistent severe electrolyte disturbances, Ongoing diagnostic testing needed not appropriate for outpatient work up, Unsafe d/c plan and IV treatments appropriate due to intensity of illness or inability to take PO   Dispo:  Patient From: Home  Planned Disposition: Home  Expected discharge date: 08/05/19  Medically stable for discharge: No   Infusions:  . metronidazole    .  sodium bicarbonate (isotonic) infusion in sterile water 100 mL/hr at 08/05/19 1018    Scheduled Meds: . amLODipine  10 mg Oral Daily  . calcium carbonate  1 tablet Oral BID  .  enoxaparin (LOVENOX) injection  40 mg Subcutaneous Q24H  . hydrALAZINE  50 mg Oral TID  . insulin aspart  0-15 Units Subcutaneous Q4H  . pantoprazole (PROTONIX) IV  40 mg Intravenous Q12H    Antimicrobials: Anti-infectives (From admission, onward)   Start     Dose/Rate Route Frequency Ordered  Stop   08/03/19 1745  metroNIDAZOLE (FLAGYL) IVPB 2,000 mg 400 mL     Discontinue     2,000 mg 400 mL/hr over 60 Minutes Intravenous  Once 08/03/19 1730        PRN meds: acetaminophen **OR** acetaminophen, albuterol, metoCLOPramide (REGLAN) injection, metoprolol tartrate   Objective: Vitals:   08/05/19 0926 08/05/19 0937  BP: (!) 174/94 (!) 165/93  Pulse: (!) 107 (!) 101  Resp:  14  Temp:  98.3 F (36.8 C)  SpO2: 100% 100%    Intake/Output Summary (Last 24 hours) at 08/05/2019 1531 Last data filed at 08/05/2019 0927 Gross per 24 hour  Intake 480 ml  Output --  Net 480 ml   Filed Weights   08/03/19 2021  Weight: 90.1 kg   Weight change:  Body mass index is 31.11 kg/m.   Physical Exam: General exam: Appears calm and comfortable.  In distress because of nausea Skin: No rashes, lesions or ulcers. HEENT: Atraumatic, normocephalic, supple neck, no obvious bleeding Lungs: Clear to auscultation bilaterally CVS: Regular rate and rhythm, no murmur GI/Abd soft, nontender, nondistended, bowel sound present CNS: Alert, awake, oriented x3 Psychiatry: Mood appropriate Extremities: No pedal edema, no calf tenderness  Data Review: I have personally reviewed the laboratory data and studies available.  Recent Labs  Lab 08/02/19 0806 08/03/19 1344 08/04/19 0757  WBC 7.5 14.3* 11.8*  NEUTROABS  --  12.9*  --   HGB 9.7* 9.0* 9.5*  HCT 31.7* 30.8* 31.2*  MCV 76.6* 78.0* 75.5*  PLT 373 391 400   Recent Labs  Lab 08/02/19 0806 08/03/19 1344 08/04/19 0757 08/05/19 0256  NA 135 136 139 136  K 4.9 4.8 4.0 3.6  CL 108 105 111 112*  CO2 16* 13* 15* 16*  GLUCOSE 176* 219* 185* 143*  BUN 24* 32* 32* 37*  CREATININE 2.70* 3.18* 2.82* 2.75*  CALCIUM 9.3 9.2 8.6* 8.4*  MG  --   --  1.6* 2.1  PHOS  --   --  3.3  --     Signed, Terrilee Croak, MD [facility] Pager: [phone] (Secure Chat preferred). 08/05/2019

## 2019-08-06 ENCOUNTER — Encounter (HOSPITAL_COMMUNITY): Payer: Self-pay | Admitting: Internal Medicine

## 2019-08-06 DIAGNOSIS — R935 Abnormal findings on diagnostic imaging of other abdominal regions, including retroperitoneum: Secondary | ICD-10-CM

## 2019-08-06 LAB — GLUCOSE, CAPILLARY
Glucose-Capillary: 100 mg/dL — ABNORMAL HIGH (ref 70–99)
Glucose-Capillary: 116 mg/dL — ABNORMAL HIGH (ref 70–99)
Glucose-Capillary: 121 mg/dL — ABNORMAL HIGH (ref 70–99)
Glucose-Capillary: 138 mg/dL — ABNORMAL HIGH (ref 70–99)
Glucose-Capillary: 219 mg/dL — ABNORMAL HIGH (ref 70–99)
Glucose-Capillary: 84 mg/dL (ref 70–99)

## 2019-08-06 LAB — BASIC METABOLIC PANEL
Anion gap: 8 (ref 5–15)
BUN: 33 mg/dL — ABNORMAL HIGH (ref 6–20)
CO2: 22 mmol/L (ref 22–32)
Calcium: 8.1 mg/dL — ABNORMAL LOW (ref 8.9–10.3)
Chloride: 107 mmol/L (ref 98–111)
Creatinine, Ser: 3.05 mg/dL — ABNORMAL HIGH (ref 0.44–1.00)
GFR calc Af Amer: 21 mL/min — ABNORMAL LOW (ref >60)
GFR calc non Af Amer: 18 mL/min — ABNORMAL LOW (ref >60)
Glucose, Bld: 143 mg/dL — ABNORMAL HIGH (ref 70–99)
Potassium: 3.2 mmol/L — ABNORMAL LOW (ref 3.5–5.1)
Sodium: 137 mmol/L (ref 135–145)

## 2019-08-06 MED ORDER — ONDANSETRON HCL 4 MG/2ML IJ SOLN: 4.0000 mg | Freq: Four times a day (QID) | INTRAMUSCULAR | Status: DC | PRN

## 2019-08-06 MED ORDER — INSULIN GLARGINE 100 UNIT/ML ~~LOC~~ SOLN
10.0000 [IU] | Freq: Every day | SUBCUTANEOUS | Status: DC
  Administered 2019-08-06 – 2019-08-08 (×3): 10 [IU] via SUBCUTANEOUS
  Filled 2019-08-06 (×3): qty 0.1

## 2019-08-06 MED ORDER — POTASSIUM CHLORIDE CRYS ER 20 MEQ PO TBCR
40.0000 meq | EXTENDED_RELEASE_TABLET | Freq: Once | ORAL | Status: DC
  Filled 2019-08-06: qty 2

## 2019-08-06 MED ORDER — LORAZEPAM 2 MG/ML IJ SOLN
0.5000 mg | INTRAMUSCULAR | Status: DC | PRN
  Administered 2019-08-06 – 2019-08-08 (×7): 0.5 mg via INTRAVENOUS
  Filled 2019-08-06 (×7): qty 1

## 2019-08-06 MED ORDER — SODIUM CHLORIDE 0.9 % IV SOLN: INTRAVENOUS | Status: DC

## 2019-08-06 MED ORDER — METOCLOPRAMIDE HCL 5 MG/ML IJ SOLN
10.0000 mg | Freq: Three times a day (TID) | INTRAMUSCULAR | Status: DC
  Administered 2019-08-06 – 2019-08-08 (×7): 10 mg via INTRAVENOUS
  Filled 2019-08-06 (×7): qty 2

## 2019-08-06 NOTE — Progress Notes (Addendum)
   08/05/19 1911  Assess: MEWS Score  ECG Heart Rate (!) 124  Assess: MEWS Score  MEWS Temp 0  MEWS Systolic 0  MEWS Pulse 2  MEWS RR 0  MEWS LOC 0  MEWS Score 2  MEWS Score Color Yellow  Assess: if the MEWS score is Yellow or Red  Were vital signs taken at a resting state? Yes  Focused Assessment Documented focused assessment  Early Detection of Sepsis Score *See Row Information* Low  MEWS guidelines implemented *See Row Information* Yes  Treat  MEWS Interventions Administered prn meds/treatments;Escalated (See documentation below)  Take Vital Signs  Increase Vital Sign Frequency  Yellow: Q 2hr X 2 then Q 4hr X 2, if remains yellow, continue Q 4hrs  Escalate  MEWS: Escalate Yellow: discuss with charge nurse/RN and consider discussing with provider and RRT  Notify: Charge Nurse/RN  Name of Charge Nurse/RN Notified Yrneh, RN  Date Charge Nurse/RN Notified 08/05/19  Time Charge Nurse/RN Notified 2000  Notify: Provider  Provider Name/Title Kennon Holter, MD  Date Provider Notified 08/05/19  Time Provider Notified 2003  Notification Type Page  Notification Reason Other (Comment) (Tachycardia)  Response No new orders  Date of Provider Response  (n/a)  Time of Provider Response  (n/a)  Notify: Rapid Response  Name of Rapid Response RN Notified n/a  Document  Patient Outcome Stabilized after interventions  Progress note created (see row info) Yes

## 2019-08-06 NOTE — Progress Notes (Signed)
PROGRESS NOTE  [name]  DOB: 17-May-1980  PCP: Patient, No Pcp Per [id number]  DOA: 08/03/2019  LOS: 2 days   Chief Complaint  Patient presents with  . Abdominal Pain  . Emesis   Brief narrative: 39 year old female with history of type 2 diabetes on insulin, hypertension, chronic kidney disease stage IV asthma and marijuana use. Patient presented to the ED on 7/16 with 4 days of intractable nausea and unable to eat.  In the emergency room, tachycardic.  Blood pressure 200/108.  She had Intractable vomiting.  Labs showed elevated white cell count.  Creatinine 3.18.  Blood sugars 219 and anion gap 18.   CT scan with thickened lower esophagus.  Patient was admitted to the hospital with dehydration and intractable nausea and vomiting.  Subjective: Patient was seen and examined this morning.   Patient is tearful this morning.  She says she had a panic attack.  She says she had to wait 6 hours to get clean.  She feels ignored.  Nausea improving, no vomiting last 24 hours.  Wants to try regular food today.    Assessment/Plan: Intractable nausea and vomiting:  -Probably underlying history of GERD and aggravated by uncontrolled diabetes or marijuana use. -Currently nausea seems to have improved on Reglan.  Patient wants to try regular food. -Encourage mobility. -Continue maintenance IV fluid for now. -Continue Protonix. -If persistent symptoms, will discuss with gastroenterology for inpatient procedure. If symptoms improve, will send for outpatient follow-up. She will ultimately need an upper GI endoscopy.  Poorly controlled type 2 diabetes with hyperglycemia:  A1c 7.9 -Blood sugar level trending up, 219 this morning. -No evidence of DKA. -At home, patient was on Lantus 40 units at bedtime, Premeal sliding scale insulin as well as Metformin. -Definitely stop Metformin with significantly elevated creatinine. -Currently on sliding scale insulin only.  I would start her on Lantus  10 units from this morning. -Continue sliding scale 0 with Accu-Cheks.  Hypertension:  -Continue amlodipine and hydralazine.  -Blood pressure controlled mostly normal range.  AKI on CKD 4 Metabolic acidosis -Baseline creatinine about 2.7.  Presented with elevated creatinine of 3.18.  Also with serum bicarb level low between 13-16.  -Currently getting IV hydration with sodium bicarbonate drip.  Bicarbonate level normalized this morning but creatinine level is further up to 3.05.   -Switch IV fluid to normal saline at 100 mL/h.  Continue to monitor creatinine.    Hypokalemia  -Potassium level low at 3.2 this morning.  Oral 40 mEq of potassium ordered.    Anxiety disorder -I do not see any anxiety meds in her list of home medicines.  Mobility: Encourage ambulation Code Status:   Code Status: Full Code  Nutritional status: Body mass index is 31.11 kg/m.     Diet Order            Diet regular Room service appropriate? Yes; Fluid consistency: Thin  Diet effective now                 DVT prophylaxis: enoxaparin (LOVENOX) injection 40 mg Start: 08/05/19 2000   Antimicrobials:  None Fluid: Normal saline at 100 mill per hour  Consultants: None Family Communication:  None at bedside  Status is: Inpatient  Remains inpatient appropriate because:Persistent severe electrolyte disturbances, Ongoing diagnostic testing needed not appropriate for outpatient work up, Unsafe d/c plan and IV treatments appropriate due to intensity of illness or inability to take PO   Dispo:  Patient From: Home  Planned Disposition: Home  Expected discharge date: This afternoon versus tomorrow based on poor tolerance.  Medically stable for discharge: No   Infusions:  . sodium chloride Stopped (08/06/19 0904)  . metronidazole      Scheduled Meds: . amLODipine  10 mg Oral Daily  . calcium carbonate  1 tablet Oral BID  . enoxaparin (LOVENOX) injection  40 mg Subcutaneous Q24H  . hydrALAZINE   50 mg Oral TID  . insulin aspart  0-15 Units Subcutaneous Q4H  . insulin glargine  10 Units Subcutaneous Daily  . melatonin  3 mg Oral QHS  . pantoprazole (PROTONIX) IV  40 mg Intravenous Q12H  . potassium chloride  40 mEq Oral Once    Antimicrobials: Anti-infectives (From admission, onward)   Start     Dose/Rate Route Frequency Ordered Stop   08/03/19 1745  metroNIDAZOLE (FLAGYL) IVPB 2,000 mg 400 mL     Discontinue     2,000 mg 400 mL/hr over 60 Minutes Intravenous  Once 08/03/19 1730        PRN meds: acetaminophen **OR** acetaminophen, albuterol, metoCLOPramide (REGLAN) injection, metoprolol tartrate   Objective: Vitals:   08/06/19 0344 08/06/19 0755  BP: 133/74 (!) 154/109  Pulse: 98 (!) 107  Resp: 16 17  Temp: 98.5 F (36.9 C) 98.8 F (37.1 C)  SpO2: 100% 100%    Intake/Output Summary (Last 24 hours) at 08/06/2019 1145 Last data filed at 08/06/2019 0300 Gross per 24 hour  Intake 2136 ml  Output --  Net 2136 ml   Filed Weights   08/03/19 2021  Weight: 90.1 kg   Weight change:  Body mass index is 31.11 kg/m.   Physical Exam: General exam: Appears calm and comfortable.  Not in physical distress skin: No rashes, lesions or ulcers. HEENT: Atraumatic, normocephalic, supple neck, no obvious bleeding Lungs: Clear to auscultation bilaterally CVS: Regular rate and rhythm, no murmur GI/Abd soft, nontender, nondistended, bowel sound present CNS: Alert, awake, oriented x3 Psychiatry: Tearful, anxious Extremities: No pedal edema, no calf tenderness  Data Review: I have personally reviewed the laboratory data and studies available.  Recent Labs  Lab 08/02/19 0806 08/03/19 1344 08/04/19 0757  WBC 7.5 14.3* 11.8*  NEUTROABS  --  12.9*  --   HGB 9.7* 9.0* 9.5*  HCT 31.7* 30.8* 31.2*  MCV 76.6* 78.0* 75.5*  PLT 373 391 400   Recent Labs  Lab 08/02/19 0806 08/03/19 1344 08/04/19 0757 08/05/19 0256 08/06/19 0218  NA 135 136 139 136 137  K 4.9 4.8 4.0 3.6  3.2*  CL 108 105 111 112* 107  CO2 16* 13* 15* 16* 22  GLUCOSE 176* 219* 185* 143* 143*  BUN 24* 32* 32* 37* 33*  CREATININE 2.70* 3.18* 2.82* 2.75* 3.05*  CALCIUM 9.3 9.2 8.6* 8.4* 8.1*  MG  --   --  1.6* 2.1  --   PHOS  --   --  3.3  --   --     Signed, Terrilee Croak, MD [facility] Pager: [phone] (Secure Chat preferred). 08/06/2019

## 2019-08-06 NOTE — Plan of Care (Signed)

## 2019-08-06 NOTE — Plan of Care (Signed)

## 2019-08-06 NOTE — Progress Notes (Signed)
Patient request IV team return, RN to replace consult

## 2019-08-06 NOTE — Progress Notes (Signed)
1950: Yellow MEWs d/t HR of 124 triggered by CCMD   MEWs protocol escalated: vital signs  PRN Lopressor administered at 2014  MD on-call [place] notified  Will continue to monitor   2100 HR stabilized in the 90s after medication

## 2019-08-06 NOTE — Progress Notes (Addendum)
°   08/05/19 1951  Assess: MEWS Score  Temp 99.1 F (37.3 C)  BP 121/63  Pulse Rate (!) 119  Resp 18  Level of Consciousness Alert  SpO2 100 %  O2 Device Room Air  Assess: MEWS Score  MEWS Temp 0  MEWS Systolic 0  MEWS Pulse 2  MEWS RR 0  MEWS LOC 0  MEWS Score 2  MEWS Score Color Yellow

## 2019-08-06 NOTE — Consult Note (Addendum)
Referring Provider:  Dr. Pietro Cassis, [facility] Primary Care Physician:  Patient, No Pcp Per Primary Gastroenterologist:  Althia Forts  Reason for Consultation:  N/V, thickened esophagus on CT scan  HPI: [name] is a 39 y.o. female with history of type 2 diabetes on insulin last Hgb A1C 7.9 last month,hypertension, chronic kidney disease stage IV, asthma, and marijuana use.  She presented to the ED on 7/16 with 4 days of intractable nausea and inability to eat.  In the emergency room she was tachycardic, blood pressure 200/108.  Labs showed elevated white cell count at 14.3 then down to 11.8 the next day.  CT scan of the abdomen and pelvis without contrast showed the following:  IMPRESSION: Wall thickening involving the visualized portion of the distal esophagus, suspicious for esophagitis. Consider upper endoscopy for further evaluation.  No other significant abnormality identified.  She tells me that this came on her suddenly and she was not having any similar symptoms previously.  She denies any complaints of heartburn/reflux/indigestion leading up to this or regularly at home (started having some after having so much nausea and vomiting, however).  She says that every time she drinks something it will not go down and she vomits it back up.  She denies abdominal pain and dysphagia.  She has been started on pantoprazole 40 mg IV BID here but was not on any acid medication at home since she does not have symptoms.  Was placed on reglan prn here.  Also started on ativan prn and she says that helps.  Says that she is having separation anxiety from being away from her son.  She vomited up the Ginger Ale that she was sipping while I was in the room with her.  Says that she only smokes marijuana rarely, last was a few weeks ago.  She had an EGD performed by Dr. Penelope Coop in June 2012 for complaints of vomiting.  That showed a hiatal hernia, erythema in the stomach, bile in the stomach, erosions in the  distal duodenum.  Biopsy showed moderate inactive gastritis without H. pylori.  Duodenal biopsies showed changes consistent with peptic or NSAID induced injury.   Past Medical History:  Diagnosis Date  . Anxiety   . Asthma   . Depression   . Diabetes mellitus   . Hyperemesis arising during pregnancy   . Hypertension   . Mental disorder     Past Surgical History:  Procedure Laterality Date  . NO PAST SURGERIES    . TUBAL LIGATION  06/20/2011   Procedure: POST PARTUM TUBAL LIGATION;  Surgeon: Mora Bellman, MD;  Location: [facility];  Service: Gynecology;  Laterality: Bilateral;  Bilateral post partum tubal ligation    Prior to Admission medications   Medication Sig Start Date End Date Taking? Authorizing Provider  amLODipine (NORVASC) 10 MG tablet Take 1 tablet (10 mg total) by mouth daily. 08/25/18  Yes Thurnell Lose, MD  ferrous sulfate 325 (65 FE) MG tablet Take 1 tablet (325 mg total) by mouth 3 (three) times daily with meals. 02/18/16  Yes Velvet Bathe, MD  insulin aspart (NOVOLOG) 100 UNIT/ML injection Before each meal 3 times a day , 140-199 - 2 units, 200-250 - 4 units, 251-299 - 6 units,  300-349 - 8 units,  350 or above 10 units. Insulin PEN if approved, provide syringes and needles if needed. Can switch to any other cheaper alternative. Patient taking differently: Inject 2-10 Units into the skin See admin instructions. Before each meal 3 times  a day , 140-199 - 2 units, 200-250 - 4 units, 251-299 - 6 units,  300-349 - 8 units,  350 or above 10 units. 08/25/18  Yes Thurnell Lose, MD  insulin glargine (LANTUS) 100 UNIT/ML injection Inject 0.4 mLs (40 Units total) into the skin at bedtime. Dispense insulin pen if approved, if not dispense as needed syringes and needles for 1 month supply. Can switch to Levemir. Diagnosis E 11.65. Patient taking differently: Inject 40 Units into the skin at bedtime.  08/25/18  Yes Thurnell Lose, MD  Insulin Syringe-Needle U-100 25G X 1" 1 ML MISC  For 4 times a day insulin SQ, 1 month supply. Diagnosis E11.65 08/25/18  Yes Thurnell Lose, MD  ondansetron (ZOFRAN ODT) 4 MG disintegrating tablet Take 1 tablet (4 mg total) by mouth every 8 (eight) hours as needed for nausea or vomiting. 08/02/19  Yes Maudie Flakes, MD  acetaminophen (TYLENOL) 325 MG tablet Take 2 tablets (650 mg total) by mouth every 6 (six) hours as needed. Patient not taking: Reported on 08/04/2019 10/21/16   Jacqlyn Larsen, PA-C  benzonatate (TESSALON PERLES) 100 MG capsule Take 1 capsule (100 mg total) by mouth 3 (three) times daily as needed for cough. Patient not taking: Reported on 08/04/2019 02/06/16   Mackuen, Courteney Lyn, MD  hydrALAZINE (APRESOLINE) 50 MG tablet Take 1 tablet (50 mg total) by mouth 3 (three) times daily. 08/25/18 08/03/19  Thurnell Lose, MD  metFORMIN (GLUCOPHAGE) 500 MG tablet Take 1 tablet (500 mg total) by mouth 2 (two) times daily with a meal. Patient not taking: Reported on 08/04/2019 08/11/18   Jacqlyn Larsen, PA-C  methocarbamol (ROBAXIN) 500 MG tablet Take 1 tablet (500 mg total) by mouth 2 (two) times daily. Patient not taking: Reported on 08/04/2019 10/21/16   Jacqlyn Larsen, PA-C  pantoprazole (PROTONIX) 40 MG tablet Take 1 tablet (40 mg total) by mouth daily. Patient not taking: Reported on 08/04/2019 08/03/19   Hall-Potvin, [place], PA-C  promethazine-dextromethorphan (PROMETHAZINE-DM) 6.25-15 MG/5ML syrup Take 5 mLs by mouth 4 (four) times daily as needed for cough. Patient not taking: Reported on 08/04/2019 02/06/17   Tanna Furry, MD  traMADol (ULTRAM) 50 MG tablet Take 1 tablet (50 mg total) by mouth every 6 (six) hours as needed. Patient not taking: Reported on 08/04/2019 02/06/17   Tanna Furry, MD    Current Facility-Administered Medications  Medication Dose Route Frequency Provider Last Rate Last Admin  . 0.9 %  sodium chloride infusion   Intravenous Continuous Dahal, Marlowe Aschoff, MD 100 mL/hr at 08/06/19 1258 New Bag at 08/06/19 1258  .  acetaminophen (TYLENOL) tablet 650 mg  650 mg Oral Q6H PRN Barb Merino, MD       Or  . acetaminophen (TYLENOL) suppository 650 mg  650 mg Rectal Q6H PRN Barb Merino, MD      . albuterol (PROVENTIL) (2.5 MG/3ML) 0.083% nebulizer solution 2.5 mg  2.5 mg Nebulization Q2H PRN Barb Merino, MD      . amLODipine (NORVASC) tablet 10 mg  10 mg Oral Daily Barb Merino, MD   10 mg at 08/06/19 0913  . calcium carbonate (TUMS - dosed in mg elemental calcium) chewable tablet 200 mg of elemental calcium  1 tablet Oral BID Barb Merino, MD   200 mg of elemental calcium at 08/06/19 0913  . enoxaparin (LOVENOX) injection 40 mg  40 mg Subcutaneous Q24H Dahal, Marlowe Aschoff, MD   40 mg at 08/05/19 2014  . hydrALAZINE (APRESOLINE) tablet  50 mg  50 mg Oral TID Barb Merino, MD   50 mg at 08/06/19 0914  . insulin aspart (novoLOG) injection 0-15 Units  0-15 Units Subcutaneous Q4H Barb Merino, MD   2 Units at 08/06/19 1402  . insulin glargine (LANTUS) injection 10 Units  10 Units Subcutaneous Daily Dahal, Marlowe Aschoff, MD   10 Units at 08/06/19 1258  . melatonin tablet 3 mg  3 mg Oral QHS Terrilee Croak, MD   3 mg at 08/05/19 2123  . metoCLOPramide (REGLAN) injection 10 mg  10 mg Intravenous Q8H PRN Terrilee Croak, MD   10 mg at 08/06/19 1239  . metoprolol tartrate (LOPRESSOR) injection 5 mg  5 mg Intravenous Q6H PRN Lovey Newcomer T, NP   5 mg at 08/05/19 2014  . metroNIDAZOLE (FLAGYL) IVPB 2,000 mg 400 mL  2,000 mg Intravenous Once Barb Merino, MD      . pantoprazole (PROTONIX) injection 40 mg  40 mg Intravenous Q12H Dahal, Marlowe Aschoff, MD   40 mg at 08/06/19 0904  . potassium chloride SA (KLOR-CON) CR tablet 40 mEq  40 mEq Oral Once Terrilee Croak, MD        Allergies as of 08/03/2019 - Review Complete 08/03/2019  Allergen Reaction Noted  . Shellfish allergy Anaphylaxis and Swelling 04/05/2012  . Morphine and related Nausea And Vomiting 08/03/2010    Family History  Problem Relation Age of Onset  . Heart disease  Mother        CHF  . Diabetes Sister   . Anesthesia problems Neg Hx     Social History   Socioeconomic History  . Marital status: Single    Spouse name: Not on file  . Number of children: Not on file  . Years of education: Not on file  . Highest education level: Not on file  Occupational History  . Not on file  Tobacco Use  . Smoking status: Never Smoker  . Smokeless tobacco: Never Used  Substance and Sexual Activity  . Alcohol use: No    Comment: occassionally  . Drug use: No    Types: Cocaine, Marijuana  . Sexual activity: Yes    Birth control/protection: None  Other Topics Concern  . Not on file  Social History Narrative  . Not on file   Social Determinants of Health   Financial Resource Strain:   . Difficulty of Paying Living Expenses:   Food Insecurity:   . Worried About Charity fundraiser in the Last Year:   . Arboriculturist in the Last Year:   Transportation Needs:   . Film/video editor (Medical):   Marland Kitchen Lack of Transportation (Non-Medical):   Physical Activity:   . Days of Exercise per Week:   . Minutes of Exercise per Session:   Stress:   . Feeling of Stress :   Social Connections:   . Frequency of Communication with Friends and Family:   . Frequency of Social Gatherings with Friends and Family:   . Attends Religious Services:   . Active Member of Clubs or Organizations:   . Attends Archivist Meetings:   Marland Kitchen Marital Status:   Intimate Partner Violence:   . Fear of Current or Ex-Partner:   . Emotionally Abused:   Marland Kitchen Physically Abused:   . Sexually Abused:     Review of Systems: ROS is O/W negative except as mentioned in HPI.  Physical Exam: Vital signs in last 24 hours: Temp:  [98.4 F (36.9 C)-99.6 F (37.6 C)]  98.8 F (37.1 C) (07/19 0755) Pulse Rate:  [96-119] 107 (07/19 0755) Resp:  [16-18] 17 (07/19 0755) BP: (121-154)/(63-109) 154/109 (07/19 0755) SpO2:  [100 %] 100 % (07/19 0755) Last BM Date: 08/05/19 General:   Alert, Well-developed, well-nourished, pleasant and cooperative in NAD Head:  Normocephalic and atraumatic. Eyes:  Sclera clear, no icterus.  Conjunctiva pink. Ears:  Normal auditory acuity. Mouth:  No deformity or lesions.   Lungs:  Clear throughout to auscultation.  No wheezes, crackles, or rhonchi.  Heart:  Regular rate and rhythm; no murmurs, clicks, rubs, or gallops. Abdomen:  Soft, non-distended.  BS present.  Non-tender. Msk:  Symmetrical without gross deformities. Pulses:  Normal pulses noted. Extremities:  Without clubbing or edema. Neurologic:  Alert and oriented x 4;  grossly normal neurologically. Skin:  Intact without significant lesions or rashes. Psych:  Alert and cooperative. Normal mood and affect.  Intake/Output from previous day: 07/18 0701 - 07/19 0700 In: 2376 [P.O.:840; I.V.:1536] Out: -   Lab Results: Recent Labs    08/04/19 0757  WBC 11.8*  HGB 9.5*  HCT 31.2*  PLT 400   BMET Recent Labs    08/04/19 0757 08/05/19 0256 08/06/19 0218  NA 139 136 137  K 4.0 3.6 3.2*  CL 111 112* 107  CO2 15* 16* 22  GLUCOSE 185* 143* 143*  BUN 32* 37* 33*  CREATININE 2.82* 2.75* 3.05*  CALCIUM 8.6* 8.4* 8.1*   LFT Recent Labs    08/04/19 0757  PROT 7.3  ALBUMIN 2.9*  AST 26  ALT 17  ALKPHOS 76  BILITOT 0.6   IMPRESSION:  *Nausea and vomiting with wall thickening of the distal esophagus seen by CT scan, likely esophagitis.  Is on pantoprazole 40 mg IV twice daily.  Not on PPI at home.  Denies any previous similar symptoms and no complaints of heartburn or reflux at home.  ? Diabetic gastroparesis, last Hgb A1C was 7.9, it has been much worse in the past.  Reports only rare marijuana use. *Hypokalemia: K+ 3.2 today.  From vomiting. *Chronic microcytic anemia  PLAN: -Will schedule Reglan 10 mg IV ACHS. -Will add zofran every 6 hours prn. -Continue pantoprazole 40 mg IV BID. -EGD per Dr. Henrene Pastor.  Laban Emperor. Zehr  08/06/2019, 2:10 PM  GI  ATTENDING  History: Laboratories, x-rays reviewed.  Patient seen and examined.  Agree with comprehensive consultation note as outlined above.  Patient with multiple medical problems including poorly controlled diabetes mellitus, chronic renal insufficiency, and cannabis use who presents with intractable nausea and vomiting.  Abnormal CT scan shows abnormalities of the distal esophagus which are likely esophagitis.  Candidiasis should be excluded.  Previous EGD, remotely, noted.  Agree with plans for diagnostic upper endoscopy to evaluate symptoms and rule out candidiasis versus reflux esophagitis.  Patient is high risk given her comorbidities.The nature of the procedure, as well as the risks, benefits, and alternatives were carefully and thoroughly reviewed with the patient. Ample time for discussion and questions allowed. The patient understood, was satisfied, and agreed to proceed.  Docia Chuck. Geri Seminole., M.D. [facility] Division of Gastroenterology

## 2019-08-06 NOTE — Plan of Care (Signed)

## 2019-08-06 NOTE — H&P (View-Only) (Signed)
Referring Provider:  Dr. Pietro Cassis, [facility] Primary Care Physician:  Patient, No Pcp Per Primary Gastroenterologist:  Althia Forts  Reason for Consultation:  N/V, thickened esophagus on CT scan  HPI: [name] is a 39 y.o. female with history of type 2 diabetes on insulin last Hgb A1C 7.9 last month,hypertension, chronic kidney disease stage IV, asthma, and marijuana use.  She presented to the ED on 7/16 with 4 days of intractable nausea and inability to eat.  In the emergency room she was tachycardic, blood pressure 200/108.  Labs showed elevated white cell count at 14.3 then down to 11.8 the next day.  CT scan of the abdomen and pelvis without contrast showed the following:  IMPRESSION: Wall thickening involving the visualized portion of the distal esophagus, suspicious for esophagitis. Consider upper endoscopy for further evaluation.  No other significant abnormality identified.  She tells me that this came on her suddenly and she was not having any similar symptoms previously.  She denies any complaints of heartburn/reflux/indigestion leading up to this or regularly at home (started having some after having so much nausea and vomiting, however).  She says that every time she drinks something it will not go down and she vomits it back up.  She denies abdominal pain and dysphagia.  She has been started on pantoprazole 40 mg IV BID here but was not on any acid medication at home since she does not have symptoms.  Was placed on reglan prn here.  Also started on ativan prn and she says that helps.  Says that she is having separation anxiety from being away from her son.  She vomited up the Ginger Ale that she was sipping while I was in the room with her.  Says that she only smokes marijuana rarely, last was a few weeks ago.  She had an EGD performed by Dr. Penelope Coop in June 2012 for complaints of vomiting.  That showed a hiatal hernia, erythema in the stomach, bile in the stomach, erosions in the  distal duodenum.  Biopsy showed moderate inactive gastritis without H. pylori.  Duodenal biopsies showed changes consistent with peptic or NSAID induced injury.   Past Medical History:  Diagnosis Date  . Anxiety   . Asthma   . Depression   . Diabetes mellitus   . Hyperemesis arising during pregnancy   . Hypertension   . Mental disorder     Past Surgical History:  Procedure Laterality Date  . NO PAST SURGERIES    . TUBAL LIGATION  06/20/2011   Procedure: POST PARTUM TUBAL LIGATION;  Surgeon: Mora Bellman, MD;  Location: [facility];  Service: Gynecology;  Laterality: Bilateral;  Bilateral post partum tubal ligation    Prior to Admission medications   Medication Sig Start Date End Date Taking? Authorizing Provider  amLODipine (NORVASC) 10 MG tablet Take 1 tablet (10 mg total) by mouth daily. 08/25/18  Yes Thurnell Lose, MD  ferrous sulfate 325 (65 FE) MG tablet Take 1 tablet (325 mg total) by mouth 3 (three) times daily with meals. 02/18/16  Yes Velvet Bathe, MD  insulin aspart (NOVOLOG) 100 UNIT/ML injection Before each meal 3 times a day , 140-199 - 2 units, 200-250 - 4 units, 251-299 - 6 units,  300-349 - 8 units,  350 or above 10 units. Insulin PEN if approved, provide syringes and needles if needed. Can switch to any other cheaper alternative. Patient taking differently: Inject 2-10 Units into the skin See admin instructions. Before each meal 3 times  a day , 140-199 - 2 units, 200-250 - 4 units, 251-299 - 6 units,  300-349 - 8 units,  350 or above 10 units. 08/25/18  Yes Thurnell Lose, MD  insulin glargine (LANTUS) 100 UNIT/ML injection Inject 0.4 mLs (40 Units total) into the skin at bedtime. Dispense insulin pen if approved, if not dispense as needed syringes and needles for 1 month supply. Can switch to Levemir. Diagnosis E 11.65. Patient taking differently: Inject 40 Units into the skin at bedtime.  08/25/18  Yes Thurnell Lose, MD  Insulin Syringe-Needle U-100 25G X 1" 1 ML MISC  For 4 times a day insulin SQ, 1 month supply. Diagnosis E11.65 08/25/18  Yes Thurnell Lose, MD  ondansetron (ZOFRAN ODT) 4 MG disintegrating tablet Take 1 tablet (4 mg total) by mouth every 8 (eight) hours as needed for nausea or vomiting. 08/02/19  Yes Maudie Flakes, MD  acetaminophen (TYLENOL) 325 MG tablet Take 2 tablets (650 mg total) by mouth every 6 (six) hours as needed. Patient not taking: Reported on 08/04/2019 10/21/16   Jacqlyn Larsen, PA-C  benzonatate (TESSALON PERLES) 100 MG capsule Take 1 capsule (100 mg total) by mouth 3 (three) times daily as needed for cough. Patient not taking: Reported on 08/04/2019 02/06/16   Mackuen, Courteney Lyn, MD  hydrALAZINE (APRESOLINE) 50 MG tablet Take 1 tablet (50 mg total) by mouth 3 (three) times daily. 08/25/18 08/03/19  Thurnell Lose, MD  metFORMIN (GLUCOPHAGE) 500 MG tablet Take 1 tablet (500 mg total) by mouth 2 (two) times daily with a meal. Patient not taking: Reported on 08/04/2019 08/11/18   Jacqlyn Larsen, PA-C  methocarbamol (ROBAXIN) 500 MG tablet Take 1 tablet (500 mg total) by mouth 2 (two) times daily. Patient not taking: Reported on 08/04/2019 10/21/16   Jacqlyn Larsen, PA-C  pantoprazole (PROTONIX) 40 MG tablet Take 1 tablet (40 mg total) by mouth daily. Patient not taking: Reported on 08/04/2019 08/03/19   Hall-Potvin, [place], PA-C  promethazine-dextromethorphan (PROMETHAZINE-DM) 6.25-15 MG/5ML syrup Take 5 mLs by mouth 4 (four) times daily as needed for cough. Patient not taking: Reported on 08/04/2019 02/06/17   Tanna Furry, MD  traMADol (ULTRAM) 50 MG tablet Take 1 tablet (50 mg total) by mouth every 6 (six) hours as needed. Patient not taking: Reported on 08/04/2019 02/06/17   Tanna Furry, MD    Current Facility-Administered Medications  Medication Dose Route Frequency Provider Last Rate Last Admin  . 0.9 %  sodium chloride infusion   Intravenous Continuous Dahal, Marlowe Aschoff, MD 100 mL/hr at 08/06/19 1258 New Bag at 08/06/19 1258  .  acetaminophen (TYLENOL) tablet 650 mg  650 mg Oral Q6H PRN Barb Merino, MD       Or  . acetaminophen (TYLENOL) suppository 650 mg  650 mg Rectal Q6H PRN Barb Merino, MD      . albuterol (PROVENTIL) (2.5 MG/3ML) 0.083% nebulizer solution 2.5 mg  2.5 mg Nebulization Q2H PRN Barb Merino, MD      . amLODipine (NORVASC) tablet 10 mg  10 mg Oral Daily Barb Merino, MD   10 mg at 08/06/19 0913  . calcium carbonate (TUMS - dosed in mg elemental calcium) chewable tablet 200 mg of elemental calcium  1 tablet Oral BID Barb Merino, MD   200 mg of elemental calcium at 08/06/19 0913  . enoxaparin (LOVENOX) injection 40 mg  40 mg Subcutaneous Q24H Dahal, Marlowe Aschoff, MD   40 mg at 08/05/19 2014  . hydrALAZINE (APRESOLINE) tablet  50 mg  50 mg Oral TID Barb Merino, MD   50 mg at 08/06/19 0914  . insulin aspart (novoLOG) injection 0-15 Units  0-15 Units Subcutaneous Q4H Barb Merino, MD   2 Units at 08/06/19 1402  . insulin glargine (LANTUS) injection 10 Units  10 Units Subcutaneous Daily Dahal, Marlowe Aschoff, MD   10 Units at 08/06/19 1258  . melatonin tablet 3 mg  3 mg Oral QHS Terrilee Croak, MD   3 mg at 08/05/19 2123  . metoCLOPramide (REGLAN) injection 10 mg  10 mg Intravenous Q8H PRN Terrilee Croak, MD   10 mg at 08/06/19 1239  . metoprolol tartrate (LOPRESSOR) injection 5 mg  5 mg Intravenous Q6H PRN Lovey Newcomer T, NP   5 mg at 08/05/19 2014  . metroNIDAZOLE (FLAGYL) IVPB 2,000 mg 400 mL  2,000 mg Intravenous Once Barb Merino, MD      . pantoprazole (PROTONIX) injection 40 mg  40 mg Intravenous Q12H Dahal, Marlowe Aschoff, MD   40 mg at 08/06/19 0904  . potassium chloride SA (KLOR-CON) CR tablet 40 mEq  40 mEq Oral Once Terrilee Croak, MD        Allergies as of 08/03/2019 - Review Complete 08/03/2019  Allergen Reaction Noted  . Shellfish allergy Anaphylaxis and Swelling 04/05/2012  . Morphine and related Nausea And Vomiting 08/03/2010    Family History  Problem Relation Age of Onset  . Heart disease  Mother        CHF  . Diabetes Sister   . Anesthesia problems Neg Hx     Social History   Socioeconomic History  . Marital status: Single    Spouse name: Not on file  . Number of children: Not on file  . Years of education: Not on file  . Highest education level: Not on file  Occupational History  . Not on file  Tobacco Use  . Smoking status: Never Smoker  . Smokeless tobacco: Never Used  Substance and Sexual Activity  . Alcohol use: No    Comment: occassionally  . Drug use: No    Types: Cocaine, Marijuana  . Sexual activity: Yes    Birth control/protection: None  Other Topics Concern  . Not on file  Social History Narrative  . Not on file   Social Determinants of Health   Financial Resource Strain:   . Difficulty of Paying Living Expenses:   Food Insecurity:   . Worried About Charity fundraiser in the Last Year:   . Arboriculturist in the Last Year:   Transportation Needs:   . Film/video editor (Medical):   Marland Kitchen Lack of Transportation (Non-Medical):   Physical Activity:   . Days of Exercise per Week:   . Minutes of Exercise per Session:   Stress:   . Feeling of Stress :   Social Connections:   . Frequency of Communication with Friends and Family:   . Frequency of Social Gatherings with Friends and Family:   . Attends Religious Services:   . Active Member of Clubs or Organizations:   . Attends Archivist Meetings:   Marland Kitchen Marital Status:   Intimate Partner Violence:   . Fear of Current or Ex-Partner:   . Emotionally Abused:   Marland Kitchen Physically Abused:   . Sexually Abused:     Review of Systems: ROS is O/W negative except as mentioned in HPI.  Physical Exam: Vital signs in last 24 hours: Temp:  [98.4 F (36.9 C)-99.6 F (37.6 C)]  98.8 F (37.1 C) (07/19 0755) Pulse Rate:  [96-119] 107 (07/19 0755) Resp:  [16-18] 17 (07/19 0755) BP: (121-154)/(63-109) 154/109 (07/19 0755) SpO2:  [100 %] 100 % (07/19 0755) Last BM Date: 08/05/19 General:   Alert, Well-developed, well-nourished, pleasant and cooperative in NAD Head:  Normocephalic and atraumatic. Eyes:  Sclera clear, no icterus.  Conjunctiva pink. Ears:  Normal auditory acuity. Mouth:  No deformity or lesions.   Lungs:  Clear throughout to auscultation.  No wheezes, crackles, or rhonchi.  Heart:  Regular rate and rhythm; no murmurs, clicks, rubs, or gallops. Abdomen:  Soft, non-distended.  BS present.  Non-tender. Msk:  Symmetrical without gross deformities. Pulses:  Normal pulses noted. Extremities:  Without clubbing or edema. Neurologic:  Alert and oriented x 4;  grossly normal neurologically. Skin:  Intact without significant lesions or rashes. Psych:  Alert and cooperative. Normal mood and affect.  Intake/Output from previous day: 07/18 0701 - 07/19 0700 In: 2376 [P.O.:840; I.V.:1536] Out: -   Lab Results: Recent Labs    08/04/19 0757  WBC 11.8*  HGB 9.5*  HCT 31.2*  PLT 400   BMET Recent Labs    08/04/19 0757 08/05/19 0256 08/06/19 0218  NA 139 136 137  K 4.0 3.6 3.2*  CL 111 112* 107  CO2 15* 16* 22  GLUCOSE 185* 143* 143*  BUN 32* 37* 33*  CREATININE 2.82* 2.75* 3.05*  CALCIUM 8.6* 8.4* 8.1*   LFT Recent Labs    08/04/19 0757  PROT 7.3  ALBUMIN 2.9*  AST 26  ALT 17  ALKPHOS 76  BILITOT 0.6   IMPRESSION:  *Nausea and vomiting with wall thickening of the distal esophagus seen by CT scan, likely esophagitis.  Is on pantoprazole 40 mg IV twice daily.  Not on PPI at home.  Denies any previous similar symptoms and no complaints of heartburn or reflux at home.  ? Diabetic gastroparesis, last Hgb A1C was 7.9, it has been much worse in the past.  Reports only rare marijuana use. *Hypokalemia: K+ 3.2 today.  From vomiting. *Chronic microcytic anemia  PLAN: -Will schedule Reglan 10 mg IV ACHS. -Will add zofran every 6 hours prn. -Continue pantoprazole 40 mg IV BID. -EGD per Dr. Henrene Pastor.  Laban Emperor. Zehr  08/06/2019, 2:10 PM  GI  ATTENDING  History: Laboratories, x-rays reviewed.  Patient seen and examined.  Agree with comprehensive consultation note as outlined above.  Patient with multiple medical problems including poorly controlled diabetes mellitus, chronic renal insufficiency, and cannabis use who presents with intractable nausea and vomiting.  Abnormal CT scan shows abnormalities of the distal esophagus which are likely esophagitis.  Candidiasis should be excluded.  Previous EGD, remotely, noted.  Agree with plans for diagnostic upper endoscopy to evaluate symptoms and rule out candidiasis versus reflux esophagitis.  Patient is high risk given her comorbidities.The nature of the procedure, as well as the risks, benefits, and alternatives were carefully and thoroughly reviewed with the patient. Ample time for discussion and questions allowed. The patient understood, was satisfied, and agreed to proceed.  Docia Chuck. Geri Seminole., M.D. [facility] Division of Gastroenterology

## 2019-08-06 NOTE — Anesthesia Preprocedure Evaluation (Addendum)
Anesthesia Evaluation  Patient identified by MRN, date of birth, ID band Patient awake    Reviewed: Allergy & Precautions, NPO status , Patient's Chart, lab work & pertinent test results, reviewed documented beta blocker date and time   Airway Mallampati: II  TM Distance: >3 FB Neck ROM: Full    Dental  (+) Poor Dentition, Missing, Loose,    Pulmonary asthma , pneumonia, resolved,    Pulmonary exam normal breath sounds clear to auscultation       Cardiovascular hypertension, Pt. on medications Normal cardiovascular exam Rhythm:Regular Rate:Normal     Neuro/Psych PSYCHIATRIC DISORDERS Anxiety Depression negative neurological ROS     GI/Hepatic Neg liver ROS, Vomiting Abnormal esophagus on CT Scan   Endo/Other  diabetes, Poorly Controlled, Type 2, Insulin DependentObesity  Renal/GU ARFRenal disease  negative genitourinary   Musculoskeletal negative musculoskeletal ROS (+)   Abdominal (+) + obese,   Peds  Hematology  (+) anemia ,   Anesthesia Other Findings   Reproductive/Obstetrics                            Anesthesia Physical Anesthesia Plan  ASA: III  Anesthesia Plan: MAC   Post-op Pain Management:    Induction: Intravenous  PONV Risk Score and Plan: 2 and Propofol infusion, Treatment may vary due to age or medical condition and Ondansetron  Airway Management Planned: Natural Airway and Nasal Cannula  Additional Equipment:   Intra-op Plan:   Post-operative Plan:   Informed Consent: I have reviewed the patients History and Physical, chart, labs and discussed the procedure including the risks, benefits and alternatives for the proposed anesthesia with the patient or authorized representative who has indicated his/her understanding and acceptance.     Dental advisory given  Plan Discussed with: CRNA and Anesthesiologist  Anesthesia Plan Comments:        Anesthesia  Quick Evaluation

## 2019-08-07 ENCOUNTER — Encounter (HOSPITAL_COMMUNITY)
Admission: EM | Disposition: A | Discharge status: Home / Self Care | Payer: Self-pay | Source: Home / Self Care | Attending: Internal Medicine

## 2019-08-07 ENCOUNTER — Inpatient Hospital Stay (HOSPITAL_COMMUNITY): Payer: Medicaid Other | Admitting: Anesthesiology

## 2019-08-07 ENCOUNTER — Encounter (HOSPITAL_COMMUNITY): Payer: Self-pay | Admitting: Internal Medicine

## 2019-08-07 DIAGNOSIS — K209 Esophagitis, unspecified without bleeding: Secondary | ICD-10-CM

## 2019-08-07 DIAGNOSIS — R935 Abnormal findings on diagnostic imaging of other abdominal regions, including retroperitoneum: Secondary | ICD-10-CM

## 2019-08-07 HISTORY — PX: ESOPHAGOGASTRODUODENOSCOPY (EGD) WITH PROPOFOL: SHX5813

## 2019-08-07 LAB — BASIC METABOLIC PANEL
Anion gap: 10 (ref 5–15)
BUN: 24 mg/dL — ABNORMAL HIGH (ref 6–20)
CO2: 22 mmol/L (ref 22–32)
Calcium: 8.2 mg/dL — ABNORMAL LOW (ref 8.9–10.3)
Chloride: 107 mmol/L (ref 98–111)
Creatinine, Ser: 2.91 mg/dL — ABNORMAL HIGH (ref 0.44–1.00)
GFR calc Af Amer: 22 mL/min — ABNORMAL LOW (ref >60)
GFR calc non Af Amer: 19 mL/min — ABNORMAL LOW (ref >60)
Glucose, Bld: 117 mg/dL — ABNORMAL HIGH (ref 70–99)
Potassium: 3.1 mmol/L — ABNORMAL LOW (ref 3.5–5.1)
Sodium: 139 mmol/L (ref 135–145)

## 2019-08-07 LAB — GLUCOSE, CAPILLARY
Glucose-Capillary: 104 mg/dL — ABNORMAL HIGH (ref 70–99)
Glucose-Capillary: 106 mg/dL — ABNORMAL HIGH (ref 70–99)
Glucose-Capillary: 111 mg/dL — ABNORMAL HIGH (ref 70–99)
Glucose-Capillary: 113 mg/dL — ABNORMAL HIGH (ref 70–99)
Glucose-Capillary: 116 mg/dL — ABNORMAL HIGH (ref 70–99)
Glucose-Capillary: 123 mg/dL — ABNORMAL HIGH (ref 70–99)
Glucose-Capillary: 95 mg/dL (ref 70–99)

## 2019-08-07 SURGERY — ESOPHAGOGASTRODUODENOSCOPY (EGD) WITH PROPOFOL
Anesthesia: Monitor Anesthesia Care

## 2019-08-07 MED ORDER — PROPOFOL 10 MG/ML IV BOLUS
INTRAVENOUS | Status: DC | PRN
  Administered 2019-08-07 (×4): 20 mg via INTRAVENOUS

## 2019-08-07 MED ORDER — POTASSIUM CHLORIDE CRYS ER 20 MEQ PO TBCR
40.0000 meq | EXTENDED_RELEASE_TABLET | Freq: Once | ORAL | Status: AC
  Administered 2019-08-07: 40 meq via ORAL
  Filled 2019-08-07: qty 2

## 2019-08-07 MED ORDER — PROPOFOL 500 MG/50ML IV EMUL
INTRAVENOUS | Status: DC | PRN
  Administered 2019-08-07: 75 ug/kg/min via INTRAVENOUS

## 2019-08-07 MED ORDER — ONDANSETRON HCL 4 MG/2ML IJ SOLN
INTRAMUSCULAR | Status: DC | PRN
  Administered 2019-08-07: 4 mg via INTRAVENOUS

## 2019-08-07 SURGICAL SUPPLY — 15 items

## 2019-08-07 NOTE — Progress Notes (Signed)
Pt tolerated lunch well, no reports of nausea or vomiting, will continue to monitor and see how she tolerates supper.

## 2019-08-07 NOTE — Progress Notes (Signed)
PROGRESS NOTE  [name]  DOB: 1980-12-30  PCP: Patient, No Pcp Per YIR:[id number]  DOA: 08/03/2019  LOS: 3 days   Chief Complaint  Patient presents with  . Abdominal Pain  . Emesis   Brief narrative: 39 year old female with history of type 2 diabetes on insulin, hypertension, chronic kidney disease stage IV asthma and marijuana use. Patient presented to the ED on 7/16 with 4 days of intractable nausea and unable to eat.  In the emergency room, tachycardic.  Blood pressure 200/108.  She had Intractable vomiting.  Labs showed elevated white cell count.  Creatinine 3.18.  Blood sugars 219 and anion gap 18.   CT scan with thickened lower esophagus.  Patient was admitted to the hospital with dehydration and intractable nausea and vomiting.  Subjective: Patient was seen and examined this morning.  In good mood.  Underwent EGD this morning.  Mild esophagitis noted. Nausea better. Wants to try regular food.  Assessment/Plan: Intractable nausea and vomiting:  -Intermittent flareups. -Yesterday morning, patient was feeling fine and wanted to try regular food.  However before she tried food, she got intense vomiting. -Currently on Reglan IV as needed. -Feeling better today and wants to try regular food again. -Continue maintenance IV fluid for now before oral appetite is ensured.. -Continue Protonix. -If persistent symptoms, will discuss with gastroenterology for inpatient procedure. If symptoms improve, will send for outpatient follow-up. She will ultimately need an upper GI endoscopy.  Poorly controlled type 2 diabetes with hyperglycemia:  A1c 7.9 -Blood sugar level trending up, 219 this morning. -No evidence of DKA. -At home, patient was on Lantus 40 units at bedtime, Premeal sliding scale insulin as well as Metformin. -Definitely stop Metformin with significantly elevated creatinine. -Currently on Lantus 10 units daily with sliding scale insulin.  Continue  Accu-Cheks.  Hypertension:  -Continue amlodipine and hydralazine.  -Blood pressure controlled mostly normal range.  AKI on CKD 4 Metabolic acidosis -Baseline creatinine about 2.7.  Presented with elevated creatinine of 3.18.  Also with serum bicarb level low between 13-16.  -Creatinine peaked at 3.05 improved to 2.9 today.  Continue normal saline 100/h. -Continue to monitor creatinine.  Hypokalemia  -Potassium level low at 3.1 this morning.  Oral 40 mEq of potassium ordered.    Anxiety disorder -I do not see any anxiety meds in her list of home medicines.  Mobility: Encourage ambulation Code Status:   Code Status: Full Code  Nutritional status: Body mass index is 31.11 kg/m.     Diet Order            Diet Carb Modified Fluid consistency: Thin; Room service appropriate? Yes  Diet effective now                 DVT prophylaxis: enoxaparin (LOVENOX) injection 40 mg Start: 08/05/19 2000   Antimicrobials:  None Fluid: Normal saline at 100/h  Consultants: None Family Communication:  None at bedside  Status is: Inpatient  Remains inpatient appropriate because:Persistent severe electrolyte disturbances, Ongoing diagnostic testing needed not appropriate for outpatient work up, Unsafe d/c plan and IV treatments appropriate due to intensity of illness or inability to take PO   Dispo:  Patient From: Home  Planned Disposition: Home  Expected discharge date: This afternoon versus tomorrow based on PO tolerance.  Medically stable for discharge: No   Infusions:  . sodium chloride 100 mL/hr at 08/06/19 1600  . metronidazole      Scheduled Meds: . amLODipine  10 mg Oral Daily  .  calcium carbonate  1 tablet Oral BID  . enoxaparin (LOVENOX) injection  40 mg Subcutaneous Q24H  . hydrALAZINE  50 mg Oral TID  . insulin aspart  0-15 Units Subcutaneous Q4H  . insulin glargine  10 Units Subcutaneous Daily  . melatonin  3 mg Oral QHS  . metoCLOPramide (REGLAN) injection  10  mg Intravenous TID AC & HS  . pantoprazole (PROTONIX) IV  40 mg Intravenous Q12H  . potassium chloride  40 mEq Oral Once    Antimicrobials: Anti-infectives (From admission, onward)   Start     Dose/Rate Route Frequency Ordered Stop   08/03/19 1745  metroNIDAZOLE (FLAGYL) IVPB 2,000 mg 400 mL     Discontinue     2,000 mg 400 mL/hr over 60 Minutes Intravenous  Once 08/03/19 1730        PRN meds: acetaminophen **OR** acetaminophen, albuterol, LORazepam, metoprolol tartrate, ondansetron   Objective: Vitals:   08/07/19 1144 08/07/19 1154  BP: (!) 156/109 (!) 165/92  Pulse: (!) 103   Resp: 18   Temp: 98.1 F (36.7 C)   SpO2: 100%     Intake/Output Summary (Last 24 hours) at 08/07/2019 1313 Last data filed at 08/07/2019 0300 Gross per 24 hour  Intake 979.2 ml  Output --  Net 979.2 ml   Filed Weights   08/03/19 2021 08/07/19 0930 08/07/19 1051  Weight: 90.1 kg 90.1 kg 90.1 kg   Weight change:  Body mass index is 31.11 kg/m.   Physical Exam: General exam: Appears calm and comfortable.  Not in physical distress skin: No rashes, lesions or ulcers. HEENT: Atraumatic, normocephalic, supple neck, no obvious bleeding Lungs: Clear to auscultation bilaterally.  No crackles or wheezing. CVS: Regular rate and rhythm, no murmur GI/Abd soft, nontender, nondistended, bowel sound present CNS: Alert, awake, oriented x3 Psychiatry: Tearful, anxious Extremities: No pedal edema, no calf tenderness  Data Review: I have personally reviewed the laboratory data and studies available.  Recent Labs  Lab 08/02/19 0806 08/03/19 1344 08/04/19 0757  WBC 7.5 14.3* 11.8*  NEUTROABS  --  12.9*  --   HGB 9.7* 9.0* 9.5*  HCT 31.7* 30.8* 31.2*  MCV 76.6* 78.0* 75.5*  PLT 373 391 400   Recent Labs  Lab 08/03/19 1344 08/04/19 0757 08/05/19 0256 08/06/19 0218 08/07/19 0418  NA 136 139 136 137 139  K 4.8 4.0 3.6 3.2* 3.1*  CL 105 111 112* 107 107  CO2 13* 15* 16* 22 22  GLUCOSE 219*  185* 143* 143* 117*  BUN 32* 32* 37* 33* 24*  CREATININE 3.18* 2.82* 2.75* 3.05* 2.91*  CALCIUM 9.2 8.6* 8.4* 8.1* 8.2*  MG  --  1.6* 2.1  --   --   PHOS  --  3.3  --   --   --     Signed, Terrilee Croak, MD [facility] Pager: [phone] (Secure Chat preferred). 08/07/2019

## 2019-08-07 NOTE — Transfer of Care (Signed)
Immediate Anesthesia Transfer of Care Note  Patient: [name]  Procedure(s) Performed: ESOPHAGOGASTRODUODENOSCOPY (EGD) WITH PROPOFOL (N/A )  Patient Location: Endoscopy Unit  Anesthesia Type:MAC  Level of Consciousness: awake, alert  and oriented  Airway & Oxygen Therapy: Patient Spontanous Breathing  Post-op Assessment: Report given to RN and Post -op Vital signs reviewed and stable  Post vital signs: Reviewed and stable  Last Vitals:  Vitals Value Taken Time  BP 191/86 08/07/19 1051  Temp 36.6 C 08/07/19 1049  Pulse 101 08/07/19 1051  Resp 11 08/07/19 1051  SpO2 100 % 08/07/19 1051  Vitals shown include unvalidated device data.  Last Pain:  Vitals:   08/07/19 1049  TempSrc: Oral  PainSc:       Patients Stated Pain Goal: 1 (03/75/43 6067)  Complications: No complications documented.

## 2019-08-07 NOTE — Plan of Care (Signed)

## 2019-08-07 NOTE — Op Note (Signed)
[facility] Patient Name: [name] Procedure Date : 08/07/2019 MRN: [id number] Attending MD: Docia Chuck. Henrene Pastor , MD Date of Birth: 18-Nov-1980 CSN: [id number] Age: 39 Admit Type: Inpatient Procedure:                Upper GI endoscopy Indications:              Abnormal CT of the GI tract, Nausea with vomiting Providers:                Docia Chuck. Henrene Pastor, MD, Carmie End, RN, Tyna Jaksch Technician, William Dalton, Technician Referring MD:             [facility] Medicines:                Monitored Anesthesia Care Complications:            No immediate complications. Estimated Blood Loss:     Estimated blood loss: none. Procedure:                Pre-Anesthesia Assessment:                           - Prior to the procedure, a History and Physical                            was performed, and patient medications and                            allergies were reviewed. The patient's tolerance of                            previous anesthesia was also reviewed. The risks                            and benefits of the procedure and the sedation                            options and risks were discussed with the patient.                            All questions were answered, and informed consent                            was obtained. Prior Anticoagulants: The patient has                            taken no previous anticoagulant or antiplatelet                            agents. After reviewing the risks and benefits, the                            patient was deemed in satisfactory condition to  undergo the procedure.                           After obtaining informed consent, the endoscope was                            passed under direct vision. Throughout the                            procedure, the patient's blood pressure, pulse, and                            oxygen saturations were monitored continuously.  The                            [id number]-[id number] ([id number] Olympus gastroscope was                            introduced through the mouth, and advanced to the                            second part of duodenum. The upper GI endoscopy was                            accomplished without difficulty. The patient                            tolerated the procedure well. Scope In: Scope Out: Findings:      The esophagus revealed mild reflux esophagitis. No candidiasis. Large       caliber distal ring.      The stomach retching gastropathy on the proximal stomach. No bleeding.       Small hiatal hernia.      The examined duodenum was normal.      The cardia and gastric fundus were normal on retroflexion. Impression:               1. Mild reflux esophagitis                           2. Retching gastropathy                           3. Otherwise unremarkable exam. Recommendation:           1. Continue twice daily PPI                           2. Continue antiemetic therapy as needed                           3. Advance diet as tolerated                           4. Good diabetic control                           5. Avoid cannabis  6. Resume general medical care with primary                            service. GI will sign off. Results reviewed with                            patient. She was also provided a copy of her                            endoscopy report. Procedure Code(s):        --- Professional ---                           [phone], Esophagogastroduodenoscopy, flexible,                            transoral; diagnostic, including collection of                            specimen(s) by brushing or washing, when performed                            (separate procedure) Diagnosis Code(s):        --- Professional ---                           R11.2, Nausea with vomiting, unspecified                           R93.3, Abnormal findings on diagnostic imaging of                             other parts of digestive tract CPT copyright 2019 American [employer] Association. All rights reserved. The codes documented in this report are preliminary and upon coder review may  be revised to meet current compliance requirements. Docia Chuck. Henrene Pastor, MD 08/07/2019 10:48:53 AM This report has been signed electronically. Number of Addenda: 0

## 2019-08-07 NOTE — Anesthesia Procedure Notes (Signed)
Procedure Name: MAC Date/Time: 08/07/2019 10:08 AM Performed by: Candis Shine, CRNA Pre-anesthesia Checklist: Patient identified, Emergency Drugs available, Suction available, Patient being monitored and Timeout performed Patient Re-evaluated:Patient Re-evaluated prior to induction Oxygen Delivery Method: Nasal cannula Dental Injury: Teeth and Oropharynx as per pre-operative assessment

## 2019-08-07 NOTE — Interval H&P Note (Signed)
History and Physical Interval Note:  08/07/2019 10:07 AM  [name]  has presented today for surgery, with the diagnosis of Vomiting, abnormal CT esophagus.  The various methods of treatment have been discussed with the patient and family. After consideration of risks, benefits and other options for treatment, the patient has consented to  Procedure(s): ESOPHAGOGASTRODUODENOSCOPY (EGD) WITH PROPOFOL (N/A) as a surgical intervention.  The patient's history has been reviewed, patient examined, no change in status, stable for surgery.  I have reviewed the patient's chart and labs.  Questions were answered to the patient's satisfaction.     Scarlette Shorts

## 2019-08-07 NOTE — Anesthesia Postprocedure Evaluation (Signed)
Anesthesia Post Note  Patient: [name]  Procedure(s) Performed: ESOPHAGOGASTRODUODENOSCOPY (EGD) WITH PROPOFOL (N/A )     Patient location during evaluation: PACU Anesthesia Type: MAC Level of consciousness: awake and alert and oriented Pain management: pain level controlled Vital Signs Assessment: post-procedure vital signs reviewed and stable Respiratory status: spontaneous breathing, nonlabored ventilation and respiratory function stable Cardiovascular status: stable and blood pressure returned to baseline Postop Assessment: no apparent nausea or vomiting Anesthetic complications: no   No complications documented.  Last Vitals:  Vitals:   08/07/19 1100 08/07/19 1110  BP: (!) 164/70 (!) 169/86  Pulse: (!) 105 (!) 105  Resp: (!) 23 12  Temp:    SpO2: 95% 99%    Last Pain:  Vitals:   08/07/19 1110  TempSrc:   PainSc: 0-No pain                 Maximilian Tallo A.

## 2019-08-08 ENCOUNTER — Encounter (HOSPITAL_COMMUNITY): Payer: Self-pay | Admitting: Internal Medicine

## 2019-08-08 LAB — BASIC METABOLIC PANEL
Anion gap: 9 (ref 5–15)
BUN: 21 mg/dL — ABNORMAL HIGH (ref 6–20)
CO2: 22 mmol/L (ref 22–32)
Calcium: 8.2 mg/dL — ABNORMAL LOW (ref 8.9–10.3)
Chloride: 108 mmol/L (ref 98–111)
Creatinine, Ser: 2.93 mg/dL — ABNORMAL HIGH (ref 0.44–1.00)
GFR calc Af Amer: 22 mL/min — ABNORMAL LOW (ref >60)
GFR calc non Af Amer: 19 mL/min — ABNORMAL LOW (ref >60)
Glucose, Bld: 93 mg/dL (ref 70–99)
Potassium: 3.3 mmol/L — ABNORMAL LOW (ref 3.5–5.1)
Sodium: 139 mmol/L (ref 135–145)

## 2019-08-08 LAB — CULTURE, BLOOD (ROUTINE X 2)
Culture: NO GROWTH
Culture: NO GROWTH

## 2019-08-08 LAB — GLUCOSE, CAPILLARY
Glucose-Capillary: 98 mg/dL (ref 70–99)
Glucose-Capillary: 93 mg/dL (ref 70–99)

## 2019-08-08 MED ORDER — INSULIN GLARGINE 100 UNIT/ML ~~LOC~~ SOLN: 10.0000 [IU] | Freq: Every day | SUBCUTANEOUS | Status: DC

## 2019-08-08 MED ORDER — PANTOPRAZOLE SODIUM 40 MG PO TBEC
40.0000 mg | DELAYED_RELEASE_TABLET | Freq: Every day | ORAL | 0 refills | Status: DC
Start: 2019-08-08 — End: 2020-01-31

## 2019-08-08 NOTE — Plan of Care (Signed)
  Problem: Education: Goal: Knowledge of General Education information will improve Description: Including pain rating scale, medication(s)/side effects and non-pharmacologic comfort measures Outcome: Progressing   Problem: Pain Managment: Goal: General experience of comfort will improve Outcome: Progressing   Problem: Safety: Goal: Ability to remain free from injury will improve Outcome: Progressing   

## 2019-08-08 NOTE — Plan of Care (Signed)

## 2019-08-08 NOTE — Discharge Summary (Signed)
Physician Discharge Summary  [name] FWY:[id number] DOB: [birth year] DOA: 08/03/2019  PCP: [name], No Pcp Per  Admit date: 08/03/2019 Discharge date: 08/08/2019  Admitted From: Home Discharge disposition: Home   Code Status: Full Code  Diet Recommendation: Diabetic/cardiac diet   Recommendations for Outpatient Follow-Up:   Follow-up with PCP as an outpatient Needs to follow-up with nephrology as an outpatient as well  Discharge Diagnosis:   Principal Problem:   Intractable nausea and vomiting Active Problems:   Type 2 diabetes mellitus ([facility])   Depression   ARF (acute renal failure) ([facility])   Uncontrolled type 2 diabetes mellitus with hyperglycemia ([facility])   Nausea & vomiting   Abnormal CT of the abdomen   Acute esophagitis    History of Present Illness / Brief narrative:  39 year old female with history of type 2 diabetes on insulin, hypertension, chronic kidney disease stage IV asthma and marijuana use. [name] presented to the ED on 7/16 with 4 days of intractable nausea and unable to eat.  In the emergency room, tachycardic.  Blood pressure 200/108.  She had Intractable vomiting.  Labs showed elevated white cell count.  Creatinine 3.18.  Blood sugars 219 and anion gap 18.   CT scan with thickened lower esophagus.   [name] was admitted to the hospital with dehydration and intractable nausea and vomiting.  Hospital Course:  Intractable nausea and vomiting:  -Admitted for intractable nausea and vomiting, poor oral intake. -No clear etiology.  Improving with symptomatic management with IV Reglan, IV Protonix. -CT scan on admission showed esophageal wall thickness.  GI consultation was obtained.  EGD obtained on 7/20 which showed mild esophagitis only. -[name] now able to tolerate regular food and wants to be discharged.   Poorly controlled type 2 diabetes with hyperglycemia:  A1c 7.9 -No evidence of DKA. -At home, [name] was on Lantus 40 units at bedtime, Premeal  sliding scale insulin as well as Metformin. -Currently [name] is on Lantus 10 units daily only and her blood sugar level is controlled under 100 mostly.  I am not sure if she was compliant with the stated home dose of Lantus. -I will discharge her on only 10 units of Lantus nightly along with premeal insulin. -Definitely stop Metformin with significantly elevated creatinine.   Hypertension:  -Continue amlodipine and hydralazine.  -Blood pressure controlled mostly normal range.   AKI on CKD 4 Metabolic acidosis -Baseline creatinine about 2.7.  Presented with elevated creatinine of 3.18.  Also with serum bicarb level low between 13-16.  -Creatinine peaked at 3.05.  With IV hydration, improved to 2.93 today.  -Continue oral hydration at home. -Follow-up with PCP/nephrology as an outpatient for further monitoring.    Hypokalemia  -Potassium level improved with replacement.   Anxiety disorder -I do not see any anxiety meds in her list of home medicines.   Mobility: Encourage ambulation Code Status:   Code Status: Full Code  Wound care:    Subjective:  Seen and examined this morning. Not in distress.  Nausea improving. Wants to go home.  Discharge Exam:   Vitals:   08/07/19 1144 08/07/19 1154 08/07/19 2021 08/08/19 0348  BP: (!) 156/109 (!) 165/92 139/69 (!) 145/83  Pulse: (!) 103  100 97  Resp: 18  20 18   Temp: 98.1 F (36.7 C)  98.1 F (36.7 C) 98.2 F (36.8 C)  TempSrc: Oral  Oral Oral  SpO2: 100%  99% 100%  Weight:      Height:  Body mass index is 31.11 kg/m.  General exam: Appears calm and comfortable.  Skin: No rashes, lesions or ulcers. HEENT: Atraumatic, normocephalic, supple neck, no obvious bleeding Lungs: Clear to auscultate bilaterally CVS: Regular rate and rhythm, no murmur GI/Abd soft, nontender, nondistended, bowel sound present CNS: Alert, awake, oriented 3 Psychiatry: Mood appropriate Extremities: No pedal edema, no calf  tenderness  Discharge Instructions:   Discharge Instructions     Diet - low sodium heart healthy   Complete by: As directed    Diet Carb Modified   Complete by: As directed    Increase activity slowly   Complete by: As directed        Follow-up Information     [facility].   Contact information: [place] [id number] [place], [facility].   Specialty: Nephrology Contact information: [address] [place] [address] [phone]                Allergies as of 08/08/2019       Reactions   Shellfish Allergy Anaphylaxis, Swelling   Morphine And Related Nausea And Vomiting        Medication List     STOP taking these medications    acetaminophen 325 MG tablet Commonly known as: Tylenol   benzonatate 100 MG capsule Commonly known as: Tessalon Perles   metFORMIN 500 MG tablet Commonly known as: GLUCOPHAGE   methocarbamol 500 MG tablet Commonly known as: ROBAXIN   promethazine-dextromethorphan 6.25-15 MG/5ML syrup Commonly known as: PROMETHAZINE-DM   traMADol 50 MG tablet Commonly known as: ULTRAM       TAKE these medications    amLODipine 10 MG tablet Commonly known as: NORVASC Take 1 tablet (10 mg total) by mouth daily.   ferrous sulfate 325 (65 FE) MG tablet Take 1 tablet (325 mg total) by mouth 3 (three) times daily with meals.   hydrALAZINE 50 MG tablet Commonly known as: APRESOLINE Take 1 tablet (50 mg total) by mouth 3 (three) times daily.   insulin aspart 100 UNIT/ML injection Commonly known as: NovoLOG Before each meal 3 times a day , 140-199 - 2 units, 200-250 - 4 units, 251-299 - 6 units,  300-349 - 8 units,  350 or above 10 units. Insulin PEN if approved, provide syringes and needles if needed. Can switch to any other cheaper alternative. What changed:  how much to take how to  take this when to take this additional instructions   insulin glargine 100 UNIT/ML injection Commonly known as: Lantus Inject 0.1 mLs (10 Units total) into the skin at bedtime. What changed:  how much to take additional instructions   Insulin Syringe-Needle U-100 25G X 1" 1 ML Misc For 4 times a day insulin SQ, 1 month supply. Diagnosis E11.65   ondansetron 4 MG disintegrating tablet Commonly known as: Zofran ODT Take 1 tablet (4 mg total) by mouth every 8 (eight) hours as needed for nausea or vomiting.   pantoprazole 40 MG tablet Commonly known as: PROTONIX Take 1 tablet (40 mg total) by mouth daily.        Time coordinating discharge: 35 minutes  The results of significant diagnostics from this hospitalization (including imaging, microbiology, ancillary and laboratory) are listed below for reference.    Procedures and Diagnostic Studies:   CT ABDOMEN PELVIS WO CONTRAST  Result Date: 08/03/2019 CLINICAL DATA:  Epigastric  pain and nausea and vomiting beginning yesterday. EXAM: CT ABDOMEN AND PELVIS WITHOUT CONTRAST TECHNIQUE: Multidetector CT imaging of the abdomen and pelvis was performed following the standard protocol without IV contrast. COMPARISON:  08/11/2018 FINDINGS: Lower chest: No acute findings. Hepatobiliary: No mass visualized on this unenhanced exam. Gallbladder is unremarkable. No evidence of biliary ductal dilatation. Pancreas: No mass or inflammatory process visualized on this unenhanced exam. Spleen:  Within normal limits in size. Adrenals/Urinary tract: No evidence of urolithiasis or hydronephrosis. Unremarkable unopacified urinary bladder. Stomach/Bowel: No evidence of hiatal hernia, however or wall thickening is seen involving the visualized portion of the distal esophagus, suspicious for esophagitis. No evidence of obstruction, inflammatory process, or abnormal fluid collections. Normal appendix visualized. Vascular/Lymphatic: No pathologically enlarged lymph  nodes identified. No evidence of abdominal aortic aneurysm. Reproductive:  No mass or other significant abnormality. Other:  None. Musculoskeletal:  No suspicious bone lesions identified. IMPRESSION: Wall thickening involving the visualized portion of the distal esophagus, suspicious for esophagitis. Consider upper endoscopy for further evaluation. No other significant abnormality identified. Electronically Signed   By: Marlaine Hind M.D.   On: 08/03/2019 17:16     Labs:   Basic Metabolic Panel: Recent Labs  Lab 08/04/19 0757 08/04/19 0757 08/05/19 0256 08/05/19 0256 08/06/19 0218 08/06/19 0218 08/07/19 0418 08/08/19 0314  NA 139  --  136  --  137  --  139 139  K 4.0   < > 3.6   < > 3.2*   < > 3.1* 3.3*  CL 111  --  112*  --  107  --  107 108  CO2 15*  --  16*  --  22  --  22 22  GLUCOSE 185*  --  143*  --  143*  --  117* 93  BUN 32*  --  37*  --  33*  --  24* 21*  CREATININE 2.82*  --  2.75*  --  3.05*  --  2.91* 2.93*  CALCIUM 8.6*  --  8.4*  --  8.1*  --  8.2* 8.2*  MG 1.6*  --  2.1  --   --   --   --   --   PHOS 3.3  --   --   --   --   --   --   --    < > = values in this interval not displayed.   GFR Estimated Creatinine Clearance: 29.1 mL/min (A) (by C-G formula based on SCr of 2.93 mg/dL (H)). Liver Function Tests: Recent Labs  Lab 08/02/19 0806 08/03/19 1344 08/04/19 0757  AST 16 26 26   ALT 13 17 17   ALKPHOS 74 69 76  BILITOT 0.8 0.6 0.6  PROT 8.0 7.9 7.3  ALBUMIN 3.2* 3.2* 2.9*   Recent Labs  Lab 08/02/19 0806 08/03/19 1344  LIPASE 32 27   No results for input(s): AMMONIA in the last 168 hours. Coagulation profile No results for input(s): INR, PROTIME in the last 168 hours.  CBC: Recent Labs  Lab 08/02/19 0806 08/03/19 1344 08/04/19 0757  WBC 7.5 14.3* 11.8*  NEUTROABS  --  12.9*  --   HGB 9.7* 9.0* 9.5*  HCT 31.7* 30.8* 31.2*  MCV 76.6* 78.0* 75.5*  PLT 373 391 400   Cardiac Enzymes: No results for input(s): CKTOTAL, CKMB, CKMBINDEX,  TROPONINI in the last 168 hours. BNP: Invalid input(s): POCBNP CBG: Recent Labs  Lab 08/07/19 1224 08/07/19 1717 08/07/19 2029 08/08/19 0354 08/08/19 0815  GLUCAP 123*  113* 95 98 93   D-Dimer No results for input(s): DDIMER in the last 72 hours. Hgb A1c No results for input(s): HGBA1C in the last 72 hours. Lipid Profile No results for input(s): CHOL, HDL, LDLCALC, TRIG, CHOLHDL, LDLDIRECT in the last 72 hours. Thyroid function studies No results for input(s): TSH, T4TOTAL, T3FREE, THYROIDAB in the last 72 hours.  Invalid input(s): FREET3 Anemia work up No results for input(s): VITAMINB12, FOLATE, FERRITIN, TIBC, IRON, RETICCTPCT in the last 72 hours. Microbiology Recent Results (from the past 240 hour(s))  Urine culture     Status: None   Collection Time: 08/03/19  8:50 AM   Specimen: Urine, Random  Result Value Ref Range Status   Specimen Description URINE, RANDOM  Final   Special Requests NONE  Final   Culture   Final    NO GROWTH Performed at [facility], [address], [place], [place] [address]    Report Status 08/04/2019 FINAL  Final  SARS Coronavirus 2 by RT PCR (hospital order, performed in [facility] hospital lab) Nasopharyngeal Nasopharyngeal Swab     Status: None   Collection Time: 08/03/19  3:15 PM   Specimen: Nasopharyngeal Swab  Result Value Ref Range Status   SARS Coronavirus 2 NEGATIVE NEGATIVE Final    Comment: (NOTE) SARS-CoV-2 target nucleic acids are NOT DETECTED.  The SARS-CoV-2 RNA is generally detectable in upper and lower respiratory specimens during the acute phase of infection. The lowest concentration of SARS-CoV-2 viral copies this assay can detect is 250 copies / mL. A negative result does not preclude SARS-CoV-2 infection and should not be used as the sole basis for treatment or other [name] management decisions.  A negative result may occur with improper specimen collection / handling, submission of specimen other than  nasopharyngeal swab, presence of viral mutation(s) within the areas targeted by this assay, and inadequate number of viral copies (<250 copies / mL). A negative result must be combined with clinical observations, [name] history, and epidemiological information.  Fact Sheet for Patients:   StrictlyIdeas.no  Fact Sheet for Healthcare Providers: BankingDealers.co.za  This test is not yet approved or  cleared by the [place] FDA and has been authorized for detection and/or diagnosis of SARS-CoV-2 by FDA under an Emergency Use Authorization (EUA).  This EUA will remain in effect (meaning this test can be used) for the duration of the COVID-19 declaration under Section 564(b)(1) of the Act, 21 U.S.C. section 360bbb-3(b)(1), unless the authorization is terminated or revoked sooner.  Performed at [facility], [place] [address], [place], [place] [address]   Blood culture (routine x 2)     Status: None   Collection Time: 08/03/19  3:52 PM   Specimen: BLOOD  Result Value Ref Range Status   Specimen Description BLOOD LEFT ANTECUBITAL  Final   Special Requests   Final    BOTTLES DRAWN AEROBIC AND ANAEROBIC Blood Culture results may not be optimal due to an inadequate volume of blood received in culture bottles   Culture   Final    NO GROWTH 5 DAYS Performed at [facility], [place] [address] [place], [place] [address]    Report Status 08/08/2019 FINAL  Final  Blood culture (routine x 2)     Status: None   Collection Time: 08/03/19  3:53 PM   Specimen: BLOOD LEFT HAND  Result Value Ref Range Status   Specimen Description BLOOD LEFT HAND  Final   Special Requests  Final    BOTTLES DRAWN AEROBIC ONLY Blood Culture results may not be optimal due to an inadequate volume of blood received in culture bottles   Culture   Final    NO GROWTH 5 DAYS Performed at [facility], [place] [address] [place],  [address]    Report  Status 08/08/2019 FINAL  Final    Please note: You were cared for by a hospitalist during your hospital stay. Once you are discharged, your primary care physician will handle any further medical issues. Please note that NO REFILLS for any discharge medications will be authorized once you are discharged, as it is imperative that you return to your primary care physician (or establish a relationship with a primary care physician if you do not have one) for your post hospital discharge needs so that they can reassess your need for medications and monitor your lab values.  Signed: Terrilee Croak  [facility] 08/08/2019, 1:14 PM

## 2019-08-08 NOTE — Plan of Care (Signed)
  Problem: Education: Goal: Knowledge of General Education information will improve Description: Including pain rating scale, medication(s)/side effects and non-pharmacologic comfort measures 08/08/2019 0907 by Melina Schools, RN Outcome: Adequate for Discharge 08/08/2019 0752 by Melina Schools, RN Outcome: Progressing   Problem: Health Behavior/Discharge Planning: Goal: Ability to manage health-related needs will improve Outcome: Adequate for Discharge   Problem: Clinical Measurements: Goal: Ability to maintain clinical measurements within normal limits will improve Outcome: Adequate for Discharge Goal: Will remain free from infection Outcome: Adequate for Discharge Goal: Diagnostic test results will improve Outcome: Adequate for Discharge Goal: Respiratory complications will improve Outcome: Adequate for Discharge Goal: Cardiovascular complication will be avoided Outcome: Adequate for Discharge   Problem: Activity: Goal: Risk for activity intolerance will decrease Outcome: Adequate for Discharge   Problem: Nutrition: Goal: Adequate nutrition will be maintained Outcome: Adequate for Discharge   Problem: Coping: Goal: Level of anxiety will decrease Outcome: Adequate for Discharge   Problem: Elimination: Goal: Will not experience complications related to bowel motility Outcome: Adequate for Discharge Goal: Will not experience complications related to urinary retention Outcome: Adequate for Discharge   Problem: Pain Managment: Goal: General experience of comfort will improve 08/08/2019 0907 by Melina Schools, RN Outcome: Adequate for Discharge 08/08/2019 0752 by Melina Schools, RN Outcome: Progressing   Problem: Safety: Goal: Ability to remain free from injury will improve 08/08/2019 0907 by Melina Schools, RN Outcome: Adequate for Discharge 08/08/2019 0752 by Melina Schools, RN Outcome: Progressing   Problem: Skin Integrity: Goal: Risk for  impaired skin integrity will decrease Outcome: Adequate for Discharge

## 2019-09-03 ENCOUNTER — Inpatient Hospital Stay: Payer: Self-pay | Admitting: Family Medicine

## 2020-01-31 ENCOUNTER — Encounter (HOSPITAL_COMMUNITY): Payer: Self-pay

## 2020-01-31 ENCOUNTER — Emergency Department (HOSPITAL_COMMUNITY)
Admission: EM | Admit: 2020-01-31 | Discharge: 2020-01-31 | Disposition: A | Discharge status: Home / Self Care | Payer: Medicaid Other | Attending: Emergency Medicine | Admitting: Emergency Medicine

## 2020-01-31 DIAGNOSIS — R112 Nausea with vomiting, unspecified: Secondary | ICD-10-CM | POA: Insufficient documentation

## 2020-01-31 DIAGNOSIS — Z794 Long term (current) use of insulin: Secondary | ICD-10-CM | POA: Insufficient documentation

## 2020-01-31 DIAGNOSIS — Z79899 Other long term (current) drug therapy: Secondary | ICD-10-CM | POA: Diagnosis not present

## 2020-01-31 DIAGNOSIS — E1110 Type 2 diabetes mellitus with ketoacidosis without coma: Secondary | ICD-10-CM | POA: Insufficient documentation

## 2020-01-31 DIAGNOSIS — E1165 Type 2 diabetes mellitus with hyperglycemia: Secondary | ICD-10-CM | POA: Diagnosis not present

## 2020-01-31 DIAGNOSIS — J45909 Unspecified asthma, uncomplicated: Secondary | ICD-10-CM | POA: Insufficient documentation

## 2020-01-31 DIAGNOSIS — I10 Essential (primary) hypertension: Secondary | ICD-10-CM | POA: Diagnosis not present

## 2020-01-31 LAB — CBC
HCT: 28.3 % — ABNORMAL LOW (ref 36.0–46.0)
Hemoglobin: 8.3 g/dL — ABNORMAL LOW (ref 12.0–15.0)
MCH: 22.5 pg — ABNORMAL LOW (ref 26.0–34.0)
MCHC: 29.3 g/dL — ABNORMAL LOW (ref 30.0–36.0)
MCV: 76.7 fL — ABNORMAL LOW (ref 80.0–100.0)
Platelets: 307 10*3/uL (ref 150–400)
RBC: 3.69 MIL/uL — ABNORMAL LOW (ref 3.87–5.11)
RDW: 16.7 % — ABNORMAL HIGH (ref 11.5–15.5)
WBC: 9.1 10*3/uL (ref 4.0–10.5)
nRBC: 0 % (ref 0.0–0.2)

## 2020-01-31 LAB — I-STAT VENOUS BLOOD GAS, ED
Acid-base deficit: 6 mmol/L — ABNORMAL HIGH (ref 0.0–2.0)
Bicarbonate: 17.9 mmol/L — ABNORMAL LOW (ref 20.0–28.0)
Calcium, Ion: 1.14 mmol/L — ABNORMAL LOW (ref 1.15–1.40)
HCT: 27 % — ABNORMAL LOW (ref 36.0–46.0)
Hemoglobin: 9.2 g/dL — ABNORMAL LOW (ref 12.0–15.0)
O2 Saturation: 65 %
Potassium: 4.9 mmol/L (ref 3.5–5.1)
Sodium: 138 mmol/L (ref 135–145)
TCO2: 19 mmol/L — ABNORMAL LOW (ref 22–32)
pCO2, Ven: 28.9 mmHg — ABNORMAL LOW (ref 44.0–60.0)
pH, Ven: 7.4 (ref 7.250–7.430)
pO2, Ven: 33 mmHg (ref 32.0–45.0)

## 2020-01-31 LAB — URINALYSIS, ROUTINE W REFLEX MICROSCOPIC
Bacteria, UA: NONE SEEN
Bilirubin Urine: NEGATIVE
Glucose, UA: >=500 mg/dL — AB
Hgb urine dipstick: NEGATIVE
Ketones, ur: NEGATIVE mg/dL
Leukocytes,Ua: NEGATIVE
Nitrite: NEGATIVE
Protein, ur: >=300 mg/dL — AB
Specific Gravity, Urine: 1.008 (ref 1.005–1.030)
pH: 7 (ref 5.0–8.0)

## 2020-01-31 LAB — COMPREHENSIVE METABOLIC PANEL
ALT: 10 U/L (ref 0–44)
AST: 13 U/L — ABNORMAL LOW (ref 15–41)
Albumin: 3 g/dL — ABNORMAL LOW (ref 3.5–5.0)
Alkaline Phosphatase: 75 U/L (ref 38–126)
Anion gap: 12 (ref 5–15)
BUN: 27 mg/dL — ABNORMAL HIGH (ref 6–20)
CO2: 17 mmol/L — ABNORMAL LOW (ref 22–32)
Calcium: 9.1 mg/dL (ref 8.9–10.3)
Chloride: 106 mmol/L (ref 98–111)
Creatinine, Ser: 2.62 mg/dL — ABNORMAL HIGH (ref 0.44–1.00)
GFR, Estimated: 23 mL/min — ABNORMAL LOW (ref >60)
Glucose, Bld: 260 mg/dL — ABNORMAL HIGH (ref 70–99)
Potassium: 4.9 mmol/L (ref 3.5–5.1)
Sodium: 135 mmol/L (ref 135–145)
Total Bilirubin: 0.3 mg/dL (ref 0.3–1.2)
Total Protein: 7.5 g/dL (ref 6.5–8.1)

## 2020-01-31 LAB — I-STAT BETA HCG BLOOD, ED (MC, WL, AP ONLY): I-stat hCG, quantitative: <5 m[IU]/mL (ref <5)

## 2020-01-31 LAB — LIPASE, BLOOD: Lipase: 43 U/L (ref 11–51)

## 2020-01-31 MED ORDER — ONDANSETRON 4 MG PO TBDP: 4.0000 mg | ORAL_TABLET | Freq: Three times a day (TID) | ORAL | 0 refills | Status: DC | PRN

## 2020-01-31 MED ORDER — SODIUM CHLORIDE 0.9 % IV BOLUS
1000.0000 mL | Freq: Once | INTRAVENOUS | Status: AC
  Administered 2020-01-31: 1000 mL via INTRAVENOUS

## 2020-01-31 MED ORDER — HYDRALAZINE HCL 50 MG PO TABS: 50.0000 mg | ORAL_TABLET | Freq: Three times a day (TID) | ORAL | 0 refills | Status: DC

## 2020-01-31 MED ORDER — ONDANSETRON 4 MG PO TBDP
4.0000 mg | ORAL_TABLET | Freq: Once | ORAL | Status: DC | PRN
  Filled 2020-01-31: qty 1

## 2020-01-31 MED ORDER — HALOPERIDOL LACTATE 5 MG/ML IJ SOLN
5.0000 mg | Freq: Once | INTRAMUSCULAR | Status: AC
  Administered 2020-01-31: 5 mg via INTRAVENOUS
  Filled 2020-01-31: qty 1

## 2020-01-31 MED ORDER — PANTOPRAZOLE SODIUM 40 MG PO TBEC: 40.0000 mg | DELAYED_RELEASE_TABLET | Freq: Every day | ORAL | 0 refills | Status: DC

## 2020-01-31 MED ORDER — AMLODIPINE BESYLATE 10 MG PO TABS: 10.0000 mg | ORAL_TABLET | Freq: Every day | ORAL | 0 refills | Status: DC

## 2020-01-31 MED ORDER — LABETALOL HCL 5 MG/ML IV SOLN
10.0000 mg | Freq: Once | INTRAVENOUS | Status: AC
  Administered 2020-01-31: 10 mg via INTRAVENOUS
  Filled 2020-01-31: qty 4

## 2020-01-31 NOTE — ED Provider Notes (Signed)
[facility] EMERGENCY DEPARTMENT Provider Note   CSN: [id number] Arrival date & time: 01/31/20  0631     History Chief Complaint  Patient presents with  . Nausea    [name] is a 40 y.o. female.  The history is provided by the patient and medical records. No language interpreter was used.      40 year old female significant history of diabetes, hypertension, anxiety, asthma, regular marijuana use brought here via EMS from home for evaluation of persistent nausea and vomiting. Patient report for the past 3 days she has had persistent bouts of nausea, diaphoresis, vomiting multiple episodes of clear and sometimes bilious content. She endorsed mild abdominal cramping but mostly feeling very nauseous. No report of fever chills runny nose sneezing coughing. No change in bowel habit no urinary discomfort. She hasn't been taking her blood pressure medication for quite a while. No headache. Patient has been fully vaccinated for COVID-19.  Past Medical History:  Diagnosis Date  . Anxiety   . Asthma   . Depression   . Diabetes mellitus   . Hyperemesis arising during pregnancy   . Hypertension   . Mental disorder     Patient Active Problem List   Diagnosis Date Noted  . Abnormal CT of the abdomen   . Acute esophagitis   . Nausea & vomiting 08/04/2019  . Intractable nausea and vomiting 08/03/2019  . ARF (acute renal failure) ([place]) 08/24/2018  . Uncontrolled type 2 diabetes mellitus with hyperglycemia ([place]) 08/24/2018  . DKA (diabetic ketoacidoses) 08/24/2018  . Non-intractable vomiting   . Sepsis ([place]) 02/15/2016  . Generalized abdominal pain 02/15/2016  . CAP (community acquired pneumonia) 02/14/2016  . 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 Back pain 07/28/2011  . H. pylori infection 11/24/2010  . Depression 11/20/2010  . Asthma 11/20/2010  . Chronic hypertension in pregnancy 11/08/2010  . Type 2 diabetes mellitus ([place]) 11/07/2010    Past Surgical  History:  Procedure Laterality Date  . ESOPHAGOGASTRODUODENOSCOPY (EGD) WITH PROPOFOL N/A 08/07/2019   Procedure: ESOPHAGOGASTRODUODENOSCOPY (EGD) WITH PROPOFOL;  Surgeon: Irene Shipper, MD;  Location: [facility] END[facility]COPY;  Service: Endoscopy;  Laterality: N/A;  . NO PAST SURGERIES    . TUBAL LIGATION  06/20/2011   Procedure: POST PARTUM TUBAL LIGATION;  Surgeon: Mora Bellman, MD;  Location: [facility];  Service: Gynecology;  Laterality: Bilateral;  Bilateral post partum tubal ligation     OB History    Gravida  4   Para  3   Term  2   Preterm  1   AB  1   Living  3     SAB  0   IAB  0   Ectopic  1   Multiple  0   Live Births  3           Family History  Problem Relation Age of Onset  . Heart disease Mother        CHF  . Diabetes Sister   . Anesthesia problems Neg Hx     Social History   Tobacco Use  . Smoking status: Never Smoker  . Smokeless tobacco: Never Used  Substance Use Topics  . Alcohol use: No    Comment: occassionally  . Drug use: No    Types: Cocaine, Marijuana    Home Medications Prior to Admission medications   Medication Sig Start Date End Date Taking? Authorizing Provider  amLODipine (NORVASC) 10 MG tablet Take 1 tablet (10 mg total) by mouth daily.  08/25/18   Thurnell Lose, MD  ferrous sulfate 325 (65 FE) MG tablet Take 1 tablet (325 mg total) by mouth 3 (three) times daily with meals. 02/18/16   Velvet Bathe, MD  hydrALAZINE (APRESOLINE) 50 MG tablet Take 1 tablet (50 mg total) by mouth 3 (three) times daily. 08/25/18 08/03/19  Thurnell Lose, MD  insulin aspart (NOVOLOG) 100 UNIT/ML injection Before each meal 3 times a day , 140-199 - 2 units, 200-250 - 4 units, 251-299 - 6 units,  300-349 - 8 units,  350 or above 10 units. Insulin PEN if approved, provide syringes and needles if needed. Can switch to any other cheaper alternative. Patient taking differently: Inject 2-10 Units into the skin See admin instructions. Before each meal 3 times a  day , 140-199 - 2 units, 200-250 - 4 units, 251-299 - 6 units,  300-349 - 8 units,  350 or above 10 units. 08/25/18   Thurnell Lose, MD  insulin glargine (LANTUS) 100 UNIT/ML injection Inject 0.1 mLs (10 Units total) into the skin at bedtime. 08/08/19   Dahal, Marlowe Aschoff, MD  Insulin Syringe-Needle U-100 25G X 1" 1 ML MISC For 4 times a day insulin SQ, 1 month supply. Diagnosis E11.65 08/25/18   Thurnell Lose, MD  ondansetron (ZOFRAN ODT) 4 MG disintegrating tablet Take 1 tablet (4 mg total) by mouth every 8 (eight) hours as needed for nausea or vomiting. 08/02/19   Maudie Flakes, MD  pantoprazole (PROTONIX) 40 MG tablet Take 1 tablet (40 mg total) by mouth daily. 08/08/19 11/06/19  Terrilee Croak, MD    Allergies    Shellfish allergy and Morphine and related  Review of Systems   Review of Systems  All other systems reviewed and are negative.   Physical Exam Updated Vital Signs BP (!) 215/114   Pulse 91   Temp 98.1 F (36.7 C) (Oral)   Resp 18   SpO2 100%   Physical Exam Vitals and nursing note reviewed.  Constitutional:      Appearance: She is well-developed and well-nourished. She is obese. She is diaphoretic.     Comments: Obese female, actively vomiting appears uncomfortable  HENT:     Head: Normocephalic and atraumatic.  Eyes:     Conjunctiva/sclera: Conjunctivae normal.  Cardiovascular:     Rate and Rhythm: Normal rate and regular rhythm.     Pulses: Normal pulses.     Heart sounds: Normal heart sounds.  Pulmonary:     Effort: Pulmonary effort is normal.     Breath sounds: Normal breath sounds. No wheezing, rhonchi or rales.  Abdominal:     General: Bowel sounds are normal.     Palpations: Abdomen is soft.     Tenderness: There is no abdominal tenderness.  Musculoskeletal:     Cervical back: Neck supple.  Skin:    Findings: No rash.  Neurological:     Mental Status: She is alert. Mental status is at baseline.  Psychiatric:        Mood and Affect: Mood and affect  and mood normal.     ED Results / Procedures / Treatments   Labs (all labs ordered are listed, but only abnormal results are displayed) Labs Reviewed  COMPREHENSIVE METABOLIC PANEL - Abnormal; Notable for the following components:      Result Value   CO2 17 (*)    Glucose, Bld 260 (*)    BUN 27 (*)    Creatinine, Ser 2.62 (*)  Albumin 3.0 (*)    AST 13 (*)    GFR, Estimated 23 (*)    All other components within normal limits  CBC - Abnormal; Notable for the following components:   RBC 3.69 (*)    Hemoglobin 8.3 (*)    HCT 28.3 (*)    MCV 76.7 (*)    MCH 22.5 (*)    MCHC 29.3 (*)    RDW 16.7 (*)    All other components within normal limits  URINALYSIS, ROUTINE W REFLEX MICROSCOPIC - Abnormal; Notable for the following components:   Color, Urine STRAW (*)    Glucose, UA >=500 (*)    Protein, ur >=300 (*)    All other components within normal limits  I-STAT VENOUS BLOOD GAS, ED - Abnormal; Notable for the following components:   pCO2, Ven 28.9 (*)    Bicarbonate 17.9 (*)    TCO2 19 (*)    Acid-base deficit 6.0 (*)    Calcium, Ion 1.14 (*)    HCT 27.0 (*)    Hemoglobin 9.2 (*)    All other components within normal limits  LIPASE, BLOOD  BLOOD GAS, VENOUS  I-STAT BETA HCG BLOOD, ED (MC, WL, AP ONLY)    EKG None   Date: 01/31/2020  Rate: 89  Rhythm: normal sinus rhythm  QRS Axis: normal  Intervals: normal  ST/T Wave abnormalities: normal  Conduction Disutrbances: none  Narrative Interpretation:   Old EKG Reviewed: No significant changes noted     Radiology No results found.  Procedures Procedures (including critical care time)  Medications Ordered in ED Medications  ondansetron (ZOFRAN-ODT) disintegrating tablet 4 mg (4 mg Oral Incomplete 01/31/20 0643)  sodium chloride 0.9 % bolus 1,000 mL (1,000 mLs Intravenous New Bag/Given 01/31/20 0832)  haloperidol lactate (HALDOL) injection 5 mg (5 mg Intravenous Given 01/31/20 0837)  labetalol (NORMODYNE)  injection 10 mg (10 mg Intravenous Given 01/31/20 8119)    ED Course  I have reviewed the triage vital signs and the nursing notes.  Pertinent labs & imaging results that were available during my care of the patient were reviewed by me and considered in my medical decision making (see chart for details).    MDM Rules/Calculators/A&P                          BP (!) 215/81   Pulse 76   Temp 98.1 F (36.7 C) (Oral)   Resp 14   SpO2 100%   Final Clinical Impression(s) / ED Diagnoses Final diagnoses:  Non-intractable vomiting with nausea, unspecified vomiting type  Primary hypertension    Rx / DC Orders ED Discharge Orders         Ordered    amLODipine (NORVASC) 10 MG tablet  Daily        01/31/20 1033    hydrALAZINE (APRESOLINE) 50 MG tablet  3 times daily        01/31/20 1033    ondansetron (ZOFRAN ODT) 4 MG disintegrating tablet  Every 8 hours PRN        01/31/20 1033    pantoprazole (PROTONIX) 40 MG tablet  Daily        01/31/20 1033         8:26 AM Patient here with persistent nausea vomiting ongoing for the past 3 days. Admits to heavy marijuana use. Examination reveals a fairly soft and nontender abdomen however patient is diaphoretic and actively vomiting. I suspect this is cannabinoid hyperemesis  syndrome. We'll treat with Haldol and IV fluid if EKG is without signs of prolonged QT. Patient was found to be hypertensive with blood pressure 215/114. She has not had her blood pressure medication in quite a while she was on amlodipine and hydralazine.  10:29 AM After receiving Haldol and IV fluid patient reports feeling much better at this time she request to be discharged.  Her blood pressure remains elevated at 215/81 likely secondary to noncompliant on blood pressure medication as well as her current cannabinol hyperemesis syndrome.  Doubt cardiac source, doubt hyper tensive emergency.  Labs are significant for signs of chronic kidney disease with BUN 27, creatinine  2.62 improved from prior.  CBG is 260 with normal anion gap.  Her hemoglobin is 8.3, baseline is around 9 she denies any active bleeding, pregnancy test is negative, EKG unremarkable.  No evidence of prolonged QT.  Patient does not want to stay any longer and can tolerate p.o.  Encourage patient to avoid marijuana use as it can contribute to his symptoms.  I will also prescribe her blood pressure medication and recommend outpatient follow-up.  Return precaution discussed.   Domenic Moras, PA-C 01/31/20 1035    Lucrezia Starch, MD 02/01/20 [phone]

## 2020-01-31 NOTE — ED Triage Notes (Signed)
Pt comes from home via [facility] for n/v and abd pain for the past 3 days. Vomiting in triage.

## 2020-01-31 NOTE — Discharge Instructions (Signed)
You have been evaluated for your symptoms.  It is likely due to marijuana use causing nausea and vomiting.  Please avoid marijuana use as it may worsen your symptoms.  Your blood pressure is elevated today, take blood pressure medication but follow-up closely with your primary care doctor for recheck.  Return if you have any concern.

## 2020-02-05 ENCOUNTER — Other Ambulatory Visit: Payer: Self-pay

## 2020-02-05 ENCOUNTER — Encounter (HOSPITAL_COMMUNITY): Payer: Self-pay

## 2020-02-05 ENCOUNTER — Inpatient Hospital Stay (HOSPITAL_COMMUNITY)
Admission: EM | Admit: 2020-02-05 | Discharge: 2020-02-08 | DRG: 683 | Disposition: A | Discharge status: Home / Self Care | Payer: Medicaid Other | Source: Ambulatory Visit | Attending: Internal Medicine | Admitting: Internal Medicine

## 2020-02-05 ENCOUNTER — Ambulatory Visit
Admission: EM | Admit: 2020-02-05 | Discharge: 2020-02-05 | Disposition: A | Discharge status: Home / Self Care | Payer: Medicaid Other

## 2020-02-05 DIAGNOSIS — D509 Iron deficiency anemia, unspecified: Secondary | ICD-10-CM | POA: Diagnosis present

## 2020-02-05 DIAGNOSIS — Z9114 Patient's other noncompliance with medication regimen: Secondary | ICD-10-CM | POA: Diagnosis not present

## 2020-02-05 DIAGNOSIS — J45909 Unspecified asthma, uncomplicated: Secondary | ICD-10-CM | POA: Diagnosis present

## 2020-02-05 DIAGNOSIS — Z87892 Personal history of anaphylaxis: Secondary | ICD-10-CM

## 2020-02-05 DIAGNOSIS — Z79899 Other long term (current) drug therapy: Secondary | ICD-10-CM | POA: Diagnosis not present

## 2020-02-05 DIAGNOSIS — E860 Dehydration: Secondary | ICD-10-CM | POA: Diagnosis present

## 2020-02-05 DIAGNOSIS — T383X6A Underdosing of insulin and oral hypoglycemic [antidiabetic] drugs, initial encounter: Secondary | ICD-10-CM | POA: Diagnosis present

## 2020-02-05 DIAGNOSIS — I959 Hypotension, unspecified: Secondary | ICD-10-CM

## 2020-02-05 DIAGNOSIS — Z20822 Contact with and (suspected) exposure to covid-19: Secondary | ICD-10-CM | POA: Diagnosis present

## 2020-02-05 DIAGNOSIS — Z794 Long term (current) use of insulin: Secondary | ICD-10-CM

## 2020-02-05 DIAGNOSIS — E1165 Type 2 diabetes mellitus with hyperglycemia: Secondary | ICD-10-CM | POA: Diagnosis present

## 2020-02-05 DIAGNOSIS — R112 Nausea with vomiting, unspecified: Secondary | ICD-10-CM | POA: Diagnosis present

## 2020-02-05 DIAGNOSIS — Z91128 Patient's intentional underdosing of medication regimen for other reason: Secondary | ICD-10-CM | POA: Diagnosis not present

## 2020-02-05 DIAGNOSIS — F1299 Cannabis use, unspecified with unspecified cannabis-induced disorder: Secondary | ICD-10-CM | POA: Diagnosis present

## 2020-02-05 DIAGNOSIS — E1143 Type 2 diabetes mellitus with diabetic autonomic (poly)neuropathy: Secondary | ICD-10-CM | POA: Diagnosis present

## 2020-02-05 DIAGNOSIS — R42 Dizziness and giddiness: Secondary | ICD-10-CM | POA: Diagnosis not present

## 2020-02-05 DIAGNOSIS — E1122 Type 2 diabetes mellitus with diabetic chronic kidney disease: Secondary | ICD-10-CM | POA: Diagnosis present

## 2020-02-05 DIAGNOSIS — Z91013 Allergy to seafood: Secondary | ICD-10-CM | POA: Diagnosis not present

## 2020-02-05 DIAGNOSIS — I129 Hypertensive chronic kidney disease with stage 1 through stage 4 chronic kidney disease, or unspecified chronic kidney disease: Secondary | ICD-10-CM | POA: Diagnosis present

## 2020-02-05 DIAGNOSIS — Z885 Allergy status to narcotic agent status: Secondary | ICD-10-CM

## 2020-02-05 DIAGNOSIS — N184 Chronic kidney disease, stage 4 (severe): Secondary | ICD-10-CM | POA: Diagnosis present

## 2020-02-05 DIAGNOSIS — Z8249 Family history of ischemic heart disease and other diseases of the circulatory system: Secondary | ICD-10-CM

## 2020-02-05 DIAGNOSIS — E872 Acidosis: Secondary | ICD-10-CM | POA: Diagnosis present

## 2020-02-05 DIAGNOSIS — N189 Chronic kidney disease, unspecified: Secondary | ICD-10-CM | POA: Diagnosis present

## 2020-02-05 DIAGNOSIS — N179 Acute kidney failure, unspecified: Secondary | ICD-10-CM | POA: Diagnosis present

## 2020-02-05 DIAGNOSIS — D631 Anemia in chronic kidney disease: Secondary | ICD-10-CM | POA: Diagnosis present

## 2020-02-05 DIAGNOSIS — I951 Orthostatic hypotension: Secondary | ICD-10-CM | POA: Diagnosis present

## 2020-02-05 DIAGNOSIS — Z833 Family history of diabetes mellitus: Secondary | ICD-10-CM

## 2020-02-05 DIAGNOSIS — K3184 Gastroparesis: Secondary | ICD-10-CM | POA: Diagnosis present

## 2020-02-05 LAB — BLOOD GAS, VENOUS
Acid-base deficit: 2.6 mmol/L — ABNORMAL HIGH (ref 0.0–2.0)
Bicarbonate: 22 mmol/L (ref 20.0–28.0)
FIO2: 21
O2 Saturation: 79.7 %
Patient temperature: 36.9
pCO2, Ven: 39.4 mmHg — ABNORMAL LOW (ref 44.0–60.0)
pH, Ven: 7.364 (ref 7.250–7.430)
pO2, Ven: 51.2 mmHg — ABNORMAL HIGH (ref 32.0–45.0)

## 2020-02-05 LAB — CBC WITH DIFFERENTIAL/PLATELET
Abs Immature Granulocytes: 0.03 10*3/uL (ref 0.00–0.07)
Basophils Absolute: 0 10*3/uL (ref 0.0–0.1)
Basophils Relative: 1 %
Eosinophils Absolute: 0 10*3/uL (ref 0.0–0.5)
Eosinophils Relative: 0 %
HCT: 32.8 % — ABNORMAL LOW (ref 36.0–46.0)
Hemoglobin: 9.9 g/dL — ABNORMAL LOW (ref 12.0–15.0)
Immature Granulocytes: 0 %
Lymphocytes Relative: 32 %
Lymphs Abs: 2.7 10*3/uL (ref 0.7–4.0)
MCH: 22.8 pg — ABNORMAL LOW (ref 26.0–34.0)
MCHC: 30.2 g/dL (ref 30.0–36.0)
MCV: 75.6 fL — ABNORMAL LOW (ref 80.0–100.0)
Monocytes Absolute: 0.6 10*3/uL (ref 0.1–1.0)
Monocytes Relative: 7 %
Neutro Abs: 5.2 10*3/uL (ref 1.7–7.7)
Neutrophils Relative %: 60 %
Platelets: 395 10*3/uL (ref 150–400)
RBC: 4.34 MIL/uL (ref 3.87–5.11)
RDW: 16.1 % — ABNORMAL HIGH (ref 11.5–15.5)
WBC: 8.6 10*3/uL (ref 4.0–10.5)
nRBC: 0 % (ref 0.0–0.2)

## 2020-02-05 LAB — COMPREHENSIVE METABOLIC PANEL
ALT: 9 U/L (ref 0–44)
AST: 24 U/L (ref 15–41)
Albumin: 3.2 g/dL — ABNORMAL LOW (ref 3.5–5.0)
Alkaline Phosphatase: 77 U/L (ref 38–126)
Anion gap: 20 — ABNORMAL HIGH (ref 5–15)
BUN: 72 mg/dL — ABNORMAL HIGH (ref 6–20)
CO2: 14 mmol/L — ABNORMAL LOW (ref 22–32)
Calcium: 8.6 mg/dL — ABNORMAL LOW (ref 8.9–10.3)
Chloride: 95 mmol/L — ABNORMAL LOW (ref 98–111)
Creatinine, Ser: 5.46 mg/dL — ABNORMAL HIGH (ref 0.44–1.00)
GFR, Estimated: 9 mL/min — ABNORMAL LOW (ref >60)
Glucose, Bld: 219 mg/dL — ABNORMAL HIGH (ref 70–99)
Potassium: 4.7 mmol/L (ref 3.5–5.1)
Sodium: 129 mmol/L — ABNORMAL LOW (ref 135–145)
Total Bilirubin: 1.2 mg/dL (ref 0.3–1.2)
Total Protein: 7.5 g/dL (ref 6.5–8.1)

## 2020-02-05 LAB — RESP PANEL BY RT-PCR (FLU A&B, COVID) ARPGX2
Influenza A by PCR: NEGATIVE
Influenza B by PCR: NEGATIVE
SARS Coronavirus 2 by RT PCR: NEGATIVE

## 2020-02-05 LAB — GLUCOSE, CAPILLARY: Glucose-Capillary: 137 mg/dL — ABNORMAL HIGH (ref 70–99)

## 2020-02-05 LAB — CBG MONITORING, ED
Glucose-Capillary: 240 mg/dL — ABNORMAL HIGH (ref 70–99)
Glucose-Capillary: 194 mg/dL — ABNORMAL HIGH (ref 70–99)

## 2020-02-05 LAB — HEMOGLOBIN A1C
Hgb A1c MFr Bld: 7.3 % — ABNORMAL HIGH (ref 4.8–5.6)
Mean Plasma Glucose: 162.81 mg/dL

## 2020-02-05 LAB — LIPASE, BLOOD: Lipase: 35 U/L (ref 11–51)

## 2020-02-05 MED ORDER — STERILE WATER FOR INJECTION IV SOLN
INTRAVENOUS | Status: DC
  Filled 2020-02-05 (×4): qty 9.62

## 2020-02-05 MED ORDER — ALUM & MAG HYDROXIDE-SIMETH 200-200-20 MG/5ML PO SUSP
30.0000 mL | Freq: Once | ORAL | Status: AC
  Administered 2020-02-05: 30 mL via ORAL
  Filled 2020-02-05: qty 30

## 2020-02-05 MED ORDER — ACETAMINOPHEN 650 MG RE SUPP: 650.0000 mg | Freq: Four times a day (QID) | RECTAL | Status: DC | PRN

## 2020-02-05 MED ORDER — SODIUM CHLORIDE 0.9 % IV BOLUS
1000.0000 mL | Freq: Once | INTRAVENOUS | Status: AC
  Administered 2020-02-05: 1000 mL via INTRAVENOUS

## 2020-02-05 MED ORDER — PANTOPRAZOLE SODIUM 40 MG PO TBEC
40.0000 mg | DELAYED_RELEASE_TABLET | Freq: Every day | ORAL | Status: DC
  Administered 2020-02-05 – 2020-02-08 (×4): 40 mg via ORAL
  Filled 2020-02-05 (×4): qty 1

## 2020-02-05 MED ORDER — LABETALOL HCL 100 MG PO TABS: 100.0000 mg | ORAL_TABLET | Freq: Three times a day (TID) | ORAL | Status: DC | PRN

## 2020-02-05 MED ORDER — ONDANSETRON HCL 4 MG/2ML IJ SOLN
4.0000 mg | Freq: Once | INTRAMUSCULAR | Status: AC
  Administered 2020-02-05: 4 mg via INTRAVENOUS
  Filled 2020-02-05: qty 2

## 2020-02-05 MED ORDER — ALPRAZOLAM 0.25 MG PO TABS
0.2500 mg | ORAL_TABLET | Freq: Three times a day (TID) | ORAL | Status: DC | PRN
  Administered 2020-02-06 – 2020-02-08 (×5): 0.25 mg via ORAL
  Filled 2020-02-05 (×5): qty 1

## 2020-02-05 MED ORDER — HEPARIN SODIUM (PORCINE) 5000 UNIT/ML IJ SOLN
5000.0000 [IU] | Freq: Two times a day (BID) | INTRAMUSCULAR | Status: DC
  Administered 2020-02-05 – 2020-02-08 (×7): 5000 [IU] via SUBCUTANEOUS
  Filled 2020-02-05 (×7): qty 1

## 2020-02-05 MED ORDER — ACETAMINOPHEN 325 MG PO TABS: 650.0000 mg | ORAL_TABLET | Freq: Four times a day (QID) | ORAL | Status: DC | PRN

## 2020-02-05 MED ORDER — LORAZEPAM 2 MG/ML IJ SOLN
0.5000 mg | Freq: Once | INTRAMUSCULAR | Status: AC
  Administered 2020-02-05: 0.5 mg via INTRAVENOUS
  Filled 2020-02-05: qty 1

## 2020-02-05 MED ORDER — PROMETHAZINE HCL 25 MG/ML IJ SOLN: 25.0000 mg | Freq: Once | INTRAMUSCULAR | Status: DC

## 2020-02-05 MED ORDER — INSULIN ASPART 100 UNIT/ML ~~LOC~~ SOLN: 0.0000 [IU] | Freq: Every day | SUBCUTANEOUS | Status: DC

## 2020-02-05 MED ORDER — PROMETHAZINE HCL 25 MG/ML IJ SOLN
12.5000 mg | Freq: Once | INTRAMUSCULAR | Status: DC
  Filled 2020-02-05: qty 1

## 2020-02-05 MED ORDER — SODIUM CHLORIDE 0.9 % IV BOLUS
2000.0000 mL | Freq: Once | INTRAVENOUS | Status: AC
  Administered 2020-02-05: 2000 mL via INTRAVENOUS

## 2020-02-05 MED ORDER — INSULIN ASPART 100 UNIT/ML ~~LOC~~ SOLN
0.0000 [IU] | Freq: Three times a day (TID) | SUBCUTANEOUS | Status: DC
  Administered 2020-02-05: 2 [IU] via SUBCUTANEOUS
  Administered 2020-02-06: 3 [IU] via SUBCUTANEOUS
  Administered 2020-02-06 – 2020-02-07 (×2): 1 [IU] via SUBCUTANEOUS
  Administered 2020-02-07 – 2020-02-08 (×3): 2 [IU] via SUBCUTANEOUS

## 2020-02-05 MED ORDER — METOCLOPRAMIDE HCL 5 MG/ML IJ SOLN
10.0000 mg | Freq: Four times a day (QID) | INTRAMUSCULAR | Status: DC
  Administered 2020-02-05 – 2020-02-06 (×3): 10 mg via INTRAVENOUS
  Filled 2020-02-05 (×3): qty 2

## 2020-02-05 NOTE — ED Notes (Signed)
Attempted call report

## 2020-02-05 NOTE — Discharge Instructions (Addendum)
To ED via EMS

## 2020-02-05 NOTE — ED Notes (Signed)
Attempted to call report

## 2020-02-05 NOTE — ED Triage Notes (Signed)
Pt from [facility] with EMS for further evaluation for dizziness and anxiety for the past 4 days since getting prescribed amlodipine and hydralazine for HTN. Pt very anxious upon arrival to ED. Denies any chest pain or sob. VSS

## 2020-02-05 NOTE — H&P (Signed)
History and Physical    [name] ZLD:[id number] DOB: [birth year] DOA: 02/05/2020  PCP: Patient, No Pcp Per (Confirm with patient/family/NH records and if not entered, this has to be entered at [facility] point of entry) Patient coming from: Home  I have personally briefly reviewed patient's old medical records in [place]  Chief Complaint: Feeling dizzy  HPI: [name] is a 40 y.o. female with medical history significant of IDDM, not compliant with insulin regimen, HTN, history of frequent nauseous vomiting with a suspicious diagnosis of cannabinoid hyperemesis, anxiety disorder CKD stage IV, presented with recurrent nausea and vomiting and feeling dizziness.  Patient started to feel nauseous and frequent stomach content vomiting 6 days ago, gradually getting worse, came to ED 4 days ago was found to have dehydration as well as significant elevation of blood pressure.  Patient was treated with supportive care and discharged home with amlodipine and hydralazine for her blood pressure.  Patient however claimed that she did not tolerated amlodipine or hydralazine well.  And she has been only doing once a day Lantus for her glucose and her random glucose check at home always >200 recent months.  She has been feeling weak and throwing up BP meds, and was not able to eat or drink for the last 3 days.  And came back to ED today.  Denies any diarrhea no abdominal pain no fever chills. ED Course: Patient was found to have severe dehydration, orthostatic vital signs positive.  Blood work showed AKI on CKD bicarb 14, creatinine 5.4 potassium 4.7. Review of Systems: As per HPI otherwise 14 point review of systems negative.    Past Medical History:  Diagnosis Date  . Anxiety   . Asthma   . Depression   . Diabetes mellitus   . Hyperemesis arising during pregnancy   . Hypertension   . Mental disorder     Past Surgical History:  Procedure Laterality Date  . ESOPHAGOGASTRODUODENOSCOPY (EGD)  WITH PROPOFOL N/A 08/07/2019   Procedure: ESOPHAGOGASTRODUODENOSCOPY (EGD) WITH PROPOFOL;  Surgeon: Irene Shipper, MD;  Location: [facility] ENDOSCOPY;  Service: Endoscopy;  Laterality: N/A;  . NO PAST SURGERIES    . TUBAL LIGATION  06/20/2011   Procedure: POST PARTUM TUBAL LIGATION;  Surgeon: Mora Bellman, MD;  Location: [facility];  Service: Gynecology;  Laterality: Bilateral;  Bilateral post partum tubal ligation     reports that she has never smoked. She has never used smokeless tobacco. She reports that she does not drink alcohol and does not use drugs.  Allergies  Allergen Reactions  . Shellfish Allergy Anaphylaxis and Swelling  . Morphine And Related Nausea And Vomiting    Family History  Problem Relation Age of Onset  . Heart disease Mother        CHF  . Diabetes Sister   . Anesthesia problems Neg Hx      Prior to Admission medications   Medication Sig Start Date End Date Taking? Authorizing Provider  amLODipine (NORVASC) 10 MG tablet Take 1 tablet (10 mg total) by mouth daily. 01/31/20  Yes Domenic Moras, PA-C  hydrALAZINE (APRESOLINE) 50 MG tablet Take 1 tablet (50 mg total) by mouth 3 (three) times daily. 01/31/20 03/01/20 Yes Domenic Moras, PA-C  ferrous sulfate 325 (65 FE) MG tablet Take 1 tablet (325 mg total) by mouth 3 (three) times daily with meals. Patient not taking: Reported on 02/05/2020 02/18/16   Velvet Bathe, MD  insulin aspart (NOVOLOG) 100 UNIT/ML injection Before each meal 3 times a  day , 140-199 - 2 units, 200-250 - 4 units, 251-299 - 6 units,  300-349 - 8 units,  350 or above 10 units. Insulin PEN if approved, provide syringes and needles if needed. Can switch to any other cheaper alternative. Patient not taking: Reported on 02/05/2020 08/25/18   Thurnell Lose, MD  insulin glargine (LANTUS) 100 UNIT/ML injection Inject 0.1 mLs (10 Units total) into the skin at bedtime. Patient not taking: Reported on 02/05/2020 08/08/19   Terrilee Croak, MD  Insulin Syringe-Needle U-100 25G X  1" 1 ML MISC For 4 times a day insulin SQ, 1 month supply. Diagnosis E11.65 08/25/18   Thurnell Lose, MD  ondansetron (ZOFRAN ODT) 4 MG disintegrating tablet Take 1 tablet (4 mg total) by mouth every 8 (eight) hours as needed for nausea or vomiting. Patient not taking: Reported on 02/05/2020 01/31/20   Domenic Moras, PA-C  pantoprazole (PROTONIX) 40 MG tablet Take 1 tablet (40 mg total) by mouth daily. Patient not taking: Reported on 02/05/2020 01/31/20 03/01/20  Domenic Moras, PA-C    Physical Exam: Vitals:   02/05/20 1048 02/05/20 1051 02/05/20 1220 02/05/20 1400  BP:  (!) 141/97 (!) 168/112 130/86  Pulse:  (!) 113 (!) 110 (!) 124  Resp:  (!) 24 13 15   Temp:  98.3 F (36.8 C)    TempSrc:  Oral    SpO2:  100% 100% 100%  Weight: 90.1 kg     Height: 5\' 7"  (1.702 m)       Constitutional: NAD, calm, comfortable Vitals:   02/05/20 1048 02/05/20 1051 02/05/20 1220 02/05/20 1400  BP:  (!) 141/97 (!) 168/112 130/86  Pulse:  (!) 113 (!) 110 (!) 124  Resp:  (!) 24 13 15   Temp:  98.3 F (36.8 C)    TempSrc:  Oral    SpO2:  100% 100% 100%  Weight: 90.1 kg     Height: 5\' 7"  (1.702 m)      Eyes: PERRL, lids and conjunctivae normal ENMT: Mucous membranes are dry. Posterior pharynx clear of any exudate or lesions.Normal dentition.  Neck: normal, supple, no masses, no thyromegaly Respiratory: clear to auscultation bilaterally, no wheezing, no crackles. Normal respiratory effort. No accessory muscle use.  Cardiovascular: Regular rate and rhythm, no murmurs / rubs / gallops. No extremity edema. 2+ pedal pulses. No carotid bruits.  Abdomen: no tenderness, no masses palpated. No hepatosplenomegaly. Bowel sounds positive.  Musculoskeletal: no clubbing / cyanosis. No joint deformity upper and lower extremities. Good ROM, no contractures. Normal muscle tone.  Skin: no rashes, lesions, ulcers. No induration Neurologic: CN 2-12 grossly intact. Sensation intact, DTR normal. Strength 5/5 in all 4.   Psychiatric: Normal judgment and insight. Alert and oriented x 3. Normal mood.     Labs on Admission: I have personally reviewed following labs and imaging studies  CBC: Recent Labs  Lab 01/31/20 0841 01/31/20 0846 02/05/20 1241  WBC  --  9.1 8.6  NEUTROABS  --   --  5.2  HGB 9.2* 8.3* 9.9*  HCT 27.0* 28.3* 32.8*  MCV  --  76.7* 75.6*  PLT  --  307 846   Basic Metabolic Panel: Recent Labs  Lab 01/31/20 0841 01/31/20 0846 02/05/20 1241  NA 138 135 129*  K 4.9 4.9 4.7  CL  --  106 95*  CO2  --  17* 14*  GLUCOSE  --  260* 219*  BUN  --  27* 72*  CREATININE  --  2.62* 5.46*  CALCIUM  --  9.1 8.6*   GFR: Estimated Creatinine Clearance: 15.5 mL/min (A) (by C-G formula based on SCr of 5.46 mg/dL (H)). Liver Function Tests: Recent Labs  Lab 01/31/20 0846 02/05/20 1241  AST 13* 24  ALT 10 9  ALKPHOS 75 77  BILITOT 0.3 1.2  PROT 7.5 7.5  ALBUMIN 3.0* 3.2*   Recent Labs  Lab 01/31/20 0846 02/05/20 1241  LIPASE 43 35   No results for input(s): AMMONIA in the last 168 hours. Coagulation Profile: No results for input(s): INR, PROTIME in the last 168 hours. Cardiac Enzymes: No results for input(s): CKTOTAL, CKMB, CKMBINDEX, TROPONINI in the last 168 hours. BNP (last 3 results) No results for input(s): PROBNP in the last 8760 hours. HbA1C: No results for input(s): HGBA1C in the last 72 hours. CBG: Recent Labs  Lab 02/05/20 1105  GLUCAP 240*   Lipid Profile: No results for input(s): CHOL, HDL, LDLCALC, TRIG, CHOLHDL, LDLDIRECT in the last 72 hours. Thyroid Function Tests: No results for input(s): TSH, T4TOTAL, FREET4, T3FREE, THYROIDAB in the last 72 hours. Anemia Panel: No results for input(s): VITAMINB12, FOLATE, FERRITIN, TIBC, IRON, RETICCTPCT in the last 72 hours. Urine analysis:    Component Value Date/Time   COLORURINE STRAW (A) 01/31/2020 0923   APPEARANCEUR CLEAR 01/31/2020 0923   LABSPEC 1.008 01/31/2020 0923   PHURINE 7.0 01/31/2020 0923    GLUCOSEU >=500 (A) 01/31/2020 0923   HGBUR NEGATIVE 01/31/2020 0923   BILIRUBINUR NEGATIVE 01/31/2020 0923   KETONESUR NEGATIVE 01/31/2020 0923   PROTEINUR >=300 (A) 01/31/2020 0923   UROBILINOGEN 1.0 08/11/2013 1128   NITRITE NEGATIVE 01/31/2020 0923   LEUKOCYTESUR NEGATIVE 01/31/2020 0923    Radiological Exams on Admission: No results found.  EKG: Independently reviewed.  Sinus tachycardia, no acute ST changes Assessment/Plan Active Problems:   Nausea & vomiting   AKI (acute kidney injury) ([place])  (please populate well all problems here in Problem List. (For example, if patient is on BP meds at home and you resume or decide to hold them, it is a problem that needs to be her. Same for CAD, COPD, HLD and so on)  AKI on CKD stage IV -Severe dehydration from frequent nauseous vomiting, and poor p.o. intake -Received IV boluses in ED, will continue IVF wit hBicarb drip. -No symptoms signs of bruising dialysis  High anion gap and non-anion gap metabolic acidosis -From starvation and severe dehydration and nauseous vomited -Received 3 L IVF boluses in ED, continue bicarb drip x24 hours  Frequent nauseous and vomit -Suspect recurrent, cannabinoid hyperemesis, but underlying gastroparesis cannot be ruled out. -Trial of Reglan every 6 hours x4 doses then switch to as needed.  IDDM with hyperglycemia -Noncompliant with insulin -Sliding scale for now given significant kidney injury  HTN -With orthostatic hypotension secondary to severe dehydration -Home BP for now, as needed labetalol.  Chronic normocytic anemia -H&H stable, no symptoms or signs of bleeding -Likely secondary to CKD.   DVT prophylaxis: Heparin subcu  code Status: Full Code Family Communication: None at bedside Disposition Plan: Expect 1 to 2 days hospital stay for hydration/rehab treatment as well as symptom management of frequent nausea with vomiting. Consults called: None Admission status: MedSurg with  telemetry x24 hours then reevaluate.   Lequita Halt MD [facility] Pager [phone]  02/05/2020, 2:59 PM

## 2020-02-05 NOTE — ED Notes (Signed)
This RN attempted IV access twice without success

## 2020-02-05 NOTE — ED Triage Notes (Signed)
Patient states she feels dizzy and is not sleeping for 5 days. Pt states she got dizzy and fell and hit her head last night and her jaw hurts. Pt did not lose consciousness during the incident. Pt is aox4 and is diagnosed with anxiety 8 years but is not on medication.

## 2020-02-05 NOTE — ED Provider Notes (Signed)
[facility]    CSN: [id number] Arrival date & time: 02/05/20  0848      History   Chief Complaint Chief Complaint  Patient presents with  . Dizziness    X 5 days    HPI [name] is a 40 y.o. female history of asthma, DM type II, hypertension, presenting today for evaluation of dizziness.  Reports that over the past 4 to 5 days she has had persistent dizziness described as lightheadedness/presyncope as well as sensation of spinning.  She has had associated headache.  She reports episode of feeling dizzy last night falling and hitting her head, but denies loss of consciousness.  Denies any blood thinners.  She denies worsening of headache or dizziness since fall.  She was recently seen at the emergency room approximately 6 days ago on 1/13 for persistent nausea and vomiting, attributed to hyperemesis from cannabis use, had significantly elevated blood pressure at 213/81, was restarted on amlodipine and hydralazine.  Patient does feel her symptoms began after restarting these medicines.  Blood pressure today initially 112/71, patient feeling very dizzy and blood pressure was rechecked and was 79/54 on initial attempt, 86/59 on second attempt.  Patient reports that she has had very poor oral intake over the past week, has been attempting to drink fluids, but will frequently vomit them back up.  Denies any solids.  Patient reports symptoms worse with position changes.  Last episode of vomiting this morning.  HPI  Past Medical History:  Diagnosis Date  . Anxiety   . Asthma   . Depression   . Diabetes mellitus   . Hyperemesis arising during pregnancy   . Hypertension   . Mental disorder     Patient Active Problem List   Diagnosis Date Noted  . Abnormal CT of the abdomen   . Acute esophagitis   . Nausea & vomiting 08/04/2019  . Intractable nausea and vomiting 08/03/2019  . ARF (acute renal failure) ([place]) 08/24/2018  . Uncontrolled type 2 diabetes mellitus with  hyperglycemia ([place]) 08/24/2018  . DKA (diabetic ketoacidoses) 08/24/2018  . Non-intractable vomiting   . Sepsis ([place]) 02/15/2016  . Generalized abdominal pain 02/15/2016  . CAP (community acquired pneumonia) 02/14/2016  . 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 Back pain 07/28/2011  . H. pylori infection 11/24/2010  . Depression 11/20/2010  . Asthma 11/20/2010  . Chronic hypertension in pregnancy 11/08/2010  . Type 2 diabetes mellitus ([place]) 11/07/2010    Past Surgical History:  Procedure Laterality Date  . ESOPHAGOGASTRODUODENOSCOPY (EGD) WITH PROPOFOL N/A 08/07/2019   Procedure: ESOPHAGOGASTRODUODENOSCOPY (EGD) WITH PROPOFOL;  Surgeon: Irene Shipper, MD;  Location: [facility] ENDOSCOPY;  Service: Endoscopy;  Laterality: N/A;  . NO PAST SURGERIES    . TUBAL LIGATION  06/20/2011   Procedure: POST PARTUM TUBAL LIGATION;  Surgeon: Mora Bellman, MD;  Location: [facility];  Service: Gynecology;  Laterality: Bilateral;  Bilateral post partum tubal ligation    OB History    Gravida  4   Para  3   Term  2   Preterm  1   AB  1   Living  3     SAB  0   IAB  0   Ectopic  1   Multiple  0   Live Births  3            Home Medications    Prior to Admission medications   Medication Sig Start Date End Date Taking? Authorizing Provider  amLODipine (NORVASC)  10 MG tablet Take 1 tablet (10 mg total) by mouth daily. 01/31/20  Yes Domenic Moras, PA-C  ferrous sulfate 325 (65 FE) MG tablet Take 1 tablet (325 mg total) by mouth 3 (three) times daily with meals. 02/18/16  Yes Velvet Bathe, MD  hydrALAZINE (APRESOLINE) 50 MG tablet Take 1 tablet (50 mg total) by mouth 3 (three) times daily. 01/31/20 03/01/20 Yes Domenic Moras, PA-C  ondansetron (ZOFRAN ODT) 4 MG disintegrating tablet Take 1 tablet (4 mg total) by mouth every 8 (eight) hours as needed for nausea or vomiting. 01/31/20  Yes Domenic Moras, PA-C  pantoprazole (PROTONIX) 40 MG tablet Take 1 tablet (40 mg total) by mouth daily. 01/31/20 03/01/20  Yes Domenic Moras, PA-C  insulin aspart (NOVOLOG) 100 UNIT/ML injection Before each meal 3 times a day , 140-199 - 2 units, 200-250 - 4 units, 251-299 - 6 units,  300-349 - 8 units,  350 or above 10 units. Insulin PEN if approved, provide syringes and needles if needed. Can switch to any other cheaper alternative. Patient taking differently: Inject 2-10 Units into the skin See admin instructions. Before each meal 3 times a day , 140-199 - 2 units, 200-250 - 4 units, 251-299 - 6 units,  300-349 - 8 units,  350 or above 10 units. 08/25/18   Thurnell Lose, MD  insulin glargine (LANTUS) 100 UNIT/ML injection Inject 0.1 mLs (10 Units total) into the skin at bedtime. 08/08/19   Dahal, Marlowe Aschoff, MD  Insulin Syringe-Needle U-100 25G X 1" 1 ML MISC For 4 times a day insulin SQ, 1 month supply. Diagnosis E11.65 08/25/18   Thurnell Lose, MD    Family History Family History  Problem Relation Age of Onset  . Heart disease Mother        CHF  . Diabetes Sister   . Anesthesia problems Neg Hx     Social History Social History   Tobacco Use  . Smoking status: Never Smoker  . Smokeless tobacco: Never Used  Vaping Use  . Vaping Use: Never used  Substance Use Topics  . Alcohol use: No    Comment: occassionally  . Drug use: No    Types: Cocaine, Marijuana     Allergies   Shellfish allergy and Morphine and related   Review of Systems Review of Systems  Constitutional: Negative for fatigue and fever.  HENT: Negative for congestion, sinus pressure and sore throat.   Eyes: Positive for photophobia. Negative for pain and visual disturbance.  Respiratory: Negative for cough and shortness of breath.   Cardiovascular: Negative for chest pain.  Gastrointestinal: Positive for nausea. Negative for abdominal pain and vomiting.  Genitourinary: Negative for decreased urine volume and hematuria.  Musculoskeletal: Negative for myalgias, neck pain and neck stiffness.  Neurological: Positive for dizziness,  light-headedness and headaches. Negative for syncope, facial asymmetry, speech difficulty, weakness and numbness.     Physical Exam Triage Vital Signs ED Triage Vitals  Enc Vitals Group     BP 02/05/20 0904 112/71     Pulse Rate 02/05/20 0904 (!) 118     Resp 02/05/20 0904 19     Temp 02/05/20 0904 98 F (36.7 C)     Temp Source 02/05/20 0904 Oral     SpO2 02/05/20 0904 96 %     Weight --      Height --      Head Circumference --      Peak Flow --      Pain Score 02/05/20  8938 10     Pain Loc --      Pain Edu? --      Excl. in [place]? --    No data found.  Updated Vital Signs BP 130/85 (BP Location: Right Arm)   Pulse (!) 114   Temp 98 F (36.7 C) (Oral)   Resp 19   SpO2 98%   Visual Acuity Right Eye Distance:   Left Eye Distance:   Bilateral Distance:    Right Eye Near:   Left Eye Near:    Bilateral Near:     Physical Exam Vitals and nursing note reviewed.  Constitutional:      Appearance: She is well-developed and well-nourished.     Comments: Patient uncomfortable, cannot sit still, has difficulty following tasks for extended periods of time  HENT:     Head: Normocephalic and atraumatic.     Ears:     Comments: Bilateral ears without tenderness to palpation of external auricle, tragus and mastoid, EAC's without erythema or swelling, TM's with good bony landmarks and cone of light. Non erythematous.     Nose: Nose normal.     Mouth/Throat:     Comments: Oral mucosa pink and moist, no tonsillar enlargement or exudate. Posterior pharynx patent and nonerythematous, no uvula deviation or swelling. Normal phonation. Eyes:     Extraocular Movements: Extraocular movements intact.     Conjunctiva/sclera: Conjunctivae normal.     Pupils: Pupils are equal, round, and reactive to light.  Cardiovascular:     Rate and Rhythm: Regular rhythm. Tachycardia present.  Pulmonary:     Effort: Pulmonary effort is normal. No respiratory distress.     Comments: Slight  tachypnea, end expiratory wheezing noted through bilateral lung fields Abdominal:     General: There is no distension.  Musculoskeletal:        General: Normal range of motion.     Cervical back: Neck supple.  Skin:    General: Skin is warm and dry.  Neurological:     Mental Status: She is alert and oriented to person, place, and time.     Comments: Asymmetric, moving all extremities appropriately  Psychiatric:        Mood and Affect: Mood and affect normal.      UC Treatments / Results  Labs (all labs ordered are listed, but only abnormal results are displayed) Labs Reviewed - No data to display  EKG   Radiology No results found.  Procedures Procedures (including critical care time)  Medications Ordered in UC Medications - No data to display  Initial Impression / Assessment and Plan / UC Course  I have reviewed the triage vital signs and the nursing notes.  Pertinent labs & imaging results that were available during my care of the patient were reviewed by me and considered in my medical decision making (see chart for details).     Given patient's hypotension, dizziness with recent fall/head injury, poor oral intake over the past week, suspect likely dehydration, possible correlation to recent restart of blood pressure medicine, but sending patient to emergency room via EMS for further evaluation and work-up to rule out possible stroke as well as blood work and further fluid replacement.   Final Clinical Impressions(s) / UC Diagnoses   Final diagnoses:  Dizziness  Non-intractable vomiting with nausea, unspecified vomiting type  Hypotension, unspecified hypotension type     Discharge Instructions     To ED via EMS    ED Prescriptions    None  PDMP not reviewed this encounter.   Abed Schar, [place] C, PA-C 02/05/20 1020

## 2020-02-05 NOTE — ED Notes (Signed)
IV team at bedside 

## 2020-02-05 NOTE — ED Provider Notes (Signed)
[facility] EMERGENCY DEPARTMENT Provider Note   CSN: [id number] Arrival date & time: 02/05/20  1041     History Chief Complaint  Patient presents with  . Dizziness    [name] is a 40 y.o. female with a past medical history of type 2 diabetes, recurrent episodes of nausea and vomiting, hypertension, anxiety, asthma, regular marijuana use presents to the emergency department for dizziness and lightheadedness.  She was seen on 01/31/2020 with the complaint of nausea and vomiting.  She had a work-up, was treated and discharged.  She was startedon amlodipine for her hypertension.  She states that since that time she is continue to have daily nausea and vomiting and unable to hold anything down.  She has also been feeling extremely lightheaded when she stands up.  She states that she fell to the ground yesterday and a near syncopal episode but did not completely lose consciousness.  She is feeling extremely weak.  She denies any chest pain, shortness of breath or abdominal pain.  HPI     Past Medical History:  Diagnosis Date  . Anxiety   . Asthma   . Depression   . Diabetes mellitus   . Hyperemesis arising during pregnancy   . Hypertension   . Mental disorder     Patient Active Problem List   Diagnosis Date Noted  . Abnormal CT of the abdomen   . Acute esophagitis   . Nausea & vomiting 08/04/2019  . Intractable nausea and vomiting 08/03/2019  . ARF (acute renal failure) ([place]) 08/24/2018  . Uncontrolled type 2 diabetes mellitus with hyperglycemia ([place]) 08/24/2018  . DKA (diabetic ketoacidoses) 08/24/2018  . Non-intractable vomiting   . Sepsis ([place]) 02/15/2016  . Generalized abdominal pain 02/15/2016  . CAP (community acquired pneumonia) 02/14/2016  . 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 Back pain 07/28/2011  . H. pylori infection 11/24/2010  . Depression 11/20/2010  . Asthma 11/20/2010  . Chronic hypertension in pregnancy 11/08/2010  . Type 2  diabetes mellitus ([place]) 11/07/2010    Past Surgical History:  Procedure Laterality Date  . ESOPHAGOGASTRODUODENOSCOPY (EGD) WITH PROPOFOL N/A 08/07/2019   Procedure: ESOPHAGOGASTRODUODENOSCOPY (EGD) WITH PROPOFOL;  Surgeon: Irene Shipper, MD;  Location: [facility] ENDOSCOPY;  Service: Endoscopy;  Laterality: N/A;  . NO PAST SURGERIES    . TUBAL LIGATION  06/20/2011   Procedure: POST PARTUM TUBAL LIGATION;  Surgeon: Mora Bellman, MD;  Location: [facility];  Service: Gynecology;  Laterality: Bilateral;  Bilateral post partum tubal ligation     OB History    Gravida  4   Para  3   Term  2   Preterm  1   AB  1   Living  3     SAB  0   IAB  0   Ectopic  1   Multiple  0   Live Births  3           Family History  Problem Relation Age of Onset  . Heart disease Mother        CHF  . Diabetes Sister   . Anesthesia problems Neg Hx     Social History   Tobacco Use  . Smoking status: Never Smoker  . Smokeless tobacco: Never Used  Vaping Use  . Vaping Use: Never used  Substance Use Topics  . Alcohol use: No    Comment: occassionally  . Drug use: No    Types: Cocaine, Marijuana    Home Medications Prior  to Admission medications   Medication Sig Start Date End Date Taking? Authorizing Provider  amLODipine (NORVASC) 10 MG tablet Take 1 tablet (10 mg total) by mouth daily. 01/31/20   Domenic Moras, PA-C  ferrous sulfate 325 (65 FE) MG tablet Take 1 tablet (325 mg total) by mouth 3 (three) times daily with meals. 02/18/16   Velvet Bathe, MD  hydrALAZINE (APRESOLINE) 50 MG tablet Take 1 tablet (50 mg total) by mouth 3 (three) times daily. 01/31/20 03/01/20  Domenic Moras, PA-C  insulin aspart (NOVOLOG) 100 UNIT/ML injection Before each meal 3 times a day , 140-199 - 2 units, 200-250 - 4 units, 251-299 - 6 units,  300-349 - 8 units,  350 or above 10 units. Insulin PEN if approved, provide syringes and needles if needed. Can switch to any other cheaper alternative. Patient taking  differently: Inject 2-10 Units into the skin See admin instructions. Before each meal 3 times a day , 140-199 - 2 units, 200-250 - 4 units, 251-299 - 6 units,  300-349 - 8 units,  350 or above 10 units. 08/25/18   Thurnell Lose, MD  insulin glargine (LANTUS) 100 UNIT/ML injection Inject 0.1 mLs (10 Units total) into the skin at bedtime. 08/08/19   Dahal, Marlowe Aschoff, MD  Insulin Syringe-Needle U-100 25G X 1" 1 ML MISC For 4 times a day insulin SQ, 1 month supply. Diagnosis E11.65 08/25/18   Thurnell Lose, MD  ondansetron (ZOFRAN ODT) 4 MG disintegrating tablet Take 1 tablet (4 mg total) by mouth every 8 (eight) hours as needed for nausea or vomiting. 01/31/20   Domenic Moras, PA-C  pantoprazole (PROTONIX) 40 MG tablet Take 1 tablet (40 mg total) by mouth daily. 01/31/20 03/01/20  Domenic Moras, PA-C    Allergies    Shellfish allergy and Morphine and related  Review of Systems   Review of Systems  Physical Exam Updated Vital Signs BP (!) 141/97   Pulse (!) 113   Temp 98.3 F (36.8 C) (Oral)   Resp (!) 24   Ht 5\' 7"  (1.702 m)   Wt 90.1 kg   LMP 02/05/2020   SpO2 100%   BMI 31.11 kg/m   Physical Exam Vitals and nursing note reviewed.  Constitutional:      General: She is not in acute distress.    Appearance: She is well-developed and well-nourished. She is not diaphoretic.  HENT:     Head: Normocephalic and atraumatic.  Eyes:     General: No scleral icterus.    Conjunctiva/sclera: Conjunctivae normal.  Cardiovascular:     Rate and Rhythm: Regular rhythm. Tachycardia present.     Heart sounds: Normal heart sounds. No murmur heard. No friction rub. No gallop.   Pulmonary:     Effort: Pulmonary effort is normal. No respiratory distress.     Breath sounds: Normal breath sounds.  Abdominal:     General: Bowel sounds are normal. There is no distension.     Palpations: Abdomen is soft. There is no mass.     Tenderness: There is no abdominal tenderness. There is no guarding.   Musculoskeletal:     Cervical back: Normal range of motion.  Skin:    General: Skin is warm and dry.     Comments: Poor turgor  Neurological:     Mental Status: She is alert and oriented to person, place, and time.  Psychiatric:        Behavior: Behavior normal.     ED Results / Procedures /  Treatments   Labs (all labs ordered are listed, but only abnormal results are displayed) Labs Reviewed  CBG MONITORING, ED - Abnormal; Notable for the following components:      Result Value   Glucose-Capillary 240 (*)    All other components within normal limits  CBC WITH DIFFERENTIAL/PLATELET  COMPREHENSIVE METABOLIC PANEL  LIPASE, BLOOD  URINALYSIS, ROUTINE W REFLEX MICROSCOPIC    EKG EKG Interpretation  Date/Time:  Tuesday February 05 2020 10:51:26 EST Ventricular Rate:  111 PR Interval:    QRS Duration: 73 QT Interval:  332 QTC Calculation: 452 R Axis:   65 Text Interpretation: Sinus tachycardia No STEMI Confirmed by Octaviano Glow [phone]) on 02/05/2020 11:09:43 AM   Radiology No results found.  Procedures Procedures (including critical care time)  Medications Ordered in ED Medications - No data to display  ED Course  I have reviewed the triage vital signs and the nursing notes.  Pertinent labs & imaging results that were available during my care of the patient were reviewed by me and considered in my medical decision making (see chart for details).    MDM Rules/Calculators/A&P                          [id number]  VS: BP 130/86   Pulse (!) 124   Temp 98.3 F (36.8 C) (Oral)   Resp 15   Ht 5\' 7"  (1.702 m)   Wt 90.1 kg   LMP 02/05/2020   SpO2 100%   BMI 31.11 kg/m  [id number] is gathered by patient and review of medical records. Previous records obtained and reviewed. DDX:The patient's complaint of weakness involves an extensive number of diagnostic and treatment options, and is a complaint that carries with it a high risk of complications, morbidity,  and potential mortality. Given the large differential diagnosis, medical decision making is of high complexity. The differential diagnosis of weakness includes but is not limited to neurologic causes (GBS, myasthenia gravis, CVA, MS, ALS, transverse myelitis, spinal cord injury, CVA, botulism, ) and other causes: ACS, Arrhythmia, syncope, orthostatic hypotension, sepsis, hypoglycemia, electrolyte disturbance, hypothyroidism, respiratory failure, symptomatic anemia, dehydration, heat injury, polypharmacy, malignancy.  Labs: I ordered reviewed and interpreted labs which include CBC which shows baseline hemoglobin of 9.9 unchanged, lipase without abnormality CBG elevated to 40. CMP shows acute kidney injury now doubled from baseline.  She has an anion gap acidosis likely secondary to massive dehydration. Imaging:  EKG: Sinus tachycardia at a rate of 111 Consults: Dr. Roosevelt Locks MDM: Patient here with complaint of weakness.  She has significantly positive orthostatic vital signs and is extremely symptomatic.  Her lying blood pressure was 163/103 and standing pressure 89/63.  Patient is not having any active vomiting but states that she feels terrible.  Her orthostatic vital signs were taken after a liter of fluid.  She is continuing to get fluid. Patient disposition:The patient appears reasonably stabilized for admission considering the current resources, flow, and capabilities available in the ED at this time, and I doubt any other [facility] requiring further screening and/or treatment in the ED prior to admission.        Final Clinical Impression(s) / ED Diagnoses Final diagnoses:  AKI (acute kidney injury) ([place])  Dehydration  Orthostatic hypotension    Rx / DC Orders ED Discharge Orders    None       Margarita Mail, PA-C 02/05/20 1457    Wyvonnia Dusky, MD 02/05/20 [phone]

## 2020-02-06 ENCOUNTER — Inpatient Hospital Stay (HOSPITAL_COMMUNITY): Payer: Medicaid Other

## 2020-02-06 DIAGNOSIS — N179 Acute kidney failure, unspecified: Secondary | ICD-10-CM | POA: Diagnosis not present

## 2020-02-06 DIAGNOSIS — E860 Dehydration: Secondary | ICD-10-CM | POA: Diagnosis not present

## 2020-02-06 DIAGNOSIS — I951 Orthostatic hypotension: Secondary | ICD-10-CM | POA: Diagnosis not present

## 2020-02-06 LAB — CBC
HCT: 24.8 % — ABNORMAL LOW (ref 36.0–46.0)
Hemoglobin: 8 g/dL — ABNORMAL LOW (ref 12.0–15.0)
MCH: 23.2 pg — ABNORMAL LOW (ref 26.0–34.0)
MCHC: 32.3 g/dL (ref 30.0–36.0)
MCV: 71.9 fL — ABNORMAL LOW (ref 80.0–100.0)
Platelets: 278 10*3/uL (ref 150–400)
RBC: 3.45 MIL/uL — ABNORMAL LOW (ref 3.87–5.11)
RDW: 16 % — ABNORMAL HIGH (ref 11.5–15.5)
WBC: 4.7 10*3/uL (ref 4.0–10.5)
nRBC: 0 % (ref 0.0–0.2)

## 2020-02-06 LAB — BASIC METABOLIC PANEL
Anion gap: 15 (ref 5–15)
BUN: 63 mg/dL — ABNORMAL HIGH (ref 6–20)
CO2: 20 mmol/L — ABNORMAL LOW (ref 22–32)
Calcium: 7.7 mg/dL — ABNORMAL LOW (ref 8.9–10.3)
Chloride: 98 mmol/L (ref 98–111)
Creatinine, Ser: 4.54 mg/dL — ABNORMAL HIGH (ref 0.44–1.00)
GFR, Estimated: 12 mL/min — ABNORMAL LOW (ref >60)
Glucose, Bld: 162 mg/dL — ABNORMAL HIGH (ref 70–99)
Potassium: 3.4 mmol/L — ABNORMAL LOW (ref 3.5–5.1)
Sodium: 133 mmol/L — ABNORMAL LOW (ref 135–145)

## 2020-02-06 LAB — GLUCOSE, CAPILLARY
Glucose-Capillary: 140 mg/dL — ABNORMAL HIGH (ref 70–99)
Glucose-Capillary: 105 mg/dL — ABNORMAL HIGH (ref 70–99)
Glucose-Capillary: 206 mg/dL — ABNORMAL HIGH (ref 70–99)
Glucose-Capillary: 124 mg/dL — ABNORMAL HIGH (ref 70–99)

## 2020-02-06 LAB — LACTIC ACID, PLASMA: Lactic Acid, Venous: 0.7 mmol/L (ref 0.5–1.9)

## 2020-02-06 MED ORDER — METOCLOPRAMIDE HCL 5 MG/ML IJ SOLN
5.0000 mg | Freq: Three times a day (TID) | INTRAMUSCULAR | Status: DC
  Administered 2020-02-06 – 2020-02-07 (×5): 5 mg via INTRAVENOUS
  Filled 2020-02-06 (×5): qty 2

## 2020-02-06 MED ORDER — ALUM & MAG HYDROXIDE-SIMETH 200-200-20 MG/5ML PO SUSP
15.0000 mL | Freq: Four times a day (QID) | ORAL | Status: AC | PRN
  Administered 2020-02-06 – 2020-02-07 (×3): 15 mL via ORAL
  Filled 2020-02-06 (×3): qty 30

## 2020-02-06 MED ORDER — STERILE WATER FOR INJECTION IV SOLN
INTRAVENOUS | Status: AC
  Filled 2020-02-06 (×2): qty 9.62

## 2020-02-06 NOTE — Progress Notes (Signed)
PROGRESS NOTE    [name]  LYY:[id number] DOB: 1980/12/09 DOA: 02/05/2020 PCP: Patient, No Pcp Per  Brief Narrative: 40 year old female history of insulin-dependent diabetes, hypertension, stage IV chronic kidney disease with baseline creatinine around 2.5-3, and history of frequent episodes of nausea and vomiting presented to the ED 1/18 with dizziness, nausea vomiting, weakness and falls -Patient reports that she came to the ED 7 days ago due to frequent nausea and vomiting, she was dehydrated at the time as well as noted to be hypertensive, subsequently discharged home with amlodipine and hydralazine -In the ED she was found to be dehydrated, hypotensive with positive orthostatic vitals, labs noted AKI on CKD with creatinine of 5.4, bicarb of 14   Assessment & Plan:   AKI on CKD stage IV Metabolic acidosis -Baseline creatinine around 2.5-3, creatinine on admission was 5.4 yesterday, improving with hydration down to 4.6 this morning -Suspect this is prerenal due to frequent nausea and vomiting compounded by hypotension -Also check renal ultrasound -Has never seen a nephrologist for stage IV kidney disease, will need nephrology referral at discharge  Frequent nausea and vomiting -Etiology is unclear at this time, abdominal exam is benign, diabetic gastroparesis is a possibility, chart review also indicates prior suspicion of cannabinoid hyperemesis syndrome -Appears to be improving, add scheduled low-dose Reglan -Diet as tolerated  Type 2 diabetes mellitus -Better controlled recently likely secondary to CKD 4 -Recent hemoglobin A1c is 7.3, continue sliding scale insulin alone for now  Orthostatic hypotension History of hypertension -Continue IV fluids today, antihypertensives on hold  Chronic normocytic anemia -Likely secondary to CKD, slight downtrend with hemodilution, check anemia panel and monitor   DVT prophylaxis: Heparin subcutaneous Code Status: Full code Family  Communication: No family at bedside Disposition Plan:  Status is: Inpatient  Remains inpatient appropriate because:Inpatient level of care appropriate due to severity of illness   Dispo: The patient is from: Home              Anticipated d/c is to: Home              Anticipated d/c date is: 2 days              Patient currently is not medically stable to d/c.  Consultants:   Procedures:   Antimicrobials:    Subjective: -Feels okay, denies any vomiting today, had some nausea  Objective: Vitals:   02/05/20 2020 02/06/20 0017 02/06/20 0613 02/06/20 1042  BP: (!) 177/97 127/83 (!) 169/98 122/71  Pulse: (!) 106 (!) 104 (!) 102 79  Resp: 20 18 18 18   Temp: 98.8 F (37.1 C) 98.1 F (36.7 C) 99.2 F (37.3 C) 98.4 F (36.9 C)  TempSrc: Oral Oral Oral Oral  SpO2: 99% 98% 100%   Weight:      Height:        Intake/Output Summary (Last 24 hours) at 02/06/2020 1220 Last data filed at 02/05/2020 2200 Gross per 24 hour  Intake 1800.66 ml  Output -  Net 1800.66 ml   Filed Weights   02/05/20 1048  Weight: 90.1 kg    Examination:  General exam: Pleasant young female sitting up in bed, AAOx3, no distress HEENT: No JVD CVS: S1-S2, regular rate rhythm  Lungs: Clear bilaterally Abdomen: Soft, nontender, bowel sounds present Extremities: Skin: No rashes on exposed skin Psych: Appropriate mood and affect  Data Reviewed:   CBC: Recent Labs  Lab 01/31/20 0841 01/31/20 0846 02/05/20 1241 02/06/20 0029  WBC  --  9.1  8.6 4.7  NEUTROABS  --   --  5.2  --   HGB 9.2* 8.3* 9.9* 8.0*  HCT 27.0* 28.3* 32.8* 24.8*  MCV  --  76.7* 75.6* 71.9*  PLT  --  307 395 902   Basic Metabolic Panel: Recent Labs  Lab 01/31/20 0841 01/31/20 0846 02/05/20 1241 02/06/20 0029  NA 138 135 129* 133*  K 4.9 4.9 4.7 3.4*  CL  --  106 95* 98  CO2  --  17* 14* 20*  GLUCOSE  --  260* 219* 162*  BUN  --  27* 72* 63*  CREATININE  --  2.62* 5.46* 4.54*  CALCIUM  --  9.1 8.6* 7.7*    GFR: Estimated Creatinine Clearance: 18.6 mL/min (A) (by C-G formula based on SCr of 4.54 mg/dL (H)). Liver Function Tests: Recent Labs  Lab 01/31/20 0846 02/05/20 1241  AST 13* 24  ALT 10 9  ALKPHOS 75 77  BILITOT 0.3 1.2  PROT 7.5 7.5  ALBUMIN 3.0* 3.2*   Recent Labs  Lab 01/31/20 0846 02/05/20 1241  LIPASE 43 35   No results for input(s): AMMONIA in the last 168 hours. Coagulation Profile: No results for input(s): INR, PROTIME in the last 168 hours. Cardiac Enzymes: No results for input(s): CKTOTAL, CKMB, CKMBINDEX, TROPONINI in the last 168 hours. BNP (last 3 results) No results for input(s): PROBNP in the last 8760 hours. HbA1C: Recent Labs    02/05/20 1241  HGBA1C 7.3*   CBG: Recent Labs  Lab 02/05/20 1105 02/05/20 1707 02/05/20 2051 02/06/20 0637 02/06/20 1148  GLUCAP 240* 194* 137* 140* 105*   Lipid Profile: No results for input(s): CHOL, HDL, LDLCALC, TRIG, CHOLHDL, LDLDIRECT in the last 72 hours. Thyroid Function Tests: No results for input(s): TSH, T4TOTAL, FREET4, T3FREE, THYROIDAB in the last 72 hours. Anemia Panel: No results for input(s): VITAMINB12, FOLATE, FERRITIN, TIBC, IRON, RETICCTPCT in the last 72 hours. Urine analysis:    Component Value Date/Time   COLORURINE STRAW (A) 01/31/2020 0923   APPEARANCEUR CLEAR 01/31/2020 0923   LABSPEC 1.008 01/31/2020 0923   PHURINE 7.0 01/31/2020 0923   GLUCOSEU >=500 (A) 01/31/2020 0923   HGBUR NEGATIVE 01/31/2020 0923   BILIRUBINUR NEGATIVE 01/31/2020 0923   KETONESUR NEGATIVE 01/31/2020 0923   PROTEINUR >=300 (A) 01/31/2020 0923   UROBILINOGEN 1.0 08/11/2013 1128   NITRITE NEGATIVE 01/31/2020 0923   LEUKOCYTESUR NEGATIVE 01/31/2020 0923   Sepsis Labs: @LABRCNTIP (procalcitonin:4,lacticidven:4)  ) Recent Results (from the past 240 hour(s))  Resp Panel by RT-PCR (Flu A&B, Covid) Nasopharyngeal Swab     Status: None   Collection Time: 02/05/20  2:24 PM   Specimen: Nasopharyngeal Swab;  Nasopharyngeal(NP) swabs in vial transport medium  Result Value Ref Range Status   SARS Coronavirus 2 by RT PCR NEGATIVE NEGATIVE Final    Comment: (NOTE) SARS-CoV-2 target nucleic acids are NOT DETECTED.  The SARS-CoV-2 RNA is generally detectable in upper respiratory specimens during the acute phase of infection. The lowest concentration of SARS-CoV-2 viral copies this assay can detect is 138 copies/mL. A negative result does not preclude SARS-Cov-2 infection and should not be used as the sole basis for treatment or other patient management decisions. A negative result may occur with  improper specimen collection/handling, submission of specimen other than nasopharyngeal swab, presence of viral mutation(s) within the areas targeted by this assay, and inadequate number of viral copies(<138 copies/mL). A negative result must be combined with clinical observations, patient history, and epidemiological information. The expected result is  Negative.  Fact Sheet for Patients:  EntrepreneurPulse.com.au  Fact Sheet for Healthcare Providers:  IncredibleEmployment.be  This test is no t yet approved or cleared by the [place] FDA and  has been authorized for detection and/or diagnosis of SARS-CoV-2 by FDA under an Emergency Use Authorization (EUA). This EUA will remain  in effect (meaning this test can be used) for the duration of the COVID-19 declaration under Section 564(b)(1) of the Act, 21 U.S.C.section 360bbb-3(b)(1), unless the authorization is terminated  or revoked sooner.       Influenza A by PCR NEGATIVE NEGATIVE Final   Influenza B by PCR NEGATIVE NEGATIVE Final    Comment: (NOTE) The Xpert Xpress SARS-CoV-2/FLU/RSV plus assay is intended as an aid in the diagnosis of influenza from Nasopharyngeal swab specimens and should not be used as a sole basis for treatment. Nasal washings and aspirates are unacceptable for Xpert Xpress  SARS-CoV-2/FLU/RSV testing.  Fact Sheet for Patients: EntrepreneurPulse.com.au  Fact Sheet for Healthcare Providers: IncredibleEmployment.be  This test is not yet approved or cleared by the [place] FDA and has been authorized for detection and/or diagnosis of SARS-CoV-2 by FDA under an Emergency Use Authorization (EUA). This EUA will remain in effect (meaning this test can be used) for the duration of the COVID-19 declaration under Section 564(b)(1) of the Act, 21 U.S.C. section 360bbb-3(b)(1), unless the authorization is terminated or revoked.  Performed at [facility], [place] [address] [place], [place] [address]     Radiology Studies: US RENAL  Result Date: 02/06/2020 CLINICAL DATA:  Acute kidney injury. EXAM: RENAL / URINARY TRACT ULTRASOUND COMPLETE COMPARISON:  CT abdomen pelvis 08/03/2019 FINDINGS: Right Kidney: Renal measurements: 10.1 x 3.9 x 4.6 cm = volume: 95 mL. Echogenicity within normal limits. No mass or hydronephrosis visualized. Left Kidney: Renal measurements: 9.8 x 4.3 x 4.8 cm = volume: 1 L5 mL. Echogenicity within normal limits. No mass or hydronephrosis visualized. Bladder: Appears normal for degree of bladder distention. Right jet only visualized in the bladder. Other: None. IMPRESSION: Negative renal ultrasound. Electronically Signed   By: Franchot Gallo M.D.   On: 02/06/2020 11:00    Scheduled Meds: . heparin  5,000 Units Subcutaneous Q12H  . insulin aspart  0-5 Units Subcutaneous QHS  . insulin aspart  0-9 Units Subcutaneous TID WC  . metoCLOPramide (REGLAN) injection  5 mg Intravenous TID AC  . pantoprazole  40 mg Oral Daily   Continuous Infusions: . sodium bicarbonate in 1/4 NS 1000 mL infusion       LOS: 1 day    Time spent: 58min  Domenic Polite, MD [facility]  02/06/2020, 12:20 PM

## 2020-02-06 NOTE — TOC Initial Note (Signed)
Transition of Care [facility]) - Initial/Assessment Note    Patient Details  Name: [name] MRN: [id number] Date of Birth: [birth year]  Transition of [facility]) CM/SW Contact:    Carles Collet, RN Phone Number: 02/06/2020, 2:19 PM  Clinical Narrative:       Spoke w patient and significant other at bedside. She states that her plan is to return home w SO.  Discussed her barriers to compliance. She cites that she did not have insurance coverage or a PCP. She now has coverage w Medicaid for her prescriptions and her PCP has been assigned as [facility], added to AVS. Patient informed of PCP and will call to schedule follow up. She denies any weakness or difficulty w ambulation, states "I walk fine, I don't need no walker." No other CM needs identified at this time.              Expected Discharge Plan: Home/Self Care Barriers to Discharge: Continued Medical Work up   Patient Goals and CMS Choice Patient states their goals for this hospitalization and ongoing recovery are:: to return home      Expected Discharge Plan and Services Expected Discharge Plan: Home/Self Care   Discharge Planning Services: CM Consult   Living arrangements for the past 2 months: [place]                                      Prior Living Arrangements/Services Living arrangements for the past 2 months: [place] Lives with:: Significant Other                   Activities of Daily Living Home Assistive Devices/Equipment: Blood pressure cuff,CBG Meter,Scales ADL Screening (condition at time of admission) Patient's cognitive ability adequate to safely complete daily activities?: Yes Is the patient deaf or have difficulty hearing?: No Does the patient have difficulty seeing, even when wearing glasses/contacts?: No Does the patient have difficulty concentrating, remembering, or making decisions?: No Patient able to express need for assistance with ADLs?: Yes Does the  patient have difficulty dressing or bathing?: No Independently performs ADLs?: Yes (appropriate for developmental age) Does the patient have difficulty walking or climbing stairs?: Yes (pt stated new Meds doctor gave her,BP Meds) Weakness of Legs: None Weakness of Arms/Hands: None  Permission Sought/Granted                  Emotional Assessment              Admission diagnosis:  Orthostatic hypotension [I95.1] Dehydration [E86.0] AKI (acute kidney injury) ([place]) [N17.9] Patient Active Problem List   Diagnosis Date Noted  . AKI (acute kidney injury) ([place]) 02/05/2020  . Dehydration   . Orthostatic hypotension   . Abnormal CT of the abdomen   . Acute esophagitis   . Nausea & vomiting 08/04/2019  . Intractable nausea and vomiting 08/03/2019  . ARF (acute renal failure) ([place]) 08/24/2018  . Uncontrolled type 2 diabetes mellitus with hyperglycemia ([place]) 08/24/2018  . DKA (diabetic ketoacidoses) 08/24/2018  . Non-intractable vomiting   . Sepsis ([place]) 02/15/2016  . Generalized abdominal pain 02/15/2016  . CAP (community acquired pneumonia) 02/14/2016  . 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 Back pain 07/28/2011  . H. pylori infection 11/24/2010  . Depression 11/20/2010  . Asthma 11/20/2010  . Chronic hypertension in pregnancy 11/08/2010  . Type 2 diabetes mellitus ([place]) 11/07/2010   PCP:  Patient, No Pcp Per Pharmacy:   [id number] [address], [address] [place] [address] Phone: [phone] Fax: [phone]     Social Determinants of Health (SDOH) Interventions    Readmission Risk Interventions No flowsheet data found.

## 2020-02-06 NOTE — Care Management (Addendum)
PCP as assigned by Medicaid is [facility]. Added to AVS for follow up.

## 2020-02-06 NOTE — Plan of Care (Signed)
  Problem: Clinical Measurements: Goal: Respiratory complications will improve Outcome: Progressing   Problem: Coping: Goal: Level of anxiety will decrease Outcome: Progressing   

## 2020-02-06 NOTE — Progress Notes (Incomplete)
Patient c/o indigestion and requests for med

## 2020-02-07 DIAGNOSIS — I951 Orthostatic hypotension: Secondary | ICD-10-CM | POA: Diagnosis not present

## 2020-02-07 DIAGNOSIS — N179 Acute kidney failure, unspecified: Secondary | ICD-10-CM | POA: Diagnosis not present

## 2020-02-07 DIAGNOSIS — E860 Dehydration: Secondary | ICD-10-CM | POA: Diagnosis not present

## 2020-02-07 LAB — CBC
HCT: 22.6 % — ABNORMAL LOW (ref 36.0–46.0)
Hemoglobin: 7.3 g/dL — ABNORMAL LOW (ref 12.0–15.0)
MCH: 23.5 pg — ABNORMAL LOW (ref 26.0–34.0)
MCHC: 32.3 g/dL (ref 30.0–36.0)
MCV: 72.9 fL — ABNORMAL LOW (ref 80.0–100.0)
Platelets: 321 10*3/uL (ref 150–400)
RBC: 3.1 MIL/uL — ABNORMAL LOW (ref 3.87–5.11)
RDW: 15.9 % — ABNORMAL HIGH (ref 11.5–15.5)
WBC: 5.5 10*3/uL (ref 4.0–10.5)
nRBC: 0 % (ref 0.0–0.2)

## 2020-02-07 LAB — FOLATE: Folate: 6.3 ng/mL (ref >5.9)

## 2020-02-07 LAB — BASIC METABOLIC PANEL
Anion gap: 13 (ref 5–15)
BUN: 41 mg/dL — ABNORMAL HIGH (ref 6–20)
CO2: 27 mmol/L (ref 22–32)
Calcium: 8 mg/dL — ABNORMAL LOW (ref 8.9–10.3)
Chloride: 94 mmol/L — ABNORMAL LOW (ref 98–111)
Creatinine, Ser: 3.44 mg/dL — ABNORMAL HIGH (ref 0.44–1.00)
GFR, Estimated: 16 mL/min — ABNORMAL LOW (ref >60)
Glucose, Bld: 182 mg/dL — ABNORMAL HIGH (ref 70–99)
Potassium: 3.3 mmol/L — ABNORMAL LOW (ref 3.5–5.1)
Sodium: 134 mmol/L — ABNORMAL LOW (ref 135–145)

## 2020-02-07 LAB — RETICULOCYTES
Immature Retic Fract: 8.8 % (ref 2.3–15.9)
RBC.: 3.15 MIL/uL — ABNORMAL LOW (ref 3.87–5.11)
Retic Count, Absolute: 25.5 10*3/uL (ref 19.0–186.0)
Retic Ct Pct: 0.8 % (ref 0.4–3.1)

## 2020-02-07 LAB — IRON AND TIBC
Iron: 23 ug/dL — ABNORMAL LOW (ref 28–170)
Saturation Ratios: 7 % — ABNORMAL LOW (ref 10.4–31.8)
TIBC: 315 ug/dL (ref 250–450)
UIBC: 292 ug/dL

## 2020-02-07 LAB — GLUCOSE, CAPILLARY
Glucose-Capillary: 146 mg/dL — ABNORMAL HIGH (ref 70–99)
Glucose-Capillary: 161 mg/dL — ABNORMAL HIGH (ref 70–99)
Glucose-Capillary: 174 mg/dL — ABNORMAL HIGH (ref 70–99)
Glucose-Capillary: 138 mg/dL — ABNORMAL HIGH (ref 70–99)

## 2020-02-07 LAB — VITAMIN B12: Vitamin B-12: 565 pg/mL (ref 180–914)

## 2020-02-07 LAB — FERRITIN: Ferritin: 25 ng/mL (ref 11–307)

## 2020-02-07 MED ORDER — SODIUM CHLORIDE 0.9 % IV SOLN: INTRAVENOUS | Status: DC

## 2020-02-07 MED ORDER — POTASSIUM CHLORIDE CRYS ER 20 MEQ PO TBCR
40.0000 meq | EXTENDED_RELEASE_TABLET | Freq: Once | ORAL | Status: AC
  Administered 2020-02-07: 40 meq via ORAL
  Filled 2020-02-07: qty 2

## 2020-02-07 MED ORDER — STERILE WATER FOR INJECTION IV SOLN
INTRAVENOUS | Status: DC
  Filled 2020-02-07: qty 9.62

## 2020-02-07 MED ORDER — SODIUM CHLORIDE 0.9 % IV SOLN
510.0000 mg | INTRAVENOUS | Status: DC
  Administered 2020-02-07: 510 mg via INTRAVENOUS
  Filled 2020-02-07: qty 17

## 2020-02-07 NOTE — Consult Note (Signed)
   [facility] CM Inpatient Consult   02/07/2020  [name] 11-09-1980 [id number]  Currently not on insurance plan: [place]  Patient on referral list for [facility] however, patient's insurance is no longer with [facility] and is currently not eligible for [place] risk contracted plans.     Membership roster was used to verify patient status.  Plan: Will sign off. For additional questions or referrals please contact:    Natividad Brood, RN BSN [facility] Liaison  [phone] business mobile phone Toll free office [phone]  Fax number: [phone] [email or web address]

## 2020-02-07 NOTE — Progress Notes (Addendum)
PROGRESS NOTE    [name]  FXT:[id number] DOB: 1980/04/10 DOA: 02/05/2020 PCP: Patient, No Pcp Per  Brief Narrative: 40 year old female history of insulin-dependent diabetes, hypertension, stage IV chronic kidney disease with baseline creatinine around 2.5-3, and history of frequent episodes of nausea and vomiting presented to the ED 1/18 with dizziness, nausea vomiting, weakness and falls -Patient reports that she came to the ED 7 days ago due to frequent nausea and vomiting, she was dehydrated at the time as well as noted to be hypertensive, subsequently discharged home with amlodipine and hydralazine -In the ED she was found to be dehydrated, hypotensive with positive orthostatic vitals, labs noted AKI on CKD with creatinine of 5.4, bicarb of 14   Assessment & Plan:   AKI on CKD stage IV Metabolic acidosis -Baseline creatinine around 2.5-3, creatinine on admission was 5.4 on admission, improving with hydration -Repeat labs pending this morning -Suspect this is prerenal due to frequent nausea and vomiting compounded by hypotension -Renal ultrasound unremarkable, urinalysis with bland sediment, 300+ proteinuria -Does not see a nephrologist, will request renal FU -Labs in a.m. and monitor urine output closely  Frequent nausea and vomiting -Improving, abdominal exam is benign, diabetic gastroparesis is a possibility, chart review also indicates prior suspicion of cannabinoid hyperemesis syndrome -Appears to be improving, add scheduled low-dose Reglan -Diet as tolerated -Add PPI  Type 2 diabetes mellitus -Better controlled recently likely secondary to CKD 4 -Recent hemoglobin A1c is 7.3, continue sliding scale insulin alone for now  Orthostatic hypotension History of hypertension -Continue IV fluids today, continue labetalol  Chronic normocytic anemia Iron defi anemia -Likely secondary to CKD, slight downtrend with hemodilution -anemia panel with Fe deficiency -give IV Fe  today  DVT prophylaxis: Heparin subcutaneous Code Status: Full code Family Communication: No family at bedside Disposition Plan:  Status is: Inpatient  Remains inpatient appropriate because:Inpatient level of care appropriate due to severity of illness   Dispo: The patient is from: Home              Anticipated d/c is to: Home              Anticipated d/c date is: 1-2 days              Patient currently is not medically stable to d/c.  Consultants:  Procedures:   Antimicrobials:    Subjective: -Feels better, Objective: Vitals:   02/06/20 1042 02/06/20 1800 02/07/20 0450 02/07/20 0919  BP: 122/71 122/74 138/71 (!) 141/81  Pulse: 79 82 71 77  Resp: 18 18 17 18   Temp: 98.4 F (36.9 C) 98.6 F (37 C) 98.5 F (36.9 C) 99.2 F (37.3 C)  TempSrc: Oral Oral Oral Oral  SpO2:   96% 99%  Weight:      Height:        Intake/Output Summary (Last 24 hours) at 02/07/2020 1105 Last data filed at 02/07/2020 0800 Gross per 24 hour  Intake 2018.68 ml  Output 0 ml  Net 2018.68 ml   Filed Weights   02/05/20 1048  Weight: 90.1 kg    Examination:  General exam: Pleasant young female sitting up in bed, AAOx3, no distress HEENT: No JVD CVS: S1-S2, regular rate rhythm Lungs: Clear bilaterally Abdomen: Soft, nontender, bowel sounds present Extremities: No edema Skin: No rashes on exposed skin Psych: Appropriate mood and affect  Data Reviewed:   CBC: Recent Labs  Lab 02/05/20 1241 02/06/20 0029  WBC 8.6 4.7  NEUTROABS 5.2  --   HGB  9.9* 8.0*  HCT 32.8* 24.8*  MCV 75.6* 71.9*  PLT 395 892   Basic Metabolic Panel: Recent Labs  Lab 02/05/20 1241 02/06/20 0029  NA 129* 133*  K 4.7 3.4*  CL 95* 98  CO2 14* 20*  GLUCOSE 219* 162*  BUN 72* 63*  CREATININE 5.46* 4.54*  CALCIUM 8.6* 7.7*   GFR: Estimated Creatinine Clearance: 18.6 mL/min (A) (by C-G formula based on SCr of 4.54 mg/dL (H)). Liver Function Tests: Recent Labs  Lab 02/05/20 1241  AST 24  ALT 9   ALKPHOS 77  BILITOT 1.2  PROT 7.5  ALBUMIN 3.2*   Recent Labs  Lab 02/05/20 1241  LIPASE 35   No results for input(s): AMMONIA in the last 168 hours. Coagulation Profile: No results for input(s): INR, PROTIME in the last 168 hours. Cardiac Enzymes: No results for input(s): CKTOTAL, CKMB, CKMBINDEX, TROPONINI in the last 168 hours. BNP (last 3 results) No results for input(s): PROBNP in the last 8760 hours. HbA1C: Recent Labs    02/05/20 1241  HGBA1C 7.3*   CBG: Recent Labs  Lab 02/06/20 0637 02/06/20 1148 02/06/20 1715 02/06/20 2201 02/07/20 0647  GLUCAP 140* 105* 206* 124* 146*   Lipid Profile: No results for input(s): CHOL, HDL, LDLCALC, TRIG, CHOLHDL, LDLDIRECT in the last 72 hours. Thyroid Function Tests: No results for input(s): TSH, T4TOTAL, FREET4, T3FREE, THYROIDAB in the last 72 hours. Anemia Panel: No results for input(s): VITAMINB12, FOLATE, FERRITIN, TIBC, IRON, RETICCTPCT in the last 72 hours. Urine analysis:    Component Value Date/Time   COLORURINE STRAW (A) 01/31/2020 0923   APPEARANCEUR CLEAR 01/31/2020 0923   LABSPEC 1.008 01/31/2020 0923   PHURINE 7.0 01/31/2020 0923   GLUCOSEU >=500 (A) 01/31/2020 0923   HGBUR NEGATIVE 01/31/2020 0923   BILIRUBINUR NEGATIVE 01/31/2020 0923   KETONESUR NEGATIVE 01/31/2020 0923   PROTEINUR >=300 (A) 01/31/2020 0923   UROBILINOGEN 1.0 08/11/2013 1128   NITRITE NEGATIVE 01/31/2020 0923   LEUKOCYTESUR NEGATIVE 01/31/2020 0923   Sepsis Labs: @LABRCNTIP (procalcitonin:4,lacticidven:4)  ) Recent Results (from the past 240 hour(s))  Resp Panel by RT-PCR (Flu A&B, Covid) Nasopharyngeal Swab     Status: None   Collection Time: 02/05/20  2:24 PM   Specimen: Nasopharyngeal Swab; Nasopharyngeal(NP) swabs in vial transport medium  Result Value Ref Range Status   SARS Coronavirus 2 by RT PCR NEGATIVE NEGATIVE Final    Comment: (NOTE) SARS-CoV-2 target nucleic acids are NOT DETECTED.  The SARS-CoV-2 RNA is  generally detectable in upper respiratory specimens during the acute phase of infection. The lowest concentration of SARS-CoV-2 viral copies this assay can detect is 138 copies/mL. A negative result does not preclude SARS-Cov-2 infection and should not be used as the sole basis for treatment or other patient management decisions. A negative result may occur with  improper specimen collection/handling, submission of specimen other than nasopharyngeal swab, presence of viral mutation(s) within the areas targeted by this assay, and inadequate number of viral copies(<138 copies/mL). A negative result must be combined with clinical observations, patient history, and epidemiological information. The expected result is Negative.  Fact Sheet for Patients:  EntrepreneurPulse.com.au  Fact Sheet for Healthcare Providers:  IncredibleEmployment.be  This test is no t yet approved or cleared by the [place] FDA and  has been authorized for detection and/or diagnosis of SARS-CoV-2 by FDA under an Emergency Use Authorization (EUA). This EUA will remain  in effect (meaning this test can be used) for the duration of the COVID-19 declaration under  Section 564(b)(1) of the Act, 21 U.S.C.section 360bbb-3(b)(1), unless the authorization is terminated  or revoked sooner.       Influenza A by PCR NEGATIVE NEGATIVE Final   Influenza B by PCR NEGATIVE NEGATIVE Final    Comment: (NOTE) The Xpert Xpress SARS-CoV-2/FLU/RSV plus assay is intended as an aid in the diagnosis of influenza from Nasopharyngeal swab specimens and should not be used as a sole basis for treatment. Nasal washings and aspirates are unacceptable for Xpert Xpress SARS-CoV-2/FLU/RSV testing.  Fact Sheet for Patients: EntrepreneurPulse.com.au  Fact Sheet for Healthcare Providers: IncredibleEmployment.be  This test is not yet approved or cleared by the [place] FDA and has been authorized for detection and/or diagnosis of SARS-CoV-2 by FDA under an Emergency Use Authorization (EUA). This EUA will remain in effect (meaning this test can be used) for the duration of the COVID-19 declaration under Section 564(b)(1) of the Act, 21 U.S.C. section 360bbb-3(b)(1), unless the authorization is terminated or revoked.  Performed at [facility], [place] [address], [place], [place] [address]     Radiology Studies: US RENAL  Result Date: 02/06/2020 CLINICAL DATA:  Acute kidney injury. EXAM: RENAL / URINARY TRACT ULTRASOUND COMPLETE COMPARISON:  CT abdomen pelvis 08/03/2019 FINDINGS: Right Kidney: Renal measurements: 10.1 x 3.9 x 4.6 cm = volume: 95 mL. Echogenicity within normal limits. No mass or hydronephrosis visualized. Left Kidney: Renal measurements: 9.8 x 4.3 x 4.8 cm = volume: 1 L5 mL. Echogenicity within normal limits. No mass or hydronephrosis visualized. Bladder: Appears normal for degree of bladder distention. Right jet only visualized in the bladder. Other: None. IMPRESSION: Negative renal ultrasound. Electronically Signed   By: Franchot Gallo M.D.   On: 02/06/2020 11:00    Scheduled Meds: . heparin  5,000 Units Subcutaneous Q12H  . insulin aspart  0-5 Units Subcutaneous QHS  . insulin aspart  0-9 Units Subcutaneous TID WC  . metoCLOPramide (REGLAN) injection  5 mg Intravenous TID AC  . pantoprazole  40 mg Oral Daily   Continuous Infusions:    LOS: 2 days    Time spent: 64min  Domenic Polite, MD [facility]  02/07/2020, 11:05 AM

## 2020-02-07 NOTE — Plan of Care (Signed)
  Problem: Clinical Measurements: Goal: Diagnostic test results will improve Outcome: Progressing   Problem: Coping: Goal: Level of anxiety will decrease Outcome: Progressing   

## 2020-02-08 DIAGNOSIS — E1122 Type 2 diabetes mellitus with diabetic chronic kidney disease: Secondary | ICD-10-CM | POA: Diagnosis not present

## 2020-02-08 DIAGNOSIS — Z794 Long term (current) use of insulin: Secondary | ICD-10-CM | POA: Diagnosis not present

## 2020-02-08 DIAGNOSIS — N184 Chronic kidney disease, stage 4 (severe): Secondary | ICD-10-CM

## 2020-02-08 DIAGNOSIS — N179 Acute kidney failure, unspecified: Secondary | ICD-10-CM | POA: Diagnosis not present

## 2020-02-08 LAB — BASIC METABOLIC PANEL
Anion gap: 9 (ref 5–15)
BUN: 30 mg/dL — ABNORMAL HIGH (ref 6–20)
CO2: 26 mmol/L (ref 22–32)
Calcium: 8.2 mg/dL — ABNORMAL LOW (ref 8.9–10.3)
Chloride: 101 mmol/L (ref 98–111)
Creatinine, Ser: 3.02 mg/dL — ABNORMAL HIGH (ref 0.44–1.00)
GFR, Estimated: 19 mL/min — ABNORMAL LOW (ref >60)
Glucose, Bld: 200 mg/dL — ABNORMAL HIGH (ref 70–99)
Potassium: 3.6 mmol/L (ref 3.5–5.1)
Sodium: 136 mmol/L (ref 135–145)

## 2020-02-08 LAB — CBC
HCT: 22.8 % — ABNORMAL LOW (ref 36.0–46.0)
Hemoglobin: 7.4 g/dL — ABNORMAL LOW (ref 12.0–15.0)
MCH: 23.6 pg — ABNORMAL LOW (ref 26.0–34.0)
MCHC: 32.5 g/dL (ref 30.0–36.0)
MCV: 72.6 fL — ABNORMAL LOW (ref 80.0–100.0)
Platelets: 274 10*3/uL (ref 150–400)
RBC: 3.14 MIL/uL — ABNORMAL LOW (ref 3.87–5.11)
RDW: 15.8 % — ABNORMAL HIGH (ref 11.5–15.5)
WBC: 6.1 10*3/uL (ref 4.0–10.5)
nRBC: 0 % (ref 0.0–0.2)

## 2020-02-08 LAB — GLUCOSE, CAPILLARY
Glucose-Capillary: 172 mg/dL — ABNORMAL HIGH (ref 70–99)
Glucose-Capillary: 102 mg/dL — ABNORMAL HIGH (ref 70–99)

## 2020-02-08 MED ORDER — GLIPIZIDE 5 MG PO TABS: 5.0000 mg | ORAL_TABLET | Freq: Every day | ORAL | 0 refills | Status: DC

## 2020-02-08 MED ORDER — FERROUS SULFATE 325 (65 FE) MG PO TABS: 325.0000 mg | ORAL_TABLET | Freq: Two times a day (BID) | ORAL | 0 refills | Status: DC

## 2020-02-08 MED ORDER — HYDRALAZINE HCL 25 MG PO TABS: 25.0000 mg | ORAL_TABLET | Freq: Two times a day (BID) | ORAL | 0 refills | Status: DC

## 2020-02-08 MED ORDER — AMLODIPINE BESYLATE 10 MG PO TABS
10.0000 mg | ORAL_TABLET | Freq: Every day | ORAL | Status: DC
Start: 2020-02-08 — End: 2020-02-08
  Administered 2020-02-08: 10 mg via ORAL
  Filled 2020-02-08: qty 1

## 2020-02-08 NOTE — Plan of Care (Signed)
°  Problem: Coping: °Goal: Level of anxiety will decrease °Outcome: Progressing °  °

## 2020-02-08 NOTE — Progress Notes (Signed)
DISCHARGE NOTE HOME [name] to be discharged Home per MD order. Discussed prescriptions and follow up appointments with the patient. Prescriptions given to patient; medication list explained in detail. Patient verbalized understanding.  Skin clean, dry and intact without evidence of skin break down, no evidence of skin tears noted. IV catheter discontinued intact. Site without signs and symptoms of complications. Dressing and pressure applied. Pt denies pain at the site currently. No complaints noted.  Patient free of lines, drains, and wounds.   An After Visit Summary (AVS) was printed and given to the patient. Patient escorted via wheelchair, and discharged home via private auto.  Orville Govern, RN

## 2020-02-21 NOTE — Discharge Summary (Signed)
Physician Discharge Summary  [name] MCN:[id number] DOB: 1980-07-09 DOA: 02/05/2020  PCP: Patient, No Pcp Per  Admit date: 02/05/2020 Discharge date: 02/08/2020  Time spent: 35 minutes  Recommendations for Outpatient Follow-up:  1. PCP in 1 week, needs CBC and BMP checked at follow-up 2. [facility], office to call patient with follow-up   Discharge Diagnoses:  Active Problems:   Nausea & vomiting   AKI (acute kidney injury) (HCC) Chronic kidney disease stage IV Type 2 diabetes mellitus Intractable nausea and vomiting Anemia of chronic disease Iron deficiency anemia  Discharge Condition: Stable  Diet recommendation: Low-sodium, diabetic  Filed Weights   02/05/20 1048  Weight: 90.1 kg    History of present illness:  40 year old female history of insulin-dependent diabetes, hypertension, stage IV chronic kidney disease with baseline creatinine around 2.5-3, and history of frequent episodes of nausea and vomiting presented to the ED 1/18 with dizziness, nausea vomiting, weakness and falls -Patient reports that she came to the ED 7 days ago due to frequent nausea and vomiting, she was dehydrated at the time as well as noted to be hypertensive, subsequently discharged home with amlodipine and hydralazine -In the ED she was found to be dehydrated, hypotensive with positive orthostatic vitals, labs noted AKI on CKD with creatinine of 5.4, bicarb of 14  Hospital Course:   AKI on CKD stage IV Metabolic acidosis -Baseline creatinine around 2.5-3, creatinine on admission was 5.4 on admission, improving with hydration -This is prerenal due to frequent nausea and vomiting compounded by hypotension -Renal ultrasound unremarkable, urinalysis with bland sediment, 300+ proteinuria -Creatinine improved from 5.4 at the time of admission to 3.0 at discharge which is her baseline -Does not see a nephrologist,  called and discussed with on-call MD, [facility]  they will arrange outpatient follow-up for CKD  Nausea and vomiting -Improving, abdominal exam is benign, diabetic gastroparesis is a possibility, chart review also indicates prior suspicion of cannabinoid hyperemesis syndrome -Improved with supportive care, scheduled Reglan, bowel rest, diet was gradually improved, tolerating diabetic diet at the time of discharge  Type 2 diabetes mellitus -Better controlled recently likely secondary to CKD 4 -Recent hemoglobin A1c is 7.3, continue sliding scale insulin alone for now  Orthostatic hypotension History of hypertension orthostasis resolved with hydration, continue labetalol  Chronic normocytic anemia Iron defi anemia -Likely secondary to CKD, slight downtrend with hemodilution -anemia panel with Fe deficiency -given IV Iron and started Oral Fe at discharge  Discharge Exam: Vitals:   02/08/20 0435 02/08/20 0931  BP: (!) 158/87 (!) 171/91  Pulse: 74 79  Resp: 16 18  Temp: 98.6 F (37 C) 98.4 F (36.9 C)  SpO2: 97% 100%    General: AAOx3 Cardiovascular: S1S2.RRR Respiratory: CTAB  Discharge Instructions   Discharge Instructions    Care order/instruction   Complete by: As directed    DC home after lunch   Diet - low sodium heart healthy   Complete by: As directed    Diet Carb Modified   Complete by: As directed    Increase activity slowly   Complete by: As directed      Allergies as of 02/08/2020      Reactions   Shellfish Allergy Anaphylaxis, Swelling   Morphine And Related Nausea And Vomiting      Medication List    STOP taking these medications   insulin glargine 100 UNIT/ML injection Commonly known as: Lantus   ondansetron 4 MG disintegrating tablet Commonly known as: Zofran ODT  pantoprazole 40 MG tablet Commonly known as: PROTONIX     TAKE these medications   amLODipine 10 MG tablet Commonly known as: NORVASC Take 1 tablet (10 mg total) by mouth daily.   ferrous sulfate 325 (65 FE) MG  tablet Take 1 tablet (325 mg total) by mouth 2 (two) times daily with a meal. What changed: when to take this   glipiZIDE 5 MG tablet Commonly known as: GLUCOTROL Take 1 tablet (5 mg total) by mouth daily before breakfast.   hydrALAZINE 25 MG tablet Commonly known as: APRESOLINE Take 1 tablet (25 mg total) by mouth 2 (two) times daily. What changed:   medication strength  how much to take  when to take this   insulin aspart 100 UNIT/ML injection Commonly known as: NovoLOG Before each meal 3 times a day , 140-199 - 2 units, 200-250 - 4 units, 251-299 - 6 units,  300-349 - 8 units,  350 or above 10 units. Insulin PEN if approved, provide syringes and needles if needed. Can switch to any other cheaper alternative.   Insulin Syringe-Needle U-100 25G X 1" 1 ML Misc For 4 times a day insulin SQ, 1 month supply. Diagnosis E11.65      Allergies  Allergen Reactions  . Shellfish Allergy Anaphylaxis and Swelling  . Morphine And Related Nausea And Vomiting    [place], [facility]. Schedule an appointment as soon as possible for a visit.   Specialty: Pediatrics Why: Your Primary Care Provider as assigned through Medicaid.  Contact information: [address] [place] [address] [phone]        [facility] [place] Follow up in 1 month(s).   Contact information: [place] [address] [phone]                The results of significant diagnostics from this hospitalization (including imaging, microbiology, ancillary and laboratory) are listed below for reference.    Significant Diagnostic Studies: US RENAL  Result Date: 02/06/2020 CLINICAL DATA:  Acute kidney injury. EXAM: RENAL / URINARY TRACT ULTRASOUND COMPLETE COMPARISON:  CT abdomen pelvis 08/03/2019 FINDINGS: Right Kidney: Renal measurements: 10.1 x 3.9 x 4.6 cm = volume: 95 mL. Echogenicity within normal limits. No mass or hydronephrosis visualized.  Left Kidney: Renal measurements: 9.8 x 4.3 x 4.8 cm = volume: 1 L5 mL. Echogenicity within normal limits. No mass or hydronephrosis visualized. Bladder: Appears normal for degree of bladder distention. Right jet only visualized in the bladder. Other: None. IMPRESSION: Negative renal ultrasound. Electronically Signed   By: Franchot Gallo M.D.   On: 02/06/2020 11:00    Microbiology: No results found for this or any previous visit (from the past 240 hour(s)).   Labs: Basic Metabolic Panel: No results for input(s): NA, K, CL, CO2, GLUCOSE, BUN, CREATININE, CALCIUM, MG, PHOS in the last 168 hours. Liver Function Tests: No results for input(s): AST, ALT, ALKPHOS, BILITOT, PROT, ALBUMIN in the last 168 hours. No results for input(s): LIPASE, AMYLASE in the last 168 hours. No results for input(s): AMMONIA in the last 168 hours. CBC: No results for input(s): WBC, NEUTROABS, HGB, HCT, MCV, PLT in the last 168 hours. Cardiac Enzymes: No results for input(s): CKTOTAL, CKMB, CKMBINDEX, TROPONINI in the last 168 hours. BNP: BNP (last 3 results) No results for input(s): BNP in the last 8760 hours.  ProBNP (last 3 results) No results for input(s): PROBNP in the last 8760 hours.  CBG: No results for  input(s): GLUCAP in the last 168 hours.     Signed:  Domenic Polite MD.  [facility] 02/21/2020, 3:01 PM

## 2020-09-22 ENCOUNTER — Other Ambulatory Visit: Payer: Self-pay

## 2020-09-22 ENCOUNTER — Emergency Department (HOSPITAL_COMMUNITY): Payer: Medicaid Other

## 2020-09-22 ENCOUNTER — Emergency Department (HOSPITAL_COMMUNITY)
Admission: EM | Admit: 2020-09-22 | Discharge: 2020-09-22 | Disposition: A | Discharge status: Home / Self Care | Payer: Medicaid Other | Attending: Emergency Medicine | Admitting: Emergency Medicine

## 2020-09-22 ENCOUNTER — Inpatient Hospital Stay (HOSPITAL_COMMUNITY)
Admission: EM | Admit: 2020-09-22 | Discharge: 2020-09-25 | DRG: 392 | Disposition: A | Discharge status: Home / Self Care | Payer: Medicaid Other | Attending: Internal Medicine | Admitting: Internal Medicine

## 2020-09-22 DIAGNOSIS — Z7984 Long term (current) use of oral hypoglycemic drugs: Secondary | ICD-10-CM | POA: Insufficient documentation

## 2020-09-22 DIAGNOSIS — I10 Essential (primary) hypertension: Secondary | ICD-10-CM

## 2020-09-22 DIAGNOSIS — Z794 Long term (current) use of insulin: Secondary | ICD-10-CM | POA: Insufficient documentation

## 2020-09-22 DIAGNOSIS — F1291 Cannabis use, unspecified, in remission: Secondary | ICD-10-CM

## 2020-09-22 DIAGNOSIS — E119 Type 2 diabetes mellitus without complications: Secondary | ICD-10-CM | POA: Insufficient documentation

## 2020-09-22 DIAGNOSIS — F419 Anxiety disorder, unspecified: Secondary | ICD-10-CM | POA: Diagnosis present

## 2020-09-22 DIAGNOSIS — J45909 Unspecified asthma, uncomplicated: Secondary | ICD-10-CM | POA: Insufficient documentation

## 2020-09-22 DIAGNOSIS — M549 Dorsalgia, unspecified: Secondary | ICD-10-CM | POA: Diagnosis present

## 2020-09-22 DIAGNOSIS — Z885 Allergy status to narcotic agent status: Secondary | ICD-10-CM

## 2020-09-22 DIAGNOSIS — R112 Nausea with vomiting, unspecified: Secondary | ICD-10-CM | POA: Diagnosis not present

## 2020-09-22 DIAGNOSIS — Z833 Family history of diabetes mellitus: Secondary | ICD-10-CM

## 2020-09-22 DIAGNOSIS — F1290 Cannabis use, unspecified, uncomplicated: Secondary | ICD-10-CM | POA: Diagnosis present

## 2020-09-22 DIAGNOSIS — R1033 Periumbilical pain: Secondary | ICD-10-CM | POA: Diagnosis present

## 2020-09-22 DIAGNOSIS — Z79899 Other long term (current) drug therapy: Secondary | ICD-10-CM | POA: Diagnosis not present

## 2020-09-22 DIAGNOSIS — R42 Dizziness and giddiness: Secondary | ICD-10-CM | POA: Diagnosis present

## 2020-09-22 DIAGNOSIS — F32A Depression, unspecified: Secondary | ICD-10-CM | POA: Diagnosis present

## 2020-09-22 DIAGNOSIS — N179 Acute kidney failure, unspecified: Secondary | ICD-10-CM | POA: Diagnosis present

## 2020-09-22 DIAGNOSIS — K219 Gastro-esophageal reflux disease without esophagitis: Secondary | ICD-10-CM | POA: Diagnosis present

## 2020-09-22 DIAGNOSIS — I160 Hypertensive urgency: Secondary | ICD-10-CM | POA: Diagnosis present

## 2020-09-22 DIAGNOSIS — I129 Hypertensive chronic kidney disease with stage 1 through stage 4 chronic kidney disease, or unspecified chronic kidney disease: Secondary | ICD-10-CM | POA: Diagnosis present

## 2020-09-22 DIAGNOSIS — Z91013 Allergy to seafood: Secondary | ICD-10-CM

## 2020-09-22 DIAGNOSIS — Z20822 Contact with and (suspected) exposure to covid-19: Secondary | ICD-10-CM | POA: Diagnosis present

## 2020-09-22 DIAGNOSIS — N9489 Other specified conditions associated with female genital organs and menstrual cycle: Secondary | ICD-10-CM | POA: Diagnosis not present

## 2020-09-22 DIAGNOSIS — Z8249 Family history of ischemic heart disease and other diseases of the circulatory system: Secondary | ICD-10-CM

## 2020-09-22 DIAGNOSIS — E1122 Type 2 diabetes mellitus with diabetic chronic kidney disease: Secondary | ICD-10-CM | POA: Diagnosis present

## 2020-09-22 DIAGNOSIS — E872 Acidosis: Secondary | ICD-10-CM | POA: Diagnosis present

## 2020-09-22 DIAGNOSIS — D638 Anemia in other chronic diseases classified elsewhere: Secondary | ICD-10-CM | POA: Diagnosis present

## 2020-09-22 DIAGNOSIS — Z87898 Personal history of other specified conditions: Secondary | ICD-10-CM

## 2020-09-22 DIAGNOSIS — N184 Chronic kidney disease, stage 4 (severe): Secondary | ICD-10-CM | POA: Diagnosis present

## 2020-09-22 DIAGNOSIS — R739 Hyperglycemia, unspecified: Secondary | ICD-10-CM

## 2020-09-22 LAB — URINALYSIS, ROUTINE W REFLEX MICROSCOPIC
Bacteria, UA: NONE SEEN
Bilirubin Urine: NEGATIVE
Glucose, UA: 50 mg/dL — AB
Hgb urine dipstick: NEGATIVE
Ketones, ur: 5 mg/dL — AB
Leukocytes,Ua: NEGATIVE
Nitrite: NEGATIVE
Protein, ur: >=300 mg/dL — AB
Specific Gravity, Urine: 1.015 (ref 1.005–1.030)
pH: 6 (ref 5.0–8.0)

## 2020-09-22 LAB — CBC
HCT: 32.8 % — ABNORMAL LOW (ref 36.0–46.0)
Hemoglobin: 9.8 g/dL — ABNORMAL LOW (ref 12.0–15.0)
MCH: 24.6 pg — ABNORMAL LOW (ref 26.0–34.0)
MCHC: 29.9 g/dL — ABNORMAL LOW (ref 30.0–36.0)
MCV: 82.4 fL (ref 80.0–100.0)
Platelets: 282 10*3/uL (ref 150–400)
RBC: 3.98 MIL/uL (ref 3.87–5.11)
RDW: 14 % (ref 11.5–15.5)
WBC: 6.6 10*3/uL (ref 4.0–10.5)
nRBC: 0 % (ref 0.0–0.2)

## 2020-09-22 LAB — COMPREHENSIVE METABOLIC PANEL
ALT: 9 U/L (ref 0–44)
AST: 15 U/L (ref 15–41)
Albumin: 3.3 g/dL — ABNORMAL LOW (ref 3.5–5.0)
Alkaline Phosphatase: 75 U/L (ref 38–126)
Anion gap: 11 (ref 5–15)
BUN: 37 mg/dL — ABNORMAL HIGH (ref 6–20)
CO2: 15 mmol/L — ABNORMAL LOW (ref 22–32)
Calcium: 9.1 mg/dL (ref 8.9–10.3)
Chloride: 110 mmol/L (ref 98–111)
Creatinine, Ser: 3.49 mg/dL — ABNORMAL HIGH (ref 0.44–1.00)
GFR, Estimated: 16 mL/min — ABNORMAL LOW (ref >60)
Glucose, Bld: 151 mg/dL — ABNORMAL HIGH (ref 70–99)
Potassium: 4.9 mmol/L (ref 3.5–5.1)
Sodium: 136 mmol/L (ref 135–145)
Total Bilirubin: 0.6 mg/dL (ref 0.3–1.2)
Total Protein: 8 g/dL (ref 6.5–8.1)

## 2020-09-22 LAB — CBG MONITORING, ED: Glucose-Capillary: 150 mg/dL — ABNORMAL HIGH (ref 70–99)

## 2020-09-22 LAB — I-STAT BETA HCG BLOOD, ED (MC, WL, AP ONLY): I-stat hCG, quantitative: 7 m[IU]/mL — ABNORMAL HIGH (ref <5)

## 2020-09-22 LAB — LIPASE, BLOOD: Lipase: 56 U/L — ABNORMAL HIGH (ref 11–51)

## 2020-09-22 MED ORDER — LABETALOL HCL 5 MG/ML IV SOLN
10.0000 mg | Freq: Once | INTRAVENOUS | Status: AC
  Administered 2020-09-22: 10 mg via INTRAVENOUS
  Filled 2020-09-22: qty 4

## 2020-09-22 MED ORDER — ONDANSETRON HCL 4 MG/2ML IJ SOLN
4.0000 mg | Freq: Once | INTRAMUSCULAR | Status: AC
  Administered 2020-09-22: 4 mg via INTRAVENOUS
  Filled 2020-09-22: qty 2

## 2020-09-22 MED ORDER — DROPERIDOL 2.5 MG/ML IJ SOLN
1.2500 mg | Freq: Once | INTRAMUSCULAR | Status: AC
  Administered 2020-09-22: 1.25 mg via INTRAVENOUS
  Filled 2020-09-22: qty 2

## 2020-09-22 MED ORDER — METOPROLOL SUCCINATE ER 25 MG PO TB24: 25.0000 mg | ORAL_TABLET | Freq: Every day | ORAL | 0 refills | Status: DC

## 2020-09-22 MED ORDER — SODIUM CHLORIDE 0.9 % IV BOLUS
1000.0000 mL | Freq: Once | INTRAVENOUS | Status: AC
  Administered 2020-09-22: 1000 mL via INTRAVENOUS

## 2020-09-22 MED ORDER — AMLODIPINE BESYLATE 5 MG PO TABS
10.0000 mg | ORAL_TABLET | Freq: Once | ORAL | Status: AC
  Administered 2020-09-22: 10 mg via ORAL
  Filled 2020-09-22: qty 2

## 2020-09-22 MED ORDER — HYDRALAZINE HCL 25 MG PO TABS
25.0000 mg | ORAL_TABLET | Freq: Once | ORAL | Status: AC
  Administered 2020-09-22: 25 mg via ORAL
  Filled 2020-09-22: qty 1

## 2020-09-22 NOTE — ED Triage Notes (Signed)
Pt c/o nausea and vomiting since earlier today. Pt reports blood in the vomit. Pt also reports lower abd pain.

## 2020-09-22 NOTE — ED Provider Notes (Signed)
[facility] EMERGENCY DEPARTMENT Provider Note   CSN: [id number] Arrival date & time: 09/22/20  3762     History Chief Complaint  Patient presents with   Emesis   Dizziness    [name] is a 40 y.o. female.  Patient presents to ER chief complaint of lightheadedness and vomiting.  She states that she has been taking her blood patient patient states that she has been taking her blood pressure medication and statin intermittently.  She states that she just acquired insurance and has been trying to find a primary care doctor.  Otherwise denies any fevers or cough no diarrhea.      Past Medical History:  Diagnosis Date   Anxiety    Asthma    Depression    Diabetes mellitus    Hyperemesis arising during pregnancy    Hypertension    Mental disorder     Patient Active Problem List   Diagnosis Date Noted   AKI (acute kidney injury) ([place]) 02/05/2020   Dehydration    Orthostatic hypotension    Abnormal CT of the abdomen    Acute esophagitis    Nausea & vomiting 08/04/2019   Intractable nausea and vomiting 08/03/2019   ARF (acute renal failure) ([place]) 08/24/2018   Uncontrolled type 2 diabetes mellitus with hyperglycemia ([place]) 08/24/2018   DKA (diabetic ketoacidoses) 08/24/2018   Non-intractable vomiting    Sepsis ([place]) 02/15/2016   Generalized abdominal pain 02/15/2016   CAP (community acquired pneumonia) 02/14/2016   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 pylori infection 11/24/2010   Depression 11/20/2010   Asthma 11/20/2010   Chronic hypertension in pregnancy 11/08/2010   Type 2 diabetes mellitus ([place]) 11/07/2010    Past Surgical History:  Procedure Laterality Date   ESOPHAGOGASTRODUODENOSCOPY (EGD) WITH PROPOFOL N/A 08/07/2019   Procedure: ESOPHAGOGASTRODUODENOSCOPY (EGD) WITH PROPOFOL;  Surgeon: Irene Shipper, MD;  Location: [facility] ENDOSCOPY;  Service: Endoscopy;  Laterality: N/A;   NO PAST SURGERIES     TUBAL LIGATION   06/20/2011   Procedure: POST PARTUM TUBAL LIGATION;  Surgeon: Mora Bellman, MD;  Location: [facility];  Service: Gynecology;  Laterality: Bilateral;  Bilateral post partum tubal ligation     OB History     Gravida  4   Para  3   Term  2   Preterm  1   AB  1   Living  3      SAB  0   IAB  0   Ectopic  1   Multiple  0   Live Births  3           Family History  Problem Relation Age of Onset   Heart disease Mother        CHF   Diabetes Sister    Anesthesia problems Neg Hx     Social History   Tobacco Use   Smoking status: Never   Smokeless tobacco: Never  Vaping Use   Vaping Use: Never used  Substance Use Topics   Alcohol use: No    Comment: occassionally   Drug use: No    Types: Cocaine, Marijuana    Home Medications Prior to Admission medications   Medication Sig Start Date End Date Taking? Authorizing Provider  metoprolol succinate (TOPROL-XL) 25 MG 24 hr tablet Take 1 tablet (25 mg total) by mouth daily. 09/22/20 10/22/20 Yes Luna Fuse, MD  amLODipine (NORVASC) 10 MG tablet Take 1 tablet (10 mg total)  by mouth daily. 01/31/20   Domenic Moras, PA-C  ferrous sulfate 325 (65 FE) MG tablet Take 1 tablet (325 mg total) by mouth 2 (two) times daily with a meal. 02/08/20   Domenic Polite, MD  glipiZIDE (GLUCOTROL) 5 MG tablet Take 1 tablet (5 mg total) by mouth daily before breakfast. 02/08/20 02/07/21  Domenic Polite, MD  hydrALAZINE (APRESOLINE) 25 MG tablet Take 1 tablet (25 mg total) by mouth 2 (two) times daily. 02/08/20   Domenic Polite, MD  insulin aspart (NOVOLOG) 100 UNIT/ML injection Before each meal 3 times a day , 140-199 - 2 units, 200-250 - 4 units, 251-299 - 6 units,  300-349 - 8 units,  350 or above 10 units. Insulin PEN if approved, provide syringes and needles if needed. Can switch to any other cheaper alternative. Patient not taking: Reported on 02/05/2020 08/25/18   Thurnell Lose, MD  Insulin Syringe-Needle U-100 25G X 1" 1 ML MISC For 4  times a day insulin SQ, 1 month supply. Diagnosis E11.65 08/25/18   Thurnell Lose, MD    Allergies    Shellfish allergy and Morphine and related  Review of Systems   Review of Systems  Constitutional:  Negative for fever.  HENT:  Negative for ear pain.   Eyes:  Negative for pain.  Respiratory:  Negative for cough.   Cardiovascular:  Negative for chest pain.  Gastrointestinal:  Negative for abdominal pain.  Genitourinary:  Negative for flank pain.  Musculoskeletal:  Negative for back pain.  Skin:  Negative for rash.  Neurological:  Negative for syncope.   Physical Exam Updated Vital Signs BP (!) 183/93   Pulse (!) 104   Temp 98.2 F (36.8 C) (Oral)   Resp 13   LMP 08/30/2020 (Exact Date)   SpO2 100%   Physical Exam Constitutional:      General: She is not in acute distress.    Appearance: Normal appearance.  HENT:     Head: Normocephalic.     Nose: Nose normal.  Eyes:     Extraocular Movements: Extraocular movements intact.  Cardiovascular:     Rate and Rhythm: Normal rate.  Pulmonary:     Effort: Pulmonary effort is normal.  Musculoskeletal:        General: Normal range of motion.     Cervical back: Normal range of motion.  Neurological:     General: No focal deficit present.     Mental Status: She is alert and oriented to person, place, and time. Mental status is at baseline.     Cranial Nerves: No cranial nerve deficit.     Motor: No weakness.     Gait: Gait normal.    ED Results / Procedures / Treatments   Labs (all labs ordered are listed, but only abnormal results are displayed) Labs Reviewed  LIPASE, BLOOD - Abnormal; Notable for the following components:      Result Value   Lipase 56 (*)    All other components within normal limits  COMPREHENSIVE METABOLIC PANEL - Abnormal; Notable for the following components:   CO2 15 (*)    Glucose, Bld 151 (*)    BUN 37 (*)    Creatinine, Ser 3.49 (*)    Albumin 3.3 (*)    GFR, Estimated 16 (*)    All  other components within normal limits  CBC - Abnormal; Notable for the following components:   Hemoglobin 9.8 (*)    HCT 32.8 (*)    MCH 24.6 (*)  MCHC 29.9 (*)    All other components within normal limits  URINALYSIS, ROUTINE W REFLEX MICROSCOPIC - Abnormal; Notable for the following components:   Glucose, UA 50 (*)    Ketones, ur 5 (*)    Protein, ur >=300 (*)    All other components within normal limits  I-STAT BETA HCG BLOOD, ED ([facility], [facility], AP ONLY) - Abnormal; Notable for the following components:   I-stat hCG, quantitative 7.0 (*)    All other components within normal limits  CBG MONITORING, ED - Abnormal; Notable for the following components:   Glucose-Capillary 150 (*)    All other components within normal limits    EKG EKG Interpretation  Date/Time:  Monday September 22 2020 09:48:27 EDT Ventricular Rate:  93 PR Interval:  150 QRS Duration: 64 QT Interval:  338 QTC Calculation: 420 R Axis:   76 Text Interpretation: Normal sinus rhythm Normal ECG Confirmed by Thamas Jaegers ([id number]) on 09/22/2020 10:26:25 AM  Radiology CT Head Wo Contrast  Result Date: 09/22/2020 CLINICAL DATA:  Acute headache, dizziness, emesis EXAM: CT HEAD WITHOUT CONTRAST TECHNIQUE: Contiguous axial images were obtained from the base of the skull through the vertex without intravenous contrast. COMPARISON:  08/11/2013, 02/19/2012 FINDINGS: Brain: No evidence of acute infarction, hemorrhage, hydrocephalus, extra-axial collection or mass lesion/mass effect. Vascular: No hyperdense vessel or unexpected calcification. Skull: Normal. Negative for fracture or focal lesion. Sinuses/Orbits: No acute finding. Other: None. IMPRESSION: Normal head CT without contrast for age.  No interval change. Electronically Signed   By: Jerilynn Mages.  Shick M.D.   On: 09/22/2020 14:30    Procedures .Critical Care  Date/Time: 09/22/2020 2:58 PM Performed by: Luna Fuse, MD Authorized by: Luna Fuse, MD   Critical care provider  statement:    Critical care time (minutes):  30   Critical care time was exclusive of:  Separately billable procedures and treating other patients   Critical care was necessary to treat or prevent imminent or life-threatening deterioration of the following conditions:  Circulatory failure Comments:     Severely elevated blood pressure, required multiple reassessments and ultimately IV blood pressure medications.   Medications Ordered in ED Medications  sodium chloride 0.9 % bolus 1,000 mL (0 mLs Intravenous Stopped 09/22/20 1206)  ondansetron (ZOFRAN) injection 4 mg (4 mg Intravenous Given 09/22/20 1023)  amLODipine (NORVASC) tablet 10 mg (10 mg Oral Given 09/22/20 1205)  droperidol (INAPSINE) 2.5 MG/ML injection 1.25 mg (1.25 mg Intravenous Given 09/22/20 1137)  hydrALAZINE (APRESOLINE) tablet 25 mg (25 mg Oral Given 09/22/20 1332)  labetalol (NORMODYNE) injection 10 mg (10 mg Intravenous Given 09/22/20 1456)    ED Course  I have reviewed the triage vital signs and the nursing notes.  Pertinent labs & imaging results that were available during my care of the patient were reviewed by me and considered in my medical decision making (see chart for details).    MDM Rules/Calculators/A&P                           While here in the ER patient had several episodes of vomiting nonbloody nonbilious.  She developed a headache here.  Due to her elevated blood pressure, CT imaging pursued with no acute findings per radiology.  Patient given multiple blood pressure medications with improvement of pressure, last blood pressure seen at 532 systolic.  Patient given resources to call to arrange for primary care appointment.   Final Clinical Impression(s) / ED Diagnoses  Final diagnoses:  Dizziness  Hypertension, unspecified type    Rx / DC Orders ED Discharge Orders          Ordered    metoprolol succinate (TOPROL-XL) 25 MG 24 hr tablet  Daily        09/22/20 1500             Luna Fuse, MD 09/22/20 1500

## 2020-09-22 NOTE — ED Triage Notes (Signed)
Pt presents with one day of n/v and dizziness. Denies abdominal pain or sick contacts.

## 2020-09-22 NOTE — ED Notes (Signed)
Pt verbalizes understanding of discharge instructions. Opportunity for questions and answers were provided. Pt discharged from the ED.   ?

## 2020-09-22 NOTE — Discharge Instructions (Signed)
Call your primary care doctor or specialist as discussed in the next 5-7 days.  Return to the ER if:  Your symptoms worsen within the next 12-24 hours. You develop new symptoms such as new fevers, persistent vomiting, new pain, shortness of breath, or new weakness or numbness, or if you have any other concerns.

## 2020-09-22 NOTE — ED Notes (Signed)
Patient transported to CT 

## 2020-09-23 ENCOUNTER — Encounter (HOSPITAL_COMMUNITY): Payer: Self-pay | Admitting: Student

## 2020-09-23 ENCOUNTER — Emergency Department (HOSPITAL_COMMUNITY): Payer: Medicaid Other

## 2020-09-23 LAB — RAPID URINE DRUG SCREEN, HOSP PERFORMED
Amphetamines: NOT DETECTED
Barbiturates: NOT DETECTED
Benzodiazepines: NOT DETECTED
Cocaine: NOT DETECTED
Opiates: NOT DETECTED
Tetrahydrocannabinol: POSITIVE — AB

## 2020-09-23 LAB — RESP PANEL BY RT-PCR (FLU A&B, COVID) ARPGX2
Influenza A by PCR: NEGATIVE
Influenza B by PCR: NEGATIVE
SARS Coronavirus 2 by RT PCR: NEGATIVE

## 2020-09-23 LAB — URINALYSIS, ROUTINE W REFLEX MICROSCOPIC
Bacteria, UA: NONE SEEN
Bilirubin Urine: NEGATIVE
Glucose, UA: 50 mg/dL — AB
Hgb urine dipstick: NEGATIVE
Ketones, ur: 5 mg/dL — AB
Leukocytes,Ua: NEGATIVE
Nitrite: NEGATIVE
Protein, ur: 100 mg/dL — AB
Specific Gravity, Urine: 1.016 (ref 1.005–1.030)
pH: 6 (ref 5.0–8.0)

## 2020-09-23 LAB — COMPREHENSIVE METABOLIC PANEL
ALT: 12 U/L (ref 0–44)
AST: 17 U/L (ref 15–41)
Albumin: 3.4 g/dL — ABNORMAL LOW (ref 3.5–5.0)
Alkaline Phosphatase: 90 U/L (ref 38–126)
Anion gap: 14 (ref 5–15)
BUN: 37 mg/dL — ABNORMAL HIGH (ref 6–20)
CO2: 14 mmol/L — ABNORMAL LOW (ref 22–32)
Calcium: 9.1 mg/dL (ref 8.9–10.3)
Chloride: 107 mmol/L (ref 98–111)
Creatinine, Ser: 3.44 mg/dL — ABNORMAL HIGH (ref 0.44–1.00)
GFR, Estimated: 16 mL/min — ABNORMAL LOW (ref >60)
Glucose, Bld: 224 mg/dL — ABNORMAL HIGH (ref 70–99)
Potassium: 4.4 mmol/L (ref 3.5–5.1)
Sodium: 135 mmol/L (ref 135–145)
Total Bilirubin: 0.8 mg/dL (ref 0.3–1.2)
Total Protein: 8.3 g/dL — ABNORMAL HIGH (ref 6.5–8.1)

## 2020-09-23 LAB — LACTIC ACID, PLASMA: Lactic Acid, Venous: 1.7 mmol/L (ref 0.5–1.9)

## 2020-09-23 LAB — TROPONIN I (HIGH SENSITIVITY)
Troponin I (High Sensitivity): 24 ng/L — ABNORMAL HIGH (ref <18)
Troponin I (High Sensitivity): 25 ng/L — ABNORMAL HIGH (ref <18)

## 2020-09-23 LAB — I-STAT VENOUS BLOOD GAS, ED
Acid-base deficit: 9 mmol/L — ABNORMAL HIGH (ref 0.0–2.0)
Bicarbonate: 16.9 mmol/L — ABNORMAL LOW (ref 20.0–28.0)
Calcium, Ion: 1.12 mmol/L — ABNORMAL LOW (ref 1.15–1.40)
HCT: 28 % — ABNORMAL LOW (ref 36.0–46.0)
Hemoglobin: 9.5 g/dL — ABNORMAL LOW (ref 12.0–15.0)
O2 Saturation: 98 %
Potassium: 4.5 mmol/L (ref 3.5–5.1)
Sodium: 142 mmol/L (ref 135–145)
TCO2: 18 mmol/L — ABNORMAL LOW (ref 22–32)
pCO2, Ven: 35.6 mmHg — ABNORMAL LOW (ref 44.0–60.0)
pH, Ven: 7.285 (ref 7.250–7.430)
pO2, Ven: 119 mmHg — ABNORMAL HIGH (ref 32.0–45.0)

## 2020-09-23 LAB — CBC WITH DIFFERENTIAL/PLATELET
Abs Immature Granulocytes: 0.03 10*3/uL (ref 0.00–0.07)
Basophils Absolute: 0 10*3/uL (ref 0.0–0.1)
Basophils Relative: 0 %
Eosinophils Absolute: 0 10*3/uL (ref 0.0–0.5)
Eosinophils Relative: 0 %
HCT: 31.5 % — ABNORMAL LOW (ref 36.0–46.0)
Hemoglobin: 9.4 g/dL — ABNORMAL LOW (ref 12.0–15.0)
Immature Granulocytes: 0 %
Lymphocytes Relative: 6 %
Lymphs Abs: 0.7 10*3/uL (ref 0.7–4.0)
MCH: 24.7 pg — ABNORMAL LOW (ref 26.0–34.0)
MCHC: 29.8 g/dL — ABNORMAL LOW (ref 30.0–36.0)
MCV: 82.9 fL (ref 80.0–100.0)
Monocytes Absolute: 0.2 10*3/uL (ref 0.1–1.0)
Monocytes Relative: 2 %
Neutro Abs: 10.5 10*3/uL — ABNORMAL HIGH (ref 1.7–7.7)
Neutrophils Relative %: 92 %
Platelets: 311 10*3/uL (ref 150–400)
RBC: 3.8 MIL/uL — ABNORMAL LOW (ref 3.87–5.11)
RDW: 14.1 % (ref 11.5–15.5)
WBC: 11.4 10*3/uL — ABNORMAL HIGH (ref 4.0–10.5)
nRBC: 0 % (ref 0.0–0.2)

## 2020-09-23 LAB — CBG MONITORING, ED
Glucose-Capillary: 178 mg/dL — ABNORMAL HIGH (ref 70–99)
Glucose-Capillary: 208 mg/dL — ABNORMAL HIGH (ref 70–99)

## 2020-09-23 LAB — HCG, SERUM, QUALITATIVE: Preg, Serum: NEGATIVE

## 2020-09-23 LAB — GLUCOSE, CAPILLARY
Glucose-Capillary: 166 mg/dL — ABNORMAL HIGH (ref 70–99)
Glucose-Capillary: 161 mg/dL — ABNORMAL HIGH (ref 70–99)

## 2020-09-23 LAB — BETA-HYDROXYBUTYRIC ACID: Beta-Hydroxybutyric Acid: 1.79 mmol/L — ABNORMAL HIGH (ref 0.05–0.27)

## 2020-09-23 LAB — POC OCCULT BLOOD, ED: Fecal Occult Bld: NEGATIVE

## 2020-09-23 LAB — LIPASE, BLOOD: Lipase: 44 U/L (ref 11–51)

## 2020-09-23 LAB — TSH: TSH: 1.595 u[IU]/mL (ref 0.350–4.500)

## 2020-09-23 MED ORDER — ENOXAPARIN SODIUM 30 MG/0.3ML IJ SOSY
30.0000 mg | PREFILLED_SYRINGE | INTRAMUSCULAR | Status: DC
  Administered 2020-09-23 – 2020-09-25 (×3): 30 mg via SUBCUTANEOUS
  Filled 2020-09-23 (×3): qty 0.3

## 2020-09-23 MED ORDER — ONDANSETRON HCL 4 MG/2ML IJ SOLN
4.0000 mg | Freq: Four times a day (QID) | INTRAMUSCULAR | Status: DC | PRN
  Administered 2020-09-23 (×2): 4 mg via INTRAVENOUS
  Filled 2020-09-23 (×2): qty 2

## 2020-09-23 MED ORDER — ONDANSETRON HCL 4 MG/2ML IJ SOLN
4.0000 mg | Freq: Once | INTRAMUSCULAR | Status: AC
  Administered 2020-09-23: 4 mg via INTRAVENOUS
  Filled 2020-09-23: qty 2

## 2020-09-23 MED ORDER — HYDRALAZINE HCL 25 MG PO TABS
25.0000 mg | ORAL_TABLET | Freq: Three times a day (TID) | ORAL | Status: DC
  Administered 2020-09-23 (×3): 25 mg via ORAL
  Filled 2020-09-23 (×2): qty 1

## 2020-09-23 MED ORDER — ACETAMINOPHEN 650 MG RE SUPP: 650.0000 mg | Freq: Four times a day (QID) | RECTAL | Status: DC | PRN

## 2020-09-23 MED ORDER — LORAZEPAM 2 MG/ML IJ SOLN
1.0000 mg | Freq: Once | INTRAMUSCULAR | Status: AC
  Administered 2020-09-23: 1 mg via INTRAVENOUS
  Filled 2020-09-23: qty 1

## 2020-09-23 MED ORDER — LABETALOL HCL 5 MG/ML IV SOLN
10.0000 mg | INTRAVENOUS | Status: DC | PRN
  Administered 2020-09-23: 10 mg via INTRAVENOUS
  Filled 2020-09-23: qty 4

## 2020-09-23 MED ORDER — ALUM & MAG HYDROXIDE-SIMETH 200-200-20 MG/5ML PO SUSP
30.0000 mL | Freq: Once | ORAL | Status: AC
  Administered 2020-09-23: 30 mL via ORAL
  Filled 2020-09-23: qty 30

## 2020-09-23 MED ORDER — ONDANSETRON 4 MG PO TBDP
4.0000 mg | ORAL_TABLET | Freq: Once | ORAL | Status: AC
  Administered 2020-09-23: 4 mg via ORAL
  Filled 2020-09-23: qty 1

## 2020-09-23 MED ORDER — INSULIN ASPART 100 UNIT/ML IJ SOLN
0.0000 [IU] | Freq: Three times a day (TID) | INTRAMUSCULAR | Status: DC
Start: 2020-09-23 — End: 2020-09-25
  Administered 2020-09-23 – 2020-09-24 (×2): 2 [IU] via SUBCUTANEOUS
  Administered 2020-09-24 – 2020-09-25 (×3): 1 [IU] via SUBCUTANEOUS

## 2020-09-23 MED ORDER — HALOPERIDOL LACTATE 5 MG/ML IJ SOLN
2.0000 mg | Freq: Once | INTRAMUSCULAR | Status: AC
  Administered 2020-09-23: 2 mg via INTRAVENOUS
  Filled 2020-09-23: qty 1

## 2020-09-23 MED ORDER — HYDROXYZINE HCL 25 MG PO TABS
25.0000 mg | ORAL_TABLET | Freq: Three times a day (TID) | ORAL | Status: DC | PRN
  Administered 2020-09-23 – 2020-09-25 (×6): 25 mg via ORAL
  Filled 2020-09-23 (×8): qty 1

## 2020-09-23 MED ORDER — CAPSAICIN 0.025 % EX CREA
TOPICAL_CREAM | Freq: Once | CUTANEOUS | Status: AC
  Filled 2020-09-23: qty 60

## 2020-09-23 MED ORDER — SODIUM CHLORIDE 0.9 % IV BOLUS
1000.0000 mL | Freq: Once | INTRAVENOUS | Status: AC
  Administered 2020-09-23: 1000 mL via INTRAVENOUS

## 2020-09-23 MED ORDER — SODIUM CHLORIDE 0.9 % IV SOLN
12.5000 mg | Freq: Four times a day (QID) | INTRAVENOUS | Status: DC | PRN
  Administered 2020-09-23 – 2020-09-24 (×3): 12.5 mg via INTRAVENOUS
  Filled 2020-09-23 (×2): qty 0.5
  Filled 2020-09-23: qty 12.5
  Filled 2020-09-23: qty 0.5

## 2020-09-23 MED ORDER — ONDANSETRON HCL 4 MG/2ML IJ SOLN: 4.0000 mg | Freq: Once | INTRAMUSCULAR | Status: DC

## 2020-09-23 MED ORDER — AMLODIPINE BESYLATE 10 MG PO TABS
10.0000 mg | ORAL_TABLET | Freq: Every day | ORAL | Status: DC
  Administered 2020-09-23 – 2020-09-24 (×2): 10 mg via ORAL
  Filled 2020-09-23: qty 2
  Filled 2020-09-23: qty 1
  Filled 2020-09-23: qty 2

## 2020-09-23 MED ORDER — LABETALOL HCL 5 MG/ML IV SOLN
10.0000 mg | Freq: Once | INTRAVENOUS | Status: AC
  Administered 2020-09-23: 10 mg via INTRAVENOUS
  Filled 2020-09-23: qty 4

## 2020-09-23 MED ORDER — FAMOTIDINE 20 MG IN NS 100 ML IVPB
20.0000 mg | Freq: Once | INTRAVENOUS | Status: AC
  Administered 2020-09-23: 20 mg via INTRAVENOUS
  Filled 2020-09-23: qty 100

## 2020-09-23 MED ORDER — ACETAMINOPHEN 325 MG PO TABS
650.0000 mg | ORAL_TABLET | Freq: Four times a day (QID) | ORAL | Status: DC | PRN
  Administered 2020-09-25: 650 mg via ORAL
  Filled 2020-09-23: qty 2

## 2020-09-23 MED ORDER — ONDANSETRON HCL 4 MG PO TABS: 4.0000 mg | ORAL_TABLET | Freq: Four times a day (QID) | ORAL | Status: DC | PRN

## 2020-09-23 MED ORDER — MELATONIN 3 MG PO TABS
3.0000 mg | ORAL_TABLET | Freq: Every day | ORAL | Status: DC
  Administered 2020-09-23 – 2020-09-24 (×2): 3 mg via ORAL
  Filled 2020-09-23 (×2): qty 1

## 2020-09-23 MED ORDER — INSULIN DETEMIR 100 UNIT/ML ~~LOC~~ SOLN
10.0000 [IU] | Freq: Every day | SUBCUTANEOUS | Status: DC
  Administered 2020-09-23 – 2020-09-24 (×2): 10 [IU] via SUBCUTANEOUS
  Filled 2020-09-23 (×3): qty 0.1

## 2020-09-23 NOTE — ED Notes (Signed)
Pt observed sleeping at this time. Will continue to monitor.

## 2020-09-23 NOTE — ED Notes (Signed)
Pt ambulatory with fair gait. One person stand by assist for safety.

## 2020-09-23 NOTE — H&P (Addendum)
Date: 09/23/2020               Patient Name:  [name] MRN: [id number]  DOB: 18-Oct-1980 Age / Sex: 40 y.o., female   PCP: Patient, No Pcp Per (Inactive)         Medical Service: Internal Medicine Teaching Service         Attending Physician: Dr. Velna Ochs, MD    First Contact: Farrel Gordon, DO Pager: ED [phone]  Second Contact: Rick Duff, MD Pager: [phone]       After Hours (After 5p/  First Contact Pager: [phone]  weekends / holidays): Second Contact Pager: [phone]   SUBJECTIVE  Chief Complaint: emesis   History of Present Illness: Kaydee Magel is a 40 y.o. female with a pertinent PMH of hypertension, type II diabetes mellitus on insulin, anemia of chronic disease, CKD IV, who presents to [facility] with nausea and vomiting. History obtained from patient and chart review.   Ms Malina Geers presented with three days of nausea and vomiting with associated periumbilical and epigastric abdominal pain. Patient reports that she has been taking all her medications as prescribed; however, is unable to recall which medications she is on. She presented to the ED for similar concerns yesterday and noted that she had been intermittently taking her antihypertensives. She was given amlodipine, hydralazine and IV labetalol with improvement in BP to systolic 299 on discharge and was recommended to start metoprolol succinate 25mg  daily on discharge. However, she notes that her nausea and vomiting persisted. She notes intermittent lightheadedness. She endorses anxiety and requests something to help with her anxiety. She has not had a bowel movement in several days but does endorse passing gas. She denies any fevers, chills, headache, diarrhea, hematemesis, chest pain or shortness of breath.   Medications: No current facility-administered medications on file prior to encounter.   Current Outpatient Medications on File Prior to Encounter  Medication Sig Dispense Refill   acetaminophen  (TYLENOL) 500 MG tablet Take 1,000 mg by mouth every 6 (six) hours as needed for moderate pain or headache.     amLODipine (NORVASC) 10 MG tablet Take 1 tablet (10 mg total) by mouth daily. 30 tablet 0   glipiZIDE (GLUCOTROL) 5 MG tablet Take 1 tablet (5 mg total) by mouth daily before breakfast. 60 tablet 0   hydrALAZINE (APRESOLINE) 25 MG tablet Take 1 tablet (25 mg total) by mouth 2 (two) times daily.  0   metoprolol succinate (TOPROL-XL) 25 MG 24 hr tablet Take 1 tablet (25 mg total) by mouth daily. 30 tablet 0   ferrous sulfate 325 (65 FE) MG tablet Take 1 tablet (325 mg total) by mouth 2 (two) times daily with a meal. (Patient not taking: Reported on 09/23/2020) 60 tablet 0   insulin aspart (NOVOLOG) 100 UNIT/ML injection Before each meal 3 times a day , 140-199 - 2 units, 200-250 - 4 units, 251-299 - 6 units,  300-349 - 8 units,  350 or above 10 units. Insulin PEN if approved, provide syringes and needles if needed. Can switch to any other cheaper alternative. (Patient not taking: No sig reported) 10 mL 0   Insulin Syringe-Needle U-100 25G X 1" 1 ML MISC For 4 times a day insulin SQ, 1 month supply. Diagnosis E11.65 (Patient not taking: Reported on 09/23/2020) 30 each 0    Past Medical History:  Past Medical History:  Diagnosis Date   Anxiety    Asthma    Depression  Diabetes mellitus    Hyperemesis arising during pregnancy    Hypertension    Mental disorder     Social:  Patient lives at home with her husband and son.  She works as a Programmer, systems. Patient reports ongoing marijuana use since the age of 11 with smoking 2 blunts daily. Last use was 3 days ago.  She denies any history of tobacco or alcohol use.  Family History: Family History  Problem Relation Age of Onset   Heart disease Mother        CHF   Diabetes Sister    Anesthesia problems Neg Hx     Allergies: Allergies as of 09/22/2020 - Review Complete 09/22/2020  Allergen Reaction Noted   Shellfish allergy  Anaphylaxis and Swelling 04/05/2012   Morphine and related Nausea And Vomiting 08/03/2010    Review of Systems: A complete ROS was negative except as per HPI.   OBJECTIVE:  Physical Exam: Blood pressure (!) 185/88, pulse (!) 103, temperature 98.5 F (36.9 C), temperature source Oral, resp. rate 16, height 5\' 7"  (1.702 m), weight 90 kg, last menstrual period 08/30/2020, SpO2 100 %. Physical Exam Constitutional:      General: She is in acute distress.     Appearance: She is well-developed. She is not diaphoretic.  HENT:     Head: Normocephalic and atraumatic.     Mouth/Throat:     Mouth: Mucous membranes are moist.     Pharynx: No pharyngeal swelling or oropharyngeal exudate.  Cardiovascular:     Rate and Rhythm: Regular rhythm. Tachycardia present.     Heart sounds: Normal heart sounds. No murmur heard.   No friction rub. No gallop.  Pulmonary:     Effort: Pulmonary effort is normal.     Breath sounds: Normal breath sounds.  Abdominal:     General: Bowel sounds are normal. There is no distension. There are no signs of injury.     Palpations: Abdomen is soft.     Tenderness: There is abdominal tenderness in the epigastric area and periumbilical area.  Skin:    General: Skin is warm and dry.     Capillary Refill: Capillary refill takes less than 2 seconds.  Neurological:     General: No focal deficit present.     Mental Status: She is alert and oriented to person, place, and time.  Psychiatric:        Mood and Affect: Mood is anxious.    Pertinent Labs: CBC    Component Value Date/Time   WBC 11.4 (H) 09/23/2020 0109   RBC 3.80 (L) 09/23/2020 0109   HGB 9.5 (L) 09/23/2020 0815   HCT 28.0 (L) 09/23/2020 0815   PLT 311 09/23/2020 0109   MCV 82.9 09/23/2020 0109   MCH 24.7 (L) 09/23/2020 0109   MCHC 29.8 (L) 09/23/2020 0109   RDW 14.1 09/23/2020 0109   LYMPHSABS 0.7 09/23/2020 0109   MONOABS 0.2 09/23/2020 0109   EOSABS 0.0 09/23/2020 0109   BASOSABS 0.0 09/23/2020  0109     CMP     Component Value Date/Time   NA 142 09/23/2020 0815   K 4.5 09/23/2020 0815   CL 107 09/23/2020 0109   CO2 14 (L) 09/23/2020 0109   GLUCOSE 224 (H) 09/23/2020 0109   BUN 37 (H) 09/23/2020 0109   CREATININE 3.44 (H) 09/23/2020 0109   CREATININE 1.24 (H) 06/17/2011 1051   CALCIUM 9.1 09/23/2020 0109   PROT 8.3 (H) 09/23/2020 0109   ALBUMIN 3.4 (L)  09/23/2020 0109   AST 17 09/23/2020 0109   ALT 12 09/23/2020 0109   ALKPHOS 90 09/23/2020 0109   BILITOT 0.8 09/23/2020 0109   GFRNONAA 16 (L) 09/23/2020 0109   GFRAA 22 (L) 08/08/2019 0314    Pertinent Imaging: CT Abdomen Pelvis Wo Contrast  Result Date: 09/23/2020 CLINICAL DATA:  40 year old female with abdominal pain. Nausea vomiting and dizziness. EXAM: CT ABDOMEN AND PELVIS WITHOUT CONTRAST TECHNIQUE: Multidetector CT imaging of the abdomen and pelvis was performed following the standard protocol without IV contrast. COMPARISON:  CT Abdomen and Pelvis 08/03/2019 and earlier. FINDINGS: Lower chest: Negative. Hepatobiliary: Negative noncontrast liver and gallbladder. Pancreas: Negative. Spleen: Negative. Adrenals/Urinary Tract: Negative adrenal glands. Noncontrast kidneys appear stable and nonobstructed. No nephrolithiasis or pararenal inflammation. Decompressed ureters. Unremarkable bladder. Chronic pelvic phleboliths appear stable since 2018. Stomach/Bowel: Decompressed large and small bowel throughout the abdomen and pelvis. Normal appendix on series 3, image 64. Occasional large bowel diverticula. Decompressed stomach and duodenum. No free air, free fluid, mesenteric inflammation. Vascular/Lymphatic: Normal caliber abdominal aorta. Faint calcified aortic atherosclerosis on series 3, image 48. No lymphadenopathy is evident. Proximal femoral artery calcified atherosclerosis is visible on image 95. Reproductive: Negative noncontrast appearance. Other: Trace pelvic free fluid, likely physiologic. Musculoskeletal: T12 spina bifida  occulta, normal variant. No acute osseous abnormality identified. IMPRESSION: 1. No urinary calculus or obstructive uropathy. Normal appendix. No acute or inflammatory process identified in the noncontrast abdomen or pelvis. 2. Mild but age advanced calcified atherosclerosis of the aorta and proximal femoral arteries. Electronically Signed   By: Genevie Ann M.D.   On: 09/23/2020 06:31   CT Head Wo Contrast  Result Date: 09/22/2020 CLINICAL DATA:  Acute headache, dizziness, emesis EXAM: CT HEAD WITHOUT CONTRAST TECHNIQUE: Contiguous axial images were obtained from the base of the skull through the vertex without intravenous contrast. COMPARISON:  08/11/2013, 02/19/2012 FINDINGS: Brain: No evidence of acute infarction, hemorrhage, hydrocephalus, extra-axial collection or mass lesion/mass effect. Vascular: No hyperdense vessel or unexpected calcification. Skull: Normal. Negative for fracture or focal lesion. Sinuses/Orbits: No acute finding. Other: None. IMPRESSION: Normal head CT without contrast for age.  No interval change. Electronically Signed   By: Jerilynn Mages.  Shick M.D.   On: 09/22/2020 14:30    EKG: on presentation to ED with NSR; repeat EKG this AM with sinus tachycardia and new T wave inversion in leads III and V1.   ASSESSMENT & PLAN:  Assessment: Active Problems:   Intractable nausea and vomiting   Asuka Dusseau is a 40 y.o. with pertinent PMH of hypertension, type II diabetes mellitus on insulin, CKD IV, anemia of chronic disease who presented with nausea and vomiting and admit for intractable nausea and vomiting on hospital day 0  Plan: #Intractable nausea and vomiting Patient presented with several days of intractable nausea and vomiting. She notes associated periumbilical abdominal pain. No fevers/chills, or diarrhea. She has been unable to tolerate oral intake. She is hypertensive and tachycardic on exam. However, abdominal exam is benign with only tenderness to palpation in the periumbilical and  epigastric region without rebound tenderness to guarding. CT Abd/Pelv without acute findings and lipase wnl. Of note, patient has had prior admissions for similar symptoms with last admission in January 2022. Suspected to be in setting of cannabinoid hypermesis syndrome vs gastroparesis. Patient improved with supportive care.  - IV maintenance fluids - IV zofran and phenergan  - GI cocktail  - Consider GI consult if persistent symptoms   #AKI on CKD IV #AGMA mixed NAGMA  Patient has history of chronic renal disease with baseline sCr ~3.0 that was elevated to 3.5 on admission. pH 7.28 with bicarb of 14. Anion gap 14 on admission. Urinalysis with mild ketonuria, likely starvation ketosis in setting of three days of intractable nausea and vomiting and decreased oral intake. No lactic acidosis. Blood glucose 224 and patient is not on SGLT-2 inhibitor, less likely to be in DKA. Suspect metabolic acidosis and AKI to be in setting of intractable vomiting as above. Patient has received 2L NS thus far.  - Continue IV LR 100cc/hr  - F/u beta hydroxybutyrate  - Trend renal function - Recommend follow up with nephrology at discharge   #Severe asymptomatic hypertension Patient has a history of hypertension for which she is prescribed amlodipine and hydralazine. Appears that she has been intermittently taking this. Noted to be hypertensive to >200 on admission that improved following IV labetalol administration. Troponin mildly elevated at 24; suspect to be in setting of demand ischemia. No chest pain or dyspnea.  - Resume home amlodipine 10mg  daily - Increase hydralazine 25mg  to tid  - IV Labetalol 10mg  q2h prn for SBP>220, DBP>110  #Type II diabetes mellitus  Patient reports she is on metformin daily and long acting insulin 20U nightly. She reports blood sugars are usually 100-150's on this regimen; however >200 on presentation which she believes may be causing her symptoms. Does not appear to be in DKA at  this time; she is not on SGLT-2 inhibitor so less concern for euglycemic DKA.  - Levemir 10U daily + SSI with meals  - Increase to home dosing once tolerating PO intake   Best Practice: Diet: Clear liquid diet IVF: Fluids: None, Rate: None VTE: enoxaparin (LOVENOX) injection 30 mg Start: 09/23/20 1000 Code: Full AB: None Status: Observation with expected length of stay less than 2 midnights. Anticipated Discharge Location: Home Barriers to Discharge: Medical stability  Signature: Harvie Heck, MD Internal Medicine Resident, PGY-3 [facility]  Pager: [phone] 10:04 AM, 09/23/2020   Please contact the on call pager after 5 pm and on weekends at [phone].

## 2020-09-23 NOTE — ED Provider Notes (Signed)
[facility] EMERGENCY DEPARTMENT Provider Note   CSN: [id number] Arrival date & time: 09/22/20  2251     History Chief Complaint  Patient presents with   Abdominal Pain   Nausea   Emesis    [name] is a 40 y.o. female with a hx of diabetes mellitus, hypertension, asthma, and depression who returns to the ER today for continued N/V since discharge.  Patient states that she started to feel poorly yesterday with nausea and too numerous to count episodes of emesis.  She relays that she has not been able to keep anything down.  She is having associated periumbilical abdominal pain as well as intermittent lightheadedness.  She states she feels very anxious and is something for her anxiety in the emergency department.  She was seen in the emergency department earlier today, she states that she has not been able to keep anything down since leaving.  She is concerned that there was some blood in her vomit.  Last bowel movement was 2 to 3 days ago and was fairly hard, she is not sure if she has been passing gas.  She denies fever, melena, hematochezia, diarrhea, dysuria, vaginal bleeding, or vaginal discharge.  Patient last smoked marijuana a couple of days ago.  HPI     Past Medical History:  Diagnosis Date   Anxiety    Asthma    Depression    Diabetes mellitus    Hyperemesis arising during pregnancy    Hypertension    Mental disorder     Patient Active Problem List   Diagnosis Date Noted   AKI (acute kidney injury) ([place]) 02/05/2020   Dehydration    Orthostatic hypotension    Abnormal CT of the abdomen    Acute esophagitis    Nausea & vomiting 08/04/2019   Intractable nausea and vomiting 08/03/2019   ARF (acute renal failure) ([place]) 08/24/2018   Uncontrolled type 2 diabetes mellitus with hyperglycemia ([place]) 08/24/2018   DKA (diabetic ketoacidoses) 08/24/2018   Non-intractable vomiting    Sepsis ([place]) 02/15/2016   Generalized abdominal pain 02/15/2016   CAP  (community acquired pneumonia) 02/14/2016   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 pylori infection 11/24/2010   Depression 11/20/2010   Asthma 11/20/2010   Chronic hypertension in pregnancy 11/08/2010   Type 2 diabetes mellitus ([place]) 11/07/2010    Past Surgical History:  Procedure Laterality Date   ESOPHAGOGASTRODUODENOSCOPY (EGD) WITH PROPOFOL N/A 08/07/2019   Procedure: ESOPHAGOGASTRODUODENOSCOPY (EGD) WITH PROPOFOL;  Surgeon: Irene Shipper, MD;  Location: [facility] ENDOSCOPY;  Service: Endoscopy;  Laterality: N/A;   NO PAST SURGERIES     TUBAL LIGATION  06/20/2011   Procedure: POST PARTUM TUBAL LIGATION;  Surgeon: Mora Bellman, MD;  Location: [facility];  Service: Gynecology;  Laterality: Bilateral;  Bilateral post partum tubal ligation     OB History     Gravida  4   Para  3   Term  2   Preterm  1   AB  1   Living  3      SAB  0   IAB  0   Ectopic  1   Multiple  0   Live Births  3           Family History  Problem Relation Age of Onset   Heart disease Mother        CHF   Diabetes Sister    Anesthesia problems Neg Hx  Social History   Tobacco Use   Smoking status: Never   Smokeless tobacco: Never  Vaping Use   Vaping Use: Never used  Substance Use Topics   Alcohol use: No    Comment: occassionally   Drug use: No    Types: Cocaine, Marijuana    Home Medications Prior to Admission medications   Medication Sig Start Date End Date Taking? Authorizing Provider  amLODipine (NORVASC) 10 MG tablet Take 1 tablet (10 mg total) by mouth daily. 01/31/20   Domenic Moras, PA-C  ferrous sulfate 325 (65 FE) MG tablet Take 1 tablet (325 mg total) by mouth 2 (two) times daily with a meal. 02/08/20   Domenic Polite, MD  glipiZIDE (GLUCOTROL) 5 MG tablet Take 1 tablet (5 mg total) by mouth daily before breakfast. 02/08/20 02/07/21  Domenic Polite, MD  hydrALAZINE (APRESOLINE) 25 MG tablet Take 1 tablet (25 mg total) by mouth 2 (two) times  daily. 02/08/20   Domenic Polite, MD  insulin aspart (NOVOLOG) 100 UNIT/ML injection Before each meal 3 times a day , 140-199 - 2 units, 200-250 - 4 units, 251-299 - 6 units,  300-349 - 8 units,  350 or above 10 units. Insulin PEN if approved, provide syringes and needles if needed. Can switch to any other cheaper alternative. Patient not taking: Reported on 02/05/2020 08/25/18   Thurnell Lose, MD  Insulin Syringe-Needle U-100 25G X 1" 1 ML MISC For 4 times a day insulin SQ, 1 month supply. Diagnosis E11.65 08/25/18   Thurnell Lose, MD  metoprolol succinate (TOPROL-XL) 25 MG 24 hr tablet Take 1 tablet (25 mg total) by mouth daily. 09/22/20 10/22/20  Luna Fuse, MD    Allergies    Shellfish allergy and Morphine and related  Review of Systems   Review of Systems  Constitutional:  Negative for fever.  Respiratory:  Negative for shortness of breath.   Cardiovascular:  Negative for chest pain.  Gastrointestinal:  Positive for abdominal pain, nausea and vomiting. Negative for anal bleeding, blood in stool and diarrhea.  Genitourinary:  Negative for dysuria, vaginal bleeding and vaginal discharge.  Neurological:  Positive for light-headedness. Negative for syncope.  Psychiatric/Behavioral:  The patient is nervous/anxious.   All other systems reviewed and are negative.  Physical Exam Updated Vital Signs BP (!) 184/110 (BP Location: Right Arm)   Pulse (!) 111   Temp 98.2 F (36.8 C)   Resp 17   Ht 5\' 7"  (1.702 m)   Wt 90 kg   LMP 08/30/2020 (Exact Date)   SpO2 100%   BMI 31.08 kg/m   Physical Exam Vitals and nursing note reviewed.  Constitutional:      Appearance: She is well-developed. She is not toxic-appearing.  HENT:     Head: Normocephalic and atraumatic.     Mouth/Throat:     Mouth: Mucous membranes are dry.  Eyes:     General:        Right eye: No discharge.        Left eye: No discharge.     Conjunctiva/sclera: Conjunctivae normal.  Cardiovascular:     Rate and  Rhythm: Regular rhythm. Tachycardia present.  Pulmonary:     Effort: Pulmonary effort is normal. No respiratory distress.     Breath sounds: Normal breath sounds. No wheezing, rhonchi or rales.  Abdominal:     General: There is no distension.     Palpations: Abdomen is soft.     Tenderness: There is abdominal tenderness in  the periumbilical area. There is no guarding or rebound.  Genitourinary:    Comments: Chaperone present.  DRE with soft brown stool, no melena, no hematochezia.  Musculoskeletal:     Cervical back: Neck supple.  Skin:    General: Skin is warm and dry.     Findings: No rash.  Neurological:     Mental Status: She is alert.     Comments: Clear speech.   Psychiatric:        Mood and Affect: Mood is anxious. Affect is tearful.        Behavior: Behavior is agitated.    ED Results / Procedures / Treatments   Labs (all labs ordered are listed, but only abnormal results are displayed) Labs Reviewed  CBC WITH DIFFERENTIAL/PLATELET - Abnormal; Notable for the following components:      Result Value   WBC 11.4 (*)    RBC 3.80 (*)    Hemoglobin 9.4 (*)    HCT 31.5 (*)    MCH 24.7 (*)    MCHC 29.8 (*)    Neutro Abs 10.5 (*)    All other components within normal limits  CBG MONITORING, ED - Abnormal; Notable for the following components:   Glucose-Capillary 208 (*)    All other components within normal limits  COMPREHENSIVE METABOLIC PANEL  LIPASE, BLOOD    EKG None  Radiology CT Abdomen Pelvis Wo Contrast  Result Date: 09/23/2020 CLINICAL DATA:  40 year old female with abdominal pain. Nausea vomiting and dizziness. EXAM: CT ABDOMEN AND PELVIS WITHOUT CONTRAST TECHNIQUE: Multidetector CT imaging of the abdomen and pelvis was performed following the standard protocol without IV contrast. COMPARISON:  CT Abdomen and Pelvis 08/03/2019 and earlier. FINDINGS: Lower chest: Negative. Hepatobiliary: Negative noncontrast liver and gallbladder. Pancreas: Negative. Spleen:  Negative. Adrenals/Urinary Tract: Negative adrenal glands. Noncontrast kidneys appear stable and nonobstructed. No nephrolithiasis or pararenal inflammation. Decompressed ureters. Unremarkable bladder. Chronic pelvic phleboliths appear stable since 2018. Stomach/Bowel: Decompressed large and small bowel throughout the abdomen and pelvis. Normal appendix on series 3, image 64. Occasional large bowel diverticula. Decompressed stomach and duodenum. No free air, free fluid, mesenteric inflammation. Vascular/Lymphatic: Normal caliber abdominal aorta. Faint calcified aortic atherosclerosis on series 3, image 48. No lymphadenopathy is evident. Proximal femoral artery calcified atherosclerosis is visible on image 95. Reproductive: Negative noncontrast appearance. Other: Trace pelvic free fluid, likely physiologic. Musculoskeletal: T12 spina bifida occulta, normal variant. No acute osseous abnormality identified. IMPRESSION: 1. No urinary calculus or obstructive uropathy. Normal appendix. No acute or inflammatory process identified in the noncontrast abdomen or pelvis. 2. Mild but age advanced calcified atherosclerosis of the aorta and proximal femoral arteries. Electronically Signed   By: Genevie Ann M.D.   On: 09/23/2020 06:31   CT Head Wo Contrast  Result Date: 09/22/2020 CLINICAL DATA:  Acute headache, dizziness, emesis EXAM: CT HEAD WITHOUT CONTRAST TECHNIQUE: Contiguous axial images were obtained from the base of the skull through the vertex without intravenous contrast. COMPARISON:  08/11/2013, 02/19/2012 FINDINGS: Brain: No evidence of acute infarction, hemorrhage, hydrocephalus, extra-axial collection or mass lesion/mass effect. Vascular: No hyperdense vessel or unexpected calcification. Skull: Normal. Negative for fracture or focal lesion. Sinuses/Orbits: No acute finding. Other: None. IMPRESSION: Normal head CT without contrast for age.  No interval change. Electronically Signed   By: Jerilynn Mages.  Shick M.D.   On:  09/22/2020 14:30    Procedures Procedures   Medications Ordered in ED Medications  ondansetron (ZOFRAN) injection 4 mg (has no administration in time range)  ondansetron (  ZOFRAN-ODT) disintegrating tablet 4 mg (4 mg Oral Given 09/23/20 0036)    ED Course  I have reviewed the triage vital signs and the nursing notes.  Pertinent labs & imaging results that were available during my care of the patient were reviewed by me and considered in my medical decision making (see chart for details).  Clinical Course as of 09/23/20 0643  Tue Sep 23, 2020  0642 Abd pain, emesis [BH]    Clinical Course User Index [BH] Henderly, Britni A, PA-C   MDM Rules/Calculators/A&P                          Patient presents to the ED with complaints of N/V.  Anxious/tearful on arrival, tachycardic, hypertensive and noted to have periumbilical tenderness. No peritoneal signs.    Additional history obtained:  Additional history obtained from chart review & nursing note review.  Seen in the ED earlier today for similar.   Lab Tests:  I Ordered, reviewed, and interpreted labs, which included:  CBC: Anemia similar to prior. Mild leukocytosis.  CMP: BUN/creatinine similar to visit earlier today and to prior ranges. LFTs WNL. Bicarb notably low at 14- similar to earlier today, no anion gap acidosis.  Lipase: WNL Preg test: Negative Fecal occult blood testing: Negative.  UA earlier today without UTI- no urinary sxs therefore not re-ordered.   No melena/hematochezia on DRE, fecal occult negative, hgb/hct similar to prior, do not suspect critical GI bleed at this time. Given periumbilical tenderness with inability to keep fluids down plan for CT A/P. Ativan initially given with mild improvement, EKG with Qtc 450--> haldol ordered as well. Patient is receiving fluids.   04:45: RE-EVAL: Patient sleeping, resting comfortably, feeling improved some. Remains tachycardic/hypertensive. 2nd L of fluid ordered. TSH also  ordered.   Patient remains hypertensive, received labetalol at last visit, will administer this IV.  06:23: RE-EVAL: Patient feels somewhat nauseous and anxious, pain overall fairly resolved at this time.   Imaging Studies ordered:  I ordered imaging studies which included CT A/P, I independently reviewed, formal radiology impression shows:  1. No urinary calculus or obstructive uropathy. Normal appendix. No acute or inflammatory process identified in the noncontrast abdomen or pelvis. 2. Mild but age advanced calcified atherosclerosis of the aorta and proximal femoral arteries.  06:42: Patient care signed out to PA Henderly @ change of shift pending re-assessment and disposition. If patient remains tachycardia and or unable to tolerate PO anticipate admission.   Findings and plan of care discussed with supervising physician Dr. Dayna Barker who has evaluated the patient & is in agreement.   Portions of this note were generated with [employer]. Dictation errors may occur despite best attempts at proofreading.  Final Clinical Impression(s) / ED Diagnoses Final diagnoses:  None    Rx / DC Orders ED Discharge Orders     None        Amaryllis Dyke, PA-C 09/23/20 2979    Mesner, Corene Cornea, MD 09/23/20 [phone]

## 2020-09-23 NOTE — ED Notes (Signed)
Patient ambulated to bathroom with one assist.

## 2020-09-23 NOTE — ED Notes (Addendum)
Pt ambulatory with unsteady gait. One person assist for safety. Pt asked for another dose of ativan. This RN explained what medication are now available for her. She is to notify RN when she feels she is able to take PO medication

## 2020-09-23 NOTE — ED Notes (Signed)
Currently reports nausea. Pharmacy messaged to send Phenergan IV at this time. Waiting for med.

## 2020-09-23 NOTE — ED Provider Notes (Signed)
Care assumed from previous provider. See note for full HPI.  In summation 40 yo here for evaluation of emesis. Seen yesterday for similar for similar complaints. Associated gen ab pain. Appear anxious. Hx of cannabinoid hyperemesis.  WU unremarkable however with persistent emesis. Given home BP meds  Plan on admit  BP (!) 181/66   Pulse (!) 103   Temp 98.5 F (36.9 C) (Oral)   Resp 14   Ht 5\' 7"  (1.702 m)   Wt 90 kg   LMP 08/30/2020 (Exact Date)   SpO2 100%   BMI 31.08 kg/m   Physical Exam Vitals and nursing note reviewed.  Constitutional:      General: She is not in acute distress.    Appearance: She is well-developed. She is not ill-appearing.     Comments: Actively vomiting   HENT:     Head: Atraumatic.  Eyes:     Pupils: Pupils are equal, round, and reactive to light.  Cardiovascular:     Rate and Rhythm: Normal rate.  Pulmonary:     Effort: No respiratory distress.  Abdominal:     General: There is no distension.  Musculoskeletal:        General: Normal range of motion.     Cervical back: Normal range of motion.  Skin:    General: Skin is warm and dry.  Neurological:     General: No focal deficit present.     Mental Status: She is alert.  Psychiatric:        Mood and Affect: Mood normal.    ED Course/Procedures   Clinical Course as of 09/23/20 0912  Tue Sep 23, 2020  0642 Abd pain, emesis [BH]    Clinical Course User Index [BH] Roza Creamer A, PA-C    Procedures  Labs Reviewed  COMPREHENSIVE METABOLIC PANEL - Abnormal; Notable for the following components:      Result Value   CO2 14 (*)    Glucose, Bld 224 (*)    BUN 37 (*)    Creatinine, Ser 3.44 (*)    Total Protein 8.3 (*)    Albumin 3.4 (*)    GFR, Estimated 16 (*)    All other components within normal limits  CBC WITH DIFFERENTIAL/PLATELET - Abnormal; Notable for the following components:   WBC 11.4 (*)    RBC 3.80 (*)    Hemoglobin 9.4 (*)    HCT 31.5 (*)    MCH 24.7 (*)     MCHC 29.8 (*)    Neutro Abs 10.5 (*)    All other components within normal limits  URINALYSIS, ROUTINE W REFLEX MICROSCOPIC - Abnormal; Notable for the following components:   Glucose, UA 50 (*)    Ketones, ur 5 (*)    Protein, ur 100 (*)    All other components within normal limits  RAPID URINE DRUG SCREEN, HOSP PERFORMED - Abnormal; Notable for the following components:   Tetrahydrocannabinol POSITIVE (*)    All other components within normal limits  CBG MONITORING, ED - Abnormal; Notable for the following components:   Glucose-Capillary 208 (*)    All other components within normal limits  I-STAT VENOUS BLOOD GAS, ED - Abnormal; Notable for the following components:   pCO2, Ven 35.6 (*)    pO2, Ven 119.0 (*)    Bicarbonate 16.9 (*)    TCO2 18 (*)    Acid-base deficit 9.0 (*)    Calcium, Ion 1.12 (*)    HCT 28.0 (*)  Hemoglobin 9.5 (*)    All other components within normal limits  CBG MONITORING, ED - Abnormal; Notable for the following components:   Glucose-Capillary 178 (*)    All other components within normal limits  RESP PANEL BY RT-PCR (FLU A&B, COVID) ARPGX2  LIPASE, BLOOD  HCG, SERUM, QUALITATIVE  TSH  BETA-HYDROXYBUTYRIC ACID  LACTIC ACID, PLASMA  POC OCCULT BLOOD, ED    CT Abdomen Pelvis Wo Contrast  Result Date: 09/23/2020 CLINICAL DATA:  40 year old female with abdominal pain. Nausea vomiting and dizziness. EXAM: CT ABDOMEN AND PELVIS WITHOUT CONTRAST TECHNIQUE: Multidetector CT imaging of the abdomen and pelvis was performed following the standard protocol without IV contrast. COMPARISON:  CT Abdomen and Pelvis 08/03/2019 and earlier. FINDINGS: Lower chest: Negative. Hepatobiliary: Negative noncontrast liver and gallbladder. Pancreas: Negative. Spleen: Negative. Adrenals/Urinary Tract: Negative adrenal glands. Noncontrast kidneys appear stable and nonobstructed. No nephrolithiasis or pararenal inflammation. Decompressed ureters. Unremarkable bladder. Chronic  pelvic phleboliths appear stable since 2018. Stomach/Bowel: Decompressed large and small bowel throughout the abdomen and pelvis. Normal appendix on series 3, image 64. Occasional large bowel diverticula. Decompressed stomach and duodenum. No free air, free fluid, mesenteric inflammation. Vascular/Lymphatic: Normal caliber abdominal aorta. Faint calcified aortic atherosclerosis on series 3, image 48. No lymphadenopathy is evident. Proximal femoral artery calcified atherosclerosis is visible on image 95. Reproductive: Negative noncontrast appearance. Other: Trace pelvic free fluid, likely physiologic. Musculoskeletal: T12 spina bifida occulta, normal variant. No acute osseous abnormality identified. IMPRESSION: 1. No urinary calculus or obstructive uropathy. Normal appendix. No acute or inflammatory process identified in the noncontrast abdomen or pelvis. 2. Mild but age advanced calcified atherosclerosis of the aorta and proximal femoral arteries. Electronically Signed   By: Genevie Ann M.D.   On: 09/23/2020 06:31   CT Head Wo Contrast  Result Date: 09/22/2020 CLINICAL DATA:  Acute headache, dizziness, emesis EXAM: CT HEAD WITHOUT CONTRAST TECHNIQUE: Contiguous axial images were obtained from the base of the skull through the vertex without intravenous contrast. COMPARISON:  08/11/2013, 02/19/2012 FINDINGS: Brain: No evidence of acute infarction, hemorrhage, hydrocephalus, extra-axial collection or mass lesion/mass effect. Vascular: No hyperdense vessel or unexpected calcification. Skull: Normal. Negative for fracture or focal lesion. Sinuses/Orbits: No acute finding. Other: None. IMPRESSION: Normal head CT without contrast for age.  No interval change. Electronically Signed   By: Jerilynn Mages.  Shick M.D.   On: 09/22/2020 14:30    MDM  Plan on admit for intractable emesis.  VBG with Ph 7.28, Bicarb 16.9  UDS positive for THC  UA yesterday did not show infection  Bp improving with meds  Teaching to admit        Braeson Rupe A, PA-C 09/23/20 0912    Wyvonnia Dusky, MD 09/23/20 1059

## 2020-09-24 DIAGNOSIS — Z20822 Contact with and (suspected) exposure to covid-19: Secondary | ICD-10-CM | POA: Diagnosis present

## 2020-09-24 DIAGNOSIS — I10 Essential (primary) hypertension: Secondary | ICD-10-CM | POA: Diagnosis not present

## 2020-09-24 DIAGNOSIS — Z833 Family history of diabetes mellitus: Secondary | ICD-10-CM | POA: Diagnosis not present

## 2020-09-24 DIAGNOSIS — I129 Hypertensive chronic kidney disease with stage 1 through stage 4 chronic kidney disease, or unspecified chronic kidney disease: Secondary | ICD-10-CM | POA: Diagnosis present

## 2020-09-24 DIAGNOSIS — Z79899 Other long term (current) drug therapy: Secondary | ICD-10-CM | POA: Diagnosis not present

## 2020-09-24 DIAGNOSIS — N184 Chronic kidney disease, stage 4 (severe): Secondary | ICD-10-CM | POA: Diagnosis present

## 2020-09-24 DIAGNOSIS — F419 Anxiety disorder, unspecified: Secondary | ICD-10-CM | POA: Diagnosis present

## 2020-09-24 DIAGNOSIS — Z8249 Family history of ischemic heart disease and other diseases of the circulatory system: Secondary | ICD-10-CM | POA: Diagnosis not present

## 2020-09-24 DIAGNOSIS — D638 Anemia in other chronic diseases classified elsewhere: Secondary | ICD-10-CM | POA: Diagnosis present

## 2020-09-24 DIAGNOSIS — R1033 Periumbilical pain: Secondary | ICD-10-CM | POA: Diagnosis present

## 2020-09-24 DIAGNOSIS — K219 Gastro-esophageal reflux disease without esophagitis: Secondary | ICD-10-CM | POA: Diagnosis present

## 2020-09-24 DIAGNOSIS — M549 Dorsalgia, unspecified: Secondary | ICD-10-CM | POA: Diagnosis present

## 2020-09-24 DIAGNOSIS — I160 Hypertensive urgency: Secondary | ICD-10-CM | POA: Diagnosis present

## 2020-09-24 DIAGNOSIS — R739 Hyperglycemia, unspecified: Secondary | ICD-10-CM | POA: Diagnosis present

## 2020-09-24 DIAGNOSIS — N179 Acute kidney failure, unspecified: Secondary | ICD-10-CM | POA: Diagnosis present

## 2020-09-24 DIAGNOSIS — R112 Nausea with vomiting, unspecified: Principal | ICD-10-CM

## 2020-09-24 DIAGNOSIS — Z885 Allergy status to narcotic agent status: Secondary | ICD-10-CM | POA: Diagnosis not present

## 2020-09-24 DIAGNOSIS — E872 Acidosis: Secondary | ICD-10-CM | POA: Diagnosis present

## 2020-09-24 DIAGNOSIS — J45909 Unspecified asthma, uncomplicated: Secondary | ICD-10-CM | POA: Diagnosis present

## 2020-09-24 DIAGNOSIS — Z87898 Personal history of other specified conditions: Secondary | ICD-10-CM | POA: Diagnosis not present

## 2020-09-24 DIAGNOSIS — Z794 Long term (current) use of insulin: Secondary | ICD-10-CM | POA: Diagnosis not present

## 2020-09-24 DIAGNOSIS — Z91013 Allergy to seafood: Secondary | ICD-10-CM | POA: Diagnosis not present

## 2020-09-24 DIAGNOSIS — F1290 Cannabis use, unspecified, uncomplicated: Secondary | ICD-10-CM | POA: Diagnosis present

## 2020-09-24 DIAGNOSIS — F32A Depression, unspecified: Secondary | ICD-10-CM | POA: Diagnosis present

## 2020-09-24 DIAGNOSIS — E1122 Type 2 diabetes mellitus with diabetic chronic kidney disease: Secondary | ICD-10-CM | POA: Diagnosis present

## 2020-09-24 LAB — BASIC METABOLIC PANEL
Anion gap: 12 (ref 5–15)
BUN: 32 mg/dL — ABNORMAL HIGH (ref 6–20)
CO2: 16 mmol/L — ABNORMAL LOW (ref 22–32)
Calcium: 8.9 mg/dL (ref 8.9–10.3)
Chloride: 108 mmol/L (ref 98–111)
Creatinine, Ser: 3.29 mg/dL — ABNORMAL HIGH (ref 0.44–1.00)
GFR, Estimated: 17 mL/min — ABNORMAL LOW (ref >60)
Glucose, Bld: 144 mg/dL — ABNORMAL HIGH (ref 70–99)
Potassium: 4.9 mmol/L (ref 3.5–5.1)
Sodium: 136 mmol/L (ref 135–145)

## 2020-09-24 LAB — HEMOGLOBIN A1C
Hgb A1c MFr Bld: 6.4 % — ABNORMAL HIGH (ref 4.8–5.6)
Mean Plasma Glucose: 136.98 mg/dL

## 2020-09-24 LAB — GLUCOSE, CAPILLARY
Glucose-Capillary: 165 mg/dL — ABNORMAL HIGH (ref 70–99)
Glucose-Capillary: 113 mg/dL — ABNORMAL HIGH (ref 70–99)
Glucose-Capillary: 139 mg/dL — ABNORMAL HIGH (ref 70–99)
Glucose-Capillary: 118 mg/dL — ABNORMAL HIGH (ref 70–99)

## 2020-09-24 LAB — CBC
HCT: 28.6 % — ABNORMAL LOW (ref 36.0–46.0)
Hemoglobin: 9.3 g/dL — ABNORMAL LOW (ref 12.0–15.0)
MCH: 25.3 pg — ABNORMAL LOW (ref 26.0–34.0)
MCHC: 32.5 g/dL (ref 30.0–36.0)
MCV: 77.9 fL — ABNORMAL LOW (ref 80.0–100.0)
Platelets: 323 10*3/uL (ref 150–400)
RBC: 3.67 MIL/uL — ABNORMAL LOW (ref 3.87–5.11)
RDW: 14.2 % (ref 11.5–15.5)
WBC: 7.8 10*3/uL (ref 4.0–10.5)
nRBC: 0 % (ref 0.0–0.2)

## 2020-09-24 LAB — HIV ANTIBODY (ROUTINE TESTING W REFLEX): HIV Screen 4th Generation wRfx: NONREACTIVE

## 2020-09-24 MED ORDER — SODIUM CHLORIDE 0.9 % IV SOLN
12.5000 mg | Freq: Three times a day (TID) | INTRAVENOUS | Status: DC
  Administered 2020-09-24 – 2020-09-25 (×4): 12.5 mg via INTRAVENOUS
  Filled 2020-09-24 (×3): qty 0.5
  Filled 2020-09-24: qty 12.5
  Filled 2020-09-24: qty 0.5
  Filled 2020-09-24: qty 12.5

## 2020-09-24 MED ORDER — HYDRALAZINE HCL 20 MG/ML IJ SOLN
5.0000 mg | Freq: Four times a day (QID) | INTRAMUSCULAR | Status: DC
  Administered 2020-09-24 – 2020-09-25 (×4): 5 mg via INTRAVENOUS
  Filled 2020-09-24 (×4): qty 1

## 2020-09-24 MED ORDER — LACTATED RINGERS IV SOLN: INTRAVENOUS | Status: DC

## 2020-09-24 MED ORDER — SODIUM CHLORIDE 0.9 % IV SOLN
12.5000 mg | Freq: Four times a day (QID) | INTRAVENOUS | Status: DC | PRN
  Filled 2020-09-24: qty 0.5

## 2020-09-24 MED ORDER — METOCLOPRAMIDE HCL 5 MG/ML IJ SOLN
10.0000 mg | Freq: Three times a day (TID) | INTRAMUSCULAR | Status: DC
  Administered 2020-09-24 – 2020-09-25 (×4): 10 mg via INTRAVENOUS
  Filled 2020-09-24 (×4): qty 2

## 2020-09-24 MED ORDER — SUMATRIPTAN 20 MG/ACT NA SOLN
20.0000 mg | Freq: Once | NASAL | Status: AC
  Administered 2020-09-24: 20 mg via NASAL
  Filled 2020-09-24: qty 1

## 2020-09-24 MED ORDER — METOPROLOL TARTRATE 5 MG/5ML IV SOLN
5.0000 mg | Freq: Four times a day (QID) | INTRAVENOUS | Status: DC
  Administered 2020-09-24 – 2020-09-25 (×4): 5 mg via INTRAVENOUS
  Filled 2020-09-24 (×4): qty 5

## 2020-09-24 MED ORDER — LORAZEPAM 2 MG/ML IJ SOLN: 1.0000 mg | Freq: Once | INTRAMUSCULAR | Status: DC

## 2020-09-24 MED ORDER — PANTOPRAZOLE SODIUM 40 MG IV SOLR
40.0000 mg | Freq: Every evening | INTRAVENOUS | Status: DC
  Administered 2020-09-24: 40 mg via INTRAVENOUS
  Filled 2020-09-24: qty 40

## 2020-09-24 MED ORDER — HYDRALAZINE HCL 50 MG PO TABS
50.0000 mg | ORAL_TABLET | Freq: Three times a day (TID) | ORAL | Status: DC
  Administered 2020-09-24: 50 mg via ORAL
  Filled 2020-09-24: qty 1

## 2020-09-24 MED ORDER — LORAZEPAM 2 MG/ML IJ SOLN
1.0000 mg | Freq: Once | INTRAMUSCULAR | Status: AC
  Administered 2020-09-24: 1 mg via INTRAVENOUS
  Filled 2020-09-24: qty 1

## 2020-09-24 MED ORDER — CALCIUM CARBONATE ANTACID 500 MG PO CHEW
1.0000 | CHEWABLE_TABLET | Freq: Three times a day (TID) | ORAL | Status: DC | PRN
  Administered 2020-09-25: 200 mg via ORAL
  Filled 2020-09-24: qty 1

## 2020-09-24 NOTE — Hospital Course (Addendum)
[name] is a 40 year old female with a past medical history of hypertension, type II diabetes mellitus on insulin, anemia of chronic disease, and CKD Stage IV who presented 2 days ago with nausea and vomiting and was admitted for intractable nausea and vomiting and AKI.  #Intractable nausea and vomiting Patient presented with several days of intractable nausea and vomiting. She notes associated periumbilical abdominal pain. No fevers/chills, or diarrhea. She has been unable to tolerate oral intake. She is hypertensive and tachycardic on exam. However, abdominal exam is benign with only tenderness to palpation in the periumbilical and epigastric region without rebound tenderness to guarding. CT Abd/Pelv without acute findings and lipase wnl. Of note, patient has had prior admissions for similar symptoms with last admission in January 2022, at which time she improved with supportive care. Suspected to be in setting of cannabinoid hypermesis syndrome vs gastroparesis. On 9/7, the patient said the nausea and vomiting had not improved. IV maintenance fluids needed and Zofran discontinued. QTc was normal. IV Reglan 10mg  TID, IV Phenergan scheduled and PRN, and 1 dose of intranasal sumatriptan 20mg  given. Discussed marijuana cessation. On 9/8, the patient stated she is feeling better and has not vomited as much. She was able to tolerate Jello, and she was able to eat lunch consisting of pork chops and sweet potatoes.   #AKI on CKD IV #AGMA mixed NAGMA Patient has history of chronic renal disease with baseline sCr ~3.0 that was elevated to 3.5 on admission. pH 7.28 with bicarb of 14. Anion gap 14 on admission. Urinalysis with mild ketonuria, likely starvation ketosis in setting of three days of intractable nausea and vomiting and decreased oral intake. No lactic acidosis. Elevated beta hydroxybutyrate. Blood glucose 224 and patient is not on SGLT-2 inhibitor, less likely to be in DKA. Suspect metabolic acidosis  and AKI to be in setting of intractable vomiting as above. Patient received 2L NS. IV maintenance fluids needed. IV LR 100cc/hr administered continuously. She will need to follow up with nephrology at discharge.     #Severe asymptomatic hypertension Patient has a history of hypertension for which she is prescribed amlodipine and hydralazine. Appears that she has been intermittently taking this. Noted to be hypertensive to >200 on admission that improved following IV labetalol administration. Troponin mildly elevated at 24; suspect to be in setting of demand ischemia. No chest pain or dyspnea. Continued IV labetalol 10mg  PRN, amlodipine PO 10mg  daily, and the PO hydralazine dosing was increased to 50mg  TID. Times were changed for PO BP meds. They will instead be given after nausea meds to hopefully improve tolerance.  #Type II diabetes mellitus  Patient reports she is on metformin daily and long acting insulin 20U nightly. She reports blood sugars are usually 100-150's on this regimen; however >200 on presentation which she believes may be causing her symptoms. Does not appear to be in DKA at this time; she is not on SGLT-2 inhibitor so less concern for euglycemic DKA. Levemir 10 units daily being given. SSI with meals.   #Anxiety/Depression The patient reports increased anxiety since hospitalization. She was given one dose of IV ativan 1mg  earlier this morning and has asked for additional doses. She says that marijuana use "helps her deal with life", and she has smoked 2-3 blunts per day since she was 40 years old. Suspect cannabinoid hyperemesis syndrome, so reducing marijuana use could lessen risk of repeated episodes of nausea and vomiting. Some of her marijuana use may be an attempt to self-medicate, so consider trial SSRI  after discharge.

## 2020-09-24 NOTE — Progress Notes (Addendum)
Subjective:  [name] is a 40 year old female with a past medical history of hypertension, type II diabetes mellitus on insulin, anemia of chronic disease, and CKD Stage IV who presented 2 days ago with nausea and vomiting and was admitted for intractable nausea and vomiting and AKI. She is not doing well this morning and says she has not improved at all over the last few days. She appeared to be in distress, and dry-heaving could be heard from the hallway. She said lying down gives her mild relief and sleeping is the only thing that helps her feel better. Frequency of vomiting has not increased or decreased, but she says her vomit appeared red earlier this morning. She has vomited any time she has tried to eat or drink, but she has needed to vomit at other times too. She has not been able to take PO medications. She is having heartburn and says it is because of the vomiting because she has never had heartburn previously. She has not had a bowel movement and has stopped passing gas.   Objective:  Vital signs in last 24 hours: Vitals:   09/23/20 2144 09/24/20 0449 09/24/20 0500 09/24/20 0933  BP: (!) 161/84 (!) 163/85  (!) 189/101  Pulse: (!) 101 97  (!) 101  Resp: 18 17  17   Temp: 98.3 F (36.8 C) 98.2 F (36.8 C)  98.4 F (36.9 C)  TempSrc: Oral Oral    SpO2: 98% 100%  100%  Weight: 90 kg  90 kg   Height:       Weight change: 0.003 kg  Intake/Output Summary (Last 24 hours) at 09/24/2020 1000 Last data filed at 09/24/2020 0813 Gross per 24 hour  Intake 690.42 ml  Output 0 ml  Net 690.42 ml    Physical Exam Vitals reviewed.  Constitutional:      General: She is in acute distress.     Appearance: She is ill-appearing.  HENT:     Head: Normocephalic and atraumatic.  Cardiovascular:     Rate and Rhythm: Regular rhythm. Tachycardia present.  Pulmonary:     Comments: Effort increased Abdominal:     Tenderness: There is abdominal tenderness in the suprapubic area.  Skin:     General: Skin is warm and dry.  Neurological:     General: No focal deficit present.     Mental Status: She is alert and oriented to person, place, and time.  Psychiatric:        Mood and Affect: Mood is anxious.     Assessment/Plan:  Active Problems:   Intractable nausea and vomiting  #Intractable nausea and vomiting The patient has had several days of nausea and vomiting and reports no improvement. She cannot tolerate oral intake and has not been having bowel movements. Symptoms likely secondary to cannabinoid hyperemesis syndrome vs gastroparesis given history of daily marijuana use. - IV maintenance fluids - IV Reglan 10mg  TID - IV Phenergan scheduled and PRN dosing - 1 dose of intranasal sumatriptan 20mg  - Discussed marijuana cessation - Consider GI consult if persistent symptoms  #AKI on CKD IV #AGMA mixed NAGMA The patient has history of CKD. sCr was 3.5 at admission, and sCr 3.29 today. Slight improvements in BUN and GFR. Ketones in urine likely in the setting of starvation ketosis with the days of nausea and vomiting with poor oral intake. - Continue IV LR 100cc/hr - Follow up beta-hydroxybutyrate - Trend renal function - Recommend follow up with nephrology at discharge  # Hypertensive  urgency  The patient has hypertension and takes amlodipine and hydralazine. She was hypertensive with SBP >200 on admission. Improved following IV labetalol. She will resume home medications, and the times were changed on the PO BP meds so they will be given after the nausea meds, and hopefully this will improve tolerance. - Continue IV labetalol 10mg  PRN - Continue PO amlodipine 10mg  daily - PO hydralazine increased to 50mg   #Type II diabetes mellitus Patient blood glucose >200 when admitted. She has not been able to tolerate food or drinks, but blood glucose has remained high (144 at 02:57).  - Levemir 10 units daily + SSI with meals  #Anxiety/Depression The patient reports increased  anxiety since hospitalization. She was given one dose of IV ativan 1mg  earlier this morning and has asked for additional doses. She says that marijuana use "helps her deal with life", and she has smoked 2-3 blunts per day since she was 40 years old. Suspect cannabinoid hyperemesis syndrome, so reducing marijuana use could lessen risk of repeated episodes of nausea and vomiting .  - Trial SSRI after discharge  Best Practice: Diet: Clear liquid diet IVF: Fluids: LR Rate: 142mL/hr VTE: enoxaparin (LOVENOX) injection 30 mg Start: 09/23/20 1000 Code: Full AB: None Status: Inpatient with expected length of stay less than 2 midnights. Anticipated Discharge Location: Home Barriers to Discharge: Medical stability    LOS: 0 days   Danie Chandler, Medical Student 09/24/2020, 10:00 AM

## 2020-09-25 ENCOUNTER — Other Ambulatory Visit (HOSPITAL_COMMUNITY): Payer: Self-pay

## 2020-09-25 DIAGNOSIS — Z87898 Personal history of other specified conditions: Secondary | ICD-10-CM | POA: Diagnosis not present

## 2020-09-25 DIAGNOSIS — R112 Nausea with vomiting, unspecified: Secondary | ICD-10-CM | POA: Diagnosis not present

## 2020-09-25 DIAGNOSIS — I10 Essential (primary) hypertension: Secondary | ICD-10-CM | POA: Diagnosis not present

## 2020-09-25 DIAGNOSIS — F1291 Cannabis use, unspecified, in remission: Secondary | ICD-10-CM

## 2020-09-25 LAB — CBC
HCT: 26.6 % — ABNORMAL LOW (ref 36.0–46.0)
Hemoglobin: 8.5 g/dL — ABNORMAL LOW (ref 12.0–15.0)
MCH: 25.2 pg — ABNORMAL LOW (ref 26.0–34.0)
MCHC: 32 g/dL (ref 30.0–36.0)
MCV: 78.9 fL — ABNORMAL LOW (ref 80.0–100.0)
Platelets: 304 10*3/uL (ref 150–400)
RBC: 3.37 MIL/uL — ABNORMAL LOW (ref 3.87–5.11)
RDW: 14.1 % (ref 11.5–15.5)
WBC: 7.4 10*3/uL (ref 4.0–10.5)
nRBC: 0 % (ref 0.0–0.2)

## 2020-09-25 LAB — BASIC METABOLIC PANEL
Anion gap: 8 (ref 5–15)
BUN: 32 mg/dL — ABNORMAL HIGH (ref 6–20)
CO2: 16 mmol/L — ABNORMAL LOW (ref 22–32)
Calcium: 8.6 mg/dL — ABNORMAL LOW (ref 8.9–10.3)
Chloride: 109 mmol/L (ref 98–111)
Creatinine, Ser: 3.39 mg/dL — ABNORMAL HIGH (ref 0.44–1.00)
GFR, Estimated: 17 mL/min — ABNORMAL LOW (ref >60)
Glucose, Bld: 160 mg/dL — ABNORMAL HIGH (ref 70–99)
Potassium: 4 mmol/L (ref 3.5–5.1)
Sodium: 133 mmol/L — ABNORMAL LOW (ref 135–145)

## 2020-09-25 LAB — GLUCOSE, CAPILLARY
Glucose-Capillary: 132 mg/dL — ABNORMAL HIGH (ref 70–99)
Glucose-Capillary: 136 mg/dL — ABNORMAL HIGH (ref 70–99)

## 2020-09-25 MED ORDER — LIDOCAINE VISCOUS HCL 2 % MT SOLN
15.0000 mL | Freq: Once | OROMUCOSAL | Status: AC
  Administered 2020-09-25: 15 mL via ORAL
  Filled 2020-09-25: qty 15

## 2020-09-25 MED ORDER — AMLODIPINE BESYLATE 10 MG PO TABS
10.0000 mg | ORAL_TABLET | Freq: Every day | ORAL | Status: DC
  Administered 2020-09-25: 10 mg via ORAL
  Filled 2020-09-25: qty 1

## 2020-09-25 MED ORDER — ONDANSETRON 4 MG PO TBDP
4.0000 mg | ORAL_TABLET | Freq: Three times a day (TID) | ORAL | 0 refills | Status: DC | PRN
  Filled 2020-09-25: qty 20, 7d supply, fill #0

## 2020-09-25 MED ORDER — METOPROLOL SUCCINATE ER 25 MG PO TB24
25.0000 mg | ORAL_TABLET | Freq: Every day | ORAL | 0 refills | Status: DC
  Filled 2020-09-25: qty 30, 30d supply, fill #0

## 2020-09-25 MED ORDER — HYDRALAZINE HCL 25 MG PO TABS
25.0000 mg | ORAL_TABLET | Freq: Two times a day (BID) | ORAL | 0 refills | Status: DC
  Filled 2020-09-25: qty 60, 30d supply, fill #0

## 2020-09-25 MED ORDER — LIDOCAINE 5 % EX PTCH
1.0000 | MEDICATED_PATCH | Freq: Every day | CUTANEOUS | Status: DC
  Administered 2020-09-25: 1 via TRANSDERMAL
  Filled 2020-09-25: qty 1

## 2020-09-25 MED ORDER — HYDRALAZINE HCL 20 MG/ML IJ SOLN: 5.0000 mg | Freq: Four times a day (QID) | INTRAMUSCULAR | Status: DC | PRN

## 2020-09-25 MED ORDER — METOPROLOL SUCCINATE ER 25 MG PO TB24
25.0000 mg | ORAL_TABLET | Freq: Every day | ORAL | Status: DC
  Administered 2020-09-25: 25 mg via ORAL
  Filled 2020-09-25: qty 1

## 2020-09-25 MED ORDER — AMLODIPINE BESYLATE 10 MG PO TABS
10.0000 mg | ORAL_TABLET | Freq: Every day | ORAL | 0 refills | Status: DC
  Filled 2020-09-25: qty 30, 30d supply, fill #0

## 2020-09-25 MED ORDER — HYDRALAZINE HCL 25 MG PO TABS
25.0000 mg | ORAL_TABLET | Freq: Three times a day (TID) | ORAL | Status: DC
  Administered 2020-09-25: 25 mg via ORAL
  Filled 2020-09-25: qty 1

## 2020-09-25 MED ORDER — METOCLOPRAMIDE HCL 10 MG PO TABS
10.0000 mg | ORAL_TABLET | Freq: Three times a day (TID) | ORAL | 0 refills | Status: DC
  Filled 2020-09-25: qty 21, 7d supply, fill #0

## 2020-09-25 MED ORDER — CYCLOBENZAPRINE HCL 5 MG PO TABS
5.0000 mg | ORAL_TABLET | Freq: Once | ORAL | Status: AC
  Administered 2020-09-25: 5 mg via ORAL
  Filled 2020-09-25: qty 1

## 2020-09-25 MED ORDER — ALUM & MAG HYDROXIDE-SIMETH 200-200-20 MG/5ML PO SUSP
30.0000 mL | Freq: Once | ORAL | Status: AC
  Administered 2020-09-25: 30 mL via ORAL
  Filled 2020-09-25: qty 30

## 2020-09-25 NOTE — Progress Notes (Signed)
DISCHARGE NOTE HOME [name] to be discharged Home per MD order. Discussed prescriptions and follow up appointments with the patient. Prescriptions given to patient; medication list explained in detail. Patient verbalized understanding.  Skin clean, dry and intact without evidence of skin break down, no evidence of skin tears noted. IV catheter discontinued intact. Site without signs and symptoms of complications. Dressing and pressure applied. Pt denies pain at the site currently. No complaints noted.  Patient free of lines, drains, and wounds.   An After Visit Summary (AVS) was printed and given to the patient. Patient escorted via wheelchair, and discharged home via private auto.  Arlyss Repress, RN

## 2020-09-25 NOTE — Discharge Summary (Signed)
Name: [name] MRN: [id number] DOB: 03/02/1980 40 y.o. PCP: Patient, No Pcp Per (Inactive)  Date of Admission: 09/22/2020 11:17 PM Date of Discharge: 09/25/20 Attending Physician: Velna Ochs, MD  Discharge Diagnosis: 1. Intractable nausea vomiting 2. Acute on chronic kidney injury 3. Hypertensive urgency  Discharge Medications: Allergies as of 09/25/2020       Reactions   Shellfish Allergy Anaphylaxis, Swelling   Morphine And Related Nausea And Vomiting        Medication List     TAKE these medications    acetaminophen 500 MG tablet Commonly known as: TYLENOL Take 1,000 mg by mouth every 6 (six) hours as needed for moderate pain or headache.   amLODipine 10 MG tablet Commonly known as: NORVASC Take 1 tablet (10 mg total) by mouth daily.   ferrous sulfate 325 (65 FE) MG tablet Take 1 tablet (325 mg total) by mouth 2 (two) times daily with a meal.   glipiZIDE 5 MG tablet Commonly known as: GLUCOTROL Take 1 tablet (5 mg total) by mouth daily before breakfast.   hydrALAZINE 25 MG tablet Commonly known as: APRESOLINE Take 1 tablet (25 mg total) by mouth 2 (two) times daily.   insulin aspart 100 UNIT/ML injection Commonly known as: NovoLOG Before each meal 3 times a day , 140-199 - 2 units, 200-250 - 4 units, 251-299 - 6 units,  300-349 - 8 units,  350 or above 10 units. Insulin PEN if approved, provide syringes and needles if needed. Can switch to any other cheaper alternative.   Insulin Syringe-Needle U-100 25G X 1" 1 ML Misc For 4 times a day insulin SQ, 1 month supply. Diagnosis E11.65   metoCLOPramide 10 MG tablet Commonly known as: REGLAN Take 1 tablet (10 mg total) by mouth 3 (three) times daily with meals for 7 days.   metoprolol succinate 25 MG 24 hr tablet Commonly known as: TOPROL-XL Take 1 tablet (25 mg total) by mouth daily.   ondansetron 4 MG disintegrating tablet Commonly known as: Zofran ODT Take 1 tablet (4 mg total) by mouth every 8  (eight) hours as needed for nausea or vomiting.        Disposition and follow-up:   Ms.Panayiota Deshotel was discharged from [facility] in Stable condition.  At the hospital follow up visit please address:  1. Intractable nausea vomiting 2. Acute on chronic kidney injury 3. Hypertensive urgency  2.  Labs / imaging needed at time of follow-up: Consider BMP to f/u bicarb, electrolytes, kidney function  3.  Pending labs/ test needing follow-up: none  Follow-up Appointments: [facility] hospital follow up appointment  Hospital Course by problem list:  Floreine Kingdon is a 40 year old female with a past medical history of hypertension, type II diabetes mellitus on insulin, anemia of chronic disease, and CKD Stage IV, and cannabis misuse who presented 2 days ago with nausea and vomiting and was admitted for intractable nausea and vomiting and AKI.  #Intractable nausea and vomiting #Cannabis Misuse The patient presented on 9/6 to [facility] ED with 3 days of nausea and vomiting with epigastric pain, poor PO intake, not able to take PO medications for HTN. Is passing gas but not having recent BMs. Pregnancy tests negative. Differential diagnosis included cyclical vomiting/cannabinoid hyperemesis syndrome vs diabetic gastroparesis vs HTN urgency causing nausea/vomiting. Favoring cannabinoid hyperemesis since patient reports smoking 3 joints a day since the age of 26 or 57 and this is her third admission with similar presentation in the last few months  with intermittent improvement in nausea/vomiting. Patient was given antiemetics and IVFs. EKG showed normal Qtc 419ms. On 9/7 added scheduled Reglan TID. 9/8 patient endorses 24hrs without nausea, is hungry. Tolerated jello. Started on soft diet. Patient is tolerating PO intake without further emesis. We counseled on the importance of marijuana smoking cessation. Patient will discharge on 1 week of Reglan TID PO and Zofran PRN for nausea. EKG on day of  discharge with Qtc of 428ms. She is stable for discharge 9/8. She will follow up with [place] in clinic at [facility].   #AKI on CKD IV #AGMA mixed NAGMA The patient has history of CKD. sCr was 3.5 at admission  37 BUN , and at discharge sCr 3.39 BUN 32, baseline sCR around 2-4. GFR 17 improved from 16 on admission. Ketones in urine likely in the setting of starvation ketosis given few days of nausea and vomiting with poor oral intake. Patient received IVF and is now tolerating PO fluid intake. No electrolyte abnormalities, bicarb improving. Patient should be referred to nephrology outpatient for CKD management.   #Hypertensive urgency  #Hx HTN The patient has hypertension and takes amlodipine and hydralazine. She was hypertensive with SBP >200 on admission. HTN could have contributed to n/v symptoms. Improved Bps following IV labetalol. Patient was restarted on home PO HTN meds: amlodipine 10 daily, hydralazine 25 BID, metoprolol 25 daily with PRNs. Bps improving. Patient amendable to close follow up with [place] in [facility] for further BP management.   #GERD Patient having heart burn likely secondary to emesis. She was started on IV protonix 40mg  daily and given GI cocktail on 9/6 and 9/8. No further heartburn reported. No further medical management required.   #Type II diabetes mellitus Patient blood glucose >200 on admission. She was started on SSI and her long acting insulin 10u daily home dose with improvement in blood glucose control.   #Anxiety/Depression Patient has hx of anxiety and depression. The patient reports increased anxiety since hospitalization. She says that marijuana use "helps her deal with life", and she has smoked 2-3 blunts per day since she was 40 years old. Given melatonin and hydroxyzine while admitted. Patient will likely need to be trialed on SSRI after this admission. Patient will need to follow up with PCP to start on SSRI. SSRI can cause nausea, will want to start after her current nausea  symptoms have resolved.     #Musculoskeletal back pain Patient endorsing back pain from prolonged sitting. Lidocaine patch did not improve symptoms. Was given flexeril 5mg  once and encouraged to ambulate. No further medical management required.  Subjective on day of Discharge: No acute events or concerns overnight. Did not require PRNs for BP. Patient denies nausea and vomiting last 24hrs. States she is hungry. She tolerated jello, was encouraged to try soft food today. No BM yet. She wants medication for back pain, lidocaine patch did not help. Was encouraged to ambulate. Patient was informed if she is tolerating PO we may discharge today.  Patient tolerating PO intake. Agrees to follow up with [place] in [facility]. Needs refills of BP meds.  Discharge Exam:   BP (!) 170/103   Pulse 88   Temp 98.2 F (36.8 C) (Oral)   Resp 16   Ht 5\' 7"  (1.702 m)   Wt 90.3 kg   LMP 08/30/2020 (Exact Date)   SpO2 99%   BMI 31.18 kg/m  Physical Exam: General: Well appearing african [place] female, NAD HENT: normocephalic, atraumatic EYES: conjunctiva non-erythematous, no scleral icterus CV: regular rate,  normal rhythm, no murmurs, rubs, gallops. Pulmonary: sating well on RA, lung clear to auscultation, no rales, wheezes, rhonchi Abdominal: non-distended, soft, non-tender to palpation, normal BS Skin: Warm and dry, no rashes or lesions Neurological: MS: awake, alert and oriented x3, normal speech and fund of knowledge Motor: moves all extremities antigravity Psych: normal affect   Pertinent Labs, Studies, and Procedures:  CBC Latest Ref Rng & Units 09/25/2020 09/24/2020 09/23/2020  WBC 4.0 - 10.5 K/uL 7.4 7.8 -  Hemoglobin 12.0 - 15.0 g/dL 8.5(L) 9.3(L) 9.5(L)  Hematocrit 36.0 - 46.0 % 26.6(L) 28.6(L) 28.0(L)  Platelets 150 - 400 K/uL 304 323 -   BMP Latest Ref Rng & Units 09/25/2020 09/24/2020 09/23/2020  Glucose 70 - 99 mg/dL 160(H) 144(H) -  BUN 6 - 20 mg/dL 32(H) 32(H) -  Creatinine 0.44 - 1.00 mg/dL  3.39(H) 3.29(H) -  Sodium 135 - 145 mmol/L 133(L) 136 142  Potassium 3.5 - 5.1 mmol/L 4.0 4.9 4.5  Chloride 98 - 111 mmol/L 109 108 -  CO2 22 - 32 mmol/L 16(L) 16(L) -  Calcium 8.9 - 10.3 mg/dL 8.6(L) 8.9 -   Urinalysis, Routine w reflex microscopic [[id number]] (Abnormal) Collected: 09/22/20 1030  Specimen: Urine Updated: 09/23/20 0812   Color, Urine YELLOW   APPearance CLEAR   Specific Gravity, Urine 1.016   pH 6.0   Glucose, UA 50 Abnormal  mg/dL    Hgb urine dipstick NEGATIVE   Bilirubin Urine NEGATIVE   Ketones, ur 5 Abnormal  mg/dL    Protein, ur 100 Abnormal  mg/dL    Nitrite NEGATIVE   Leukocytes,Ua NEGATIVE   RBC / HPF 0-5 RBC/hpf    WBC, UA 0-5 WBC/hpf    Bacteria, UA NONE SEEN   Squamous Epithelial / LPF 0-5   TSH [[id number]] Collected: 09/23/20 0119  Specimen: Blood Updated: 09/23/20 0713   TSH 1.595 uIU/mL    hCG, serum, qualitative [[id number]] Collected: 09/23/20 0109  Specimen: Blood from Vein Updated: 09/23/20 0525   Preg, Serum NEGATIVE   Rapid urine drug screen (hospital performed) [[id number]] (Abnormal) Collected: 09/22/20 1030  Specimen: Urine, Clean Catch Updated: 09/23/20 0815   Opiates NONE DETECTED   Cocaine NONE DETECTED   Benzodiazepines NONE DETECTED   Amphetamines NONE DETECTED   Tetrahydrocannabinol POSITIVE Abnormal    Barbiturates NONE DETECTED   Hemoglobin A1c [[id number]] (Abnormal) Collected: 09/24/20 0257  Specimen: Blood Updated: 09/24/20 0417   Hgb A1c MFr Bld 6.4 High  %    Mean Plasma Glucose 136.98 mg/dL     Discharge Instructions: Discharge Instructions     Call MD for:  persistant nausea and vomiting   Complete by: As directed    Call MD for:  temperature >100.4   Complete by: As directed        Signed: Wayland Denis, MD 09/25/2020, 3:13 PM   Pager: [phone]

## 2020-09-25 NOTE — TOC Transition Note (Signed)
Transition of Care [facility]) - CM/SW Discharge Note   Patient Details  Name: Deryn Massengale MRN: [id number] Date of Birth: 01/13/1981  [facility]) CM/SW Contact:  Tom-Johnson, Renea Ee, RN Phone Number: 09/25/2020, 4:24 PM   Clinical Narrative:    CM spoke with patient at bedside. Lives with children and husband and he was at bedside as well. Does not have a need for DME. Able to drive self to and from appointments. Has good family support. Get her medications through Medicaid. Cage-Aid done, see chart. Online counseling information placed on AVS and educational resources given. Denies needs at this time. No further TOC needs.   Final next level of care: Home/Self Care Barriers to Discharge: No Barriers Identified   Patient Goals and CMS Choice Patient states their goals for this hospitalization and ongoing recovery are:: To go home CMS Medicare.gov Compare Post Acute Care list provided to:: Patient Choice offered to / list presented to : Patient  Discharge Placement                       Discharge Plan and Services                                     Social Determinants of Health (SDOH) Interventions     Readmission Risk Interventions No flowsheet data found.

## 2020-09-25 NOTE — TOC CAGE-AID Note (Signed)
Transition of Care [facility]) - CAGE-AID Screening   Patient Details  Name: [name] MRN: [id number] Date of Birth: 1980-08-23  Transition of Care [facility]) CM/SW Contact:    Tom-Johnson, Renea Ee, RN Phone Number: 09/25/2020, 4:21 PM   Clinical Narrative: Spoke with patient at bedside. Endorses drinking and smoking Marijuana occasionally. Denies any addiction. Plans on quitting soon. Educational material given to patient and online resources placed on AVS.    CAGE-AID Screening:    Have You Ever Felt You Ought to Cut Down on Your Drinking or Drug Use?: Yes Have People Annoyed You By [employer] Your Drinking Or Drug Use?: No Have You Felt Bad Or Guilty About Your Drinking Or Drug Use?: Yes Have You Ever Had a Drink or Used Drugs First Thing In The Morning to Steady Your Nerves or to Get Rid of a Hangover?: No CAGE-AID Score: 2     Substance abuse interventions: Patient Counseling, Educational Materials

## 2021-04-05 ENCOUNTER — Encounter (HOSPITAL_COMMUNITY): Payer: Self-pay | Admitting: Emergency Medicine

## 2021-04-05 ENCOUNTER — Inpatient Hospital Stay (HOSPITAL_COMMUNITY)
Admission: EM | Admit: 2021-04-05 | Discharge: 2021-04-07 | DRG: 305 | Disposition: A | Discharge status: Home / Self Care | Payer: Medicaid Other | Attending: Internal Medicine | Admitting: Internal Medicine

## 2021-04-05 ENCOUNTER — Other Ambulatory Visit: Payer: Self-pay

## 2021-04-05 DIAGNOSIS — D631 Anemia in chronic kidney disease: Secondary | ICD-10-CM | POA: Diagnosis present

## 2021-04-05 DIAGNOSIS — Z833 Family history of diabetes mellitus: Secondary | ICD-10-CM

## 2021-04-05 DIAGNOSIS — R000 Tachycardia, unspecified: Secondary | ICD-10-CM | POA: Diagnosis not present

## 2021-04-05 DIAGNOSIS — R112 Nausea with vomiting, unspecified: Secondary | ICD-10-CM | POA: Diagnosis not present

## 2021-04-05 DIAGNOSIS — Z885 Allergy status to narcotic agent status: Secondary | ICD-10-CM

## 2021-04-05 DIAGNOSIS — N185 Chronic kidney disease, stage 5: Secondary | ICD-10-CM | POA: Diagnosis present

## 2021-04-05 DIAGNOSIS — R1110 Vomiting, unspecified: Secondary | ICD-10-CM | POA: Diagnosis present

## 2021-04-05 DIAGNOSIS — E119 Type 2 diabetes mellitus without complications: Secondary | ICD-10-CM

## 2021-04-05 DIAGNOSIS — T465X6A Underdosing of other antihypertensive drugs, initial encounter: Secondary | ICD-10-CM | POA: Diagnosis present

## 2021-04-05 DIAGNOSIS — Z20822 Contact with and (suspected) exposure to covid-19: Secondary | ICD-10-CM | POA: Diagnosis present

## 2021-04-05 DIAGNOSIS — T461X6A Underdosing of calcium-channel blockers, initial encounter: Secondary | ICD-10-CM | POA: Diagnosis present

## 2021-04-05 DIAGNOSIS — Z91013 Allergy to seafood: Secondary | ICD-10-CM

## 2021-04-05 DIAGNOSIS — F12988 Cannabis use, unspecified with other cannabis-induced disorder: Secondary | ICD-10-CM | POA: Diagnosis present

## 2021-04-05 DIAGNOSIS — K3184 Gastroparesis: Secondary | ICD-10-CM | POA: Diagnosis not present

## 2021-04-05 DIAGNOSIS — D638 Anemia in other chronic diseases classified elsewhere: Secondary | ICD-10-CM | POA: Diagnosis present

## 2021-04-05 DIAGNOSIS — E1143 Type 2 diabetes mellitus with diabetic autonomic (poly)neuropathy: Secondary | ICD-10-CM | POA: Diagnosis not present

## 2021-04-05 DIAGNOSIS — I129 Hypertensive chronic kidney disease with stage 1 through stage 4 chronic kidney disease, or unspecified chronic kidney disease: Secondary | ICD-10-CM | POA: Diagnosis present

## 2021-04-05 DIAGNOSIS — Z79899 Other long term (current) drug therapy: Secondary | ICD-10-CM

## 2021-04-05 DIAGNOSIS — I160 Hypertensive urgency: Principal | ICD-10-CM

## 2021-04-05 DIAGNOSIS — Z91128 Patient's intentional underdosing of medication regimen for other reason: Secondary | ICD-10-CM

## 2021-04-05 DIAGNOSIS — E1122 Type 2 diabetes mellitus with diabetic chronic kidney disease: Secondary | ICD-10-CM | POA: Diagnosis present

## 2021-04-05 DIAGNOSIS — E1165 Type 2 diabetes mellitus with hyperglycemia: Principal | ICD-10-CM

## 2021-04-05 DIAGNOSIS — E872 Acidosis, unspecified: Secondary | ICD-10-CM | POA: Diagnosis present

## 2021-04-05 DIAGNOSIS — N184 Chronic kidney disease, stage 4 (severe): Secondary | ICD-10-CM | POA: Diagnosis present

## 2021-04-05 DIAGNOSIS — Z8249 Family history of ischemic heart disease and other diseases of the circulatory system: Secondary | ICD-10-CM

## 2021-04-05 LAB — CBC WITH DIFFERENTIAL/PLATELET
Abs Immature Granulocytes: 0.02 10*3/uL (ref 0.00–0.07)
Basophils Absolute: 0 10*3/uL (ref 0.0–0.1)
Basophils Relative: 0 %
Eosinophils Absolute: 0 10*3/uL (ref 0.0–0.5)
Eosinophils Relative: 0 %
HCT: 26.3 % — ABNORMAL LOW (ref 36.0–46.0)
Hemoglobin: 8.1 g/dL — ABNORMAL LOW (ref 12.0–15.0)
Immature Granulocytes: 0 %
Lymphocytes Relative: 14 %
Lymphs Abs: 1.2 10*3/uL (ref 0.7–4.0)
MCH: 24 pg — ABNORMAL LOW (ref 26.0–34.0)
MCHC: 30.8 g/dL (ref 30.0–36.0)
MCV: 78 fL — ABNORMAL LOW (ref 80.0–100.0)
Monocytes Absolute: 0.4 10*3/uL (ref 0.1–1.0)
Monocytes Relative: 4 %
Neutro Abs: 7.4 10*3/uL (ref 1.7–7.7)
Neutrophils Relative %: 82 %
Platelets: 299 10*3/uL (ref 150–400)
RBC: 3.37 MIL/uL — ABNORMAL LOW (ref 3.87–5.11)
RDW: 16 % — ABNORMAL HIGH (ref 11.5–15.5)
WBC: 9 10*3/uL (ref 4.0–10.5)
nRBC: 0 % (ref 0.0–0.2)

## 2021-04-05 LAB — PREGNANCY, URINE: Preg Test, Ur: NEGATIVE

## 2021-04-05 LAB — URINALYSIS, ROUTINE W REFLEX MICROSCOPIC
Bacteria, UA: NONE SEEN
Bilirubin Urine: NEGATIVE
Glucose, UA: 50 mg/dL — AB
Ketones, ur: 5 mg/dL — AB
Leukocytes,Ua: NEGATIVE
Nitrite: NEGATIVE
Protein, ur: >=300 mg/dL — AB
Specific Gravity, Urine: 1.011 (ref 1.005–1.030)
pH: 7 (ref 5.0–8.0)

## 2021-04-05 LAB — COMPREHENSIVE METABOLIC PANEL
ALT: 11 U/L (ref 0–44)
AST: 16 U/L (ref 15–41)
Albumin: 3.2 g/dL — ABNORMAL LOW (ref 3.5–5.0)
Alkaline Phosphatase: 72 U/L (ref 38–126)
Anion gap: 12 (ref 5–15)
BUN: 30 mg/dL — ABNORMAL HIGH (ref 6–20)
CO2: 15 mmol/L — ABNORMAL LOW (ref 22–32)
Calcium: 8.4 mg/dL — ABNORMAL LOW (ref 8.9–10.3)
Chloride: 109 mmol/L (ref 98–111)
Creatinine, Ser: 3.32 mg/dL — ABNORMAL HIGH (ref 0.44–1.00)
GFR, Estimated: 17 mL/min — ABNORMAL LOW (ref >60)
Glucose, Bld: 162 mg/dL — ABNORMAL HIGH (ref 70–99)
Potassium: 3.8 mmol/L (ref 3.5–5.1)
Sodium: 136 mmol/L (ref 135–145)
Total Bilirubin: 0.6 mg/dL (ref 0.3–1.2)
Total Protein: 7.3 g/dL (ref 6.5–8.1)

## 2021-04-05 LAB — LIPASE, BLOOD: Lipase: 51 U/L (ref 11–51)

## 2021-04-05 LAB — RESP PANEL BY RT-PCR (FLU A&B, COVID) ARPGX2
Influenza A by PCR: NEGATIVE
Influenza B by PCR: NEGATIVE
SARS Coronavirus 2 by RT PCR: NEGATIVE

## 2021-04-05 LAB — I-STAT BETA HCG BLOOD, ED (MC, WL, AP ONLY): I-stat hCG, quantitative: 10.2 m[IU]/mL — ABNORMAL HIGH (ref <5)

## 2021-04-05 LAB — CBG MONITORING, ED
Glucose-Capillary: 170 mg/dL — ABNORMAL HIGH (ref 70–99)
Glucose-Capillary: 178 mg/dL — ABNORMAL HIGH (ref 70–99)

## 2021-04-05 MED ORDER — INSULIN ASPART 100 UNIT/ML IJ SOLN
0.0000 [IU] | INTRAMUSCULAR | Status: DC
  Administered 2021-04-05 – 2021-04-06 (×4): 2 [IU] via SUBCUTANEOUS
  Administered 2021-04-07 (×2): 1 [IU] via SUBCUTANEOUS
  Administered 2021-04-07: 2 [IU] via SUBCUTANEOUS

## 2021-04-05 MED ORDER — AMLODIPINE BESYLATE 10 MG PO TABS
10.0000 mg | ORAL_TABLET | Freq: Every day | ORAL | Status: DC
  Administered 2021-04-06 – 2021-04-07 (×2): 10 mg via ORAL
  Filled 2021-04-05 (×2): qty 1

## 2021-04-05 MED ORDER — LABETALOL HCL 5 MG/ML IV SOLN
10.0000 mg | INTRAVENOUS | Status: DC | PRN
  Administered 2021-04-05: 10 mg via INTRAVENOUS
  Filled 2021-04-05: qty 4

## 2021-04-05 MED ORDER — HYDRALAZINE HCL 25 MG PO TABS
25.0000 mg | ORAL_TABLET | Freq: Two times a day (BID) | ORAL | Status: DC
  Administered 2021-04-06: 25 mg via ORAL
  Filled 2021-04-05: qty 1

## 2021-04-05 MED ORDER — METOPROLOL SUCCINATE ER 25 MG PO TB24
25.0000 mg | ORAL_TABLET | Freq: Every day | ORAL | Status: DC
  Administered 2021-04-06 – 2021-04-07 (×2): 25 mg via ORAL
  Filled 2021-04-05 (×2): qty 1

## 2021-04-05 MED ORDER — METOCLOPRAMIDE HCL 5 MG/ML IJ SOLN
10.0000 mg | Freq: Three times a day (TID) | INTRAMUSCULAR | Status: DC
Start: 2021-04-06 — End: 2021-04-07
  Administered 2021-04-06 – 2021-04-07 (×4): 10 mg via INTRAVENOUS
  Filled 2021-04-05 (×4): qty 2

## 2021-04-05 MED ORDER — SENNOSIDES-DOCUSATE SODIUM 8.6-50 MG PO TABS: 1.0000 | ORAL_TABLET | Freq: Every evening | ORAL | Status: DC | PRN

## 2021-04-05 MED ORDER — SODIUM CHLORIDE 0.9% FLUSH
3.0000 mL | Freq: Two times a day (BID) | INTRAVENOUS | Status: DC
  Administered 2021-04-06 – 2021-04-07 (×2): 3 mL via INTRAVENOUS

## 2021-04-05 MED ORDER — ACETAMINOPHEN 650 MG RE SUPP: 650.0000 mg | Freq: Four times a day (QID) | RECTAL | Status: DC | PRN

## 2021-04-05 MED ORDER — LACTATED RINGERS IV SOLN: INTRAVENOUS | Status: AC

## 2021-04-05 MED ORDER — ONDANSETRON HCL 4 MG/2ML IJ SOLN
4.0000 mg | Freq: Four times a day (QID) | INTRAMUSCULAR | Status: DC | PRN
  Administered 2021-04-05: 4 mg via INTRAVENOUS
  Filled 2021-04-05: qty 2

## 2021-04-05 MED ORDER — ACETAMINOPHEN 325 MG PO TABS: 650.0000 mg | ORAL_TABLET | Freq: Four times a day (QID) | ORAL | Status: DC | PRN

## 2021-04-05 MED ORDER — ONDANSETRON HCL 4 MG PO TABS: 4.0000 mg | ORAL_TABLET | Freq: Four times a day (QID) | ORAL | Status: DC | PRN

## 2021-04-05 MED ORDER — DROPERIDOL 2.5 MG/ML IJ SOLN
2.5000 mg | Freq: Once | INTRAMUSCULAR | Status: AC
  Administered 2021-04-05: 2.5 mg via INTRAVENOUS
  Filled 2021-04-05: qty 2

## 2021-04-05 MED ORDER — LORAZEPAM 2 MG/ML IJ SOLN
1.0000 mg | Freq: Once | INTRAMUSCULAR | Status: AC
  Administered 2021-04-05: 1 mg via INTRAVENOUS
  Filled 2021-04-05: qty 1

## 2021-04-05 MED ORDER — SODIUM CHLORIDE 0.9 % IV BOLUS
1000.0000 mL | Freq: Once | INTRAVENOUS | Status: AC
  Administered 2021-04-05: 1000 mL via INTRAVENOUS

## 2021-04-05 MED ORDER — HEPARIN SODIUM (PORCINE) 5000 UNIT/ML IJ SOLN
5000.0000 [IU] | Freq: Three times a day (TID) | INTRAMUSCULAR | Status: DC
  Administered 2021-04-05 – 2021-04-07 (×5): 5000 [IU] via SUBCUTANEOUS
  Filled 2021-04-05 (×5): qty 1

## 2021-04-05 MED ORDER — METOCLOPRAMIDE HCL 5 MG/ML IJ SOLN
10.0000 mg | Freq: Once | INTRAMUSCULAR | Status: AC
  Administered 2021-04-05: 10 mg via INTRAVENOUS
  Filled 2021-04-05: qty 2

## 2021-04-05 NOTE — ED Notes (Signed)
Pt c/o abdominal pain "all over" and requesting pain medication. This RN explained to pt that ED provider is currently unavailable and with another pt in [place], and that this RN will make him aware of pt's medication request once he is available.  ?

## 2021-04-05 NOTE — Assessment & Plan Note (Addendum)
There is a possibility of gastroparesis as well.  Decreased hemoglobin A1c of 6.4, six months back.  On sliding sliding scale insulin at home. ?

## 2021-04-05 NOTE — Assessment & Plan Note (Addendum)
Latest hemoglobin of 8.6.  Likely anemia of chronic kidney disease. ?

## 2021-04-05 NOTE — ED Provider Triage Note (Signed)
Emergency Medicine Provider Triage Evaluation Note ? ?Alphonsa Gin , a 41 y.o. female  was evaluated in triage.  Pt complains of abdominal pain, nausea, vomiting.  States that same began when she woke up this morning.  Endorses associated diffuse abdominal pain that does not radiate.  History of hyperemesis cannabinoid, states that she has significantly reduced the amount of marijuana that she smokes, however she still smokes daily with last used yesterday.  Also endorses that she went to a crab boil yesterday and has some concern for food poisoning.  No known sick contacts.  No fevers or chills. ? ?Review of Systems  ?Positive: Abdominal pain, nausea, vomiting ?Negative: Fevers, chills, diarrhea ? ?Physical Exam  ?BP (!) 215/121 (BP Location: Right Arm)   Pulse (!) 112   Temp 98.1 ?F (36.7 ?C) (Oral)   Resp 19   SpO2 100%  ?Gen:   Awake, no distress   ?Resp:  Normal effort ?MSK:   Moves extremities without difficulty  ?Other:  Actively vomiting in triage ? ?Medical Decision Making  ?Medically screening exam initiated at 1:44 PM.  Appropriate orders placed.  Haniah Penny was informed that the remainder of the evaluation will be completed by another provider, this initial triage assessment does not replace that evaluation, and the importance of remaining in the ED until their evaluation is complete. ? ? ?  ?Bud Face, PA-C ?04/05/21 1346 ? ?

## 2021-04-05 NOTE — ED Triage Notes (Signed)
Patient here with complaint of emesis and abdominal pain, patient states she was previously told her symptoms were caused by smoking too much weed. Patient states she still smokes cannabis but less than she used to. Patient is alert and oriented at this time. ?

## 2021-04-05 NOTE — Assessment & Plan Note (Addendum)
Likely secondary to cyclical vomiting/cannabinoid hyperemesis syndrome.  Required Zofran and Reglan and even Compazine during hospitalization.  At the time patient is able to tolerate clears and has been advanced to full liquids.  Patient was advised against smoking cannabis and advised frequent small meals.  Received IV fluid hydration during hospitalization.  Advised to remain on soft bland diet for the next few days. ?

## 2021-04-05 NOTE — ED Provider Notes (Signed)
?[facility] ?Provider Note ? ? ?CSN: [id number] ?Arrival date & time: 04/05/21  1317 ? ?  ? ?History ? ?Chief Complaint  ?Patient presents with  ? Abdominal Pain  ? ? ?[name] is a 41 y.o. female with history of hypertension, cyclical vomiting, presenting to ED with vomiting and abdominal pain.  Patient cannot provide significant history, as she is actively vomiting and dry heaving on my exam.  She reports that she had "bad seafood" yesterday.  She denies any diarrhea.  She says she is having upset stomach and vomiting and draining since then.  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  It is unclear if she is still using cannabis at this time.  She is also had some mildly falsely elevated hCG levels in the past with her point-of-care testing, likely test false positive and ? ?HPI ? ?  ? ?Home Medications ?Prior to Admission medications   ?Medication Sig Start Date End Date Taking? Authorizing Provider  ?amLODipine (NORVASC) 10 MG tablet Take 1 tablet (10 mg total) by mouth daily. ?Patient not taking: Reported on 04/05/2021 09/25/20   Velna Ochs, MD  ?ferrous sulfate 325 (65 FE) MG tablet Take 1 tablet (325 mg total) by mouth 2 (two) times daily with a meal. ?Patient not taking: Reported on 09/23/2020 02/08/20   Domenic Polite, MD  ?hydrALAZINE (APRESOLINE) 25 MG tablet Take 1 tablet (25 mg total) by mouth 2 (two) times daily. ?Patient not taking: Reported on 04/05/2021 09/25/20   Velna Ochs, MD  ?insulin aspart (NOVOLOG) 100 UNIT/ML injection Before each meal 3 times a day , 140-199 - 2 units, 200-250 - 4 units, 251-299 - 6 units,  300-349 - 8 units,  350 or above 10 units. ?Insulin PEN if approved, provide syringes and needles if needed. Can switch to any other cheaper alternative. ?Patient not taking: Reported on 02/05/2020 08/25/18   Thurnell Lose, MD   ?Insulin Syringe-Needle U-100 25G X 1" 1 ML MISC For 4 times a day insulin SQ, 1 month supply. Diagnosis E11.65 ?Patient not taking: Reported on 09/23/2020 08/25/18   Thurnell Lose, MD  ?metoprolol succinate (TOPROL-XL) 25 MG 24 hr tablet Take 1 tablet (25 mg total) by mouth daily. 09/25/20 10/25/20  Velna Ochs, MD  ?ondansetron (ZOFRAN ODT) 4 MG disintegrating tablet Take 1 tablet (4 mg total) by mouth every 8 (eight) hours as needed for nausea or vomiting. ?Patient not taking: Reported on 04/05/2021 09/25/20   Velna Ochs, MD  ?   ? ?Allergies    ?Shellfish allergy and Morphine and related   ? ?Review of Systems   ?Review of Systems ? ?Physical Exam ?Updated Vital Signs ?BP (!) 205/98   Pulse (!) 108   Temp 98.7 ?F (37.1 ?C)   Resp 14   SpO2 100%  ?Physical Exam ?Constitutional:   ?   General: She is not in acute distress. ?   Comments: Dry heaving loudly   ?HENT:  ?   Head: Normocephalic and atraumatic.  ?Eyes:  ?   Conjunctiva/sclera: Conjunctivae normal.  ?   Pupils: Pupils are equal, round, and reactive to light.  ?Cardiovascular:  ?   Rate and Rhythm: Normal rate and regular rhythm.  ?Pulmonary:  ?   Effort: Pulmonary effort is normal. No respiratory distress.  ?Abdominal:  ?   General: There is no distension.  ?   Tenderness: There  is no abdominal tenderness.  ?Skin: ?   General: Skin is warm and dry.  ?Neurological:  ?   General: No focal deficit present.  ?   Mental Status: She is alert. Mental status is at baseline.  ?Psychiatric:     ?   Mood and Affect: Mood normal.     ?   Behavior: Behavior normal.  ? ? ?ED Results / Procedures / Treatments   ?Labs ?(all labs ordered are listed, but only abnormal results are displayed) ?Labs Reviewed  ?COMPREHENSIVE METABOLIC PANEL - Abnormal; Notable for the following components:  ?    Result Value  ? CO2 15 (*)   ? Glucose, Bld 162 (*)   ? BUN 30 (*)   ? Creatinine, Ser 3.32 (*)   ? Calcium 8.4 (*)   ? Albumin 3.2 (*)   ? GFR, Estimated 17 (*)   ? All  other components within normal limits  ?CBC WITH DIFFERENTIAL/PLATELET - Abnormal; Notable for the following components:  ? RBC 3.37 (*)   ? Hemoglobin 8.1 (*)   ? HCT 26.3 (*)   ? MCV 78.0 (*)   ? MCH 24.0 (*)   ? RDW 16.0 (*)   ? All other components within normal limits  ?URINALYSIS, ROUTINE W REFLEX MICROSCOPIC - Abnormal; Notable for the following components:  ? Color, Urine STRAW (*)   ? Glucose, UA 50 (*)   ? Hgb urine dipstick SMALL (*)   ? Ketones, ur 5 (*)   ? Protein, ur >=300 (*)   ? All other components within normal limits  ?I-STAT BETA HCG BLOOD, ED ([facility], [facility], [facility] ONLY) - Abnormal; Notable for the following components:  ? I-stat hCG, quantitative 10.2 (*)   ? All other components within normal limits  ?RESP PANEL BY RT-PCR (FLU A&B, COVID) ARPGX2  ?LIPASE, BLOOD  ?PREGNANCY, URINE  ?RAPID URINE DRUG SCREEN, HOSP PERFORMED  ? ? ?EKG ?None ? ?Radiology ?No results found. ? ?Procedures ?Procedures  ? ? ?Medications Ordered in ED ?Medications  ?sodium chloride 0.9 % bolus 1,000 mL (0 mLs Intravenous Stopped 04/05/21 1842)  ?droperidol (INAPSINE) 2.5 MG/ML injection 2.5 mg (2.5 mg Intravenous Given 04/05/21 1659)  ?LORazepam (ATIVAN) injection 1 mg (1 mg Intravenous Given 04/05/21 1658)  ?metoCLOPramide (REGLAN) injection 10 mg (10 mg Intravenous Given 04/05/21 1949)  ? ? ?ED Course/ Medical Decision Making/ A&P ?Clinical Course as of 04/05/21 2100  ?Sun Apr 05, 2021  ?1712 USIV placed, giving medications now [MT]  ?1957 2nd round medication for nausea, abd pain [MT]  ?2025 Patient is still not able to tolerate fluids, having persistent dry heaving.  She has gone through multiple rounds of antiemetics.  At this point we will admit her to the hospital, she already has chronic kidney disease and I do not want her to dehydrate further.  Patient is agreeable to stay [MT]  ?2059 Admitted to Dr Posey Pronto hospitalist [MT]  ?  ?Clinical Course User Index ?[MT] Wyvonnia Dusky, MD  ? ?                        ?Medical  Decision Making ?Amount and/or Complexity of Data Reviewed ?Labs: ordered. ?ECG/medicine tests: ordered. ? ?Risk ?Prescription drug management. ?Decision regarding hospitalization. ? ? ?This patient presents to the ED with concern for abdominal pain, vomiting. This involves an extensive number of treatment options, and is a complaint that carries with it a high risk of complications and  morbidity.  The differential diagnosis includes gastroparesis versus cyclical vomiting versus other ? ?Co-morbidities that complicate the patient evaluation: Recurrent use of cannabis raises risk for cyclical vomiting ? ? ?I ordered and personally interpreted labs.  The pertinent results include: Creatinine near baseline for chronic kidney disease at 3.3 today, similar to 6 months ago.  Lipase and LFTs within normal limits.  No leukocytosis.  Chronic anemia near baseline ? ?Very low clinical suspicion at this time for acute pancreatitis, biliary disease, other intra-abdominal infection or surgical emergency.  I do not believe emergent CT imaging is warranted at this time. ? ?The patient was maintained on a cardiac monitor.  I personally viewed and interpreted the cardiac monitored which showed an underlying rhythm of: Sinus tachycardia ? ?Per my interpretation the patient's ECG shows sinus rhythm with Qtc 450. ? ?I ordered medication including IV droperidol, IV Ativan, IV fluids for suspected cyclical vomiting ?I have reviewed the patients home medicines and have made adjustments as needed ? ? ?After the interventions noted above, I reevaluated the patient and found that they have: stayed the same ? ?Patient still not able to tolerate fluids ? ? ?Dispostion: ? ?After consideration of the diagnostic results and the patients response to treatment, I feel that the patient would benefit from medical admission ? ? ? ? ? ? ? ? ?Final Clinical Impression(s) / ED Diagnoses ?Final diagnoses:  ?Gastroparesis  ? ? ?Rx / DC Orders ?ED Discharge  Orders   ? ? None  ? ?  ? ? ?  ?Wyvonnia Dusky, MD ?04/05/21 2100 ? ?

## 2021-04-05 NOTE — Hospital Course (Addendum)
[name] is a 41 y.o. female with past medical medical history significant for type 2 diabetes, hypertension, CKD stage IV, asthma, anemia of chronic disease, cyclical vomiting, depression/anxiety, history of marijuana and cocaine use presented to the hospital with intractable nausea and vomiting.  In the ED, patient was noted to have elevated blood pressure, tachycardia and elevated creatinine at 3.3.  UA was negative.  Patient was given 1 L of normal saline, Reglan Ativan and was admitted hospital for intractable nausea vomiting with elevated creatinine. ?

## 2021-04-05 NOTE — Assessment & Plan Note (Addendum)
Latest creatinine of 3.6.  Patient does have CKD stage IV disease.  Will need better blood pressure control as outpatient and regular follow-up. ?

## 2021-04-05 NOTE — Assessment & Plan Note (Addendum)
Likely secondary to intractable nausea vomiting and noncompliance to medications for the last 1 month.  Patient had ran out of antihypertensives for last 1 month.  All the medications have been prescribed with refills.  She was encouraged to follow-up with her primary care physician. ?

## 2021-04-05 NOTE — ED Notes (Signed)
Pt vomiting now  ?

## 2021-04-05 NOTE — H&P (Signed)
?History and Physical  ? ? ?[name] OVZ:[id number] DOB: Nov 04, 1980 DOA: 04/05/2021 ? ?PCP: Patient, No Pcp Per (Inactive)  ?Patient coming from: Home ? ?I have personally briefly reviewed patient's old medical records in [place] ? ?Chief Complaint: Nausea and vomiting ? ?HPI: ?[name] is a 41 y.o. female with medical history significant for type 2 diabetes, hypertension, CKD stage IV, asthma, anemia of chronic disease, cyclical vomiting, depression/anxiety, history of marijuana and cocaine use who presented to the ED for evaluation of nausea and vomiting. ? ?Patient reports developing acute onset intractable nausea and vomiting earlier today (3/19).  She has not been able to maintain any adequate oral intake or keep her medications down.  She has been having some generalized abdominal pain.  She denies any dysuria or diarrhea.  She does admit to ongoing marijuana use, last use day prior to admission.  She denies any tobacco, cocaine, or IV drug use. ? ?ED Course  Labs/Imaging on admission: I have personally reviewed following labs and imaging studies. ? ?Initial vitals showed BP 215/121, pulse 112, RR 19, temp 98.1 ?F, SPO2 100% on room air. ? ?Labs showed WBC 9.0, hemoglobin 8.1, platelets 299,000, sodium 136, potassium 3.8, bicarb 15, BUN 30, creatinine 3.32, serum glucose 162, lipase 51.  I-STAT beta-hCG 10.2 however urine pregnancy negative. ? ?Urinalysis negative for UTI.  UDS pending.  Respiratory panel in process. ? ?Patient was given 1 L normal saline, IV Reglan 10 mg, 1 mg IV Ativan, 2.5 mg IV droperidol.  The hospitalist service was consulted to admit for further evaluation and management for intractable nausea and vomiting. ? ?Review of Systems: All systems reviewed and are negative except as documented in history of present illness above. ? ? ?Past Medical History:  ?Diagnosis Date  ? Anxiety   ? Asthma   ? Depression   ? Diabetes mellitus   ? Hyperemesis arising during pregnancy   ?  Hypertension   ? Mental disorder   ? ? ?Past Surgical History:  ?Procedure Laterality Date  ? ESOPHAGOGASTRODUODENOSCOPY (EGD) WITH PROPOFOL N/A 08/07/2019  ? Procedure: ESOPHAGOGASTRODUODENOSCOPY (EGD) WITH PROPOFOL;  Surgeon: Irene Shipper, MD;  Location: [facility];  Service: Endoscopy;  Laterality: N/A;  ? NO PAST SURGERIES    ? TUBAL LIGATION  06/20/2011  ? Procedure: POST PARTUM TUBAL LIGATION;  Surgeon: Mora Bellman, MD;  Location: [facility];  Service: Gynecology;  Laterality: Bilateral;  Bilateral post partum tubal ligation  ? ? ?Social History: ? reports that she has never smoked. She has never used smokeless tobacco. She reports that she does not drink alcohol and does not use drugs. ? ?Allergies  ?Allergen Reactions  ? Shellfish Allergy Anaphylaxis and Swelling  ? Morphine And Related Nausea And Vomiting  ? ? ?Family History  ?Problem Relation Age of Onset  ? Heart disease Mother   ?     CHF  ? Diabetes Sister   ? Anesthesia problems Neg Hx   ? ? ? ?Prior to Admission medications   ?Medication Sig Start Date End Date Taking? Authorizing Provider  ?amLODipine (NORVASC) 10 MG tablet Take 1 tablet (10 mg total) by mouth daily. ?Patient not taking: Reported on 04/05/2021 09/25/20   Velna Ochs, MD  ?ferrous sulfate 325 (65 FE) MG tablet Take 1 tablet (325 mg total) by mouth 2 (two) times daily with a meal. ?Patient not taking: Reported on 09/23/2020 02/08/20   Domenic Polite, MD  ?hydrALAZINE (APRESOLINE) 25 MG tablet Take 1 tablet (25 mg  total) by mouth 2 (two) times daily. ?Patient not taking: Reported on 04/05/2021 09/25/20   Velna Ochs, MD  ?insulin aspart (NOVOLOG) 100 UNIT/ML injection Before each meal 3 times a day , 140-199 - 2 units, 200-250 - 4 units, 251-299 - 6 units,  300-349 - 8 units,  350 or above 10 units. ?Insulin PEN if approved, provide syringes and needles if needed. Can switch to any other cheaper alternative. ?Patient not taking: Reported on 02/05/2020 08/25/18   Thurnell Lose, MD   ?Insulin Syringe-Needle U-100 25G X 1" 1 ML MISC For 4 times a day insulin SQ, 1 month supply. Diagnosis E11.65 ?Patient not taking: Reported on 09/23/2020 08/25/18   Thurnell Lose, MD  ?metoprolol succinate (TOPROL-XL) 25 MG 24 hr tablet Take 1 tablet (25 mg total) by mouth daily. 09/25/20 10/25/20  Velna Ochs, MD  ?ondansetron (ZOFRAN ODT) 4 MG disintegrating tablet Take 1 tablet (4 mg total) by mouth every 8 (eight) hours as needed for nausea or vomiting. ?Patient not taking: Reported on 04/05/2021 09/25/20   Velna Ochs, MD  ? ? ?Physical Exam: ?Vitals:  ? 04/05/21 1715 04/05/21 1730 04/05/21 1800 04/05/21 2053  ?BP: (!) 188/95 (!) 155/88 (!) 168/100 (!) 205/98  ?Pulse: (!) 104 92 90 (!) 108  ?Resp: 14 (!) 22  14  ?Temp:    98.7 ?F (37.1 ?C)  ?TempSrc:      ?SpO2: 100% 100% 100% 100%  ? ?Constitutional: Resting in bed in the right lateral decubitus position, NAD, calm, comfortable ?Eyes: PERRL, lids and conjunctivae normal ?ENMT: Mucous membranes are dry. Posterior pharynx clear of any exudate or lesions.poor dentition.  ?Neck: normal, supple, no masses. ?Respiratory: clear to auscultation bilaterally, no wheezing, no crackles. Normal respiratory effort. No accessory muscle use.  ?Cardiovascular: Tachycardic, no murmurs / rubs / gallops. No extremity edema. 2+ pedal pulses. ?Abdomen: Mild generalized tenderness, no masses palpated. No hepatosplenomegaly. Bowel sounds positive.  ?Musculoskeletal: no clubbing / cyanosis. No joint deformity upper and lower extremities. Good ROM, no contractures. Normal muscle tone.  ?Skin: no rashes, lesions, ulcers. No induration ?Neurologic: CN 2-12 grossly intact. Sensation intact. Strength 5/5 in all 4.  ?Psychiatric: Alert and oriented x 3. Normal mood.  ? ?EKG: Personally reviewed. Sinus tachycardia, rate 107, no acute ischemic changes.  Rate is faster when compared to prior. ? ?Assessment/Plan ?Principal Problem: ?  Intractable nausea and vomiting ?Active Problems: ?   Hypertensive urgency ?  CKD (chronic kidney disease), stage IV ([place]) ?  Type 2 diabetes mellitus ([place]) ?  Anemia of chronic disease ?  ?[name] is a 41 y.o. female with medical history significant for type 2 diabetes, hypertension, CKD stage IV, asthma, anemia of chronic disease, cyclical vomiting, depression/anxiety, history of marijuana and cocaine use who is admitted with intractable nausea and vomiting. ? ?Assessment and Plan: ?* Intractable nausea and vomiting ?Likely cyclical vomiting/cannabinoid hyperemesis syndrome given ongoing marijuana use.  Still with ongoing nausea and inability maintain adequate oral intake. ?-Start on scheduled IV Reglan every 8 hours ?-IV Zofran as needed ?-Continue IV fluid hydration while n.p.o. ?-Advance diet as tolerated ? ?Hypertensive urgency ?In setting of ongoing nausea/vomiting with inability to keep down antihypertensives. ?-IV labetalol as needed ?-Restart home amlodipine 10 mg daily, hydralazine 25 mg twice daily, Toprol-XL 25 mg daily as tolerated ? ?CKD (chronic kidney disease), stage IV ([place]) ?Renal function at baseline with creatinine 3.32.  Continue to monitor. ? ?Type 2 diabetes mellitus ([place]) ?Place on sensitive SSI every 4  hours while NPO. ? ?Anemia of chronic disease ?Hemoglobin stable at 8.1.  Denies any obvious bleeding.  Continue to monitor. ? ?DVT prophylaxis: heparin injection 5,000 Units Start: 04/05/21 2200 ?Code Status: Full code, confirmed on admission ?Family Communication: Discussed with patient, she has discussed with family ?Disposition Plan: From home and likely discharge to home pending clinical progress ?Consults called: None ?Severity of Illness: ?The appropriate patient status for this patient is OBSERVATION. Observation status is judged to be reasonable and necessary in order to provide the required intensity of service to ensure the patient's safety. The patient's presenting symptoms, physical exam findings, and initial radiographic and  laboratory data in the context of their medical condition is felt to place them at decreased risk for further clinical deterioration. Furthermore, it is anticipated that the patient will be medically stab

## 2021-04-06 DIAGNOSIS — R1110 Vomiting, unspecified: Secondary | ICD-10-CM | POA: Diagnosis present

## 2021-04-06 DIAGNOSIS — Z885 Allergy status to narcotic agent status: Secondary | ICD-10-CM | POA: Diagnosis not present

## 2021-04-06 DIAGNOSIS — Z91013 Allergy to seafood: Secondary | ICD-10-CM | POA: Diagnosis not present

## 2021-04-06 DIAGNOSIS — E1122 Type 2 diabetes mellitus with diabetic chronic kidney disease: Secondary | ICD-10-CM | POA: Diagnosis present

## 2021-04-06 DIAGNOSIS — Z20822 Contact with and (suspected) exposure to covid-19: Secondary | ICD-10-CM | POA: Diagnosis present

## 2021-04-06 DIAGNOSIS — R000 Tachycardia, unspecified: Secondary | ICD-10-CM | POA: Diagnosis not present

## 2021-04-06 DIAGNOSIS — I160 Hypertensive urgency: Secondary | ICD-10-CM

## 2021-04-06 DIAGNOSIS — E1143 Type 2 diabetes mellitus with diabetic autonomic (poly)neuropathy: Secondary | ICD-10-CM | POA: Diagnosis not present

## 2021-04-06 DIAGNOSIS — T465X6A Underdosing of other antihypertensive drugs, initial encounter: Secondary | ICD-10-CM | POA: Diagnosis present

## 2021-04-06 DIAGNOSIS — E1165 Type 2 diabetes mellitus with hyperglycemia: Secondary | ICD-10-CM

## 2021-04-06 DIAGNOSIS — E872 Acidosis, unspecified: Secondary | ICD-10-CM | POA: Diagnosis present

## 2021-04-06 DIAGNOSIS — T461X6A Underdosing of calcium-channel blockers, initial encounter: Secondary | ICD-10-CM | POA: Diagnosis present

## 2021-04-06 DIAGNOSIS — R112 Nausea with vomiting, unspecified: Secondary | ICD-10-CM | POA: Diagnosis present

## 2021-04-06 DIAGNOSIS — Z794 Long term (current) use of insulin: Secondary | ICD-10-CM

## 2021-04-06 DIAGNOSIS — Z91128 Patient's intentional underdosing of medication regimen for other reason: Secondary | ICD-10-CM | POA: Diagnosis not present

## 2021-04-06 DIAGNOSIS — Z833 Family history of diabetes mellitus: Secondary | ICD-10-CM | POA: Diagnosis not present

## 2021-04-06 DIAGNOSIS — D631 Anemia in chronic kidney disease: Secondary | ICD-10-CM | POA: Diagnosis present

## 2021-04-06 DIAGNOSIS — D638 Anemia in other chronic diseases classified elsewhere: Secondary | ICD-10-CM

## 2021-04-06 DIAGNOSIS — F12988 Cannabis use, unspecified with other cannabis-induced disorder: Secondary | ICD-10-CM | POA: Diagnosis present

## 2021-04-06 DIAGNOSIS — N184 Chronic kidney disease, stage 4 (severe): Secondary | ICD-10-CM

## 2021-04-06 DIAGNOSIS — Z79899 Other long term (current) drug therapy: Secondary | ICD-10-CM | POA: Diagnosis not present

## 2021-04-06 DIAGNOSIS — I129 Hypertensive chronic kidney disease with stage 1 through stage 4 chronic kidney disease, or unspecified chronic kidney disease: Secondary | ICD-10-CM | POA: Diagnosis present

## 2021-04-06 DIAGNOSIS — Z8249 Family history of ischemic heart disease and other diseases of the circulatory system: Secondary | ICD-10-CM | POA: Diagnosis not present

## 2021-04-06 DIAGNOSIS — K3184 Gastroparesis: Secondary | ICD-10-CM | POA: Diagnosis not present

## 2021-04-06 LAB — GLUCOSE, CAPILLARY
Glucose-Capillary: 112 mg/dL — ABNORMAL HIGH (ref 70–99)
Glucose-Capillary: 131 mg/dL — ABNORMAL HIGH (ref 70–99)
Glucose-Capillary: 135 mg/dL — ABNORMAL HIGH (ref 70–99)
Glucose-Capillary: 151 mg/dL — ABNORMAL HIGH (ref 70–99)
Glucose-Capillary: 157 mg/dL — ABNORMAL HIGH (ref 70–99)
Glucose-Capillary: 83 mg/dL (ref 70–99)

## 2021-04-06 LAB — BASIC METABOLIC PANEL
Anion gap: 12 (ref 5–15)
BUN: 29 mg/dL — ABNORMAL HIGH (ref 6–20)
CO2: 20 mmol/L — ABNORMAL LOW (ref 22–32)
Calcium: 8.7 mg/dL — ABNORMAL LOW (ref 8.9–10.3)
Chloride: 110 mmol/L (ref 98–111)
Creatinine, Ser: 3.4 mg/dL — ABNORMAL HIGH (ref 0.44–1.00)
GFR, Estimated: 17 mL/min — ABNORMAL LOW (ref >60)
Glucose, Bld: 105 mg/dL — ABNORMAL HIGH (ref 70–99)
Potassium: 3.8 mmol/L (ref 3.5–5.1)
Sodium: 142 mmol/L (ref 135–145)

## 2021-04-06 LAB — CBC
HCT: 28.6 % — ABNORMAL LOW (ref 36.0–46.0)
Hemoglobin: 9.2 g/dL — ABNORMAL LOW (ref 12.0–15.0)
MCH: 24.3 pg — ABNORMAL LOW (ref 26.0–34.0)
MCHC: 32.2 g/dL (ref 30.0–36.0)
MCV: 75.7 fL — ABNORMAL LOW (ref 80.0–100.0)
Platelets: 299 10*3/uL (ref 150–400)
RBC: 3.78 MIL/uL — ABNORMAL LOW (ref 3.87–5.11)
RDW: 16.2 % — ABNORMAL HIGH (ref 11.5–15.5)
WBC: 9.4 10*3/uL (ref 4.0–10.5)
nRBC: 0 % (ref 0.0–0.2)

## 2021-04-06 LAB — HEMOGLOBIN A1C
Hgb A1c MFr Bld: 6.5 % — ABNORMAL HIGH (ref 4.8–5.6)
Mean Plasma Glucose: 139.85 mg/dL

## 2021-04-06 LAB — RAPID URINE DRUG SCREEN, HOSP PERFORMED
Amphetamines: NOT DETECTED
Barbiturates: NOT DETECTED
Benzodiazepines: NOT DETECTED
Cocaine: NOT DETECTED
Opiates: NOT DETECTED
Tetrahydrocannabinol: POSITIVE — AB

## 2021-04-06 LAB — MAGNESIUM: Magnesium: 1.7 mg/dL (ref 1.7–2.4)

## 2021-04-06 MED ORDER — ALUM & MAG HYDROXIDE-SIMETH 200-200-20 MG/5ML PO SUSP
30.0000 mL | Freq: Once | ORAL | Status: AC
  Administered 2021-04-06: 30 mL via ORAL
  Filled 2021-04-06: qty 30

## 2021-04-06 MED ORDER — HYDRALAZINE HCL 25 MG PO TABS
25.0000 mg | ORAL_TABLET | Freq: Three times a day (TID) | ORAL | Status: DC
Start: 2021-04-06 — End: 2021-04-07
  Administered 2021-04-06 – 2021-04-07 (×4): 25 mg via ORAL
  Filled 2021-04-06 (×4): qty 1

## 2021-04-06 MED ORDER — LORAZEPAM 2 MG/ML IJ SOLN
0.5000 mg | INTRAMUSCULAR | Status: AC | PRN
  Administered 2021-04-06: 0.5 mg via INTRAVENOUS
  Filled 2021-04-06: qty 1

## 2021-04-06 MED ORDER — HYDRALAZINE HCL 20 MG/ML IJ SOLN: 10.0000 mg | Freq: Four times a day (QID) | INTRAMUSCULAR | Status: DC | PRN

## 2021-04-06 MED ORDER — PROCHLORPERAZINE EDISYLATE 10 MG/2ML IJ SOLN
5.0000 mg | Freq: Four times a day (QID) | INTRAMUSCULAR | Status: DC | PRN
  Administered 2021-04-06: 5 mg via INTRAVENOUS
  Filled 2021-04-06: qty 2

## 2021-04-06 MED ORDER — LACTATED RINGERS IV SOLN: INTRAVENOUS | Status: DC

## 2021-04-06 MED ORDER — ONDANSETRON HCL 4 MG/2ML IJ SOLN
4.0000 mg | Freq: Four times a day (QID) | INTRAMUSCULAR | Status: DC | PRN
Start: 2021-04-06 — End: 2021-04-06

## 2021-04-06 MED ORDER — LORAZEPAM 2 MG/ML IJ SOLN
0.5000 mg | Freq: Once | INTRAMUSCULAR | Status: AC | PRN
  Administered 2021-04-06: 0.5 mg via INTRAVENOUS
  Filled 2021-04-06: qty 1

## 2021-04-06 MED ORDER — LACTATED RINGERS IV SOLN: INTRAVENOUS | Status: AC

## 2021-04-06 MED ORDER — ONDANSETRON HCL 4 MG/2ML IJ SOLN
4.0000 mg | Freq: Four times a day (QID) | INTRAMUSCULAR | Status: DC | PRN
Start: 2021-04-06 — End: 2021-04-07
  Administered 2021-04-06: 4 mg via INTRAVENOUS
  Filled 2021-04-06: qty 2

## 2021-04-06 MED ORDER — LORAZEPAM 2 MG/ML IJ SOLN
0.5000 mg | INTRAMUSCULAR | Status: AC | PRN
  Administered 2021-04-06 – 2021-04-07 (×4): 0.5 mg via INTRAVENOUS
  Filled 2021-04-06 (×4): qty 1

## 2021-04-06 MED ORDER — PANTOPRAZOLE SODIUM 40 MG IV SOLR
40.0000 mg | Freq: Two times a day (BID) | INTRAVENOUS | Status: DC
  Administered 2021-04-06 – 2021-04-07 (×3): 40 mg via INTRAVENOUS
  Filled 2021-04-06 (×3): qty 10

## 2021-04-06 NOTE — Plan of Care (Signed)
POC initiated and progressing. 

## 2021-04-06 NOTE — Progress Notes (Signed)
Patient arrived to the floor, alert and oriented and ambulate to the bed. Stand up weight obtained, patient oriented to staff, room and equipment. Cardiac monitoring initiated, we'll continue to monitor.  ?

## 2021-04-06 NOTE — Progress Notes (Signed)
Pt is vomiting MD paged twice, awaiting for answer.  ?

## 2021-04-06 NOTE — Progress Notes (Signed)
?PROGRESS NOTE ? ? ? ?[name]  UXN:[id number] DOB: Aug 15, 1980 DOA: 04/05/2021 ?PCP: Patient, No Pcp Per (Inactive)  ? ? ?Brief Narrative:  ?[name] is a 41 y.o. female with past medical medical history significant for type 2 diabetes, hypertension, CKD stage IV, asthma, anemia of chronic disease, cyclical vomiting, depression/anxiety, history of marijuana and cocaine use presented to the hospital with intractable nausea and vomiting.  In the ED, patient was noted to have elevated blood pressure, tachycardia and elevated creatinine at 3.3.  UA was negative.  Patient was given 1 L of normal saline, Reglan Ativan and was admitted hospital for intractable nausea vomiting with renal failure.  ? ?  ?Assessment and Plan: ?* Intractable nausea and vomiting ?Likely cyclical vomiting/cannabinoid hyperemesis syndrome.  Patient patient has been refractory to Reglan and Zofran so was started on Compazine.  Continue IV fluids, antiemetics.  Add Ativan for anxiety.  Lipase normal range.  UA negative for infection.  Urine states was negative.  Patient is currently NPO.  We will continue with IV fluids.  Currently getting Ringer's lactate.  Will start clears as tolerated. ? ?Hypertensive urgency ?Likely secondary to intractable nausea vomiting and inability keep the antihypertensives down.  On IV labetalol.  Patient is on amlodipine 10 mg daily, hydralazine 25 mg twice daily, Toprol-XL 25 mg daily at home and will restart when p.o. okay. ? ?CKD (chronic kidney disease), stage IV ([place]) ?Latest creatinine of 3.3.  At that baseline. ? ?Type 2 diabetes mellitus ([place]) ?Continue sliding scale insulin.  Possibility of gastroparesis as well.  Patient A1c of 6.4, 6 months back.  We will add clears as tolerated. ? ?Anemia of chronic disease ?Latest hemoglobin of 8.1.  Likely anemia of chronic kidney disease. ? ? ? DVT prophylaxis: heparin injection 5,000 Units Start: 04/05/21 2200 ? ? ?Code Status:   ?  Code Status: Full  Code ? ?Disposition: Home likely in 1 to 2 days when able to tolerate p.o. ? ?Status is: Observation ? ?The patient will require care spanning > 2 midnights and should be moved to inpatient because: Intractable nausea vomiting, accelerated hypertension, ? ? Family Communication: Spoke with the patient at bedside. ? ?Consultants:  ?None ? ?Procedures:  ?None ? ?Antimicrobials:  ?None ? ?Anti-infectives (From admission, onward)  ? ? None  ? ?  ? ? ? ?Subjective: ?Today, patient was seen and examined at bedside.  Patient still complains of intractable nausea and vomiting and required Compazine this morning stated that Zofran did not help.  Complains of heartburn as well.  She feels thirsty however.  Complains of mild abdominal pain.  Has not had a bowel movement. ? ?Objective: ?Vitals:  ? 04/06/21 0930 04/06/21 1023 04/06/21 1205 04/06/21 1207  ?BP: (!) 206/106 (!) 198/99 (!) 173/101 (!) 173/101  ?Pulse:   (!) 107   ?Resp:  (!) '22 20 11  '$ ?Temp:   98.2 ?F (36.8 ?C)   ?TempSrc:   Oral   ?SpO2:   99%   ?Weight:      ?Height:      ? ? ?Intake/Output Summary (Last 24 hours) at 04/06/2021 1233 ?Last data filed at 04/06/2021 2542 ?Gross per 24 hour  ?Intake 1450 ml  ?Output --  ?Net 1450 ml  ? ?Filed Weights  ? 04/06/21 0111  ?Weight: 83.6 kg  ? ? ?Physical Examination: ? ?General:  Average built, not in obvious distress but appears anxious, ?HENT:   No scleral pallor or icterus noted. Oral mucosa is mildly dry, ?  Chest:  Clear breath sounds.  Diminished breath sounds bilaterally. No crackles or wheezes.  ?CVS: S1 &S2 heard. No murmur.  Regular rate and rhythm. ?Abdomen: Soft, nonspecific tenderness noted nondistended.  Bowel sounds are heard.   ?Extremities: No cyanosis, clubbing or edema.  Peripheral pulses are palpable. ?Psych: Alert, awake and oriented, normal mood ?CNS:  No cranial nerve deficits.  Power equal in all extremities.   ?Skin: Warm and dry.  No rashes noted. ? ?Data Reviewed:  ? ?CBC: ?Recent Labs  ?Lab  04/05/21 ?1345 04/06/21 ?1106  ?WBC 9.0 9.4  ?NEUTROABS 7.4  --   ?HGB 8.1* 9.2*  ?HCT 26.3* 28.6*  ?MCV 78.0* 75.7*  ?PLT 299 299  ? ? ?Basic Metabolic Panel: ?Recent Labs  ?Lab 04/05/21 ?1345 04/06/21 ?1106  ?NA 136 142  ?K 3.8 3.8  ?CL 109 110  ?CO2 15* 20*  ?GLUCOSE 162* 105*  ?BUN 30* 29*  ?CREATININE 3.32* 3.40*  ?CALCIUM 8.4* 8.7*  ?MG  --  1.7  ? ? ?Liver Function Tests: ?Recent Labs  ?Lab 04/05/21 ?1345  ?AST 16  ?ALT 11  ?ALKPHOS 72  ?BILITOT 0.6  ?PROT 7.3  ?ALBUMIN 3.2*  ? ? ? ?Radiology Studies: ?No results found. ? ? ? LOS: 0 days  ? ? ?Flora Lipps, MD ?[facility] ?04/06/2021, 12:33 PM  ? ? ?

## 2021-04-07 DIAGNOSIS — R112 Nausea with vomiting, unspecified: Secondary | ICD-10-CM | POA: Diagnosis not present

## 2021-04-07 DIAGNOSIS — E1165 Type 2 diabetes mellitus with hyperglycemia: Secondary | ICD-10-CM | POA: Diagnosis not present

## 2021-04-07 DIAGNOSIS — N184 Chronic kidney disease, stage 4 (severe): Secondary | ICD-10-CM | POA: Diagnosis not present

## 2021-04-07 DIAGNOSIS — D638 Anemia in other chronic diseases classified elsewhere: Secondary | ICD-10-CM | POA: Diagnosis not present

## 2021-04-07 DIAGNOSIS — R1110 Vomiting, unspecified: Secondary | ICD-10-CM

## 2021-04-07 LAB — CBC
HCT: 26.6 % — ABNORMAL LOW (ref 36.0–46.0)
Hemoglobin: 8.6 g/dL — ABNORMAL LOW (ref 12.0–15.0)
MCH: 24.6 pg — ABNORMAL LOW (ref 26.0–34.0)
MCHC: 32.3 g/dL (ref 30.0–36.0)
MCV: 76 fL — ABNORMAL LOW (ref 80.0–100.0)
Platelets: 284 10*3/uL (ref 150–400)
RBC: 3.5 MIL/uL — ABNORMAL LOW (ref 3.87–5.11)
RDW: 16.3 % — ABNORMAL HIGH (ref 11.5–15.5)
WBC: 7.4 10*3/uL (ref 4.0–10.5)
nRBC: 0 % (ref 0.0–0.2)

## 2021-04-07 LAB — MAGNESIUM: Magnesium: 1.7 mg/dL (ref 1.7–2.4)

## 2021-04-07 LAB — BASIC METABOLIC PANEL
Anion gap: 8 (ref 5–15)
BUN: 28 mg/dL — ABNORMAL HIGH (ref 6–20)
CO2: 21 mmol/L — ABNORMAL LOW (ref 22–32)
Calcium: 8.3 mg/dL — ABNORMAL LOW (ref 8.9–10.3)
Chloride: 106 mmol/L (ref 98–111)
Creatinine, Ser: 3.63 mg/dL — ABNORMAL HIGH (ref 0.44–1.00)
GFR, Estimated: 15 mL/min — ABNORMAL LOW (ref >60)
Glucose, Bld: 158 mg/dL — ABNORMAL HIGH (ref 70–99)
Potassium: 3.6 mmol/L (ref 3.5–5.1)
Sodium: 135 mmol/L (ref 135–145)

## 2021-04-07 LAB — GLUCOSE, CAPILLARY
Glucose-Capillary: 137 mg/dL — ABNORMAL HIGH (ref 70–99)
Glucose-Capillary: 136 mg/dL — ABNORMAL HIGH (ref 70–99)
Glucose-Capillary: 168 mg/dL — ABNORMAL HIGH (ref 70–99)

## 2021-04-07 MED ORDER — FERROUS SULFATE 325 (65 FE) MG PO TABS: 325.0000 mg | ORAL_TABLET | Freq: Two times a day (BID) | ORAL | 2 refills | Status: DC

## 2021-04-07 MED ORDER — PROMETHAZINE HCL 12.5 MG PO TABS: 12.5000 mg | ORAL_TABLET | Freq: Four times a day (QID) | ORAL | 0 refills | Status: DC | PRN

## 2021-04-07 MED ORDER — HYDRALAZINE HCL 25 MG PO TABS: 25.0000 mg | ORAL_TABLET | Freq: Two times a day (BID) | ORAL | 2 refills | Status: DC

## 2021-04-07 MED ORDER — "INSULIN SYRINGE-NEEDLE U-100 25G X 1"" 1 ML MISC": 2 refills | Status: DC

## 2021-04-07 MED ORDER — NOVOLOG 100 UNIT/ML ~~LOC~~ SOLN
SUBCUTANEOUS | 2 refills | Status: DC
Start: 2021-04-07 — End: 2022-08-13

## 2021-04-07 MED ORDER — METOPROLOL SUCCINATE ER 25 MG PO TB24: 25.0000 mg | ORAL_TABLET | Freq: Every day | ORAL | 2 refills | Status: DC

## 2021-04-07 MED ORDER — AMLODIPINE BESYLATE 10 MG PO TABS: 10.0000 mg | ORAL_TABLET | Freq: Every day | ORAL | 2 refills | Status: DC

## 2021-04-07 MED ORDER — ONDANSETRON 4 MG PO TBDP: 4.0000 mg | ORAL_TABLET | Freq: Three times a day (TID) | ORAL | 2 refills | Status: DC | PRN

## 2021-04-07 NOTE — Progress Notes (Signed)
Patient discharging home. Tele removed and CCMD notified. IV removed without complications. Discharge instructions completed and medication administration discussed. All questions answered. Waiting for ride and letter for work.  ?[name]  ?

## 2021-04-07 NOTE — Progress Notes (Signed)
Pt had a complaint of nausea, but no vomiting. She is able to tolerate oral clear liquid better today. She gets scheduled Reglan q 8 hr. LR 125 ml/hr. BP 154/97-165/79 mmHg, HR 95-110, NSR/ ST, SPO2 100 % on room air. She expressed some anxiety tonight and requested Ativan 2.5 mg. Pt was able to rest well after med given. No acute distress. We will monitor. ? ?Kennyth Lose, RN ?

## 2021-04-07 NOTE — Discharge Summary (Addendum)
Physician Discharge Summary   Patient: [name] MRN: [id number] DOB: July 25, 1980  Admit date:     04/05/2021  Discharge date: 04/07/21  Discharge Physician: Joycelyn Das   PCP: Patient, No Pcp Per (Inactive)   Recommendations at discharge:   Follow-up with your primary care physician in 1 week. Patient should be emphasized on low residue low-fat frequent meals.   Patient should be emphasized on compliance with antihypertensives.    Discharge Diagnoses: Principal Problem:   Intractable nausea and vomiting Active Problems:   Hypertensive urgency   CKD (chronic kidney disease), stage IV (HCC)   Type 2 diabetes mellitus (HCC)   Anemia of chronic disease   Intractable vomiting  Resolved Problems:   * No resolved hospital problems. *  Hospital Course: [name] is a 41 y.o. female with past medical medical history significant for type 2 diabetes, hypertension, CKD stage IV, asthma, anemia of chronic disease, cyclical vomiting, depression/anxiety, history of marijuana and cocaine use presented to the hospital with intractable nausea and vomiting.  In the ED, patient was noted to have elevated blood pressure, tachycardia and elevated creatinine at 3.3.  UA was negative.  Patient was given 1 L of normal saline, Reglan Ativan and was admitted hospital for intractable nausea vomiting with elevated creatinine.  Assessment and Plan: * Intractable nausea and vomiting Likely secondary to cyclical vomiting/cannabinoid hyperemesis syndrome.  Required Zofran and Reglan and even Compazine during hospitalization.  At the time patient is able to tolerate clears and has been advanced to full liquids.  Patient was advised against smoking cannabis and advised frequent small meals.  Received IV fluid hydration during hospitalization.  Advised to remain on soft bland diet for the next few days.  Hypertensive urgency Likely secondary to intractable nausea vomiting and noncompliance to medications for  the last 1 month.  Patient had ran out of antihypertensives for last 1 month.  All the medications have been prescribed with refills.  She was encouraged to follow-up with her primary care physician.  CKD (chronic kidney disease), stage IV (HCC) Latest creatinine of 3.6.  Patient does have CKD stage IV disease.  Will need better blood pressure control as outpatient and regular follow-up.  Type 2 diabetes mellitus (HCC) There is a possibility of gastroparesis as well.  Decreased hemoglobin A1c of 6.4, six months back.  On sliding sliding scale insulin at home.  Anemia of chronic disease Latest hemoglobin of 8.6.  Likely anemia of chronic kidney disease.  Metabolic acidosis.  Secondary to advanced kidney disease.  Slightly improved.  Consultants: None Procedures performed: None Disposition: Home Diet recommendation:  Discharge Diet Orders (From admission, onward)     Start     Ordered   04/07/21 0000  Diet Carb Modified        04/07/21 1433           Carb modified diet DISCHARGE MEDICATION: Allergies as of 04/07/2021       Reactions   Shellfish Allergy Anaphylaxis, Swelling   Morphine And Related Nausea And Vomiting        Medication List     TAKE these medications    amLODipine 10 MG tablet Commonly known as: NORVASC Take 1 tablet (10 mg total) by mouth daily.   ferrous sulfate 325 (65 FE) MG tablet Take 1 tablet (325 mg total) by mouth 2 (two) times daily with a meal.   hydrALAZINE 25 MG tablet Commonly known as: APRESOLINE Take 1 tablet (25 mg total) by mouth 2 (two)  times daily.   Insulin Syringe-Needle U-100 25G X 1" 1 ML Misc For 4 times a day insulin SQ, 1 month supply. Diagnosis E11.65   metoprolol succinate 25 MG 24 hr tablet Commonly known as: TOPROL-XL Take 1 tablet (25 mg total) by mouth daily.   NovoLOG 100 UNIT/ML injection Generic drug: insulin aspart Before each meal 3 times a day , 140-199 - 2 units, 200-250 - 4 units, 251-299 - 6 units,   300-349 - 8 units,  350 or above 10 units. Insulin PEN if approved, provide syringes and needles if needed. Can switch to any other cheaper alternative.   ondansetron 4 MG disintegrating tablet Commonly known as: Zofran ODT Take 1 tablet (4 mg total) by mouth every 8 (eight) hours as needed for nausea or vomiting.   promethazine 12.5 MG tablet Commonly known as: PHENERGAN Take 1 tablet (12.5 mg total) by mouth every 6 (six) hours as needed for refractory nausea / vomiting (not helped with zofran).         Subjective Today, patient was seen and examined at bedside.  Has not vomited on clears and was able to tolerate full liquid.  No abdominal pain wishes to go home  Discharge Exam: Filed Weights   04/06/21 0111  Weight: 83.6 kg   Vitals with BMI 04/07/2021 04/07/2021 04/07/2021  Height - - -  Weight - - -  BMI - - -  Systolic 168 167 161  Diastolic 79 88 85  Pulse 88 80 100   Body mass index is 28.87 kg/m.   General:  Average built, not in obvious distress HENT:   No scleral pallor or icterus noted. Oral mucosa is moist.  Chest:  Clear breath sounds.  Diminished breath sounds bilaterally. No crackles or wheezes.  CVS: S1 &S2 heard. No murmur.  Regular rate and rhythm. Abdomen: Soft, nontender, nondistended.  Bowel sounds are heard.   Extremities: No cyanosis, clubbing or edema.  Peripheral pulses are palpable. Psych: Alert, awake and oriented, normal mood CNS:  No cranial nerve deficits.  Power equal in all extremities.   Skin: Warm and dry.  No rashes noted.   Condition at discharge: fair  The results of significant diagnostics from this hospitalization (including imaging, microbiology, ancillary and laboratory) are listed below for reference.   Imaging Studies: No results found.  Microbiology: Results for orders placed or performed during the hospital encounter of 04/05/21  Resp Panel by RT-PCR (Flu A&B, Covid) Nasopharyngeal Swab     Status: None   Collection Time:  04/05/21  1:32 PM   Specimen: Nasopharyngeal Swab; Nasopharyngeal(NP) swabs in vial transport medium  Result Value Ref Range Status   SARS Coronavirus 2 by RT PCR NEGATIVE NEGATIVE Final    Comment: (NOTE) SARS-CoV-2 target nucleic acids are NOT DETECTED.  The SARS-CoV-2 RNA is generally detectable in upper respiratory specimens during the acute phase of infection. The lowest concentration of SARS-CoV-2 viral copies this assay can detect is 138 copies/mL. A negative result does not preclude SARS-Cov-2 infection and should not be used as the sole basis for treatment or other patient management decisions. A negative result may occur with  improper specimen collection/handling, submission of specimen other than nasopharyngeal swab, presence of viral mutation(s) within the areas targeted by this assay, and inadequate number of viral copies(<138 copies/mL). A negative result must be combined with clinical observations, patient history, and epidemiological information. The expected result is Negative.  Fact Sheet for Patients:  BloggerCourse.com  Fact Sheet for Healthcare  Providers:  SeriousBroker.it  This test is no t yet approved or cleared by the [place] and  has been authorized for detection and/or diagnosis of SARS-CoV-2 by FDA under an Emergency Use Authorization (EUA). This EUA will remain  in effect (meaning this test can be used) for the duration of the COVID-19 declaration under Section 564(b)(1) of the Act, 21 U.S.C.section 360bbb-3(b)(1), unless the authorization is terminated  or revoked sooner.       Influenza A by PCR NEGATIVE NEGATIVE Final   Influenza B by PCR NEGATIVE NEGATIVE Final    Comment: (NOTE) The Xpert Xpress SARS-CoV-2/FLU/RSV plus assay is intended as an aid in the diagnosis of influenza from Nasopharyngeal swab specimens and should not be used as a sole basis for treatment. Nasal washings  and aspirates are unacceptable for Xpert Xpress SARS-CoV-2/FLU/RSV testing.  Fact Sheet for Patients: BloggerCourse.com  Fact Sheet for Healthcare Providers: SeriousBroker.it  This test is not yet approved or cleared by the [place] FDA and has been authorized for detection and/or diagnosis of SARS-CoV-2 by FDA under an Emergency Use Authorization (EUA). This EUA will remain in effect (meaning this test can be used) for the duration of the COVID-19 declaration under Section 564(b)(1) of the Act, 21 U.S.C. section 360bbb-3(b)(1), unless the authorization is terminated or revoked.  Performed at [facility], [address], [place], [place] [address]     Labs: CBC: Recent Labs  Lab 04/05/21 1345 04/06/21 1106 04/07/21 0703  WBC 9.0 9.4 7.4  NEUTROABS 7.4  --   --   HGB 8.1* 9.2* 8.6*  HCT 26.3* 28.6* 26.6*  MCV 78.0* 75.7* 76.0*  PLT 299 299 284   Basic Metabolic Panel: Recent Labs  Lab 04/05/21 1345 04/06/21 1106 04/07/21 0703  NA 136 142 135  K 3.8 3.8 3.6  CL 109 110 106  CO2 15* 20* 21*  GLUCOSE 162* 105* 158*  BUN 30* 29* 28*  CREATININE 3.32* 3.40* 3.63*  CALCIUM 8.4* 8.7* 8.3*  MG  --  1.7 1.7   Liver Function Tests: Recent Labs  Lab 04/05/21 1345  AST 16  ALT 11  ALKPHOS 72  BILITOT 0.6  PROT 7.3  ALBUMIN 3.2*   CBG: Recent Labs  Lab 04/06/21 2021 04/06/21 2341 04/07/21 0424 04/07/21 0741 04/07/21 1120  GLUCAP 112* 135* 137* 136* 168*    Discharge time spent: greater than 30 minutes.  Signed: Joycelyn Das, MD [facility] 04/07/2021

## 2022-08-11 ENCOUNTER — Other Ambulatory Visit: Payer: Self-pay

## 2022-08-11 ENCOUNTER — Inpatient Hospital Stay (HOSPITAL_COMMUNITY)
Admission: EM | Admit: 2022-08-11 | Discharge: 2022-08-13 | DRG: 194 | Disposition: A | Discharge status: Home / Self Care | Payer: Medicaid Other | Attending: Internal Medicine | Admitting: Internal Medicine

## 2022-08-11 ENCOUNTER — Encounter (HOSPITAL_COMMUNITY): Payer: Self-pay

## 2022-08-11 ENCOUNTER — Emergency Department (HOSPITAL_COMMUNITY): Payer: Medicaid Other

## 2022-08-11 ENCOUNTER — Ambulatory Visit (HOSPITAL_COMMUNITY)
Admission: EM | Admit: 2022-08-11 | Discharge: 2022-08-11 | Disposition: A | Discharge status: Home / Self Care | Payer: Medicaid Other | Attending: Family Medicine | Admitting: Family Medicine

## 2022-08-11 DIAGNOSIS — I160 Hypertensive urgency: Secondary | ICD-10-CM | POA: Diagnosis present

## 2022-08-11 DIAGNOSIS — N189 Chronic kidney disease, unspecified: Secondary | ICD-10-CM | POA: Diagnosis present

## 2022-08-11 DIAGNOSIS — Z91148 Patient's other noncompliance with medication regimen for other reason: Secondary | ICD-10-CM

## 2022-08-11 DIAGNOSIS — E663 Overweight: Secondary | ICD-10-CM | POA: Diagnosis present

## 2022-08-11 DIAGNOSIS — E1165 Type 2 diabetes mellitus with hyperglycemia: Secondary | ICD-10-CM

## 2022-08-11 DIAGNOSIS — N3289 Other specified disorders of bladder: Secondary | ICD-10-CM | POA: Diagnosis not present

## 2022-08-11 DIAGNOSIS — Z79899 Other long term (current) drug therapy: Secondary | ICD-10-CM

## 2022-08-11 DIAGNOSIS — Z6828 Body mass index (BMI) 28.0-28.9, adult: Secondary | ICD-10-CM | POA: Diagnosis not present

## 2022-08-11 DIAGNOSIS — R079 Chest pain, unspecified: Secondary | ICD-10-CM | POA: Diagnosis not present

## 2022-08-11 DIAGNOSIS — Y92009 Unspecified place in unspecified non-institutional (private) residence as the place of occurrence of the external cause: Secondary | ICD-10-CM

## 2022-08-11 DIAGNOSIS — R7989 Other specified abnormal findings of blood chemistry: Secondary | ICD-10-CM | POA: Diagnosis present

## 2022-08-11 DIAGNOSIS — Z7151 Drug abuse counseling and surveillance of drug abuser: Secondary | ICD-10-CM

## 2022-08-11 DIAGNOSIS — Z885 Allergy status to narcotic agent status: Secondary | ICD-10-CM

## 2022-08-11 DIAGNOSIS — I129 Hypertensive chronic kidney disease with stage 1 through stage 4 chronic kidney disease, or unspecified chronic kidney disease: Secondary | ICD-10-CM | POA: Diagnosis present

## 2022-08-11 DIAGNOSIS — D631 Anemia in chronic kidney disease: Secondary | ICD-10-CM | POA: Diagnosis present

## 2022-08-11 DIAGNOSIS — N184 Chronic kidney disease, stage 4 (severe): Secondary | ICD-10-CM | POA: Diagnosis present

## 2022-08-11 DIAGNOSIS — E1122 Type 2 diabetes mellitus with diabetic chronic kidney disease: Secondary | ICD-10-CM | POA: Diagnosis present

## 2022-08-11 DIAGNOSIS — T465X6A Underdosing of other antihypertensive drugs, initial encounter: Secondary | ICD-10-CM | POA: Diagnosis present

## 2022-08-11 DIAGNOSIS — Z8249 Family history of ischemic heart disease and other diseases of the circulatory system: Secondary | ICD-10-CM

## 2022-08-11 DIAGNOSIS — D649 Anemia, unspecified: Secondary | ICD-10-CM | POA: Diagnosis not present

## 2022-08-11 DIAGNOSIS — J189 Pneumonia, unspecified organism: Principal | ICD-10-CM | POA: Diagnosis present

## 2022-08-11 DIAGNOSIS — N289 Disorder of kidney and ureter, unspecified: Secondary | ICD-10-CM | POA: Diagnosis not present

## 2022-08-11 DIAGNOSIS — C3492 Malignant neoplasm of unspecified part of left bronchus or lung: Secondary | ICD-10-CM | POA: Diagnosis present

## 2022-08-11 DIAGNOSIS — R911 Solitary pulmonary nodule: Secondary | ICD-10-CM | POA: Diagnosis not present

## 2022-08-11 DIAGNOSIS — F32A Depression, unspecified: Secondary | ICD-10-CM | POA: Diagnosis present

## 2022-08-11 DIAGNOSIS — I10 Essential (primary) hypertension: Secondary | ICD-10-CM | POA: Diagnosis not present

## 2022-08-11 DIAGNOSIS — R112 Nausea with vomiting, unspecified: Secondary | ICD-10-CM | POA: Diagnosis present

## 2022-08-11 DIAGNOSIS — D638 Anemia in other chronic diseases classified elsewhere: Secondary | ICD-10-CM | POA: Diagnosis not present

## 2022-08-11 DIAGNOSIS — Z794 Long term (current) use of insulin: Secondary | ICD-10-CM

## 2022-08-11 DIAGNOSIS — F419 Anxiety disorder, unspecified: Secondary | ICD-10-CM | POA: Diagnosis present

## 2022-08-11 DIAGNOSIS — J168 Pneumonia due to other specified infectious organisms: Secondary | ICD-10-CM | POA: Diagnosis not present

## 2022-08-11 DIAGNOSIS — Z833 Family history of diabetes mellitus: Secondary | ICD-10-CM

## 2022-08-11 DIAGNOSIS — N179 Acute kidney failure, unspecified: Secondary | ICD-10-CM | POA: Diagnosis not present

## 2022-08-11 DIAGNOSIS — Z91013 Allergy to seafood: Secondary | ICD-10-CM

## 2022-08-11 DIAGNOSIS — E0865 Diabetes mellitus due to underlying condition with hyperglycemia: Secondary | ICD-10-CM | POA: Diagnosis not present

## 2022-08-11 DIAGNOSIS — E119 Type 2 diabetes mellitus without complications: Secondary | ICD-10-CM

## 2022-08-11 DIAGNOSIS — Z8619 Personal history of other infectious and parasitic diseases: Secondary | ICD-10-CM

## 2022-08-11 DIAGNOSIS — I3139 Other pericardial effusion (noninflammatory): Secondary | ICD-10-CM | POA: Diagnosis not present

## 2022-08-11 LAB — URINALYSIS, ROUTINE W REFLEX MICROSCOPIC
Bacteria, UA: NONE SEEN
Bilirubin Urine: NEGATIVE
Glucose, UA: 50 mg/dL — AB
Hgb urine dipstick: NEGATIVE
Ketones, ur: NEGATIVE mg/dL
Leukocytes,Ua: NEGATIVE
Nitrite: NEGATIVE
Protein, ur: >=300 mg/dL — AB
Specific Gravity, Urine: 1.012 (ref 1.005–1.030)
pH: 7 (ref 5.0–8.0)

## 2022-08-11 LAB — CBC
HCT: 22 % — ABNORMAL LOW (ref 36.0–46.0)
Hemoglobin: 6.6 g/dL — CL (ref 12.0–15.0)
MCH: 21.3 pg — ABNORMAL LOW (ref 26.0–34.0)
MCHC: 30 g/dL (ref 30.0–36.0)
MCV: 71 fL — ABNORMAL LOW (ref 80.0–100.0)
Platelets: 247 10*3/uL (ref 150–400)
RBC: 3.1 MIL/uL — ABNORMAL LOW (ref 3.87–5.11)
RDW: 20.4 % — ABNORMAL HIGH (ref 11.5–15.5)
WBC: 7.3 10*3/uL (ref 4.0–10.5)
nRBC: 0 % (ref 0.0–0.2)

## 2022-08-11 LAB — BPAM RBC: ISSUE DATE / TIME: 202407241828

## 2022-08-11 LAB — BASIC METABOLIC PANEL
Anion gap: 10 (ref 5–15)
BUN: 35 mg/dL — ABNORMAL HIGH (ref 6–20)
CO2: 19 mmol/L — ABNORMAL LOW (ref 22–32)
Calcium: 7.5 mg/dL — ABNORMAL LOW (ref 8.9–10.3)
Chloride: 106 mmol/L (ref 98–111)
Creatinine, Ser: 4.94 mg/dL — ABNORMAL HIGH (ref 0.44–1.00)
GFR, Estimated: 10 mL/min — ABNORMAL LOW (ref >60)
Glucose, Bld: 94 mg/dL (ref 70–99)
Potassium: 4.8 mmol/L (ref 3.5–5.1)
Sodium: 135 mmol/L (ref 135–145)

## 2022-08-11 LAB — TYPE AND SCREEN
ABO/RH(D): A POS
Antibody Screen: NEGATIVE
Unit division: 0

## 2022-08-11 LAB — IRON AND TIBC
Iron: 27 ug/dL — ABNORMAL LOW (ref 28–170)
Saturation Ratios: 7 % — ABNORMAL LOW (ref 10.4–31.8)
TIBC: 400 ug/dL (ref 250–450)
UIBC: 373 ug/dL

## 2022-08-11 LAB — TROPONIN I (HIGH SENSITIVITY)
Troponin I (High Sensitivity): 25 ng/L — ABNORMAL HIGH (ref <18)
Troponin I (High Sensitivity): 25 ng/L — ABNORMAL HIGH (ref <18)

## 2022-08-11 LAB — HEMOGLOBIN A1C
Hgb A1c MFr Bld: 6.1 % — ABNORMAL HIGH (ref 4.8–5.6)
Mean Plasma Glucose: 128.37 mg/dL

## 2022-08-11 LAB — POC OCCULT BLOOD, ED: Fecal Occult Bld: NEGATIVE

## 2022-08-11 LAB — PREPARE RBC (CROSSMATCH)

## 2022-08-11 LAB — POCT FASTING CBG KUC MANUAL ENTRY: POCT Glucose (KUC): 115 mg/dL — AB (ref 70–99)

## 2022-08-11 LAB — HIV ANTIBODY (ROUTINE TESTING W REFLEX): HIV Screen 4th Generation wRfx: NONREACTIVE

## 2022-08-11 LAB — PROCALCITONIN: Procalcitonin: <0.1 ng/mL

## 2022-08-11 MED ORDER — SODIUM CHLORIDE 0.9 % IV BOLUS
1000.0000 mL | Freq: Once | INTRAVENOUS | Status: AC
  Administered 2022-08-11: 1000 mL via INTRAVENOUS

## 2022-08-11 MED ORDER — SODIUM CHLORIDE 0.9 % IV SOLN
500.0000 mg | INTRAVENOUS | Status: DC
  Administered 2022-08-11 – 2022-08-12 (×2): 500 mg via INTRAVENOUS
  Filled 2022-08-11 (×3): qty 5

## 2022-08-11 MED ORDER — DIPHENHYDRAMINE HCL 50 MG/ML IJ SOLN
25.0000 mg | Freq: Once | INTRAMUSCULAR | Status: AC
Start: 2022-08-11 — End: 2022-08-11
  Administered 2022-08-11: 25 mg via INTRAVENOUS
  Filled 2022-08-11: qty 1

## 2022-08-11 MED ORDER — ACETAMINOPHEN 650 MG RE SUPP: 650.0000 mg | Freq: Four times a day (QID) | RECTAL | Status: DC | PRN

## 2022-08-11 MED ORDER — SODIUM CHLORIDE 0.9% FLUSH
3.0000 mL | Freq: Two times a day (BID) | INTRAVENOUS | Status: DC
  Administered 2022-08-11 – 2022-08-13 (×2): 3 mL via INTRAVENOUS

## 2022-08-11 MED ORDER — ONDANSETRON HCL 4 MG/2ML IJ SOLN
4.0000 mg | Freq: Four times a day (QID) | INTRAMUSCULAR | Status: DC | PRN
  Administered 2022-08-12: 4 mg via INTRAVENOUS
  Filled 2022-08-11: qty 2

## 2022-08-11 MED ORDER — SODIUM CHLORIDE 0.9 % IV SOLN
2.0000 g | INTRAVENOUS | Status: DC
  Administered 2022-08-12 – 2022-08-13 (×2): 2 g via INTRAVENOUS
  Filled 2022-08-11 (×2): qty 20

## 2022-08-11 MED ORDER — AMLODIPINE BESYLATE 10 MG PO TABS
10.0000 mg | ORAL_TABLET | Freq: Every day | ORAL | Status: DC
  Administered 2022-08-12 – 2022-08-13 (×2): 10 mg via ORAL
  Filled 2022-08-11 (×2): qty 1

## 2022-08-11 MED ORDER — SODIUM CHLORIDE 0.9% IV SOLUTION: Freq: Once | INTRAVENOUS | Status: AC

## 2022-08-11 MED ORDER — ONDANSETRON HCL 4 MG PO TABS: 4.0000 mg | ORAL_TABLET | Freq: Four times a day (QID) | ORAL | Status: DC | PRN

## 2022-08-11 MED ORDER — HYDROCODONE-ACETAMINOPHEN 5-325 MG PO TABS
1.0000 | ORAL_TABLET | Freq: Four times a day (QID) | ORAL | Status: DC | PRN
  Administered 2022-08-12: 1 via ORAL
  Filled 2022-08-11: qty 1

## 2022-08-11 MED ORDER — SODIUM CHLORIDE 0.9 % IV SOLN
1.0000 g | Freq: Once | INTRAVENOUS | Status: AC
  Administered 2022-08-11: 1 g via INTRAVENOUS
  Filled 2022-08-11: qty 10

## 2022-08-11 MED ORDER — AMLODIPINE BESYLATE 5 MG PO TABS: 5.0000 mg | ORAL_TABLET | Freq: Once | ORAL | Status: DC

## 2022-08-11 MED ORDER — ACETAMINOPHEN 325 MG PO TABS: 650.0000 mg | ORAL_TABLET | Freq: Four times a day (QID) | ORAL | Status: DC | PRN

## 2022-08-11 MED ORDER — AMLODIPINE BESYLATE 5 MG PO TABS
5.0000 mg | ORAL_TABLET | Freq: Once | ORAL | Status: AC
  Administered 2022-08-11: 5 mg via ORAL
  Filled 2022-08-11: qty 1

## 2022-08-11 MED ORDER — ONDANSETRON HCL 4 MG/2ML IJ SOLN
4.0000 mg | Freq: Once | INTRAMUSCULAR | Status: AC
Start: 2022-08-11 — End: 2022-08-11
  Administered 2022-08-11: 4 mg via INTRAVENOUS
  Filled 2022-08-11: qty 2

## 2022-08-11 MED ORDER — ALBUTEROL SULFATE (2.5 MG/3ML) 0.083% IN NEBU: 2.5000 mg | INHALATION_SOLUTION | Freq: Four times a day (QID) | RESPIRATORY_TRACT | Status: DC | PRN

## 2022-08-11 MED ORDER — HYDRALAZINE HCL 20 MG/ML IJ SOLN
10.0000 mg | INTRAMUSCULAR | Status: DC | PRN
  Administered 2022-08-12: 10 mg via INTRAVENOUS
  Filled 2022-08-11: qty 1

## 2022-08-11 MED ORDER — HYDRALAZINE HCL 20 MG/ML IJ SOLN
10.0000 mg | Freq: Once | INTRAMUSCULAR | Status: AC
  Administered 2022-08-11: 10 mg via INTRAVENOUS
  Filled 2022-08-11: qty 1

## 2022-08-11 MED ORDER — HYDROMORPHONE HCL 1 MG/ML IJ SOLN
1.0000 mg | Freq: Once | INTRAMUSCULAR | Status: AC
  Administered 2022-08-11: 1 mg via INTRAVENOUS
  Filled 2022-08-11: qty 1

## 2022-08-11 MED ORDER — PROCHLORPERAZINE EDISYLATE 10 MG/2ML IJ SOLN
10.0000 mg | Freq: Once | INTRAMUSCULAR | Status: AC
  Administered 2022-08-11: 10 mg via INTRAVENOUS
  Filled 2022-08-11: qty 2

## 2022-08-11 MED ORDER — GUAIFENESIN ER 600 MG PO TB12
600.0000 mg | ORAL_TABLET | Freq: Two times a day (BID) | ORAL | Status: DC
  Administered 2022-08-11 – 2022-08-13 (×3): 600 mg via ORAL
  Filled 2022-08-11 (×4): qty 1

## 2022-08-11 NOTE — ED Provider Notes (Signed)
[place] EMERGENCY DEPARTMENT AT [facility] Provider Note   CSN: [id number] Arrival date & time: 08/11/22  1049     History  Chief Complaint  Patient presents with   Chest Pain    [name] is a 42 y.o. female with past medical history significant for diabetes, asthma, CKD stage IV, previous H. pylori infection, hypertensive urgency who presents with concern for left-sided chest pain that started last night she describes as feeling like someone punched her in the chest.  She reports 10/10 chest pain, she denies any shortness of breath, denies nausea or vomiting.  She reports nothing seems to make the chest pain better or worse.  Patient reports that she still has her menses.  She reports that she has not been in contact with anyone regarding her chronic kidney disease, and has not spoken to anyone about beginning dialysis at some point in the future.  She reports that she has not been taking her blood pressure medication for at least 6 months.  Taking her insulin and iron supplementation however the.  She denies abnormally heavy menses.  She denies any hematemesis, hematochezia, or melena.   Chest Pain      Home Medications Prior to Admission medications   Medication Sig Start Date End Date Taking? Authorizing Provider  amLODipine (NORVASC) 10 MG tablet Take 1 tablet (10 mg total) by mouth daily. 04/07/21   Pokhrel, Rebekah Chesterfield, MD  ferrous sulfate 325 (65 FE) MG tablet Take 1 tablet (325 mg total) by mouth 2 (two) times daily with a meal. 04/07/21   Pokhrel, Laxman, MD  hydrALAZINE (APRESOLINE) 25 MG tablet Take 1 tablet (25 mg total) by mouth 2 (two) times daily. 04/07/21   Pokhrel, Rebekah Chesterfield, MD  insulin aspart (NOVOLOG) 100 UNIT/ML injection Before each meal 3 times a day , 140-199 - 2 units, 200-250 - 4 units, 251-299 - 6 units,  300-349 - 8 units,  350 or above 10 units. Insulin PEN if approved, provide syringes and needles if needed. Can switch to any other cheaper  alternative. 04/07/21   Pokhrel, Rebekah Chesterfield, MD  Insulin Syringe-Needle U-100 25G X 1" 1 ML MISC For 4 times a day insulin SQ, 1 month supply. Diagnosis E11.65 04/07/21   Pokhrel, Rebekah Chesterfield, MD  metoprolol succinate (TOPROL-XL) 25 MG 24 hr tablet Take 1 tablet (25 mg total) by mouth daily. 04/07/21 05/07/21  Pokhrel, Rebekah Chesterfield, MD  ondansetron (ZOFRAN ODT) 4 MG disintegrating tablet Take 1 tablet (4 mg total) by mouth every 8 (eight) hours as needed for nausea or vomiting. 04/07/21   Pokhrel, Rebekah Chesterfield, MD  promethazine (PHENERGAN) 12.5 MG tablet Take 1 tablet (12.5 mg total) by mouth every 6 (six) hours as needed for refractory nausea / vomiting (not helped with zofran). 04/07/21   Pokhrel, Rebekah Chesterfield, MD      Allergies    Shellfish allergy and Morphine and codeine    Review of Systems   Review of Systems  Cardiovascular:  Positive for chest pain.  All other systems reviewed and are negative.   Physical Exam Updated Vital Signs BP (!) 169/102   Pulse 79   Temp 97.6 F (36.4 C) (Oral)   Resp 18   Ht 5\' 7"  (1.702 m)   Wt 81.6 kg   SpO2 100%   BMI 28.19 kg/m  Physical Exam Vitals and nursing note reviewed.  Constitutional:      General: She is not in acute distress.    Appearance: Normal appearance.  HENT:  Head: Normocephalic and atraumatic.  Eyes:     General:        Right eye: No discharge.        Left eye: No discharge.     Comments: Pale mucous membranes  Cardiovascular:     Rate and Rhythm: Normal rate and regular rhythm.     Heart sounds: No murmur heard.    No friction rub. No gallop.  Pulmonary:     Effort: Pulmonary effort is normal.     Breath sounds: Normal breath sounds.  Abdominal:     General: Bowel sounds are normal.     Palpations: Abdomen is soft.  Skin:    General: Skin is warm and dry.     Capillary Refill: Capillary refill takes less than 2 seconds.  Neurological:     Mental Status: She is alert and oriented to person, place, and time.  Psychiatric:        Mood  and Affect: Mood normal.        Behavior: Behavior normal.     ED Results / Procedures / Treatments   Labs (all labs ordered are listed, but only abnormal results are displayed) Labs Reviewed  BASIC METABOLIC PANEL - Abnormal; Notable for the following components:      Result Value   CO2 19 (*)    BUN 35 (*)    Creatinine, Ser 4.94 (*)    Calcium 7.5 (*)    GFR, Estimated 10 (*)    All other components within normal limits  CBC - Abnormal; Notable for the following components:   RBC 3.10 (*)    Hemoglobin 6.6 (*)    HCT 22.0 (*)    MCV 71.0 (*)    MCH 21.3 (*)    RDW 20.4 (*)    All other components within normal limits  URINALYSIS, ROUTINE W REFLEX MICROSCOPIC - Abnormal; Notable for the following components:   Glucose, UA 50 (*)    Protein, ur >=300 (*)    All other components within normal limits  TROPONIN I (HIGH SENSITIVITY) - Abnormal; Notable for the following components:   Troponin I (High Sensitivity) 25 (*)    All other components within normal limits  TROPONIN I (HIGH SENSITIVITY) - Abnormal; Notable for the following components:   Troponin I (High Sensitivity) 25 (*)    All other components within normal limits  POC OCCULT BLOOD, ED  TYPE AND SCREEN  PREPARE RBC (CROSSMATCH)    EKG None  Radiology CT Chest Wo Contrast  Result Date: 08/11/2022 CLINICAL DATA:  Chest pain.  Abnormal chest x-ray EXAM: CT CHEST WITHOUT CONTRAST TECHNIQUE: Multidetector CT imaging of the chest was performed following the standard protocol without IV contrast. RADIATION DOSE REDUCTION: This exam was performed according to the departmental dose-optimization program which includes automated exposure control, adjustment of the mA and/or kV according to patient size and/or use of iterative reconstruction technique. COMPARISON:  X-ray 08/11/2022.  CT 09/04/2010 FINDINGS: Cardiovascular: Normal heart size. Small-moderate-sized pericardial effusion. Thoracic aorta is nonaneurysmal.  Coronary artery atherosclerosis. Central pulmonary vasculature is nondilated. Mediastinum/Nodes: No axillary or mediastinal lymphadenopathy. Evaluation of the hilar structures is limited in the absence of intravenous contrast. Within this limitation, no obvious hilar adenopathy or mass is identified. Thyroid gland, trachea, and esophagus demonstrate no significant abnormality. Lungs/Pleura: Focal nodular density within the superior segment of the left lower lobe measuring 2.6 x 1.7 x 1.9 cm (series 4, image 44). This density demonstrates irregular margins and abuts the fissure.  Air bronchogram is present within the inferior aspect of the density. The remaining lung fields are otherwise clear. No additional pulmonary nodules or airspace consolidations. No pleural effusion or pneumothorax. Upper Abdomen: No acute abnormality. Musculoskeletal: No chest wall mass or suspicious bone lesions identified. IMPRESSION: 1. Focal nodular density within the superior segment of the left lower lobe measuring 2.6 x 1.7 x 1.9 cm. The presence of air bronchograms favors this to be a focal pneumonia. Underlying lung malignancy is difficult to exclude. Given that this finding is visible radiographically, recommend follow-up chest x-rays in 6-8 weeks after appropriate treatment to ensure resolution. If findings do not resolve, a repeat chest CT should be performed at that time. 2. Small-moderate-sized pericardial effusion. 3. Coronary artery atherosclerosis. Electronically Signed   By: Duanne Guess D.O.   On: 08/11/2022 15:00   DG Chest 2 View  Result Date: 08/11/2022 CLINICAL DATA:  Chest pain. EXAM: CHEST - 2 VIEW COMPARISON:  August 02, 2019. FINDINGS: The heart size and mediastinal contours are within normal limits. Small calcified granuloma is again noted in right upper lobe. Interval development of small irregular nodular density seen in left upper lobe. The visualized skeletal structures are unremarkable. IMPRESSION:  Interval development of small regular nodular density seen in left upper lobe. While this may represent focal inflammation, neoplasm cannot be excluded. CT scan of the chest is recommended for further evaluation. Electronically Signed   By: Lupita Raider M.D.   On: 08/11/2022 12:44    Procedures .Critical Care  Performed by: Olene Floss, PA-C Authorized by: Olene Floss, PA-C   Critical care provider statement:    Critical care time (minutes):  35   Critical care was necessary to treat or prevent imminent or life-threatening deterioration of the following conditions:  Circulatory failure (symptomatic anemia requiring transfusion)   Critical care was time spent personally by me on the following activities:  Development of treatment plan with patient or surrogate, discussions with consultants, evaluation of patient's response to treatment, examination of patient, ordering and review of laboratory studies, ordering and review of radiographic studies, ordering and performing treatments and interventions, pulse oximetry, re-evaluation of patient's condition and review of old charts   Care discussed with: admitting provider       Medications Ordered in ED Medications  cefTRIAXone (ROCEPHIN) 1 g in sodium chloride 0.9 % 100 mL IVPB (has no administration in time range)  azithromycin (ZITHROMAX) 500 mg in sodium chloride 0.9 % 250 mL IVPB (has no administration in time range)  0.9 %  sodium chloride infusion (Manually program via Guardrails IV Fluids) (has no administration in time range)  HYDROmorphone (DILAUDID) injection 1 mg (1 mg Intravenous Given 08/11/22 1343)  hydrALAZINE (APRESOLINE) injection 10 mg (10 mg Intravenous Given 08/11/22 1343)  amLODipine (NORVASC) tablet 5 mg (5 mg Oral Given 08/11/22 1343)  ondansetron (ZOFRAN) injection 4 mg (4 mg Intravenous Given 08/11/22 1413)  prochlorperazine (COMPAZINE) injection 10 mg (10 mg Intravenous Given 08/11/22 1449)   diphenhydrAMINE (BENADRYL) injection 25 mg (25 mg Intravenous Given 08/11/22 1448)  sodium chloride 0.9 % bolus 1,000 mL (1,000 mLs Intravenous New Bag/Given 08/11/22 1504)    ED Course/ Medical Decision Making/ A&P Clinical Course as of 08/11/22 1528  Wed Aug 11, 2022  1249 Basic metabolic panel [NF]  1252 Chest pain started last night -- reports 10/10 pain, constant in nature. Denies shob. Pain has persisted. No changed in vision, no headache. Stopped her bp medicine 6 months ago.  [  CP]    Clinical Course User Index [CP] Terez Freimark, Harrel Carina, PA-C [NF] Flowers, Illene Bolus                             Medical Decision Making Amount and/or Complexity of Data Reviewed Labs: ordered. Radiology: ordered.  Risk Prescription drug management.   This patient is a 42 y.o. female who presents to the ED for concern of left sided chest pain, this involves an extensive number of treatment options, and is a complaint that carries with it a high risk of complications and morbidity. The emergent differential diagnosis prior to evaluation includes, but is not limited to,  ACS, AAS, PE, Mallory-Weiss, Boerhaave's, Pneumonia, acute bronchitis, asthma or COPD exacerbation, anxiety, MSK pain or traumatic injury to the chest, acid reflux versus other . This is not an exhaustive differential.   Past Medical History / Co-morbidities / Social History:  diabetes, asthma, CKD stage IV, previous H. pylori infection, hypertensive urgency  Additional history: Chart reviewed. Pertinent results include: reviewed   Physical Exam: Physical exam performed. The pertinent findings include: Reviewed lab work, imaging from previous emergency department visits and hospital admissions, she has been lost to follow-up for a year  Lab Tests: I ordered, and personally interpreted labs.  The pertinent results include: BMP with worsening kidney function, BUN elevated 35, creatinine 4.94 from her baseline a year ago  which was around 3.9.  Her CBC with no critical anemia, hemoglobin 6.6 with microcytic quality.  Previous baseline around 8.6.  UA is unremarkable, Hemoccult is negative.  Her initial troponin is mildly elevated at 25, delta is flat at 25, suspect demand ischemia secondary to her critical anemia.   Imaging Studies: I ordered imaging studies including plain film chest x-ray. I independently visualized and interpreted imaging which showed she has a left upper lobar mass with recommendation for further evaluation with CT, CT chest without contrast shows a consolidation which seems consistent with focal pneumonia, we will begin antibiotics. I agree with the radiologist interpretation.   Cardiac Monitoring:  The patient was maintained on a cardiac monitor.  My attending physician Dr. Rosalia Hammers viewed and interpreted the cardiac monitored which showed an underlying rhythm of: NSR. I agree with this interpretation.   Medications: I ordered medication including fluids for AKI, compazine, benadryl, zofran for nausea, amlodipine, hydralazine for HTN, dilaudid for pain.   3:28 PM Care of [name] transferred to [facility] and Dr. Rubin Payor at the end of my shift as the patient will require reassessment once labs/imaging have resulted. Patient presentation, ED course, and plan of care discussed with review of all pertinent labs and imaging. Please see his/her note for further details regarding further ED course and disposition. Plan at time of handoff is admit for AKI, symptomatic anemia, and new pneumonia. This may be altered or completely changed at the discretion of the oncoming team pending results of further workup.  I discussed this case with my attending physician Dr. Rosalia Hammers who cosigned this note including patient's presenting symptoms, physical exam, and planned diagnostics and interventions. Attending physician stated agreement with plan or made changes to plan which were implemented.    Final  Clinical Impression(s) / ED Diagnoses Final diagnoses:  Symptomatic anemia  Pneumonia of left upper lobe due to infectious organism  AKI (acute kidney injury) (HCC)    Rx / DC Orders ED Discharge Orders     None  Olene Floss, PA-C 08/11/22 1528    Margarita Grizzle, MD 08/12/22 [phone]

## 2022-08-11 NOTE — ED Notes (Signed)
ED TO INPATIENT HANDOFF REPORT  ED Nurse Name and Phone #: Amberlea Spagnuolo [phone]  S Name/Age/Gender [name] 42 y.o. female Room/Bed: 035C/035C  Code Status   Code Status: Full Code  Home/SNF/Other Home Patient oriented to: self, place, time, and situation Is this baseline? Yes   Triage Complete: Triage complete  Chief Complaint Symptomatic anemia [D64.9]  Triage Note Pt to ED from UC c/o left sided chest pain since last night. Pt denies SHOB denies N/V. No s/s of acute distress noted in triage.    Allergies Allergies  Allergen Reactions   Shellfish Allergy Anaphylaxis and Swelling   Morphine And Codeine Nausea And Vomiting    Level of Care/Admitting Diagnosis ED Disposition     ED Disposition  Admit   Condition  --   Comment  Hospital Area: [facility] [[id number]]  Level of Care: Telemetry Medical [104]  May admit patient to [facility] or [facility] if equivalent level of care is available:: No  Covid Evaluation: Asymptomatic - no recent exposure (last 10 days) testing not required  Diagnosis: Symptomatic anemia [5409811]  Admitting Physician: Clydie Braun [[id number]]  Attending Physician: Clydie Braun [[id number]]  Certification:: I certify this patient will need inpatient services for at least 2 midnights  Estimated Length of Stay: 2          B Medical/Surgery History Past Medical History:  Diagnosis Date   Anxiety    Asthma    Depression    Diabetes mellitus    Hyperemesis arising during pregnancy    Hypertension    Mental disorder    Past Surgical History:  Procedure Laterality Date   ESOPHAGOGASTRODUODENOSCOPY (EGD) WITH PROPOFOL N/A 08/07/2019   Procedure: ESOPHAGOGASTRODUODENOSCOPY (EGD) WITH PROPOFOL;  Surgeon: Hilarie Fredrickson, MD;  Location: [facility] ENDOSCOPY;  Service: Endoscopy;  Laterality: N/A;   NO PAST SURGERIES     TUBAL LIGATION  06/20/2011   Procedure: POST PARTUM TUBAL LIGATION;  Surgeon: Catalina Antigua, MD;  Location: [facility]  ORS;  Service: Gynecology;  Laterality: Bilateral;  Bilateral post partum tubal ligation     A IV Location/Drains/Wounds Patient Lines/Drains/Airways Status     Active Line/Drains/Airways     Name Placement date Placement time Site Days   Peripheral IV 08/11/22 20 G Left Antecubital 08/11/22  1343  Antecubital  less than 1            Intake/Output Last 24 hours No intake or output data in the 24 hours ending 08/11/22 1608  Labs/Imaging Results for orders placed or performed during the hospital encounter of 08/11/22 (from the past 48 hour(s))  Urinalysis, Routine w reflex microscopic -Urine, Clean Catch     Status: Abnormal   Collection Time: 08/11/22 11:09 AM  Result Value Ref Range   Color, Urine YELLOW YELLOW   APPearance CLEAR CLEAR   Specific Gravity, Urine 1.012 1.005 - 1.030   pH 7.0 5.0 - 8.0   Glucose, UA 50 (A) NEGATIVE mg/dL   Hgb urine dipstick NEGATIVE NEGATIVE   Bilirubin Urine NEGATIVE NEGATIVE   Ketones, ur NEGATIVE NEGATIVE mg/dL   Protein, ur >=657 (A) NEGATIVE mg/dL   Nitrite NEGATIVE NEGATIVE   Leukocytes,Ua NEGATIVE NEGATIVE   RBC / HPF 0-5 0 - 5 RBC/hpf   WBC, UA 0-5 0 - 5 WBC/hpf   Bacteria, UA NONE SEEN NONE SEEN   Squamous Epithelial / HPF 0-5 0 - 5 /HPF    Comment: Performed at [facility], [address]  [place], [place] [address]  Basic metabolic panel     Status: Abnormal   Collection Time: 08/11/22 11:53 AM  Result Value Ref Range   Sodium 135 135 - 145 mmol/L   Potassium 4.8 3.5 - 5.1 mmol/L   Chloride 106 98 - 111 mmol/L   CO2 19 (L) 22 - 32 mmol/L   Glucose, Bld 94 70 - 99 mg/dL    Comment: Glucose reference range applies only to samples taken after fasting for at least 8 hours.   BUN 35 (H) 6 - 20 mg/dL   Creatinine, Ser 6.04 (H) 0.44 - 1.00 mg/dL   Calcium 7.5 (L) 8.9 - 10.3 mg/dL   GFR, Estimated 10 (L) >60 mL/min    Comment: (NOTE) Calculated using the CKD-EPI Creatinine Equation (2021)    Anion gap 10 5 - 15     Comment: Performed at [facility], [address] [place], [place] [address]  CBC     Status: Abnormal   Collection Time: 08/11/22 11:53 AM  Result Value Ref Range   WBC 7.3 4.0 - 10.5 K/uL   RBC 3.10 (L) 3.87 - 5.11 MIL/uL   Hemoglobin 6.6 (LL) 12.0 - 15.0 g/dL    Comment: REPEATED TO VERIFY Reticulocyte Hemoglobin testing may be clinically indicated, consider ordering this additional test [id number] THIS CRITICAL RESULT HAS VERIFIED AND BEEN CALLED TO ASHLEY STRADER,RN BY ZELDA BEECH ON 07 24 2024 AT 1226, AND HAS BEEN READ BACK.     HCT 22.0 (L) 36.0 - 46.0 %   MCV 71.0 (L) 80.0 - 100.0 fL   MCH 21.3 (L) 26.0 - 34.0 pg   MCHC 30.0 30.0 - 36.0 g/dL   RDW 95.6 (H) 21.3 - 08.6 %   Platelets 247 150 - 400 K/uL   nRBC 0.0 0.0 - 0.2 %    Comment: Performed at [facility], [address], [place], [place] [address]  Troponin I (High Sensitivity)     Status: Abnormal   Collection Time: 08/11/22 11:53 AM  Result Value Ref Range   Troponin I (High Sensitivity) 25 (H) <18 ng/L    Comment: (NOTE) Elevated high sensitivity troponin I (hsTnI) values and significant  changes across serial measurements may suggest ACS but many other  chronic and acute conditions are known to elevate hsTnI results.  Refer to the "Links" section for chest pain algorithms and additional  guidance. Performed at [facility], [address], [place], [place] [address]   Troponin I (High Sensitivity)     Status: Abnormal   Collection Time: 08/11/22  1:11 PM  Result Value Ref Range   Troponin I (High Sensitivity) 25 (H) <18 ng/L    Comment: (NOTE) Elevated high sensitivity troponin I (hsTnI) values and significant  changes across serial measurements may suggest ACS but many other  chronic and acute conditions are known to elevate hsTnI results.  Refer to the "Links" section for chest pain algorithms and additional  guidance. Performed at [facility], [address]  [place], [place] [address]   Type and screen [facility]     Status: None (Preliminary result)   Collection Time: 08/11/22  1:51 PM  Result Value Ref Range   ABO/RH(D) A POS    Antibody Screen NEG    Sample Expiration      08/14/2022,2359 Performed at [facility], [address], [place], [place] [address]    Unit Number [id number]  Blood Component Type RED CELLS,LR    Unit division 00    Status of Unit ALLOCATED    Transfusion Status OK TO TRANSFUSE    Crossmatch Result Compatible   POC occult blood, ED     Status: None   Collection Time: 08/11/22  1:55 PM  Result Value Ref Range   Fecal Occult Bld NEGATIVE NEGATIVE  Prepare RBC (crossmatch)     Status: None   Collection Time: 08/11/22  3:30 PM  Result Value Ref Range   Order Confirmation      ORDER PROCESSED BY BLOOD BANK Performed at [facility], [address], [place], [place] [address]    CT Chest Wo Contrast  Result Date: 08/11/2022 CLINICAL DATA:  Chest pain.  Abnormal chest x-ray EXAM: CT CHEST WITHOUT CONTRAST TECHNIQUE: Multidetector CT imaging of the chest was performed following the standard protocol without IV contrast. RADIATION DOSE REDUCTION: This exam was performed according to the departmental dose-optimization program which includes automated exposure control, adjustment of the mA and/or kV according to patient size and/or use of iterative reconstruction technique. COMPARISON:  X-ray 08/11/2022.  CT 09/04/2010 FINDINGS: Cardiovascular: Normal heart size. Small-moderate-sized pericardial effusion. Thoracic aorta is nonaneurysmal. Coronary artery atherosclerosis. Central pulmonary vasculature is nondilated. Mediastinum/Nodes: No axillary or mediastinal lymphadenopathy. Evaluation of the hilar structures is limited in the absence of intravenous contrast. Within this limitation, no obvious hilar adenopathy or mass is identified. Thyroid gland, trachea, and esophagus demonstrate no  significant abnormality. Lungs/Pleura: Focal nodular density within the superior segment of the left lower lobe measuring 2.6 x 1.7 x 1.9 cm (series 4, image 44). This density demonstrates irregular margins and abuts the fissure. Air bronchogram is present within the inferior aspect of the density. The remaining lung fields are otherwise clear. No additional pulmonary nodules or airspace consolidations. No pleural effusion or pneumothorax. Upper Abdomen: No acute abnormality. Musculoskeletal: No chest wall mass or suspicious bone lesions identified. IMPRESSION: 1. Focal nodular density within the superior segment of the left lower lobe measuring 2.6 x 1.7 x 1.9 cm. The presence of air bronchograms favors this to be a focal pneumonia. Underlying lung malignancy is difficult to exclude. Given that this finding is visible radiographically, recommend follow-up chest x-rays in 6-8 weeks after appropriate treatment to ensure resolution. If findings do not resolve, a repeat chest CT should be performed at that time. 2. Small-moderate-sized pericardial effusion. 3. Coronary artery atherosclerosis. Electronically Signed   By: Duanne Guess D.O.   On: 08/11/2022 15:00   DG Chest 2 View  Result Date: 08/11/2022 CLINICAL DATA:  Chest pain. EXAM: CHEST - 2 VIEW COMPARISON:  August 02, 2019. FINDINGS: The heart size and mediastinal contours are within normal limits. Small calcified granuloma is again noted in right upper lobe. Interval development of small irregular nodular density seen in left upper lobe. The visualized skeletal structures are unremarkable. IMPRESSION: Interval development of small regular nodular density seen in left upper lobe. While this may represent focal inflammation, neoplasm cannot be excluded. CT scan of the chest is recommended for further evaluation. Electronically Signed   By: Lupita Raider M.D.   On: 08/11/2022 12:44    Pending Labs Unresulted Labs (From admission, onward)     Start      Ordered   08/12/22 0500  CBC  Tomorrow morning,   R        08/11/22 1550   08/12/22 0500  Comprehensive metabolic panel  Tomorrow morning,   R  08/11/22 1550   08/11/22 1550  Hemoglobin A1c  Add-on,   AD        08/11/22 1550   08/11/22 1542  HIV Antibody (routine testing w rflx)  (HIV Antibody (Routine testing w reflex) panel)  Once,   R        08/11/22 1550            Vitals/Pain Today's Vitals   08/11/22 1422 08/11/22 1459 08/11/22 1515 08/11/22 1530  BP: (!) 169/102  (!) 180/80 (!) 183/88  Pulse:      Resp: 18  (!) 22 13  Temp:  97.6 F (36.4 C)    TempSrc:  Oral    SpO2:      Weight:      Height:      PainSc:        Isolation Precautions No active isolations  Medications Medications  azithromycin (ZITHROMAX) 500 mg in sodium chloride 0.9 % 250 mL IVPB (500 mg Intravenous New Bag/Given 08/11/22 1607)  0.9 %  sodium chloride infusion (Manually program via Guardrails IV Fluids) (has no administration in time range)  cefTRIAXone (ROCEPHIN) 1 g in sodium chloride 0.9 % 100 mL IVPB (has no administration in time range)  amLODipine (NORVASC) tablet 5 mg (has no administration in time range)  amLODipine (NORVASC) tablet 10 mg (has no administration in time range)  sodium chloride flush (NS) 0.9 % injection 3 mL (has no administration in time range)  acetaminophen (TYLENOL) tablet 650 mg (has no administration in time range)    Or  acetaminophen (TYLENOL) suppository 650 mg (has no administration in time range)  albuterol (PROVENTIL) (2.5 MG/3ML) 0.083% nebulizer solution 2.5 mg (has no administration in time range)  hydrALAZINE (APRESOLINE) injection 10 mg (has no administration in time range)  ondansetron (ZOFRAN) tablet 4 mg (has no administration in time range)    Or  ondansetron (ZOFRAN) injection 4 mg (has no administration in time range)  cefTRIAXone (ROCEPHIN) 2 g in sodium chloride 0.9 % 100 mL IVPB (has no administration in time range)  guaiFENesin  (MUCINEX) 12 hr tablet 600 mg (has no administration in time range)  HYDROmorphone (DILAUDID) injection 1 mg (1 mg Intravenous Given 08/11/22 1343)  hydrALAZINE (APRESOLINE) injection 10 mg (10 mg Intravenous Given 08/11/22 1343)  amLODipine (NORVASC) tablet 5 mg (5 mg Oral Given 08/11/22 1343)  ondansetron (ZOFRAN) injection 4 mg (4 mg Intravenous Given 08/11/22 1413)  prochlorperazine (COMPAZINE) injection 10 mg (10 mg Intravenous Given 08/11/22 1449)  diphenhydrAMINE (BENADRYL) injection 25 mg (25 mg Intravenous Given 08/11/22 1448)  sodium chloride 0.9 % bolus 1,000 mL (1,000 mLs Intravenous New Bag/Given 08/11/22 1504)  cefTRIAXone (ROCEPHIN) 1 g in sodium chloride 0.9 % 100 mL IVPB (0 g Intravenous Stopped 08/11/22 1607)    Mobility walks     Focused Assessments Cardiac Assessment Handoff:    Lab Results  Component Value Date   CKTOTAL 103 09/03/2010   CKMB 2.0 09/03/2010   TROPONINI <0.30 09/03/2010   Lab Results  Component Value Date   DDIMER 1.54 (H) 08/25/2018   Does the Patient currently have chest pain? No    R Recommendations: See Admitting Provider Note  Report given to:   Additional Notes:

## 2022-08-11 NOTE — H&P (Addendum)
History and Physical    Patient: [name] ZOX:[id number] DOB: 1981-01-17 DOA: 08/11/2022 DOS: the patient was seen and examined on 08/11/2022 PCP: Default, Provider, MD  Patient coming from: Home  Chief Complaint:  Chief Complaint  Patient presents with   Chest Pain   HPI: [name] is a 42 y.o. female with medical history significant of hypertension, diabetes mellitus type 2, CKD stage IV, anemia, anxiety, depression who presents with left-sided chest pain that started yesterday evening while she was sitting watching television.  She stated that it felt like a someone was punching her chest.  Pain was a 10 out of 10 at its worst.  She did not take anything for the symptoms last night and went to sleep.  However this morning pain was still present.  She did have an episode of nausea and vomiting yesterday morning, but did not eat much of anything throughout the day.  Denies having any recent fever, shortness of breath, cough, or dysuria.  Any kind of movement seem to worsen symptoms.  She had not been being followed by primary care provider or taken medications for history of hypertension or diabetes.  Patient denies smoking, alcohol abuse, or drug use.  She has not been on any kind of iron supplement.  In the emergency department patient was noted to be afebrile, blood pressures elevated up to 223/94, and O2 saturations currently maintained on room air.  Labs significant for WBC 7.3, hemoglobin 6.6, MCV 71, CO2 19, BUN 35, creatinine 4.94, and calcium 7.5.  Patient was typed and screened for possible need of blood products in order to be transfused 1 unit of packed red blood cells.  Patient was given 1 L normal saline IV fluids, antiemetics, hydralazine 10 mg IV, Rocephin, and azithromycin.  Review of Systems: As mentioned in the history of present illness. All other systems reviewed and are negative. Past Medical History:  Diagnosis Date   Anxiety    Asthma    Depression    Diabetes  mellitus    Hyperemesis arising during pregnancy    Hypertension    Mental disorder    Past Surgical History:  Procedure Laterality Date   ESOPHAGOGASTRODUODENOSCOPY (EGD) WITH PROPOFOL N/A 08/07/2019   Procedure: ESOPHAGOGASTRODUODENOSCOPY (EGD) WITH PROPOFOL;  Surgeon: Hilarie Fredrickson, MD;  Location: [facility] ENDOSCOPY;  Service: Endoscopy;  Laterality: N/A;   NO PAST SURGERIES     TUBAL LIGATION  06/20/2011   Procedure: POST PARTUM TUBAL LIGATION;  Surgeon: Catalina Antigua, MD;  Location: [facility] ORS;  Service: Gynecology;  Laterality: Bilateral;  Bilateral post partum tubal ligation   Social History:  reports that she has never smoked. She has never used smokeless tobacco. She reports that she does not drink alcohol and does not use drugs.  Allergies  Allergen Reactions   Shellfish Allergy Anaphylaxis and Swelling   Morphine And Codeine Nausea And Vomiting    Family History  Problem Relation Age of Onset   Heart disease Mother        CHF   Diabetes Sister    Anesthesia problems Neg Hx     Prior to Admission medications   Medication Sig Start Date End Date Taking? Authorizing Provider  amLODipine (NORVASC) 10 MG tablet Take 1 tablet (10 mg total) by mouth daily. 04/07/21   Pokhrel, Rebekah Chesterfield, MD  ferrous sulfate 325 (65 FE) MG tablet Take 1 tablet (325 mg total) by mouth 2 (two) times daily with a meal. 04/07/21   Pokhrel, Rebekah Chesterfield, MD  hydrALAZINE (  APRESOLINE) 25 MG tablet Take 1 tablet (25 mg total) by mouth 2 (two) times daily. 04/07/21   Pokhrel, Rebekah Chesterfield, MD  insulin aspart (NOVOLOG) 100 UNIT/ML injection Before each meal 3 times a day , 140-199 - 2 units, 200-250 - 4 units, 251-299 - 6 units,  300-349 - 8 units,  350 or above 10 units. Insulin PEN if approved, provide syringes and needles if needed. Can switch to any other cheaper alternative. 04/07/21   Pokhrel, Rebekah Chesterfield, MD  Insulin Syringe-Needle U-100 25G X 1" 1 ML MISC For 4 times a day insulin SQ, 1 month supply. Diagnosis E11.65 04/07/21    Pokhrel, Rebekah Chesterfield, MD  metoprolol succinate (TOPROL-XL) 25 MG 24 hr tablet Take 1 tablet (25 mg total) by mouth daily. 04/07/21 05/07/21  Pokhrel, Rebekah Chesterfield, MD  ondansetron (ZOFRAN ODT) 4 MG disintegrating tablet Take 1 tablet (4 mg total) by mouth every 8 (eight) hours as needed for nausea or vomiting. 04/07/21   Pokhrel, Rebekah Chesterfield, MD  promethazine (PHENERGAN) 12.5 MG tablet Take 1 tablet (12.5 mg total) by mouth every 6 (six) hours as needed for refractory nausea / vomiting (not helped with zofran). 04/07/21   Joycelyn Das, MD    Physical Exam: Vitals:   08/11/22 1340 08/11/22 1345 08/11/22 1422 08/11/22 1459  BP: (!) 151/137 (!) 223/94 (!) 169/102   Pulse: 83 79    Resp: 13 10 18    Temp:    97.6 F (36.4 C)  TempSrc:    Oral  SpO2: 100% 100%    Weight:      Height:        Constitutional: NAD, calm, comfortable Eyes: PERRL, lids and conjunctivae normal ENMT: Mucous membranes are moist. Posterior pharynx clear of any exudate or lesions.Normal dentition.  Neck: normal, supple, no masses, no thyromegaly Respiratory: clear to auscultation bilaterally, no wheezing, no crackles. Normal respiratory effort. No accessory muscle use.  Cardiovascular: Regular rate and rhythm, no murmurs / rubs / gallops. No extremity edema. 2+ pedal pulses. No carotid bruits.  Abdomen: no tenderness, no masses palpated. No hepatosplenomegaly. Bowel sounds positive.  Musculoskeletal: no clubbing / cyanosis. No joint deformity upper and lower extremities. Good ROM, no contractures. Normal muscle tone.  Skin: no rashes, lesions, ulcers. No induration Neurologic: CN 2-12 grossly intact. Sensation intact, DTR normal. Strength 5/5 in all 4.  Psychiatric: Normal judgment and insight. Alert and oriented x 3. Normal mood.   Data Reviewed:  EKG revealed normal sinus rhythm at 77 bpm with nonspecific ST wave changes.  Reviewed labs, imaging, and pertinent records as noted above in HPI.  Assessment and Plan:  Suspected  community-acquired pneumonia Patient presents with complaints of left-sided chest pain that started yesterday evening.  CT scan of the chest with contrast noted focal nodular density within the superior segment of the left lower lobe measuring 2.6 x 1.7 x 1.9 cm with air bronchogram concerning for pneumonia, but malignancy could not be ruled out.  Patient been started on empiric antibiotics of Rocephin and azithromycin. -Admit to a telemetry bed -Incentive spirometry and flutter valve -Add on procalcitonin -Continue Rocephin and azithromycin -Mucinex  Anemia of chronic disease Acute on chronic.  On admission hemoglobin noted to be as low as 6.6 with low MCV and MCH.  Last hemoglobin was 8.6 back in 03/2021 patient has been typed and screened and ordered to be transfused 1 unit of PRBCs.  Denies any complaints of bleeding or blood in stools. -Continue with transfusion of 1 unit of PRBCs -Check iron  studies and replace noted to be significantly low as expected -Continue to monitor H&H and transfuse blood products as needed to maintain hemoglobin greater than 7 g/dL  Hypertensive urgency On admission blood pressure elevated up to 223/94.  Patient had not been on any blood pressure medications. -Continue amlodipine 10 mg daily  Elevated troponin Patient presented with complaints of left-sided chest pain.  Sensitivity troponins just mildly elevated, but flat at 25.  CT revealed concern for left-sided pneumonia as possible cause for patient's chest pain symptoms. -Continue to monitor -Consider need of further workup if symptoms persist  Acute kidney injury superimposed on chronic kidney disease stage IV Patient presents with creatinine of 4.94 with BUN 35. Baseline creatinine previously noted to be around 3.3-3.6.  She is not being followed by nephrology In the outpatient setting.  Patient has been given 1 L normal saline IV fluids.  Worsening kidney function could be secondary to uncontrolled  hypertension. -Check urine sodium and urine creatinine -Continue to monitor kidney function -Will need referral to nephrology in the outpatient setting  Diabetes mellitus type 2, without long-term use of insulin Plan admission glucose 94.  Last available hemoglobin A1c was 6.5 back on 03/2021. -Check hemoglobin A1c  Transitions of care consulted for need of primary care provider  DVT prophylaxis: SCDs Advance Care Planning:   Code Status: Full Code   Consults: None  Family Communication: No family requested to be updated at this time.  Severity of Illness: The appropriate patient status for this patient is INPATIENT. Inpatient status is judged to be reasonable and necessary in order to provide the required intensity of service to ensure the patient's safety. The patient's presenting symptoms, physical exam findings, and initial radiographic and laboratory data in the context of their chronic comorbidities is felt to place them at high risk for further clinical deterioration. Furthermore, it is not anticipated that the patient will be medically stable for discharge from the hospital within 2 midnights of admission.   * I certify that at the point of admission it is my clinical judgment that the patient will require inpatient hospital care spanning beyond 2 midnights from the point of admission due to high intensity of service, high risk for further deterioration and high frequency of surveillance required.*  Author: Clydie Braun, MD 08/11/2022 3:29 PM  For on call review [email or web address].

## 2022-08-11 NOTE — ED Provider Notes (Addendum)
[facility]    CSN: [id number] Arrival date & time: 08/11/22  5643      History   Chief Complaint Chief Complaint  Patient presents with   Chest Pain    HPI [name] is a 42 y.o. female.   HPI Patient with an extensive medical history including type 2 diabetes, CKD 4, anemia due to chronic disease, uncontrolled hypertension presents today for evaluation of left-sided chest pain which has been present since last night.  Patient reports upon awakening today chest pain was a 10 out of 10 reports currently 7 out of 10.  She reports the chest pain has not completely stopped since it started in the middle the night last night.  She did go to work but pain continued to be present in her chest so she came in for evaluation.  Patient reports she has not been taking her blood pressure medications or diabetes medications over a year and she does not have a primary care provider.  Blood pressure on arrival was 196/110.  She denies any associated symptoms of shortness of breath, tooth or jaw pain, or left arm pain.  The chest pain is not reproducible with pressing on her chest.  She endorses that the pain worsens significantly when she is laying on her side or with efforts to lift objects.  Blood glucose checked while here in clinic 155.   Past Medical History:  Diagnosis Date   Anxiety    Asthma    Depression    Diabetes mellitus    Hyperemesis arising during pregnancy    Hypertension    Mental disorder     Patient Active Problem List   Diagnosis Date Noted   Intractable vomiting 04/06/2021   CKD (chronic kidney disease), stage IV (HCC) 04/05/2021   Anemia of chronic disease 04/05/2021   Hypertensive urgency 04/05/2021   History of marijuana use    Hypertension    AKI (acute kidney injury) (HCC) 02/05/2020   Dehydration    Orthostatic hypotension    Abnormal CT of the abdomen    Acute esophagitis    Nausea & vomiting 08/04/2019   Intractable nausea and vomiting  08/03/2019   ARF (acute renal failure) (HCC) 08/24/2018   Uncontrolled type 2 diabetes mellitus with hyperglycemia (HCC) 08/24/2018   DKA (diabetic ketoacidoses) 08/24/2018   Non-intractable vomiting    Sepsis (HCC) 02/15/2016   Generalized abdominal pain 02/15/2016   CAP (community acquired pneumonia) 02/14/2016   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 pylori infection 11/24/2010   Depression 11/20/2010   Asthma 11/20/2010   Chronic hypertension in pregnancy 11/08/2010   Type 2 diabetes mellitus (HCC) 11/07/2010    Past Surgical History:  Procedure Laterality Date   ESOPHAGOGASTRODUODENOSCOPY (EGD) WITH PROPOFOL N/A 08/07/2019   Procedure: ESOPHAGOGASTRODUODENOSCOPY (EGD) WITH PROPOFOL;  Surgeon: Hilarie Fredrickson, MD;  Location: [facility] ENDOSCOPY;  Service: Endoscopy;  Laterality: N/A;   NO PAST SURGERIES     TUBAL LIGATION  06/20/2011   Procedure: POST PARTUM TUBAL LIGATION;  Surgeon: Catalina Antigua, MD;  Location: [facility] ORS;  Service: Gynecology;  Laterality: Bilateral;  Bilateral post partum tubal ligation    OB History     Gravida  4   Para  3   Term  2   Preterm  1   AB  1   Living  3      SAB  0   IAB  0   Ectopic  1  Multiple  0   Live Births  3            Home Medications    Prior to Admission medications   Medication Sig Start Date End Date Taking? Authorizing Provider  amLODipine (NORVASC) 10 MG tablet Take 1 tablet (10 mg total) by mouth daily. 04/07/21   Pokhrel, Rebekah Chesterfield, MD  ferrous sulfate 325 (65 FE) MG tablet Take 1 tablet (325 mg total) by mouth 2 (two) times daily with a meal. 04/07/21   Pokhrel, Laxman, MD  hydrALAZINE (APRESOLINE) 25 MG tablet Take 1 tablet (25 mg total) by mouth 2 (two) times daily. 04/07/21   Pokhrel, Rebekah Chesterfield, MD  insulin aspart (NOVOLOG) 100 UNIT/ML injection Before each meal 3 times a day , 140-199 - 2 units, 200-250 - 4 units, 251-299 - 6 units,  300-349 - 8 units,  350 or above 10 units. Insulin PEN if  approved, provide syringes and needles if needed. Can switch to any other cheaper alternative. 04/07/21   Pokhrel, Rebekah Chesterfield, MD  Insulin Syringe-Needle U-100 25G X 1" 1 ML MISC For 4 times a day insulin SQ, 1 month supply. Diagnosis E11.65 04/07/21   Pokhrel, Rebekah Chesterfield, MD  metoprolol succinate (TOPROL-XL) 25 MG 24 hr tablet Take 1 tablet (25 mg total) by mouth daily. 04/07/21 05/07/21  Pokhrel, Rebekah Chesterfield, MD  ondansetron (ZOFRAN ODT) 4 MG disintegrating tablet Take 1 tablet (4 mg total) by mouth every 8 (eight) hours as needed for nausea or vomiting. 04/07/21   Pokhrel, Rebekah Chesterfield, MD  promethazine (PHENERGAN) 12.5 MG tablet Take 1 tablet (12.5 mg total) by mouth every 6 (six) hours as needed for refractory nausea / vomiting (not helped with zofran). 04/07/21   Pokhrel, Rebekah Chesterfield, MD    Family History Family History  Problem Relation Age of Onset   Heart disease Mother        CHF   Diabetes Sister    Anesthesia problems Neg Hx     Social History Social History   Tobacco Use   Smoking status: Never   Smokeless tobacco: Never  Vaping Use   Vaping status: Never Used  Substance Use Topics   Alcohol use: No    Comment: occassionally   Drug use: No    Types: Cocaine, Marijuana     Allergies   Shellfish allergy and Morphine and codeine   Review of Systems Review of Systems Pertinent negatives listed in HPI   Physical Exam Triage Vital Signs ED Triage Vitals  Encounter Vitals Group     BP      Systolic BP Percentile      Diastolic BP Percentile      Pulse      Resp      Temp      Temp src      SpO2      Weight      Height      Head Circumference      Peak Flow      Pain Score      Pain Loc      Pain Education      Exclude from Growth Chart    No data found.  Updated Vital Signs BP (!) 196/110 (BP Location: Right Arm)   Pulse 89   Temp 98.2 F (36.8 C) (Oral)   Resp 18   SpO2 100%   Visual Acuity Right Eye Distance:   Left Eye Distance:   Bilateral Distance:    Right  Eye Near:   Left Eye  Near:    Bilateral Near:     Physical Exam Vitals reviewed.  HENT:     Head: Normocephalic and atraumatic.  Eyes:     Extraocular Movements: Extraocular movements intact.     Conjunctiva/sclera: Conjunctivae normal.     Pupils: Pupils are equal, round, and reactive to light.  Cardiovascular:     Rate and Rhythm: Normal rate and regular rhythm.     Heart sounds: Murmur heard.  Pulmonary:     Effort: Pulmonary effort is normal.  Chest:     Chest wall: No edema.       Comments: No reproducible chest wall tenderness  Abdominal:     Tenderness: There is no right CVA tenderness or left CVA tenderness.  Skin:    General: Skin is warm and dry.     Capillary Refill: Capillary refill takes less than 2 seconds.  Neurological:     General: No focal deficit present.     Mental Status: She is alert.      UC Treatments / Results  Labs (all labs ordered are listed, but only abnormal results are displayed) Labs Reviewed  POCT FASTING CBG [facility] MANUAL ENTRY - Abnormal; Notable for the following components:      Result Value   POCT Glucose ([facility]) 115 (*)    All other components within normal limits    EKG NSR, with nonspecific ST changes      Radiology No results found.  Procedures Procedures (including critical care time)  Medications Ordered in UC Medications - No data to display  Initial Impression / Assessment and Plan / UC Course  I have reviewed the triage vital signs and the nursing notes.  Pertinent labs & imaging results that were available during my care of the patient were reviewed by me and considered in my medical decision making (see chart for details).    Chest pain, non specific, given patient's comorbidities and noncompliance with chronic medications and known history of CKD 4 with localized chest pain sending to the ED for further workup as she is high risk for cardiovascular event, although some of her symptomology is atypical patient  warrants further workup and evaluation to rule out cardiovascular involvement as a source of her symptoms. Final Clinical Impressions(s) / UC Diagnoses   Final diagnoses:  Chest pain, unspecified type  Accelerated hypertension  Diabetes mellitus due to underlying condition with hyperglycemia, without long-term current use of insulin [facility])     Discharge Instructions      Go to the Emergency Department for further work-up and evaluation of chest pain.      ED Prescriptions   None    PDMP not reviewed this encounter.   Bing Neighbors, NP 08/11/22 1110    Bing Neighbors, NP 08/11/22 [phone]

## 2022-08-11 NOTE — ED Triage Notes (Signed)
Pt c/o lt sided chest pain on movement since last night. States pain worse this am. Denies taking meds for pain. Denies any other sx's. Denies injury or heavy lifting.

## 2022-08-11 NOTE — ED Triage Notes (Signed)
Pt to ED from UC c/o left sided chest pain since last night. Pt denies SHOB denies N/V. No s/s of acute distress noted in triage.

## 2022-08-11 NOTE — ED Provider Notes (Signed)
   Accepted handoff at shift change from [facility]. Please see prior provider note for more detail.   Briefly: Patient is 42 y.o. "left-sided chest pain that started last night she describes as feeling like someone punched her in the chest. She reports 10/10 chest pain, she denies any shortness of breath, denies nausea or vomiting. She reports nothing seems to make the chest pain better or worse. Patient reports that she still has her menses. She reports that she has not been in contact with anyone regarding her chronic kidney disease, and has not spoken to anyone about beginning dialysis at some point in the future. She reports that she has not been taking her blood pressure medication for at least 6 months. Taking her insulin and iron supplementation however the. She denies abnormally heavy menses. She denies any hematemesis, hematochezia, or melena "   DDX: concern for  ACS, AAS, PE, Mallory-Weiss, Boerhaave's, Pneumonia, acute bronchitis, asthma or COPD exacerbation, anxiety, MSK pain or traumatic injury to the chest, acid reflux versus other .   Plan:  - PMHx DM, CKD stage 4, anemia d/t chronic disease, HTN. Presents to ED concerned for severe constant chest pain since last night. Noncompliant on home medications. - Lab workup showing AKI, anemia (6.6), and CT showing PNA. Started patient on fluids and ABX. -Dr. Katrinka Blazing admitting provider.      Dorthy Cooler, [place] 08/11/22 1537    Margarita Grizzle, MD 08/12/22 [phone]

## 2022-08-11 NOTE — Discharge Instructions (Signed)
Go to the Emergency Department for further work-up and evaluation of chest pain.

## 2022-08-12 ENCOUNTER — Inpatient Hospital Stay (HOSPITAL_COMMUNITY): Payer: Medicaid Other

## 2022-08-12 DIAGNOSIS — D649 Anemia, unspecified: Secondary | ICD-10-CM

## 2022-08-12 DIAGNOSIS — J189 Pneumonia, unspecified organism: Secondary | ICD-10-CM | POA: Diagnosis not present

## 2022-08-12 LAB — CBC
HCT: 23 % — ABNORMAL LOW (ref 36.0–46.0)
Hemoglobin: 7.1 g/dL — ABNORMAL LOW (ref 12.0–15.0)
MCH: 22.3 pg — ABNORMAL LOW (ref 26.0–34.0)
MCHC: 30.9 g/dL (ref 30.0–36.0)
MCV: 72.3 fL — ABNORMAL LOW (ref 80.0–100.0)
RBC: 3.18 MIL/uL — ABNORMAL LOW (ref 3.87–5.11)
RDW: 21.2 % — ABNORMAL HIGH (ref 11.5–15.5)
WBC: 6.8 10*3/uL (ref 4.0–10.5)
nRBC: 0 % (ref 0.0–0.2)

## 2022-08-12 LAB — TYPE AND SCREEN

## 2022-08-12 LAB — COMPREHENSIVE METABOLIC PANEL
ALT: 10 U/L (ref 0–44)
AST: 11 U/L — ABNORMAL LOW (ref 15–41)
Albumin: 2.7 g/dL — ABNORMAL LOW (ref 3.5–5.0)
Anion gap: 10 (ref 5–15)
BUN: 36 mg/dL — ABNORMAL HIGH (ref 6–20)
CO2: 18 mmol/L — ABNORMAL LOW (ref 22–32)
Calcium: 6.6 mg/dL — ABNORMAL LOW (ref 8.9–10.3)
Chloride: 105 mmol/L (ref 98–111)
Creatinine, Ser: 5.02 mg/dL — ABNORMAL HIGH (ref 0.44–1.00)
Glucose, Bld: 108 mg/dL — ABNORMAL HIGH (ref 70–99)
Potassium: 4.7 mmol/L (ref 3.5–5.1)
Total Bilirubin: 0.4 mg/dL (ref 0.3–1.2)
Total Protein: 6.4 g/dL — ABNORMAL LOW (ref 6.5–8.1)

## 2022-08-12 LAB — BPAM RBC
Blood Product Expiration Date: 202408192359
Unit Type and Rh: 6200

## 2022-08-12 MED ORDER — HYDROXYZINE HCL 10 MG PO TABS: 10.0000 mg | ORAL_TABLET | Freq: Three times a day (TID) | ORAL | Status: DC | PRN

## 2022-08-12 MED ORDER — PROCHLORPERAZINE EDISYLATE 10 MG/2ML IJ SOLN
5.0000 mg | Freq: Four times a day (QID) | INTRAMUSCULAR | Status: DC | PRN
  Administered 2022-08-12: 5 mg via INTRAVENOUS
  Filled 2022-08-12: qty 2

## 2022-08-12 MED ORDER — PANTOPRAZOLE SODIUM 40 MG IV SOLR
40.0000 mg | INTRAVENOUS | Status: DC
  Administered 2022-08-12: 40 mg via INTRAVENOUS
  Filled 2022-08-12: qty 10

## 2022-08-12 MED ORDER — SODIUM CHLORIDE 0.9 % IV SOLN: INTRAVENOUS | Status: DC

## 2022-08-12 MED ORDER — LORAZEPAM 2 MG/ML IJ SOLN
0.5000 mg | Freq: Four times a day (QID) | INTRAMUSCULAR | Status: DC | PRN
  Administered 2022-08-12 – 2022-08-13 (×3): 0.5 mg via INTRAVENOUS
  Filled 2022-08-12 (×3): qty 1

## 2022-08-12 MED ORDER — SODIUM CHLORIDE 0.9 % IV SOLN
250.0000 mg | Freq: Once | INTRAVENOUS | Status: AC
  Administered 2022-08-12: 250 mg via INTRAVENOUS
  Filled 2022-08-12: qty 20

## 2022-08-12 MED ORDER — METOPROLOL SUCCINATE ER 25 MG PO TB24
25.0000 mg | ORAL_TABLET | Freq: Every day | ORAL | Status: DC
  Administered 2022-08-12: 25 mg via ORAL
  Filled 2022-08-12: qty 1

## 2022-08-12 MED ORDER — METOCLOPRAMIDE HCL 5 MG/ML IJ SOLN
5.0000 mg | Freq: Four times a day (QID) | INTRAMUSCULAR | Status: DC
  Administered 2022-08-12: 5 mg via INTRAVENOUS
  Filled 2022-08-12: qty 2

## 2022-08-12 MED ORDER — LABETALOL HCL 5 MG/ML IV SOLN: 5.0000 mg | INTRAVENOUS | Status: DC | PRN

## 2022-08-12 MED ORDER — PROCHLORPERAZINE EDISYLATE 10 MG/2ML IJ SOLN: 5.0000 mg | Freq: Four times a day (QID) | INTRAMUSCULAR | Status: DC | PRN

## 2022-08-12 MED ORDER — HYDRALAZINE HCL 25 MG PO TABS
25.0000 mg | ORAL_TABLET | Freq: Two times a day (BID) | ORAL | Status: DC
  Administered 2022-08-12 – 2022-08-13 (×3): 25 mg via ORAL
  Filled 2022-08-12 (×3): qty 1

## 2022-08-12 NOTE — Progress Notes (Signed)
MEWS Progress Note  Patient Details Name: Bodhi Stenglein MRN: [id number] DOB: 1980/01/29 Today's Date: 08/12/2022   MEWS Flowsheet Documentation:  Assess: MEWS Score Temp: 98.1 F (36.7 C) BP: (!) 202/86 MAP (mmHg): 121 Pulse Rate: 92 ECG Heart Rate: (!) 113 Resp: 18 Level of Consciousness: Alert SpO2: 100 % O2 Device: Room Air Assess: MEWS Score MEWS Temp: 0 MEWS Systolic: 2 MEWS Pulse: 0 MEWS RR: 0 MEWS LOC: 0 MEWS Score: 2 MEWS Score Color: Yellow Assess: SIRS CRITERIA SIRS Temperature : 0 SIRS Respirations : 0 SIRS Pulse: 1 SIRS WBC: 0 SIRS Score Sum : 1 SIRS Temperature : 0 SIRS Pulse: 1 SIRS Respirations : 0 SIRS WBC: 0 SIRS Score Sum : 1 Assess: if the MEWS score is Yellow or Red Were vital signs accurate and taken at a resting state?: Yes Does the patient meet 2 or more of the SIRS criteria?: No MEWS guidelines implemented : Yes, yellow Treat MEWS Interventions: Considered administering scheduled or prn medications/treatments as ordered Take Vital Signs Increase Vital Sign Frequency : Yellow: Q2hr x1, continue Q4hrs until patient remains green for 12hrs Escalate MEWS: Escalate: Yellow: Discuss with charge nurse and consider notifying provider and/or RRT Notify: Charge Nurse/RN Name of Charge Nurse/RN Notified: Maryln Manuel, RN      Denton Meek 08/12/2022, 6:21 AM

## 2022-08-12 NOTE — Progress Notes (Signed)
PROGRESS NOTE    [name]  ZOX:[id number] DOB: 12-12-1980 DOA: 08/11/2022 PCP: Default, Provider, MD    Brief Narrative:  [name] is a 42 y.o. female with medical history significant of hypertension, diabetes mellitus type 2, CKD stage IV, anemia, anxiety, depression who presents with left-sided chest pain that started yesterday evening while she was sitting watching television.  She stated that it felt like a someone was punching her chest.  Pain was a 10 out of 10 at its worst.  She did not take anything for the symptoms last night and went to sleep.  However this morning pain was still present.  She did have an episode of nausea and vomiting.    Assessment and Plan: Suspected community-acquired pneumonia Patient presents with complaints of left-sided chest pain that started yesterday evening.  CT scan of the chest with contrast noted focal nodular density within the superior segment of the left lower lobe measuring 2.6 x 1.7 x 1.9 cm with air bronchogram concerning for pneumonia, but malignancy could not be ruled out.  Patient been started on empiric antibiotics of Rocephin and azithromycin. -will need outpatient x ray in 4-6 weeks to determine resolution   N/V with history of marijuana hyperemesis -scheduled reglan -marijuana cessation -prn ativan  Anemia of chronic disease Acute on chronic.  On admission hemoglobin noted to be as low as 6.6 with low MCV and MCH.  Last hemoglobin was 8.6 back in 03/2021 patient has been typed and screened and ordered to be transfused 1 unit of PRBCs.  Denies any complaints of bleeding or blood in stools. -IV Fe -heme negative   Hypertensive urgency On admission blood pressure elevated up to 223/94.  Patient had not been on any blood pressure medications. -resume home meds -IV PRNS   Acute kidney injury superimposed on chronic kidney disease stage IV? Patient presents with creatinine of 4.94 with BUN 35. Baseline creatinine previously noted  to be around 3.3-3.6.  She is not being followed by nephrology In the outpatient setting.  Patient has been given 1 L normal saline IV fluids.  Worsening kidney function could be secondary to uncontrolled hypertension. -Continue to monitor kidney function -renal U/S -Will need referral to nephrology in the outpatient setting vs inpatient   Diabetes mellitus type 2, without long-term use of insulin - hemoglobin A1c: 6.1   DVT prophylaxis: SCDs Start: 08/11/22 1542    Code Status: Full Code Family Communication: at bedside  Disposition Plan:  Level of care: Telemetry Medical Status is: Inpatient Remains inpatient appropriate     Consultants:  none   Subjective: C/o nausea vomiting and severe anxiety  Objective: Vitals:   08/12/22 0524 08/12/22 0653 08/12/22 0819 08/12/22 0900  BP: (!) 202/86 (!) 187/88 (!) 212/102   Pulse: 92 93 98   Resp: 18     Temp: 98.1 F (36.7 C)   98.4 F (36.9 C)  TempSrc:    Oral  SpO2: 100% 98% 100%   Weight:      Height:        Intake/Output Summary (Last 24 hours) at 08/12/2022 1111 Last data filed at 08/12/2022 0913 Gross per 24 hour  Intake 350 ml  Output 0 ml  Net 350 ml   Filed Weights   08/11/22 1109 08/11/22 1729  Weight: 81.6 kg 81.1 kg    Examination:   General: Appearance:     Overweight female who is tearful in bed     Lungs:      respirations  unlabored  Heart:    Normal heart rate.    MS:   All extremities are intact.    Neurologic:   Awake, alert,       Data Reviewed: I have personally reviewed following labs and imaging studies  CBC: Recent Labs  Lab 08/11/22 1153 08/12/22 0227  WBC 7.3 6.8  HGB 6.6* 7.1*  HCT 22.0* 23.0*  MCV 71.0* 72.3*  PLT 247 190   Basic Metabolic Panel: Recent Labs  Lab 08/11/22 1153 08/12/22 0227  NA 135 133*  K 4.8 4.7  CL 106 105  CO2 19* 18*  GLUCOSE 94 108*  BUN 35* 36*  CREATININE 4.94* 5.02*  CALCIUM 7.5* 6.6*   GFR: Estimated Creatinine Clearance:  15.7 mL/min (A) (by C-G formula based on SCr of 5.02 mg/dL (H)). Liver Function Tests: Recent Labs  Lab 08/12/22 0227  AST 11*  ALT 10  ALKPHOS 70  BILITOT 0.4  PROT 6.4*  ALBUMIN 2.7*   No results for input(s): "LIPASE", "AMYLASE" in the last 168 hours. No results for input(s): "AMMONIA" in the last 168 hours. Coagulation Profile: No results for input(s): "INR", "PROTIME" in the last 168 hours. Cardiac Enzymes: No results for input(s): "CKTOTAL", "CKMB", "CKMBINDEX", "TROPONINI" in the last 168 hours. BNP (last 3 results) No results for input(s): "PROBNP" in the last 8760 hours. HbA1C: Recent Labs    08/11/22 1815  HGBA1C 6.1*   CBG: No results for input(s): "GLUCAP" in the last 168 hours. Lipid Profile: No results for input(s): "CHOL", "HDL", "LDLCALC", "TRIG", "CHOLHDL", "LDLDIRECT" in the last 72 hours. Thyroid Function Tests: No results for input(s): "TSH", "T4TOTAL", "FREET4", "T3FREE", "THYROIDAB" in the last 72 hours. Anemia Panel: Recent Labs    08/11/22 1815  TIBC 400  IRON 27*   Sepsis Labs: Recent Labs  Lab 08/11/22 1815  PROCALCITON <0.10    No results found for this or any previous visit (from the past 240 hour(s)).       Radiology Studies: CT Chest Wo Contrast  Result Date: 08/11/2022 CLINICAL DATA:  Chest pain.  Abnormal chest x-ray EXAM: CT CHEST WITHOUT CONTRAST TECHNIQUE: Multidetector CT imaging of the chest was performed following the standard protocol without IV contrast. RADIATION DOSE REDUCTION: This exam was performed according to the departmental dose-optimization program which includes automated exposure control, adjustment of the mA and/or kV according to patient size and/or use of iterative reconstruction technique. COMPARISON:  X-ray 08/11/2022.  CT 09/04/2010 FINDINGS: Cardiovascular: Normal heart size. Small-moderate-sized pericardial effusion. Thoracic aorta is nonaneurysmal. Coronary artery atherosclerosis. Central pulmonary  vasculature is nondilated. Mediastinum/Nodes: No axillary or mediastinal lymphadenopathy. Evaluation of the hilar structures is limited in the absence of intravenous contrast. Within this limitation, no obvious hilar adenopathy or mass is identified. Thyroid gland, trachea, and esophagus demonstrate no significant abnormality. Lungs/Pleura: Focal nodular density within the superior segment of the left lower lobe measuring 2.6 x 1.7 x 1.9 cm (series 4, image 44). This density demonstrates irregular margins and abuts the fissure. Air bronchogram is present within the inferior aspect of the density. The remaining lung fields are otherwise clear. No additional pulmonary nodules or airspace consolidations. No pleural effusion or pneumothorax. Upper Abdomen: No acute abnormality. Musculoskeletal: No chest wall mass or suspicious bone lesions identified. IMPRESSION: 1. Focal nodular density within the superior segment of the left lower lobe measuring 2.6 x 1.7 x 1.9 cm. The presence of air bronchograms favors this to be a focal pneumonia. Underlying lung malignancy is difficult to exclude. Given  that this finding is visible radiographically, recommend follow-up chest x-rays in 6-8 weeks after appropriate treatment to ensure resolution. If findings do not resolve, a repeat chest CT should be performed at that time. 2. Small-moderate-sized pericardial effusion. 3. Coronary artery atherosclerosis. Electronically Signed   By: Duanne Guess D.O.   On: 08/11/2022 15:00   DG Chest 2 View  Result Date: 08/11/2022 CLINICAL DATA:  Chest pain. EXAM: CHEST - 2 VIEW COMPARISON:  August 02, 2019. FINDINGS: The heart size and mediastinal contours are within normal limits. Small calcified granuloma is again noted in right upper lobe. Interval development of small irregular nodular density seen in left upper lobe. The visualized skeletal structures are unremarkable. IMPRESSION: Interval development of small regular nodular density  seen in left upper lobe. While this may represent focal inflammation, neoplasm cannot be excluded. CT scan of the chest is recommended for further evaluation. Electronically Signed   By: Lupita Raider M.D.   On: 08/11/2022 12:44        Scheduled Meds:  amLODipine  10 mg Oral Daily   guaiFENesin  600 mg Oral BID   hydrALAZINE  25 mg Oral BID   metoCLOPramide (REGLAN) injection  5 mg Intravenous Q6H   metoprolol succinate  25 mg Oral Daily   sodium chloride flush  3 mL Intravenous Q12H   Continuous Infusions:  sodium chloride 100 mL/hr at 08/12/22 0949   azithromycin 500 mg (08/12/22 0946)   cefTRIAXone (ROCEPHIN)  IV 2 g (08/12/22 0823)   ferric gluconate (FERRLECIT) IVPB       LOS: 1 day    Time spent: 45 minutes spent on chart review, discussion with nursing staff, consultants, updating family and interview/physical exam; more than 50% of that time was spent in counseling and/or coordination of care.    Joseph Art, DO [facility] Available via Epic secure chat 7am-7pm After these hours, please refer to coverage provider listed on [email or web address] 08/12/2022, 11:11 AM

## 2022-08-13 ENCOUNTER — Other Ambulatory Visit (HOSPITAL_COMMUNITY): Payer: Self-pay

## 2022-08-13 DIAGNOSIS — N184 Chronic kidney disease, stage 4 (severe): Secondary | ICD-10-CM

## 2022-08-13 DIAGNOSIS — D649 Anemia, unspecified: Secondary | ICD-10-CM | POA: Diagnosis not present

## 2022-08-13 DIAGNOSIS — J189 Pneumonia, unspecified organism: Secondary | ICD-10-CM | POA: Diagnosis not present

## 2022-08-13 MED ORDER — METOPROLOL SUCCINATE ER 50 MG PO TB24
50.0000 mg | ORAL_TABLET | Freq: Every day | ORAL | 0 refills | Status: DC
  Filled 2022-08-13: qty 90, 90d supply, fill #0

## 2022-08-13 MED ORDER — PANTOPRAZOLE SODIUM 40 MG PO TBEC
40.0000 mg | DELAYED_RELEASE_TABLET | Freq: Every day | ORAL | Status: DC
  Administered 2022-08-13: 40 mg via ORAL
  Filled 2022-08-13: qty 1

## 2022-08-13 MED ORDER — AZITHROMYCIN 500 MG PO TABS
500.0000 mg | ORAL_TABLET | Freq: Every day | ORAL | Status: DC
  Administered 2022-08-13: 500 mg via ORAL
  Filled 2022-08-13: qty 1

## 2022-08-13 MED ORDER — HYDRALAZINE HCL 25 MG PO TABS
25.0000 mg | ORAL_TABLET | Freq: Three times a day (TID) | ORAL | 0 refills | Status: DC
  Filled 2022-08-13: qty 270, 90d supply, fill #0

## 2022-08-13 MED ORDER — METOCLOPRAMIDE HCL 5 MG PO TABS
5.0000 mg | ORAL_TABLET | Freq: Three times a day (TID) | ORAL | 0 refills | Status: DC
  Filled 2022-08-13: qty 42, 14d supply, fill #0

## 2022-08-13 MED ORDER — METOPROLOL SUCCINATE ER 50 MG PO TB24
50.0000 mg | ORAL_TABLET | Freq: Every day | ORAL | Status: DC
  Administered 2022-08-13: 50 mg via ORAL
  Filled 2022-08-13: qty 1

## 2022-08-13 MED ORDER — PANTOPRAZOLE SODIUM 40 MG PO TBEC
40.0000 mg | DELAYED_RELEASE_TABLET | Freq: Every day | ORAL | 0 refills | Status: DC
  Filled 2022-08-13: qty 90, 90d supply, fill #0

## 2022-08-13 MED ORDER — AMLODIPINE BESYLATE 10 MG PO TABS
10.0000 mg | ORAL_TABLET | Freq: Every day | ORAL | 0 refills | Status: DC
  Filled 2022-08-13: qty 90, 90d supply, fill #0

## 2022-08-13 MED ORDER — HYDRALAZINE HCL 25 MG PO TABS
25.0000 mg | ORAL_TABLET | Freq: Three times a day (TID) | ORAL | Status: DC
  Administered 2022-08-13: 25 mg via ORAL
  Filled 2022-08-13: qty 1

## 2022-08-13 MED ORDER — DOXYCYCLINE HYCLATE 100 MG PO TABS
100.0000 mg | ORAL_TABLET | Freq: Two times a day (BID) | ORAL | 0 refills | Status: DC
  Filled 2022-08-13: qty 12, 6d supply, fill #0

## 2022-08-13 NOTE — Progress Notes (Signed)
Ambulated in room on room air 100% oxygen sats

## 2022-08-13 NOTE — Discharge Summary (Signed)
Physician Discharge Summary  [name] PIR:[id number] DOB: 07-05-1980 DOA: 08/11/2022  PCP: Default, Provider, MD  Admit date: 08/11/2022 Discharge date: 08/13/2022  Admitted From: home Discharge disposition: home   Recommendations for Outpatient Follow-Up:   will need outpatient x ray in 4-6 weeks to determine resolution  BMP, CBC at next office visit Referral placed to nephrology- suspect will need epogen  Marijuana cessation   Discharge Diagnosis:   Active Problems:   CAP (community acquired pneumonia)   Anemia of chronic disease   Hypertensive urgency   Elevated troponin   Acute kidney injury superimposed on chronic kidney disease (HCC)   Type 2 diabetes mellitus (HCC)    Discharge Condition: Improved.  Diet recommendation: Low sodium, heart healthy.  Carbohydrate-modified.  Wound care: None.  Code status: Full.   History of Present Illness:   [name] is a 42 y.o. female with medical history significant of hypertension, diabetes mellitus type 2, CKD stage IV, anemia, anxiety, depression who presents with left-sided chest pain that started yesterday evening while she was sitting watching television.  She stated that it felt like a someone was punching her chest.  Pain was a 10 out of 10 at its worst.  She did not take anything for the symptoms last night and went to sleep.  However this morning pain was still present.  She did have an episode of nausea and vomiting.    Hospital Course by Problem:   Suspected community-acquired pneumonia Patient presents with complaints of left-sided chest pain that started yesterday evening.  CT scan of the chest with contrast noted focal nodular density within the superior segment of the left lower lobe measuring 2.6 x 1.7 x 1.9 cm with air bronchogram concerning for pneumonia, but malignancy could not be ruled out.  Patient been started on empiric antibiotics of Rocephin and azithromycin-- change to PO abx. -will  need outpatient x ray in 4-6 weeks to determine resolution   N/V with history of marijuana hyperemesis -reglan -marijuana cessation   Anemia of chronic disease Acute on chronic.  On admission hemoglobin noted to be as low as 6.6 with low MCV and MCH.  Last hemoglobin was 8.6 back in 03/2021 patient has been typed and screened and ordered to be transfused 1 unit of PRBCs.  Denies any complaints of bleeding or blood in stools. -IV Fe  -heme negative   Hypertensive urgency On admission blood pressure elevated up to 223/94.  Patient had not been on any blood pressure medications. -resume meds--- titrate by PCP   Acute kidney injury superimposed on chronic kidney disease stage IV? Patient presents with creatinine of 4.94 with BUN 35. Baseline creatinine previously noted to be around 3.3-3.6.  She is not being followed by nephrology In the outpatient setting. -needs better BP control -renal U/S- no obstruction -Will need referral to nephrology in the outpatient setting    Diabetes mellitus type 2, without long-term use of insulin - hemoglobin A1c: 6.1 -outpatient follow up    Medical Consultants:      Discharge Exam:   Vitals:   08/13/22 0826 08/13/22 1145  BP: (!) 175/82 (!) 142/73  Pulse: 91 98  Resp: 16   Temp: 98.1 F (36.7 C) 97.8 F (36.6 C)  SpO2: 100% 100%   Vitals:   08/12/22 2029 08/13/22 0500 08/13/22 0826 08/13/22 1145  BP: (!) 153/72 (!) 152/71 (!) 175/82 (!) 142/73  Pulse: 88 77 91 98  Resp: 20  16   Temp:  98.2 F (36.8 C) 98.3 F (36.8 C) 98.1 F (36.7 C) 97.8 F (36.6 C)  TempSrc: Oral Oral  Oral  SpO2: 97% 100% 100% 100%  Weight:  81.7 kg    Height:        General exam: Appears calm and comfortable-- wants to go home   The results of significant diagnostics from this hospitalization (including imaging, microbiology, ancillary and laboratory) are listed below for reference.     Procedures and Diagnostic Studies:   US RENAL  Result Date:  08/12/2022 CLINICAL DATA:  Acute kidney insufficiency EXAM: RENAL / URINARY TRACT ULTRASOUND COMPLETE COMPARISON:  CT 09/23/2020.  Ultrasound 02/06/2020 FINDINGS: Right Kidney: Renal measurements: 8.6 x 4.5 x 4.3 cm = volume: 87.5 mL. No collecting system dilatation. The kidney is echogenic. Anechoic structure of towards the upper pole measures 10 mm consistent with a benign cysts. Left Kidney: Renal measurements: 8.4 x 5.1 x 5.0 cm = volume: 111.8 mL. Diffusely increased echogenic parenchyma. No collecting system dilatation. Bladder: Distended urinary bladder. Other: None. IMPRESSION: No collecting system dilatation.  Echogenic appearing kidneys. Electronically Signed   By: Karen Kays M.D.   On: 08/12/2022 15:03   CT Chest Wo Contrast  Result Date: 08/11/2022 CLINICAL DATA:  Chest pain.  Abnormal chest x-ray EXAM: CT CHEST WITHOUT CONTRAST TECHNIQUE: Multidetector CT imaging of the chest was performed following the standard protocol without IV contrast. RADIATION DOSE REDUCTION: This exam was performed according to the departmental dose-optimization program which includes automated exposure control, adjustment of the mA and/or kV according to patient size and/or use of iterative reconstruction technique. COMPARISON:  X-ray 08/11/2022.  CT 09/04/2010 FINDINGS: Cardiovascular: Normal heart size. Small-moderate-sized pericardial effusion. Thoracic aorta is nonaneurysmal. Coronary artery atherosclerosis. Central pulmonary vasculature is nondilated. Mediastinum/Nodes: No axillary or mediastinal lymphadenopathy. Evaluation of the hilar structures is limited in the absence of intravenous contrast. Within this limitation, no obvious hilar adenopathy or mass is identified. Thyroid gland, trachea, and esophagus demonstrate no significant abnormality. Lungs/Pleura: Focal nodular density within the superior segment of the left lower lobe measuring 2.6 x 1.7 x 1.9 cm (series 4, image 44). This density demonstrates  irregular margins and abuts the fissure. Air bronchogram is present within the inferior aspect of the density. The remaining lung fields are otherwise clear. No additional pulmonary nodules or airspace consolidations. No pleural effusion or pneumothorax. Upper Abdomen: No acute abnormality. Musculoskeletal: No chest wall mass or suspicious bone lesions identified. IMPRESSION: 1. Focal nodular density within the superior segment of the left lower lobe measuring 2.6 x 1.7 x 1.9 cm. The presence of air bronchograms favors this to be a focal pneumonia. Underlying lung malignancy is difficult to exclude. Given that this finding is visible radiographically, recommend follow-up chest x-rays in 6-8 weeks after appropriate treatment to ensure resolution. If findings do not resolve, a repeat chest CT should be performed at that time. 2. Small-moderate-sized pericardial effusion. 3. Coronary artery atherosclerosis. Electronically Signed   By: Duanne Guess D.O.   On: 08/11/2022 15:00   DG Chest 2 View  Result Date: 08/11/2022 CLINICAL DATA:  Chest pain. EXAM: CHEST - 2 VIEW COMPARISON:  August 02, 2019. FINDINGS: The heart size and mediastinal contours are within normal limits. Small calcified granuloma is again noted in right upper lobe. Interval development of small irregular nodular density seen in left upper lobe. The visualized skeletal structures are unremarkable. IMPRESSION: Interval development of small regular nodular density seen in left upper lobe. While this may represent focal inflammation,  neoplasm cannot be excluded. CT scan of the chest is recommended for further evaluation. Electronically Signed   By: Lupita Raider M.D.   On: 08/11/2022 12:44     Labs:   Basic Metabolic Panel: Recent Labs  Lab 08/11/22 1153 08/12/22 0227 08/13/22 0251  NA 135 133* 135  K 4.8 4.7 3.9  CL 106 105 108  CO2 19* 18* 16*  GLUCOSE 94 108* 107*  BUN 35* 36* 31*  CREATININE 4.94* 5.02* 4.75*  CALCIUM 7.5* 6.6*  7.1*   GFR Estimated Creatinine Clearance: 16.6 mL/min (A) (by C-G formula based on SCr of 4.75 mg/dL (H)). Liver Function Tests: Recent Labs  Lab 08/12/22 0227  AST 11*  ALT 10  ALKPHOS 70  BILITOT 0.4  PROT 6.4*  ALBUMIN 2.7*   No results for input(s): "LIPASE", "AMYLASE" in the last 168 hours. No results for input(s): "AMMONIA" in the last 168 hours. Coagulation profile No results for input(s): "INR", "PROTIME" in the last 168 hours.  CBC: Recent Labs  Lab 08/11/22 1153 08/12/22 0227 08/13/22 0251  WBC 7.3 6.8 6.6  HGB 6.6* 7.1* 7.9*  HCT 22.0* 23.0* 25.6*  MCV 71.0* 72.3* 73.1*  PLT 247 190 212   Cardiac Enzymes: No results for input(s): "CKTOTAL", "CKMB", "CKMBINDEX", "TROPONINI" in the last 168 hours. BNP: Invalid input(s): "POCBNP" CBG: No results for input(s): "GLUCAP" in the last 168 hours. D-Dimer No results for input(s): "DDIMER" in the last 72 hours. Hgb A1c Recent Labs    08/11/22 1815  HGBA1C 6.1*   Lipid Profile No results for input(s): "CHOL", "HDL", "LDLCALC", "TRIG", "CHOLHDL", "LDLDIRECT" in the last 72 hours. Thyroid function studies No results for input(s): "TSH", "T4TOTAL", "T3FREE", "THYROIDAB" in the last 72 hours.  Invalid input(s): "FREET3" Anemia work up Recent Labs    08/11/22 1815  TIBC 400  IRON 27*   Microbiology No results found for this or any previous visit (from the past 240 hour(s)).   Discharge Instructions:   Discharge Instructions     Ambulatory referral to Nephrology   Complete by: As directed    Diet - low sodium heart healthy   Complete by: As directed    Diet Carb Modified   Complete by: As directed    Discharge instructions   Complete by: As directed    Soft diet Stop marijuana use Will need chest x ray in 4-6 weeks to ensure resolution of pneumonia BMP at next office visit   Increase activity slowly   Complete by: As directed       Allergies as of 08/13/2022       Reactions   Shellfish  Allergy Anaphylaxis, Swelling   Morphine And Codeine Nausea And Vomiting        Medication List     STOP taking these medications    Insulin Syringe-Needle U-100 25G X 1" 1 ML Misc   NovoLOG 100 UNIT/ML injection Generic drug: insulin aspart   ondansetron 4 MG disintegrating tablet Commonly known as: Zofran ODT   promethazine 12.5 MG tablet Commonly known as: PHENERGAN       TAKE these medications    acetaminophen 500 MG tablet Commonly known as: TYLENOL Take 1,000 mg by mouth every 6 (six) hours as needed for moderate pain.   amLODipine 10 MG tablet Commonly known as: NORVASC Take 1 tablet (10 mg total) by mouth daily.   doxycycline 100 MG tablet Commonly known as: VIBRA-TABS Take 1 tablet (100 mg total) by mouth 2 (two) times  daily.   ferrous sulfate 325 (65 FE) MG tablet Take 1 tablet (325 mg total) by mouth 2 (two) times daily with a meal.   hydrALAZINE 25 MG tablet Commonly known as: APRESOLINE Take 1 tablet (25 mg total) by mouth every 8 (eight) hours. What changed: when to take this   metoCLOPramide 5 MG tablet Commonly known as: REGLAN Take 1 tablet (5 mg total) by mouth 3 (three) times daily with meals for 14 days.   metoprolol succinate 50 MG 24 hr tablet Commonly known as: TOPROL-XL Take 1 tablet (50 mg total) by mouth daily. Take with or immediately following a meal. Start taking on: August 14, 2022 What changed:  medication strength how much to take additional instructions   pantoprazole 40 MG tablet Commonly known as: PROTONIX Take 1 tablet (40 mg total) by mouth daily. Start taking on: August 14, 2022          Time coordinating discharge: 45 min  Signed:  Joseph Art DO  [facility] 08/13/2022, 1:51 PM

## 2022-08-13 NOTE — Progress Notes (Signed)
DISCHARGE NOTE HOME [name] to be discharged Home per MD order. Discussed prescriptions and follow up appointments with the patient. Prescriptions given to patient; medication list explained in detail. Patient verbalized understanding. Pt has meds from transition of care pharmacy with her.  Skin clean, dry and intact without evidence of skin break down, no evidence of skin tears noted. IV catheter discontinued intact. Site without signs and symptoms of complications. Dressing and pressure applied. Pt denies pain at the site currently. No complaints noted.  Patient free of lines, drains, and wounds.   An After Visit Summary (AVS) was printed and given to the patient. Patient escorted via wheelchair, and discharged home via private auto.  Irwin Brakeman, RN

## 2022-08-13 NOTE — TOC Transition Note (Signed)
Transition of Care [facility]) - CM/SW Discharge Note   Patient Details  Name: [name] MRN: [id number] Date of Birth: 1980-09-26  [facility]) CM/SW Contact:  Tom-Johnson, Hershal Coria, RN Phone Number: 08/13/2022, 1:22 PM   Clinical Narrative:     Patient is scheduled for discharge today.  Readmission Risk Assessment done. New patient establishment, hospital f/u and discharge instructions on AVS. Prescriptions sent to [facility] pharmacy and meds will be delivered to patient at bedside prior discharge. Patient drove self to the ED and will  transport self at discharge.  No further TOC needs noted.      Final next level of care: Home/Self Care Barriers to Discharge: Barriers Resolved   Patient Goals and CMS Choice CMS Medicare.gov Compare Post Acute Care list provided to:: Patient Choice offered to / list presented to : Patient  Discharge Placement                  Patient to be transferred to facility by: Self      Discharge Plan and Services Additional resources added to the After Visit Summary for                  DME Arranged: N/A DME Agency: NA         HH Agency: NA        Social Determinants of Health (SDOH) Interventions SDOH Screenings   Food Insecurity: No Food Insecurity (08/11/2022)  Housing: Low Risk  (08/11/2022)  Transportation Needs: No Transportation Needs (08/11/2022)  Utilities: Not At Risk (08/11/2022)  Tobacco Use: Low Risk  (08/11/2022)     Readmission Risk Interventions    08/13/2022    1:20 PM 04/07/2021    2:50 PM  Readmission Risk Prevention Plan  Transportation Screening Complete Complete  PCP or Specialist Appt within 5-7 Days Complete   Home Care Screening Complete Complete  Medication Review (RN CM) Referral to Pharmacy Complete

## 2022-08-13 NOTE — Progress Notes (Signed)
PHARMACIST - PHYSICIAN COMMUNICATION  DR:   Benjamine Mola  CONCERNING: IV to Oral Route Change Policy  RECOMMENDATION: This patient is receiving azith/protonix by the intravenous route.  Based on criteria approved by the Pharmacy and Therapeutics Committee, the intravenous medication(s) is/are being converted to the equivalent oral dose form(s).   DESCRIPTION: These criteria include: The patient is eating (either orally or via tube) and/or has been taking other orally administered medications for a least 24 hours The patient has no evidence of active gastrointestinal bleeding or impaired GI absorption (gastrectomy, short bowel, patient on TNA or NPO).  If you have questions about this conversion, please contact the Pharmacy Department  []   [phone] )  [facility] []   [phone] )  [facility] [x]   [phone] )  [facility] []   [phone] )  [facility] []   [phone] )  [facility]

## 2022-08-13 NOTE — Plan of Care (Signed)
  Problem: Education: Goal: Knowledge of General Education information will improve Description: Including pain rating scale, medication(s)/side effects and non-pharmacologic comfort measures 08/13/2022 1451 by Irwin Brakeman, RN Outcome: Adequate for Discharge 08/13/2022 1451 by Irwin Brakeman, RN Outcome: Adequate for Discharge   Problem: Health Behavior/Discharge Planning: Goal: Ability to manage health-related needs will improve 08/13/2022 1451 by Irwin Brakeman, RN Outcome: Adequate for Discharge 08/13/2022 1451 by Irwin Brakeman, RN Outcome: Adequate for Discharge   Problem: Clinical Measurements: Goal: Ability to maintain clinical measurements within normal limits will improve 08/13/2022 1451 by Irwin Brakeman, RN Outcome: Adequate for Discharge 08/13/2022 1451 by Irwin Brakeman, RN Outcome: Adequate for Discharge Goal: Will remain free from infection 08/13/2022 1451 by Irwin Brakeman, RN Outcome: Adequate for Discharge 08/13/2022 1451 by Irwin Brakeman, RN Outcome: Adequate for Discharge Goal: Diagnostic test results will improve 08/13/2022 1451 by Irwin Brakeman, RN Outcome: Adequate for Discharge 08/13/2022 1451 by Irwin Brakeman, RN Outcome: Adequate for Discharge Goal: Respiratory complications will improve 08/13/2022 1451 by Irwin Brakeman, RN Outcome: Adequate for Discharge 08/13/2022 1451 by Irwin Brakeman, RN Outcome: Adequate for Discharge Goal: Cardiovascular complication will be avoided 08/13/2022 1451 by Irwin Brakeman, RN Outcome: Adequate for Discharge 08/13/2022 1451 by Irwin Brakeman, RN Outcome: Adequate for Discharge   Problem: Activity: Goal: Risk for activity intolerance will decrease 08/13/2022 1451 by Irwin Brakeman, RN Outcome: Adequate for Discharge 08/13/2022 1451 by Irwin Brakeman, RN Outcome: Adequate for Discharge    Problem: Nutrition: Goal: Adequate nutrition will be maintained 08/13/2022 1451 by Irwin Brakeman, RN Outcome: Adequate for Discharge 08/13/2022 1451 by Irwin Brakeman, RN Outcome: Adequate for Discharge   Problem: Coping: Goal: Level of anxiety will decrease 08/13/2022 1451 by Irwin Brakeman, RN Outcome: Adequate for Discharge 08/13/2022 1451 by Irwin Brakeman, RN Outcome: Adequate for Discharge   Problem: Elimination: Goal: Will not experience complications related to bowel motility 08/13/2022 1451 by Irwin Brakeman, RN Outcome: Adequate for Discharge 08/13/2022 1451 by Irwin Brakeman, RN Outcome: Adequate for Discharge Goal: Will not experience complications related to urinary retention 08/13/2022 1451 by Irwin Brakeman, RN Outcome: Adequate for Discharge 08/13/2022 1451 by Irwin Brakeman, RN Outcome: Adequate for Discharge   Problem: Pain Managment: Goal: General experience of comfort will improve 08/13/2022 1451 by Irwin Brakeman, RN Outcome: Adequate for Discharge 08/13/2022 1451 by Irwin Brakeman, RN Outcome: Adequate for Discharge   Problem: Safety: Goal: Ability to remain free from injury will improve 08/13/2022 1451 by Irwin Brakeman, RN Outcome: Adequate for Discharge 08/13/2022 1451 by Irwin Brakeman, RN Outcome: Adequate for Discharge   Problem: Skin Integrity: Goal: Risk for impaired skin integrity will decrease 08/13/2022 1451 by Irwin Brakeman, RN Outcome: Adequate for Discharge 08/13/2022 1451 by Irwin Brakeman, RN Outcome: Adequate for Discharge

## 2022-08-31 ENCOUNTER — Encounter: Payer: Self-pay | Admitting: Student

## 2022-08-31 ENCOUNTER — Ambulatory Visit: Payer: Medicaid Other | Admitting: Student

## 2022-08-31 ENCOUNTER — Other Ambulatory Visit: Payer: Self-pay

## 2022-08-31 VITALS — BP 150/75 | HR 75 | Temp 98.2°F | Ht 67.0 in | Wt 183.5 lb

## 2022-08-31 DIAGNOSIS — E875 Hyperkalemia: Secondary | ICD-10-CM

## 2022-08-31 DIAGNOSIS — E1110 Type 2 diabetes mellitus with ketoacidosis without coma: Secondary | ICD-10-CM

## 2022-08-31 DIAGNOSIS — D631 Anemia in chronic kidney disease: Secondary | ICD-10-CM | POA: Diagnosis not present

## 2022-08-31 DIAGNOSIS — F1290 Cannabis use, unspecified, uncomplicated: Secondary | ICD-10-CM

## 2022-08-31 DIAGNOSIS — I129 Hypertensive chronic kidney disease with stage 1 through stage 4 chronic kidney disease, or unspecified chronic kidney disease: Secondary | ICD-10-CM | POA: Diagnosis present

## 2022-08-31 DIAGNOSIS — I10 Essential (primary) hypertension: Secondary | ICD-10-CM

## 2022-08-31 DIAGNOSIS — R911 Solitary pulmonary nodule: Secondary | ICD-10-CM | POA: Diagnosis not present

## 2022-08-31 DIAGNOSIS — E1122 Type 2 diabetes mellitus with diabetic chronic kidney disease: Secondary | ICD-10-CM

## 2022-08-31 DIAGNOSIS — Z794 Long term (current) use of insulin: Secondary | ICD-10-CM | POA: Diagnosis not present

## 2022-08-31 DIAGNOSIS — E1165 Type 2 diabetes mellitus with hyperglycemia: Secondary | ICD-10-CM | POA: Diagnosis not present

## 2022-08-31 DIAGNOSIS — Z0000 Encounter for general adult medical examination without abnormal findings: Secondary | ICD-10-CM

## 2022-08-31 DIAGNOSIS — N184 Chronic kidney disease, stage 4 (severe): Secondary | ICD-10-CM

## 2022-08-31 NOTE — Patient Instructions (Addendum)
Thank you, Ms.Whitman Hero for allowing [place] to provide your care today. Today we discussed your pneumonia and hospitalization, your medical history, your current medications, and next steps in your care.   I have ordered the following labs for you:  Lab Orders         CBC no Diff         Basic metabolic panel         Hepatitis C Antibody         Protein Electrophoresis, Urine Rflx.         Protein / creatinine index, urine         Serum protein electrophoresis with reflex         Immunofixation Interp, Urine         Kappa/lambda light chains      Tests ordered today:  None   Referrals ordered today:   Referral Orders         Ambulatory referral to Nephrology         Ambulatory referral to Dentistry     - Diabetes Eye exam   I have ordered the following medication/changed the following medications:   Stop the following medications: There are no discontinued medications.   Start the following medications: No orders of the defined types were placed in this encounter.    Follow up:   - 4 weeks   Remember:   - Changes your HYDRALAZINE medication: You are currently taking hydralazine 25 mg every 8 hours. Please start taking 2 pills, for a total dose of hydralazine 50 mg every 8 hours. You can use the pills you still have that you got from the hospital. I will send in the new prescription to your pharmacy so you have a refill ready.   - I will call you to review your lab results and next steps as needed.   - You will receive a call to schedule your nephrology visit, dentist visit, and diabetic eye exam. Please call our office if you haven't heard from one of them and it's been more than a week or two.   - You will come back in 4 weeks to discuss your chronic health problems and order repeat imaging of your lungs. Please try to log your blood pressures at home so we can review them at your next visit.   Should you have any questions or concerns please call the internal  medicine clinic at [phone].      Colbert Coyer, MD PGY-1 Internal Medicine Teaching Progam [facility]

## 2022-08-31 NOTE — Progress Notes (Signed)
New Patient Office Visit  Subjective    Patient ID: [name], female    DOB: 05-21-1980  Age: 42 y.o. MRN: [id number]  CC: No chief complaint on file.  HPI [name] presents to establish care and for hospital follow-up.   Patient was hospitalized from 08/11/22 to 08/13/22 for community acquired pneumonia. Per patient and chart review, she has a history of CKD IV, T2DM, anemia of chronic disease, HTN, GERD, and marijuana use.   Per patient, she presented to the hospital with left-sided chest pain, nausea and vomiting. CT scan with contrast noted focal nodular density within the superior segment of the LLL measuring 2.6 cm x 1.7 cm x 1.9 cm with findings concerning for pneumonia. Could not rule out malignancy. Patient started on Rocephin and Azithromycin before being transitioned to PO doxycycline 100 mg BID for 6 days following discharge. Patient reports no pain today, denies cough, CP, or SOB. Reports completing the antibiotic treatment and feeling much better.   Patient admitted with Hgb 6.6 and transfused with 1 unit of PRBCs. She received IV iron in the hospital. Today, patient is without overt signs of bleeding or having blood in stools.   Patient with history of HTN and was not taking meds regularly prior to admission. Discharged with blood pressure medications from the hospital including amlodipine 10 mg, hydralazine 25 mg q8h, and metoprolol 50 mg daily.   For her T2DM patient reported stopping insulin use in the hospital, only sliding scale as needed. Did not resume insulin use on discharge, A1C 6.1% on 08/11/22. Told to follow up outpatient as needed. Has had the ability to check glucose levels at home but hasn't lately since she is not using insulin. Denies any episodes of feeling weak or lightheaded. Denies dysuria, increased thirst, or acute vision changes. Uses reading glasses, has not had a diabetic eye exam.   For the patient's CKD 4, hospitalist who helped discharge the  patient suggested nephrology folllow up in the outpatient setting as they suspect she will be in need of epogen.   For health maintenance, patient reports pap smear from 1-2 years ago that was normal. Had tubal ligation 11 years ago. Has never had a colonoscopy before. Would like to have a dental referral today as she has not ben to the dentist in many years. Denies alcohol use or cigarette smoking. Endorses smoking marijuana to help de-stress throughout the week.   Patient lives at home with her son. 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 Has been dealing with recent stressors including passing of her father in a skilled nursing facility and having to provide support for her mother.    Outpatient Encounter Medications as of 08/31/2022  Medication Sig   sodium bicarbonate 650 MG tablet Take 1 tablet (650 mg total) by mouth 2 (two) times daily.   sodium zirconium cyclosilicate (LOKELMA) 10 g PACK packet Take 10 g by mouth 3 (three) times daily for 2 days.   acetaminophen (TYLENOL) 500 MG tablet Take 1,000 mg by mouth every 6 (six) hours as needed for moderate pain.   amLODipine (NORVASC) 10 MG tablet Take 1 tablet (10 mg total) by mouth daily.   doxycycline (VIBRA-TABS) 100 MG tablet Take 1 tablet (100 mg total) by mouth 2 (two) times daily.   ferrous sulfate 325 (65 FE) MG tablet Take 1 tablet (325 mg total) by mouth 2 (two) times daily with a meal. (Patient not taking: Reported on 08/11/2022)   hydrALAZINE (APRESOLINE) 25  MG tablet Take 1 tablet (25 mg total) by mouth every 8 (eight) hours.   metoCLOPramide (REGLAN) 5 MG tablet Take 1 tablet (5 mg total) by mouth 3 (three) times daily with meals for 14 days.   metoprolol succinate (TOPROL-XL) 50 MG 24 hr tablet Take 1 tablet (50 mg total) by mouth daily. Take with or immediately following a meal.   pantoprazole (PROTONIX) 40 MG tablet Take 1 tablet (40 mg total) by mouth daily.   No facility-administered encounter medications on file as of 08/31/2022.     Past Medical History:  Diagnosis Date   Anxiety    Asthma    Depression    Diabetes mellitus    Hyperemesis arising during pregnancy    Hypertension    Mental disorder     Past Surgical History:  Procedure Laterality Date   ESOPHAGOGASTRODUODENOSCOPY (EGD) WITH PROPOFOL N/A 08/07/2019   Procedure: ESOPHAGOGASTRODUODENOSCOPY (EGD) WITH PROPOFOL;  Surgeon: Hilarie Fredrickson, MD;  Location: [facility] ENDOSCOPY;  Service: Endoscopy;  Laterality: N/A;   NO PAST SURGERIES     TUBAL LIGATION  06/20/2011   Procedure: POST PARTUM TUBAL LIGATION;  Surgeon: Catalina Antigua, MD;  Location: [facility] ORS;  Service: Gynecology;  Laterality: Bilateral;  Bilateral post partum tubal ligation    Family History  Problem Relation Age of Onset   Heart disease Mother        CHF   Diabetes Sister    Anesthesia problems Neg Hx     Social History   Socioeconomic History   Marital status: Single    Spouse name: Not on file   Number of children: Not on file   Years of education: Not on file   Highest education level: Not on file  Occupational History   Not on file  Tobacco Use   Smoking status: Never   Smokeless tobacco: Never  Vaping Use   Vaping status: Never Used  Substance and Sexual Activity   Alcohol use: No    Comment: occassionally   Drug use: No    Types: Cocaine, Marijuana   Sexual activity: Yes    Birth control/protection: None  Other Topics Concern   Not on file  Social History Narrative   Not on file   Social Determinants of Health   Financial Resource Strain: Not on file  Food Insecurity: No Food Insecurity (08/11/2022)   Hunger Vital Sign    Worried About Running Out of Food in the Last Year: Never true    Ran Out of Food in the Last Year: Never true  Transportation Needs: No Transportation Needs (08/11/2022)   PRAPARE - Administrator, Civil Service (Medical): No    Lack of Transportation (Non-Medical): No  Physical Activity: Not on file  Stress: Not on file  Social  Connections: Not on file  Intimate Partner Violence: Not At Risk (08/11/2022)   Humiliation, Afraid, Rape, and Kick questionnaire    Fear of Current or Ex-Partner: No    Emotionally Abused: No    Physically Abused: No    Sexually Abused: No    Review of Systems  Constitutional:  Negative for chills and fever.  HENT:  Negative for hearing loss.   Respiratory:  Negative for cough and shortness of breath.   Cardiovascular:  Negative for chest pain, palpitations and leg swelling.  Gastrointestinal:  Negative for abdominal pain, diarrhea, nausea and vomiting.  Genitourinary:  Negative for dysuria.  Skin:  Negative for rash.  Neurological:  Negative for headaches.  Psychiatric/Behavioral:  Negative for depression.        Under a lot of stress.      Objective    BP (!) 150/75 (BP Location: Left Arm, Patient Position: Sitting, Cuff Size: Normal)   Pulse 75   Temp 98.2 F (36.8 C) (Oral)   Ht 5\' 7"  (1.702 m)   Wt 183 lb 8 oz (83.2 kg)   SpO2 100%   BMI 28.74 kg/m   Physical Exam HENT:     Head: Normocephalic and atraumatic.     Nose: Nose normal.     Mouth/Throat:     Mouth: Mucous membranes are moist.  Eyes:     Extraocular Movements: Extraocular movements intact.     Pupils: Pupils are equal, round, and reactive to light.  Cardiovascular:     Rate and Rhythm: Normal rate and regular rhythm.     Pulses: Normal pulses.     Heart sounds: No murmur heard. Pulmonary:     Effort: Pulmonary effort is normal.     Breath sounds: Normal breath sounds.  Abdominal:     General: Bowel sounds are normal. There is no distension.     Palpations: Abdomen is soft.  Musculoskeletal:        General: No swelling or tenderness.  Skin:    General: Skin is warm and dry.  Neurological:     General: No focal deficit present.     Mental Status: She is alert and oriented to person, place, and time.  Psychiatric:        Mood and Affect: Mood normal.        Behavior: Behavior normal.     Last hemoglobin A1c Lab Results  Component Value Date   HGBA1C 6.1 (H) 08/11/2022      Assessment & Plan:   Problem List Items Addressed This Visit       Cardiovascular and Mediastinum   Hypertension - Primary    Patient reports taking amlodipine 10 mg, hydralazine 25 mg q8h, and metoprolol 50 mg consistently. Last three blood pressures include 150/75 (8/13), 142/73 (7/26) and 175/82 (7/26). Patient has the ability to measure BP at home but currently does not. Denies CP, heart racing, palpitations, or headache today. CV exam unremarkable.   Plan:  - Increase from hydralazine 25 mg q8h to hydralazine 50 mg q8h as BP continue to be elevated on current regimen.  - Patient encouraged to take blood pressures at home.          Respiratory   Lung nodule seen on imaging study    Patient completed antibiotic treatment with doxycycline for CAP. Denies any SOB, chest pain, fever, chills, cough, or congestion. Afebrile with normal respiratory effort and lung sounds on exam.  Plan:  - Repeat chest x ray in 6-8 weeks to help monitor lung nodule size identified on chest CT on admission and to help monitor CAP resolution - Repeat CBC today        Endocrine   Type 2 diabetes mellitus (HCC)    Patient with history of T2DM . Patient's insulin held at after discharge. A1C 6.1% on 08/11/22. Has not been checking daily glucose levels. Denies weakness, lightheadedness, dysuria, increased thirst, acute vision changes, or neuropathic pain. No history of prior diabetic eye exam.  Plan:  - Repeat A1C, follow up as needed - Referral placed for diabetic eye exam      Relevant Orders   HM Diabetes Eye Exam (Completed)     Genitourinary  CKD (chronic kidney disease), stage IV (HCC)    CKD likely from HTN or diabetes, but will do some more work up to help determine cause of declining kidney function. Patient producing own urine, denies any dysuria today. Does have associated anemia.  Plan:  -  Will follow up Hgb from CBC today - Repeat BMP - Place referral for nephrology - Other labs ordered today: Protein electrophoresis, Serum protein electrophoresis with reflex, immunofixation, Kappa/lambda light chains, and Protein/Creatinine Ratio; will follow up with labs as needed      Relevant Orders   Ambulatory referral to Nephrology   Basic metabolic panel (Completed)   Protein Electrophoresis, Urine Rflx.   Serum protein electrophoresis with reflex (Completed)   Immunofixation Interp, Urine   Kappa/lambda light chains (Completed)   Protein / Creatinine Ratio, Urine   Hepatitis C Antibody     Other   Marijuana use    Patient reports smoking 2-3 blunts some nights a week to help de-stress. Discussed other options to help patient with any anxiety or depression she may be feeling, as well as alternatives to managing stress like exercise, support groups, talk therapy, etc. Patient expressed she will keep open mind, can discuss again at 6-8 week follow up.       Health care maintenance    Placed a referral for dentistry today. Per patient, normal pap smear from 1-2 years ago. Can work on addressing other care gaps like colonoscopy, Hep C, and flu vaccine.       Relevant Orders   Ambulatory referral to Dentistry   Other Visit Diagnoses     Anemia due to stage 4 chronic kidney disease (HCC)       Relevant Orders   CBC no Diff (Completed)       Return in about 4 weeks (around 09/28/2022) for HTN, CKD4, diabetes follow up, imaging for lung nodule.   Patient seen with Dr. Oswaldo Done.    Colbert Coyer, MD

## 2022-09-01 ENCOUNTER — Other Ambulatory Visit: Payer: Self-pay | Admitting: Student

## 2022-09-01 DIAGNOSIS — Z0000 Encounter for general adult medical examination without abnormal findings: Secondary | ICD-10-CM | POA: Insufficient documentation

## 2022-09-01 DIAGNOSIS — E875 Hyperkalemia: Secondary | ICD-10-CM | POA: Insufficient documentation

## 2022-09-01 MED ORDER — LOKELMA 10 G PO PACK
10.0000 g | PACK | Freq: Three times a day (TID) | ORAL | 0 refills | Status: AC
Start: 2022-09-01 — End: 2022-09-03

## 2022-09-01 MED ORDER — SODIUM BICARBONATE 650 MG PO TABS: 650.0000 mg | ORAL_TABLET | Freq: Two times a day (BID) | ORAL | 1 refills | Status: DC

## 2022-09-01 NOTE — Assessment & Plan Note (Addendum)
Patient reports smoking 2-3 blunts some nights a week to help de-stress. Discussed other options to help patient with any anxiety or depression she may be feeling, as well as alternatives to managing stress like exercise, support groups, talk therapy, etc. Patient expressed she will keep open mind, can discuss again at 6-8 week follow up.

## 2022-09-01 NOTE — Assessment & Plan Note (Signed)
CKD likely from HTN or diabetes, but will do some more work up to help determine cause of declining kidney function. Patient producing own urine, denies any dysuria today. Does have associated anemia.  Plan:  - Will follow up Hgb from CBC today - Repeat BMP - Place referral for nephrology - Other labs ordered today: Protein electrophoresis, Serum protein electrophoresis with reflex, immunofixation, Kappa/lambda light chains, and Protein/Creatinine Ratio; will follow up with labs as needed

## 2022-09-01 NOTE — Assessment & Plan Note (Signed)
Patient with history of T2DM . Patient's insulin held at after discharge. A1C 6.1% on 08/11/22. Has not been checking daily glucose levels. Denies weakness, lightheadedness, dysuria, increased thirst, acute vision changes, or neuropathic pain. No history of prior diabetic eye exam.  Plan:  - Repeat A1C, follow up as needed - Referral placed for diabetic eye exam

## 2022-09-01 NOTE — Assessment & Plan Note (Signed)
Patient completed antibiotic treatment with doxycycline for CAP. Denies any SOB, chest pain, fever, chills, cough, or congestion. Afebrile with normal respiratory effort and lung sounds on exam.  Plan:  - Repeat chest x ray in 6-8 weeks to help monitor lung nodule size identified on chest CT on admission and to help monitor CAP resolution - Repeat CBC today

## 2022-09-01 NOTE — Assessment & Plan Note (Signed)
Patient reports taking amlodipine 10 mg, hydralazine 25 mg q8h, and metoprolol 50 mg consistently. Last three blood pressures include 150/75 (8/13), 142/73 (7/26) and 175/82 (7/26). Patient has the ability to measure BP at home but currently does not. Denies CP, heart racing, palpitations, or headache today. CV exam unremarkable.   Plan:  - Increase from hydralazine 25 mg q8h to hydralazine 50 mg q8h as BP continue to be elevated on current regimen.  - Patient encouraged to take blood pressures at home.

## 2022-09-01 NOTE — Progress Notes (Signed)
Spoke to patient on the phone, identity verified with DOB. Discussed results of CBC and BMP, highlighting the elevated potassium of 5.5 and non-gap acidosis. Communicated the importance of picking up two medications, Lokelma to help lower the potassium, and sodium bicarbonate for the non-gap acidosis. Reviewed instructions for both medications, and recommended a follow-up lab visit in 7 days to follow up on these levels. Patient acknowledged understanding.

## 2022-09-01 NOTE — Assessment & Plan Note (Signed)
Placed a referral for dentistry today. Per patient, normal pap smear from 1-2 years ago. Can work on addressing other care gaps like colonoscopy, Hep C, and flu vaccine.

## 2022-09-02 LAB — PROTEIN / CREATININE RATIO, URINE
Creatinine, Urine: 215.3 mg/dL
Protein, Ur: 575.5 mg/dL
Protein/Creat Ratio: 2673 mg/g{creat} — ABNORMAL HIGH (ref 0–200)

## 2022-09-02 LAB — SPECIMEN STATUS REPORT

## 2022-09-02 NOTE — Progress Notes (Signed)
Internal Medicine Clinic Attending  I was physically present during the key portions of the resident provided service and participated in the medical decision making of patient's management care. I reviewed pertinent patient test results.  The assessment, diagnosis, and plan were formulated together and I agree with the documentation in the resident's note.  New patient to our clinic who already has advanced CKD at a young age. 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 She has a strong family history of hypertension, but I wonder why it developed so early in her case. She is symptomatically well. We talked about the benefits of establishing care with nephrology now to help preserve her renal function and plan for a high possibility of renal replacement therapy in the future. Labs noted moderate hyperkalemia which we will treat medically with lokelma, does not seem to have been an issue in the past. Also has worsening non-gap acidosis which we will treat with bicarb to help preserve renal function. Will quantify proteinuria and rule out paraproteins. Will manage hypertension and raise hydralazine, medications are limited by her renal function.  Erlinda Hong, MD FACP

## 2022-09-03 ENCOUNTER — Telehealth: Payer: Self-pay | Admitting: *Deleted

## 2022-09-03 NOTE — Telephone Encounter (Signed)
Received a call from pt stating she went to the pharmacy and was told no medications were sent in by the doctor she saw (on 8/13). Stated she does not know the name of the meds. I called [facility] - stated Lokelma had to be ordered and Sodium bicarbonate is not covered by her insurance (cost - $5.99). Stated [facility] will not be available until Monday or they can transfer it [facility] a different [facility] and have it by the w/e. I called and informed pt of the above note per [facility]. Stated she will call [facility] to [facility] so she can pick it up by the w/e.

## 2022-09-06 LAB — SPECIMEN STATUS REPORT

## 2022-09-06 LAB — HEPATITIS C ANTIBODY: Hep C Virus Ab: NONREACTIVE

## 2022-09-07 NOTE — Telephone Encounter (Signed)
RTC to patient states that she was returning a call from the doctor.  Patient stated when asked that she had picked up the [facility] which cost her over $5.99.  Patient said that she also picked up a prescription that had a lot of pills and was unable to pick another medication that she did not know the name of.  Patient would like to speak with the doctor about her medication

## 2022-09-08 ENCOUNTER — Other Ambulatory Visit: Payer: Self-pay | Admitting: Student

## 2022-09-08 DIAGNOSIS — Z794 Long term (current) use of insulin: Secondary | ICD-10-CM

## 2022-09-08 DIAGNOSIS — I10 Essential (primary) hypertension: Secondary | ICD-10-CM

## 2022-09-08 NOTE — Addendum Note (Signed)
Addended by: Colbert Coyer, Dabid [place] on: 09/08/2022 11:56 AM   Modules accepted: Orders

## 2022-09-16 ENCOUNTER — Other Ambulatory Visit: Payer: Medicaid Other

## 2022-09-16 ENCOUNTER — Other Ambulatory Visit: Payer: Self-pay | Admitting: Internal Medicine

## 2022-09-16 ENCOUNTER — Telehealth: Payer: Self-pay

## 2022-09-16 DIAGNOSIS — E875 Hyperkalemia: Secondary | ICD-10-CM

## 2022-09-16 DIAGNOSIS — N184 Chronic kidney disease, stage 4 (severe): Secondary | ICD-10-CM

## 2022-09-16 NOTE — Telephone Encounter (Signed)
Pt is requesting a call back ... She stated that her  legs are swollen... there is no opening  for her liking ( evening ) so she took the first 9/11 with Dr Allena Katz .Marland Kitchen Pt does have a lab only appt today 8/29

## 2022-09-16 NOTE — Progress Notes (Signed)
Patient has lab only appt today at 3pm. Placed order for BMP to recheck patient's potassium and bicarb.

## 2022-09-16 NOTE — Telephone Encounter (Signed)
Return pt's call who stated her feet has been swollen x 3 days. Denies pain. 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 Stated this is a new problem. No available appts today nor tomorrow at this time. She has a lab appt today @ 3PM but there are no orders?

## 2022-09-16 NOTE — Telephone Encounter (Signed)
I had talked to Dr Lafonda Mosses, Attending, about placing a lab order: I didn't realized Dr Ned Card had already placed the order. I talked to Dr Lafonda Mosses about pt c/o feet swelling; she stated will wait on lab result before proceeding.

## 2022-09-17 NOTE — Progress Notes (Signed)
Called patient and discussed BMP results. Kidney function relatively unchanged compared to previous visit 2 weeks ago and hyperkalemia is improved, with K within normal limits now.   Calcium has been chronically low (likely secondary to CKD) and is now critically low. I have added on a phosphorus level and liver profile, to check the patient's albumin in order to correct the calcium level. Unable to add on vitamin D level or PTH (could check those at neck office visit on 9/11).   Patient endorses numbness in her left 5th digit only, denies any other fingertip numbness or perioral numbness. Also denies muscle cramps/spasms, muscle weakness, or confusion. Advised her to call the after-hours number if she develops these symptoms, or, to go to the ED if she has more serious symptoms of hypocalcemia, like a seizure.   Will hold off on oral calcium repletion at this time, as she is relatively asymptomatic.

## 2022-09-17 NOTE — Addendum Note (Signed)
Addended by: Bufford Spikes on: 09/17/2022 01:07 PM   Modules accepted: Orders

## 2022-09-17 NOTE — Addendum Note (Signed)
Addended by: Elza Rafter on: 09/17/2022 01:02 PM   Modules accepted: Orders

## 2022-09-21 LAB — HEPATIC FUNCTION PANEL
ALT: 7 IU/L (ref 0–32)
AST: 14 IU/L (ref 0–40)
Albumin: 3.8 g/dL — ABNORMAL LOW (ref 3.9–4.9)
Alkaline Phosphatase: 92 IU/L (ref 44–121)
Bilirubin Total: <0.2 mg/dL (ref 0.0–1.2)
Bilirubin, Direct: <0.1 mg/dL (ref 0.00–0.40)
Total Protein: 6.8 g/dL (ref 6.0–8.5)

## 2022-09-21 LAB — PHOSPHORUS: Phosphorus: 4.8 mg/dL — ABNORMAL HIGH (ref 3.0–4.3)

## 2022-09-21 LAB — SPECIMEN STATUS REPORT

## 2022-09-23 LAB — BMP8+ANION GAP
BUN/Creatinine Ratio: 9 (ref 9–23)
BUN: 40 mg/dL — ABNORMAL HIGH (ref 6–24)
Calcium: 6.2 mg/dL — CL (ref 8.7–10.2)
Chloride: 106 mmol/L (ref 96–106)
Creatinine, Ser: 4.61 mg/dL — ABNORMAL HIGH (ref 0.57–1.00)
Glucose: 136 mg/dL — ABNORMAL HIGH (ref 70–99)
Potassium: 4.6 mmol/L (ref 3.5–5.2)
Sodium: 136 mmol/L (ref 134–144)
eGFR: 11 mL/min/{1.73_m2} — ABNORMAL LOW (ref >59)

## 2022-09-29 ENCOUNTER — Other Ambulatory Visit: Payer: Self-pay

## 2022-09-29 ENCOUNTER — Inpatient Hospital Stay (HOSPITAL_COMMUNITY)
Admission: EM | Admit: 2022-09-29 | Discharge: 2022-10-01 | DRG: 641 | Disposition: A | Discharge status: Home / Self Care | Payer: Medicaid Other | Attending: Internal Medicine | Admitting: Internal Medicine

## 2022-09-29 ENCOUNTER — Encounter: Payer: Self-pay | Admitting: Student

## 2022-09-29 ENCOUNTER — Ambulatory Visit: Payer: Medicaid Other | Admitting: Student

## 2022-09-29 ENCOUNTER — Telehealth: Payer: Self-pay | Admitting: Student

## 2022-09-29 ENCOUNTER — Encounter (HOSPITAL_COMMUNITY): Payer: Self-pay

## 2022-09-29 ENCOUNTER — Emergency Department (HOSPITAL_COMMUNITY): Payer: Medicaid Other

## 2022-09-29 DIAGNOSIS — Z841 Family history of disorders of kidney and ureter: Secondary | ICD-10-CM

## 2022-09-29 DIAGNOSIS — D631 Anemia in chronic kidney disease: Secondary | ICD-10-CM | POA: Diagnosis present

## 2022-09-29 DIAGNOSIS — I3139 Other pericardial effusion (noninflammatory): Secondary | ICD-10-CM | POA: Diagnosis not present

## 2022-09-29 DIAGNOSIS — Z91013 Allergy to seafood: Secondary | ICD-10-CM

## 2022-09-29 DIAGNOSIS — E875 Hyperkalemia: Secondary | ICD-10-CM | POA: Diagnosis present

## 2022-09-29 DIAGNOSIS — E872 Acidosis, unspecified: Secondary | ICD-10-CM | POA: Diagnosis present

## 2022-09-29 DIAGNOSIS — Z6829 Body mass index (BMI) 29.0-29.9, adult: Secondary | ICD-10-CM

## 2022-09-29 DIAGNOSIS — I129 Hypertensive chronic kidney disease with stage 1 through stage 4 chronic kidney disease, or unspecified chronic kidney disease: Secondary | ICD-10-CM | POA: Diagnosis not present

## 2022-09-29 DIAGNOSIS — R911 Solitary pulmonary nodule: Secondary | ICD-10-CM

## 2022-09-29 DIAGNOSIS — Z885 Allergy status to narcotic agent status: Secondary | ICD-10-CM

## 2022-09-29 DIAGNOSIS — E1110 Type 2 diabetes mellitus with ketoacidosis without coma: Secondary | ICD-10-CM

## 2022-09-29 DIAGNOSIS — E8809 Other disorders of plasma-protein metabolism, not elsewhere classified: Secondary | ICD-10-CM | POA: Diagnosis present

## 2022-09-29 DIAGNOSIS — Z87892 Personal history of anaphylaxis: Secondary | ICD-10-CM

## 2022-09-29 DIAGNOSIS — E1165 Type 2 diabetes mellitus with hyperglycemia: Secondary | ICD-10-CM | POA: Diagnosis present

## 2022-09-29 DIAGNOSIS — J45909 Unspecified asthma, uncomplicated: Secondary | ICD-10-CM | POA: Diagnosis present

## 2022-09-29 DIAGNOSIS — Z833 Family history of diabetes mellitus: Secondary | ICD-10-CM

## 2022-09-29 DIAGNOSIS — N184 Chronic kidney disease, stage 4 (severe): Secondary | ICD-10-CM | POA: Diagnosis not present

## 2022-09-29 DIAGNOSIS — Z8249 Family history of ischemic heart disease and other diseases of the circulatory system: Secondary | ICD-10-CM

## 2022-09-29 DIAGNOSIS — N185 Chronic kidney disease, stage 5: Secondary | ICD-10-CM | POA: Diagnosis present

## 2022-09-29 DIAGNOSIS — I10 Essential (primary) hypertension: Secondary | ICD-10-CM | POA: Diagnosis present

## 2022-09-29 DIAGNOSIS — R630 Anorexia: Secondary | ICD-10-CM | POA: Diagnosis present

## 2022-09-29 DIAGNOSIS — E1122 Type 2 diabetes mellitus with diabetic chronic kidney disease: Secondary | ICD-10-CM | POA: Diagnosis present

## 2022-09-29 DIAGNOSIS — E559 Vitamin D deficiency, unspecified: Secondary | ICD-10-CM | POA: Insufficient documentation

## 2022-09-29 DIAGNOSIS — M7989 Other specified soft tissue disorders: Secondary | ICD-10-CM | POA: Diagnosis not present

## 2022-09-29 DIAGNOSIS — K219 Gastro-esophageal reflux disease without esophagitis: Secondary | ICD-10-CM | POA: Diagnosis present

## 2022-09-29 DIAGNOSIS — Z794 Long term (current) use of insulin: Secondary | ICD-10-CM

## 2022-09-29 DIAGNOSIS — Z79899 Other long term (current) drug therapy: Secondary | ICD-10-CM

## 2022-09-29 DIAGNOSIS — E119 Type 2 diabetes mellitus without complications: Secondary | ICD-10-CM

## 2022-09-29 DIAGNOSIS — I120 Hypertensive chronic kidney disease with stage 5 chronic kidney disease or end stage renal disease: Secondary | ICD-10-CM | POA: Diagnosis present

## 2022-09-29 DIAGNOSIS — R202 Paresthesia of skin: Secondary | ICD-10-CM | POA: Diagnosis present

## 2022-09-29 DIAGNOSIS — D508 Other iron deficiency anemias: Secondary | ICD-10-CM | POA: Diagnosis present

## 2022-09-29 LAB — CBC WITH DIFFERENTIAL/PLATELET
Abs Immature Granulocytes: 0.02 10*3/uL (ref 0.00–0.07)
Basophils Absolute: 0 10*3/uL (ref 0.0–0.1)
Basophils Relative: 1 %
Eosinophils Absolute: 0.2 10*3/uL (ref 0.0–0.5)
Eosinophils Relative: 3 %
HCT: 24.6 % — ABNORMAL LOW (ref 36.0–46.0)
Hemoglobin: 7.4 g/dL — ABNORMAL LOW (ref 12.0–15.0)
Immature Granulocytes: 0 %
Lymphocytes Relative: 37 %
Lymphs Abs: 2.1 10*3/uL (ref 0.7–4.0)
MCH: 23 pg — ABNORMAL LOW (ref 26.0–34.0)
MCHC: 30.1 g/dL (ref 30.0–36.0)
MCV: 76.4 fL — ABNORMAL LOW (ref 80.0–100.0)
Monocytes Absolute: 0.4 10*3/uL (ref 0.1–1.0)
Monocytes Relative: 7 %
Neutro Abs: 3 10*3/uL (ref 1.7–7.7)
Neutrophils Relative %: 52 %
Platelets: 302 10*3/uL (ref 150–400)
RBC: 3.22 MIL/uL — ABNORMAL LOW (ref 3.87–5.11)
RDW: 23.2 % — ABNORMAL HIGH (ref 11.5–15.5)
WBC: 5.7 10*3/uL (ref 4.0–10.5)
nRBC: 0 % (ref 0.0–0.2)

## 2022-09-29 LAB — BASIC METABOLIC PANEL
Anion gap: 12 (ref 5–15)
BUN: 34 mg/dL — ABNORMAL HIGH (ref 6–20)
CO2: 16 mmol/L — ABNORMAL LOW (ref 22–32)
Calcium: 6.4 mg/dL — CL (ref 8.9–10.3)
Chloride: 108 mmol/L (ref 98–111)
Creatinine, Ser: 5.1 mg/dL — ABNORMAL HIGH (ref 0.44–1.00)
GFR, Estimated: 10 mL/min — ABNORMAL LOW (ref >60)
Glucose, Bld: 109 mg/dL — ABNORMAL HIGH (ref 70–99)
Potassium: 4.9 mmol/L (ref 3.5–5.1)
Sodium: 136 mmol/L (ref 135–145)

## 2022-09-29 LAB — MAGNESIUM: Magnesium: 1.7 mg/dL (ref 1.7–2.4)

## 2022-09-29 LAB — COMPREHENSIVE METABOLIC PANEL
ALT: 13 U/L (ref 0–44)
AST: 20 U/L (ref 15–41)
Albumin: 3.5 g/dL (ref 3.5–5.0)
Alkaline Phosphatase: 79 U/L (ref 38–126)
Anion gap: 10 (ref 5–15)
BUN: 31 mg/dL — ABNORMAL HIGH (ref 6–20)
CO2: 16 mmol/L — ABNORMAL LOW (ref 22–32)
Calcium: 6.2 mg/dL — CL (ref 8.9–10.3)
Chloride: 107 mmol/L (ref 98–111)
Creatinine, Ser: 5.03 mg/dL — ABNORMAL HIGH (ref 0.44–1.00)
GFR, Estimated: 10 mL/min — ABNORMAL LOW (ref >60)
Glucose, Bld: 110 mg/dL — ABNORMAL HIGH (ref 70–99)
Potassium: 4.6 mmol/L (ref 3.5–5.1)
Sodium: 133 mmol/L — ABNORMAL LOW (ref 135–145)
Total Bilirubin: 0.2 mg/dL — ABNORMAL LOW (ref 0.3–1.2)
Total Protein: 7.7 g/dL (ref 6.5–8.1)

## 2022-09-29 MED ORDER — PANTOPRAZOLE SODIUM 40 MG PO TBEC
40.0000 mg | DELAYED_RELEASE_TABLET | Freq: Every day | ORAL | Status: DC
  Administered 2022-09-30 – 2022-10-01 (×2): 40 mg via ORAL
  Filled 2022-09-29 (×2): qty 1

## 2022-09-29 MED ORDER — ACETAMINOPHEN 325 MG PO TABS
650.0000 mg | ORAL_TABLET | Freq: Four times a day (QID) | ORAL | Status: DC | PRN
  Administered 2022-09-30 – 2022-10-01 (×3): 650 mg via ORAL
  Filled 2022-09-29 (×3): qty 2

## 2022-09-29 MED ORDER — ACETAMINOPHEN 650 MG RE SUPP: 650.0000 mg | Freq: Four times a day (QID) | RECTAL | Status: DC | PRN

## 2022-09-29 MED ORDER — CALCIUM GLUCONATE-NACL 1-0.675 GM/50ML-% IV SOLN
1.0000 g | Freq: Once | INTRAVENOUS | Status: AC
  Administered 2022-09-30: 1000 mg via INTRAVENOUS
  Filled 2022-09-29: qty 50

## 2022-09-29 MED ORDER — HEPARIN SODIUM (PORCINE) 5000 UNIT/ML IJ SOLN
5000.0000 [IU] | Freq: Three times a day (TID) | INTRAMUSCULAR | Status: DC
  Administered 2022-09-30 – 2022-10-01 (×5): 5000 [IU] via SUBCUTANEOUS
  Filled 2022-09-29 (×6): qty 1

## 2022-09-29 MED ORDER — HYDRALAZINE HCL 50 MG PO TABS
50.0000 mg | ORAL_TABLET | Freq: Three times a day (TID) | ORAL | Status: DC
  Administered 2022-09-30 (×4): 50 mg via ORAL
  Filled 2022-09-29: qty 2
  Filled 2022-09-29 (×3): qty 1

## 2022-09-29 MED ORDER — METOPROLOL SUCCINATE ER 25 MG PO TB24
50.0000 mg | ORAL_TABLET | Freq: Every day | ORAL | Status: DC
  Administered 2022-09-30 – 2022-10-01 (×2): 50 mg via ORAL
  Filled 2022-09-29 (×2): qty 2

## 2022-09-29 MED ORDER — HYDRALAZINE HCL 50 MG PO TABS: 25.0000 mg | ORAL_TABLET | Freq: Three times a day (TID) | ORAL | 11 refills | Status: DC

## 2022-09-29 MED ORDER — SENNOSIDES-DOCUSATE SODIUM 8.6-50 MG PO TABS: 1.0000 | ORAL_TABLET | Freq: Every evening | ORAL | Status: DC | PRN

## 2022-09-29 NOTE — ED Triage Notes (Signed)
Pt states that she has had tingling in hands x 1 week. PMD called her today after finding Calcium level was low. (6.2) Level has been low for a couple of weeks. Denies any other physical symptoms at this time

## 2022-09-29 NOTE — ED Notes (Signed)
Patient transported to CT 

## 2022-09-29 NOTE — Patient Instructions (Addendum)
[name], Brabender you for allowing me to take part in your care today.  Here are your instructions.  1.  Regarding your lung nodule, we will obtain a CT scan of your chest.  Please await phone call to schedule.  2.  Regarding your blood pressure, stop taking your amlodipine.  Increase your hydralazine to 50 mg 3 times a day.  3.  Regarding your kidney disease, please call your nephrologist.  You need to see them as your kidney function is worsening. [name] MD [facility] [address] [place], [place] [address] Phone: [phone]  4.  Regarding your low calcium levels, if they are severely low on the check today, we will likely need you to be admitted to the hospital.  I will call you with the results and at that time I will try to go to the emergency department if they are severely low.  Thank you, Dr. Allena Katz  If you have any other questions please contact the internal medicine clinic at [phone] If it is after hours, please call the [facility] at [phone] and then ask the person who picks up for the resident on call.

## 2022-09-29 NOTE — Assessment & Plan Note (Addendum)
Patient previously had BMP findings of severely low calcium.  Repeated CMP today showing severely decreased calcium still at 6.2.  Send the patient to the emergency department for further evaluation and management.  Patient will likely need admission.  This could be related to underlying renal disease, primary hypoparathyroidism, or could be related to hypercalciuria.  Plan: -Would like patient to be sent to the emergency department for admission -Can start calcium infusion -Would recommend trending calcium while inpatient -Would recommend getting EKG there as well -Would like to obtain PTH and urinary calcium while on patient as well

## 2022-09-29 NOTE — H&P (Signed)
     Date: 09/29/2022               Patient Name:  [name] MRN: [id number]  DOB: 1980/02/15 Age / Sex: 42 y.o., female   PCP: Philomena Doheny, MD         Medical Service: Internal Medicine Teaching Service         Attending Physician: Dr. Reymundo Poll, MD    First Contact: Dr. Carmina Miller Pager: [phone]   Second Contact: Dr. Marrianne Mood Pager: [phone]        After Hours (After 5p/  First Contact Pager: [phone]  weekends / holidays): Second Contact Pager: [phone]   Chief Complaint: abnormal lab, low calcium  History of Present Illness: *** [name] is 42 y.o. female with PMH of ***  -bilateral leg swelling but has since improved  -left 5th digit of hand tingling that is intermittent  -endorses occasional muscle cramps throughout body in particularly legs  -denies any worsening of muscle spasms -denies any perioral numbness -still voiding and no acute changes -denies cp, SOB, vision changes, headache, n/v, abdominal pain, constipation -denies any recent signs of bleeding   -seen in [facility] today, noted critically low calcium and advised to be further evaluation in ED and calcium repletion   Discussed code status and patient elected FULL Code. ***  ED Course: ***  Meds: *** -hydralazine 50 mg TID -Toprol 50 mg daily -Protonix 40 mg daily -sodium bicarbonate 650 mg BID -  No outpatient medications have been marked as taking for the 09/29/22 encounter [facility] Encounter).   Allergies: Allergies as of 09/29/2022 - Review Complete 09/29/2022  Allergen Reaction Noted   Shellfish allergy Anaphylaxis and Swelling 04/05/2012   Morphine and codeine Nausea And Vomiting 08/03/2010   PMH: Past Medical History:  Diagnosis Date   Anxiety    Asthma    Depression    Diabetes mellitus    Hyperemesis arising during pregnancy    Hypertension    Mental disorder     Family History: *** -No family hx of thyroid or parathyroid disease or cancer   -Mother had kidney disease, was on dialysis -Son is healthy  Family History  Problem Relation Age of Onset   Heart disease Mother        CHF   Diabetes Sister    Anesthesia problems Neg Hx    Social History: *** Lives: *** Function: *** PCP: Philomena Doheny, MD Tobacco: denies Alcohol: denies Recreational Drug Use: occasional smoking marijuana   Review of Systems: A complete ROS was negative except as per HPI. ***  Physical Exam: Blood pressure (!) 151/87, pulse 65, temperature 98.4 F (36.9 C), temperature source Oral, resp. rate 18, height 5\' 7"  (1.702 m), weight 84.8 kg, SpO2 100%. ***  EKG: personally reviewed my interpretation is***  CXR: personally reviewed my interpretation is***  Assessment & Plan by Problem: Active Problems:   * No active hospital problems. *   Dispo: Admit patient to Observation with expected length of stay less than 2 midnights.  Signed: Philomena Doheny, MD 09/29/2022, 10:11 PM  After 5pm on weekdays and 1pm on weekends: On Call pager: [phone]

## 2022-09-29 NOTE — ED Provider Notes (Signed)
[place] EMERGENCY DEPARTMENT AT [facility] Provider Note   CSN: [id number] Arrival date & time: 09/29/22  1829     History  Chief Complaint  Patient presents with   Abnormal Lab    [name] is a 42 y.o. female.  42 year old female sent here by PCP after her calcium was noted to be low.  Reviewed the patient's PCP office note from today.  Patient has a history of chronic kidney disease, heart failure.  She says that over the last couple of days she has noticed some tingling in her hands.   Abnormal Lab      Home Medications Prior to Admission medications   Medication Sig Start Date End Date Taking? Authorizing Provider  acetaminophen (TYLENOL) 500 MG tablet Take 1,000 mg by mouth every 6 (six) hours as needed for moderate pain.    [provider]  hydrALAZINE (APRESOLINE) 50 MG tablet Take 0.5 tablets (25 mg total) by mouth 3 (three) times daily. 09/29/22 09/29/23  Modena Slater, DO  metoprolol succinate (TOPROL-XL) 50 MG 24 hr tablet Take 1 tablet (50 mg total) by mouth daily. Take with or immediately following a meal. 08/14/22   Marlin Canary U, DO  pantoprazole (PROTONIX) 40 MG tablet Take 1 tablet (40 mg total) by mouth daily. 08/14/22   Wenceslao Loper Art, DO  sodium bicarbonate 650 MG tablet Take 1 tablet (650 mg total) by mouth 2 (two) times daily. 09/01/22 09/01/23  Tyson Alias, MD      Allergies    Shellfish allergy and Morphine and codeine    Review of Systems   Review of Systems  Physical Exam Updated Vital Signs BP (!) 151/87   Pulse 65   Temp 98.4 F (36.9 C) (Oral)   Resp 18   Ht 5\' 7"  (1.702 m)   Wt 84.8 kg   SpO2 100%   BMI 29.28 kg/m  Physical Exam Vitals reviewed.  Cardiovascular:     Rate and Rhythm: Normal rate.     Heart sounds: No murmur heard. Musculoskeletal:     Right lower leg: Edema present.     Left lower leg: Edema present.  Skin:    General: Skin is warm and dry.  Neurological:     General: No  focal deficit present.     Mental Status: She is alert.     Motor: No weakness.     Gait: Gait normal.     ED Results / Procedures / Treatments   Labs (all labs ordered are listed, but only abnormal results are displayed) Labs Reviewed  CBC WITH DIFFERENTIAL/PLATELET - Abnormal; Notable for the following components:      Result Value   RBC 3.22 (*)    Hemoglobin 7.4 (*)    HCT 24.6 (*)    MCV 76.4 (*)    MCH 23.0 (*)    RDW 23.2 (*)    All other components within normal limits  BASIC METABOLIC PANEL - Abnormal; Notable for the following components:   CO2 16 (*)    Glucose, Bld 109 (*)    BUN 34 (*)    Creatinine, Ser 5.10 (*)    Calcium 6.4 (*)    GFR, Estimated 10 (*)    All other components within normal limits  MAGNESIUM    EKG None  Radiology No results found.  Procedures Procedures    Medications Ordered in ED Medications  calcium gluconate 1 g/ 50 mL sodium chloride IVPB (has no administration in  time range)    ED Course/ Medical Decision Making/ A&P                                 Medical Decision Making 42 year old female here today with hypocalcemia.  Plan-will obtain CT imaging the patient's chest given the reported potential malignancy previously.  Will admit patient to hospitalist.  Amount and/or Complexity of Data Reviewed Labs: ordered. Radiology: ordered.  Risk Prescription drug management.           Final Clinical Impression(s) / ED Diagnoses Final diagnoses:  Hypocalcemia    Rx / DC Orders ED Discharge Orders     None         Arletha Pili, DO 09/29/22 2215

## 2022-09-29 NOTE — Assessment & Plan Note (Addendum)
On CMP today creatinine elevated at 5 with GFR of 10.  Patient was referred to nephrology, but has not had an appointment yet.  Given concern for hypocalcemia, patient was sent to the emergency department.  Patient can have inpatient nephrology consult to be plugged in with nephrology.  Of note, patient was to take sodium bicarb tablets, but never picked these up.  Bicarb is 16 on CMP today.  Plan: -Send patient to emergency department for hypocalcemia -Recommend inpatient nephrology consult

## 2022-09-29 NOTE — Progress Notes (Signed)
CC: Follow-up appointment for hypocalcemia  HPI:  [name] is a 42 y.o.-year-old female with a past medical history of hypertension, asthma, type 2 diabetes, CKD presents for concerns of lower extremity edema. Medications: Hypertension: hydralazine 50 mg every 8 hours, metoprolol succinate 50 mg daily GERD: Pantoprazole 40 mg daily  Past Medical History:  Diagnosis Date   Anxiety    Asthma    Depression    Diabetes mellitus    Hyperemesis arising during pregnancy    Hypertension    Mental disorder      Current Outpatient Medications:    acetaminophen (TYLENOL) 500 MG tablet, Take 1,000 mg by mouth every 6 (six) hours as needed for moderate pain., Disp: , Rfl:    hydrALAZINE (APRESOLINE) 50 MG tablet, Take 0.5 tablets (25 mg total) by mouth 3 (three) times daily., Disp: 90 tablet, Rfl: 11   metoprolol succinate (TOPROL-XL) 50 MG 24 hr tablet, Take 1 tablet (50 mg total) by mouth daily. Take with or immediately following a meal., Disp: 90 tablet, Rfl: 0   pantoprazole (PROTONIX) 40 MG tablet, Take 1 tablet (40 mg total) by mouth daily., Disp: 90 tablet, Rfl: 0   sodium bicarbonate 650 MG tablet, Take 1 tablet (650 mg total) by mouth 2 (two) times daily., Disp: 100 tablet, Rfl: 1  Review of Systems:    Negative except what is stated in HPI   Physical Exam:  Vitals:   09/29/22 1410 09/29/22 1440  BP: (!) 122/103 138/66  Pulse: 69 66  Temp: 98.1 F (36.7 C)   TempSrc: Oral   SpO2: 100%   Weight: 187 lb (84.8 kg)   Height: 5\' 7"  (1.702 m)     General: Patient is sitting comfortably in the room  Head: Normocephalic, atraumatic  Cardio: Regular rate and rhythm, no murmurs, rubs or gallops Pulmonary: Clear to ausculation bilaterally with no rales, rhonchi, and crackles   Assessment & Plan:   Hypertension Patient has a past medical history of hypertension.  Current regimen includes amlodipine 10 mg daily, hydralazine 50 mg every 8 hours, metoprolol succinate 50  mg daily.  Patient presents to clinic today with blood pressure of 122/103 and on recheck 138/66.  Given concerns for leg edema, will discontinue amlodipine at this time.   Plan: -Continue hydralazine 50 mg 3 times daily -Continue metoprolol succinate 50 mg daily -Discontinue amlodipine -The patient still needs blood pressure control, can increase hydralazine versus starting Imdur as renal function will not allow for ACE/ARB  Lung nodule seen on imaging study Patient had incidental lung nodule found on chest CT on previous admissions.  There is recommended that patient will need to follow this lung nodule up in 6-8 weeks.  Plan: -Will plan to send patient to the emergency department for hypocalcemia, can get chest x-ray there  Hypocalcemia Patient previously had BMP findings of severely low calcium.  Repeated CMP today showing severely decreased calcium still at 6.2.  Send the patient to the emergency department for further evaluation and management.  Patient will likely need admission.  This could be related to underlying renal disease, primary hypoparathyroidism, or could be related to hypercalciuria.  Plan: -Would like patient to be sent to the emergency department for admission -Can start calcium infusion -Would recommend trending calcium while inpatient -Would recommend getting EKG there as well -Would like to obtain PTH and urinary calcium while on patient as well  CKD (chronic kidney disease), stage IV (HCC) On CMP today creatinine elevated at 5  with GFR of 10.  Patient was referred to nephrology, but has not had an appointment yet.  Given concern for hypocalcemia, patient was sent to the emergency department.  Patient can have inpatient nephrology consult to be plugged in with nephrology.  Of note, patient was to take sodium bicarb tablets, but never picked these up.  Bicarb is 16 on CMP today.  Plan: -Send patient to emergency department for hypocalcemia -Recommend inpatient  nephrology consult  Patient discussed with Dr. Princella Pellegrini, DO PGY-2 Internal Medicine Resident  Pager: [phone]

## 2022-09-29 NOTE — Telephone Encounter (Signed)
Received a page from [facility] for a critical lab result.  CMP from clinic visit today, 09/29/2022, showed calcium of 6.4 with albumin of 3.5.  Discussed with ordering physician, Dr. Modena Slater, who advises referral to emergency department.  I called the patient and discussed the lab result and plan of reporting to the emergency department.  Her close as emergency department is [facility] emergency department.  She will come up as soon as she can.  Rocky Morel, DO Internal Medicine Resident, PGY-2 Pager# [phone] Please contact the on call pager after 5 pm and on weekends at [phone].

## 2022-09-29 NOTE — H&P (Incomplete)
Date: 09/29/2022               Patient Name:  [name] MRN: [id number]  DOB: Jul 13, 1980 Age / Sex: 42 y.o., female   PCP: Philomena Doheny, MD         Medical Service: Internal Medicine Teaching Service         Attending Physician: Dr. Reymundo Poll, MD    First Contact: Dr. Carmina Miller Pager: [phone]   Second Contact: Dr. Marrianne Mood Pager: [phone]        After Hours (After 5p/  First Contact Pager: [phone]  weekends / holidays): Second Contact Pager: [phone]   Chief Complaint: abnormal lab, low calcium  History of Present Illness:  [name] is 42 y.o. female with PMH of CKD IV, anemia of chronic disease, HTN, T2DM, GERD, asthma, and marijuana use who presented to [facility] for lower extremity swelling, noted to have critically low calcium (6.2) and advised further evaluation in ED and calcium repletion.    Patient reported intermittent tingling in 5th digit of the left hand for the past week. Endorses occasional muscle cramps throughout body in particularly legs, denies any worsening of muscle spasms. States she has felt an intermittent pain in her chest present for past few weeks but denies any associated heart racing/flutter, SOB, exacerbation with activity, or numbness/tingling in left arm. Is not experiencing CP currently. Patient also denies any perioral numbness, confusion, vision changes, headache, N/V, abdominal pain, constipation, or changes in urination. Patient also denies any recent signs of bleeding. She is still menstruating, denies heavy bleeding.   Patient reports her leg swelling began about a week ago and it was mostly around her ankles. Reports improvement over the week and believes they are almost back to baseline today. She is still voiding regularly. Has never been on dialysis. Was referred to nephrology but no one has followed up. Patient has been taking her HTN medications consistently including amlodipine 10 mg daily. She was advised to  discontinue taking amlodipine at today's clinic visit.   Discussed code status and patient elected FULL Code.   ED Course: CMP Ca 6.2 (corrected 6.6, albumin 3.5),Cr. 5.03 GFR 10; Ordered EKG and Chest CT; Ordered IV calcium gluconate 1 g x 1 dose. Patient on room air, BP elevated 151/87 otherwise hemodynamically stable.   Meds:  Patient reports she is taking the following medications consistently:  -hydralazine 50 mg TID -Toprol 50 mg daily -Protonix 40 mg daily -sodium bicarbonate 650 mg BID  No outpatient medications have been marked as taking for the 09/29/22 encounter [facility] Encounter).   Allergies: Allergies as of 09/29/2022 - Review Complete 09/29/2022  Allergen Reaction Noted  . Shellfish allergy Anaphylaxis and Swelling 04/05/2012  . Morphine and codeine Nausea And Vomiting 08/03/2010   PMH: Past Medical History:  Diagnosis Date  . Anxiety   . Asthma   . Depression   . Diabetes mellitus   . Hyperemesis arising during pregnancy   . Hypertension   . Mental disorder     Family History:  -No family hx of thyroid or parathyroid disease or cancer  -Mother had kidney disease, was on dialysis -Son is healthy  Family History  Problem Relation Age of Onset  . Heart disease Mother        CHF  . Diabetes Sister   . Anesthesia problems Neg Hx    Social History:  Lives: at home with her son.  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 Independent with ADLs and IADLs.  PCP: Colbert Coyer, Alyson Locket, MD Tobacco: denies Alcohol: denies Recreational Drug Use: occasional smoking marijuana (few times a week)  Review of Systems: A complete ROS was negative except as per HPI.   Physical Exam: Blood pressure (!) 151/87, pulse 65, temperature 98.4 F (36.9 C), temperature source Oral, resp. rate 18, height 5\' 7"  (1.702 m), weight 84.8 kg, SpO2 100%. General: Patient resting in bed comfortably, in no acute distress. Cooperative and able to ask and answer questions  appropriately.  CV: RRR, no m/r/g; upper and lower extremity pulses intact Pulmonary: Clear lungs bilaterally, normal respiratory effort on room air  Abdominal: Soft, non-tender, non-distended; positive bowel sounds MSK: Trace swelling in bilateral feet, no swelling above ankles noted Skin: Warm and dry  Neuro: Alert and oriented, no focal deficits noted Psych: Normal mood and affect  EKG: personally reviewed my interpretation is normal sinus rhythm, borderline QT prolongation (459 ms).  CT Chest WO Contrast: IMPRESSION:  1. No occult pulmonary or mediastinal mass identified. 2. Mild coronary artery calcification. 3. Small pericardial effusion. 4. Hypoattenuation of the cardiac blood pool in keeping with at least mild anemia.   Assessment & Plan by Problem: Principal Problem:   Hypocalcemia Patient with history of CKD IV, likely advanced to CKD V presenting with severely decreased calcium of 6.2, albumin 3.5, corrected calcium 6.6. Repeat BMP in ED calcium 6.4 (corrected 6.8), magnesium 1.7 WNL. Patient with Ca 6.2 on outpatient lab from 8/29, relatively asymptomatic, did not proceed with oral calcium repletion at that time.   Patient reports ongoing paresthesia/tingling in 5th digit of the left hand. Reports intermittent muscle cramping, but no more than usual. Endorses intermittent chest discomfort that seems more muscular in nature, as she denies any heart racing/flutter, tingling/numbing along left upper extremity, diaphoresis, or SOB. EKG NSR with borderline QT prolongation 459 ms. CT negative for pulmonary or mediastinal mass. Denies any current CP, perioral numbness, seizures, or confusion. No signs of tetany observed on physical exam.    Hypocalcemia could be related to underlying renal disease vs primary hypoparathyroidism vs hypercalciuria. Will plan to replete calcium and order further lab workup to help determine etiology of hypocalcemia. Nephrology made aware of patient and  will see in the morning, appreciate recommendations for management of hypocalcemia in the setting of worsening CKD.   Plan: -Start IV calcium infusion 1g -Follow up labs for RFP, PTH, urinary calcium, vitamin D, CBC -Trend Ca daily, reassess need for repeat calcium infusion vs oral calcium repletion   CKD (chronic kidney disease), stage IV  Anemia of chronic disease  Non gap acidosis   History of CKD IV likely progressed to CKD V as creatinine elevated at 5.03 with GFR of 10 and BUN 31 on CMP. Bicarb 16, Na 133, K 4.6. Anion gap 10 WNL.   Referred to nephrology at 8/13 clinic visit, has not been seen. At 8/13 visit, labs noted moderate hyperkalemia and a worsening non-gap acidosis, with patient reporting she started the bicarb to help preserve renal function; did not initiate the lokelma. Labs placed at 8/13 visit (urine protein electrophoresis, serum protein electrophoresis, urine immunofixation, kappa/lambda light chains) to help quantify proteinuria and rule out paraproteins; negative for blood dyscrasia.    T2DM  Patient with history of T2DM. Patient's insulin held after hospital discharge 08/13/22 for community acquired pneumonia. A1C 6.1% on 08/11/22. BMP glucose 109-110.  Plan:  -Monitor glucose and add SSI as needed.  Diet: renal with fluid restriction 1200 mL DVT Prophylaxis: Heparin injection 5,000 units sq   Prior to admission living arrangement: Home  Anticipated discharge location: Home Barriers to discharge: medical stabilization  Dispo: Admit patient to Observation with expected length of stay less than 2 midnights.  Signed: Philomena Doheny, MD 09/29/2022, 11:27 PM  After 5pm on weekdays and 1pm on weekends: On Call pager: [phone]

## 2022-09-29 NOTE — Assessment & Plan Note (Addendum)
Patient has a past medical history of hypertension.  Current regimen includes amlodipine 10 mg daily, hydralazine 50 mg every 8 hours, metoprolol succinate 50 mg daily.  Patient presents to clinic today with blood pressure of 122/103 and on recheck 138/66.  Given concerns for leg edema, will discontinue amlodipine at this time.   Plan: -Continue hydralazine 50 mg 3 times daily -Continue metoprolol succinate 50 mg daily -Discontinue amlodipine -The patient still needs blood pressure control, can increase hydralazine versus starting Imdur as renal function will not allow for ACE/ARB

## 2022-09-29 NOTE — Assessment & Plan Note (Signed)
Patient had incidental lung nodule found on chest CT on previous admissions.  There is recommended that patient will need to follow this lung nodule up in 6-8 weeks.  Plan: -Will plan to send patient to the emergency department for hypocalcemia, can get chest x-ray there

## 2022-09-30 ENCOUNTER — Other Ambulatory Visit: Payer: Self-pay | Admitting: Student

## 2022-09-30 DIAGNOSIS — E8809 Other disorders of plasma-protein metabolism, not elsewhere classified: Secondary | ICD-10-CM | POA: Diagnosis present

## 2022-09-30 DIAGNOSIS — E8721 Acute metabolic acidosis: Secondary | ICD-10-CM | POA: Diagnosis not present

## 2022-09-30 DIAGNOSIS — J45909 Unspecified asthma, uncomplicated: Secondary | ICD-10-CM | POA: Diagnosis present

## 2022-09-30 DIAGNOSIS — Z91013 Allergy to seafood: Secondary | ICD-10-CM | POA: Diagnosis not present

## 2022-09-30 DIAGNOSIS — N189 Chronic kidney disease, unspecified: Secondary | ICD-10-CM | POA: Diagnosis not present

## 2022-09-30 DIAGNOSIS — K219 Gastro-esophageal reflux disease without esophagitis: Secondary | ICD-10-CM | POA: Diagnosis present

## 2022-09-30 DIAGNOSIS — E559 Vitamin D deficiency, unspecified: Secondary | ICD-10-CM | POA: Diagnosis not present

## 2022-09-30 DIAGNOSIS — D508 Other iron deficiency anemias: Secondary | ICD-10-CM | POA: Diagnosis present

## 2022-09-30 DIAGNOSIS — N185 Chronic kidney disease, stage 5: Secondary | ICD-10-CM | POA: Diagnosis present

## 2022-09-30 DIAGNOSIS — E872 Acidosis, unspecified: Secondary | ICD-10-CM | POA: Diagnosis present

## 2022-09-30 DIAGNOSIS — Z794 Long term (current) use of insulin: Secondary | ICD-10-CM | POA: Diagnosis not present

## 2022-09-30 DIAGNOSIS — Z885 Allergy status to narcotic agent status: Secondary | ICD-10-CM | POA: Diagnosis not present

## 2022-09-30 DIAGNOSIS — E1122 Type 2 diabetes mellitus with diabetic chronic kidney disease: Secondary | ICD-10-CM | POA: Diagnosis present

## 2022-09-30 DIAGNOSIS — E1165 Type 2 diabetes mellitus with hyperglycemia: Secondary | ICD-10-CM | POA: Diagnosis present

## 2022-09-30 DIAGNOSIS — R202 Paresthesia of skin: Secondary | ICD-10-CM | POA: Diagnosis present

## 2022-09-30 DIAGNOSIS — Z833 Family history of diabetes mellitus: Secondary | ICD-10-CM | POA: Diagnosis not present

## 2022-09-30 DIAGNOSIS — D649 Anemia, unspecified: Secondary | ICD-10-CM | POA: Diagnosis not present

## 2022-09-30 DIAGNOSIS — Z87892 Personal history of anaphylaxis: Secondary | ICD-10-CM | POA: Diagnosis not present

## 2022-09-30 DIAGNOSIS — I3139 Other pericardial effusion (noninflammatory): Secondary | ICD-10-CM | POA: Diagnosis not present

## 2022-09-30 DIAGNOSIS — E875 Hyperkalemia: Secondary | ICD-10-CM | POA: Diagnosis present

## 2022-09-30 DIAGNOSIS — Z6829 Body mass index (BMI) 29.0-29.9, adult: Secondary | ICD-10-CM | POA: Diagnosis not present

## 2022-09-30 DIAGNOSIS — R630 Anorexia: Secondary | ICD-10-CM | POA: Diagnosis present

## 2022-09-30 DIAGNOSIS — Z8249 Family history of ischemic heart disease and other diseases of the circulatory system: Secondary | ICD-10-CM | POA: Diagnosis not present

## 2022-09-30 DIAGNOSIS — D631 Anemia in chronic kidney disease: Secondary | ICD-10-CM | POA: Diagnosis present

## 2022-09-30 DIAGNOSIS — M7989 Other specified soft tissue disorders: Secondary | ICD-10-CM | POA: Diagnosis not present

## 2022-09-30 DIAGNOSIS — I120 Hypertensive chronic kidney disease with stage 5 chronic kidney disease or end stage renal disease: Secondary | ICD-10-CM | POA: Diagnosis present

## 2022-09-30 DIAGNOSIS — Z841 Family history of disorders of kidney and ureter: Secondary | ICD-10-CM | POA: Diagnosis not present

## 2022-09-30 DIAGNOSIS — Z79899 Other long term (current) drug therapy: Secondary | ICD-10-CM | POA: Diagnosis not present

## 2022-09-30 LAB — RENAL FUNCTION PANEL
Albumin: 3.3 g/dL — ABNORMAL LOW (ref 3.5–5.0)
Anion gap: 18 — ABNORMAL HIGH (ref 5–15)
BUN: 36 mg/dL — ABNORMAL HIGH (ref 6–20)
CO2: 16 mmol/L — ABNORMAL LOW (ref 22–32)
Calcium: 7 mg/dL — ABNORMAL LOW (ref 8.9–10.3)
Chloride: 106 mmol/L (ref 98–111)
Creatinine, Ser: 4.92 mg/dL — ABNORMAL HIGH (ref 0.44–1.00)
GFR, Estimated: 11 mL/min — ABNORMAL LOW (ref >60)
Glucose, Bld: 100 mg/dL — ABNORMAL HIGH (ref 70–99)
Phosphorus: 5.1 mg/dL — ABNORMAL HIGH (ref 2.5–4.6)
Potassium: 4.6 mmol/L (ref 3.5–5.1)
Sodium: 140 mmol/L (ref 135–145)

## 2022-09-30 LAB — FOLATE: Folate: 4.6 ng/mL — ABNORMAL LOW (ref >5.9)

## 2022-09-30 LAB — RETICULOCYTES
Immature Retic Fract: 13.2 % (ref 2.3–15.9)
RBC.: 3.51 MIL/uL — ABNORMAL LOW (ref 3.87–5.11)
Retic Count, Absolute: 16.1 10*3/uL — ABNORMAL LOW (ref 19.0–186.0)
Retic Ct Pct: 0.5 % (ref 0.4–3.1)

## 2022-09-30 LAB — CALCIUM
Calcium: 6.8 mg/dL — ABNORMAL LOW (ref 8.9–10.3)
Calcium: 7.4 mg/dL — ABNORMAL LOW (ref 8.9–10.3)
Calcium: 7.1 mg/dL — ABNORMAL LOW (ref 8.9–10.3)

## 2022-09-30 LAB — CBC
HCT: 23.8 % — ABNORMAL LOW (ref 36.0–46.0)
Hemoglobin: 7.2 g/dL — ABNORMAL LOW (ref 12.0–15.0)
MCH: 22.6 pg — ABNORMAL LOW (ref 26.0–34.0)
MCHC: 30.3 g/dL (ref 30.0–36.0)
MCV: 74.8 fL — ABNORMAL LOW (ref 80.0–100.0)
Platelets: 264 10*3/uL (ref 150–400)
RBC: 3.18 MIL/uL — ABNORMAL LOW (ref 3.87–5.11)
RDW: 23.2 % — ABNORMAL HIGH (ref 11.5–15.5)
WBC: 6.2 10*3/uL (ref 4.0–10.5)
nRBC: 0 % (ref 0.0–0.2)

## 2022-09-30 LAB — GLUCOSE, CAPILLARY: Glucose-Capillary: 118 mg/dL — ABNORMAL HIGH (ref 70–99)

## 2022-09-30 LAB — IRON AND TIBC
Iron: 46 ug/dL (ref 28–170)
Saturation Ratios: 15 % (ref 10.4–31.8)
TIBC: 298 ug/dL (ref 250–450)
UIBC: 252 ug/dL

## 2022-09-30 LAB — FERRITIN: Ferritin: 35 ng/mL (ref 11–307)

## 2022-09-30 LAB — VITAMIN D 25 HYDROXY (VIT D DEFICIENCY, FRACTURES): Vit D, 25-Hydroxy: 10.44 ng/mL — ABNORMAL LOW (ref 30–100)

## 2022-09-30 LAB — VITAMIN B12: Vitamin B-12: 309 pg/mL (ref 180–914)

## 2022-09-30 MED ORDER — HYDRALAZINE HCL 50 MG PO TABS: 50.0000 mg | ORAL_TABLET | Freq: Three times a day (TID) | ORAL | Status: DC

## 2022-09-30 MED ORDER — CALCIUM CARBONATE ANTACID 500 MG PO CHEW
1.0000 | CHEWABLE_TABLET | Freq: Three times a day (TID) | ORAL | Status: DC
  Administered 2022-09-30 – 2022-10-01 (×3): 200 mg via ORAL
  Filled 2022-09-30 (×3): qty 1

## 2022-09-30 MED ORDER — CALCIUM GLUCONATE-NACL 1-0.675 GM/50ML-% IV SOLN
2.0000 g | Freq: Once | INTRAVENOUS | Status: AC
  Administered 2022-09-30: 2000 mg via INTRAVENOUS
  Filled 2022-09-30: qty 100

## 2022-09-30 MED ORDER — SODIUM CHLORIDE 0.9 % IV SOLN: 0.5000 mg/kg/h | INTRAVENOUS | Status: DC

## 2022-09-30 MED ORDER — PROCHLORPERAZINE EDISYLATE 10 MG/2ML IJ SOLN
10.0000 mg | Freq: Once | INTRAMUSCULAR | Status: AC
  Administered 2022-09-30: 10 mg via INTRAVENOUS
  Filled 2022-09-30: qty 2

## 2022-09-30 MED ORDER — INSULIN ASPART 100 UNIT/ML IJ SOLN: 0.0000 [IU] | Freq: Three times a day (TID) | INTRAMUSCULAR | Status: DC

## 2022-09-30 MED ORDER — CALCIUM GLUCONATE-NACL 1-0.675 GM/50ML-% IV SOLN
INTRAVENOUS | Status: AC
  Filled 2022-09-30: qty 50

## 2022-09-30 MED ORDER — FOLIC ACID 1 MG PO TABS
1.0000 mg | ORAL_TABLET | Freq: Every day | ORAL | Status: DC
  Administered 2022-09-30 – 2022-10-01 (×2): 1 mg via ORAL
  Filled 2022-09-30: qty 1

## 2022-09-30 MED ORDER — CLONAZEPAM 0.5 MG PO TABS
0.5000 mg | ORAL_TABLET | Freq: Once | ORAL | Status: AC
  Administered 2022-09-30: 0.5 mg via ORAL
  Filled 2022-09-30: qty 1

## 2022-09-30 MED ORDER — DARBEPOETIN ALFA 60 MCG/0.3ML IJ SOSY
60.0000 ug | PREFILLED_SYRINGE | Freq: Once | INTRAMUSCULAR | Status: AC
  Administered 2022-10-01: 60 ug via SUBCUTANEOUS
  Filled 2022-09-30: qty 0.3

## 2022-09-30 MED ORDER — INSULIN ASPART 100 UNIT/ML IJ SOLN: 3.0000 [IU] | Freq: Three times a day (TID) | INTRAMUSCULAR | Status: DC

## 2022-09-30 MED ORDER — SODIUM BICARBONATE 650 MG PO TABS
650.0000 mg | ORAL_TABLET | Freq: Two times a day (BID) | ORAL | Status: DC
  Administered 2022-09-30 – 2022-10-01 (×3): 650 mg via ORAL
  Filled 2022-09-30 (×3): qty 1

## 2022-09-30 MED ORDER — SODIUM CHLORIDE 0.9 % IV SOLN
250.0000 mg | Freq: Once | INTRAVENOUS | Status: AC
  Administered 2022-09-30: 250 mg via INTRAVENOUS
  Filled 2022-09-30: qty 20

## 2022-09-30 MED ORDER — ONDANSETRON 4 MG PO TBDP
4.0000 mg | ORAL_TABLET | Freq: Once | ORAL | Status: AC
  Administered 2022-09-30: 4 mg via ORAL
  Filled 2022-09-30: qty 1

## 2022-09-30 MED ORDER — VITAMIN D (ERGOCALCIFEROL) 1.25 MG (50000 UNIT) PO CAPS
50000.0000 [IU] | ORAL_CAPSULE | ORAL | Status: DC
  Administered 2022-09-30: 50000 [IU] via ORAL
  Filled 2022-09-30: qty 1

## 2022-09-30 MED ORDER — CALCITRIOL 0.25 MCG PO CAPS
0.2500 ug | ORAL_CAPSULE | Freq: Every day | ORAL | Status: DC
  Administered 2022-09-30 – 2022-10-01 (×2): 0.25 ug via ORAL
  Filled 2022-09-30 (×2): qty 1

## 2022-09-30 NOTE — ED Notes (Signed)
ED TO INPATIENT HANDOFF REPORT  ED Nurse Name and Phone #: Rodney Booze [phone]  S Name/Age/Gender [name] 42 y.o. female Room/Bed: 029C/029C  Code Status   Code Status: Full Code  Home/SNF/Other Home Patient oriented to: self, place, time, and situation Is this baseline? Yes   Triage Complete: Triage complete  Chief Complaint Hypocalcemia [E83.51]  Triage Note Pt states that she has had tingling in hands x 1 week. PMD called her today after finding Calcium level was low. (6.2) Level has been low for a couple of weeks. Denies any other physical symptoms at this time   Allergies Allergies  Allergen Reactions   Shellfish Allergy Anaphylaxis and Swelling   Morphine And Codeine Nausea And Vomiting    Level of Care/Admitting Diagnosis ED Disposition     ED Disposition  Admit   Condition  --   Comment  Hospital Area: [facility] [[id number]]  Level of Care: Telemetry Medical [104]  May place patient in observation at [facility] or [facility] if equivalent level of care is available:: No  Covid Evaluation: Asymptomatic - no recent exposure (last 10 days) testing not required  Diagnosis: Hypocalcemia [275.41.ICD-9-CM]  Admitting Physician: Reymundo Poll [[id number]]  Attending Physician: Reymundo Poll [[id number]]          B Medical/Surgery History Past Medical History:  Diagnosis Date   Anxiety    Asthma    Depression    Diabetes mellitus    Hyperemesis arising during pregnancy    Hypertension    Mental disorder    Past Surgical History:  Procedure Laterality Date   ESOPHAGOGASTRODUODENOSCOPY (EGD) WITH PROPOFOL N/A 08/07/2019   Procedure: ESOPHAGOGASTRODUODENOSCOPY (EGD) WITH PROPOFOL;  Surgeon: Hilarie Fredrickson, MD;  Location: [facility] ENDOSCOPY;  Service: Endoscopy;  Laterality: N/A;   NO PAST SURGERIES     TUBAL LIGATION  06/20/2011   Procedure: POST PARTUM TUBAL LIGATION;  Surgeon: Catalina Antigua, MD;  Location: [facility] ORS;  Service: Gynecology;   Laterality: Bilateral;  Bilateral post partum tubal ligation     A IV Location/Drains/Wounds Patient Lines/Drains/Airways Status     Active Line/Drains/Airways     Name Placement date Placement time Site Days   Peripheral IV 09/29/22 20 G 2.5" Right;Upper Arm 09/29/22  2342  Arm  1            Intake/Output Last 24 hours No intake or output data in the 24 hours ending 09/30/22 0034  Labs/Imaging Results for orders placed or performed during the hospital encounter of 09/29/22 (from the past 48 hour(s))  CBC with Differential     Status: Abnormal   Collection Time: 09/29/22  7:46 PM  Result Value Ref Range   WBC 5.7 4.0 - 10.5 K/uL   RBC 3.22 (L) 3.87 - 5.11 MIL/uL   Hemoglobin 7.4 (L) 12.0 - 15.0 g/dL    Comment: Reticulocyte Hemoglobin testing may be clinically indicated, consider ordering this additional test [id number]    HCT 24.6 (L) 36.0 - 46.0 %   MCV 76.4 (L) 80.0 - 100.0 fL   MCH 23.0 (L) 26.0 - 34.0 pg   MCHC 30.1 30.0 - 36.0 g/dL   RDW 88.4 (H) 16.6 - 06.3 %   Platelets 302 150 - 400 K/uL   nRBC 0.0 0.0 - 0.2 %   Neutrophils Relative % 52 %   Neutro Abs 3.0 1.7 - 7.7 K/uL   Lymphocytes Relative 37 %   Lymphs Abs 2.1 0.7 - 4.0 K/uL   Monocytes Relative 7 %  Monocytes Absolute 0.4 0.1 - 1.0 K/uL   Eosinophils Relative 3 %   Eosinophils Absolute 0.2 0.0 - 0.5 K/uL   Basophils Relative 1 %   Basophils Absolute 0.0 0.0 - 0.1 K/uL   WBC Morphology MORPHOLOGY UNREMARKABLE    RBC Morphology MORPHOLOGY UNREMARKABLE    Smear Review PLATELET COUNT CONFIRMED BY SMEAR    Immature Granulocytes 0 %   Abs Immature Granulocytes 0.02 0.00 - 0.07 K/uL    Comment: Performed at [facility], [address] [place], [place] [address]  Basic metabolic panel     Status: Abnormal   Collection Time: 09/29/22  7:46 PM  Result Value Ref Range   Sodium 136 135 - 145 mmol/L   Potassium 4.9 3.5 - 5.1 mmol/L   Chloride 108 98 - 111 mmol/L   CO2 16 (L) 22 - 32 mmol/L    Glucose, Bld 109 (H) 70 - 99 mg/dL    Comment: Glucose reference range applies only to samples taken after fasting for at least 8 hours.   BUN 34 (H) 6 - 20 mg/dL   Creatinine, Ser 6.04 (H) 0.44 - 1.00 mg/dL   Calcium 6.4 (LL) 8.9 - 10.3 mg/dL    Comment: CRITICAL RESULT CALLED TO, READ BACK BY AND VERIFIED WITH E PRYOR RN 09/29/2022 2033 BNUNNERY   GFR, Estimated 10 (L) >60 mL/min    Comment: (NOTE) Calculated using the CKD-EPI Creatinine Equation (2021)    Anion gap 12 5 - 15    Comment: Performed at [facility], [address] [place], [place] [address]  Magnesium     Status: None   Collection Time: 09/29/22  7:46 PM  Result Value Ref Range   Magnesium 1.7 1.7 - 2.4 mg/dL    Comment: Performed at [facility], [address], [place], [place] [address]   CT Chest Wo Contrast  Result Date: 09/29/2022 CLINICAL DATA:  Hypokalemia, upper extremity paresthesia, paraneoplastic syndrome. EXAM: CT CHEST WITHOUT CONTRAST TECHNIQUE: Multidetector CT imaging of the chest was performed following the standard protocol without IV contrast. RADIATION DOSE REDUCTION: This exam was performed according to the departmental dose-optimization program which includes automated exposure control, adjustment of the mA and/or kV according to patient size and/or use of iterative reconstruction technique. COMPARISON:  None Available. FINDINGS: Cardiovascular: Mild coronary artery calcification. Global cardiac size is within normal limits. Small pericardial effusion. Hypoattenuation of the cardiac blood pool is in keeping with at least mild anemia. Central pulmonary arteries are of normal caliber. Thoracic aorta is unremarkable. Mediastinum/Nodes: No enlarged mediastinal or axillary lymph nodes. Thyroid gland, trachea, and esophagus demonstrate no significant findings. Lungs/Pleura: Minimal scarring within the superior segment of the left lower lobe. Lungs are otherwise clear. No pneumothorax or pleural  effusion. Upper Abdomen: No acute abnormality. Musculoskeletal: No chest wall mass or suspicious bone lesions identified. IMPRESSION: 1. No occult pulmonary or mediastinal mass identified. 2. Mild coronary artery calcification. 3. Small pericardial effusion. 4. Hypoattenuation of the cardiac blood pool in keeping with at least mild anemia. Electronically Signed   By: Helyn Numbers M.D.   On: 09/29/2022 22:14    Pending Labs Unresulted Labs (From admission, onward)     Start     Ordered   09/30/22 0500  CBC  Tomorrow morning,   R        09/29/22 2305   09/30/22 0500  Renal function panel  Tomorrow morning,   R  09/29/22 2319   09/30/22 0500  Parathyroid hormone, intact (no Ca)  Tomorrow morning,   R        09/29/22 2320   09/30/22 0500  VITAMIN D 25 Hydroxy (Vit-D Deficiency, Fractures)  Tomorrow morning,   R        09/29/22 2321            Vitals/Pain Today's Vitals   09/29/22 1858 09/29/22 1911 09/29/22 2126 09/30/22 0012  BP: (!) 151/87     Pulse: 65     Resp: 18     Temp: 98.4 F (36.9 C)   98.5 F (36.9 C)  TempSrc: Oral   Oral  SpO2: 100%     Weight:   84.8 kg   Height:   5\' 7"  (1.702 m)   PainSc:  6       Isolation Precautions No active isolations  Medications Medications  calcium gluconate 1 g/ 50 mL sodium chloride IVPB (1,000 mg Intravenous New Bag/Given 09/30/22 0032)  senna-docusate (Senokot-S) tablet 1 tablet (has no administration in time range)  acetaminophen (TYLENOL) tablet 650 mg (has no administration in time range)    Or  acetaminophen (TYLENOL) suppository 650 mg (has no administration in time range)  heparin injection 5,000 Units (5,000 Units Subcutaneous Given 09/30/22 0028)  metoprolol succinate (TOPROL-XL) 24 hr tablet 50 mg (has no administration in time range)  hydrALAZINE (APRESOLINE) tablet 50 mg (50 mg Oral Given 09/30/22 0033)  pantoprazole (PROTONIX) EC tablet 40 mg (has no administration in time range)  sodium bicarbonate tablet  650 mg (has no administration in time range)    Mobility walks     Focused Assessments Cardiac Assessment Handoff:    Lab Results  Component Value Date   CKTOTAL 103 09/03/2010   CKMB 2.0 09/03/2010   TROPONINI <0.30 09/03/2010   Lab Results  Component Value Date   DDIMER 1.54 (H) 08/25/2018   Does the Patient currently have chest pain? No    R Recommendations: See Admitting Provider Note  Report given to:   Additional Notes:

## 2022-09-30 NOTE — Hospital Course (Addendum)
  9/12 Has a HA  Anxious  Swelling since she started the medication dc yesterday (amlodipine)    9/13 Paresthesias better  Weakness no  Back hurts from bed  Amlodipine since her last admission - no swelling until she took med. Improved since discontinued med

## 2022-09-30 NOTE — Progress Notes (Addendum)
Subjective Patient states she "does not feel good" and endorses headache and anxiety. Patient asking to eat.  Physical exam Blood pressure (!) 156/96, pulse 72, temperature (!) 97.5 F (36.4 C), resp. rate 12, height 5\' 7"  (1.702 m), weight 84.8 kg, SpO2 100%.  Physical Exam: Constitutional: lying in bed, anxious, in no acute distress Cardiovascular: regular rate and rhythm, no m/r/g, no LEE Pulmonary/Chest: normal work of breathing on room air, lungs clear to auscultation bilaterally Abdominal: soft, non-tender, non-distended Psych: anxious mood   Weight change:    Intake/Output Summary (Last 24 hours) at 09/30/2022 1048 Last data filed at 09/30/2022 0121 Gross per 24 hour  Intake 50 ml  Output --  Net 50 ml   Net IO Since Admission: 50 mL [09/30/22 1048]  Labs, images, and other studies    Latest Ref Rng & Units 09/30/2022    1:32 AM 09/29/2022    7:46 PM 09/29/2022    3:32 PM  BMP  Glucose 70 - 99 mg/dL 098  119  147   BUN 6 - 20 mg/dL 36  34  31   Creatinine 0.44 - 1.00 mg/dL 8.29  5.62  1.30   Sodium 135 - 145 mmol/L 140  136  133   Potassium 3.5 - 5.1 mmol/L 4.6  4.9  4.6   Chloride 98 - 111 mmol/L 106  108  107   CO2 22 - 32 mmol/L 16  16  16    Calcium 8.9 - 10.3 mg/dL 8.9 - 86.5 mg/dL 7.0    6.8  6.4  6.2        Latest Ref Rng & Units 09/30/2022    1:32 AM 09/29/2022    7:46 PM 08/31/2022    4:20 PM  CBC  WBC 4.0 - 10.5 K/uL 6.2  5.7  5.9   Hemoglobin 12.0 - 15.0 g/dL 7.2  7.4  8.5   Hematocrit 36.0 - 46.0 % 23.8  24.6  29.2   Platelets 150 - 400 K/uL 264  302  428     Assessment and plan Hospital day 1  [name] is a 42 y.o.female with PMH of CKD IV/V, anemia of chronic disease, HTN, T2DM, GERD, asthma, and marijuana use who presented to [facility] for lower extremity swelling, noted to have low calcium (6.2) and advised further evaluation in ED.  Principal Problem:   Hypocalcemia Active Problems:   Type 2 diabetes mellitus (HCC)    Hypertension   CKD (chronic kidney disease), stage IV (HCC)  #CKD IV/V #Hypocalcemia #Vitamin D deficiency  #AGMA Patient with history of CKD IV/V. Calcium on admission was 6.2. Patient given IV Calcium Gluconate 2 g. Repeat RFP this morning showed calcium of 7.0. ECG with NSR. Today patient denies any current paresthesias/cramping/weakness. Vitamin D is 10.44. Phosphorous is 5.1. Mg wnl. Suspect this is related to advancement of CKD. Nephrology consulted. - S/p 2g IV calcium  -Order Vitamin D 50,000 units -Urinary calcium pending -Ionized calcium pending -PTH pending -Trend RFP -Appreciate Nephrology recommendations  Anemia multifactorial 2/2 to CKD and Iron Deficiency  Hgb is 7.2 (BL ~ 8), MCV of 74.8. Patient denies hematuria/hematochezia/melena. Anemia panel from July with mixed picture of IDA and ACD. -Order Anemia Panel will f/u  -Order Ferrlecit 250 -Trend CBC  Anion gap on admission was normal. Repeat RFP with AG of 18. Bicarb 16 with both. CBG's normal. Unclear cause, patient did receive Calcium  Gluconate in between aforementioned labs. -Continue NaHCO3 -Trend RFP  #HTN BP elevated at 158/70. Patient endorses HA but asymptomatic otherwise. Notably, patient is anxious and frustrated with being in ED. Current home regimen includes Amlodipine 10 mg/daily, Hydralazine 50 mg TID, Toprol-XL 50 mg/daily. Amlodipine was discontinued at clinic appointment yesterday as it may have been contributing to leg swelling.  -Continue Hydralazine 50 mg TID -Continue Toprol-XL 50 mg/daily -Trend BP   T2DM  A1C 6.1% on 08/11/22. Not currently on medications. -Trend CBG -Renal/Carb modified diet  #Anxiety Patient states "my anxiety is high" during examination. No prior history with chronic anxiety. Patient frustrated with being in the ED and not having a meal.  -Klonopin 0.5 given -Re-assess  #GERD -Continue home Protonix   Diet: Renal/Carb modified with fluid restriction VTE:  heparin injection 5,000 Units Start: 09/29/22 2315  Code: Full  Discharge plan: Pending further evaluation   Carmina Miller, DO 09/30/2022, 10:48 AM  Pager: [phone] After 5pm or weekend: [phone]

## 2022-09-30 NOTE — Consult Note (Signed)
[facility] Renal Consultation Note  Requesting MD: Dr. Antony Contras Indication for Consultation:  hypocalcemia and CKD IV  HPI:  [name] is a 42 y.o. female Long standing uncontrolled HTN, T2DM previously on insulin now diet controlled, GERD, CKD IV, chronic microcytic anemia, vitamin D deficiency presenting to the hospital after critical labs for abnormally low calcium outpatient admitted for symptomatic hypocalcemia  Patient reports intermittent tingling in the the bilateral upper extremities and cramping for weeks. No perioral numbness, changes in her mentation, GI abnormalities, chest or respiratory discomfort. She eats about a meal per day with intermittent anorexia.   She has long standing history of hypertension diabetes for more than 20 years. Recently, her diabetes has been controlled without medications, but her hypertension has been fluctuating. She has been consistently taking Toprol XL and Hydralazine 50 mg TID. Amlodipine has been recently discontinued in the setting of lower extremity edema.   She has been asymptomatic. [name] reports that she has know about her history of kidney disease for years, which was initially attributed because her HTN and diabetes. She has not carried a diagnosis of hepatitis, does not consume alcohol or tobacco products, or uses IV drugs. She has not had changes in her urination, in volume or frequency. No dysuria, lower back pain.   She also has a recent history of hospital admission for chest discomfort, during wich she was found to have severely elevated blood pressures, pneumonia and acute on chronic anemia. She received IV iron and and one unit of pRBC. At the time, she was suspected to have progression of CKD IV to V. US renal ultrasound during 07/2022 admission showed no obstruction and echogenic, small kidneys. She is now a established patient at [facility], where she was evaluated, referred to nephrology, and started on  lokelma for hyperkalemia.  Since admission, patient has been doing well. She has been hypertensive, otherwise stable. For her hypocalcemia, she is now s/p 2g of IV calcium. She has also been started on vitamin D supplementation, and continued on sodium bicarbonate and Lokelma for her electrolyte abnormalities.  During our discussion, she shared with me she suspects she will need dialysis soon. She is familiar for the procedure as her mother used to receive RRT, though she does not know the culprit of her mother's disease. No other family member with kidney disease known to the patient.  Patient is currently on her menstrual period, which is regular and about 5 days per cycle.  Creatinine  Date/Time Value Ref Range Status  06/15/2011 10:55 PM 1.15 (H) 0.50 - 1.10 mg/dL Final   Creat  Date/Time Value Ref Range Status  06/17/2011 10:51 AM 1.24 (H) 0.50 - 1.10 mg/dL Final   Creatinine, Ser  Date/Time Value Ref Range Status  09/30/2022 01:32 AM 4.92 (H) 0.44 - 1.00 mg/dL Final  16/10/9602 54:09 PM 5.10 (H) 0.44 - 1.00 mg/dL Final  81/19/1478 29:56 PM 5.03 (H) 0.44 - 1.00 mg/dL Final  21/30/8657 84:69 PM 4.61 (H) 0.57 - 1.00 mg/dL Final  62/95/2841 32:44 PM 5.14 (H) 0.57 - 1.00 mg/dL Final  01/20/7251 66:44 AM 4.75 (H) 0.44 - 1.00 mg/dL Final  03/47/4259 56:38 AM 5.02 (H) 0.44 - 1.00 mg/dL Final  75/64/3329 51:88 AM 4.94 (H) 0.44 - 1.00 mg/dL Final  41/66/0630 16:01 AM 3.63 (H) 0.44 - 1.00 mg/dL Final  09/32/3557 32:20 AM 3.40 (H) 0.44 - 1.00 mg/dL Final  25/42/7062 37:62 PM 3.32 (H) 0.44 - 1.00 mg/dL Final  83/15/1761 60:73  AM 3.39 (H) 0.44 - 1.00 mg/dL Final    Comment:    DELTA CHECK NOTED  09/24/2020 02:57 AM 3.29 (H) 0.44 - 1.00 mg/dL Final  44/03/4740 59:56 AM 3.44 (H) 0.44 - 1.00 mg/dL Final  38/75/6433 29:51 AM 3.49 (H) 0.44 - 1.00 mg/dL Final  88/41/6606 30:16 AM 3.02 (H) 0.44 - 1.00 mg/dL Final  01/26/3233 57:32 AM 3.44 (H) 0.44 - 1.00 mg/dL Final  20/25/4270 62:37 AM 4.54  (H) 0.44 - 1.00 mg/dL Final  62/83/1517 61:60 PM 5.46 (H) 0.44 - 1.00 mg/dL Final  73/71/0626 94:85 AM 2.62 (H) 0.44 - 1.00 mg/dL Final  46/27/0350 09:38 AM 2.93 (H) 0.44 - 1.00 mg/dL Final  18/29/9371 69:67 AM 2.91 (H) 0.44 - 1.00 mg/dL Final  89/38/1017 51:02 AM 3.05 (H) 0.44 - 1.00 mg/dL Final  58/52/7782 42:35 AM 2.75 (H) 0.44 - 1.00 mg/dL Final  36/14/4315 40:08 AM 2.82 (H) 0.44 - 1.00 mg/dL Final  67/61/9509 32:67 PM 3.18 (H) 0.44 - 1.00 mg/dL Final  12/45/8099 83:38 AM 2.70 (H) 0.44 - 1.00 mg/dL Final  25/05/3974 73:41 AM 2.38 (H) 0.44 - 1.00 mg/dL Final  93/79/0240 97:35 PM 2.29 (H) 0.44 - 1.00 mg/dL Final  32/99/2426 83:41 AM 3.03 (H) 0.44 - 1.00 mg/dL Final  96/22/2979 89:21 PM 2.66 (H) 0.44 - 1.00 mg/dL Final  19/41/7408 14:48 AM 1.76 (H) 0.44 - 1.00 mg/dL Final  18/56/3149 70:26 AM 2.03 (H) 0.44 - 1.00 mg/dL Final  37/85/8850 27:74 AM 1.33 (H) 0.44 - 1.00 mg/dL Final  12/87/8676 72:09 AM 1.63 (H) 0.44 - 1.00 mg/dL Final  47/09/6281 66:29 AM 1.86 (H) 0.44 - 1.00 mg/dL Final  47/65/4650 35:46 AM 2.16 (H) 0.44 - 1.00 mg/dL Final  56/81/2751 70:01 PM 2.20 (H) 0.44 - 1.00 mg/dL Final  74/94/4967 59:16 AM 1.45 (H) 0.50 - 1.10 mg/dL Final  38/46/6599 35:70 AM 1.37 (H) 0.50 - 1.10 mg/dL Final  17/79/3903 00:92 AM 1.23 (H) 0.50 - 1.10 mg/dL Final  33/00/7622 63:33 PM 1.29 (H) 0.50 - 1.10 mg/dL Final  54/56/2563 89:37 PM 1.20 (H) 0.50 - 1.10 mg/dL Final  34/28/7681 15:72 AM 1.21 (H) 0.50 - 1.10 mg/dL Final  62/03/5595 41:63 PM 1.30 (H) 0.50 - 1.10 mg/dL Final  84/53/6468 03:21 AM 1.50 (H) 0.50 - 1.10 mg/dL Final  22/48/2500 37:04 PM 1.18 (H) 0.50 - 1.10 mg/dL Final  88/89/1694 50:38 AM 1.40 (H) 0.50 - 1.10 mg/dL Final  88/28/0034 91:79 AM 1.15 (H) 0.50 - 1.10 mg/dL Final  15/05/6977 48:01 AM 1.15 (H) 0.50 - 1.10 mg/dL Final  65/53/7482 70:78 PM 1.38 (H) 0.50 - 1.10 mg/dL Final  67/54/4920 10:07 AM 1.19 (H) 0.50 - 1.10 mg/dL Final     PMHx:   Past Medical History:  Diagnosis  Date   Anxiety    Asthma    Depression    Diabetes mellitus    Hyperemesis arising during pregnancy    Hypertension    Mental disorder     Past Surgical History:  Procedure Laterality Date   ESOPHAGOGASTRODUODENOSCOPY (EGD) WITH PROPOFOL N/A 08/07/2019   Procedure: ESOPHAGOGASTRODUODENOSCOPY (EGD) WITH PROPOFOL;  Surgeon: Hilarie Fredrickson, MD;  Location: [facility] ENDOSCOPY;  Service: Endoscopy;  Laterality: N/A;   NO PAST SURGERIES     TUBAL LIGATION  06/20/2011   Procedure: POST PARTUM TUBAL LIGATION;  Surgeon: Catalina Antigua, MD;  Location: [facility] ORS;  Service: Gynecology;  Laterality: Bilateral;  Bilateral post partum tubal ligation    Family Hx:  Family History  Problem Relation Age  of Onset   Heart disease Mother        CHF   Diabetes Sister    Anesthesia problems Neg Hx     Social History:  reports that she has never smoked. She has never used smokeless tobacco. She reports that she does not drink alcohol and does not use drugs.  Allergies:  Allergies  Allergen Reactions   Shellfish Allergy Anaphylaxis and Swelling   Morphine And Codeine Nausea And Vomiting    Medications: Prior to Admission medications   Medication Sig Start Date End Date Taking? Authorizing Provider  acetaminophen (TYLENOL) 500 MG tablet Take 1,000 mg by mouth every 6 (six) hours as needed for moderate pain.   Yes [provider]  hydrALAZINE (APRESOLINE) 50 MG tablet Take 0.5 tablets (25 mg total) by mouth 3 (three) times daily. Patient taking differently: Take 50 mg by mouth 3 (three) times daily. 09/29/22 09/29/23 Yes Patel, Azucena Cecil, DO  metoprolol succinate (TOPROL-XL) 50 MG 24 hr tablet Take 1 tablet (50 mg total) by mouth daily. Take with or immediately following a meal. 08/14/22  Yes Vann, Jessica U, DO  pantoprazole (PROTONIX) 40 MG tablet Take 1 tablet (40 mg total) by mouth daily. 08/14/22  Yes Vann, Jessica U, DO  sodium bicarbonate 650 MG tablet Take 1 tablet (650 mg total) by mouth 2 (two) times  daily. 09/01/22 09/01/23 Yes Tyson Alias, MD    Prior to Admission:  Medications Prior to Admission  Medication Sig Dispense Refill Last Dose   acetaminophen (TYLENOL) 500 MG tablet Take 1,000 mg by mouth every 6 (six) hours as needed for moderate pain.   unk   hydrALAZINE (APRESOLINE) 50 MG tablet Take 0.5 tablets (25 mg total) by mouth 3 (three) times daily. 90 tablet 11 09/29/2022   metoprolol succinate (TOPROL-XL) 50 MG 24 hr tablet Take 1 tablet (50 mg total) by mouth daily. Take with or immediately following a meal. 90 tablet 0 09/29/2022   pantoprazole (PROTONIX) 40 MG tablet Take 1 tablet (40 mg total) by mouth daily. 90 tablet 0 09/29/2022   sodium bicarbonate 650 MG tablet Take 1 tablet (650 mg total) by mouth 2 (two) times daily. (Patient taking differently: Take 650 mg by mouth daily.) 100 tablet 1 09/29/2022   Scheduled:  calcitRIOL  0.25 mcg Oral Daily   calcium carbonate  1 tablet Oral TID BM   [START ON 10/01/2022] darbepoetin (ARANESP) injection - NON-DIALYSIS  60 mcg Subcutaneous Once   folic acid  1 mg Oral Daily   heparin  5,000 Units Subcutaneous Q8H   hydrALAZINE  50 mg Oral TID   metoprolol succinate  50 mg Oral Daily   pantoprazole  40 mg Oral Daily   sodium bicarbonate  650 mg Oral BID   Vitamin D (Ergocalciferol)  50,000 Units Oral Once per day on Monday Thursday   Continuous:  calcium gluconate in NaCl      Labs:  Results for orders placed or performed during the hospital encounter of 09/29/22 (from the past 48 hour(s))  CBC with Differential     Status: Abnormal   Collection Time: 09/29/22  7:46 PM  Result Value Ref Range   WBC 5.7 4.0 - 10.5 K/uL   RBC 3.22 (L) 3.87 - 5.11 MIL/uL   Hemoglobin 7.4 (L) 12.0 - 15.0 g/dL    Comment: Reticulocyte Hemoglobin testing may be clinically indicated, consider ordering this additional test [id number]    HCT 24.6 (L) 36.0 - 46.0 %   MCV  76.4 (L) 80.0 - 100.0 fL   MCH 23.0 (L) 26.0 - 34.0 pg   MCHC 30.1 30.0  - 36.0 g/dL   RDW 16.1 (H) 09.6 - 04.5 %   Platelets 302 150 - 400 K/uL   nRBC 0.0 0.0 - 0.2 %   Neutrophils Relative % 52 %   Neutro Abs 3.0 1.7 - 7.7 K/uL   Lymphocytes Relative 37 %   Lymphs Abs 2.1 0.7 - 4.0 K/uL   Monocytes Relative 7 %   Monocytes Absolute 0.4 0.1 - 1.0 K/uL   Eosinophils Relative 3 %   Eosinophils Absolute 0.2 0.0 - 0.5 K/uL   Basophils Relative 1 %   Basophils Absolute 0.0 0.0 - 0.1 K/uL   WBC Morphology MORPHOLOGY UNREMARKABLE    RBC Morphology MORPHOLOGY UNREMARKABLE    Smear Review PLATELET COUNT CONFIRMED BY SMEAR    Immature Granulocytes 0 %   Abs Immature Granulocytes 0.02 0.00 - 0.07 K/uL    Comment: Performed at [facility], [address] [place], [place] [address]  Basic metabolic panel     Status: Abnormal   Collection Time: 09/29/22  7:46 PM  Result Value Ref Range   Sodium 136 135 - 145 mmol/L   Potassium 4.9 3.5 - 5.1 mmol/L   Chloride 108 98 - 111 mmol/L   CO2 16 (L) 22 - 32 mmol/L   Glucose, Bld 109 (H) 70 - 99 mg/dL    Comment: Glucose reference range applies only to samples taken after fasting for at least 8 hours.   BUN 34 (H) 6 - 20 mg/dL   Creatinine, Ser 1.91 (H) 0.44 - 1.00 mg/dL   Calcium 6.4 (LL) 8.9 - 10.3 mg/dL    Comment: CRITICAL RESULT CALLED TO, READ BACK BY AND VERIFIED WITH E PRYOR RN 09/29/2022 2033 BNUNNERY   GFR, Estimated 10 (L) >60 mL/min    Comment: (NOTE) Calculated using the CKD-EPI Creatinine Equation (2021)    Anion gap 12 5 - 15    Comment: Performed at [facility], [address], [place], [place] [address]  Magnesium     Status: None   Collection Time: 09/29/22  7:46 PM  Result Value Ref Range   Magnesium 1.7 1.7 - 2.4 mg/dL    Comment: Performed at [facility], [address], [place], [place] [address]  CBC     Status: Abnormal   Collection Time: 09/30/22  1:32 AM  Result Value Ref Range   WBC 6.2 4.0 - 10.5 K/uL   RBC 3.18 (L) 3.87 - 5.11 MIL/uL   Hemoglobin 7.2 (L) 12.0 -  15.0 g/dL    Comment: Reticulocyte Hemoglobin testing may be clinically indicated, consider ordering this additional test [id number]    HCT 23.8 (L) 36.0 - 46.0 %   MCV 74.8 (L) 80.0 - 100.0 fL   MCH 22.6 (L) 26.0 - 34.0 pg   MCHC 30.3 30.0 - 36.0 g/dL   RDW 96.2 (H) 95.2 - 84.1 %   Platelets 264 150 - 400 K/uL    Comment: REPEATED TO VERIFY   nRBC 0.0 0.0 - 0.2 %    Comment: Performed at [facility], [address] [place], [place] [address]  Renal function panel     Status: Abnormal   Collection Time: 09/30/22  1:32 AM  Result Value Ref Range   Sodium 140 135 - 145 mmol/L   Potassium 4.6 3.5 - 5.1 mmol/L   Chloride 106  98 - 111 mmol/L   CO2 16 (L) 22 - 32 mmol/L   Glucose, Bld 100 (H) 70 - 99 mg/dL    Comment: Glucose reference range applies only to samples taken after fasting for at least 8 hours.   BUN 36 (H) 6 - 20 mg/dL   Creatinine, Ser 4.09 (H) 0.44 - 1.00 mg/dL   Calcium 7.0 (L) 8.9 - 10.3 mg/dL   Phosphorus 5.1 (H) 2.5 - 4.6 mg/dL   Albumin 3.3 (L) 3.5 - 5.0 g/dL   GFR, Estimated 11 (L) >60 mL/min    Comment: (NOTE) Calculated using the CKD-EPI Creatinine Equation (2021)    Anion gap 18 (H) 5 - 15    Comment: Performed at [facility], [address] [place], [place] [address]  VITAMIN D 25 Hydroxy (Vit-D Deficiency, Fractures)     Status: Abnormal   Collection Time: 09/30/22  1:32 AM  Result Value Ref Range   Vit D, 25-Hydroxy 10.44 (L) 30 - 100 ng/mL    Comment: (NOTE) Vitamin D deficiency has been defined by the Institute of Medicine  and an Endocrine Society practice guideline as a level of serum 25-OH  vitamin D less than 20 ng/mL (1,2). The Endocrine Society went on to  further define vitamin D insufficiency as a level between 21 and 29  ng/mL (2).  1. IOM (Institute of Medicine). 2010. Dietary reference intakes for  calcium and D. [place]: [employer]. 2. Holick MF, Binkley Hornell, Bischoff-Ferrari HA, et al. Evaluation,   treatment, and prevention of vitamin D deficiency: an Endocrine  Society clinical practice guideline, JCEM. 2011 Jul; 96(7): 1911-30.  Performed at [facility], [address] [place], [place] [address]   Calcium     Status: Abnormal   Collection Time: 09/30/22  1:32 AM  Result Value Ref Range   Calcium 6.8 (L) 8.9 - 10.3 mg/dL    Comment: Performed at [facility], [address] [place], [place] [address]  Calcium     Status: Abnormal   Collection Time: 09/30/22 11:12 AM  Result Value Ref Range   Calcium 7.4 (L) 8.9 - 10.3 mg/dL    Comment: Performed at [facility], [address] [place], [place] [address]  Vitamin B12     Status: None   Collection Time: 09/30/22 11:12 AM  Result Value Ref Range   Vitamin B-12 309 180 - 914 pg/mL    Comment: (NOTE) This assay is not validated for testing neonatal or myeloproliferative syndrome specimens for Vitamin B12 levels. Performed at [facility], [address], [place], [place] [address]   Folate     Status: Abnormal   Collection Time: 09/30/22 11:12 AM  Result Value Ref Range   Folate 4.6 (L) >5.9 ng/mL    Comment: Performed at [facility], [address], [place], [place] [address]  Iron and TIBC     Status: None   Collection Time: 09/30/22 11:12 AM  Result Value Ref Range   Iron 46 28 - 170 ug/dL   TIBC 413 244 - 010 ug/dL   Saturation Ratios 15 10.4 - 31.8 %   UIBC 252 ug/dL    Comment: Performed at [facility], [address], [place], [place] [address]  Ferritin     Status: None   Collection Time: 09/30/22 11:12 AM  Result Value Ref Range   Ferritin 35 11 - 307 ng/mL  Comment: Performed at [facility], [address] [place], [place] [address]  Reticulocytes     Status: Abnormal   Collection Time: 09/30/22 11:12 AM  Result Value Ref Range   Retic Ct Pct 0.5 0.4 - 3.1 %   RBC. 3.51 (L) 3.87 - 5.11 MIL/uL   Retic Count, Absolute 16.1 (L) 19.0 - 186.0 K/uL    Immature Retic Fract 13.2 2.3 - 15.9 %    Comment: Performed at [facility], [address] [place], [place] [address]     ROS:  Pertinent items are noted in HPI.  Physical Exam: Vitals:   09/30/22 0800 09/30/22 1015  BP: (!) 156/96 (!) 158/70  Pulse: 72 72  Resp:  15  Temp:  97.9 F (36.6 C)  SpO2: 100% 100%     General: Middle age woman sitting in bed in NAD HEENT: Anicteric eyes, moist mucus membranes Neck: no JVP or neck distensions. No palpable masses or LAD Heart: RRR without murmurs or gallops. 2+ Radial, PT and DP pulses bilaterally Lungs: Speaking in full sentences, without increased WOB, CTAB without rales or crackles Abdomen: Soft, non tender, non distended. No CVA or suprapubic tenderness Extremities: warm and dry. No peripheral edema noted. No rashes, ecchymoses. Neuro: Alert and oriented, responding to questions appropriately  Assessment/Plan:  Symptomatic hypocalcemia Severe vitamin D deficiency Acute on chronic. PTH is still pending, however, high suspicion this is in the setting of PTH resistance secondary to CKD and and severely decreased vitamin D. No EKG changes at this time. S/p 2g of IV calcium. Repeat corrected calcium 7.6 today -Follow up PTH -Calcium carbonate 500 mg TID between meals -Start on 50,000 units vitamin D weekly for 7 weeks -> recheck levels to assess further supplementation needs -Start calcitriol 0.25 mcg daily  CKD V Severe vitamin D deficiency Metabolic acidosis Etiology likely in the setting of long standing uncontrolled hypertension. Patient had been lost to follow up until 07/2022 admission to [facility]. Recently referred to [facility] offices for progression to CKD V and HD planning -Admission C 5.06 and GFR 10. Bicarb 16 -Currently making urine, though not measured output.  -No overt evidence of volume overload -Will continue on Lokelma for now and continue monitoring phosphorus. May need binder if persistently elevated. 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 07/2022 admission with Sat ratio of 7 s/p 1 unit pRBC and IV iron supplementation. This admission Hgb 7.4-7.4 MCV 74.8. Elevated RDW. She is currently menstruating -Studies this admission with ferritin of 35, iron saturation of 15. -Replete with IV iron of at least 600 mg during this admission; currently receiving Ferrlicit 250mg  IV -Will start Eranesp  60 mcg weekly -Goal Hgb 10  Uncontrolled HTN - previously on amlodipine. Restarted on home hydralazine 50 mg TID, Toprol XL 50 mg/daily. SBP 150s today. Suspect patient's BP is elevated at home. Okay to continue on this regimen to minimize hypoperfusion.  Per primary team  T2DM - last A1c 6.1%. Trend CBG Anxiety - per primary GERD - may consider H2 blocker>PPI   Morene Crocker 09/30/2022, 2:47 PM

## 2022-09-30 NOTE — ED Notes (Signed)
Patient requesting medication for anxiety.  Doctors rounding on patient will order something for anxiety.

## 2022-10-01 ENCOUNTER — Other Ambulatory Visit (HOSPITAL_COMMUNITY): Payer: Self-pay

## 2022-10-01 DIAGNOSIS — N189 Chronic kidney disease, unspecified: Secondary | ICD-10-CM | POA: Diagnosis not present

## 2022-10-01 DIAGNOSIS — E8721 Acute metabolic acidosis: Secondary | ICD-10-CM | POA: Diagnosis not present

## 2022-10-01 LAB — RENAL FUNCTION PANEL
Albumin: 3.2 g/dL — ABNORMAL LOW (ref 3.5–5.0)
Anion gap: 10 (ref 5–15)
BUN: 33 mg/dL — ABNORMAL HIGH (ref 6–20)
CO2: 17 mmol/L — ABNORMAL LOW (ref 22–32)
Calcium: 7.4 mg/dL — ABNORMAL LOW (ref 8.9–10.3)
Chloride: 109 mmol/L (ref 98–111)
Creatinine, Ser: 5.16 mg/dL — ABNORMAL HIGH (ref 0.44–1.00)
GFR, Estimated: 10 mL/min — ABNORMAL LOW (ref >60)
Glucose, Bld: 129 mg/dL — ABNORMAL HIGH (ref 70–99)
Phosphorus: 5.3 mg/dL — ABNORMAL HIGH (ref 2.5–4.6)
Potassium: 4.4 mmol/L (ref 3.5–5.1)
Sodium: 136 mmol/L (ref 135–145)

## 2022-10-01 LAB — CBC
HCT: 25.8 % — ABNORMAL LOW (ref 36.0–46.0)
Hemoglobin: 7.9 g/dL — ABNORMAL LOW (ref 12.0–15.0)
MCH: 22.5 pg — ABNORMAL LOW (ref 26.0–34.0)
MCHC: 30.6 g/dL (ref 30.0–36.0)
MCV: 73.5 fL — ABNORMAL LOW (ref 80.0–100.0)
Platelets: 268 10*3/uL (ref 150–400)
RBC: 3.51 MIL/uL — ABNORMAL LOW (ref 3.87–5.11)
RDW: 23 % — ABNORMAL HIGH (ref 11.5–15.5)
WBC: 5 10*3/uL (ref 4.0–10.5)
nRBC: 0.4 % — ABNORMAL HIGH (ref 0.0–0.2)

## 2022-10-01 LAB — PARATHYROID HORMONE, INTACT (NO CA): PTH: 226 pg/mL — ABNORMAL HIGH (ref 15–65)

## 2022-10-01 LAB — GLUCOSE, CAPILLARY
Glucose-Capillary: 153 mg/dL — ABNORMAL HIGH (ref 70–99)
Glucose-Capillary: 137 mg/dL — ABNORMAL HIGH (ref 70–99)

## 2022-10-01 LAB — CALCIUM, IONIZED: Calcium, Ionized, Serum: 4 mg/dL — ABNORMAL LOW (ref 4.5–5.6)

## 2022-10-01 LAB — MAGNESIUM: Magnesium: 1.7 mg/dL (ref 1.7–2.4)

## 2022-10-01 MED ORDER — SODIUM CHLORIDE 0.9 % IV SOLN
250.0000 mg | Freq: Once | INTRAVENOUS | Status: AC
  Administered 2022-10-01: 250 mg via INTRAVENOUS
  Filled 2022-10-01: qty 20

## 2022-10-01 MED ORDER — METOPROLOL SUCCINATE ER 50 MG PO TB24
50.0000 mg | ORAL_TABLET | Freq: Every day | ORAL | 1 refills | Status: DC
  Filled 2022-10-01: qty 30, 30d supply, fill #0

## 2022-10-01 MED ORDER — FERROUS SULFATE 325 (65 FE) MG PO TABS: 325.0000 mg | ORAL_TABLET | Freq: Every day | ORAL | Status: DC

## 2022-10-01 MED ORDER — SODIUM BICARBONATE 650 MG PO TABS
1300.0000 mg | ORAL_TABLET | Freq: Two times a day (BID) | ORAL | 1 refills | Status: AC
Start: 2022-10-01 — End: 2023-10-01
  Filled 2022-10-01: qty 100, 25d supply, fill #0

## 2022-10-01 MED ORDER — HYDRALAZINE HCL 100 MG PO TABS
100.0000 mg | ORAL_TABLET | Freq: Three times a day (TID) | ORAL | 1 refills | Status: DC
  Filled 2022-10-01: qty 90, 30d supply, fill #0

## 2022-10-01 MED ORDER — VITAMIN D (ERGOCALCIFEROL) 1.25 MG (50000 UNIT) PO CAPS: 50000.0000 [IU] | ORAL_CAPSULE | ORAL | Status: DC

## 2022-10-01 MED ORDER — FERROUS SULFATE 325 (65 FE) MG PO TABS
325.0000 mg | ORAL_TABLET | Freq: Every day | ORAL | 1 refills | Status: DC
  Filled 2022-10-01: qty 100, 100d supply, fill #0

## 2022-10-01 MED ORDER — VITAMIN D (ERGOCALCIFEROL) 1.25 MG (50000 UNIT) PO CAPS
50000.0000 [IU] | ORAL_CAPSULE | ORAL | 0 refills | Status: DC
  Filled 2022-10-01: qty 5, 35d supply, fill #0

## 2022-10-01 MED ORDER — FOLIC ACID 1 MG PO TABS
1.0000 mg | ORAL_TABLET | Freq: Every day | ORAL | 1 refills | Status: DC
Start: 2022-10-02 — End: 2023-11-21
  Filled 2022-10-01: qty 30, 30d supply, fill #0

## 2022-10-01 MED ORDER — CALCITRIOL 0.25 MCG PO CAPS
0.2500 ug | ORAL_CAPSULE | Freq: Every day | ORAL | 1 refills | Status: DC
  Filled 2022-10-01: qty 30, 30d supply, fill #0

## 2022-10-01 MED ORDER — CALCIUM CARBONATE ANTACID 500 MG PO CHEW
1.0000 | CHEWABLE_TABLET | Freq: Three times a day (TID) | ORAL | 1 refills | Status: DC
  Filled 2022-10-01: qty 150, 50d supply, fill #0

## 2022-10-01 MED ORDER — PANTOPRAZOLE SODIUM 40 MG PO TBEC
40.0000 mg | DELAYED_RELEASE_TABLET | Freq: Every day | ORAL | 1 refills | Status: DC
  Filled 2022-10-01: qty 30, 30d supply, fill #0

## 2022-10-01 MED ORDER — HYDRALAZINE HCL 50 MG PO TABS: 100.0000 mg | ORAL_TABLET | Freq: Three times a day (TID) | ORAL | Status: DC

## 2022-10-01 NOTE — Progress Notes (Signed)
Patient discharged home, self care. PIV removed. AVS discussed, pt verbalized understanding. TOC received at bedside. Patient left the unit with all belongings, in NAD.

## 2022-10-01 NOTE — Progress Notes (Signed)
[facility] Progress Note    Subjective:   Patient overall feeling better than on admission. Frustrated with diet and fluid restriction. She is also having difficulty adjusting to changes as she transition to home. Denies paresthesias, cramping, respiratory or chest discomfort.  Of note, patient has been having increased urine output compared to home. No documented UOP or hat in the restroom.   Objective Vital signs in last 24 hours: Vitals:   09/30/22 1925 10/01/22 0500 10/01/22 0530 10/01/22 0751  BP: (!) 163/67  (!) 152/63 (!) 162/86  Pulse: 71  76 78  Resp: 17  18 15   Temp: 98.3 F (36.8 C)  98.4 F (36.9 C) 98.1 F (36.7 C)  TempSrc: Oral  Oral Oral  SpO2: 100%  100% 100%  Weight:  85.5 kg    Height:       Weight change: 0.7 kg - weight increased compared to admission; 85.5 Kg today  Intake/Output Summary (Last 24 hours) at 10/01/2022 1610 Last data filed at 09/30/2022 1114 Gross per 24 hour  Intake 180 ml  Output --  Net 180 ml    Assessment/ Plan: Pt is a 42 y.o. 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 PTH is still pending, however, high suspicion this is in the setting of PTH resistance secondary to CKD and and severely decreased vitamin D. No EKG changes at this time. S/p 2g of IV calcium.  -PTH pending -Corrected Ca today 8.0 -Continue on Calcium carbonate 500 mg TID between meals  -May transition to [facility] if persistently elevated phosphate -Continue on 50,000 units vitamin D weekly for 7 weeks -> recheck levels to assess further supplementation needs -Continue calcitriol 0.25 mcg daily  CKD V Metabolic acidosis Etiology likely in the setting of long standing uncontrolled hypertension.  -Admission C 5.06 and GFR 10. Bicarb 16; Cr 5.16 and GFR 10 -No urine output recorded  overnight. -Mildly increased phosphorus at 5.3; May need binder if persistently elevated.  --Can consider Foslo for hypocalcemia and hyperphosphatemia -No indication for lokelma at this time; unchanged weight from admission -Continue Na Bicarb 1300 mg BID  -Follow up at [facility] in 4 weeks with Dr. Glenna Fellows for HD planning -Strict I/O and daily weights -Renal diet -Avoid nephrotoxic medications and contrasted studies at this time  Acute on chronic microcytic anemia  Combined iron deficiency and chronic kidney disease. 07/2022 admission with Sat ratio of 7 s/p 1 unit pRBC and IV iron. Iron studies this admission with ferritin of 35, iron saturation of 15.  -S/p 1 unit IV Ferrlicit; Hgb today 7.9 from 7.2 -Continue on IV iron supplementation while inpatient -Will start Eranesp  60 mcg weekly -Goal Hgb 10  Uncontrolled HTN  On home hydralazine 50 mg TID, Toprol XL 50 mg/daily. SBP 160s today.   Per primary team T2DM - last A1c 6.1%. Trend CBG Anxiety - per primary GERD - may consider H2 blocker>PPI  [name]    Labs: Basic Metabolic Panel: Recent Labs  Lab 09/29/22 1946 09/30/22 0132 09/30/22 1112 09/30/22 1730 10/01/22 0425  NA 136 140  --   --  136  K 4.9 4.6  --   --  4.4  CL 108 106  --   --  109  CO2 16* 16*  --   --  17*  GLUCOSE 109* 100*  --   --  129*  BUN 34* 36*  --   --  33*  CREATININE 5.10* 4.92*  --   --  5.16*  CALCIUM 6.4* 6.8*  7.0* 7.4* 7.1* 7.4*  PHOS  --  5.1*  --   --  5.3*   Liver Function Tests: Recent Labs  Lab 09/29/22 1532 09/30/22 0132 10/01/22 0425  AST 20  --   --   ALT 13  --   --   ALKPHOS 79  --   --   BILITOT 0.2*  --   --   PROT 7.7  --   --   ALBUMIN 3.5 3.3* 3.2*   No results for input(s): "LIPASE", "AMYLASE" in the last 168 hours. No results for input(s): "AMMONIA" in the last 168 hours. CBC: Recent Labs  Lab 09/29/22 1946 09/30/22 0132 10/01/22 0425  WBC 5.7 6.2 5.0  NEUTROABS 3.0  --   --   HGB 7.4* 7.2*  7.9*  HCT 24.6* 23.8* 25.8*  MCV 76.4* 74.8* 73.5*  PLT 302 264 268   Cardiac Enzymes: No results for input(s): "CKTOTAL", "CKMB", "CKMBINDEX", "TROPONINI" in the last 168 hours. CBG: Recent Labs  Lab 09/30/22 2139 10/01/22 0753  GLUCAP 118* 153*    Iron Studies:  Recent Labs    09/30/22 1112  IRON 46  TIBC 298  FERRITIN 35   Studies/Results: CT Chest Wo Contrast  Result Date: 09/29/2022 CLINICAL DATA:  Hypokalemia, upper extremity paresthesia, paraneoplastic syndrome. EXAM: CT CHEST WITHOUT CONTRAST TECHNIQUE: Multidetector CT imaging of the chest was performed following the standard protocol without IV contrast. RADIATION DOSE REDUCTION: This exam was performed according to the departmental dose-optimization program which includes automated exposure control, adjustment of the mA and/or kV according to patient size and/or use of iterative reconstruction technique. COMPARISON:  None Available. FINDINGS: Cardiovascular: Mild coronary artery calcification. Global cardiac size is within normal limits. Small pericardial effusion. Hypoattenuation of the cardiac blood pool is in keeping with at least mild anemia. Central pulmonary arteries are of normal caliber. Thoracic aorta is unremarkable. Mediastinum/Nodes: No enlarged mediastinal or axillary lymph nodes. Thyroid gland, trachea, and esophagus demonstrate no significant findings. Lungs/Pleura: Minimal scarring within the superior segment of the left lower lobe. Lungs are otherwise clear. No pneumothorax or pleural effusion. Upper Abdomen: No acute abnormality. Musculoskeletal: No chest wall mass or suspicious bone lesions identified. IMPRESSION: 1. No occult pulmonary or mediastinal mass identified. 2. Mild coronary artery calcification. 3. Small pericardial effusion. 4. Hypoattenuation of the cardiac blood pool in keeping with at least mild anemia. Electronically Signed   By: Helyn Numbers M.D.   On: 09/29/2022 22:14    Medications: Infusions:  ferric gluconate (FERRLECIT) IVPB      Scheduled Medications:  calcitRIOL  0.25 mcg Oral Daily   calcium carbonate  1 tablet Oral TID BM   darbepoetin (ARANESP) injection - NON-DIALYSIS  60 mcg Subcutaneous Once   folic acid  1 mg Oral Daily   heparin  5,000 Units Subcutaneous Q8H   hydrALAZINE  50 mg Oral TID   metoprolol succinate  50 mg Oral Daily   pantoprazole  40 mg Oral Daily   sodium bicarbonate  650 mg Oral BID   Vitamin D (Ergocalciferol)  50,000 Units Oral Once per day on Monday Thursday    have reviewed scheduled and prn medications.  Physical Exam: General: no apparent distress Heart: RRR without murmurs on auscultation. 2+ radial and DP pulses Lungs: CTAB, no crackles or rales Abdomen: soft, non distended, non  tender.  Extremities:Warm, dry. No extremity edema     10/01/2022,8:42 AM  LOS: 1 day

## 2022-10-01 NOTE — Plan of Care (Signed)

## 2022-10-01 NOTE — Plan of Care (Signed)

## 2022-10-01 NOTE — Discharge Summary (Signed)
Name: [name] MRN: [id number] DOB: 08-11-1980 42 y.o. PCP: Philomena Doheny, MD  Date of Admission: 09/29/2022  6:53 PM Date of Discharge: 10/01/2022  Attending Physician: Dr. Antony Contras  DISCHARGE DIAGNOSIS:  Primary Problem: Hypocalcemia   Hospital Problems: Principal Problem:   Hypocalcemia Active Problems:   Type 2 diabetes mellitus (HCC)   Hypertension   CKD (chronic kidney disease), stage V (HCC)   Vitamin D deficiency    DISCHARGE MEDICATIONS:   Allergies as of 10/01/2022       Reactions   Shellfish Allergy Anaphylaxis, Swelling   Morphine And Codeine Nausea And Vomiting        Medication List     TAKE these medications    acetaminophen 500 MG tablet Commonly known as: TYLENOL Take 1,000 mg by mouth every 6 (six) hours as needed for moderate pain.   calcitRIOL 0.25 MCG capsule Commonly known as: ROCALTROL Take 1 capsule (0.25 mcg total) by mouth daily. Start taking on: October 02, 2022   Calcium Antacid 500 MG chewable tablet Generic drug: calcium carbonate Chew 1 tablet (200 mg of elemental calcium total) by mouth 3 (three) times daily between meals.   FeroSul 325 (65 Fe) MG tablet Generic drug: ferrous sulfate Take 1 tablet (325 mg total) by mouth daily. Start taking on: October 02, 2022   folic acid 1 MG tablet Commonly known as: FOLVITE Take 1 tablet (1 mg total) by mouth daily. Start taking on: October 02, 2022   hydrALAZINE 100 MG tablet Commonly known as: APRESOLINE Take 1 tablet (100 mg total) by mouth 3 (three) times daily.   metoprolol succinate 50 MG 24 hr tablet Commonly known as: TOPROL-XL Take 1 tablet (50 mg total) by mouth daily. Take with or immediately following a meal.   pantoprazole 40 MG tablet Commonly known as: PROTONIX Take 1 tablet (40 mg total) by mouth daily.   sodium bicarbonate 650 MG tablet Take 2 tablets (1,300 mg total) by mouth 2 (two) times daily. What changed: how much to take    Vitamin D (Ergocalciferol) 1.25 MG (50000 UNIT) Caps capsule Commonly known as: DRISDOL Take 1 capsule (50,000 Units total) by mouth every Monday. Start taking on: October 04, 2022        DISPOSITION AND FOLLOW-UP:  [name] was discharged from [facility] in Good condition. At the hospital follow up visit please address:  Follow-up Recommendations:  #CKD IV/V #Hypocalcemia #Vitamin D deficiency  #AGMA -Ensure patient has followed-up (or will be following up with [facility]) -Repeat RFP -Ensure compliance with medications   Anemia multifactorial 2/2 to CKD and Iron Deficiency  -Repeat CBC  #HTN -Ensure compliance with new medication regimen  HOSPITAL COURSE:  Patient Summary:  [name] is a 42 y.o.female with PMH of CKD IV/V, anemia of chronic disease, HTN, T2DM, GERD, asthma, and marijuana use who presented to [facility] for lower extremity swelling, noted to have low calcium (6.2) and was sent to ED for further evaluation.  Subsequent work-up and consult to nephrology revealed hypocalcemia as a consequence of an advancement of CKD. Patient was also Vitamin D deficient, anemic, and with elevated PTH. Nephrology started patient on regimen of calcium supplementation, ESA, Calcitriol, Sodium Bicarb, in addition to IV iron. Calcium was 7.6 (corrected) on discharge. Patient to follow-up with [facility] in 4 weeks for further management.  Patient also had LE swelling which began after Amlodipine initiation. Amlodipine was discontinued. Patient remained hypertensive to home Hydralazine was increased to 1000 TID  on discharge. Patient to continue on home Toprol-XL.   DISCHARGE INSTRUCTIONS:   Discharge Instructions     Diet - low sodium heart healthy   Complete by: As directed    Discharge instructions   Complete by: As directed    It was a pleasure being a part of your care team.  You were treated for low calcium which caused the muscle tingling you experienced. We  consulted the kidney doctors who attributed this to an advancement of your kidney disease. Please take all medications as prescribed and follow-up with the kidney doctors at [facility] for further management.  You will also be hearing from the Internal Medicine Center about scheduling a follow-up appointment.   Increase activity slowly   Complete by: As directed        SUBJECTIVE:  Patient feels well and is ready for discharge Discharge Vitals:   BP (!) 162/86 (BP Location: Right Arm)   Pulse 78   Temp 98.1 F (36.7 C) (Oral)   Resp 15   Ht 5\' 7"  (1.702 m)   Wt 85.5 kg   SpO2 100%   BMI 29.52 kg/m   OBJECTIVE:  Physical Exam: Constitutional: lying in bed, in no acute distress Cardiovascular: regular rate and rhythm, no m/r/g, no LEE Pulmonary/Chest: normal work of breathing on room air, lungs clear to auscultation bilaterally Abdominal: soft, non-tender, non-distended Psych: normal mood and affect  Pertinent Labs, Studies, and Procedures:     Latest Ref Rng & Units 10/01/2022    4:25 AM 09/30/2022    1:32 AM 09/29/2022    7:46 PM  CBC  WBC 4.0 - 10.5 K/uL 5.0  6.2  5.7   Hemoglobin 12.0 - 15.0 g/dL 7.9  7.2  7.4   Hematocrit 36.0 - 46.0 % 25.8  23.8  24.6   Platelets 150 - 400 K/uL 268  264  302        Latest Ref Rng & Units 10/01/2022    4:25 AM 09/30/2022    5:30 PM 09/30/2022   11:12 AM  CMP  Glucose 70 - 99 mg/dL 098     BUN 6 - 20 mg/dL 33     Creatinine 1.19 - 1.00 mg/dL 1.47     Sodium 829 - 562 mmol/L 136     Potassium 3.5 - 5.1 mmol/L 4.4     Chloride 98 - 111 mmol/L 109     CO2 22 - 32 mmol/L 17     Calcium 8.9 - 10.3 mg/dL 7.4  7.1  7.4     CT Chest Wo Contrast  Result Date: 09/29/2022 CLINICAL DATA:  Hypokalemia, upper extremity paresthesia, paraneoplastic syndrome. EXAM: CT CHEST WITHOUT CONTRAST TECHNIQUE: Multidetector CT imaging of the chest was performed following the standard protocol without IV contrast. RADIATION DOSE  REDUCTION: This exam was performed according to the departmental dose-optimization program which includes automated exposure control, adjustment of the mA and/or kV according to patient size and/or use of iterative reconstruction technique. COMPARISON:  None Available. FINDINGS: Cardiovascular: Mild coronary artery calcification. Global cardiac size is within normal limits. Small pericardial effusion. Hypoattenuation of the cardiac blood pool is in keeping with at least mild anemia. Central pulmonary arteries are of normal caliber. Thoracic aorta is unremarkable. Mediastinum/Nodes: No enlarged mediastinal or axillary lymph nodes. Thyroid gland, trachea, and esophagus demonstrate no significant findings. Lungs/Pleura: Minimal scarring within the superior segment of the left lower lobe. Lungs are otherwise clear. No pneumothorax or pleural effusion. Upper Abdomen: No  acute abnormality. Musculoskeletal: No chest wall mass or suspicious bone lesions identified. IMPRESSION: 1. No occult pulmonary or mediastinal mass identified. 2. Mild coronary artery calcification. 3. Small pericardial effusion. 4. Hypoattenuation of the cardiac blood pool in keeping with at least mild anemia. Electronically Signed   By: Helyn Numbers M.D.   On: 09/29/2022 22:14     Signed: Carmina Miller, DO  Internal Medicine Resident, PGY-1 [facility]  Pager: [phone] 5:04 PM, 10/01/2022

## 2022-10-04 ENCOUNTER — Telehealth: Payer: Self-pay

## 2022-10-04 NOTE — Progress Notes (Signed)
Internal Medicine Clinic Attending  Case discussed with the resident at the time of the visit.  We reviewed the resident's history and exam and pertinent patient test results.  I agree with the assessment, diagnosis, and plan of care documented in the resident's note.  

## 2022-10-04 NOTE — Transitions of Care (Post Inpatient/ED Visit) (Signed)
10/04/2022  Name: [name] MRN: [id number] DOB: 03-16-1980  Today's TOC FU Call Status: Today's TOC FU Call Status:: Successful TOC FU Call Completed TOC FU Call Complete Date: 10/04/22 Patient's Name and Date of Birth confirmed.  Transition Care Management Follow-up Telephone Call Date of Discharge: 10/01/22 Discharge Facility: [facility]) Type of Discharge: Inpatient Admission Primary Inpatient Discharge Diagnosis:: hypocalcemia How have you been since you were released from the hospital?: Better Any questions or concerns?: No  Items Reviewed: Did you receive and understand the discharge instructions provided?: Yes Medications obtained,verified, and reconciled?: Yes (Medications Reviewed) Any new allergies since your discharge?: No Dietary orders reviewed?: Yes Do you have support at home?: Yes People in Home: significant other  Medications Reviewed Today: Medications Reviewed Today     Reviewed by Karena Addison, LPN (Licensed Practical Nurse) on 10/04/22 at [phone]  Med List Status: <None>   Medication Order Taking? Sig Documenting Provider Last Dose Status Informant  acetaminophen (TYLENOL) 500 MG tablet [id number] No Take 1,000 mg by mouth every 6 (six) hours as needed for moderate pain. [provider] unk Active Self, Pharmacy Records  calcitRIOL (ROCALTROL) 0.25 MCG capsule [id number]  Take 1 capsule (0.25 mcg total) by mouth daily. Carmina Miller, DO  Active   calcium carbonate (TUMS - DOSED IN MG ELEMENTAL CALCIUM) 500 MG chewable tablet [id number]  Chew 1 tablet (200 mg of elemental calcium total) by mouth 3 (three) times daily between meals. Carmina Miller, DO  Active   ferrous sulfate 325 (65 FE) MG tablet [id number]  Take 1 tablet (325 mg total) by mouth daily. Carmina Miller, DO  Active   folic acid (FOLVITE) 1 MG tablet [id number]  Take 1 tablet (1 mg total) by mouth daily. Carmina Miller, DO  Active   hydrALAZINE (APRESOLINE) 100 MG tablet [id number]  Take  1 tablet (100 mg total) by mouth 3 (three) times daily. Carmina Miller, DO  Active   metoprolol succinate (TOPROL-XL) 50 MG 24 hr tablet [id number]  Take 1 tablet (50 mg total) by mouth daily. Take with or immediately following a meal. Carmina Miller, DO  Active   pantoprazole (PROTONIX) 40 MG tablet [id number]  Take 1 tablet (40 mg total) by mouth daily. Carmina Miller, DO  Active   sodium bicarbonate 650 MG tablet [id number]  Take 2 tablets (1,300 mg total) by mouth 2 (two) times daily. Carmina Miller, DO  Active   Vitamin D, Ergocalciferol, (DRISDOL) 1.25 MG (50000 UNIT) CAPS capsule [id number]  Take 1 capsule (50,000 Units total) by mouth every Monday. Carmina Miller, DO  Active             Home Care and Equipment/Supplies: Were Home Health Services Ordered?: NA Any new equipment or medical supplies ordered?: NA  Functional Questionnaire: Do you need assistance with bathing/showering or dressing?: No Do you need assistance with meal preparation?: No Do you need assistance with eating?: No Do you have difficulty maintaining continence: No Do you need assistance with getting out of bed/getting out of a chair/moving?: No Do you have difficulty managing or taking your medications?: No  Follow up appointments reviewed: PCP Follow-up appointment confirmed?: No (no avail appts, sent message to staff to schedule) Specialist Hospital Follow-up appointment confirmed?: NA Do you need transportation to your follow-up appointment?: No Do you understand care options if your condition(s) worsen?: Yes-patient verbalized understanding    SIGNATURE Karena Addison, LPN [facility] Nurse Health Advisor Direct Dial [phone]

## 2022-10-25 ENCOUNTER — Encounter: Payer: Medicaid Other | Admitting: Internal Medicine

## 2022-11-08 ENCOUNTER — Ambulatory Visit: Payer: Medicaid Other | Admitting: *Deleted

## 2022-11-08 DIAGNOSIS — Z111 Encounter for screening for respiratory tuberculosis: Secondary | ICD-10-CM

## 2022-11-08 DIAGNOSIS — Z23 Encounter for immunization: Secondary | ICD-10-CM

## 2022-11-08 NOTE — Progress Notes (Signed)
Pt informed to return Wed around the same time; no later than Thursday.

## 2022-11-10 ENCOUNTER — Encounter: Payer: Self-pay | Admitting: *Deleted

## 2022-11-10 LAB — TB SKIN TEST
Induration: 0 mm
TB Skin Test: NEGATIVE

## 2022-11-29 ENCOUNTER — Ambulatory Visit: Payer: Medicaid Other | Admitting: Student

## 2022-11-29 VITALS — BP 170/102 | HR 70 | Temp 98.9°F | Ht 67.0 in | Wt 181.1 lb

## 2022-11-29 DIAGNOSIS — D631 Anemia in chronic kidney disease: Secondary | ICD-10-CM | POA: Diagnosis not present

## 2022-11-29 DIAGNOSIS — I120 Hypertensive chronic kidney disease with stage 5 chronic kidney disease or end stage renal disease: Secondary | ICD-10-CM

## 2022-11-29 DIAGNOSIS — I10 Essential (primary) hypertension: Secondary | ICD-10-CM

## 2022-11-29 DIAGNOSIS — N185 Chronic kidney disease, stage 5: Secondary | ICD-10-CM | POA: Diagnosis not present

## 2022-11-29 LAB — CBC
HCT: 28.4 % — ABNORMAL LOW (ref 36.0–46.0)
Hemoglobin: 8.8 g/dL — ABNORMAL LOW (ref 12.0–15.0)
MCH: 24.7 pg — ABNORMAL LOW (ref 26.0–34.0)
MCHC: 31 g/dL (ref 30.0–36.0)
MCV: 79.8 fL — ABNORMAL LOW (ref 80.0–100.0)
Platelets: 226 10*3/uL (ref 150–400)
RBC: 3.56 MIL/uL — ABNORMAL LOW (ref 3.87–5.11)
RDW: 17.4 % — ABNORMAL HIGH (ref 11.5–15.5)
WBC: 4.1 10*3/uL (ref 4.0–10.5)
nRBC: 0 % (ref 0.0–0.2)

## 2022-11-29 LAB — BASIC METABOLIC PANEL
Anion gap: 12 (ref 5–15)
BUN: 31 mg/dL — ABNORMAL HIGH (ref 6–20)
CO2: 18 mmol/L — ABNORMAL LOW (ref 22–32)
Calcium: 6.8 mg/dL — ABNORMAL LOW (ref 8.9–10.3)
Chloride: 108 mmol/L (ref 98–111)
Creatinine, Ser: 6.09 mg/dL — ABNORMAL HIGH (ref 0.44–1.00)
GFR, Estimated: 8 mL/min — ABNORMAL LOW (ref >60)
Glucose, Bld: 97 mg/dL (ref 70–99)
Potassium: 4.9 mmol/L (ref 3.5–5.1)
Sodium: 138 mmol/L (ref 135–145)

## 2022-11-29 MED ORDER — HYDRALAZINE HCL 100 MG PO TABS: 50.0000 mg | ORAL_TABLET | Freq: Three times a day (TID) | ORAL | 1 refills | Status: DC

## 2022-11-29 MED ORDER — METOPROLOL SUCCINATE ER 50 MG PO TB24: 75.0000 mg | ORAL_TABLET | Freq: Every day | ORAL | 1 refills | Status: DC

## 2022-11-29 NOTE — Patient Instructions (Signed)
Thank you, Ms.Whitman Hero for allowing [place] to provide your care today. Today we discussed your chronic kidney disease and your blood pressure.    I have ordered the following labs for you:   Lab Orders         BMP8+Anion Gap         CBC no Diff      Tests ordered today:  - none  Referrals ordered today:   Referral Orders  No referral(s) requested today     I have ordered the following medication/changed the following medications:   Stop the following medications: Medications Discontinued During This Encounter  Medication Reason   hydrALAZINE (APRESOLINE) 100 MG tablet Reorder   metoprolol succinate (TOPROL-XL) 50 MG 24 hr tablet Reorder     Start the following medications: Meds ordered this encounter  Medications   hydrALAZINE (APRESOLINE) 100 MG tablet    Sig: Take 0.5 tablets (50 mg total) by mouth 3 (three) times daily.    Dispense:  90 tablet    Refill:  1   metoprolol succinate (TOPROL-XL) 50 MG 24 hr tablet    Sig: Take 1.5 tablets (75 mg total) by mouth daily. Take with or immediately following a meal.    Dispense:  30 tablet    Refill:  1     Follow up: 1 month  Remember:   - We have increased the dose of your metoprolol from 50 mg daily to 75 mg daily.   - We have decreased the dose of your hydralazine from 100 mg three times a day to 50 mg three times a day.   - If possible, please try to get your blood pressure measured at least once a week and bring these in at your follow up visit.   - The number for [facility] is [phone], please call them to reschedule your appointment. It is very important that we get their input given your chronic kidney disease and recent hospitalization for low calcium.   - I will call you with the results of your labs from today and make any changes as needed.   Should you have any questions or concerns please call the internal medicine clinic at [phone].     Vali Capano Colbert Coyer, MD PGY-1  Internal Medicine Teaching [facility]

## 2022-11-29 NOTE — Progress Notes (Signed)
Established Patient Office Visit  Subjective   Patient ID: [name], female    DOB: 1980/07/30  Age: 42 y.o. MRN: [id number]  Chief Complaint  Patient presents with   Hypertension    Patient is a 42 yo with a past medical history stated below who presents today for follow-up for HTN, CKD, and anemia follow up. She was hospitalized 9/11-9/13 for hypocalcemia. Please see problem based assessment and plan for additional details.     Past Medical History:  Diagnosis Date   Anxiety    Asthma    Depression    Diabetes mellitus    Hyperemesis arising during pregnancy    Hypertension    Mental disorder    Review of Systems  Cardiovascular:  Negative for chest pain and palpitations.  Gastrointestinal:  Negative for abdominal pain, nausea and vomiting.  Genitourinary:  Negative for dysuria.  Neurological:  Negative for tingling and headaches.       Lightheadedness      Objective:     BP (!) 170/102 (BP Location: Right Arm, Patient Position: Sitting, Cuff Size: Small)   Pulse 70   Temp 98.9 F (37.2 C) (Oral)   Ht 5\' 7"  (1.702 m)   Wt 181 lb 1.6 oz (82.1 kg)   SpO2 100%   BMI 28.36 kg/m  BP Readings from Last 3 Encounters:  11/29/22 (!) 170/102  10/01/22 (!) 162/86  09/29/22 138/66   Wt Readings from Last 3 Encounters:  11/29/22 181 lb 1.6 oz (82.1 kg)  10/01/22 188 lb 7.9 oz (85.5 kg)  09/29/22 187 lb (84.8 kg)    Physical Exam HENT:     Head: Normocephalic and atraumatic.     Nose: Nose normal.     Mouth/Throat:     Mouth: Mucous membranes are moist.  Cardiovascular:     Rate and Rhythm: Normal rate and regular rhythm.     Pulses: Normal pulses.     Heart sounds: Normal heart sounds.  Pulmonary:     Effort: Pulmonary effort is normal.     Breath sounds: Normal breath sounds.  Abdominal:     General: Bowel sounds are normal.     Palpations: Abdomen is soft.  Skin:    General: Skin is warm and dry.  Neurological:     General: No focal deficit  present.     Mental Status: She is alert.  Psychiatric:        Mood and Affect: Mood normal.        Behavior: Behavior normal.     The ASCVD Risk score (Arnett DK, et al., 2019) failed to calculate for the following reasons:   Cannot find a previous HDL lab   Cannot find a previous total cholesterol lab    Assessment & Plan:   Problem List Items Addressed This Visit     Hypertension - Primary    Patient's BP today 160/102 and 170/102. Taking hydralazine 100 mg TID (increased during hospitalization in September) and metoprolol succinate 50 mg daily, has not taken today due to busy schedule. Patient denies CP, headache, vision changes, or N/V. Patient states she has been having episodes of lightheadedness at work, feels like this may worsen with the hydralazine. CV exam unremarkable, no lower extremity swelling noted on exam. Discussed decreasing dose of hydralazine while increasing dose of metoprolol to help address symptoms and help manage her BP. Patient agreeable.  - Discontinue hydralazine 100 mg TID, START hydralazine 50 mg TID - Discontinue metoprolol 50 mg  daily, START metoprolol 75 mg daily  - Follow up BP and whether patient needs further adjustments to her BP regimen in 1 month      Relevant Medications   hydrALAZINE (APRESOLINE) 100 MG tablet   metoprolol succinate (TOPROL-XL) 50 MG 24 hr tablet   Other Relevant Orders   BMP w Anion Gap (STAT/Sunquest-performed on-site) (Completed)   CKD (chronic kidney disease), stage V (HCC)    Patient's creatinine at discharge on 9/13 5.16 and GFR 10, today Cr 6.09 and GFR 8. Hgb on discharge 7.9, today Hgb 8.8. Denied dysuria, issues with voiding, weakness, or overt bleeding.   Patient was given outpatient follow up with [facility] but patient has not responded to calls to set up an appointment. Discussed importance of following up with nephrology given her worsening kidney function and chronic hypocalcemia.  - Patient  to call [facility] to set up appointment  - Lab visit in 2 weeks including BMP and follow up as needed      Relevant Orders   BMP8+Anion Gap   Hypocalcemia    Patient's Ca on discharge 7.4/corrected 8.0, today Ca 6.8/corrected ~7.4. Denied tingling, muscle spasms, palpitations, states she feels fine. Discussed importance of continuing with medications to help increase calcium levels as well as a 2 week lab visit to recheck Cr, Ca, Vitamin D levels.  - Continue calcitriol 0.25 mcg daily  - Continue calcium antacid 500 mg tablet by mouth 3 times daily between meals - Continue Vitamin D 1.25 mcg every 7 days  - Lab visit in 2 weeks including BMP, Vitamin D, follow up as needed      Relevant Orders   BMP8+Anion Gap   Vitamin D 1,25 dihydroxy   Other Visit Diagnoses     Anemia due to chronic kidney disease, unspecified CKD stage       Relevant Orders   CBC no Diff (Completed)       Return in about 4 weeks (around 12/27/2022) for CKD and BP follow up .  Patient seen with Dr. Criselda Peaches.   Aasiya Creasey Colbert Coyer, MD

## 2022-11-30 MED ORDER — VITAMIN D (ERGOCALCIFEROL) 1.25 MG (50000 UNIT) PO CAPS: 50000.0000 [IU] | ORAL_CAPSULE | ORAL | 0 refills | Status: DC

## 2022-11-30 NOTE — Assessment & Plan Note (Addendum)
Patient's Ca on discharge 7.4/corrected 8.0, today Ca 6.8/corrected ~7.4. Denied tingling, muscle spasms, palpitations, states she feels fine. Discussed importance of continuing with medications to help increase calcium levels as well as a 2 week lab visit to recheck Cr, Ca, Vitamin D levels.  - Continue calcitriol 0.25 mcg daily  - Continue calcium antacid 500 mg tablet by mouth 3 times daily between meals - Continue Vitamin D 1.25 mcg every 7 days  - Lab visit in 2 weeks including BMP, Vitamin D, follow up as needed

## 2022-11-30 NOTE — Assessment & Plan Note (Addendum)
Patient's creatinine at discharge on 9/13 5.16 and GFR 10, today Cr 6.09 and GFR 8. Hgb on discharge 7.9, today Hgb 8.8. Denied dysuria, issues with voiding, weakness, or overt bleeding.   Patient was given outpatient follow up with [facility] but patient has not responded to calls to set up an appointment. Discussed importance of following up with nephrology given her worsening kidney function and chronic hypocalcemia.  - Patient to call [facility] to set up appointment  - Lab visit in 2 weeks including BMP and follow up as needed

## 2022-11-30 NOTE — Assessment & Plan Note (Signed)
Patient's BP today 160/102 and 170/102. Taking hydralazine 100 mg TID (increased during hospitalization in September) and metoprolol succinate 50 mg daily, has not taken today due to busy schedule. Patient denies CP, headache, vision changes, or N/V. Patient states she has been having episodes of lightheadedness at work, feels like this may worsen with the hydralazine. CV exam unremarkable, no lower extremity swelling noted on exam. Discussed decreasing dose of hydralazine while increasing dose of metoprolol to help address symptoms and help manage her BP. Patient agreeable.  - Discontinue hydralazine 100 mg TID, START hydralazine 50 mg TID - Discontinue metoprolol 50 mg daily, START metoprolol 75 mg daily  - Follow up BP and whether patient needs further adjustments to her BP regimen in 1 month

## 2022-12-01 NOTE — Progress Notes (Signed)
 Internal Medicine Clinic Attending  I was physically present during the key portions of the resident provided service and participated in the medical decision making of patient's management care. I reviewed pertinent patient test results.  The assessment, diagnosis, and plan were formulated together and I agree with the documentation in the resident's note.  Inez Catalina, MD

## 2022-12-14 ENCOUNTER — Other Ambulatory Visit: Payer: Medicaid Other

## 2022-12-27 ENCOUNTER — Inpatient Hospital Stay (HOSPITAL_COMMUNITY): Payer: Medicaid Other

## 2022-12-27 ENCOUNTER — Other Ambulatory Visit: Payer: Self-pay

## 2022-12-27 ENCOUNTER — Inpatient Hospital Stay (HOSPITAL_COMMUNITY)
Admission: EM | Admit: 2022-12-27 | Discharge: 2022-12-29 | DRG: 683 | Disposition: A | Discharge status: Home / Self Care | Payer: Medicaid Other | Attending: Family Medicine | Admitting: Family Medicine

## 2022-12-27 DIAGNOSIS — N185 Chronic kidney disease, stage 5: Secondary | ICD-10-CM | POA: Diagnosis present

## 2022-12-27 DIAGNOSIS — T465X6A Underdosing of other antihypertensive drugs, initial encounter: Secondary | ICD-10-CM | POA: Diagnosis present

## 2022-12-27 DIAGNOSIS — E872 Acidosis, unspecified: Secondary | ICD-10-CM | POA: Diagnosis present

## 2022-12-27 DIAGNOSIS — Z5971 Insufficient health insurance coverage: Secondary | ICD-10-CM | POA: Diagnosis not present

## 2022-12-27 DIAGNOSIS — D509 Iron deficiency anemia, unspecified: Secondary | ICD-10-CM | POA: Diagnosis present

## 2022-12-27 DIAGNOSIS — Z8249 Family history of ischemic heart disease and other diseases of the circulatory system: Secondary | ICD-10-CM

## 2022-12-27 DIAGNOSIS — D631 Anemia in chronic kidney disease: Secondary | ICD-10-CM | POA: Diagnosis present

## 2022-12-27 DIAGNOSIS — E875 Hyperkalemia: Secondary | ICD-10-CM | POA: Diagnosis present

## 2022-12-27 DIAGNOSIS — Z91013 Allergy to seafood: Secondary | ICD-10-CM

## 2022-12-27 DIAGNOSIS — E538 Deficiency of other specified B group vitamins: Secondary | ICD-10-CM | POA: Diagnosis present

## 2022-12-27 DIAGNOSIS — I160 Hypertensive urgency: Secondary | ICD-10-CM | POA: Diagnosis present

## 2022-12-27 DIAGNOSIS — K219 Gastro-esophageal reflux disease without esophagitis: Secondary | ICD-10-CM | POA: Diagnosis present

## 2022-12-27 DIAGNOSIS — D649 Anemia, unspecified: Secondary | ICD-10-CM | POA: Diagnosis not present

## 2022-12-27 DIAGNOSIS — N186 End stage renal disease: Secondary | ICD-10-CM | POA: Diagnosis not present

## 2022-12-27 DIAGNOSIS — E876 Hypokalemia: Secondary | ICD-10-CM | POA: Diagnosis not present

## 2022-12-27 DIAGNOSIS — E1122 Type 2 diabetes mellitus with diabetic chronic kidney disease: Secondary | ICD-10-CM | POA: Diagnosis present

## 2022-12-27 DIAGNOSIS — Z79899 Other long term (current) drug therapy: Secondary | ICD-10-CM

## 2022-12-27 DIAGNOSIS — I120 Hypertensive chronic kidney disease with stage 5 chronic kidney disease or end stage renal disease: Secondary | ICD-10-CM | POA: Diagnosis present

## 2022-12-27 DIAGNOSIS — Z992 Dependence on renal dialysis: Secondary | ICD-10-CM | POA: Diagnosis not present

## 2022-12-27 DIAGNOSIS — Z885 Allergy status to narcotic agent status: Secondary | ICD-10-CM | POA: Diagnosis not present

## 2022-12-27 DIAGNOSIS — J45909 Unspecified asthma, uncomplicated: Secondary | ICD-10-CM | POA: Diagnosis present

## 2022-12-27 DIAGNOSIS — N179 Acute kidney failure, unspecified: Principal | ICD-10-CM | POA: Diagnosis present

## 2022-12-27 DIAGNOSIS — T447X6A Underdosing of beta-adrenoreceptor antagonists, initial encounter: Secondary | ICD-10-CM | POA: Diagnosis present

## 2022-12-27 DIAGNOSIS — R112 Nausea with vomiting, unspecified: Secondary | ICD-10-CM | POA: Diagnosis not present

## 2022-12-27 DIAGNOSIS — F32A Depression, unspecified: Secondary | ICD-10-CM | POA: Diagnosis present

## 2022-12-27 DIAGNOSIS — Z833 Family history of diabetes mellitus: Secondary | ICD-10-CM | POA: Diagnosis not present

## 2022-12-27 DIAGNOSIS — Z91128 Patient's intentional underdosing of medication regimen for other reason: Secondary | ICD-10-CM

## 2022-12-27 LAB — CBC WITH DIFFERENTIAL/PLATELET
Abs Immature Granulocytes: 0.01 10*3/uL (ref 0.00–0.07)
Basophils Absolute: 0 10*3/uL (ref 0.0–0.1)
Basophils Relative: 1 %
Eosinophils Absolute: 0.1 10*3/uL (ref 0.0–0.5)
Eosinophils Relative: 1 %
HCT: 27.1 % — ABNORMAL LOW (ref 36.0–46.0)
Hemoglobin: 8.5 g/dL — ABNORMAL LOW (ref 12.0–15.0)
Immature Granulocytes: 0 %
Lymphocytes Relative: 36 %
Lymphs Abs: 1.8 10*3/uL (ref 0.7–4.0)
MCH: 25.3 pg — ABNORMAL LOW (ref 26.0–34.0)
MCHC: 31.4 g/dL (ref 30.0–36.0)
MCV: 80.7 fL (ref 80.0–100.0)
Monocytes Absolute: 0.4 10*3/uL (ref 0.1–1.0)
Monocytes Relative: 8 %
Neutro Abs: 2.8 10*3/uL (ref 1.7–7.7)
Neutrophils Relative %: 54 %
Platelets: 212 10*3/uL (ref 150–400)
RBC: 3.36 MIL/uL — ABNORMAL LOW (ref 3.87–5.11)
RDW: 16.4 % — ABNORMAL HIGH (ref 11.5–15.5)
WBC: 5.1 10*3/uL (ref 4.0–10.5)
nRBC: 0 % (ref 0.0–0.2)

## 2022-12-27 LAB — COMPREHENSIVE METABOLIC PANEL
ALT: 15 U/L (ref 0–44)
AST: 21 U/L (ref 15–41)
Albumin: 3.6 g/dL (ref 3.5–5.0)
Alkaline Phosphatase: 73 U/L (ref 38–126)
Anion gap: 8 (ref 5–15)
BUN: 50 mg/dL — ABNORMAL HIGH (ref 6–20)
CO2: 17 mmol/L — ABNORMAL LOW (ref 22–32)
Calcium: 7.2 mg/dL — ABNORMAL LOW (ref 8.9–10.3)
Chloride: 112 mmol/L — ABNORMAL HIGH (ref 98–111)
Creatinine, Ser: 6.34 mg/dL — ABNORMAL HIGH (ref 0.44–1.00)
GFR, Estimated: 8 mL/min — ABNORMAL LOW (ref >60)
Glucose, Bld: 121 mg/dL — ABNORMAL HIGH (ref 70–99)
Potassium: 5.6 mmol/L — ABNORMAL HIGH (ref 3.5–5.1)
Sodium: 137 mmol/L (ref 135–145)
Total Bilirubin: 0.4 mg/dL (ref <1.2)
Total Protein: 7.3 g/dL (ref 6.5–8.1)

## 2022-12-27 LAB — URINALYSIS, ROUTINE W REFLEX MICROSCOPIC
Bilirubin Urine: NEGATIVE
Glucose, UA: 50 mg/dL — AB
Hgb urine dipstick: NEGATIVE
Ketones, ur: NEGATIVE mg/dL
Leukocytes,Ua: NEGATIVE
Nitrite: NEGATIVE
Protein, ur: >=300 mg/dL — AB
Specific Gravity, Urine: 1.013 (ref 1.005–1.030)
pH: 6 (ref 5.0–8.0)

## 2022-12-27 LAB — CREATININE, URINE, RANDOM: Creatinine, Urine: 169 mg/dL

## 2022-12-27 LAB — CBG MONITORING, ED: Glucose-Capillary: 108 mg/dL — ABNORMAL HIGH (ref 70–99)

## 2022-12-27 LAB — TROPONIN I (HIGH SENSITIVITY): Troponin I (High Sensitivity): 16 ng/L (ref <18)

## 2022-12-27 LAB — SODIUM, URINE, RANDOM: Sodium, Ur: 92 mmol/L

## 2022-12-27 LAB — MRSA NEXT GEN BY PCR, NASAL: MRSA by PCR Next Gen: NOT DETECTED

## 2022-12-27 LAB — HCG, QUANTITATIVE, PREGNANCY: hCG, Beta Chain, Quant, S: 1 m[IU]/mL (ref <5)

## 2022-12-27 MED ORDER — METOPROLOL SUCCINATE ER 25 MG PO TB24
75.0000 mg | ORAL_TABLET | Freq: Every day | ORAL | Status: DC
  Administered 2022-12-27 – 2022-12-29 (×3): 75 mg via ORAL
  Filled 2022-12-27 (×2): qty 3
  Filled 2022-12-27: qty 1

## 2022-12-27 MED ORDER — HYDRALAZINE HCL 25 MG PO TABS
50.0000 mg | ORAL_TABLET | Freq: Three times a day (TID) | ORAL | Status: DC
  Administered 2022-12-27 (×2): 50 mg via ORAL
  Filled 2022-12-27 (×3): qty 2

## 2022-12-27 MED ORDER — SODIUM ZIRCONIUM CYCLOSILICATE 10 G PO PACK
10.0000 g | PACK | Freq: Three times a day (TID) | ORAL | Status: AC
  Administered 2022-12-27 (×2): 10 g via ORAL
  Filled 2022-12-27 (×2): qty 1

## 2022-12-27 MED ORDER — CALCIUM GLUCONATE-NACL 1-0.675 GM/50ML-% IV SOLN
1.0000 g | Freq: Once | INTRAVENOUS | Status: AC
  Administered 2022-12-27: 1000 mg via INTRAVENOUS
  Filled 2022-12-27: qty 50

## 2022-12-27 MED ORDER — HYDRALAZINE HCL 100 MG PO TABS: 50.0000 mg | ORAL_TABLET | Freq: Three times a day (TID) | ORAL | Status: DC

## 2022-12-27 MED ORDER — CHLORHEXIDINE GLUCONATE CLOTH 2 % EX PADS
6.0000 | MEDICATED_PAD | Freq: Every day | CUTANEOUS | Status: DC
  Administered 2022-12-27 – 2022-12-28 (×2): 6 via TOPICAL

## 2022-12-27 MED ORDER — TRAZODONE HCL 50 MG PO TABS
25.0000 mg | ORAL_TABLET | Freq: Every evening | ORAL | Status: DC | PRN
  Administered 2022-12-28: 25 mg via ORAL
  Filled 2022-12-27: qty 1

## 2022-12-27 MED ORDER — LACTATED RINGERS IV BOLUS: 1000.0000 mL | Freq: Once | INTRAVENOUS | Status: DC

## 2022-12-27 MED ORDER — ISOSORBIDE MONONITRATE ER 60 MG PO TB24
30.0000 mg | ORAL_TABLET | Freq: Every day | ORAL | Status: DC
  Administered 2022-12-27 – 2022-12-28 (×2): 30 mg via ORAL
  Filled 2022-12-27 (×2): qty 1

## 2022-12-27 MED ORDER — ALBUTEROL SULFATE (2.5 MG/3ML) 0.083% IN NEBU: 2.5000 mg | INHALATION_SOLUTION | RESPIRATORY_TRACT | Status: DC | PRN

## 2022-12-27 MED ORDER — ORAL CARE MOUTH RINSE: 15.0000 mL | OROMUCOSAL | Status: DC | PRN

## 2022-12-27 MED ORDER — ACETAMINOPHEN 650 MG RE SUPP: 650.0000 mg | Freq: Four times a day (QID) | RECTAL | Status: DC | PRN

## 2022-12-27 MED ORDER — SODIUM CHLORIDE 0.9 % IV SOLN: INTRAVENOUS | Status: AC

## 2022-12-27 MED ORDER — ONDANSETRON HCL 4 MG/2ML IJ SOLN: 4.0000 mg | Freq: Four times a day (QID) | INTRAMUSCULAR | Status: DC | PRN

## 2022-12-27 MED ORDER — PANTOPRAZOLE SODIUM 40 MG PO TBEC
40.0000 mg | DELAYED_RELEASE_TABLET | Freq: Every day | ORAL | Status: DC
  Administered 2022-12-27 – 2022-12-29 (×3): 40 mg via ORAL
  Filled 2022-12-27 (×3): qty 1

## 2022-12-27 MED ORDER — SODIUM BICARBONATE 650 MG PO TABS
1300.0000 mg | ORAL_TABLET | Freq: Two times a day (BID) | ORAL | Status: DC
  Administered 2022-12-27 – 2022-12-29 (×4): 1300 mg via ORAL
  Filled 2022-12-27 (×4): qty 2

## 2022-12-27 MED ORDER — LABETALOL HCL 5 MG/ML IV SOLN
10.0000 mg | INTRAVENOUS | Status: DC | PRN
  Administered 2022-12-27 – 2022-12-29 (×4): 10 mg via INTRAVENOUS
  Filled 2022-12-27 (×7): qty 4

## 2022-12-27 MED ORDER — HEPARIN SODIUM (PORCINE) 5000 UNIT/ML IJ SOLN
5000.0000 [IU] | Freq: Three times a day (TID) | INTRAMUSCULAR | Status: DC
  Administered 2022-12-27 – 2022-12-29 (×6): 5000 [IU] via SUBCUTANEOUS
  Filled 2022-12-27 (×5): qty 1

## 2022-12-27 MED ORDER — ONDANSETRON HCL 4 MG PO TABS: 4.0000 mg | ORAL_TABLET | Freq: Four times a day (QID) | ORAL | Status: DC | PRN

## 2022-12-27 MED ORDER — FERROUS SULFATE 325 (65 FE) MG PO TABS
325.0000 mg | ORAL_TABLET | Freq: Every day | ORAL | Status: DC
  Administered 2022-12-27 – 2022-12-29 (×3): 325 mg via ORAL
  Filled 2022-12-27 (×3): qty 1

## 2022-12-27 MED ORDER — SODIUM CHLORIDE 0.9 % IV BOLUS
1000.0000 mL | Freq: Once | INTRAVENOUS | Status: AC
  Administered 2022-12-27: 1000 mL via INTRAVENOUS

## 2022-12-27 MED ORDER — SODIUM ZIRCONIUM CYCLOSILICATE 10 G PO PACK
10.0000 g | PACK | Freq: Once | ORAL | Status: AC
  Administered 2022-12-27: 10 g via ORAL
  Filled 2022-12-27: qty 1

## 2022-12-27 MED ORDER — CALCITRIOL 0.25 MCG PO CAPS
0.2500 ug | ORAL_CAPSULE | Freq: Every day | ORAL | Status: DC
  Administered 2022-12-27 – 2022-12-28 (×2): 0.25 ug via ORAL
  Filled 2022-12-27 (×3): qty 1

## 2022-12-27 MED ORDER — ACETAMINOPHEN 325 MG PO TABS
650.0000 mg | ORAL_TABLET | Freq: Four times a day (QID) | ORAL | Status: DC | PRN
  Administered 2022-12-28: 650 mg via ORAL
  Filled 2022-12-27: qty 2

## 2022-12-27 NOTE — Consult Note (Signed)
Renal Service Consult Note [facility]  [name] 12/27/2022 Maree Krabbe, MD Requesting Physician: Dr. Kirby Crigler  Reason for Consult: Renal failure HPI: The patient is a 42 y.o. year-old w/ PMH as below who presented to ED today w/ c/o lighheadedness this am. Taking BP meds every other day because they "make her ill".  Pt has CKD V but keeps missing her appts w/ the nephrologist in the OP setting. Lately she has been having morning N/V to the point she can't keep her medications down. In ED bp's were 213/ 125, HR 68, RR 18.  Labs showed bun 50, creat 6.34, K 5.6. Ca 7.2, alb 3.6 , wBC 5K Hb 8.5.  Pt was admitted for renal faliure. We are asked to see in consultation for the same.   Pt states she didn't have insurance for years, until about 6 mos ago she got medicaid. Her PCP ([facility] at [facility]) has referred her to the kidney specialist but she missed the appt the 1st two times and now has missed a 3rd time due to weather.  She has labs from 2021 and 2022 which show advanced renal faluire. Pt states she probably was "referred" to outpatient kidney doctors after those hospital stays, but that she never would have kept those appts due to lack of insurance.   Only nephro consult was from June 2012 for AKI.    Pt denies any loss of energy or appetite. Has been having N/V for months , often related to taking BP pills.  No jerking or seizure like activity.     ROS - denies CP, no joint pain, no HA, no blurry vision, no rash, no dysuria, no difficulty voiding   Past Medical History  Past Medical History:  Diagnosis Date   Anxiety    Asthma    Depression    Diabetes mellitus    Hyperemesis arising during pregnancy    Hypertension    Mental disorder    Past Surgical History  Past Surgical History:  Procedure Laterality Date   ESOPHAGOGASTRODUODENOSCOPY (EGD) WITH PROPOFOL N/A 08/07/2019   Procedure: ESOPHAGOGASTRODUODENOSCOPY (EGD) WITH PROPOFOL;  Surgeon: Hilarie Fredrickson, MD;   Location: [facility] ENDOSCOPY;  Service: Endoscopy;  Laterality: N/A;   NO PAST SURGERIES     TUBAL LIGATION  06/20/2011   Procedure: POST PARTUM TUBAL LIGATION;  Surgeon: Catalina Antigua, MD;  Location: [facility] ORS;  Service: Gynecology;  Laterality: Bilateral;  Bilateral post partum tubal ligation   Family History  Family History  Problem Relation Age of Onset   Heart disease Mother        CHF   Diabetes Sister    Anesthesia problems Neg Hx    Social History  reports that she has never smoked. She has never used smokeless tobacco. She reports that she does not drink alcohol and does not use drugs. Allergies  Allergies  Allergen Reactions   Shellfish Allergy Anaphylaxis and Swelling   Morphine And Codeine Nausea And Vomiting   Home medications Prior to Admission medications   Medication Sig Start Date End Date Taking? Authorizing Provider  acetaminophen (TYLENOL) 500 MG tablet Take 1,000 mg by mouth every 6 (six) hours as needed for moderate pain.   Yes [provider]  calcitRIOL (ROCALTROL) 0.25 MCG capsule Take 1 capsule (0.25 mcg total) by mouth daily. 10/02/22  Yes Carmina Miller, DO  calcium carbonate (TUMS - DOSED IN MG ELEMENTAL CALCIUM) 500 MG chewable tablet Chew 1 tablet (200 mg of elemental calcium total)  by mouth 3 (three) times daily between meals. 10/01/22  Yes Carmina Miller, DO  ferrous sulfate 325 (65 FE) MG tablet Take 1 tablet (325 mg total) by mouth daily. 10/02/22  Yes Carmina Miller, DO  folic acid (FOLVITE) 1 MG tablet Take 1 tablet (1 mg total) by mouth daily. 10/02/22  Yes Carmina Miller, DO  hydrALAZINE (APRESOLINE) 100 MG tablet Take 0.5 tablets (50 mg total) by mouth 3 (three) times daily. 11/29/22  Yes Philomena Doheny, MD  metoprolol succinate (TOPROL-XL) 50 MG 24 hr tablet Take 1.5 tablets (75 mg total) by mouth daily. Take with or immediately following a meal. 11/29/22  Yes Colbert Coyer, Priscila, MD  pantoprazole (PROTONIX) 40 MG tablet Take 1 tablet  (40 mg total) by mouth daily. 10/01/22  Yes Carmina Miller, DO  sodium bicarbonate 650 MG tablet Take 2 tablets (1,300 mg total) by mouth 2 (two) times daily. 10/01/22 10/01/23 Yes Carmina Miller, DO  Vitamin D, Ergocalciferol, (DRISDOL) 1.25 MG (50000 UNIT) CAPS capsule Take 1 capsule (50,000 Units total) by mouth every Monday. 12/06/22  Yes Philomena Doheny, MD     Vitals:   12/27/22 1455 12/27/22 1500 12/27/22 1515 12/27/22 1530  BP: (!) 157/90 (!) 163/75 (!) 157/72 (!) 199/97  Pulse:      Resp: 11 12 17 10   Temp:      TempSrc:      SpO2:      Weight:      Height:       Exam Gen alert, no distress No rash, cyanosis or gangrene Sclera anicteric, throat clear  No jvd or bruits Chest clear bilat to bases, no rales/ wheezing RRR no MRG Abd soft ntnd no mass or ascites +bs GU defer MS no joint effusions or deformity Ext no LE or UE edema, no wounds or ulcers Neuro is alert, Ox 3 , nf      Renal-related home meds: - rocaltrol 0.25 mcg every day - hydralazine 50 tid - toprol xl 75 every day - sod bicarb 2 bid    Date   Creat   eGFR   2020  2.29- 3.03    2021  2.70- 3.18   2022  2.62- 5.46   2023   3.32- 3.63   July 2024 4.7- 5.0   Aug   4.6- 5.1 10- 13 ml/min    Sept  4.92- 5.16 10- 11 ml/min    Nov   6.09   12/27/22 6.34     UA glu to, prot > 300, 0-5 rbc/ wbc  From Aug 2024:    UP/C ratio 08/31/22 -- 2.6 gm / 24  hrs    Urine IF - no M protein present (aug 2024)     UPEP - no M protein     Serum FLC -  2.1, slightly high      Hep C - negative, HIV negative  Assessment/ Plan: AKI on CKD 5 - b/l creatinine 4.6- 5.1, eGFR 10- 13 ml/min.  Creat here was 6.4 on admission in the setting of dizziness and lightheadedness, uncont HTN. Pt has had progressive CKD likely due to diabetes and HTN. + proteinuria is c/w DKD. Myeloma w/u recently was negative, also HIV/ hep C negative. Will send off serologies. Will give IVF"s overnight as she may be volume depleted. No  indication for dialysis at this time. Hopefully will recover some. She is happy to f/u w/ [facility].  HTNsive urgency - resuming her home BB and hydralazine, IV prn  meds also.  Hyperkalemia - renal diet and lokelma      Rob Arlean Hopping  MD [facility] 12/27/2022, 3:55 PM  Recent Labs  Lab 12/27/22 1003  HGB 8.5*  ALBUMIN 3.6  CALCIUM 7.2*  CREATININE 6.34*  K 5.6*   Inpatient medications:  calcitRIOL  0.25 mcg Oral Daily   ferrous sulfate  325 mg Oral Daily   heparin  5,000 Units Subcutaneous Q8H   hydrALAZINE  50 mg Oral TID   metoprolol succinate  75 mg Oral Daily   pantoprazole  40 mg Oral Daily   sodium bicarbonate  1,300 mg Oral BID    acetaminophen **OR** acetaminophen, albuterol, labetalol, ondansetron **OR** ondansetron (ZOFRAN) IV, traZODone

## 2022-12-27 NOTE — H&P (Signed)
History and Physical  [address] ZOX:[id number] DOB: 1980/05/29 DOA: 12/27/2022  PCP: Philomena Doheny, MD   Chief Complaint: Dizziness  HPI: [name] is a 42 y.o. female with medical history significant for hypertension, CKD stage V being admitted to the hospital with dizziness, uncontrolled hypertension and acute on chronic renal failure.  Patient states she has been feeling fine until this morning, when she was feeling little bit dizzy when she was taking her son to work.  Denies any chest pain, headaches, confusion, blurry vision/double vision.  Apparently she has had worsening CKD for quite some time, but has not followed up as directed with nephrology.  She takes metoprolol and hydralazine for blood pressure, but tells me that she feels unwell when she takes the medication every day, show she typically takes it every other day.  Last took her blood pressure medication yesterday.  She does not check her blood pressure at home.  Says she has been urinating fine, normal amount, no dark urine, no blood in her urine.  Review of Systems: Please see HPI for pertinent positives and negatives. A complete 10 system review of systems are otherwise negative.  Past Medical History:  Diagnosis Date   Anxiety    Asthma    Depression    Diabetes mellitus    Hyperemesis arising during pregnancy    Hypertension    Mental disorder    Past Surgical History:  Procedure Laterality Date   ESOPHAGOGASTRODUODENOSCOPY (EGD) WITH PROPOFOL N/A 08/07/2019   Procedure: ESOPHAGOGASTRODUODENOSCOPY (EGD) WITH PROPOFOL;  Surgeon: Hilarie Fredrickson, MD;  Location: [facility] ENDOSCOPY;  Service: Endoscopy;  Laterality: N/A;   NO PAST SURGERIES     TUBAL LIGATION  06/20/2011   Procedure: POST PARTUM TUBAL LIGATION;  Surgeon: Catalina Antigua, MD;  Location: [facility] ORS;  Service: Gynecology;  Laterality: Bilateral;  Bilateral post partum tubal ligation    Social History:  reports that she has never smoked. She has never  used smokeless tobacco. She reports that she does not drink alcohol and does not use drugs.   Allergies  Allergen Reactions   Shellfish Allergy Anaphylaxis and Swelling   Morphine And Codeine Nausea And Vomiting    Family History  Problem Relation Age of Onset   Heart disease Mother        CHF   Diabetes Sister    Anesthesia problems Neg Hx      Prior to Admission medications   Medication Sig Start Date End Date Taking? Authorizing Provider  acetaminophen (TYLENOL) 500 MG tablet Take 1,000 mg by mouth every 6 (six) hours as needed for moderate pain.   Yes [provider]  calcitRIOL (ROCALTROL) 0.25 MCG capsule Take 1 capsule (0.25 mcg total) by mouth daily. 10/02/22  Yes Carmina Miller, DO  calcium carbonate (TUMS - DOSED IN MG ELEMENTAL CALCIUM) 500 MG chewable tablet Chew 1 tablet (200 mg of elemental calcium total) by mouth 3 (three) times daily between meals. 10/01/22  Yes Carmina Miller, DO  ferrous sulfate 325 (65 FE) MG tablet Take 1 tablet (325 mg total) by mouth daily. 10/02/22  Yes Carmina Miller, DO  folic acid (FOLVITE) 1 MG tablet Take 1 tablet (1 mg total) by mouth daily. 10/02/22  Yes Carmina Miller, DO  hydrALAZINE (APRESOLINE) 100 MG tablet Take 0.5 tablets (50 mg total) by mouth 3 (three) times daily. 11/29/22  Yes Philomena Doheny, MD  metoprolol succinate (TOPROL-XL) 50 MG 24 hr tablet Take 1.5 tablets (75 mg total) by mouth daily.  Take with or immediately following a meal. 11/29/22  Yes Colbert Coyer, Priscila, MD  pantoprazole (PROTONIX) 40 MG tablet Take 1 tablet (40 mg total) by mouth daily. 10/01/22  Yes Carmina Miller, DO  sodium bicarbonate 650 MG tablet Take 2 tablets (1,300 mg total) by mouth 2 (two) times daily. 10/01/22 10/01/23 Yes Carmina Miller, DO  Vitamin D, Ergocalciferol, (DRISDOL) 1.25 MG (50000 UNIT) CAPS capsule Take 1 capsule (50,000 Units total) by mouth every Monday. 12/06/22  Yes Philomena Doheny, MD    Physical Exam: BP  (!) 213/125   Pulse 71   Temp 98.2 F (36.8 C) (Oral)   Resp 11   Ht 5\' 7"  (1.702 m)   Wt 83.9 kg   SpO2 100%   BMI 28.98 kg/m   General:  Alert, oriented, calm, in no acute distress  Eyes: EOMI, clear conjuctivae, white sclerea Neck: supple, no masses, trachea mildline  Cardiovascular: RRR, no murmurs or rubs, no peripheral edema  Respiratory: clear to auscultation bilaterally, no wheezes, no crackles  Abdomen: soft, nontender, nondistended, normal bowel tones heard  Skin: dry, no rashes  Musculoskeletal: no joint effusions, normal range of motion  Psychiatric: appropriate affect, normal speech  Neurologic: extraocular muscles intact, clear speech, moving all extremities with intact sensorium         Labs on Admission:  Basic Metabolic Panel: Recent Labs  Lab 12/27/22 1003  NA 137  K 5.6*  CL 112*  CO2 17*  GLUCOSE 121*  BUN 50*  CREATININE 6.34*  CALCIUM 7.2*   Liver Function Tests: Recent Labs  Lab 12/27/22 1003  AST 21  ALT 15  ALKPHOS 73  BILITOT 0.4  PROT 7.3  ALBUMIN 3.6   No results for input(s): "LIPASE", "AMYLASE" in the last 168 hours. No results for input(s): "AMMONIA" in the last 168 hours. CBC: Recent Labs  Lab 12/27/22 1003  WBC 5.1  NEUTROABS 2.8  HGB 8.5*  HCT 27.1*  MCV 80.7  PLT 212   Cardiac Enzymes: No results for input(s): "CKTOTAL", "CKMB", "CKMBINDEX", "TROPONINI" in the last 168 hours.  BNP (last 3 results) No results for input(s): "BNP" in the last 8760 hours.  ProBNP (last 3 results) No results for input(s): "PROBNP" in the last 8760 hours.  CBG: Recent Labs  Lab 12/27/22 0914  GLUCAP 108*    Radiological Exams on Admission: No results found.  Assessment/Plan [name] is a 42 y.o. female with medical history significant for hypertension, CKD stage V being admitted to the hospital with dizziness, uncontrolled hypertension and acute on chronic renal failure.  Hypertensive urgency-initial blood pressure  systolic 170s, has been increasing as the patient has become upset about her renal failure and need for hospital admission.  Without chest pain, acute EKG changes or troponin elevation.  Suspect she feels unwell when her blood pressure normalizes, due to longstanding uncontrolled hypertension -Observation admission with telemetry -Will resume her home metoprolol, and hydralazine -IV labetalol as needed for SBP greater than 180  Acute on chronic kidney disease stage V-presumably due to hypertensive nephropathy but has not had nephrology evaluation.  Seems to have longstanding CKD, unfortunately creatinine has been slowly rising over the last year.  Currently no indication for urgent dialysis.  She has unfortunately not followed up for outpatient nephrology consultation as previously directed. -ER provider discussed with nephrology, who will consult -Continue p.o. bicarb supplementation as well as calcitriol  Hyperkalemia-due to acute on chronic renal failure.  No acute EKG changes. -Lokelma  GERD-Protonix  Iron deficiency anemia-continue ferrous sulfate  DVT prophylaxis: SQ Heparin     Code Status: Full Code  Consults called: Nephrology  Admission status: Observation   Time spent: 56 minutes  [place] Pam Sharlette Dense MD [facility] Pager [phone]  If 7PM-7AM, please contact night-coverage [email or web address] Password [email or web address]  12/27/2022, 1:05 PM

## 2022-12-27 NOTE — ED Notes (Signed)
ED TO INPATIENT HANDOFF REPORT  Name/Age/Gender [name] 42 y.o. female  Code Status    Code Status Orders  (From admission, onward)           Start     Ordered   12/27/22 1305  Full code  Continuous       Question:  By:  Answer:  Consent: discussion documented in EHR   12/27/22 1305           Code Status History     Date Active Date Inactive Code Status Order ID Comments User Context   09/29/2022 2306 10/01/2022 2034 Full Code [id number]  Rana Snare, DO ED   08/11/2022 1550 08/13/2022 1953 Full Code [id number]  Clydie Braun, MD ED   04/05/2021 2154 04/07/2021 2034 Full Code [id number]  Charlsie Quest, MD ED   09/23/2020 0948 09/25/2020 2216 Full Code [id number]  Eliezer Bottom, MD ED   02/05/2020 1458 02/08/2020 1849 Full Code [id number]  Emeline General, MD ED   08/03/2019 1951 08/08/2019 1452 Full Code [id number]  Dorcas Carrow, MD Inpatient   08/24/2018 1056 08/25/2018 1728 Full Code [id number]  Eduard Clos, MD ED   02/15/2016 0001 02/18/2016 2102 Full Code [id number]  Michael Litter, MD Inpatient   01/12/2011 1242 01/14/2011 1338 Full Code [id number]  Danae Orleans, CNM Inpatient       Home/SNF/Other Home  Chief Complaint Acute renal failure superimposed on stage 5 chronic kidney disease, not on chronic dialysis, unspecified acute renal failure type (HCC) [N17.9, N18.5]  Level of Care/Admitting Diagnosis ED Disposition     ED Disposition  Admit   Condition  --   Comment  Hospital Area: [facility] [[id number]]  Level of Care: Telemetry [5]  Admit to tele based on following criteria: Other see comments  Comments: hyperKalemia  May place patient in observation at [facility] or [facility] if equivalent level of care is available:: Yes  Covid Evaluation: Asymptomatic - no recent exposure (last 10 days) testing not required  Diagnosis: Acute renal failure superimposed on stage 5 chronic kidney disease, not on chronic dialysis, unspecified acute  renal failure type [facility]) [[id number]]  Admitting Physician: Maryln Gottron [[id number]]  Attending Physician: Olexa.Dam, MIR Jaxson.Roy [[id number]]          Medical History Past Medical History:  Diagnosis Date   Anxiety    Asthma    Depression    Diabetes mellitus    Hyperemesis arising during pregnancy    Hypertension    Mental disorder     Allergies Allergies  Allergen Reactions   Shellfish Allergy Anaphylaxis and Swelling   Morphine And Codeine Nausea And Vomiting    IV Location/Drains/Wounds Patient Lines/Drains/Airways Status     Active Line/Drains/Airways     Name Placement date Placement time Site Days   Peripheral IV 12/27/22 20 G Left Antecubital 12/27/22  1007  Antecubital  less than 1            Labs/Imaging Results for orders placed or performed during the hospital encounter of 12/27/22 (from the past 48 hour(s))  CBG monitoring, ED     Status: Abnormal   Collection Time: 12/27/22  9:14 AM  Result Value Ref Range   Glucose-Capillary 108 (H) 70 - 99 mg/dL    Comment: Glucose reference range applies only to samples taken after fasting for at least 8 hours.  Comprehensive metabolic panel     Status: Abnormal   Collection Time: 12/27/22 10:03 AM  Result Value Ref Range   Sodium 137 135 - 145 mmol/L   Potassium 5.6 (H) 3.5 - 5.1 mmol/L   Chloride 112 (H) 98 - 111 mmol/L   CO2 17 (L) 22 - 32 mmol/L   Glucose, Bld 121 (H) 70 - 99 mg/dL    Comment: Glucose reference range applies only to samples taken after fasting for at least 8 hours.   BUN 50 (H) 6 - 20 mg/dL   Creatinine, Ser 1.47 (H) 0.44 - 1.00 mg/dL   Calcium 7.2 (L) 8.9 - 10.3 mg/dL   Total Protein 7.3 6.5 - 8.1 g/dL   Albumin 3.6 3.5 - 5.0 g/dL   AST 21 15 - 41 U/L   ALT 15 0 - 44 U/L   Alkaline Phosphatase 73 38 - 126 U/L   Total Bilirubin 0.4 <1.2 mg/dL   GFR, Estimated 8 (L) >60 mL/min    Comment: (NOTE) Calculated using the CKD-EPI Creatinine Equation (2021)    Anion gap 8 5 - 15     Comment: Performed at [facility], [address], [place], [place] [address]  CBC with Differential     Status: Abnormal   Collection Time: 12/27/22 10:03 AM  Result Value Ref Range   WBC 5.1 4.0 - 10.5 K/uL   RBC 3.36 (L) 3.87 - 5.11 MIL/uL   Hemoglobin 8.5 (L) 12.0 - 15.0 g/dL   HCT 21.3 (L) 08.6 - 57.8 %   MCV 80.7 80.0 - 100.0 fL   MCH 25.3 (L) 26.0 - 34.0 pg   MCHC 31.4 30.0 - 36.0 g/dL   RDW 46.9 (H) 62.9 - 52.8 %   Platelets 212 150 - 400 K/uL   nRBC 0.0 0.0 - 0.2 %   Neutrophils Relative % 54 %   Neutro Abs 2.8 1.7 - 7.7 K/uL   Lymphocytes Relative 36 %   Lymphs Abs 1.8 0.7 - 4.0 K/uL   Monocytes Relative 8 %   Monocytes Absolute 0.4 0.1 - 1.0 K/uL   Eosinophils Relative 1 %   Eosinophils Absolute 0.1 0.0 - 0.5 K/uL   Basophils Relative 1 %   Basophils Absolute 0.0 0.0 - 0.1 K/uL   Immature Granulocytes 0 %   Abs Immature Granulocytes 0.01 0.00 - 0.07 K/uL    Comment: Performed at [facility], [address], [place], [place] [address]  hCG, quantitative, pregnancy     Status: None   Collection Time: 12/27/22 10:03 AM  Result Value Ref Range   hCG, Beta Chain, Quant, S 1 <5 mIU/mL    Comment:          GEST. AGE      CONC.  (mIU/mL)   <=1 WEEK        5 - 50     2 WEEKS       50 - 500     3 WEEKS       100 - 10,000     4 WEEKS     1,000 - 30,000     5 WEEKS     3,500 - 115,000   6-8 WEEKS     12,000 - 270,000    12 WEEKS     15,000 - 220,000        FEMALE AND NON-PREGNANT FEMALE:     LESS THAN 5 mIU/mL Performed at [facility], [address], [place], [place] [address]   Troponin I (High Sensitivity)     Status: None  Collection Time: 12/27/22 10:03 AM  Result Value Ref Range   Troponin I (High Sensitivity) 16 <18 ng/L    Comment: (NOTE) Elevated high sensitivity troponin I (hsTnI) values and significant  changes across serial measurements may suggest ACS but many other  chronic and acute conditions  are known to elevate hsTnI results.  Refer to the "Links" section for chest pain algorithms and additional  guidance. Performed at [facility], [address], [place], [place] [address]   Urinalysis, Routine w reflex microscopic -Urine, Clean Catch     Status: Abnormal   Collection Time: 12/27/22 10:33 AM  Result Value Ref Range   Color, Urine STRAW (A) YELLOW   APPearance CLEAR CLEAR   Specific Gravity, Urine 1.013 1.005 - 1.030   pH 6.0 5.0 - 8.0   Glucose, UA 50 (A) NEGATIVE mg/dL   Hgb urine dipstick NEGATIVE NEGATIVE   Bilirubin Urine NEGATIVE NEGATIVE   Ketones, ur NEGATIVE NEGATIVE mg/dL   Protein, ur >=604 (A) NEGATIVE mg/dL   Nitrite NEGATIVE NEGATIVE   Leukocytes,Ua NEGATIVE NEGATIVE   RBC / HPF 0-5 0 - 5 RBC/hpf   WBC, UA 0-5 0 - 5 WBC/hpf   Bacteria, UA RARE (A) NONE SEEN   Squamous Epithelial / HPF 0-5 0 - 5 /HPF    Comment: Performed at [facility], [address], [place], [place] [address]   No results found.  Pending Labs Unresulted Labs (From admission, onward)     Start     Ordered   12/28/22 0500  Basic metabolic panel  Tomorrow morning,   R        12/27/22 1305   12/28/22 0500  CBC  Tomorrow morning,   R        12/27/22 1305            Vitals/Pain Today's Vitals   12/27/22 0915 12/27/22 0920 12/27/22 0933 12/27/22 1230  BP: (!) 170/98  (!) 174/95 (!) 213/125  Pulse: 78  71   Resp: 16  16 11   Temp: 98.2 F (36.8 C)  98.2 F (36.8 C)   TempSrc: Oral  Oral   SpO2: 100%  100%   Weight: 83.9 kg     Height: 5\' 7"  (1.702 m)     PainSc:  0-No pain      Isolation Precautions No active isolations  Medications Medications  metoprolol succinate (TOPROL-XL) 24 hr tablet 75 mg (has no administration in time range)  calcitRIOL (ROCALTROL) capsule 0.25 mcg (has no administration in time range)  sodium bicarbonate tablet 1,300 mg (has no administration in time range)  pantoprazole (PROTONIX) EC tablet 40  mg (has no administration in time range)  ferrous sulfate tablet 325 mg (has no administration in time range)  heparin injection 5,000 Units (has no administration in time range)  acetaminophen (TYLENOL) tablet 650 mg (has no administration in time range)    Or  acetaminophen (TYLENOL) suppository 650 mg (has no administration in time range)  traZODone (DESYREL) tablet 25 mg (has no administration in time range)  ondansetron (ZOFRAN) tablet 4 mg (has no administration in time range)    Or  ondansetron (ZOFRAN) injection 4 mg (has no administration in time range)  albuterol (PROVENTIL) (2.5 MG/3ML) 0.083% nebulizer solution 2.5 mg (has no administration in time range)  labetalol (NORMODYNE) injection 10 mg (has no administration in time range)  hydrALAZINE (APRESOLINE) tablet 50 mg (has no administration in time range)  sodium zirconium cyclosilicate (LOKELMA) packet  10 g (10 g Oral Given 12/27/22 1121)  calcium gluconate 1 g/ 50 mL sodium chloride IVPB (0 mg Intravenous Stopped 12/27/22 1244)    Mobility walks

## 2022-12-27 NOTE — ED Triage Notes (Signed)
Pt arrived via POV. C/o of lightheadedness that began after they woke up this AM. No other complaints.   Pt has been taking BP meds every other day, states they make her feel ill.  AOx4

## 2022-12-27 NOTE — Plan of Care (Signed)
  Problem: Education: Goal: Knowledge of General Education information will improve Description: Including pain rating scale, medication(s)/side effects and non-pharmacologic comfort measures Outcome: Progressing   Problem: Coping: Goal: Level of anxiety will decrease Outcome: Progressing   

## 2022-12-27 NOTE — ED Provider Notes (Signed)
[place] EMERGENCY DEPARTMENT AT [facility] Provider Note   CSN: [id number] Arrival date & time: 12/27/22  4782     History  Chief Complaint  Patient presents with   Dizziness    [name] is a 42 y.o. female, hypertension, who presents to the ED after feeling lightheaded this morning.  She states when she gets up and walks, she feels lightheaded, notes that the only thing that helps is laying down, and resting her eyes.  Denies any kind of pain including chest pain, shortness of breath, abdominal pain, headache, or nausea or vomiting.  She has not passed out at all, just feels very lightheaded, and weak.  Reports that she has been intermittently taking her hydralazine and beta-blocker, because she does not like taking them on a daily basis.  She states she does not know if that has anything to do with it.  Is not having any new vision changes, changes in speech or weakness in one-sided the body.     Home Medications Prior to Admission medications   Medication Sig Start Date End Date Taking? Authorizing Provider  acetaminophen (TYLENOL) 500 MG tablet Take 1,000 mg by mouth every 6 (six) hours as needed for moderate pain.   Yes [provider]  calcitRIOL (ROCALTROL) 0.25 MCG capsule Take 1 capsule (0.25 mcg total) by mouth daily. 10/02/22  Yes Carmina Miller, DO  calcium carbonate (TUMS - DOSED IN MG ELEMENTAL CALCIUM) 500 MG chewable tablet Chew 1 tablet (200 mg of elemental calcium total) by mouth 3 (three) times daily between meals. 10/01/22  Yes Carmina Miller, DO  ferrous sulfate 325 (65 FE) MG tablet Take 1 tablet (325 mg total) by mouth daily. 10/02/22  Yes Carmina Miller, DO  folic acid (FOLVITE) 1 MG tablet Take 1 tablet (1 mg total) by mouth daily. 10/02/22  Yes Carmina Miller, DO  hydrALAZINE (APRESOLINE) 100 MG tablet Take 0.5 tablets (50 mg total) by mouth 3 (three) times daily. 11/29/22  Yes Philomena Doheny, MD  metoprolol succinate (TOPROL-XL) 50  MG 24 hr tablet Take 1.5 tablets (75 mg total) by mouth daily. Take with or immediately following a meal. 11/29/22  Yes Colbert Coyer, Priscila, MD  pantoprazole (PROTONIX) 40 MG tablet Take 1 tablet (40 mg total) by mouth daily. 10/01/22  Yes Carmina Miller, DO  sodium bicarbonate 650 MG tablet Take 2 tablets (1,300 mg total) by mouth 2 (two) times daily. 10/01/22 10/01/23 Yes Carmina Miller, DO  Vitamin D, Ergocalciferol, (DRISDOL) 1.25 MG (50000 UNIT) CAPS capsule Take 1 capsule (50,000 Units total) by mouth every Monday. 12/06/22  Yes Philomena Doheny, MD      Allergies    Shellfish allergy and Morphine and codeine    Review of Systems   Review of Systems  Respiratory:  Negative for shortness of breath.   Cardiovascular:  Negative for chest pain.  Neurological:  Positive for dizziness. Negative for headaches.    Physical Exam Updated Vital Signs BP (!) 207/87   Pulse 62   Temp 98.4 F (36.9 C) (Oral)   Resp (!) 9   Ht 5\' 7"  (1.702 m)   Wt 83.9 kg   SpO2 100%   BMI 28.98 kg/m  Physical Exam Vitals and nursing note reviewed.  Constitutional:      General: She is not in acute distress.    Appearance: She is well-developed.  HENT:     Head: Normocephalic and atraumatic.  Eyes:     Conjunctiva/sclera: Conjunctivae normal.  Cardiovascular:     Rate and Rhythm: Normal rate and regular rhythm.     Heart sounds: No murmur heard. Pulmonary:     Effort: Pulmonary effort is normal. No respiratory distress.     Breath sounds: Normal breath sounds.  Abdominal:     Palpations: Abdomen is soft.     Tenderness: There is no abdominal tenderness.  Musculoskeletal:        General: No swelling.     Cervical back: Neck supple.  Skin:    General: Skin is warm and dry.     Capillary Refill: Capillary refill takes less than 2 seconds.  Neurological:     General: No focal deficit present.     Mental Status: She is alert and oriented to person, place, and time.     Cranial  Nerves: No cranial nerve deficit.     Sensory: No sensory deficit.     Motor: No weakness.     Coordination: Coordination normal.  Psychiatric:        Mood and Affect: Mood normal.     ED Results / Procedures / Treatments   Labs (all labs ordered are listed, but only abnormal results are displayed) Labs Reviewed  COMPREHENSIVE METABOLIC PANEL - Abnormal; Notable for the following components:      Result Value   Potassium 5.6 (*)    Chloride 112 (*)    CO2 17 (*)    Glucose, Bld 121 (*)    BUN 50 (*)    Creatinine, Ser 6.34 (*)    Calcium 7.2 (*)    GFR, Estimated 8 (*)    All other components within normal limits  CBC WITH DIFFERENTIAL/PLATELET - Abnormal; Notable for the following components:   RBC 3.36 (*)    Hemoglobin 8.5 (*)    HCT 27.1 (*)    MCH 25.3 (*)    RDW 16.4 (*)    All other components within normal limits  URINALYSIS, ROUTINE W REFLEX MICROSCOPIC - Abnormal; Notable for the following components:   Color, Urine STRAW (*)    Glucose, UA 50 (*)    Protein, ur >=300 (*)    Bacteria, UA RARE (*)    All other components within normal limits  CBG MONITORING, ED - Abnormal; Notable for the following components:   Glucose-Capillary 108 (*)    All other components within normal limits  HCG, QUANTITATIVE, PREGNANCY  CBG MONITORING, ED  TROPONIN I (HIGH SENSITIVITY)    EKG None  Radiology No results found.  Procedures Procedures    Medications Ordered in ED Medications  metoprolol succinate (TOPROL-XL) 24 hr tablet 75 mg (75 mg Oral Given 12/27/22 1341)  calcitRIOL (ROCALTROL) capsule 0.25 mcg (has no administration in time range)  sodium bicarbonate tablet 1,300 mg (has no administration in time range)  pantoprazole (PROTONIX) EC tablet 40 mg (40 mg Oral Given 12/27/22 1341)  ferrous sulfate tablet 325 mg (325 mg Oral Given 12/27/22 1341)  heparin injection 5,000 Units (has no administration in time range)  acetaminophen (TYLENOL) tablet 650 mg (has no  administration in time range)    Or  acetaminophen (TYLENOL) suppository 650 mg (has no administration in time range)  traZODone (DESYREL) tablet 25 mg (has no administration in time range)  ondansetron (ZOFRAN) tablet 4 mg (has no administration in time range)    Or  ondansetron (ZOFRAN) injection 4 mg (has no administration in time range)  albuterol (PROVENTIL) (2.5 MG/3ML) 0.083% nebulizer solution 2.5 mg (has no administration in  time range)  labetalol (NORMODYNE) injection 10 mg (has no administration in time range)  hydrALAZINE (APRESOLINE) tablet 50 mg (50 mg Oral Given 12/27/22 1342)  sodium zirconium cyclosilicate (LOKELMA) packet 10 g (10 g Oral Given 12/27/22 1121)  calcium gluconate 1 g/ 50 mL sodium chloride IVPB (0 mg Intravenous Stopped 12/27/22 1244)    ED Course/ Medical Decision Making/ A&P                                 Medical Decision Making Patient is a 42 year old female, here for feeling lightheaded, unwell for the last morning.  She has not been taking her medications as prescribed because she has some nausea/vomiting with them.  Denies any weakness on one side of the body, chest pain or shortness of breath.  Feels just lightheaded, and is slightly dizzy.  We will obtain electrolytes, EKG, troponins for further evaluation.  She has no focal neurodeficits thus we will hold off on head CT at this time.  Amount and/or Complexity of Data Reviewed Labs: ordered.    Details: Hyperkalemic at 5.6, hypocalcemic, at 7.2, creatinine at 6+ similar to prior Discussion of management or test interpretation with external provider(s): Patient is currently not on dialysis, still making urine, we will give her dose of Lokelma 10 g, as well as calcium gluconate given her low calcium.  I reached out to nephrology, after speaking with Dr. Jeanelle Malling, and spoke with Dr. Arlean Hopping, he recommends admission as she will need further evaluation for the cause of her CKD 5, and for evaluation for possible  dialysis/need for dialysis.  Will also need further management of her electrolytes including her potassium, and calcium.  Admitted to Dr. Erenest Blank.   Risk Prescription drug management. Decision regarding hospitalization.   Final Clinical Impression(s) / ED Diagnoses Final diagnoses:  CKD (chronic kidney disease), stage V (HCC)  Hypocalcemia  Hypokalemia    Rx / DC Orders ED Discharge Orders     None         Xzayvion Vaeth, Harley Alto, PA 12/27/22 1438    Arby Barrette, MD 12/31/22 1913

## 2022-12-28 ENCOUNTER — Encounter (HOSPITAL_COMMUNITY): Payer: Self-pay | Admitting: Internal Medicine

## 2022-12-28 DIAGNOSIS — N179 Acute kidney failure, unspecified: Secondary | ICD-10-CM | POA: Diagnosis not present

## 2022-12-28 DIAGNOSIS — N185 Chronic kidney disease, stage 5: Secondary | ICD-10-CM | POA: Diagnosis not present

## 2022-12-28 LAB — BASIC METABOLIC PANEL
Anion gap: 9 (ref 5–15)
BUN: 46 mg/dL — ABNORMAL HIGH (ref 6–20)
CO2: 14 mmol/L — ABNORMAL LOW (ref 22–32)
Calcium: 7.2 mg/dL — ABNORMAL LOW (ref 8.9–10.3)
Chloride: 115 mmol/L — ABNORMAL HIGH (ref 98–111)
Creatinine, Ser: 5.72 mg/dL — ABNORMAL HIGH (ref 0.44–1.00)
GFR, Estimated: 9 mL/min — ABNORMAL LOW (ref >60)
Glucose, Bld: 95 mg/dL (ref 70–99)
Potassium: 4 mmol/L (ref 3.5–5.1)
Sodium: 138 mmol/L (ref 135–145)

## 2022-12-28 LAB — C4 COMPLEMENT: Complement C4, Body Fluid: 47 mg/dL — ABNORMAL HIGH (ref 12–38)

## 2022-12-28 LAB — ANCA TITERS
Atypical P-ANCA titer: <1:20 {titer}
C-ANCA: <1:20 {titer}
P-ANCA: <1:20 {titer}

## 2022-12-28 LAB — CBC
HCT: 25.1 % — ABNORMAL LOW (ref 36.0–46.0)
Hemoglobin: 7.6 g/dL — ABNORMAL LOW (ref 12.0–15.0)
MCH: 25 pg — ABNORMAL LOW (ref 26.0–34.0)
MCHC: 30.3 g/dL (ref 30.0–36.0)
MCV: 82.6 fL (ref 80.0–100.0)
Platelets: 196 10*3/uL (ref 150–400)
RBC: 3.04 MIL/uL — ABNORMAL LOW (ref 3.87–5.11)
RDW: 16.3 % — ABNORMAL HIGH (ref 11.5–15.5)
WBC: 5.2 10*3/uL (ref 4.0–10.5)
nRBC: 0 % (ref 0.0–0.2)

## 2022-12-28 LAB — GLOMERULAR BASEMENT MEMBRANE ANTIBODIES: GBM Ab: <0.2 U (ref 0.0–0.9)

## 2022-12-28 LAB — C3 COMPLEMENT: C3 Complement: 116 mg/dL (ref 82–167)

## 2022-12-28 LAB — ANTISTREPTOLYSIN O TITER: ASO: 43 [IU]/mL (ref 0.0–200.0)

## 2022-12-28 LAB — ANTINUCLEAR ANTIBODIES, IFA: ANA Ab, IFA: NEGATIVE

## 2022-12-28 MED ORDER — STERILE WATER FOR INJECTION IV SOLN
INTRAMUSCULAR | Status: DC
  Filled 2022-12-28: qty 1000
  Filled 2022-12-28: qty 150
  Filled 2022-12-28: qty 1000

## 2022-12-28 MED ORDER — HYDRALAZINE HCL 50 MG PO TABS
100.0000 mg | ORAL_TABLET | Freq: Three times a day (TID) | ORAL | Status: DC
  Administered 2022-12-28 – 2022-12-29 (×4): 100 mg via ORAL
  Filled 2022-12-28 (×4): qty 2

## 2022-12-28 MED ORDER — FOLIC ACID 1 MG PO TABS
1.0000 mg | ORAL_TABLET | Freq: Every day | ORAL | Status: DC
  Administered 2022-12-28 – 2022-12-29 (×2): 1 mg via ORAL
  Filled 2022-12-28 (×2): qty 1

## 2022-12-28 MED ORDER — HYDRALAZINE HCL 20 MG/ML IJ SOLN
10.0000 mg | Freq: Three times a day (TID) | INTRAMUSCULAR | Status: DC | PRN
  Administered 2022-12-28: 10 mg via INTRAVENOUS
  Filled 2022-12-28 (×2): qty 1

## 2022-12-28 MED ORDER — HYDROXYZINE HCL 10 MG PO TABS
5.0000 mg | ORAL_TABLET | Freq: Once | ORAL | Status: AC | PRN
  Administered 2022-12-28: 5 mg via ORAL
  Filled 2022-12-28: qty 1

## 2022-12-28 NOTE — Plan of Care (Signed)
  Problem: Clinical Measurements: Goal: Will remain free from infection Outcome: Progressing Goal: Respiratory complications will improve Outcome: Progressing   Problem: Activity: Goal: Risk for activity intolerance will decrease Outcome: Progressing   Problem: Nutrition: Goal: Adequate nutrition will be maintained Outcome: Progressing   Problem: Coping: Goal: Level of anxiety will decrease Outcome: Progressing   Problem: Pain Management: Goal: General experience of comfort will improve Outcome: Progressing   Problem: Safety: Goal: Ability to remain free from injury will improve Outcome: Progressing   Problem: Skin Integrity: Goal: Risk for impaired skin integrity will decrease Outcome: Progressing

## 2022-12-28 NOTE — Progress Notes (Signed)
PROGRESS NOTE    [name]  ZOX:[id number] DOB: 08-Jan-1981 DOA: 12/27/2022 PCP: Philomena Doheny, MD   Brief Narrative:  HPI: [name] is a 42 y.o. female with medical history significant for hypertension, CKD stage V being admitted to the hospital with dizziness, uncontrolled hypertension and acute on chronic renal failure.  Patient states she has been feeling fine until this morning, when she was feeling little bit dizzy when she was taking her son to work.  Denies any chest pain, headaches, confusion, blurry vision/double vision.  Apparently she has had worsening CKD for quite some time, but has not followed up as directed with nephrology.  She takes metoprolol and hydralazine for blood pressure, but tells me that she feels unwell when she takes the medication every day, show she typically takes it every other day.  Last took her blood pressure medication yesterday.  She does not check her blood pressure at home.  Says she has been urinating fine, normal amount, no dark urine, no blood in her urine.   Assessment & Plan:   Principal Problem:   Acute renal failure superimposed on stage 5 chronic kidney disease, not on chronic dialysis, unspecified acute renal failure type [facility]) Active Problems:   Hypertensive urgency  Hypertensive urgency-initial blood pressure systolic as high as 238 and diastolic 125.  Unsure about compliance.  Resumed all home medications.  Blood pressure still elevated.  Will now increase hydralazine from 50 to 100 mg 3 times daily, continue Imdur at current dose of 30 and Toprol-XL at 75, continue as needed labetalol and add as needed hydralazine.  Will assess and increase dosages of the rest of the medications as indicated.   Acute on chronic kidney disease stage V with metabolic acidosis-baseline creatinine 4.6-5.1.  Presented with creatinine of 6.4 with symptoms of dizziness and lightheadedness.  Likely hypertensive nephropathy.  Has missed several  nephrology appointments as outpatient.  Currently on IV fluids, nephrology managing and has ordered several labs.  Also on bicarbonate p.o.  Avoid nephrotoxic agents.  Creatinine slightly improved.   Hypocalcemia: Per nephrology.  Hyperkalemia-resolved with Lokelma.  GERD-Protonix   Anemia of chronic disease: Secondary to renal disease.  Her baseline hemoglobin appears to be between 7-9.  Currently 7.6.  Monitor daily and transfuse if less than 7.  Folate deficiency: Folic acid checked 2 months ago was 4.6.  Will resume folic acid supplement.  DVT prophylaxis: heparin injection 5,000 Units Start: 12/27/22 1400 SCDs Start: 12/27/22 1304   Code Status: Full Code  Family Communication:  None present at bedside.  Plan of care discussed with patient in length and he/she verbalized understanding and agreed with it.  Status is: Inpatient Remains inpatient appropriate because: Creatinine still elevated and blood pressure uncontrolled.   Estimated body mass index is 28 kg/m as calculated from the following:   Height as of this encounter: 5\' 7"  (1.702 m).   Weight as of this encounter: 81.1 kg.    Nutritional Assessment: Body mass index is 28 kg/m.Marland Kitchen Seen by dietician.  I agree with the assessment and plan as outlined below: Nutrition Status:        . Skin Assessment: I have examined the patient's skin and I agree with the wound assessment as performed by the wound care RN as outlined below:    Consultants:  Nephrology  Procedures:  None  Antimicrobials:  Anti-infectives (From admission, onward)    None         Subjective: Patient seen and examined.  She has no complaint at all.  Objective: Vitals:   12/28/22 0600 12/28/22 0630 12/28/22 0633 12/28/22 0700  BP: (!) 179/62 (!) 200/80 (!) 196/77   Pulse: 86 71  80  Resp: (!) 21 13  10   Temp:      TempSrc:      SpO2: 100% 100%  100%  Weight:      Height:        Intake/Output Summary (Last 24 hours) at  12/28/2022 0749 Last data filed at 12/28/2022 9604 Gross per 24 hour  Intake 2199.52 ml  Output 400 ml  Net 1799.52 ml   Filed Weights   12/27/22 0915 12/27/22 1549 12/28/22 0500  Weight: 83.9 kg 78.4 kg 81.1 kg    Examination:  General exam: Appears calm and comfortable  Respiratory system: Clear to auscultation. Respiratory effort normal. Cardiovascular system: S1 & S2 heard, RRR. No JVD, murmurs, rubs, gallops or clicks. No pedal edema. Gastrointestinal system: Abdomen is nondistended, soft and nontender. No organomegaly or masses felt. Normal bowel sounds heard. Central nervous system: Alert and oriented. No focal neurological deficits. Extremities: Symmetric 5 x 5 power. Skin: No rashes, lesions or ulcers Psychiatry: Judgement and insight appear normal. Mood & affect appropriate.    Data Reviewed: I have personally reviewed following labs and imaging studies  CBC: Recent Labs  Lab 12/27/22 1003 12/28/22 0259  WBC 5.1 5.2  NEUTROABS 2.8  --   HGB 8.5* 7.6*  HCT 27.1* 25.1*  MCV 80.7 82.6  PLT 212 196   Basic Metabolic Panel: Recent Labs  Lab 12/27/22 1003 12/28/22 0259  NA 137 138  K 5.6* 4.0  CL 112* 115*  CO2 17* 14*  GLUCOSE 121* 95  BUN 50* 46*  CREATININE 6.34* 5.72*  CALCIUM 7.2* 7.2*   GFR: Estimated Creatinine Clearance: 13.8 mL/min (A) (by C-G formula based on SCr of 5.72 mg/dL (H)). Liver Function Tests: Recent Labs  Lab 12/27/22 1003  AST 21  ALT 15  ALKPHOS 73  BILITOT 0.4  PROT 7.3  ALBUMIN 3.6   No results for input(s): "LIPASE", "AMYLASE" in the last 168 hours. No results for input(s): "AMMONIA" in the last 168 hours. Coagulation Profile: No results for input(s): "INR", "PROTIME" in the last 168 hours. Cardiac Enzymes: No results for input(s): "CKTOTAL", "CKMB", "CKMBINDEX", "TROPONINI" in the last 168 hours. BNP (last 3 results) No results for input(s): "PROBNP" in the last 8760 hours. HbA1C: No results for input(s):  "HGBA1C" in the last 72 hours. CBG: Recent Labs  Lab 12/27/22 0914  GLUCAP 108*   Lipid Profile: No results for input(s): "CHOL", "HDL", "LDLCALC", "TRIG", "CHOLHDL", "LDLDIRECT" in the last 72 hours. Thyroid Function Tests: No results for input(s): "TSH", "T4TOTAL", "FREET4", "T3FREE", "THYROIDAB" in the last 72 hours. Anemia Panel: No results for input(s): "VITAMINB12", "FOLATE", "FERRITIN", "TIBC", "IRON", "RETICCTPCT" in the last 72 hours. Sepsis Labs: No results for input(s): "PROCALCITON", "LATICACIDVEN" in the last 168 hours.  Recent Results (from the past 240 hour(s))  MRSA Next Gen by PCR, Nasal     Status: None   Collection Time: 12/27/22  4:08 PM   Specimen: Nasal Mucosa; Nasal Swab  Result Value Ref Range Status   MRSA by PCR Next Gen NOT DETECTED NOT DETECTED Final    Comment: (NOTE) The GeneXpert MRSA Assay (FDA approved for NASAL specimens only), is one component of a comprehensive MRSA colonization surveillance program. It is not intended to diagnose MRSA infection nor to guide or monitor treatment for MRSA  infections. Test performance is not FDA approved in patients less than 49 years old. Performed at [facility], [address], [place], [place] [address]      Radiology Studies: US RENAL  Result Date: 12/27/2022 CLINICAL DATA:  Acute renal failure on stage 5 chronic kidney disease EXAM: RENAL / URINARY TRACT ULTRASOUND COMPLETE COMPARISON:  08/12/2022 FINDINGS: Right Kidney: Renal measurements: 8.7 x 3.6 x 5.3 cm = volume: 87 mL. 11 mm cyst off the upper pole of the right kidney. Diffuse atrophy. Increased echotexture. No suspicious mass or hydronephrosis. Left Kidney: Renal measurements: 8.4 x 4.1 x 5.4 cm = volume: 97 mL. Diffusely increased echotexture. No mass or hydronephrosis. Bladder: Appears normal for degree of bladder distention. Other: None. IMPRESSION: Atrophic kidneys bilaterally, right greater than left. Increased echotexture  compatible with chronic medical renal disease. No acute findings. Electronically Signed   By: Charlett Nose M.D.   On: 12/27/2022 22:53    Scheduled Meds:  calcitRIOL  0.25 mcg Oral Daily   Chlorhexidine Gluconate Cloth  6 each Topical Daily   ferrous sulfate  325 mg Oral Daily   heparin  5,000 Units Subcutaneous Q8H   hydrALAZINE  100 mg Oral TID   isosorbide mononitrate  30 mg Oral Daily   metoprolol succinate  75 mg Oral Daily   pantoprazole  40 mg Oral Daily   sodium bicarbonate  1,300 mg Oral BID   Continuous Infusions:  sodium chloride 75 mL/hr at 12/28/22 6962     LOS: 1 day   Hughie Closs, MD [facility]  12/28/2022, 7:49 AM   *Please note that this is a verbal dictation therefore any spelling or grammatical errors are due to the "Dragon [employer] One" system interpretation.  Please page via Amion and do not message via secure chat for urgent patient care matters. Secure chat can be used for non urgent patient care matters.  How to contact the [facility] Attending or Consulting provider 7A - 7P or covering provider during after hours 7P -7A, for this patient?  Check the care team in [facility] and look for a) attending/consulting [facility] provider listed and b) the [facility] team listed. Page or secure chat 7A-7P. Log into [email or web address] and use [facility] universal password to access. If you do not have the password, please contact the hospital operator. Locate the [facility] provider you are looking for under [facility] and page to a number that you can be directly reached. If you still have difficulty reaching the provider, please page the [facility] (Director on Call) for the Hospitalists listed on amion for assistance.

## 2022-12-28 NOTE — Progress Notes (Signed)
[facility] Progress Note  Subjective: seen in room, creat down 5.7.  Hb down to mid 7s. No bleeding c/o's.   Vitals:   12/28/22 1000 12/28/22 1040 12/28/22 1150 12/28/22 1158  BP: 138/61 (!) 137/44  (!) 151/70  Pulse: 82 71  85  Resp: 16 15  14   Temp:   98.1 F (36.7 C)   TempSrc:   Oral   SpO2: 100% 100%  100%  Weight:      Height:        Exam: Gen alert, no distress No rash, cyanosis or gangrene Sclera anicteric, throat clear  No jvd or bruits Chest clear bilat to bases, no rales/ wheezing RRR no MRG Abd soft ntnd no mass or ascites +bs Ext no LE or UE edema Neuro is alert, Ox 3 , nf        Renal-related home meds: - rocaltrol 0.25 mcg every day - hydralazine 50 tid - toprol xl 75 every day - sod bicarb 2 bid     Date               Creat               eGFR   2020              2.29- 3.03           2021              2.70- 3.18   2022              2.62- 5.46   2023              3.32- 3.63   July 2024       4.7- 5.0   Aug                4.6- 5.1            10- 13 ml/min     Sept               4.92- 5.16        10- 11 ml/min     Nov                6.09   12/27/22        6.34     UA glu to, prot > 300, 0-5 rbc/ wbc  From Aug 2024:    UP/C ratio 08/31/22 -- 2.6 gm / 24  hrs    Urine IF - no M protein present (aug 2024)     UPEP - no M protein     Serum FLC -  2.1, slightly high      Hep C - negative, HIV negative   Assessment/ Plan: AKI on CKD 5 - b/l creatinine 4.6- 5.1, eGFR 10- 13 ml/min.  Creat here was 6.4 on admission in the setting of dizziness and lightheadedness, uncont HTN. Pt has had progressive CKD likely due to diabetes and HTN. Has never seen a renal doctor. She got insurance (medicaid) several mos ago and has been referred to [facility] but missed 1 or 2 appts due to weather.  + proteinuria suggest DKD. Myeloma markers and hep C / HIV were negative in Aug 2024. Serologies drawn and results pending. Creatinine improved some overnight w/ IVF"s  (6.3 --> 5.7).  She has an appt Thursday 12/12 w/ Glenna Fellows at [facility]. Will cont IVF"s, change to bicarb gtt. F/u labs in am.  Anemia - Hb  dropped to 7s today. Per pmd will keep her to be sure this stabilizes.  HTNsive urgency - BP's better on her home meds hydralazine and metoprolol xl.  Hyperkalemia - renal diet and lokelma, K+ 4.0 today.     Vinson Moselle MD  [facility] 12/28/2022, 1:10 PM  Recent Labs  Lab 12/27/22 1003 12/28/22 0259  HGB 8.5* 7.6*  ALBUMIN 3.6  --   CALCIUM 7.2* 7.2*  CREATININE 6.34* 5.72*  K 5.6* 4.0   No results for input(s): "IRON", "TIBC", "FERRITIN" in the last 168 hours. Inpatient medications:  calcitRIOL  0.25 mcg Oral Daily   Chlorhexidine Gluconate Cloth  6 each Topical Daily   ferrous sulfate  325 mg Oral Daily   folic acid  1 mg Oral Daily   heparin  5,000 Units Subcutaneous Q8H   hydrALAZINE  100 mg Oral TID   isosorbide mononitrate  30 mg Oral Daily   metoprolol succinate  75 mg Oral Daily   pantoprazole  40 mg Oral Daily   sodium bicarbonate  1,300 mg Oral BID    sodium chloride 75 mL/hr at 12/28/22 1203   sodium bicarbonate 150 mEq in sterile water 1,150 mL infusion     acetaminophen **OR** acetaminophen, albuterol, hydrALAZINE, labetalol, ondansetron **OR** ondansetron (ZOFRAN) IV, mouth rinse, traZODone

## 2022-12-28 NOTE — Plan of Care (Signed)
  Problem: Education: Goal: Knowledge of General Education information will improve Description: Including pain rating scale, medication(s)/side effects and non-pharmacologic comfort measures Outcome: Progressing   Problem: Activity: Goal: Risk for activity intolerance will decrease Outcome: Progressing   Problem: Coping: Goal: Level of anxiety will decrease Outcome: Progressing   

## 2022-12-29 ENCOUNTER — Encounter: Payer: Medicaid Other | Admitting: Internal Medicine

## 2022-12-29 ENCOUNTER — Other Ambulatory Visit (HOSPITAL_COMMUNITY): Payer: Self-pay

## 2022-12-29 DIAGNOSIS — N185 Chronic kidney disease, stage 5: Secondary | ICD-10-CM | POA: Diagnosis not present

## 2022-12-29 DIAGNOSIS — N179 Acute kidney failure, unspecified: Secondary | ICD-10-CM | POA: Diagnosis not present

## 2022-12-29 LAB — CBC WITH DIFFERENTIAL/PLATELET
Abs Immature Granulocytes: 0.01 10*3/uL (ref 0.00–0.07)
Basophils Absolute: 0 10*3/uL (ref 0.0–0.1)
Basophils Relative: 0 %
Eosinophils Absolute: 0.1 10*3/uL (ref 0.0–0.5)
Eosinophils Relative: 2 %
HCT: 25.6 % — ABNORMAL LOW (ref 36.0–46.0)
Hemoglobin: 7.8 g/dL — ABNORMAL LOW (ref 12.0–15.0)
Immature Granulocytes: 0 %
Lymphocytes Relative: 29 %
Lymphs Abs: 1.5 10*3/uL (ref 0.7–4.0)
MCH: 24.8 pg — ABNORMAL LOW (ref 26.0–34.0)
MCHC: 30.5 g/dL (ref 30.0–36.0)
MCV: 81.5 fL (ref 80.0–100.0)
Monocytes Absolute: 0.5 10*3/uL (ref 0.1–1.0)
Monocytes Relative: 9 %
Neutro Abs: 3.2 10*3/uL (ref 1.7–7.7)
Neutrophils Relative %: 60 %
Platelets: 215 10*3/uL (ref 150–400)
RBC: 3.14 MIL/uL — ABNORMAL LOW (ref 3.87–5.11)
RDW: 16.5 % — ABNORMAL HIGH (ref 11.5–15.5)
WBC: 5.3 10*3/uL (ref 4.0–10.5)
nRBC: 0 % (ref 0.0–0.2)

## 2022-12-29 LAB — BASIC METABOLIC PANEL
Anion gap: 9 (ref 5–15)
BUN: 43 mg/dL — ABNORMAL HIGH (ref 6–20)
CO2: 18 mmol/L — ABNORMAL LOW (ref 22–32)
Calcium: 7.4 mg/dL — ABNORMAL LOW (ref 8.9–10.3)
Chloride: 109 mmol/L (ref 98–111)
Creatinine, Ser: 5.63 mg/dL — ABNORMAL HIGH (ref 0.44–1.00)
GFR, Estimated: 9 mL/min — ABNORMAL LOW (ref >60)
Glucose, Bld: 120 mg/dL — ABNORMAL HIGH (ref 70–99)
Potassium: 3.9 mmol/L (ref 3.5–5.1)
Sodium: 136 mmol/L (ref 135–145)

## 2022-12-29 MED ORDER — ISOSORBIDE MONONITRATE ER 60 MG PO TB24
60.0000 mg | ORAL_TABLET | Freq: Every day | ORAL | Status: DC
  Administered 2022-12-29: 60 mg via ORAL
  Filled 2022-12-29: qty 1

## 2022-12-29 MED ORDER — ISOSORBIDE MONONITRATE ER 60 MG PO TB24
60.0000 mg | ORAL_TABLET | Freq: Every day | ORAL | 0 refills | Status: DC
  Filled 2022-12-29: qty 30, 30d supply, fill #0

## 2022-12-29 MED ORDER — HYDRALAZINE HCL 100 MG PO TABS
100.0000 mg | ORAL_TABLET | Freq: Three times a day (TID) | ORAL | 0 refills | Status: DC
  Filled 2022-12-29: qty 90, 30d supply, fill #0

## 2022-12-29 NOTE — Progress Notes (Signed)
[name] Progress Note  Subjective: seen in room, creat stable in the 5- 6 range. Going home today.   Vitals:   12/29/22 0900 12/29/22 0902 12/29/22 1100 12/29/22 1115  BP: (!) 172/81 (!) 172/81  (!) 177/80  Pulse: 82 82 73   Resp: 12  (!) 8 12  Temp:      TempSrc:      SpO2: 100%  100% 100%  Weight:      Height:        Exam: Gen alert, no distress No rash, cyanosis or gangrene Sclera anicteric, throat clear  No jvd or bruits Chest clear bilat to bases, no rales/ wheezing RRR no MRG Abd soft ntnd no mass or ascites +bs Ext no LE or UE edema Neuro is alert, Ox 3 , nf        Renal-related home meds: - rocaltrol 0.25 mcg every day - hydralazine 50 tid - toprol xl 75 every day - sod bicarb 2 bid     Date               Creat               eGFR   2020              2.29- 3.03           2021              2.70- 3.18   2022              2.62- 5.46   2023              3.32- 3.63   July 2024       4.7- 5.0   Aug                4.6- 5.1            10- 13 ml/min     Sept               4.92- 5.16        10- 11 ml/min     Nov                6.09   12/27/22        6.34     UA glu to, prot > 300, 0-5 rbc/ wbc  From Aug 2024:    UP/C ratio 08/31/22 -- 2.6 gm / 24  hrs    Urine IF - no M protein present (aug 2024)     UPEP - no M protein     Serum FLC -  2.1, slightly high      Hep C - negative, HIV negative   Assessment/ Plan: AKI on CKD 5 - b/l creatinine 4.6- 5.1, eGFR 10- 13 ml/min.  Creat here was 6.4 on admission in the setting of dizziness and lightheadedness, uncont HTN. Pt has had progressive CKD likely due to diabetes and HTN. Has never seen a renal doctor. She got insurance (medicaid) several mos ago and has been referred to [facility] but missed 1 or 2 appts due to weather.  + proteinuria suggest DKD. Myeloma markers and hep C / HIV were negative in Aug 2024. Serologies drawn and results pending. Creatinine leveling off in 5- 6 range after IVF"s. She has an  appt tomorrow (thursday 12/12) w/ Dr Glenna Fellows at [facility]. Okay for discharge, have d/w pmd.  Anemia - Hb 7- 8 range, not real symptomatic HTNsive  urgency - BP's better on her home meds hydralazine and metoprolol xl.  Hyperkalemia - resolved w/ renal diet and lokelma    Rob Arlean Hopping MD  [facility] 12/29/2022, 12:55 PM  Recent Labs  Lab 12/27/22 1003 12/28/22 0259 12/29/22 0304  HGB 8.5* 7.6* 7.8*  ALBUMIN 3.6  --   --   CALCIUM 7.2* 7.2* 7.4*  CREATININE 6.34* 5.72* 5.63*  K 5.6* 4.0 3.9   No results for input(s): "IRON", "TIBC", "FERRITIN" in the last 168 hours. Inpatient medications:  calcitRIOL  0.25 mcg Oral Daily   Chlorhexidine Gluconate Cloth  6 each Topical Daily   ferrous sulfate  325 mg Oral Daily   folic acid  1 mg Oral Daily   heparin  5,000 Units Subcutaneous Q8H   hydrALAZINE  100 mg Oral TID   isosorbide mononitrate  60 mg Oral Daily   metoprolol succinate  75 mg Oral Daily   pantoprazole  40 mg Oral Daily   sodium bicarbonate  1,300 mg Oral BID    sodium bicarbonate 150 mEq in sterile water 1,150 mL infusion 75 mL/hr at 12/29/22 0741   acetaminophen **OR** acetaminophen, albuterol, hydrALAZINE, labetalol, ondansetron **OR** ondansetron (ZOFRAN) IV, mouth rinse, traZODone

## 2022-12-29 NOTE — Progress Notes (Signed)
   12/29/22 1016  TOC Brief Assessment  Insurance and Status Reviewed  Patient has primary care physician Yes  Home environment has been reviewed home with family  Prior level of function: independent  Prior/Current Home Services No current home services  Social Determinants of Health Reivew SDOH reviewed no interventions necessary  Readmission risk has been reviewed Yes  Transition of care needs no transition of care needs at this time

## 2022-12-29 NOTE — Discharge Summary (Signed)
Physician Discharge Summary  Nafisa Lemmer EPP:[id number] DOB: [birth year] DOA: 12/27/2022  PCP: Philomena Doheny, MD  Admit date: 12/27/2022 Discharge date: 12/29/2022 30 Day Unplanned Readmission Risk Score    Flowsheet Row ED to Hosp-Admission (Current) from [facility]  30 Day Unplanned Readmission Risk Score (%) 34.27 Filed at 12/29/2022 0801       This score is the patient's risk of an unplanned readmission within 30 days of being discharged (0 -100%). The score is based on dignosis, age, lab data, medications, orders, and past utilization.   Low:  0-14.9   Medium: 15-21.9   High: 22-29.9   Extreme: 30 and above          Admitted From: Home Disposition: Home  Recommendations for Outpatient Follow-up:  Follow up with PCP in 1-2 weeks Please obtain BMP/CBC in one week Follow-up with nephrology Dr. Glenna Fellows tomorrow 12/30/2022 Please follow up with your PCP on the following pending results: Unresulted Labs (From admission, onward)    None         Home Health: None Equipment/Devices: None  Discharge Condition: Stable CODE STATUS: Full code Diet recommendation: Low-sodium  Subjective: Seen and appear fully alert and oriented and has no complaints and she is eager to go home.  Brief/Interim Summary:  [name] is a 42 y.o. female with medical history significant for hypertension, CKD stage V presented to hospital for feeling weak and dizzy and was admitted to the hospital with dizziness, uncontrolled hypertension and acute on chronic renal failure.  Apparently she has had worsening CKD for quite some time, but has not followed up as directed with nephrology.  She takes metoprolol and hydralazine for blood pressure, but tells me that she feels unwell when she takes the medication every day, show she typically takes it every other day. She does not check her blood pressure at home.  She was admitted for the following.    Hypertensive urgency-initial blood pressure systolic as high as 238 and diastolic 125.  Noncompliant, takes medications every other day due to nausea. Resumed all home medications.  Blood pressure was still elevated.  We increased her hydralazine  from 50 to 100 mg 3 times daily, and then today we increased her Imdur to 60 mg as well.  Continued previous Toprol-XL at 75 mg p.o. daily.  I have made sure that I have sent new medications to [facility] pharmacy so she gets them delivered at the bedside before discharge.    Acute on chronic kidney disease stage V with metabolic acidosis-baseline creatinine 4.6-5.1.  Presented with creatinine of 6.4 with symptoms of dizziness and lightheadedness, creatinine has improved to 5.6 today.  Nephrology followed during this hospitalization, she received IV fluids and p.o bicarbonate.  She has been cleared for discharge.  She has been given an appointment to see Dr. Glenna Fellows with nephrology on 12/30/2022.  She is fully aware of the time and date of the appointment.   Hypocalcemia: Per nephrology.   Hyperkalemia-resolved with Lokelma.   GERD-Protonix   Anemia of chronic disease: Secondary to renal disease.  Her baseline hemoglobin appears to be between 7-9.  Currently 7.8.   Folate deficiency: Folic acid checked 2 months ago was 4.6.  Will resume folic acid supplement.  Discharge plan was discussed with patient and/or family member and they verbalized understanding and agreed with it.  Discharge Diagnoses:  Principal Problem:   Acute renal failure superimposed on stage 5 chronic kidney disease, not on chronic dialysis,  unspecified acute renal failure type [facility]) Active Problems:   Depression   Hypertensive urgency    Discharge Instructions   Allergies as of 12/29/2022       Reactions   Shellfish Allergy Anaphylaxis, Swelling   Morphine And Codeine Nausea And Vomiting        Medication List     TAKE these medications    acetaminophen 500 MG  tablet Commonly known as: TYLENOL Take 1,000 mg by mouth every 6 (six) hours as needed for moderate pain.   calcitRIOL 0.25 MCG capsule Commonly known as: ROCALTROL Take 1 capsule (0.25 mcg total) by mouth daily.   Calcium Antacid 500 MG chewable tablet Generic drug: calcium carbonate Chew 1 tablet (200 mg of elemental calcium total) by mouth 3 (three) times daily between meals.   FeroSul 325 (65 Fe) MG tablet Generic drug: ferrous sulfate Take 1 tablet (325 mg total) by mouth daily.   folic acid 1 MG tablet Commonly known as: FOLVITE Take 1 tablet (1 mg total) by mouth daily.   hydrALAZINE 100 MG tablet Commonly known as: APRESOLINE Take 1 tablet (100 mg total) by mouth 3 (three) times daily. What changed: how much to take   isosorbide mononitrate 60 MG 24 hr tablet Commonly known as: IMDUR Take 1 tablet (60 mg total) by mouth daily. Start taking on: December 30, 2022   metoprolol succinate 50 MG 24 hr tablet Commonly known as: TOPROL-XL Take 1.5 tablets (75 mg total) by mouth daily. Take with or immediately following a meal.   pantoprazole 40 MG tablet Commonly known as: PROTONIX Take 1 tablet (40 mg total) by mouth daily.   sodium bicarbonate 650 MG tablet Take 2 tablets (1,300 mg total) by mouth 2 (two) times daily.   Vitamin D (Ergocalciferol) 1.25 MG (50000 UNIT) Caps capsule Commonly known as: DRISDOL Take 1 capsule (50,000 Units total) by mouth every Monday.        Follow-up Information     Colbert Coyer, Priscila, MD Follow up in 1 week(s).   Specialty: Internal Medicine Contact information: [address] [place] [address] [phone]         Tyler Pita, MD Follow up on 12/30/2022.   Specialties: Internal Medicine, Vascular Surgery Contact information: [address] [place] [address] [phone]                Allergies  Allergen Reactions   Shellfish Allergy Anaphylaxis and Swelling   Morphine And Codeine Nausea And  Vomiting    Consultations: Nephrology   Procedures/Studies: US RENAL  Result Date: 12/27/2022 CLINICAL DATA:  Acute renal failure on stage 5 chronic kidney disease EXAM: RENAL / URINARY TRACT ULTRASOUND COMPLETE COMPARISON:  08/12/2022 FINDINGS: Right Kidney: Renal measurements: 8.7 x 3.6 x 5.3 cm = volume: 87 mL. 11 mm cyst off the upper pole of the right kidney. Diffuse atrophy. Increased echotexture. No suspicious mass or hydronephrosis. Left Kidney: Renal measurements: 8.4 x 4.1 x 5.4 cm = volume: 97 mL. Diffusely increased echotexture. No mass or hydronephrosis. Bladder: Appears normal for degree of bladder distention. Other: None. IMPRESSION: Atrophic kidneys bilaterally, right greater than left. Increased echotexture compatible with chronic medical renal disease. No acute findings. Electronically Signed   By: Charlett Nose M.D.   On: 12/27/2022 22:53     Discharge Exam: Vitals:   12/29/22 0900 12/29/22 0902  BP: (!) 172/81 (!) 172/81  Pulse: 82 82  Resp: 12   Temp:    SpO2: 100%  Vitals:   12/29/22 0741 12/29/22 0800 12/29/22 0900 12/29/22 0902  BP: (!) 189/82 (!) 172/73 (!) 172/81 (!) 172/81  Pulse: 70 72 82 82  Resp: 15 14 12    Temp: 98.1 F (36.7 C)     TempSrc: Oral     SpO2: 100% 100% 100%   Weight:      Height:        General: Pt is alert, awake, not in acute distress Cardiovascular: RRR, S1/S2 +, no rubs, no gallops Respiratory: CTA bilaterally, no wheezing, no rhonchi Abdominal: Soft, NT, ND, bowel sounds + Extremities: no edema, no cyanosis    The results of significant diagnostics from this hospitalization (including imaging, microbiology, ancillary and laboratory) are listed below for reference.     Microbiology: Recent Results (from the past 240 hour(s))  MRSA Next Gen by PCR, Nasal     Status: None   Collection Time: 12/27/22  4:08 PM   Specimen: Nasal Mucosa; Nasal Swab  Result Value Ref Range Status   MRSA by PCR Next Gen NOT DETECTED NOT  DETECTED Final    Comment: (NOTE) The GeneXpert MRSA Assay (FDA approved for NASAL specimens only), is one component of a comprehensive MRSA colonization surveillance program. It is not intended to diagnose MRSA infection nor to guide or monitor treatment for MRSA infections. Test performance is not FDA approved in patients less than 23 years old. Performed at [facility], [address], [place], [place] [address]      Labs: BNP (last 3 results) No results for input(s): "BNP" in the last 8760 hours. Basic Metabolic Panel: Recent Labs  Lab 12/27/22 1003 12/28/22 0259 12/29/22 0304  NA 137 138 136  K 5.6* 4.0 3.9  CL 112* 115* 109  CO2 17* 14* 18*  GLUCOSE 121* 95 120*  BUN 50* 46* 43*  CREATININE 6.34* 5.72* 5.63*  CALCIUM 7.2* 7.2* 7.4*   Liver Function Tests: Recent Labs  Lab 12/27/22 1003  AST 21  ALT 15  ALKPHOS 73  BILITOT 0.4  PROT 7.3  ALBUMIN 3.6   No results for input(s): "LIPASE", "AMYLASE" in the last 168 hours. No results for input(s): "AMMONIA" in the last 168 hours. CBC: Recent Labs  Lab 12/27/22 1003 12/28/22 0259 12/29/22 0304  WBC 5.1 5.2 5.3  NEUTROABS 2.8  --  3.2  HGB 8.5* 7.6* 7.8*  HCT 27.1* 25.1* 25.6*  MCV 80.7 82.6 81.5  PLT 212 196 215   Cardiac Enzymes: No results for input(s): "CKTOTAL", "CKMB", "CKMBINDEX", "TROPONINI" in the last 168 hours. BNP: Invalid input(s): "POCBNP" CBG: Recent Labs  Lab 12/27/22 0914  GLUCAP 108*   D-Dimer No results for input(s): "DDIMER" in the last 72 hours. Hgb A1c No results for input(s): "HGBA1C" in the last 72 hours. Lipid Profile No results for input(s): "CHOL", "HDL", "LDLCALC", "TRIG", "CHOLHDL", "LDLDIRECT" in the last 72 hours. Thyroid function studies No results for input(s): "TSH", "T4TOTAL", "T3FREE", "THYROIDAB" in the last 72 hours.  Invalid input(s): "FREET3" Anemia work up No results for input(s): "VITAMINB12", "FOLATE", "FERRITIN", "TIBC", "IRON",  "RETICCTPCT" in the last 72 hours. Urinalysis    Component Value Date/Time   COLORURINE STRAW (A) 12/27/2022 1033   APPEARANCEUR CLEAR 12/27/2022 1033   LABSPEC 1.013 12/27/2022 1033   PHURINE 6.0 12/27/2022 1033   GLUCOSEU 50 (A) 12/27/2022 1033   HGBUR NEGATIVE 12/27/2022 1033   BILIRUBINUR NEGATIVE 12/27/2022 1033   KETONESUR NEGATIVE 12/27/2022 1033   PROTEINUR >=300 (A) 12/27/2022 1033  UROBILINOGEN 1.0 08/11/2013 1128   NITRITE NEGATIVE 12/27/2022 1033   LEUKOCYTESUR NEGATIVE 12/27/2022 1033   Sepsis Labs Recent Labs  Lab 12/27/22 1003 12/28/22 0259 12/29/22 0304  WBC 5.1 5.2 5.3   Microbiology Recent Results (from the past 240 hour(s))  MRSA Next Gen by PCR, Nasal     Status: None   Collection Time: 12/27/22  4:08 PM   Specimen: Nasal Mucosa; Nasal Swab  Result Value Ref Range Status   MRSA by PCR Next Gen NOT DETECTED NOT DETECTED Final    Comment: (NOTE) The GeneXpert MRSA Assay (FDA approved for NASAL specimens only), is one component of a comprehensive MRSA colonization surveillance program. It is not intended to diagnose MRSA infection nor to guide or monitor treatment for MRSA infections. Test performance is not FDA approved in patients less than 18 years old. Performed at [facility], [address], [place], [place] [address]     FURTHER DISCHARGE INSTRUCTIONS:   Get Medicines reviewed and adjusted: Please take all your medications with you for your next visit with your Primary MD   Laboratory/radiological data: Please request your Primary MD to go over all hospital tests and procedure/radiological results at the follow up, please ask your Primary MD to get all Hospital records sent to his/her office.   In some cases, they will be blood work, cultures and biopsy results pending at the time of your discharge. Please request that your primary care M.D. goes through all the records of your hospital data and follows up on these  results.   Also Note the following: If you experience worsening of your admission symptoms, develop shortness of breath, life threatening emergency, suicidal or homicidal thoughts you must seek medical attention immediately by calling 911 or calling your MD immediately  if symptoms less severe.   You must read complete instructions/literature along with all the possible adverse reactions/side effects for all the Medicines you take and that have been prescribed to you. Take any new Medicines after you have completely understood and accpet all the possible adverse reactions/side effects.    Do not drive when taking Pain medications or sleeping medications (Benzodaizepines)   Do not take more than prescribed Pain, Sleep and Anxiety Medications. It is not advisable to combine anxiety,sleep and pain medications without talking with your primary care practitioner   Special Instructions: If you have smoked or chewed Tobacco  in the last 2 yrs please stop smoking, stop any regular Alcohol  and or any Recreational drug use.   Wear Seat belts while driving.   Please note: You were cared for by a hospitalist during your hospital stay. Once you are discharged, your primary care physician will handle any further medical issues. Please note that NO REFILLS for any discharge medications will be authorized once you are discharged, as it is imperative that you return to your primary care physician (or establish a relationship with a primary care physician if you do not have one) for your post hospital discharge needs so that they can reassess your need for medications and monitor your lab values  Time coordinating discharge: Over 30 minutes  SIGNED:   Hughie Closs, MD  [facility] 12/29/2022, 9:26 AM *Please note that this is a verbal dictation therefore any spelling or grammatical errors are due to the "Dragon [employer] One" system interpretation. If 7PM-7AM, please contact  night-coverage [email or web address]

## 2022-12-30 ENCOUNTER — Telehealth: Payer: Self-pay

## 2022-12-30 NOTE — Transitions of Care (Post Inpatient/ED Visit) (Signed)
12/30/2022  Name: [name] MRN: [id number] DOB: Mar 19, 1980  Today's TOC FU Call Status: Today's TOC FU Call Status:: Successful TOC FU Call Completed TOC FU Call Complete Date: 12/30/22 Patient's Name and Date of Birth confirmed.  Transition Care Management Follow-up Telephone Call Date of Discharge: 12/29/22 Discharge Facility: [facility]) Type of Discharge: Inpatient Admission Primary Inpatient Discharge Diagnosis:: CKD How have you been since you were released from the hospital?: Better  Items Reviewed: Did you receive and understand the discharge instructions provided?: Yes Medications obtained,verified, and reconciled?: Yes (Medications Reviewed) Any new allergies since your discharge?: No Dietary orders reviewed?: Yes Do you have support at home?: Yes People in Home: significant other  Medications Reviewed Today: Medications Reviewed Today     Reviewed by Karena Addison, LPN (Licensed Practical Nurse) on 12/30/22 at [phone]  Med List Status: <None>   Medication Order Taking? Sig Documenting Provider Last Dose Status Informant  acetaminophen (TYLENOL) 500 MG tablet [id number] No Take 1,000 mg by mouth every 6 (six) hours as needed for moderate pain. [provider] Past Month Active Self, Pharmacy Records  calcitRIOL (ROCALTROL) 0.25 MCG capsule [id number] No Take 1 capsule (0.25 mcg total) by mouth daily. Carmina Miller, DO 12/26/2022 Active Self, Pharmacy Records  calcium carbonate (TUMS - DOSED IN MG ELEMENTAL CALCIUM) 500 MG chewable tablet [id number] No Chew 1 tablet (200 mg of elemental calcium total) by mouth 3 (three) times daily between meals. Carmina Miller, DO 12/26/2022 Active Self, Pharmacy Records  ferrous sulfate 325 (65 FE) MG tablet [id number] No Take 1 tablet (325 mg total) by mouth daily. Carmina Miller, DO 12/26/2022 Active Self, Pharmacy Records  folic acid (FOLVITE) 1 MG tablet [id number] No Take 1 tablet (1 mg total) by mouth daily. Carmina Miller,  DO 12/26/2022 Active Self, Pharmacy Records  hydrALAZINE (APRESOLINE) 100 MG tablet [id number]  Take 1 tablet (100 mg total) by mouth 3 (three) times daily. Hughie Closs, MD  Active   isosorbide mononitrate (IMDUR) 60 MG 24 hr tablet [id number]  Take 1 tablet (60 mg total) by mouth daily. Hughie Closs, MD  Active   metoprolol succinate (TOPROL-XL) 50 MG 24 hr tablet [id number] No Take 1.5 tablets (75 mg total) by mouth daily. Take with or immediately following a meal. Philomena Doheny, MD 12/26/2022 Active Self, Pharmacy Records           Med Note Sedonia Small Dec 27, 2022 10:49 AM) Confirms 1 and 1/2 tablet.   pantoprazole (PROTONIX) 40 MG tablet [id number] No Take 1 tablet (40 mg total) by mouth daily. Carmina Miller, DO 12/26/2022 Active Self, Pharmacy Records  sodium bicarbonate 650 MG tablet [id number] No Take 2 tablets (1,300 mg total) by mouth 2 (two) times daily. Carmina Miller, DO 12/26/2022 Active Self, Pharmacy Records  Vitamin D, Ergocalciferol, (DRISDOL) 1.25 MG (50000 UNIT) CAPS capsule [id number] No Take 1 capsule (50,000 Units total) by mouth every Monday. Philomena Doheny, MD Past Week Active Self, Pharmacy Records            Home Care and Equipment/Supplies: Were Home Health Services Ordered?: NA Any new equipment or medical supplies ordered?: NA  Functional Questionnaire: Do you need assistance with bathing/showering or dressing?: No Do you need assistance with meal preparation?: No Do you need assistance with eating?: No Do you have difficulty maintaining continence: No Do you need assistance with getting out of bed/getting out of a chair/moving?: No Do you have difficulty managing or taking your medications?:  No  Follow up appointments reviewed: PCP Follow-up appointment confirmed?: No (no avail appt, sent message to staff to schedule) MD Provider Line Number:[phone] Given: No Specialist Hospital Follow-up appointment confirmed?: Yes Date  of Specialist follow-up appointment?: 12/30/22 Follow-Up Specialty Provider:: Nephro Do you need transportation to your follow-up appointment?: No Do you understand care options if your condition(s) worsen?: Yes-patient verbalized understanding    SIGNATURE Karena Addison, LPN [facility] Nurse Health Advisor Direct Dial [phone]

## 2023-01-21 ENCOUNTER — Emergency Department (HOSPITAL_COMMUNITY)
Admission: EM | Admit: 2023-01-21 | Discharge: 2023-01-21 | Disposition: A | Discharge status: Home / Self Care | Payer: Medicaid Other | Attending: Emergency Medicine | Admitting: Emergency Medicine

## 2023-01-21 ENCOUNTER — Encounter (HOSPITAL_COMMUNITY): Payer: Self-pay | Admitting: Emergency Medicine

## 2023-01-21 ENCOUNTER — Emergency Department (HOSPITAL_COMMUNITY)
Admission: EM | Admit: 2023-01-21 | Discharge: 2023-01-22 | Disposition: A | Discharge status: Home / Self Care | Payer: Medicaid Other | Attending: Emergency Medicine | Admitting: Emergency Medicine

## 2023-01-21 ENCOUNTER — Emergency Department (HOSPITAL_COMMUNITY): Payer: Medicaid Other

## 2023-01-21 DIAGNOSIS — R000 Tachycardia, unspecified: Secondary | ICD-10-CM | POA: Diagnosis not present

## 2023-01-21 DIAGNOSIS — N185 Chronic kidney disease, stage 5: Secondary | ICD-10-CM | POA: Insufficient documentation

## 2023-01-21 DIAGNOSIS — R112 Nausea with vomiting, unspecified: Secondary | ICD-10-CM | POA: Diagnosis not present

## 2023-01-21 DIAGNOSIS — E1122 Type 2 diabetes mellitus with diabetic chronic kidney disease: Secondary | ICD-10-CM | POA: Insufficient documentation

## 2023-01-21 DIAGNOSIS — E1022 Type 1 diabetes mellitus with diabetic chronic kidney disease: Secondary | ICD-10-CM | POA: Diagnosis not present

## 2023-01-21 DIAGNOSIS — R1032 Left lower quadrant pain: Secondary | ICD-10-CM | POA: Diagnosis present

## 2023-01-21 DIAGNOSIS — J45909 Unspecified asthma, uncomplicated: Secondary | ICD-10-CM | POA: Insufficient documentation

## 2023-01-21 DIAGNOSIS — N3289 Other specified disorders of bladder: Secondary | ICD-10-CM | POA: Diagnosis not present

## 2023-01-21 DIAGNOSIS — R1084 Generalized abdominal pain: Secondary | ICD-10-CM | POA: Insufficient documentation

## 2023-01-21 DIAGNOSIS — R109 Unspecified abdominal pain: Secondary | ICD-10-CM | POA: Diagnosis not present

## 2023-01-21 DIAGNOSIS — K449 Diaphragmatic hernia without obstruction or gangrene: Secondary | ICD-10-CM | POA: Diagnosis not present

## 2023-01-21 LAB — CBC
HCT: 27.2 % — ABNORMAL LOW (ref 36.0–46.0)
Hemoglobin: 8.5 g/dL — ABNORMAL LOW (ref 12.0–15.0)
MCH: 25.1 pg — ABNORMAL LOW (ref 26.0–34.0)
MCHC: 31.3 g/dL (ref 30.0–36.0)
MCV: 80.2 fL (ref 80.0–100.0)
Platelets: 237 10*3/uL (ref 150–400)
RBC: 3.39 MIL/uL — ABNORMAL LOW (ref 3.87–5.11)
RDW: 16.7 % — ABNORMAL HIGH (ref 11.5–15.5)
WBC: 7.6 10*3/uL (ref 4.0–10.5)
nRBC: 0 % (ref 0.0–0.2)

## 2023-01-21 LAB — CBC WITH DIFFERENTIAL/PLATELET
Abs Immature Granulocytes: 0.02 10*3/uL (ref 0.00–0.07)
Basophils Absolute: 0 10*3/uL (ref 0.0–0.1)
Basophils Relative: 0 %
Eosinophils Absolute: 0 10*3/uL (ref 0.0–0.5)
Eosinophils Relative: 0 %
HCT: 27.2 % — ABNORMAL LOW (ref 36.0–46.0)
Hemoglobin: 8.3 g/dL — ABNORMAL LOW (ref 12.0–15.0)
Immature Granulocytes: 0 %
Lymphocytes Relative: 13 %
Lymphs Abs: 1 10*3/uL (ref 0.7–4.0)
MCH: 24.6 pg — ABNORMAL LOW (ref 26.0–34.0)
MCHC: 30.5 g/dL (ref 30.0–36.0)
MCV: 80.7 fL (ref 80.0–100.0)
Monocytes Absolute: 0.3 10*3/uL (ref 0.1–1.0)
Monocytes Relative: 4 %
Neutro Abs: 6.6 10*3/uL (ref 1.7–7.7)
Neutrophils Relative %: 83 %
Platelets: 243 10*3/uL (ref 150–400)
RBC: 3.37 MIL/uL — ABNORMAL LOW (ref 3.87–5.11)
RDW: 16.8 % — ABNORMAL HIGH (ref 11.5–15.5)
WBC: 8 10*3/uL (ref 4.0–10.5)
nRBC: 0 % (ref 0.0–0.2)

## 2023-01-21 LAB — COMPREHENSIVE METABOLIC PANEL
ALT: 11 U/L (ref 0–44)
ALT: 11 U/L (ref 0–44)
AST: 15 U/L (ref 15–41)
AST: 14 U/L — ABNORMAL LOW (ref 15–41)
Albumin: 3.8 g/dL (ref 3.5–5.0)
Albumin: 3.8 g/dL (ref 3.5–5.0)
Alkaline Phosphatase: 72 U/L (ref 38–126)
Alkaline Phosphatase: 72 U/L (ref 38–126)
Anion gap: 11 (ref 5–15)
Anion gap: 13 (ref 5–15)
BUN: 48 mg/dL — ABNORMAL HIGH (ref 6–20)
BUN: 49 mg/dL — ABNORMAL HIGH (ref 6–20)
CO2: 14 mmol/L — ABNORMAL LOW (ref 22–32)
CO2: 12 mmol/L — ABNORMAL LOW (ref 22–32)
Calcium: 7.9 mg/dL — ABNORMAL LOW (ref 8.9–10.3)
Calcium: 8 mg/dL — ABNORMAL LOW (ref 8.9–10.3)
Chloride: 110 mmol/L (ref 98–111)
Chloride: 110 mmol/L (ref 98–111)
Creatinine, Ser: 5.35 mg/dL — ABNORMAL HIGH (ref 0.44–1.00)
Creatinine, Ser: 5.59 mg/dL — ABNORMAL HIGH (ref 0.44–1.00)
GFR, Estimated: 10 mL/min — ABNORMAL LOW (ref >60)
GFR, Estimated: 9 mL/min — ABNORMAL LOW (ref >60)
Glucose, Bld: 124 mg/dL — ABNORMAL HIGH (ref 70–99)
Glucose, Bld: 159 mg/dL — ABNORMAL HIGH (ref 70–99)
Potassium: 5.1 mmol/L (ref 3.5–5.1)
Potassium: 4.5 mmol/L (ref 3.5–5.1)
Sodium: 135 mmol/L (ref 135–145)
Sodium: 135 mmol/L (ref 135–145)
Total Bilirubin: 0.6 mg/dL (ref 0.0–1.2)
Total Bilirubin: 0.9 mg/dL (ref 0.0–1.2)
Total Protein: 7.8 g/dL (ref 6.5–8.1)
Total Protein: 7.8 g/dL (ref 6.5–8.1)

## 2023-01-21 LAB — URINALYSIS, ROUTINE W REFLEX MICROSCOPIC
Bacteria, UA: NONE SEEN
Bilirubin Urine: NEGATIVE
Glucose, UA: 50 mg/dL — AB
Hgb urine dipstick: NEGATIVE
Ketones, ur: 5 mg/dL — AB
Leukocytes,Ua: NEGATIVE
Nitrite: NEGATIVE
Protein, ur: 100 mg/dL — AB
Specific Gravity, Urine: 1.013 (ref 1.005–1.030)
pH: 6 (ref 5.0–8.0)

## 2023-01-21 LAB — LIPASE, BLOOD
Lipase: 45 U/L (ref 11–51)
Lipase: 62 U/L — ABNORMAL HIGH (ref 11–51)

## 2023-01-21 MED ORDER — MORPHINE SULFATE (PF) 4 MG/ML IV SOLN: 4.0000 mg | Freq: Once | INTRAVENOUS | Status: DC

## 2023-01-21 MED ORDER — ONDANSETRON 4 MG PO TBDP: 4.0000 mg | ORAL_TABLET | Freq: Four times a day (QID) | ORAL | 0 refills | Status: DC | PRN

## 2023-01-21 MED ORDER — DICYCLOMINE HCL 20 MG PO TABS
20.0000 mg | ORAL_TABLET | Freq: Two times a day (BID) | ORAL | 0 refills | Status: DC
Start: 2023-01-21 — End: 2023-11-13

## 2023-01-21 MED ORDER — FENTANYL CITRATE PF 50 MCG/ML IJ SOSY
50.0000 ug | PREFILLED_SYRINGE | Freq: Once | INTRAMUSCULAR | Status: AC
  Administered 2023-01-21: 50 ug via INTRAVENOUS
  Filled 2023-01-21: qty 1

## 2023-01-21 MED ORDER — LORAZEPAM 2 MG/ML IJ SOLN
1.0000 mg | Freq: Once | INTRAMUSCULAR | Status: AC
  Administered 2023-01-21: 1 mg via INTRAVENOUS
  Filled 2023-01-21: qty 1

## 2023-01-21 MED ORDER — ONDANSETRON HCL 4 MG/2ML IJ SOLN
4.0000 mg | Freq: Once | INTRAMUSCULAR | Status: AC
  Administered 2023-01-21: 4 mg via INTRAVENOUS
  Filled 2023-01-21: qty 2

## 2023-01-21 NOTE — Discharge Instructions (Signed)
 Your workup today was reassuring, you can take the medication I prescribed for your abdominal pain as well as nausea medication as needed.  You can follow-up with the GI doctors contact which I provided as needed.  Please return to the emergency department you have significant worsening pain despite treatment.

## 2023-01-21 NOTE — ED Triage Notes (Signed)
 Pt endorses n/v and left sided pain since last night. Last BM 3 days ago. Denies sick contacts.

## 2023-01-21 NOTE — ED Triage Notes (Signed)
 Pt here earlier and seen. Reports pain still there and vomiting. Smells strongly of marijuana.

## 2023-01-21 NOTE — ED Provider Notes (Signed)
 [place] EMERGENCY DEPARTMENT AT [facility] Provider Note   CSN: [id number] Arrival date & time: 01/21/23  9251     History  Chief Complaint  Patient presents with   Abdominal Pain   Emesis    [name] is a 43 y.o. female with past medical history significant for depression, diabetes, asthma, CKD stage V without dialysis at this time, anxiety, who presents concern for nausea, vomiting, left-sided abdominal pain, constipation for around 3 days.  She reports the abdominal pain was worse last night and she began to have a few episodes of emesis.  She denies any blood in her stool, she denies any dysuria, she denies any chest pain, shortness of breath.  She denies any fever, chills.   Abdominal Pain Associated symptoms: vomiting   Emesis Associated symptoms: abdominal pain        Home Medications Prior to Admission medications   Medication Sig Start Date End Date Taking? Authorizing Provider  dicyclomine  (BENTYL ) 20 MG tablet Take 1 tablet (20 mg total) by mouth 2 (two) times daily. 01/21/23  Yes Sewell Pitner H, PA-C  ondansetron  (ZOFRAN -ODT) 4 MG disintegrating tablet Take 1 tablet (4 mg total) by mouth every 6 (six) hours as needed for nausea or vomiting. 01/21/23  Yes Demetruis Depaul H, PA-C  acetaminophen  (TYLENOL ) 500 MG tablet Take 1,000 mg by mouth every 6 (six) hours as needed for moderate pain.    [provider]  calcitRIOL  (ROCALTROL ) 0.25 MCG capsule Take 1 capsule (0.25 mcg total) by mouth daily. 10/02/22   Marylu Gee, DO  calcium  carbonate (TUMS - DOSED IN MG ELEMENTAL CALCIUM ) 500 MG chewable tablet Chew 1 tablet (200 mg of elemental calcium  total) by mouth 3 (three) times daily between meals. 10/01/22   Marylu Gee, DO  ferrous sulfate  325 (65 FE) MG tablet Take 1 tablet (325 mg total) by mouth daily. 10/02/22   Marylu Gee, DO  folic acid  (FOLVITE ) 1 MG tablet Take 1 tablet (1 mg total) by mouth daily. 10/02/22   Marylu Gee, DO   hydrALAZINE  (APRESOLINE ) 100 MG tablet Take 1 tablet (100 mg total) by mouth 3 (three) times daily. 12/29/22 01/28/23  Vernon Ranks, MD  isosorbide  mononitrate (IMDUR ) 60 MG 24 hr tablet Take 1 tablet (60 mg total) by mouth daily. 12/30/22 01/29/23  Vernon Ranks, MD  metoprolol  succinate (TOPROL -XL) 50 MG 24 hr tablet Take 1.5 tablets (75 mg total) by mouth daily. Take with or immediately following a meal. 11/29/22   Arellano Zameza, Priscila, MD  pantoprazole  (PROTONIX ) 40 MG tablet Take 1 tablet (40 mg total) by mouth daily. 10/01/22   Marylu Gee, DO  sodium bicarbonate  650 MG tablet Take 2 tablets (1,300 mg total) by mouth 2 (two) times daily. 10/01/22 10/01/23  Marylu Gee, DO  Vitamin D , Ergocalciferol , (DRISDOL ) 1.25 MG (50000 UNIT) CAPS capsule Take 1 capsule (50,000 Units total) by mouth every Monday. 12/06/22   Arellano Zameza, Priscila, MD      Allergies    Shellfish allergy and Morphine  and codeine    Review of Systems   Review of Systems  Gastrointestinal:  Positive for abdominal pain and vomiting.  All other systems reviewed and are negative.   Physical Exam Updated Vital Signs BP (!) 148/95   Pulse (!) 105   Temp 98.2 F (36.8 C) (Oral)   Resp 20   Ht 5' 7 (1.702 m)   Wt 79.4 kg   SpO2 100%   BMI 27.43 kg/m  Physical Exam  Vitals and nursing note reviewed.  Constitutional:      General: She is not in acute distress.    Appearance: Normal appearance.  HENT:     Head: Normocephalic and atraumatic.  Eyes:     General:        Right eye: No discharge.        Left eye: No discharge.  Cardiovascular:     Rate and Rhythm: Normal rate and regular rhythm.     Heart sounds: No murmur heard.    No friction rub. No gallop.  Pulmonary:     Effort: Pulmonary effort is normal.     Breath sounds: Normal breath sounds.  Abdominal:     General: Bowel sounds are normal.     Palpations: Abdomen is soft.     Comments: Mild tenderness to palpation through left lower  quadrant, left flank, no rebound, rigidity, guarding  Skin:    General: Skin is warm and dry.     Capillary Refill: Capillary refill takes less than 2 seconds.  Neurological:     Mental Status: She is alert and oriented to person, place, and time.  Psychiatric:        Mood and Affect: Mood normal.        Behavior: Behavior normal.     Comments: Patient anxious, tearful     ED Results / Procedures / Treatments   Labs (all labs ordered are listed, but only abnormal results are displayed) Labs Reviewed  CBC - Abnormal; Notable for the following components:      Result Value   RBC 3.39 (*)    Hemoglobin 8.5 (*)    HCT 27.2 (*)    MCH 25.1 (*)    RDW 16.7 (*)    All other components within normal limits  COMPREHENSIVE METABOLIC PANEL - Abnormal; Notable for the following components:   CO2 14 (*)    Glucose, Bld 124 (*)    BUN 48 (*)    Creatinine, Ser 5.35 (*)    Calcium  7.9 (*)    GFR, Estimated 10 (*)    All other components within normal limits  URINALYSIS, ROUTINE W REFLEX MICROSCOPIC - Abnormal; Notable for the following components:   Glucose, UA 50 (*)    Ketones, ur 5 (*)    Protein, ur 100 (*)    All other components within normal limits  LIPASE, BLOOD    EKG None  Radiology CT ABDOMEN PELVIS WO CONTRAST Result Date: 01/21/2023 CLINICAL DATA:  Abdominal pain, acute, nonlocalized. Nausea/vomiting and left-sided pain since last night. EXAM: CT ABDOMEN AND PELVIS WITHOUT CONTRAST TECHNIQUE: Multidetector CT imaging of the abdomen and pelvis was performed following the standard protocol without IV contrast. RADIATION DOSE REDUCTION: This exam was performed according to the departmental dose-optimization program which includes automated exposure control, adjustment of the mA and/or kV according to patient size and/or use of iterative reconstruction technique. COMPARISON:  CT scan abdomen and pelvis from 09/23/2020. FINDINGS: Lower chest: The lung bases are clear. No pleural  effusion. The heart is normal in size. No pericardial effusion. Hepatobiliary: The liver is normal in size. Non-cirrhotic configuration. No suspicious mass. No intrahepatic or extrahepatic bile duct dilation. No calcified gallstones. Normal gallbladder wall thickness. No pericholecystic inflammatory changes. Pancreas: Unremarkable. No pancreatic ductal dilatation or surrounding inflammatory changes. Spleen: Within normal limits. No focal lesion. Adrenals/Urinary Tract: Adrenal glands are unremarkable. No suspicious renal mass. No hydronephrosis. No renal or ureteric calculi. Urinary bladder is under distended, precluding optimal  assessment. However, no large mass or stones identified. No perivesical fat stranding. Stomach/Bowel: There is a small sliding hiatal hernia. No disproportionate dilation of the small or large bowel loops. No evidence of abnormal bowel wall thickening or inflammatory changes. The appendix is unremarkable. There is small-to-moderate stool burden throughout the colon. Vascular/Lymphatic: No ascites or pneumoperitoneum. No abdominal or pelvic lymphadenopathy, by size criteria. No aneurysmal dilation of the major abdominal arteries. Reproductive: The uterus is unremarkable. No large adnexal mass. Other: The visualized soft tissues and abdominal wall are unremarkable. Musculoskeletal: No suspicious osseous lesions. IMPRESSION: 1. No acute inflammatory process identified within the abdomen or pelvis. 2. No bowel obstruction. 3. Multiple other nonacute observations, as described above. Electronically Signed   By: Ree Molt M.D.   On: 01/21/2023 09:59    Procedures Procedures    Medications Ordered in ED Medications  ondansetron  (ZOFRAN ) injection 4 mg (4 mg Intravenous Given 01/21/23 0910)  LORazepam  (ATIVAN ) injection 1 mg (1 mg Intravenous Given 01/21/23 0910)  fentaNYL  (SUBLIMAZE ) injection 50 mcg (50 mcg Intravenous Given 01/21/23 9089)    ED Course/ Medical Decision Making/ A&P                                  Medical Decision Making Amount and/or Complexity of Data Reviewed Labs: ordered. Radiology: ordered.  Risk Prescription drug management.   This patient is a 43 y.o. female  who presents to the ED for concern of nausea, vomiting, abdominal pain since last night.   Differential diagnoses prior to evaluation: The emergent differential diagnosis includes, but is not limited to,  The causes of generalized abdominal pain include but are not limited to AAA, mesenteric ischemia, appendicitis, diverticulitis, DKA, gastritis, gastroenteritis, AMI, nephrolithiasis, pancreatitis, peritonitis, adrenal insufficiency,lead poisoning, iron  toxicity, intestinal ischemia, constipation, UTI,SBO/LBO, splenic rupture, biliary disease, IBD, IBS, PUD, or hepatitis . This is not an exhaustive differential.   Past Medical History / Co-morbidities / Social History: CKD, hypertension, depression, asthma  Additional history: Chart reviewed. Pertinent results include: Reviewed lab work, imaging from recent previous emergency department visits, hospitalizations  Physical Exam: Physical exam performed. The pertinent findings include: Some focal tenderness in the left lower quadrant with no rebound, rigidity, guarding.  No abdominal distention, normal bowel sounds throughout.  Lab Tests/Imaging studies: I personally interpreted labs/imaging and the pertinent results include: CBC with chronic anemia, hemoglobin 8.5, not significantly changed from baseline.  Additionally with chronically elevated BUN, creatinine for her CKD stage V.  She does not have any significant electrolyte abnormalities suggesting need for emergent dialysis today, normal potassium, sodium.  Lipase normal.  UA with some glucose and small ketones but no evidence of acute infection..  I dependently interpreted CT abdomen pelvis without contrast which shows no evidence of acute intra-abdominal abnormality to explain  patient's symptoms.  I agree with the radiologist interpretation.   Medications: I ordered medication including fentanyl , Ativan , Zofran  for pain, nausea, anxiety.  I have reviewed the patients home medicines and have made adjustments as needed.   Disposition: After consideration of the diagnostic results and the patients response to treatment, I feel that patient feeling better on reevaluation, workup is reassuring, will discharge with Bentyl , Zofran , encouraged GI follow-up as needed.  Patient understands and agrees to plan..   emergency department workup does not suggest an emergent condition requiring admission or immediate intervention beyond what has been performed at this time. The plan is: as above.  The patient is safe for discharge and has been instructed to return immediately for worsening symptoms, change in symptoms or any other concerns.  Final Clinical Impression(s) / ED Diagnoses Final diagnoses:  Generalized abdominal pain    Rx / DC Orders ED Discharge Orders          Ordered    dicyclomine  (BENTYL ) 20 MG tablet  2 times daily        01/21/23 1213    ondansetron  (ZOFRAN -ODT) 4 MG disintegrating tablet  Every 6 hours PRN        01/21/23 1214              Kamaree Wheatley, [place] H, PA-C 01/21/23 1218    Doretha Folks, MD 01/21/23 1455

## 2023-01-22 LAB — RAPID URINE DRUG SCREEN, HOSP PERFORMED
Amphetamines: NOT DETECTED
Barbiturates: NOT DETECTED
Benzodiazepines: NOT DETECTED
Cocaine: NOT DETECTED
Opiates: NOT DETECTED
Tetrahydrocannabinol: POSITIVE — AB

## 2023-01-22 LAB — URINALYSIS, ROUTINE W REFLEX MICROSCOPIC
Bacteria, UA: NONE SEEN
Bilirubin Urine: NEGATIVE
Glucose, UA: 150 mg/dL — AB
Ketones, ur: 5 mg/dL — AB
Leukocytes,Ua: NEGATIVE
Nitrite: NEGATIVE
Protein, ur: >=300 mg/dL — AB
Specific Gravity, Urine: 1.01 (ref 1.005–1.030)
pH: 7 (ref 5.0–8.0)

## 2023-01-22 MED ORDER — LORAZEPAM 2 MG/ML IJ SOLN
0.5000 mg | Freq: Once | INTRAMUSCULAR | Status: AC
  Administered 2023-01-22: 0.5 mg via INTRAVENOUS
  Filled 2023-01-22: qty 1

## 2023-01-22 MED ORDER — DROPERIDOL 2.5 MG/ML IJ SOLN
2.5000 mg | Freq: Once | INTRAMUSCULAR | Status: AC
  Administered 2023-01-22: 2.5 mg via INTRAVENOUS
  Filled 2023-01-22: qty 2

## 2023-01-22 MED ORDER — METOCLOPRAMIDE HCL 5 MG/ML IJ SOLN
10.0000 mg | Freq: Once | INTRAMUSCULAR | Status: AC
  Administered 2023-01-22: 10 mg via INTRAVENOUS
  Filled 2023-01-22: qty 2

## 2023-01-22 MED ORDER — FAMOTIDINE 20 MG PO TABS
20.0000 mg | ORAL_TABLET | Freq: Once | ORAL | Status: AC
  Administered 2023-01-22: 20 mg via ORAL
  Filled 2023-01-22: qty 1

## 2023-01-22 NOTE — ED Notes (Signed)
 Pt tolerated PO challenge without any n/v.

## 2023-01-22 NOTE — Discharge Instructions (Signed)
 Please take the medicines that were prescribed to you earlier today.  If you develop any life-threatening symptoms or become unable to tolerate any oral intake including fluids with medication please return to the emergency department.

## 2023-01-22 NOTE — ED Provider Notes (Signed)
 [place] EMERGENCY DEPARTMENT AT [facility] Provider Note   CSN: [id number] Arrival date & time: 01/21/23  2122     History  Chief Complaint  Patient presents with   Abdominal Pain   Emesis    [name] is a 43 y.o. female.  Patient with past medical history significant for depression, diabetes, asthma, CKD stage V without dialysis, anxiety presents emergency room with continued concerns over nausea, vomiting, and left-sided abdominal pain.  She was seen Friday morning for the same with a grossly unremarkable workup.  She was treated with Zofran , Ativan , fentanyl  with relief of symptoms.  She was discharged with prescriptions for Bentyl  and Zofran .  Patient states that since going home she continues to have uncontrollable vomiting.  She does endorse smoking marijuana at home to try to improve her symptoms.  She reports smoking marijuana approximately once weekly.  She denies alcohol use.  She denies other new symptoms at this time.   Abdominal Pain Associated symptoms: vomiting   Emesis Associated symptoms: abdominal pain        Home Medications Prior to Admission medications   Medication Sig Start Date End Date Taking? Authorizing Provider  acetaminophen  (TYLENOL ) 500 MG tablet Take 1,000 mg by mouth every 6 (six) hours as needed for moderate pain.    [provider]  calcitRIOL  (ROCALTROL ) 0.25 MCG capsule Take 1 capsule (0.25 mcg total) by mouth daily. 10/02/22   Marylu Gee, DO  calcium  carbonate (TUMS - DOSED IN MG ELEMENTAL CALCIUM ) 500 MG chewable tablet Chew 1 tablet (200 mg of elemental calcium  total) by mouth 3 (three) times daily between meals. 10/01/22   Marylu Gee, DO  dicyclomine  (BENTYL ) 20 MG tablet Take 1 tablet (20 mg total) by mouth 2 (two) times daily. 01/21/23   Prosperi, Christian H, PA-C  ferrous sulfate  325 (65 FE) MG tablet Take 1 tablet (325 mg total) by mouth daily. 10/02/22   Marylu Gee, DO  folic acid  (FOLVITE ) 1 MG tablet  Take 1 tablet (1 mg total) by mouth daily. 10/02/22   Marylu Gee, DO  hydrALAZINE  (APRESOLINE ) 100 MG tablet Take 1 tablet (100 mg total) by mouth 3 (three) times daily. 12/29/22 01/28/23  Vernon Ranks, MD  isosorbide  mononitrate (IMDUR ) 60 MG 24 hr tablet Take 1 tablet (60 mg total) by mouth daily. 12/30/22 01/29/23  Vernon Ranks, MD  metoprolol  succinate (TOPROL -XL) 50 MG 24 hr tablet Take 1.5 tablets (75 mg total) by mouth daily. Take with or immediately following a meal. 11/29/22   Arellano Zameza, Priscila, MD  ondansetron  (ZOFRAN -ODT) 4 MG disintegrating tablet Take 1 tablet (4 mg total) by mouth every 6 (six) hours as needed for nausea or vomiting. 01/21/23   Prosperi, Christian H, PA-C  pantoprazole  (PROTONIX ) 40 MG tablet Take 1 tablet (40 mg total) by mouth daily. 10/01/22   Marylu Gee, DO  sodium bicarbonate  650 MG tablet Take 2 tablets (1,300 mg total) by mouth 2 (two) times daily. 10/01/22 10/01/23  Marylu Gee, DO  Vitamin D , Ergocalciferol , (DRISDOL ) 1.25 MG (50000 UNIT) CAPS capsule Take 1 capsule (50,000 Units total) by mouth every Monday. 12/06/22   Arellano Zameza, Priscila, MD      Allergies    Shellfish allergy and Morphine  and codeine    Review of Systems   Review of Systems  Gastrointestinal:  Positive for abdominal pain and vomiting.    Physical Exam Updated Vital Signs BP (!) 210/97   Pulse (!) 123   Temp 98.2 F (36.8  C) (Oral)   Resp 18   Ht 5' 7 (1.702 m)   Wt 79.8 kg   LMP 12/05/2022   SpO2 100%   BMI 27.57 kg/m  Physical Exam Vitals and nursing note reviewed.  Constitutional:      Appearance: Normal appearance.     Comments: Patient actively vomiting  HENT:     Head: Normocephalic and atraumatic.  Eyes:     General:        Right eye: No discharge.        Left eye: No discharge.  Cardiovascular:     Rate and Rhythm: Normal rate and regular rhythm.     Heart sounds: No murmur heard. Pulmonary:     Effort: Pulmonary effort is normal.      Breath sounds: Normal breath sounds.  Abdominal:     General: Bowel sounds are normal.     Palpations: Abdomen is soft.     Comments: Mild tenderness to palpation through left lower quadrant, left flank  Skin:    General: Skin is warm and dry.     Capillary Refill: Capillary refill takes less than 2 seconds.  Neurological:     Mental Status: She is alert and oriented to person, place, and time.  Psychiatric:        Mood and Affect: Mood normal.        Behavior: Behavior normal.     Comments: Patient anxious, tearful     ED Results / Procedures / Treatments   Labs (all labs ordered are listed, but only abnormal results are displayed) Labs Reviewed  COMPREHENSIVE METABOLIC PANEL - Abnormal; Notable for the following components:      Result Value   CO2 12 (*)    Glucose, Bld 159 (*)    BUN 49 (*)    Creatinine, Ser 5.59 (*)    Calcium  8.0 (*)    AST 14 (*)    GFR, Estimated 9 (*)    All other components within normal limits  LIPASE, BLOOD - Abnormal; Notable for the following components:   Lipase 62 (*)    All other components within normal limits  CBC WITH DIFFERENTIAL/PLATELET - Abnormal; Notable for the following components:   RBC 3.37 (*)    Hemoglobin 8.3 (*)    HCT 27.2 (*)    MCH 24.6 (*)    RDW 16.8 (*)    All other components within normal limits  URINALYSIS, ROUTINE W REFLEX MICROSCOPIC - Abnormal; Notable for the following components:   Color, Urine STRAW (*)    Glucose, UA 150 (*)    Hgb urine dipstick SMALL (*)    Ketones, ur 5 (*)    Protein, ur >=300 (*)    All other components within normal limits  RAPID URINE DRUG SCREEN, HOSP PERFORMED - Abnormal; Notable for the following components:   Tetrahydrocannabinol POSITIVE (*)    All other components within normal limits    EKG EKG Interpretation Date/Time:  Saturday January 22 2023 01:50:03 EST Ventricular Rate:  118 PR Interval:  163 QRS Duration:  76 QT Interval:  339 QTC Calculation: 475 R  Axis:   85  Text Interpretation: Sinus tachycardia Rate faster Confirmed by Carita Senior [phone]) on 01/22/2023 1:59:01 AM  Radiology CT ABDOMEN PELVIS WO CONTRAST Result Date: 01/21/2023 CLINICAL DATA:  Abdominal pain, acute, nonlocalized. Nausea/vomiting and left-sided pain since last night. EXAM: CT ABDOMEN AND PELVIS WITHOUT CONTRAST TECHNIQUE: Multidetector CT imaging of the abdomen and pelvis was performed following  the standard protocol without IV contrast. RADIATION DOSE REDUCTION: This exam was performed according to the departmental dose-optimization program which includes automated exposure control, adjustment of the mA and/or kV according to patient size and/or use of iterative reconstruction technique. COMPARISON:  CT scan abdomen and pelvis from 09/23/2020. FINDINGS: Lower chest: The lung bases are clear. No pleural effusion. The heart is normal in size. No pericardial effusion. Hepatobiliary: The liver is normal in size. Non-cirrhotic configuration. No suspicious mass. No intrahepatic or extrahepatic bile duct dilation. No calcified gallstones. Normal gallbladder wall thickness. No pericholecystic inflammatory changes. Pancreas: Unremarkable. No pancreatic ductal dilatation or surrounding inflammatory changes. Spleen: Within normal limits. No focal lesion. Adrenals/Urinary Tract: Adrenal glands are unremarkable. No suspicious renal mass. No hydronephrosis. No renal or ureteric calculi. Urinary bladder is under distended, precluding optimal assessment. However, no large mass or stones identified. No perivesical fat stranding. Stomach/Bowel: There is a small sliding hiatal hernia. No disproportionate dilation of the small or large bowel loops. No evidence of abnormal bowel wall thickening or inflammatory changes. The appendix is unremarkable. There is small-to-moderate stool burden throughout the colon. Vascular/Lymphatic: No ascites or pneumoperitoneum. No abdominal or pelvic lymphadenopathy, by  size criteria. No aneurysmal dilation of the major abdominal arteries. Reproductive: The uterus is unremarkable. No large adnexal mass. Other: The visualized soft tissues and abdominal wall are unremarkable. Musculoskeletal: No suspicious osseous lesions. IMPRESSION: 1. No acute inflammatory process identified within the abdomen or pelvis. 2. No bowel obstruction. 3. Multiple other nonacute observations, as described above. Electronically Signed   By: Ree Molt M.D.   On: 01/21/2023 09:59    Procedures Procedures    Medications Ordered in ED Medications  droperidol  (INAPSINE ) 2.5 MG/ML injection 2.5 mg (2.5 mg Intravenous Given 01/22/23 0213)  metoCLOPramide  (REGLAN ) injection 10 mg (10 mg Intravenous Given 01/22/23 0335)  LORazepam  (ATIVAN ) injection 0.5 mg (0.5 mg Intravenous Given 01/22/23 0345)    ED Course/ Medical Decision Making/ A&P                                 Medical Decision Making Amount and/or Complexity of Data Reviewed Labs: ordered.  Risk Prescription drug management.   This patient presents to the ED for concern of abdominal pain with nausea and vomiting, this involves an extensive number of treatment options, and is a complaint that carries with it a high risk of complications and morbidity.  The differential diagnosis includes cyclical vomiting syndrome, cannabinoid hyperemesis syndrome, pancreatitis, appendicitis, cholecystitis, others   Co morbidities that complicate the patient evaluation  Marijuana usage, CKD stage V   Additional history obtained:  Additional history obtained from nursing External records from outside source obtained and reviewed including previous notes   Lab Tests:  I Ordered, and personally interpreted labs.  The pertinent results include: Lipase 62, creatinine 5.59 appears to be at baseline, BUN 49 grossly at baseline   Imaging Studies ordered:  I reviewed CT scan from earlier today which showed no acute finding to explain  patient's symptoms   Cardiac Monitoring: / EKG:  The patient was maintained on a cardiac monitor.  I personally viewed and interpreted the cardiac monitored which showed an underlying rhythm of: Sinus tachycardia   Problem List / ED Course / Critical interventions / Medication management   I ordered medication including droperidol , reglan , ativan  for nausea/vomiting Reevaluation of the patient after these medicines showed that the patient improved I have reviewed  the patients home medicines and have made adjustments as needed   Social Determinants of Health:  Patient has Medicaid for her primary health insurance   Test / Admission - Considered:  Patient passed p.o. challenge feeling much better after medication.  At this time see no indication for further emergent workup or admission.  I have advised patient to discontinue marijuana usage as this could be contributing to some of her symptoms.  She had a negative CT scan earlier in the day and I see no indication for repeat CT scan at this time.  Plan to discharge home with return precautions.         Final Clinical Impression(s) / ED Diagnoses Final diagnoses:  Generalized abdominal pain  Nausea and vomiting, unspecified vomiting type    Rx / DC Orders ED Discharge Orders     None         Logan Ubaldo KATHEE DEVONNA 01/22/23 0531    Carita Senior, MD 01/22/23 [phone]

## 2023-01-22 NOTE — ED Notes (Signed)
 Notified EDP in regards to pt's current vitals, EDP clears pt for d/c.

## 2023-01-22 NOTE — ED Notes (Signed)
 [place] PIV placed.

## 2023-01-23 ENCOUNTER — Emergency Department (HOSPITAL_COMMUNITY): Payer: Medicaid Other

## 2023-01-23 ENCOUNTER — Encounter (HOSPITAL_COMMUNITY): Payer: Self-pay

## 2023-01-23 ENCOUNTER — Other Ambulatory Visit: Payer: Self-pay

## 2023-01-23 ENCOUNTER — Emergency Department (HOSPITAL_COMMUNITY)
Admission: EM | Admit: 2023-01-23 | Discharge: 2023-01-24 | Disposition: A | Discharge status: Against Medical Advice | Payer: Medicaid Other | Attending: Emergency Medicine | Admitting: Emergency Medicine

## 2023-01-23 DIAGNOSIS — Z5329 Procedure and treatment not carried out because of patient's decision for other reasons: Secondary | ICD-10-CM | POA: Insufficient documentation

## 2023-01-23 DIAGNOSIS — R112 Nausea with vomiting, unspecified: Secondary | ICD-10-CM | POA: Diagnosis not present

## 2023-01-23 DIAGNOSIS — R000 Tachycardia, unspecified: Secondary | ICD-10-CM | POA: Diagnosis not present

## 2023-01-23 DIAGNOSIS — Z992 Dependence on renal dialysis: Secondary | ICD-10-CM | POA: Insufficient documentation

## 2023-01-23 DIAGNOSIS — E1122 Type 2 diabetes mellitus with diabetic chronic kidney disease: Secondary | ICD-10-CM | POA: Diagnosis not present

## 2023-01-23 DIAGNOSIS — K5900 Constipation, unspecified: Secondary | ICD-10-CM | POA: Diagnosis not present

## 2023-01-23 DIAGNOSIS — N186 End stage renal disease: Secondary | ICD-10-CM | POA: Insufficient documentation

## 2023-01-23 DIAGNOSIS — R1084 Generalized abdominal pain: Secondary | ICD-10-CM | POA: Diagnosis not present

## 2023-01-23 DIAGNOSIS — R42 Dizziness and giddiness: Secondary | ICD-10-CM | POA: Diagnosis not present

## 2023-01-23 DIAGNOSIS — Z79899 Other long term (current) drug therapy: Secondary | ICD-10-CM | POA: Diagnosis not present

## 2023-01-23 DIAGNOSIS — J45909 Unspecified asthma, uncomplicated: Secondary | ICD-10-CM | POA: Diagnosis not present

## 2023-01-23 LAB — COMPREHENSIVE METABOLIC PANEL WITH GFR
ALT: 11 U/L (ref 0–44)
AST: 15 U/L (ref 15–41)
Albumin: 3.3 g/dL — ABNORMAL LOW (ref 3.5–5.0)
Alkaline Phosphatase: 67 U/L (ref 38–126)
Anion gap: 14 (ref 5–15)
BUN: 59 mg/dL — ABNORMAL HIGH (ref 6–20)
CO2: 13 mmol/L — ABNORMAL LOW (ref 22–32)
Calcium: 8 mg/dL — ABNORMAL LOW (ref 8.9–10.3)
Chloride: 107 mmol/L (ref 98–111)
Creatinine, Ser: 5.74 mg/dL — ABNORMAL HIGH (ref 0.44–1.00)
GFR, Estimated: 9 mL/min — ABNORMAL LOW
Glucose, Bld: 170 mg/dL — ABNORMAL HIGH (ref 70–99)
Potassium: 4.3 mmol/L (ref 3.5–5.1)
Sodium: 134 mmol/L — ABNORMAL LOW (ref 135–145)
Total Bilirubin: 0.9 mg/dL (ref 0.0–1.2)
Total Protein: 7.1 g/dL (ref 6.5–8.1)

## 2023-01-23 LAB — CBC
HCT: 33.6 % — ABNORMAL LOW (ref 36.0–46.0)
Hemoglobin: 10.9 g/dL — ABNORMAL LOW (ref 12.0–15.0)
MCH: 24.8 pg — ABNORMAL LOW (ref 26.0–34.0)
MCHC: 32.4 g/dL (ref 30.0–36.0)
MCV: 76.5 fL — ABNORMAL LOW (ref 80.0–100.0)
Platelets: 265 K/uL (ref 150–400)
RBC: 4.39 MIL/uL (ref 3.87–5.11)
RDW: 16 % — ABNORMAL HIGH (ref 11.5–15.5)
WBC: 9.7 K/uL (ref 4.0–10.5)
nRBC: 0 % (ref 0.0–0.2)

## 2023-01-23 LAB — HCG, SERUM, QUALITATIVE: Preg, Serum: NEGATIVE

## 2023-01-23 LAB — LIPASE, BLOOD: Lipase: 46 U/L (ref 11–51)

## 2023-01-23 MED ORDER — ONDANSETRON 4 MG PO TBDP
4.0000 mg | ORAL_TABLET | Freq: Once | ORAL | Status: AC | PRN
  Administered 2023-01-23: 4 mg via ORAL
  Filled 2023-01-23: qty 1

## 2023-01-23 NOTE — ED Provider Triage Note (Cosign Needed)
 Emergency Medicine Provider Triage Evaluation Note  Michiko Lineman , a 43 y.o. female  was evaluated in triage.  Pt complains of abdominal pain.  Patient states that she has been seen multiple times since Friday including imaging and states she has not had a bowel movement since Friday.  Patient has tried an enema to no relief.  Patient states that she has had emesis with this.  Patient denies fevers or changes in symptoms since last being seen..  Review of Systems  Positive: See HPI Negative: See HPI  Physical Exam  Ht 5' 7 (1.702 m)   Wt 79.8 kg   LMP 12/05/2022   BMI 27.56 kg/m  Gen:   Awake, no distress   Resp:  Normal effort  MSK:   Moves extremities without difficulty  Other:  Generalized abdominal tenderness without peritoneal signs  Medical Decision Making  Medically screening exam initiated at 9:00 PM.  Appropriate orders placed.  Tangelia Sanson was informed that the remainder of the evaluation will be completed by another provider, this initial triage assessment does not replace that evaluation, and the importance of remaining in the ED until their evaluation is complete.  Workup initiated, patient stable at this time   Victor Lynwood ONEIDA DEVONNA 01/23/23 2100

## 2023-01-23 NOTE — ED Triage Notes (Addendum)
 Pt BIB [facility] from home d/t Right ABD pain with N/V/constipation - near syncope when EMS did orthostatic VS.   Pt was seen for same @ [facility] 2 days ago

## 2023-01-24 DIAGNOSIS — N185 Chronic kidney disease, stage 5: Secondary | ICD-10-CM

## 2023-01-24 DIAGNOSIS — R112 Nausea with vomiting, unspecified: Secondary | ICD-10-CM | POA: Diagnosis present

## 2023-01-24 DIAGNOSIS — R1084 Generalized abdominal pain: Secondary | ICD-10-CM

## 2023-01-24 LAB — URINALYSIS, ROUTINE W REFLEX MICROSCOPIC
Bilirubin Urine: NEGATIVE
Glucose, UA: 50 mg/dL — AB
Ketones, ur: NEGATIVE mg/dL
Leukocytes,Ua: NEGATIVE
Nitrite: NEGATIVE
Protein, ur: >=300 mg/dL — AB
Specific Gravity, Urine: 1.015 (ref 1.005–1.030)
pH: 5 (ref 5.0–8.0)

## 2023-01-24 LAB — IRON AND TIBC
Iron: 63 ug/dL (ref 28–170)
Saturation Ratios: 23 % (ref 10.4–31.8)
TIBC: 272 ug/dL (ref 250–450)
UIBC: 209 ug/dL

## 2023-01-24 LAB — FERRITIN: Ferritin: 117 ng/mL (ref 11–307)

## 2023-01-24 MED ORDER — CALCIUM CARBONATE ANTACID 500 MG PO CHEW: 200.0000 mg | CHEWABLE_TABLET | Freq: Three times a day (TID) | ORAL | Status: DC

## 2023-01-24 MED ORDER — HYDRALAZINE HCL 25 MG PO TABS
100.0000 mg | ORAL_TABLET | Freq: Three times a day (TID) | ORAL | Status: DC
  Administered 2023-01-24: 100 mg via ORAL
  Filled 2023-01-24: qty 4

## 2023-01-24 MED ORDER — SODIUM BICARBONATE 650 MG PO TABS
1300.0000 mg | ORAL_TABLET | Freq: Two times a day (BID) | ORAL | Status: DC
  Administered 2023-01-24: 1300 mg via ORAL
  Filled 2023-01-24: qty 2

## 2023-01-24 MED ORDER — SENNOSIDES-DOCUSATE SODIUM 8.6-50 MG PO TABS
2.0000 | ORAL_TABLET | Freq: Two times a day (BID) | ORAL | Status: DC
  Administered 2023-01-24: 2 via ORAL
  Filled 2023-01-24: qty 2

## 2023-01-24 MED ORDER — SENNOSIDES-DOCUSATE SODIUM 8.6-50 MG PO TABS: 1.0000 | ORAL_TABLET | Freq: Two times a day (BID) | ORAL | Status: DC

## 2023-01-24 MED ORDER — METOPROLOL SUCCINATE ER 25 MG PO TB24
75.0000 mg | ORAL_TABLET | Freq: Every day | ORAL | Status: DC
  Administered 2023-01-24: 75 mg via ORAL
  Filled 2023-01-24: qty 3

## 2023-01-24 MED ORDER — ACETAMINOPHEN 650 MG RE SUPP: 650.0000 mg | Freq: Four times a day (QID) | RECTAL | Status: DC | PRN

## 2023-01-24 MED ORDER — CALCITRIOL 0.25 MCG PO CAPS
0.2500 ug | ORAL_CAPSULE | Freq: Every day | ORAL | Status: DC
  Filled 2023-01-24: qty 1

## 2023-01-24 MED ORDER — ONDANSETRON HCL 4 MG/2ML IJ SOLN: 4.0000 mg | Freq: Four times a day (QID) | INTRAMUSCULAR | Status: DC | PRN

## 2023-01-24 MED ORDER — HEPARIN SODIUM (PORCINE) 5000 UNIT/ML IJ SOLN: 5000.0000 [IU] | Freq: Three times a day (TID) | INTRAMUSCULAR | Status: DC

## 2023-01-24 MED ORDER — DROPERIDOL 2.5 MG/ML IJ SOLN
2.5000 mg | Freq: Once | INTRAMUSCULAR | Status: AC
  Administered 2023-01-24: 2.5 mg via INTRAVENOUS
  Filled 2023-01-24: qty 2

## 2023-01-24 MED ORDER — ACETAMINOPHEN 325 MG PO TABS: 650.0000 mg | ORAL_TABLET | Freq: Four times a day (QID) | ORAL | Status: DC | PRN

## 2023-01-24 MED ORDER — ISOSORBIDE MONONITRATE ER 30 MG PO TB24
60.0000 mg | ORAL_TABLET | Freq: Every day | ORAL | Status: DC
  Administered 2023-01-24: 60 mg via ORAL
  Filled 2023-01-24: qty 2

## 2023-01-24 MED ORDER — POLYETHYLENE GLYCOL 3350 17 G PO PACK
17.0000 g | PACK | Freq: Every day | ORAL | Status: DC
  Administered 2023-01-24: 17 g via ORAL
  Filled 2023-01-24: qty 1

## 2023-01-24 NOTE — ED Provider Notes (Cosign Needed)
 Received patient at sign out from previous provider (see his note)  In short, patient presents to ED for N/V, left sided abdominal pain. She was seen twice on 01/21/23 for similar complaints with unremarkable workup to explain symptoms. CT 01/21/23 negative for acute pathology. However, has become increasingly acidotic over past three days. Symptoms could be related to cannabis hyperemesis syndrome as she admitted to smoking at home to attempt to aid in symptoms.  Today, she received zofran  and Droperidol  without improvement to symptoms. She has an appointment tomorrow with IM and would benefit from nephrology consult as she would likely benefit from dialysis long term  IM Rayann Atway accepts patient for admission for intractable nausea, worsening anion gap over past three days.   Minnie Tinnie BRAVO, [place] 01/24/23 [phone]

## 2023-01-24 NOTE — H&P (Signed)
 Date: 01/24/2023               Patient Name:  [name] MRN: [id number]  DOB: 1980-11-13 Age / Sex: 43 y.o., female   PCP: Arellano Zameza, Priscila, MD         Medical Service: Internal Medicine Teaching Service         Attending Physician: Dr. Rosan Dayton BROCKS, DO      First Contact: Dr. Damien Lease, DO Pager [phone]    Second Contact: Dr. Ozell Kung, MD Pager [phone]         After Hours (After 5p/  First Contact Pager: [phone]  weekends / holidays): Second Contact Pager: [phone]   SUBJECTIVE   Chief Complaint: nausea, vomiting  History of Present Illness: [name] is a 43 yo female with hypertension, CKD 5, asthma, and type 2 diabetes who presents with abdominal pain, nausea, and vomiting.   The patient has been seen in the ED for this chief complaint 3 times in the last 3 days. The last two presentations, she was discharged back home after an improvement in her symptoms. Today, she reports continued generalized abdominal pain with episodes of nausea and vomiting at home. She has not been able to keep any food or fluids down despite trying zofran  and benadryl . She received zofran  in the ED today too, which did not help her nausea, but she states that the droperidol  has helped her symptoms and she has not vomited since getting this medication.  Of note, the patient does smoke marijuana. She smoked marijuana on 1/3 after she went to the ED for the first time to see if this would make her feel better, but it made her symptoms worse. Prior to 1/3, she had smoked marijuana a few days before this. She took multiple hot showers at home and states that this did not help her symptoms.   The patient denies any fevers, chills, chest pain, SOB, diarrhea, hematemesis, dysuria, or hematuria. Last BM was about 5 days ago. She states that she tried to give herself an enema 2 days ago, but that did not help her have a bowel movement.     Meds:  Calcitriol  0.25 mcg daily Calcium   carbonate 200 mg TID Bendyl 20 mg BID  Ferrous sulfate  325 mg daily  Folic acid  1 mg daily Hydralazine  100 mg TID Imdur  60 mg daily Metoprolol  75 mg daily Zofran  4 mg q6h PRN Protonix  40 mg daily Sodium bicarb 1300 mg BID Vitamin D  50,000U weekly   No outpatient medications have been marked as taking for the 01/23/23 encounter [facility] Encounter).    Past Medical History  Past Surgical History:  Procedure Laterality Date   ESOPHAGOGASTRODUODENOSCOPY (EGD) WITH PROPOFOL  N/A 08/07/2019   Procedure: ESOPHAGOGASTRODUODENOSCOPY (EGD) WITH PROPOFOL ;  Surgeon: Abran Norleen SAILOR, MD;  Location: [facility] ENDOSCOPY;  Service: Endoscopy;  Laterality: N/A;   NO PAST SURGERIES     TUBAL LIGATION  06/20/2011   Procedure: POST PARTUM TUBAL LIGATION;  Surgeon: Winton Felt, MD;  Location: [facility] ORS;  Service: Gynecology;  Laterality: Bilateral;  Bilateral post partum tubal ligation    Social:  Lives With: son and her boyfriend at home 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 Smokes marijuana 1-2 times per week  Family History:  Mother has a hx of ESRD on HD  Allergies: Allergies as of 01/23/2023 - Review Complete 01/23/2023  Allergen  Reaction Noted   Shellfish allergy Anaphylaxis and Swelling 04/05/2012   Morphine  and codeine Nausea And Vomiting 08/03/2010    Review of Systems: A complete ROS was negative except as per HPI.   OBJECTIVE:   Physical Exam: Blood pressure (!) 203/116, pulse (!) 111, temperature 98.4 F (36.9 C), temperature source Oral, resp. rate 12, height 5' 7 (1.702 m), weight 79.8 kg, last menstrual period 12/05/2022, SpO2 100%.   Constitutional: ill-appearing middle aged female laying in bed, in no acute distress Cardiovascular: tachycardic rate and regular rhythm, no m/r/g Pulmonary/Chest: normal work of breathing on room air, lungs clear to auscultation  bilaterally Abdominal: soft,non-distended, tender to palpation in LLQ, normal bowel sounds  MSK: normal bulk and tone Neurological: alert & oriented x 3, no focal deficit Skin: warm and dry Psych: normal mood and affect  Labs: CBC    Component Value Date/Time   WBC 9.7 01/23/2023 2101   RBC 4.39 01/23/2023 2101   HGB 10.9 (L) 01/23/2023 2101   HGB 8.5 (L) 08/31/2022 1620   HCT 33.6 (L) 01/23/2023 2101   HCT 29.2 (L) 08/31/2022 1620   PLT 265 01/23/2023 2101   PLT 428 08/31/2022 1620   MCV 76.5 (L) 01/23/2023 2101   MCV 77 (L) 08/31/2022 1620   MCH 24.8 (L) 01/23/2023 2101   MCHC 32.4 01/23/2023 2101   RDW 16.0 (H) 01/23/2023 2101   RDW 22.3 (H) 08/31/2022 1620   LYMPHSABS 1.0 01/21/2023 2216   MONOABS 0.3 01/21/2023 2216   EOSABS 0.0 01/21/2023 2216   BASOSABS 0.0 01/21/2023 2216     CMP     Component Value Date/Time   NA 134 (L) 01/23/2023 2101   NA 136 09/16/2022 1445   K 4.3 01/23/2023 2101   CL 107 01/23/2023 2101   CO2 13 (L) 01/23/2023 2101   GLUCOSE 170 (H) 01/23/2023 2101   BUN 59 (H) 01/23/2023 2101   BUN 40 (H) 09/16/2022 1445   CREATININE 5.74 (H) 01/23/2023 2101   CREATININE 1.24 (H) 06/17/2011 1051   CALCIUM  8.0 (L) 01/23/2023 2101   PROT 7.1 01/23/2023 2101   PROT 6.8 09/16/2022 1445   ALBUMIN 3.3 (L) 01/23/2023 2101   ALBUMIN 3.8 (L) 09/16/2022 1445   AST 15 01/23/2023 2101   ALT 11 01/23/2023 2101   ALKPHOS 67 01/23/2023 2101   BILITOT 0.9 01/23/2023 2101   BILITOT <0.2 09/16/2022 1445   GFRNONAA 9 (L) 01/23/2023 2101   GFRAA 22 (L) 08/08/2019 0314    Imaging: Abd XR: No obstruction visualized  EKG: personally reviewed my interpretation is sinus tachycardia with Qtc of 452 ms. Prior EKG reviewed and appears similar.   ASSESSMENT & PLAN:   Assessment & Plan by Problem: Active Problems:   Intractable nausea and vomiting   [name] is a 43 y.o. person living with person living with a history of hypertension, CKD 5, asthma, and type 2 diabetes who  presented with nausea, vomiting, and abdominal pain and admitted for hyperemesis/intractable nausea and vomiting on hospital day 0  Intractable nausea and vomiting Notable negative labs include a normal lipase and white blood cell count. hCG negative. Differential diagnosis includes, but is not limited to cannabinoid hyperemesis syndrome, cyclic vomiting syndrome, gastroparesis, uremia/advanced CKD. CT abdomen/pelvis was obtained three days ago and was negative for any inflammatory process within the abdomen and there was no evidence of bowel obstruction. Abd Xray today notable for non-obstructed bowel gas pattern with moderate colonic stool load.  - S/p 1 dose droperidol  - IV zofran   4 mg q6h PRN for n/v - Counseled on marijuana cessation  - Clear liquid diet, advance as patient tolerates  CKD 5 Metabolic acidosis The patient states that her first appt with nephrology is on 1/10. Labs notable for worsening metabolic acidosis, bicarb 13. Cr 5.74 with GFR of 9, which has been stable dating back to November. No urgent indication for HD at this time (patient is acidotic, but stable - BMP - Avoid nephrotoxins - Consult nephrology if patient becomes more acidotic/uremic or there is another indication for urgent HD - Resumed home calcium  carbonate and sodium bicarb   Anemia of chronic disease Hb 10.9, MCV 76.5. Patient is prescribed ferrous sulfate  as an outpatient, but she is not sure if she takes this medication. - Iron , TIBC, ferritin pending - CBC  Hypocalcemia Secondary to advanced CKD. Calcium  stable at 8.0.  - Calcitriol  0.25 mcg daily  Hypertension On hydralazine  100 mg TID, imdur  60 mg daily, and metoprolol  75 mg daily at home. The patient is unsure which medications she takes, although no one helps her with her meds at home.  - Resumed hydralazine , imdur , and metoprolol    Constipation Last BM around 1/1. No evidence of obstruction seen on imaging. - Senna-docusate 2 tablets BID -  Miralax  daily  Type 2 diabetes Last A1c 6.1% not on any medications. Her diabetes appears to be well controlled now, but has been uncontrolled in the past, with A1cs >10 in 2018 and 2020.  - A1c  Diet:  Clear liquid diet VTE: Heparin  IVF: None,None Code: Full  Prior to Admission Living Arrangement: Home, living family Anticipated Discharge Location: Home Barriers to Discharge: medical stability  Dispo: Admit patient to Observation with expected length of stay less than 2 midnights.  Signed: Patrese Neal N, DO Internal Medicine Resident PGY-3  01/24/2023, 8:35 AM

## 2023-01-24 NOTE — ED Notes (Signed)
 Pt states she wants to leave because she is cold and tired of waiting. Spoke w/pt regarding all IP orders being followed, plan for stay, risks of leaving. Pt reiterated that she wants to leave. AMA paperwork signed.

## 2023-01-24 NOTE — ED Provider Notes (Signed)
 [place] EMERGENCY DEPARTMENT AT [facility] Provider Note   CSN: [id number] Arrival date & time: 01/23/23  2032     History  Chief Complaint  Patient presents with   Abdominal Pain    [name] is a 43 y.o. female.  Patient presents to the emergency department via EMS complaining of ongoing abdominal pain with nausea, vomiting.  This is the patient's third visit for similar symptoms in the past 3 days.  She comes of generalized abdominal pain.  She also endorses not having a recent bowel movement.  The patient has been treated with droperidol  during emergent department visits with some improvement in symptoms.  At her last visit she was discharged after successfully passing a p.o. challenge but states that since going home she has continued to have frequent episodes of nausea and vomiting and has been unable to keep any fluids down.  At home she has tried Zofran  and Bentyl .  She endorses smoking marijuana approximately once weekly.  She denies alcohol usage. Past medical history significant for depression, diabetes, asthma, stage V CKD without dialysis, anxiety.  She states that she is supposed to see a nephrologist later this month to discuss possible initiation of hemodialysis.  Abdominal Pain      Home Medications Prior to Admission medications   Medication Sig Start Date End Date Taking? Authorizing Provider  acetaminophen  (TYLENOL ) 500 MG tablet Take 1,000 mg by mouth every 6 (six) hours as needed for moderate pain.    [provider]  calcitRIOL  (ROCALTROL ) 0.25 MCG capsule Take 1 capsule (0.25 mcg total) by mouth daily. 10/02/22   Marylu Gee, DO  calcium  carbonate (TUMS - DOSED IN MG ELEMENTAL CALCIUM ) 500 MG chewable tablet Chew 1 tablet (200 mg of elemental calcium  total) by mouth 3 (three) times daily between meals. 10/01/22   Marylu Gee, DO  dicyclomine  (BENTYL ) 20 MG tablet Take 1 tablet (20 mg total) by mouth 2 (two) times daily. 01/21/23    Prosperi, Christian H, PA-C  ferrous sulfate  325 (65 FE) MG tablet Take 1 tablet (325 mg total) by mouth daily. 10/02/22   Marylu Gee, DO  folic acid  (FOLVITE ) 1 MG tablet Take 1 tablet (1 mg total) by mouth daily. 10/02/22   Marylu Gee, DO  hydrALAZINE  (APRESOLINE ) 100 MG tablet Take 1 tablet (100 mg total) by mouth 3 (three) times daily. 12/29/22 01/28/23  Vernon Ranks, MD  isosorbide  mononitrate (IMDUR ) 60 MG 24 hr tablet Take 1 tablet (60 mg total) by mouth daily. 12/30/22 01/29/23  Vernon Ranks, MD  metoprolol  succinate (TOPROL -XL) 50 MG 24 hr tablet Take 1.5 tablets (75 mg total) by mouth daily. Take with or immediately following a meal. 11/29/22   Arellano Zameza, Priscila, MD  ondansetron  (ZOFRAN -ODT) 4 MG disintegrating tablet Take 1 tablet (4 mg total) by mouth every 6 (six) hours as needed for nausea or vomiting. 01/21/23   Prosperi, Christian H, PA-C  pantoprazole  (PROTONIX ) 40 MG tablet Take 1 tablet (40 mg total) by mouth daily. 10/01/22   Marylu Gee, DO  sodium bicarbonate  650 MG tablet Take 2 tablets (1,300 mg total) by mouth 2 (two) times daily. 10/01/22 10/01/23  Marylu Gee, DO  Vitamin D , Ergocalciferol , (DRISDOL ) 1.25 MG (50000 UNIT) CAPS capsule Take 1 capsule (50,000 Units total) by mouth every Monday. 12/06/22   Arellano Zameza, Priscila, MD      Allergies    Shellfish allergy and Morphine  and codeine    Review of Systems   Review of Systems  Gastrointestinal:  Positive for abdominal pain.    Physical Exam Updated Vital Signs BP (!) 152/109   Pulse (!) 106   Temp 98.1 F (36.7 C) (Oral)   Resp (!) 22   Ht 5' 7 (1.702 m)   Wt 79.8 kg   LMP 12/05/2022   SpO2 100%   BMI 27.56 kg/m  Physical Exam Vitals and nursing note reviewed.  Constitutional:      Appearance: Normal appearance.     Comments: Patient actively vomiting  HENT:     Head: Normocephalic and atraumatic.  Eyes:     General:        Right eye: No discharge.        Left eye: No discharge.   Cardiovascular:     Rate and Rhythm: Normal rate and regular rhythm.     Heart sounds: No murmur heard. Pulmonary:     Effort: Pulmonary effort is normal.     Breath sounds: Normal breath sounds.  Abdominal:     General: Bowel sounds are normal.     Palpations: Abdomen is soft.     Comments: Diffuse mild tenderness with no focal tenderness  Skin:    General: Skin is warm and dry.     Capillary Refill: Capillary refill takes less than 2 seconds.  Neurological:     Mental Status: She is alert and oriented to person, place, and time.  Psychiatric:        Mood and Affect: Mood normal.        Behavior: Behavior normal.     ED Results / Procedures / Treatments   Labs (all labs ordered are listed, but only abnormal results are displayed) Labs Reviewed  COMPREHENSIVE METABOLIC PANEL - Abnormal; Notable for the following components:      Result Value   Sodium 134 (*)    CO2 13 (*)    Glucose, Bld 170 (*)    BUN 59 (*)    Creatinine, Ser 5.74 (*)    Calcium  8.0 (*)    Albumin 3.3 (*)    GFR, Estimated 9 (*)    All other components within normal limits  CBC - Abnormal; Notable for the following components:   Hemoglobin 10.9 (*)    HCT 33.6 (*)    MCV 76.5 (*)    MCH 24.8 (*)    RDW 16.0 (*)    All other components within normal limits  LIPASE, BLOOD  HCG, SERUM, QUALITATIVE  URINALYSIS, ROUTINE W REFLEX MICROSCOPIC    EKG None  Radiology DG Abd Portable 1V Result Date: 01/23/2023 CLINICAL DATA:  Constipation EXAM: PORTABLE ABDOMEN - 1 VIEW COMPARISON:  CT abdomen and pelvis 01/21/2023 FINDINGS: Nonobstructive bowel-gas pattern. Moderate colonic stool load. No radio-opaque calculi or other significant radiographic abnormality are seen. IMPRESSION: Nonobstructive bowel-gas pattern. Moderate colonic stool load. Electronically Signed   By: Norman Gatlin M.D.   On: 01/23/2023 21:31    Procedures Procedures    Medications Ordered in ED Medications  ondansetron   (ZOFRAN -ODT) disintegrating tablet 4 mg (4 mg Oral Given 01/23/23 2041)  droperidol  (INAPSINE ) 2.5 MG/ML injection 2.5 mg (2.5 mg Intravenous Given 01/24/23 0549)    ED Course/ Medical Decision Making/ A&P                                 Medical Decision Making Amount and/or Complexity of Data Reviewed Labs: ordered.  Risk Prescription drug management.   This  patient presents to the ED for concern of abdominal pain with intractable nausea and vomiting, this involves an extensive number of treatment options, and is a complaint that carries with it a high risk of complications and morbidity.  The differential diagnosis includes uremic vomiting, cannabinoid hyperemesis syndrome, gastroparesis, small bowel obstruction, others   Co morbidities that complicate the patient evaluation  Type II DM, stage V CKD   Additional history obtained:  Additional history obtained from EMS External records from outside source obtained and reviewed including recent discharge summary   Lab Tests:  I Ordered, and personally interpreted labs.  The pertinent results include: Bicarb 13, BUN 59 worsened from earlier in the week, creatinine 5.74, negative pregnancy test, lipase 46   Imaging Studies ordered:  I ordered imaging studies including abdominal x-ray I independently visualized and interpreted imaging which showed Nonobstructive bowel-gas pattern. Moderate colonic stool load.  I agree with the radiologist interpretation   Cardiac Monitoring: / EKG:  The patient was maintained on a cardiac monitor.  I personally viewed and interpreted the cardiac monitored which showed an underlying rhythm of: sinus tachycardia   Consultations Obtained:  I requested consultation with the internal medicine resident,  and discussed lab and imaging findings as well as pertinent plan - they recommend: repage if unable to get nausea/vomiting under control; patient with outpatient appointment scheduled with clinic  on 1/7   Problem List / ED Course / Critical interventions / Medication management   I ordered medication including Zofran  and droperidol  for nausea and vomiting Reevaluation of the patient after these medicines showed that the  patient had mild improvement I have reviewed the patients home medicines and have made adjustments as needed   Test / Admission - Considered:  Patient continued have nausea and vomiting at home not relieved by home medications.  Patient care being transferred to Tinnie Matter, PA-C at shift handoff. Plan to reassess after droperidol . If patient has persistent nausea vomiting, admit to internal medicine. Concern about worsening kidney function as well with worsening uremia and bicarb. Disposition pending reassessment         Final Clinical Impression(s) / ED Diagnoses Final diagnoses:  Generalized abdominal pain  Intractable nausea and vomiting    Rx / DC Orders ED Discharge Orders     None         Logan Ubaldo KATHEE DEVONNA 01/24/23 [id number]    Lorette Mayo, MD 01/31/23 2252

## 2023-01-25 ENCOUNTER — Encounter: Payer: Self-pay | Admitting: Student

## 2023-01-25 ENCOUNTER — Other Ambulatory Visit: Payer: Self-pay

## 2023-01-25 ENCOUNTER — Ambulatory Visit: Payer: Medicaid Other | Admitting: Student

## 2023-01-25 VITALS — BP 119/62 | HR 67 | Temp 98.1°F | Ht 67.0 in | Wt 167.4 lb

## 2023-01-25 DIAGNOSIS — I120 Hypertensive chronic kidney disease with stage 5 chronic kidney disease or end stage renal disease: Secondary | ICD-10-CM

## 2023-01-25 DIAGNOSIS — R112 Nausea with vomiting, unspecified: Secondary | ICD-10-CM | POA: Diagnosis not present

## 2023-01-25 DIAGNOSIS — N179 Acute kidney failure, unspecified: Secondary | ICD-10-CM

## 2023-01-25 DIAGNOSIS — N185 Chronic kidney disease, stage 5: Secondary | ICD-10-CM

## 2023-01-25 DIAGNOSIS — I10 Essential (primary) hypertension: Secondary | ICD-10-CM

## 2023-01-25 LAB — RENAL FUNCTION PANEL
Albumin: 3.5 g/dL (ref 3.5–5.0)
Anion gap: 14 (ref 5–15)
BUN: 70 mg/dL — ABNORMAL HIGH (ref 6–20)
CO2: 15 mmol/L — ABNORMAL LOW (ref 22–32)
Calcium: 8.1 mg/dL — ABNORMAL LOW (ref 8.9–10.3)
Chloride: 103 mmol/L (ref 98–111)
Creatinine, Ser: 7.31 mg/dL — ABNORMAL HIGH (ref 0.44–1.00)
GFR, Estimated: 7 mL/min — ABNORMAL LOW (ref >60)
Glucose, Bld: 165 mg/dL — ABNORMAL HIGH (ref 70–99)
Phosphorus: 3.4 mg/dL (ref 2.5–4.6)
Potassium: 4 mmol/L (ref 3.5–5.1)
Sodium: 132 mmol/L — ABNORMAL LOW (ref 135–145)

## 2023-01-25 LAB — HEMOGLOBIN A1C
Hgb A1c MFr Bld: 5.8 % — ABNORMAL HIGH (ref 4.8–5.6)
Mean Plasma Glucose: 120 mg/dL

## 2023-01-25 MED ORDER — CALCITRIOL 0.25 MCG PO CAPS: 0.2500 ug | ORAL_CAPSULE | Freq: Every day | ORAL | 1 refills | Status: DC

## 2023-01-25 MED ORDER — VITAMIN D (ERGOCALCIFEROL) 1.25 MG (50000 UNIT) PO CAPS: 50000.0000 [IU] | ORAL_CAPSULE | ORAL | 0 refills | Status: DC

## 2023-01-25 NOTE — Progress Notes (Signed)
 Established Patient Office Visit  Subjective   Patient ID: [name], female    DOB: 1980/02/04  Age: 43 y.o. MRN: [id number]  Chief Complaint  Patient presents with   Transitions Of Care    Hfu     HPI  This is a 43 year old female living with a history stated below and presents today for hospital follow-up. Please see problem based assessment and plan for additional details.  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   Depression 11/20/2010   Asthma 11/20/2010   Type 2 diabetes mellitus (HCC) 11/07/2010   Past Medical History:  Diagnosis Date   Anxiety    Asthma    Depression    Diabetes mellitus    Hyperemesis arising during pregnancy    Hypertension    Mental disorder    Past Surgical History:  Procedure Laterality Date   ESOPHAGOGASTRODUODENOSCOPY (EGD) WITH PROPOFOL  N/A 08/07/2019   Procedure: ESOPHAGOGASTRODUODENOSCOPY (EGD) WITH PROPOFOL ;  Surgeon: Abran Norleen SAILOR, MD;  Location: [facility] ENDOSCOPY;  Service: Endoscopy;  Laterality: N/A;   NO PAST SURGERIES     TUBAL LIGATION  06/20/2011   Procedure: POST PARTUM TUBAL LIGATION;  Surgeon: Winton Felt, MD;  Location: [facility] ORS;  Service: Gynecology;  Laterality: Bilateral;  Bilateral post partum tubal ligation   Social History   Tobacco Use   Smoking status: Never   Smokeless tobacco: Never  Vaping Use   Vaping status: Never Used  Substance Use Topics   Alcohol use: No    Drug use: Yes    Types: Marijuana   Social History   Socioeconomic History   Marital status: Single    Spouse name: Not on file   Number of children: 2   Years of education: Not on file   Highest education level: Not on file  Occupational History   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    Comment: [employer]  Tobacco Use   Smoking status: Never   Smokeless tobacco: Never  Vaping Use   Vaping status: Never Used  Substance and Sexual Activity   Alcohol use: No   Drug use: Yes    Types: Marijuana   Sexual activity: Yes    Birth control/protection: None  Other Topics Concern   Not on file  Social History Narrative   Not on file   Social Drivers of Health   Financial Resource Strain: Not on file  Food Insecurity: No Food Insecurity (12/27/2022)   Hunger Vital Sign    Worried About Running Out of Food in the Last Year: Never true    Ran Out of Food in the Last Year: Never true  Transportation Needs: No Transportation Needs (12/28/2022)   PRAPARE - Administrator, Civil Service (Medical): No    Lack of Transportation (Non-Medical): No  Physical Activity: Not on file  Stress: Not on file  Social Connections: Not on file  Intimate Partner Violence: Not At Risk (12/28/2022)  Humiliation, Afraid, Rape, and Kick questionnaire    Fear of Current or Ex-Partner: No    Emotionally Abused: No    Physically Abused: No    Sexually Abused: No   Family Status  Relation Name Status   Mother  (Not Specified)   Sister  (Not Specified)   Neg Hx  (Not Specified)  No partnership data on file   Family History  Problem Relation Age of Onset   Heart disease Mother        CHF   Diabetes Sister    Anesthesia problems Neg Hx    Allergies  Allergen Reactions   Shellfish Allergy Anaphylaxis and Swelling   Morphine  And Codeine Nausea And Vomiting    ROS   ROS negative except for what is noted on the assessment and plan Objective:     BP 119/62 (BP Location:  Left Arm, Cuff Size: Normal)   Pulse 67   Temp 98.1 F (36.7 C) (Oral)   Ht 5' 7 (1.702 m)   Wt 167 lb 6.4 oz (75.9 kg)   LMP 12/05/2022   SpO2 100%   BMI 26.22 kg/m  BP Readings from Last 3 Encounters:  01/25/23 119/62  01/24/23 (!) 162/93  01/22/23 (!) 183/95   Wt Readings from Last 3 Encounters:  01/25/23 167 lb 6.4 oz (75.9 kg)  01/23/23 175 lb 15.9 oz (79.8 kg)  01/21/23 176 lb (79.8 kg)   SpO2 Readings from Last 3 Encounters:  01/25/23 100%  01/24/23 100%  01/22/23 98%      Physical Exam  General: Patient is sitting in chair, no acute distress Head: Normocephalic, atraumatic  Cardio: Regular rate and rhythm, no murmurs, rubs or gallops. 2+ pulses to bilateral upper and lower extremities  Pulmonary: Clear to ausculation bilaterally with no rales, rhonchi, and crackles  Abdomen: Soft, nontender with normoactive bowel sounds with no rebound or guarding  MSK: 5/5 strength to upper and lower extremities.   Neuro: Alert and oriented x3, no focal deficits   Results for orders placed or performed in visit on 01/25/23  Renal function panel  Result Value Ref Range   Sodium 132 (L) 135 - 145 mmol/L   Potassium 4.0 3.5 - 5.1 mmol/L   Chloride 103 98 - 111 mmol/L   CO2 15 (L) 22 - 32 mmol/L   Glucose, Bld 165 (H) 70 - 99 mg/dL   BUN 70 (H) 6 - 20 mg/dL   Creatinine, Ser 2.68 (H) 0.44 - 1.00 mg/dL   Calcium  8.1 (L) 8.9 - 10.3 mg/dL   Phosphorus 3.4 2.5 - 4.6 mg/dL   Albumin 3.5 3.5 - 5.0 g/dL   GFR, Estimated 7 (L) >60 mL/min   Anion gap 14 5 - 15    Last CBC Lab Results  Component Value Date   WBC 9.7 01/23/2023   HGB 10.9 (L) 01/23/2023   HCT 33.6 (L) 01/23/2023   MCV 76.5 (L) 01/23/2023   MCH 24.8 (L) 01/23/2023   RDW 16.0 (H) 01/23/2023   PLT 265 01/23/2023   Last metabolic panel Lab Results  Component Value Date   GLUCOSE 165 (H) 01/25/2023   NA 132 (L) 01/25/2023   K 4.0 01/25/2023   CL 103 01/25/2023   CO2 15 (L) 01/25/2023   BUN 70 (H)  01/25/2023   CREATININE 7.31 (H) 01/25/2023   GFRNONAA 7 (L) 01/25/2023   CALCIUM  8.1 (L) 01/25/2023   PHOS 3.4 01/25/2023   PROT 7.1 01/23/2023   ALBUMIN 3.5 01/25/2023  LABGLOB 3.7 08/31/2022   AGRATIO 1.0 08/31/2022   BILITOT 0.9 01/23/2023   ALKPHOS 67 01/23/2023   AST 15 01/23/2023   ALT 11 01/23/2023   ANIONGAP 14 01/25/2023   Last lipids Lab Results  Component Value Date   CHOL 111 06/21/2010   HDL 51 06/21/2010   LDLCALC  06/21/2010    40        Total Cholesterol/HDL:CHD Risk Coronary Heart Disease Risk Table                     Men   Women  1/2 Average Risk   3.4   3.3  Average Risk       5.0   4.4  2 X Average Risk   9.6   7.1  3 X Average Risk  23.4   11.0        Use the calculated Patient Ratio above and the CHD Risk Table to determine the patient's CHD Risk.        ATP III CLASSIFICATION (LDL):  <100     mg/dL   Optimal  899-870  mg/dL   Near or Above                    Optimal  130-159  mg/dL   Borderline  839-810  mg/dL   High  >809     mg/dL   Very High   TRIG 98 93/96/7987   CHOLHDL 2.2 06/21/2010   Last hemoglobin A1c Lab Results  Component Value Date   HGBA1C 5.8 (H) 01/24/2023   Last thyroid functions Lab Results  Component Value Date   TSH 1.595 09/23/2020   Last vitamin D  Lab Results  Component Value Date   VD25OH 10.44 (L) 09/30/2022   Last vitamin B12 and Folate Lab Results  Component Value Date   VITAMINB12 309 09/30/2022   FOLATE 4.6 (L) 09/30/2022      The ASCVD Risk score (Arnett DK, et al., 2019) failed to calculate for the following reasons:   Cannot find a previous HDL lab   Cannot find a previous total cholesterol lab    Assessment & Plan:  Patient discussed with Dr. Forest   Problem List Items Addressed This Visit       Cardiovascular and Mediastinum   Hypertension   BP Readings from Last 3 Encounters:  01/25/23 119/62  01/24/23 (!) 162/93  01/22/23 (!) 183/95    Patient's current medications  include amlodipine  10 mg daily, hydralazine  3 times daily, Toprol  25 mg daily, Imdur  60 mg daily. Patient reports that she took her medications this morning.  No other concerns at this time.  Plan: -Continue taking amlodipine  10 mg daily, hydralazine  3 times daily, Toprol  25 mg daily, Imdur  60 mg daily.        Digestive   Intractable nausea and vomiting   Patient reports that her nausea and vomiting started last week Friday, where she had multiple episodes of nausea and vomiting.  She has been seen in the ED 3 times, she went to ED twice on January 3rd and once on January 5th.  She was admitted on 1/5, however patient reports that she left the emergency department because she did not wanted to wait in the ED until her room was to be available for her.  Presently patient reports that she is overall doing great.  She denies any nausea vomiting or abdominal pain.  She reports normal bowel movements.  He denies any hematochezia  or melena.  Denies any fever or chills.  Patient reports that her symptoms has resolved.  However looking at the labs that were drawn in the ED, Creatinine 5.74, BUN 59, GFR 9; patient's BUN had worsened as compared to previous lab test.  There is a concern uremia in the setting of advanced CKD.  Patient is counseled that if she does have worsening nausea or vomiting or altered mental status, she should go to ED to be reevaluated.        Genitourinary   CKD (chronic kidney disease), stage V (HCC) - Primary   Acute renal failure superimposed on stage 5 chronic kidney disease, not on chronic dialysis, unspecified acute renal failure type [facility])   Patient was recently admitted on 12/27/2022 for acute on chronic renal failure and uncontrolled hypertension.  She was discharged on 12/29/22 for f/u with nephrology.  Patient was evaluated in the ED on 1/5 for patient was admitted however patient left AMA.  Labs that were drawn on 1/5 showed Creatinine 5.74, BUN 59, GFR 9; patient's BUN  had worsened as compared to previous lab test which raises a concern for uremia as to CKD.  Patient reports that she does have with her nephrologist on 01/27/2022 with Dr. Norine.   Plan: - Check RFP today  - If BUN/Bicarb worsens, patient should be instructed to increase sodium bicarb 3 times daily.      Relevant Medications   Vitamin D , Ergocalciferol , (DRISDOL ) 1.25 MG (50000 UNIT) CAPS capsule (Start on 01/31/2023)   calcitRIOL  (ROCALTROL ) 0.25 MCG capsule   Other Relevant Orders   Renal function panel (Completed)    Return in about 3 months (around 04/25/2023) for Routine .    Toma Edwards, DO

## 2023-01-25 NOTE — Patient Instructions (Signed)
 Thank you, [name] for allowing us  to provide your care today. Today we discussed .:  If you start having any more nausea, vomiting, metallic taste in the back of your mouth, or feeling altered, go back to ED to be reevaluated.  Please continue the medications as prescribed.  Follow-up with your nephrologist on December 28, 2022.  I will recheck your kidneys today and, will call you for results.  I have ordered the following labs for you:  Lab Orders         Renal function panel      Tests ordered today:  RFP  Referrals ordered today:   Referral Orders  No referral(s) requested today     I have ordered the following medication/changed the following medications:   Stop the following medications: Medications Discontinued During This Encounter  Medication Reason   calcitRIOL  (ROCALTROL ) 0.25 MCG capsule Reorder   Vitamin D , Ergocalciferol , (DRISDOL ) 1.25 MG (50000 UNIT) CAPS capsule Reorder     Start the following medications: Meds ordered this encounter  Medications   Vitamin D , Ergocalciferol , (DRISDOL ) 1.25 MG (50000 UNIT) CAPS capsule    Sig: Take 1 capsule (50,000 Units total) by mouth every Monday.    Dispense:  5 capsule    Refill:  0   calcitRIOL  (ROCALTROL ) 0.25 MCG capsule    Sig: Take 1 capsule (0.25 mcg total) by mouth daily.    Dispense:  30 capsule    Refill:  1     Follow up:  3 months routine      Remember:   Should you have any questions or concerns please call the internal medicine clinic at [phone].     Olivia Edwards, DO [facility]

## 2023-01-26 NOTE — Assessment & Plan Note (Addendum)
 Patient was recently admitted on 12/27/2022 for acute on chronic renal failure and uncontrolled hypertension.  She was discharged on 12/29/22 for f/u with nephrology.  Patient was evaluated in the ED on 1/5 for patient was admitted however patient left AMA.  Labs that were drawn on 1/5 showed Creatinine 5.74, BUN 59, GFR 9; patient's BUN had worsened as compared to previous lab test which raises a concern for uremia as to CKD.  Patient reports that she does have with her nephrologist on 01/27/2022 with Dr. Norine.   Plan: - Check RFP today  - If BUN/Bicarb worsens, patient should be instructed to increase sodium bicarb 3 times daily.

## 2023-01-26 NOTE — Assessment & Plan Note (Signed)
 BP Readings from Last 3 Encounters:  01/25/23 119/62  01/24/23 (!) 162/93  01/22/23 (!) 183/95    Patient's current medications include amlodipine  10 mg daily, hydralazine  3 times daily, Toprol  25 mg daily, Imdur  60 mg daily. Patient reports that she took her medications this morning.  No other concerns at this time.  Plan: -Continue taking amlodipine  10 mg daily, hydralazine  3 times daily, Toprol  25 mg daily, Imdur  60 mg daily.

## 2023-01-26 NOTE — Assessment & Plan Note (Signed)
 Patient reports that her nausea and vomiting started last week Friday, where she had multiple episodes of nausea and vomiting.  She has been seen in the ED 3 times, she went to ED twice on January 3rd and once on January 5th.  She was admitted on 1/5, however patient reports that she left the emergency department because she did not wanted to wait in the ED until her room was to be available for her.  Presently patient reports that she is overall doing great.  She denies any nausea vomiting or abdominal pain.  She reports normal bowel movements.  He denies any hematochezia or melena.  Denies any fever or chills.  Patient reports that her symptoms has resolved.  However looking at the labs that were drawn in the ED, Creatinine 5.74, BUN 59, GFR 9; patient's BUN had worsened as compared to previous lab test.  There is a concern uremia in the setting of advanced CKD.  Patient is counseled that if she does have worsening nausea or vomiting or altered mental status, she should go to ED to be reevaluated.

## 2023-01-28 DIAGNOSIS — I120 Hypertensive chronic kidney disease with stage 5 chronic kidney disease or end stage renal disease: Secondary | ICD-10-CM | POA: Diagnosis not present

## 2023-01-28 DIAGNOSIS — N2581 Secondary hyperparathyroidism of renal origin: Secondary | ICD-10-CM | POA: Diagnosis not present

## 2023-01-28 DIAGNOSIS — N185 Chronic kidney disease, stage 5: Secondary | ICD-10-CM | POA: Diagnosis not present

## 2023-01-28 DIAGNOSIS — D631 Anemia in chronic kidney disease: Secondary | ICD-10-CM | POA: Diagnosis not present

## 2023-01-28 DIAGNOSIS — E1122 Type 2 diabetes mellitus with diabetic chronic kidney disease: Secondary | ICD-10-CM | POA: Diagnosis not present

## 2023-02-04 NOTE — Progress Notes (Signed)
 Internal Medicine Clinic Attending  Case discussed with the resident at the time of the visit.  We reviewed the resident's history and exam and pertinent patient test results.  I agree with the assessment, diagnosis, and plan of care documented in the resident's note.

## 2023-03-01 DIAGNOSIS — E1122 Type 2 diabetes mellitus with diabetic chronic kidney disease: Secondary | ICD-10-CM | POA: Diagnosis not present

## 2023-03-01 DIAGNOSIS — N189 Chronic kidney disease, unspecified: Secondary | ICD-10-CM | POA: Diagnosis not present

## 2023-03-01 DIAGNOSIS — I120 Hypertensive chronic kidney disease with stage 5 chronic kidney disease or end stage renal disease: Secondary | ICD-10-CM | POA: Diagnosis not present

## 2023-03-01 DIAGNOSIS — N2581 Secondary hyperparathyroidism of renal origin: Secondary | ICD-10-CM | POA: Diagnosis not present

## 2023-03-01 DIAGNOSIS — N185 Chronic kidney disease, stage 5: Secondary | ICD-10-CM | POA: Diagnosis not present

## 2023-03-01 DIAGNOSIS — D631 Anemia in chronic kidney disease: Secondary | ICD-10-CM | POA: Diagnosis not present

## 2023-03-16 ENCOUNTER — Other Ambulatory Visit (HOSPITAL_COMMUNITY): Payer: Self-pay | Admitting: *Deleted

## 2023-03-18 ENCOUNTER — Encounter (HOSPITAL_COMMUNITY): Payer: Medicaid Other

## 2023-03-25 ENCOUNTER — Encounter (HOSPITAL_COMMUNITY): Payer: Medicaid Other

## 2023-03-30 ENCOUNTER — Other Ambulatory Visit: Payer: Self-pay

## 2023-03-30 DIAGNOSIS — N185 Chronic kidney disease, stage 5: Secondary | ICD-10-CM

## 2023-04-05 NOTE — Progress Notes (Deleted)
 Office Note     CC:  ESRD Requesting Provider:  Tyler Pita, MD  HPI: [name] is a {Handed:[id number]} handed 43 y.o. (October 09, 1980) female with kidney disease who presents at the request of Tyler Pita, MD for permanent HD access. The patient has had *** prior access procedures. Per pt, previous tunneled lines have been placed in ***. Current access is ***. Dialysis days are ***.   On exam, ***  The pt is *** on a statin for cholesterol management.  The pt is *** on a daily aspirin.   Other AC:  *** The pt is *** on medications for hypertension.   The pt is *** diabetic. Tobacco hx:  ***  Past Medical History:  Diagnosis Date   Anxiety    Asthma    Depression    Diabetes mellitus    Hyperemesis arising during pregnancy    Hypertension    Mental disorder     Past Surgical History:  Procedure Laterality Date   ESOPHAGOGASTRODUODENOSCOPY (EGD) WITH PROPOFOL N/A 08/07/2019   Procedure: ESOPHAGOGASTRODUODENOSCOPY (EGD) WITH PROPOFOL;  Surgeon: Hilarie Fredrickson, MD;  Location: [facility] ENDOSCOPY;  Service: Endoscopy;  Laterality: N/A;   NO PAST SURGERIES     TUBAL LIGATION  06/20/2011   Procedure: POST PARTUM TUBAL LIGATION;  Surgeon: Catalina Antigua, MD;  Location: [facility] ORS;  Service: Gynecology;  Laterality: Bilateral;  Bilateral post partum tubal ligation    Social History   Socioeconomic History   Marital status: Single    Spouse name: Not on file   Number of children: 2   Years of education: Not on file   Highest education level: Not on file  Occupational History   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    Comment: [employer]  Tobacco Use   Smoking status: Never   Smokeless tobacco: Never  Vaping Use   Vaping status: Never Used  Substance and Sexual Activity   Alcohol use: No   Drug use: Yes    Types: Marijuana   Sexual activity: Yes    Birth control/protection: None  Other Topics Concern   Not on file  Social History Narrative   Not on file   Social  Drivers of Health   Financial Resource Strain: Not on file  Food Insecurity: No Food Insecurity (12/27/2022)   Hunger Vital Sign    Worried About Running Out of Food in the Last Year: Never true    Ran Out of Food in the Last Year: Never true  Transportation Needs: No Transportation Needs (12/28/2022)   PRAPARE - Administrator, Civil Service (Medical): No    Lack of Transportation (Non-Medical): No  Physical Activity: Not on file  Stress: Not on file  Social Connections: Not on file  Intimate Partner Violence: Not At Risk (12/28/2022)   Humiliation, Afraid, Rape, and Kick questionnaire    Fear of Current or Ex-Partner: No    Emotionally Abused: No    Physically Abused: No    Sexually Abused: No   *** Family History  Problem Relation Age of Onset   Heart disease Mother        CHF   Diabetes Sister    Anesthesia problems Neg Hx     Current Outpatient Medications  Medication Sig Dispense Refill   amLODipine (NORVASC) 10 MG tablet Take 10 mg by mouth daily.     calcitRIOL (ROCALTROL) 0.25 MCG capsule Take 1 capsule (0.25 mcg total) by mouth daily. 30 capsule 1  calcium carbonate (TUMS - DOSED IN MG ELEMENTAL CALCIUM) 500 MG chewable tablet Chew 1 tablet (200 mg of elemental calcium total) by mouth 3 (three) times daily between meals. (Patient taking differently: Chew 500 mg by mouth 3 (three) times daily with meals as needed for indigestion or heartburn.) 150 tablet 1   dicyclomine (BENTYL) 20 MG tablet Take 1 tablet (20 mg total) by mouth 2 (two) times daily. (Patient taking differently: Take 20 mg by mouth 2 (two) times daily as needed for spasms.) 20 tablet 0   ferrous sulfate 325 (65 FE) MG tablet Take 1 tablet (325 mg total) by mouth daily. 100 tablet 1   folic acid (FOLVITE) 1 MG tablet Take 1 tablet (1 mg total) by mouth daily. 30 tablet 1   hydrALAZINE (APRESOLINE) 100 MG tablet Take 1 tablet (100 mg total) by mouth 3 (three) times daily. 90 tablet 0    isosorbide mononitrate (IMDUR) 60 MG 24 hr tablet Take 1 tablet (60 mg total) by mouth daily. 30 tablet 0   metoprolol succinate (TOPROL-XL) 50 MG 24 hr tablet Take 1.5 tablets (75 mg total) by mouth daily. Take with or immediately following a meal. 30 tablet 1   ondansetron (ZOFRAN-ODT) 4 MG disintegrating tablet Take 1 tablet (4 mg total) by mouth every 6 (six) hours as needed for nausea or vomiting. 20 tablet 0   pantoprazole (PROTONIX) 40 MG tablet Take 1 tablet (40 mg total) by mouth daily. 30 tablet 1   sodium bicarbonate 650 MG tablet Take 2 tablets (1,300 mg total) by mouth 2 (two) times daily. 100 tablet 1   Vitamin D, Ergocalciferol, (DRISDOL) 1.25 MG (50000 UNIT) CAPS capsule Take 1 capsule (50,000 Units total) by mouth every Monday. 5 capsule 0   No current facility-administered medications for this visit.    Allergies  Allergen Reactions   Shellfish Allergy Anaphylaxis and Swelling   Morphine And Codeine Nausea And Vomiting     REVIEW OF SYSTEMS:  *** [X]  denotes positive finding, [ ]  denotes negative finding Cardiac  Comments:  Chest pain or chest pressure:    Shortness of breath upon exertion:    Short of breath when lying flat:    Irregular heart rhythm:        Vascular    Pain in calf, thigh, or hip brought on by ambulation:    Pain in feet at night that wakes you up from your sleep:     Blood clot in your veins:    Leg swelling:         Pulmonary    Oxygen at home:    Productive cough:     Wheezing:         Neurologic    Sudden weakness in arms or legs:     Sudden numbness in arms or legs:     Sudden onset of difficulty speaking or slurred speech:    Temporary loss of vision in one eye:     Problems with dizziness:         Gastrointestinal    Blood in stool:     Vomited blood:         Genitourinary    Burning when urinating:     Blood in urine:        Psychiatric    Major depression:         Hematologic    Bleeding problems:    Problems with  blood clotting too easily:  Skin    Rashes or ulcers:        Constitutional    Fever or chills:      PHYSICAL EXAMINATION:  There were no vitals filed for this visit.  General:  WDWN in NAD; vital signs documented above Gait: Not observed HENT: WNL, normocephalic Pulmonary: normal non-labored breathing , without Rales, rhonchi,  wheezing Cardiac: {Desc; regular/irreg:14544} HR, without  Murmurs {With/Without:20273} carotid bruit*** Abdomen: soft, NT, no masses Skin: {With/Without:20273} rashes Vascular Exam/Pulses:  Right Left  Radial {Exam; arterial pulse strength 0-4:30167} {Exam; arterial pulse strength 0-4:30167}  Ulnar {Exam; arterial pulse strength 0-4:30167} {Exam; arterial pulse strength 0-4:30167}  Femoral {Exam; arterial pulse strength 0-4:30167} {Exam; arterial pulse strength 0-4:30167}  Popliteal {Exam; arterial pulse strength 0-4:30167} {Exam; arterial pulse strength 0-4:30167}  DP {Exam; arterial pulse strength 0-4:30167} {Exam; arterial pulse strength 0-4:30167}  PT {Exam; arterial pulse strength 0-4:30167} {Exam; arterial pulse strength 0-4:30167}   Extremities: {With/Without:20273} ischemic changes, {With/Without:20273} Gangrene , {With/Without:20273} cellulitis; {With/Without:20273} open wounds;  Musculoskeletal: no muscle wasting or atrophy  Neurologic: A&O X 3;  No focal weakness or paresthesias are detected Psychiatric:  The pt has {Desc; normal/abnormal:11317::"Normal"} affect.   Non-Invasive Vascular Imaging:   ***    ASSESSMENT/PLAN:  [name] is a 43 y.o. female who presents with {KidneyDisease:19197::"end stage renal disease","chronic kidney disease stage ***"}  Based on vein mapping and examination, ***. I had an extensive discussion with this patient in regards to the nature of access surgery, including risk, benefits, and alternatives.   The patient is aware that the risks of access surgery include but are not limited to: bleeding,  infection, steal syndrome, nerve damage, ischemic monomelic neuropathy, failure of access to mature, complications related to venous hypertension, and possible need for additional access procedures in the future. *** I discussed with the patient the nature of the staged access procedure, specifically the need for a second operation to transpose the first stage fistula if it matures adequately.   The patient has *** agreed to proceed with the above procedure which will be scheduled ***.  Victorino Sparrow, MD [facility] [phone]

## 2023-04-07 ENCOUNTER — Ambulatory Visit (HOSPITAL_COMMUNITY): Admission: RE | Admit: 2023-04-07 | Payer: Medicaid Other | Source: Ambulatory Visit

## 2023-04-07 ENCOUNTER — Ambulatory Visit (HOSPITAL_COMMUNITY): Payer: Medicaid Other | Attending: Vascular Surgery

## 2023-04-07 ENCOUNTER — Encounter: Payer: Medicaid Other | Admitting: Vascular Surgery

## 2023-05-24 DIAGNOSIS — E1122 Type 2 diabetes mellitus with diabetic chronic kidney disease: Secondary | ICD-10-CM | POA: Diagnosis not present

## 2023-05-24 DIAGNOSIS — N189 Chronic kidney disease, unspecified: Secondary | ICD-10-CM | POA: Diagnosis not present

## 2023-05-24 DIAGNOSIS — N185 Chronic kidney disease, stage 5: Secondary | ICD-10-CM | POA: Diagnosis not present

## 2023-05-24 DIAGNOSIS — D631 Anemia in chronic kidney disease: Secondary | ICD-10-CM | POA: Diagnosis not present

## 2023-05-24 DIAGNOSIS — I120 Hypertensive chronic kidney disease with stage 5 chronic kidney disease or end stage renal disease: Secondary | ICD-10-CM | POA: Diagnosis not present

## 2023-05-24 DIAGNOSIS — N2581 Secondary hyperparathyroidism of renal origin: Secondary | ICD-10-CM | POA: Diagnosis not present

## 2023-06-06 ENCOUNTER — Ambulatory Visit (HOSPITAL_COMMUNITY)
Admission: RE | Admit: 2023-06-06 | Discharge: 2023-06-06 | Disposition: A | Discharge status: Home / Self Care | Source: Ambulatory Visit | Attending: Internal Medicine | Admitting: Internal Medicine

## 2023-06-07 NOTE — Progress Notes (Signed)
 Office Note     CC:  [id number] Requesting Provider:  Baron Border, MD  HPI: [name] is a Right handed 43 y.o. (28-May-1980) female with kidney disease who presents at the request of Baron Border, MD for permanent HD access. The patient has had no prior access procedures.  No previous tunneled lines  On exam, [name] was doing well, accompanied by her son.  A native of [place], she has lived her entire life.  She is a Patent examiner by trade.  She continues to work.  She is aware that her kidneys are failing slowly, and that she will need dialysis in the future, however unsure as to when that will be.  Past Medical History:  Diagnosis Date   Anxiety    Asthma    Depression    Diabetes mellitus    Hyperemesis arising during pregnancy    Hypertension    Mental disorder     Past Surgical History:  Procedure Laterality Date   ESOPHAGOGASTRODUODENOSCOPY (EGD) WITH PROPOFOL  N/A 08/07/2019   Procedure: ESOPHAGOGASTRODUODENOSCOPY (EGD) WITH PROPOFOL ;  Surgeon: Tobin Forts, MD;  Location: [facility] ENDOSCOPY;  Service: Endoscopy;  Laterality: N/A;   NO PAST SURGERIES     TUBAL LIGATION  06/20/2011   Procedure: POST PARTUM TUBAL LIGATION;  Surgeon: Verlyn Goad, MD;  Location: [facility] ORS;  Service: Gynecology;  Laterality: Bilateral;  Bilateral post partum tubal ligation    Social History   Socioeconomic History   Marital status: Single    Spouse name: Not on file   Number of children: 2   Years of education: Not on file   Highest education level: Not on file  Occupational History   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    Comment: [employer]  Tobacco Use   Smoking status: Never   Smokeless tobacco: Never  Vaping Use   Vaping status: Never Used  Substance and Sexual Activity   Alcohol use: No   Drug use: Yes    Types: Marijuana   Sexual activity: Yes    Birth control/protection: None  Other Topics Concern   Not on file  Social History Narrative   Not on file    Social Drivers of Health   Financial Resource Strain: Not on file  Food Insecurity: No Food Insecurity (12/27/2022)   Hunger Vital Sign    Worried About Running Out of Food in the Last Year: Never true    Ran Out of Food in the Last Year: Never true  Transportation Needs: No Transportation Needs (12/28/2022)   PRAPARE - Administrator, Civil Service (Medical): No    Lack of Transportation (Non-Medical): No  Physical Activity: Not on file  Stress: Not on file  Social Connections: Not on file  Intimate Partner Violence: Not At Risk (12/28/2022)   Humiliation, Afraid, Rape, and Kick questionnaire    Fear of Current or Ex-Partner: No    Emotionally Abused: No    Physically Abused: No    Sexually Abused: No   Family History  Problem Relation Age of Onset   Heart disease Mother        CHF   Diabetes Sister    Anesthesia problems Neg Hx     Current Outpatient Medications  Medication Sig Dispense Refill   amLODipine  (NORVASC ) 10 MG tablet Take 10 mg by mouth daily.     calcitRIOL  (ROCALTROL ) 0.25 MCG capsule Take 1 capsule (0.25 mcg total) by mouth daily. 30 capsule 1  calcium  carbonate (TUMS - DOSED IN MG ELEMENTAL CALCIUM ) 500 MG chewable tablet Chew 1 tablet (200 mg of elemental calcium  total) by mouth 3 (three) times daily between meals. (Patient taking differently: Chew 500 mg by mouth 3 (three) times daily with meals as needed for indigestion or heartburn.) 150 tablet 1   dicyclomine  (BENTYL ) 20 MG tablet Take 1 tablet (20 mg total) by mouth 2 (two) times daily. (Patient taking differently: Take 20 mg by mouth 2 (two) times daily as needed for spasms.) 20 tablet 0   ferrous sulfate  325 (65 FE) MG tablet Take 1 tablet (325 mg total) by mouth daily. 100 tablet 1   folic acid  (FOLVITE ) 1 MG tablet Take 1 tablet (1 mg total) by mouth daily. 30 tablet 1   hydrALAZINE  (APRESOLINE ) 100 MG tablet Take 1 tablet (100 mg total) by mouth 3 (three) times daily. 90 tablet 0    isosorbide  mononitrate (IMDUR ) 60 MG 24 hr tablet Take 1 tablet (60 mg total) by mouth daily. 30 tablet 0   metoprolol  succinate (TOPROL -XL) 50 MG 24 hr tablet Take 1.5 tablets (75 mg total) by mouth daily. Take with or immediately following a meal. 30 tablet 1   ondansetron  (ZOFRAN -ODT) 4 MG disintegrating tablet Take 1 tablet (4 mg total) by mouth every 6 (six) hours as needed for nausea or vomiting. 20 tablet 0   pantoprazole  (PROTONIX ) 40 MG tablet Take 1 tablet (40 mg total) by mouth daily. 30 tablet 1   sodium bicarbonate  650 MG tablet Take 2 tablets (1,300 mg total) by mouth 2 (two) times daily. 100 tablet 1   Vitamin D , Ergocalciferol , (DRISDOL ) 1.25 MG (50000 UNIT) CAPS capsule Take 1 capsule (50,000 Units total) by mouth every Monday. 5 capsule 0   No current facility-administered medications for this visit.    Allergies  Allergen Reactions   Shellfish Allergy Anaphylaxis and Swelling   Morphine  And Codeine Nausea And Vomiting     REVIEW OF SYSTEMS:  [X]  denotes positive finding, [ ]  denotes negative finding Cardiac  Comments:  Chest pain or chest pressure:    Shortness of breath upon exertion:    Short of breath when lying flat:    Irregular heart rhythm:        Vascular    Pain in calf, thigh, or hip brought on by ambulation:    Pain in feet at night that wakes you up from your sleep:     Blood clot in your veins:    Leg swelling:         Pulmonary    Oxygen at home:    Productive cough:     Wheezing:         Neurologic    Sudden weakness in arms or legs:     Sudden numbness in arms or legs:     Sudden onset of difficulty speaking or slurred speech:    Temporary loss of vision in one eye:     Problems with dizziness:         Gastrointestinal    Blood in stool:     Vomited blood:         Genitourinary    Burning when urinating:     Blood in urine:        Psychiatric    Major depression:         Hematologic    Bleeding problems:    Problems with blood  clotting too easily:  Skin    Rashes or ulcers:        Constitutional    Fever or chills:      PHYSICAL EXAMINATION:  There were no vitals filed for this visit.  General:  WDWN in NAD; vital signs documented above Gait: Not observed HENT: WNL, normocephalic Pulmonary: normal non-labored breathing , without Rales, rhonchi,  wheezing Cardiac: regular HR Abdomen: soft, NT, no masses Skin: without rashes Vascular Exam/Pulses:  Right Left  Radial 2+ (normal) 2+ (normal)  Ulnar    Femoral    Popliteal    DP    PT     Extremities: without ischemic changes, without Gangrene , without cellulitis; without open wounds;  Musculoskeletal: no muscle wasting or atrophy  Neurologic: A&O X 3;  No focal weakness or paresthesias are detected Psychiatric:  The pt has Normal affect.   Non-Invasive Vascular Imaging:   +-----------------+-------------+----------+--------------+  Right Cephalic   Diameter (cm)Depth (cm)   Findings     +-----------------+-------------+----------+--------------+  Shoulder                               not visualized  +-----------------+-------------+----------+--------------+  Prox upper arm                          not visualized  +-----------------+-------------+----------+--------------+  Mid upper arm                           not visualized  +-----------------+-------------+----------+--------------+  Dist upper arm                          not visualized  +-----------------+-------------+----------+--------------+  Antecubital fossa                       not visualized  +-----------------+-------------+----------+--------------+  Prox forearm                            not visualized  +-----------------+-------------+----------+--------------+  Mid forearm                             not visualized  +-----------------+-------------+----------+--------------+  Dist forearm                             not visualized  +-----------------+-------------+----------+--------------+   +-----------------+-------------+----------+--------------+  Right Basilic    Diameter (cm)Depth (cm)   Findings     +-----------------+-------------+----------+--------------+  Shoulder                               not visualized  +-----------------+-------------+----------+--------------+  Prox upper arm                          not visualized  +-----------------+-------------+----------+--------------+  Mid upper arm                           not visualized  +-----------------+-------------+----------+--------------+  Dist upper arm       0.22        1.34                   +-----------------+-------------+----------+--------------+  Antecubital fossa    0.21        1.04                   +-----------------+-------------+----------+--------------+  Prox forearm         0.11        0.22                   +-----------------+-------------+----------+--------------+  Mid forearm          0.10        0.24                   +-----------------+-------------+----------+--------------+   +-----------------+-------------+----------+--------------+  Left Cephalic    Diameter (cm)Depth (cm)   Findings     +-----------------+-------------+----------+--------------+  Shoulder                               not visualized  +-----------------+-------------+----------+--------------+  Prox upper arm                          not visualized  +-----------------+-------------+----------+--------------+  Mid upper arm                           not visualized  +-----------------+-------------+----------+--------------+  Dist upper arm                          not visualized  +-----------------+-------------+----------+--------------+  Antecubital fossa    0.10        0.45                   +-----------------+-------------+----------+--------------+  Prox  forearm                            not visualized  +-----------------+-------------+----------+--------------+  Mid forearm                             not visualized  +-----------------+-------------+----------+--------------+  Dist forearm                            not visualized  +-----------------+-------------+----------+--------------+   +-----------------+-------------+----------+--------------+  Left Basilic     Diameter (cm)Depth (cm)   Findings     +-----------------+-------------+----------+--------------+  Shoulder            0.18        1.23                   +-----------------+-------------+----------+--------------+  Prox upper arm       0.22        1.28                   +-----------------+-------------+----------+--------------+  Mid upper arm        0.20        0.88                   +-----------------+-------------+----------+--------------+  Dist upper arm       0.13        0.72                   +-----------------+-------------+----------+--------------+  Antecubital fossa    0.09        0.52                   +-----------------+-------------+----------+--------------+  Prox forearm                            not visualized  +-----------------+-------------+----------+--------------+  Mid forearm                             not visualized  +-----------------+-------------+----------+--------------+  Distal forearm                          not visualized  +-----------------+-------------+----------+--------------+   *See table(s) above for measurements and observations.        ASSESSMENT/PLAN:  [name] is a 43 y.o. female who presents with chronic kidney disease stage 5  Based on vein mapping and examination, patient does not have usable superficial veins bilaterally.  She would require AV graft.   I had a long discussion with [name] regarding dialysis, as well is the need for AV graft creation  when ready.  At this time, I have been asked to hold on AV graft placement. [name] can follow-up with me as needed at this time.  I am happy to see her back in the office when she needs access.  Kayla Part, MD [facility] [phone] Total time of patient care including pre-visit research, consultation, and documentation greater than 30 minutes

## 2023-06-09 ENCOUNTER — Ambulatory Visit (HOSPITAL_COMMUNITY)
Admission: RE | Admit: 2023-06-09 | Discharge: 2023-06-09 | Disposition: A | Discharge status: Home / Self Care | Payer: Self-pay | Source: Ambulatory Visit | Attending: Vascular Surgery | Admitting: Vascular Surgery

## 2023-06-09 ENCOUNTER — Ambulatory Visit (INDEPENDENT_AMBULATORY_CARE_PROVIDER_SITE_OTHER): Admitting: Vascular Surgery

## 2023-06-09 ENCOUNTER — Encounter: Payer: Self-pay | Admitting: Vascular Surgery

## 2023-06-09 ENCOUNTER — Ambulatory Visit (HOSPITAL_BASED_OUTPATIENT_CLINIC_OR_DEPARTMENT_OTHER)
Admission: RE | Admit: 2023-06-09 | Discharge: 2023-06-09 | Disposition: A | Discharge status: Home / Self Care | Payer: Self-pay | Source: Ambulatory Visit | Attending: Vascular Surgery | Admitting: Vascular Surgery

## 2023-06-09 VITALS — BP 196/98 | HR 80 | Temp 97.8°F | Resp 20 | Ht 67.0 in | Wt 172.0 lb

## 2023-06-09 DIAGNOSIS — N185 Chronic kidney disease, stage 5: Secondary | ICD-10-CM | POA: Diagnosis not present

## 2023-06-14 ENCOUNTER — Encounter (HOSPITAL_COMMUNITY)

## 2023-06-15 ENCOUNTER — Encounter (HOSPITAL_COMMUNITY)
Admission: RE | Admit: 2023-06-15 | Discharge: 2023-06-15 | Disposition: A | Discharge status: Home / Self Care | Source: Ambulatory Visit | Attending: Internal Medicine | Admitting: Internal Medicine

## 2023-06-15 DIAGNOSIS — D631 Anemia in chronic kidney disease: Secondary | ICD-10-CM | POA: Insufficient documentation

## 2023-06-15 DIAGNOSIS — N189 Chronic kidney disease, unspecified: Secondary | ICD-10-CM | POA: Diagnosis present

## 2023-06-15 MED ORDER — SODIUM CHLORIDE 0.9 % IV SOLN
510.0000 mg | INTRAVENOUS | Status: DC
  Administered 2023-06-15: 510 mg via INTRAVENOUS
  Filled 2023-06-15: qty 510

## 2023-06-20 ENCOUNTER — Telehealth: Payer: Self-pay

## 2023-06-20 NOTE — Telephone Encounter (Signed)
 Auth Submission: NO AUTH NEEDED Site of care: Site of care: [facility] Payer: [place] Medicaid Medication & CPT/J Code(s) submitted: Feraheme  (ferumoxytol ) [id number] Route of submission (phone, fax, portal):  Phone # Fax # Auth type: Buy/Bill PB Units/visits requested: 510mg  x 2 doses Reference number:  Approval from: 06/15/23 to 08/15/23

## 2023-06-22 ENCOUNTER — Inpatient Hospital Stay (HOSPITAL_COMMUNITY): Admission: RE | Admit: 2023-06-22 | Source: Ambulatory Visit

## 2023-06-29 ENCOUNTER — Encounter (HOSPITAL_COMMUNITY)

## 2023-11-13 ENCOUNTER — Observation Stay (HOSPITAL_COMMUNITY)
Admission: EM | Admit: 2023-11-13 | Discharge: 2023-11-14 | Disposition: A | Discharge status: Home / Self Care | Attending: Internal Medicine | Admitting: Internal Medicine

## 2023-11-13 ENCOUNTER — Encounter (HOSPITAL_COMMUNITY): Payer: Self-pay | Admitting: Emergency Medicine

## 2023-11-13 ENCOUNTER — Emergency Department (HOSPITAL_COMMUNITY)

## 2023-11-13 DIAGNOSIS — I10 Essential (primary) hypertension: Secondary | ICD-10-CM | POA: Diagnosis not present

## 2023-11-13 DIAGNOSIS — R110 Nausea: Secondary | ICD-10-CM | POA: Diagnosis not present

## 2023-11-13 DIAGNOSIS — N189 Chronic kidney disease, unspecified: Secondary | ICD-10-CM | POA: Diagnosis not present

## 2023-11-13 DIAGNOSIS — R079 Chest pain, unspecified: Secondary | ICD-10-CM | POA: Diagnosis not present

## 2023-11-13 DIAGNOSIS — Z79899 Other long term (current) drug therapy: Secondary | ICD-10-CM | POA: Insufficient documentation

## 2023-11-13 DIAGNOSIS — R112 Nausea with vomiting, unspecified: Principal | ICD-10-CM | POA: Insufficient documentation

## 2023-11-13 DIAGNOSIS — R1013 Epigastric pain: Secondary | ICD-10-CM | POA: Diagnosis not present

## 2023-11-13 DIAGNOSIS — R1116 Cannabis hyperemesis syndrome: Secondary | ICD-10-CM | POA: Diagnosis not present

## 2023-11-13 DIAGNOSIS — R0602 Shortness of breath: Secondary | ICD-10-CM | POA: Diagnosis not present

## 2023-11-13 DIAGNOSIS — N185 Chronic kidney disease, stage 5: Secondary | ICD-10-CM | POA: Diagnosis present

## 2023-11-13 DIAGNOSIS — I129 Hypertensive chronic kidney disease with stage 1 through stage 4 chronic kidney disease, or unspecified chronic kidney disease: Secondary | ICD-10-CM | POA: Diagnosis not present

## 2023-11-13 DIAGNOSIS — R0789 Other chest pain: Secondary | ICD-10-CM | POA: Diagnosis not present

## 2023-11-13 LAB — BASIC METABOLIC PANEL WITH GFR
Anion gap: 16 — ABNORMAL HIGH (ref 5–15)
BUN: 46 mg/dL — ABNORMAL HIGH (ref 6–20)
CO2: 14 mmol/L — ABNORMAL LOW (ref 22–32)
Calcium: 7.8 mg/dL — ABNORMAL LOW (ref 8.9–10.3)
Chloride: 109 mmol/L (ref 98–111)
Creatinine, Ser: 7.27 mg/dL — ABNORMAL HIGH (ref 0.44–1.00)
GFR, Estimated: 7 mL/min — ABNORMAL LOW (ref >60)
Glucose, Bld: 102 mg/dL — ABNORMAL HIGH (ref 70–99)
Potassium: 5.2 mmol/L — ABNORMAL HIGH (ref 3.5–5.1)
Sodium: 139 mmol/L (ref 135–145)

## 2023-11-13 LAB — CBC
HCT: 29.9 % — ABNORMAL LOW (ref 36.0–46.0)
Hemoglobin: 8.9 g/dL — ABNORMAL LOW (ref 12.0–15.0)
MCH: 25.9 pg — ABNORMAL LOW (ref 26.0–34.0)
MCHC: 29.8 g/dL — ABNORMAL LOW (ref 30.0–36.0)
MCV: 86.9 fL (ref 80.0–100.0)
Platelets: 285 K/uL (ref 150–400)
RBC: 3.44 MIL/uL — ABNORMAL LOW (ref 3.87–5.11)
RDW: 16.1 % — ABNORMAL HIGH (ref 11.5–15.5)
WBC: 5.6 K/uL (ref 4.0–10.5)
nRBC: 0 % (ref 0.0–0.2)

## 2023-11-13 LAB — RAPID URINE DRUG SCREEN, HOSP PERFORMED
Amphetamines: NOT DETECTED
Barbiturates: NOT DETECTED
Benzodiazepines: NOT DETECTED
Cocaine: NOT DETECTED
Opiates: NOT DETECTED
Tetrahydrocannabinol: POSITIVE — AB

## 2023-11-13 LAB — HEPATIC FUNCTION PANEL
ALT: 12 U/L (ref 0–44)
AST: 18 U/L (ref 15–41)
Albumin: 4.1 g/dL (ref 3.5–5.0)
Alkaline Phosphatase: 64 U/L (ref 38–126)
Bilirubin, Direct: <0.1 mg/dL (ref 0.0–0.2)
Total Bilirubin: 0.5 mg/dL (ref 0.0–1.2)
Total Protein: 8.2 g/dL — ABNORMAL HIGH (ref 6.5–8.1)

## 2023-11-13 LAB — TROPONIN I (HIGH SENSITIVITY)
Troponin I (High Sensitivity): 17 ng/L (ref <18)
Troponin I (High Sensitivity): 15 ng/L (ref <18)

## 2023-11-13 LAB — LIPASE, BLOOD: Lipase: 87 U/L — ABNORMAL HIGH (ref 11–51)

## 2023-11-13 LAB — HCG, SERUM, QUALITATIVE: Preg, Serum: NEGATIVE

## 2023-11-13 LAB — CBG MONITORING, ED: Glucose-Capillary: 93 mg/dL (ref 70–99)

## 2023-11-13 MED ORDER — LABETALOL HCL 5 MG/ML IV SOLN
20.0000 mg | INTRAVENOUS | Status: DC
  Administered 2023-11-13 (×2): 20 mg via INTRAVENOUS
  Filled 2023-11-13 (×2): qty 4

## 2023-11-13 MED ORDER — DROPERIDOL 2.5 MG/ML IJ SOLN
1.2500 mg | Freq: Once | INTRAMUSCULAR | Status: AC
  Administered 2023-11-13: 1.25 mg via INTRAVENOUS
  Filled 2023-11-13: qty 2

## 2023-11-13 MED ORDER — HYDRALAZINE HCL 20 MG/ML IJ SOLN: 10.0000 mg | Freq: Four times a day (QID) | INTRAMUSCULAR | Status: DC | PRN

## 2023-11-13 MED ORDER — LORAZEPAM 2 MG/ML IJ SOLN
1.0000 mg | Freq: Once | INTRAMUSCULAR | Status: AC
  Administered 2023-11-13: 1 mg via INTRAVENOUS
  Filled 2023-11-13: qty 1

## 2023-11-13 MED ORDER — CAPSAICIN 0.025 % EX CREA
TOPICAL_CREAM | Freq: Every day | CUTANEOUS | Status: DC
  Filled 2023-11-13: qty 60

## 2023-11-13 MED ORDER — PANTOPRAZOLE SODIUM 40 MG IV SOLR
40.0000 mg | Freq: Once | INTRAVENOUS | Status: AC
  Administered 2023-11-13: 40 mg via INTRAVENOUS
  Filled 2023-11-13: qty 10

## 2023-11-13 MED ORDER — ISOSORBIDE MONONITRATE ER 30 MG PO TB24: 60.0000 mg | ORAL_TABLET | Freq: Every day | ORAL | Status: DC

## 2023-11-13 MED ORDER — ONDANSETRON HCL 4 MG/2ML IJ SOLN
4.0000 mg | Freq: Once | INTRAMUSCULAR | Status: AC
  Administered 2023-11-13: 4 mg via INTRAVENOUS
  Filled 2023-11-13: qty 2

## 2023-11-13 MED ORDER — ONDANSETRON HCL 4 MG/2ML IJ SOLN
4.0000 mg | Freq: Four times a day (QID) | INTRAMUSCULAR | Status: DC | PRN
  Administered 2023-11-13: 4 mg via INTRAVENOUS
  Filled 2023-11-13 (×3): qty 2

## 2023-11-13 MED ORDER — HYDROXYZINE HCL 50 MG/ML IM SOLN
25.0000 mg | Freq: Once | INTRAMUSCULAR | Status: AC
  Administered 2023-11-13: 25 mg via INTRAMUSCULAR
  Filled 2023-11-13 (×2): qty 0.5

## 2023-11-13 MED ORDER — ONDANSETRON 4 MG PO TBDP
4.0000 mg | ORAL_TABLET | Freq: Once | ORAL | Status: AC
  Administered 2023-11-13: 4 mg via ORAL
  Filled 2023-11-13: qty 1

## 2023-11-13 MED ORDER — HYDRALAZINE HCL 20 MG/ML IJ SOLN
10.0000 mg | Freq: Two times a day (BID) | INTRAMUSCULAR | Status: DC
  Administered 2023-11-13: 10 mg via INTRAVENOUS
  Filled 2023-11-13: qty 1

## 2023-11-13 MED ORDER — METOCLOPRAMIDE HCL 5 MG/ML IJ SOLN
10.0000 mg | Freq: Once | INTRAMUSCULAR | Status: AC
  Administered 2023-11-13: 10 mg via INTRAVENOUS
  Filled 2023-11-13: qty 2

## 2023-11-13 MED ORDER — LACTATED RINGERS IV BOLUS: 1000.0000 mL | Freq: Once | INTRAVENOUS | Status: DC

## 2023-11-13 MED ORDER — HYDROMORPHONE HCL 1 MG/ML IJ SOLN
0.5000 mg | Freq: Once | INTRAMUSCULAR | Status: AC
  Administered 2023-11-13: 0.5 mg via INTRAVENOUS
  Filled 2023-11-13: qty 1

## 2023-11-13 MED ORDER — HYDRALAZINE HCL 20 MG/ML IJ SOLN
5.0000 mg | Freq: Four times a day (QID) | INTRAMUSCULAR | Status: DC | PRN
  Administered 2023-11-13: 5 mg via INTRAVENOUS
  Filled 2023-11-13: qty 1

## 2023-11-13 MED ORDER — LACTATED RINGERS IV SOLN: INTRAVENOUS | Status: DC

## 2023-11-13 MED ORDER — LABETALOL HCL 5 MG/ML IV SOLN
10.0000 mg | Freq: Once | INTRAVENOUS | Status: AC
  Administered 2023-11-13: 10 mg via INTRAVENOUS
  Filled 2023-11-13: qty 4

## 2023-11-13 NOTE — ED Notes (Signed)
 Second attempt calling 5C, no answer.

## 2023-11-13 NOTE — ED Triage Notes (Signed)
 Pt here from home with c/o chest pain and nausea some slight sob, b/p high this morning but pt did not take her daily htn meds

## 2023-11-13 NOTE — Hospital Course (Addendum)
 [name] is a 43 y.o. person living with a history of hypertension, history of marijuana use, CKD stage V who presented with vomiting and was admitted for intractable nausea and vomiting.   Nausea and vomiting Pt presented with multiple episodes of vomiting on admission day. Her vomit was clear initially but then turned brown with streaks of blood. Her LFTS and CT scan were unremarkable for any hepatobiliary abnormality or pancreatitis. She endorses using marijuana. Pt has had multiple episodes in the past for similar symptoms for which she has visited the ED. We counseled her on cannabinoid use and its effects . Asked pt to reach out for resources to help cannabinoid use from PCP. Pt received fluids and zofran . We also gave her capsaicin  cream for pain control. Her dark emesis was likely due to retching from vomiting multiple times.   CKD, stage V Per charting, patient was following up with nephrology who referred her to vascular surgeon in order to get a graft for HD access.  However, patient mentioned that she was not aware of the graft and has not followed up with nephrologist as she had to take care of her son.  Patient was alert and oriented to person, place, year upon evaluation and admission.  She has not followed up with nephrology for some time.  Discussed with inpatient nephrology who said they will reach out to her and set up an appointment for her CKD or ESRD for HD.  Patient was informed about nephrology reaching out to her.  Mild AGMA on admission likely secondary to CKD.  On discharge day evaluation patient was alert.  She appeared well and was not in any distress.  She had nausea and vomiting had stopped she has had she was ready to go home.  HTN Blood pressure on admission was 172/111.  Her blood pressure remained high overnight after admission and was likely due to medication nonadherence.  We started patient on home Imdur  and started hydralazine  as needed for SBP greater then 180.   We paused patient's home amlodipine  5 mg along with metoprolol  50 mg as we did not want to lower her blood pressure immediately because she mentioned not taking her medications in a long time.  We restarted her home hydralazine  at half dose and asked her to continue taking it after discharge.  PCP can make changes on hydralazine  dosing as required.  Patient's blood pressure on discharge day in a.m. was 167/87.  Made appointment with primary care for 11/21/23 to follow-up with her admission and medication management.

## 2023-11-13 NOTE — ED Notes (Signed)
 Third attempt calling 5C, no answer.

## 2023-11-13 NOTE — ED Provider Notes (Signed)
  Physical Exam  BP (!) 195/84   Pulse 74   Temp 98.2 F (36.8 C) (Oral)   Resp 20   SpO2 95%   Physical Exam Vitals and nursing note reviewed.  Constitutional:      General: She is not in acute distress.    Appearance: She is well-developed.  HENT:     Head: Normocephalic and atraumatic.  Eyes:     Conjunctiva/sclera: Conjunctivae normal.  Cardiovascular:     Rate and Rhythm: Normal rate and regular rhythm.     Heart sounds: No murmur heard. Pulmonary:     Effort: Pulmonary effort is normal. No respiratory distress.     Breath sounds: Normal breath sounds.  Abdominal:     Palpations: Abdomen is soft.     Tenderness: There is no abdominal tenderness.  Musculoskeletal:        General: No swelling.     Cervical back: Neck supple.  Skin:    General: Skin is warm and dry.     Capillary Refill: Capillary refill takes less than 2 seconds.  Neurological:     Mental Status: She is alert.  Psychiatric:        Mood and Affect: Mood normal.     Procedures  Procedures  ED Course / MDM    Medical Decision Making Amount and/or Complexity of Data Reviewed Labs: ordered. Radiology: ordered.  Risk Prescription drug management. Decision regarding hospitalization.   Epigastric pain.  History of cannabis associated hyperemesis.  Unable to tolerate p.o.  Plan to admit for hyperemesis.  Admitted for further care.     Simon Lavonia SAILOR, MD 11/13/23 ROSENA

## 2023-11-13 NOTE — ED Notes (Addendum)
 Patient is requesting medication for heart burn. Message sent to attending regarding request.

## 2023-11-13 NOTE — ED Provider Notes (Signed)
 [place] [facility]ENC[facility]PARTMENT AT [facility] Provider Note   CSN: [id number] Arrival date & time: 11/13/23  9048     Patient presents with: Chest Pain   [name] is a 43 y.o. female.  {Add pertinent medical, surgical, social history, OB history to HPI:1525} 43 year old female with prior medical history as detailed below presents for evaluation.  Patient complains of epigastric abdominal pain.  Patient has a history of cannabis associated hyperemesis.  Patient reports that with her nausea and vomiting and abdominal pain she is very uncomfortable.  Symptoms began last night.  She denies shortness of breath.  She denies fever.  She has a history of CKD.  She reports that she has NOT been told that she needs to be on dialysis.  Patient does not currently have plan for access for dialysis if she does need it.  The history is provided by the patient and medical records.       Prior to Admission medications   Medication Sig Start Date End Date Taking? Authorizing Provider  amLODipine  (NORVASC ) 10 MG tablet Take 10 mg by mouth daily.    [provider]  calcitRIOL  (ROCALTROL ) 0.25 MCG capsule Take 1 capsule (0.25 mcg total) by mouth daily. 01/25/23   Tawkaliyar, Roya, DO  calcium  carbonate (TUMS - DOSED IN MG ELEMENTAL CALCIUM ) 500 MG chewable tablet Chew 1 tablet (200 mg of elemental calcium  total) by mouth 3 (three) times daily between meals. Patient taking differently: Chew 500 mg by mouth 3 (three) times daily with meals as needed for indigestion or heartburn. 10/01/22   Marylu Gee, DO  dicyclomine  (BENTYL ) 20 MG tablet Take 1 tablet (20 mg total) by mouth 2 (two) times daily. Patient taking differently: Take 20 mg by mouth 2 (two) times daily as needed for spasms. 01/21/23   Prosperi, Christian H, PA-C  ferrous sulfate  325 (65 FE) MG tablet Take 1 tablet (325 mg total) by mouth daily. 10/02/22   Marylu Gee, DO  folic acid  (FOLVITE ) 1 MG tablet Take 1 tablet (1  mg total) by mouth daily. 10/02/22   Marylu Gee, DO  hydrALAZINE  (APRESOLINE ) 100 MG tablet Take 1 tablet (100 mg total) by mouth 3 (three) times daily. 12/29/22 01/28/23  Vernon Ranks, MD  isosorbide  mononitrate (IMDUR ) 60 MG 24 hr tablet Take 1 tablet (60 mg total) by mouth daily. 12/30/22 01/29/23  Vernon Ranks, MD  metoprolol  succinate (TOPROL -XL) 50 MG 24 hr tablet Take 1.5 tablets (75 mg total) by mouth daily. Take with or immediately following a meal. 11/29/22   Arellano Zameza, Priscila, MD  ondansetron  (ZOFRAN -ODT) 4 MG disintegrating tablet Take 1 tablet (4 mg total) by mouth every 6 (six) hours as needed for nausea or vomiting. 01/21/23   Prosperi, Christian H, PA-C  pantoprazole  (PROTONIX ) 40 MG tablet Take 1 tablet (40 mg total) by mouth daily. 10/01/22   Marylu Gee, DO  Vitamin D , Ergocalciferol , (DRISDOL ) 1.25 MG (50000 UNIT) CAPS capsule Take 1 capsule (50,000 Units total) by mouth every Monday. 01/31/23   Tawkaliyar, Roya, DO    Allergies: Shellfish allergy and Morphine  and codeine    Review of Systems  All other systems reviewed and are negative.   Updated Vital Signs BP (!) 210/100 (BP Location: Left Arm) Comment: Triage RN notified  Pulse 80   Resp 20   SpO2 94%   Physical Exam Vitals and nursing note reviewed.  Constitutional:      General: She is not in acute distress.  Appearance: Normal appearance. She is well-developed.     Comments: Actively vomiting on initial evaluation  HENT:     Head: Normocephalic and atraumatic.  Eyes:     Conjunctiva/sclera: Conjunctivae normal.     Pupils: Pupils are equal, round, and reactive to light.  Cardiovascular:     Rate and Rhythm: Normal rate and regular rhythm.     Heart sounds: Normal heart sounds.  Pulmonary:     Effort: Pulmonary effort is normal. No respiratory distress.     Breath sounds: Normal breath sounds.  Abdominal:     General: There is no distension.     Palpations: Abdomen is soft.     Tenderness:  There is no abdominal tenderness.  Musculoskeletal:        General: No deformity. Normal range of motion.     Cervical back: Normal range of motion and neck supple.  Skin:    General: Skin is warm and dry.  Neurological:     General: No focal deficit present.     Mental Status: She is alert and oriented to person, place, and time.     (all labs ordered are listed, but only abnormal results are displayed) Labs Reviewed  BASIC METABOLIC PANEL WITH GFR - Abnormal; Notable for the following components:      Result Value   Potassium 5.2 (*)    CO2 14 (*)    Glucose, Bld 102 (*)    BUN 46 (*)    Creatinine, Ser 7.27 (*)    Calcium  7.8 (*)    GFR, Estimated 7 (*)    Anion gap 16 (*)    All other components within normal limits  CBC - Abnormal; Notable for the following components:   RBC 3.44 (*)    Hemoglobin 8.9 (*)    HCT 29.9 (*)    MCH 25.9 (*)    MCHC 29.8 (*)    RDW 16.1 (*)    All other components within normal limits  HCG, SERUM, QUALITATIVE  HEPATIC FUNCTION PANEL  TROPONIN I (HIGH SENSITIVITY)  TROPONIN I (HIGH SENSITIVITY)    EKG: None  Radiology: No results found.  {Document cardiac monitor, telemetry assessment procedure when appropriate:32947} Procedures   Medications Ordered in the ED  ondansetron  (ZOFRAN -ODT) disintegrating tablet 4 mg (4 mg Oral Given 11/13/23 1027)      {Click here for ABCD2, HEART and other calculators REFRESH Note before signing:1}                              Medical Decision Making Patient is with severe nausea and vomiting.  She reports that she has had symptoms for the last 24 hours.  Patient given multiple antiemetics with some minimal improvement in symptoms.  Additionally, patient is noted to be significantly hypertensive on initial evaluation.  With labetalol  and symptomatic management her blood pressure is mildly improved into the 190s systolic from her initial blood pressure of 220 systolic.  Obtain labs demonstrate  chronic renal insufficiency.  On reevaluation the patient feels minimal improvement in her nausea and vomiting.  She is requesting admission.  Given her comorbidities and continued inability to take p.o. this seems appropriate.  Internal medicine teaching team made aware of case and will evaluate for admission.  Amount and/or Complexity of Data Reviewed Labs: ordered. Radiology: ordered.  Risk Prescription drug management.     {Document critical care time when appropriate  Document review of labs and  clinical decision tools ie CHADS2VASC2, etc  Document your independent review of radiology images and any outside records  Document your discussion with family members, caretakers and with consultants  Document social determinants of health affecting pt's care  Document your decision making why or why not admission, treatments were needed:32947:::1}   Final diagnoses:  Intractable nausea and vomiting    ED Discharge Orders     None

## 2023-11-13 NOTE — ED Provider Triage Note (Signed)
 Emergency Medicine Provider Triage Evaluation Note  [name] , a 43 y.o. female  was evaluated in triage.  Pt with history of cannabis hyperemesis complains of abdominal pain with some nausea and vomiting.  Reports that she had some chest pain the other night.  She is currently chest pain-free.  No urinary, vaginal symptoms.  Reports noncompliance with a blood pressure medication  Review of Systems  Positive:  Negative:   Physical Exam  BP (!) 210/100 (BP Location: Left Arm) Comment: Triage RN notified  Pulse 80   Resp 20   SpO2 94%  Gen:   Awake, vomiting during exam Resp:  Normal effort  MSK:   Moves extremities without difficulty  Other:  Right upper quadrant tenderness  Medical Decision Making  Medically screening exam initiated at 10:43 AM.  Appropriate orders placed.  [name] was informed that the remainder of the evaluation will be completed by another provider, this initial triage assessment does not replace that evaluation, and the importance of remaining in the ED until their evaluation is complete.     Olivia Lynwood DEL, PA-C 11/13/23 1051

## 2023-11-13 NOTE — H&P (Cosign Needed Addendum)
 Date: 11/13/2023               Patient Name:  [name] MRN: [id number]  DOB: 06/13/1980 Age / Sex: 43 y.o., female   PCP: Edgardo Pontiff, DO         Medical Service: Internal Medicine Teaching Service         Attending Physician: Dr. Jone Dauphin      First Contact: Pontiff Edgardo, DO}    Second Contact: Dr. Drue Grow, MD         Pager Information: First Contact Pager: [phone]   Second Contact Pager: [phone]   SUBJECTIVE   Chief Complaint: Vomiting  History of Present Illness: [name] is a 43 y.o. female with PMH of hypertension, history of marijuana use, CKD stage V who presented with vomiting.  Patient said she was feeling fine yesterday, however, her vomiting started this morning.  She has thrown up multiple times and had to come to the ED.  Her vomit has been clear, however, she endorsed streaks of blood while throwing up during her evaluation and said her vomit has looked like that when she came to the ED.  Patient denies any fevers, chills, shortness of breath.  She has not had a bowel movement for the past 2 to 3 days but that is normal for her.  She does not have any urinary symptoms.  She has chest pain which seems more like her heartburn pain.  Also endorses epigastric pain.  Patient has had multiple episodes of intractable nausea and vomiting in the past for which she is coming to the ED.  She endorses taking marijuana.   ED Course: Labs significant for: Urine rapid drug screen: THC positive Lipase: 87 Troponins: 15> 17 Hemoglobin: 8.9 Potassium: 5.2 Imaging : CT abdomen/pelvis showed no acute abnormality of the chest, abdomen, pelvis Received : Droperidol , Dilaudid , Zofran  Consulted : IMTS  Meds:  Not confirmed any medications and not taking any home medications for the past 2 weeks   Current Meds  Medication Sig   calcium  carbonate (TUMS - DOSED IN MG ELEMENTAL CALCIUM ) 500 MG chewable tablet Chew 1 tablet (200 mg of elemental calcium  total) by mouth 3  (three) times daily between meals. (Patient taking differently: Chew 500 mg by mouth 3 (three) times daily with meals as needed for indigestion or heartburn.)    Past Medical History hypertension, history of marijuana use, CKD stage V  Past Surgical History Past Surgical History:  Procedure Laterality Date   ESOPHAGOGASTRODUODENOSCOPY (EGD) WITH PROPOFOL  N/A 08/07/2019   Procedure: ESOPHAGOGASTRODUODENOSCOPY (EGD) WITH PROPOFOL ;  Surgeon: Abran Norleen SAILOR, MD;  Location: [facility];  Service: Endoscopy;  Laterality: N/A;   NO PAST SURGERIES     TUBAL LIGATION  06/20/2011   Procedure: POST PARTUM TUBAL LIGATION;  Surgeon: Winton Felt, MD;  Location: [facility];  Service: Gynecology;  Laterality: Bilateral;  Bilateral post partum tubal ligation     Social:  Lives With: At home 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   OBJECTIVE:   Physical Exam: Blood pressure (!) 172/111, pulse (!) 108, temperature 98.3 F (36.8 C), resp. rate 14, SpO2 97%.  Constitutional: Patient appears weak and in distress, she threw up once which showed streaks of blood in her emesis HENT: Skin tenting negative in the chest Eyes: conjunctiva non-erythematous Neck: supple Cardiovascular: Tachycardic, +2 PT pulses BL Pulmonary/Chest: normal work of breathing on room air, lungs clear to auscultation bilaterally Abdominal: soft, non-tender, non-distended,   MSK: normal bulk and tone Neurological: alert and oriented x 3 Skin: warm    Labs: CBC    Component Value Date/Time   WBC 5.6 11/13/2023 1011   RBC 3.44 (L) 11/13/2023 1011   HGB 8.9 (L) 11/13/2023 1011   HGB 8.5 (L) 08/31/2022 1620   HCT 29.9 (L) 11/13/2023 1011   HCT 29.2 (L) 08/31/2022 1620   PLT 285 11/13/2023 1011   PLT 428 08/31/2022 1620   MCV 86.9 11/13/2023 1011   MCV 77 (L) 08/31/2022 1620   MCH 25.9 (L) 11/13/2023 1011   MCHC 29.8 (L) 11/13/2023 1011   RDW 16.1 (H) 11/13/2023 1011   RDW 22.3 (H) 08/31/2022 1620   LYMPHSABS 1.0 01/21/2023 2216   MONOABS 0.3 01/21/2023 2216   EOSABS 0.0 01/21/2023 2216   BASOSABS 0.0 01/21/2023 2216     CMP     Component Value Date/Time   NA 139 11/13/2023 1011   NA 136 09/16/2022 1445   K 5.2 (H) 11/13/2023 1011   CL 109 11/13/2023 1011   CO2 14 (L) 11/13/2023 1011   GLUCOSE 102 (H) 11/13/2023 1011   BUN 46 (H) 11/13/2023 1011   BUN 40 (H) 09/16/2022 1445   CREATININE 7.27 (H) 11/13/2023 1011   CREATININE 1.24 (H) 06/17/2011 1051   CALCIUM  7.8 (L) 11/13/2023 1011   PROT 8.2 (H) 11/13/2023 1211   PROT 6.8 09/16/2022 1445   ALBUMIN 4.1 11/13/2023 1211   ALBUMIN 3.8 (L) 09/16/2022 1445   AST 18 11/13/2023 1211   ALT 12 11/13/2023 1211   ALKPHOS 64 11/13/2023 1211   BILITOT 0.5 11/13/2023 1211   BILITOT <0.2 09/16/2022 1445   GFRNONAA 7 (L) 11/13/2023 1011   GFRAA 22 (L) 08/08/2019 0314    Imaging:  CT CHEST ABDOMEN PELVIS WO CONTRAST Result Date: 11/13/2023 EXAM: CT CHEST, ABDOMEN AND PELVIS WITHOUT CONTRAST 11/13/2023 02:29:32 PM TECHNIQUE: CT of the chest, abdomen and pelvis was performed without the administration of intravenous contrast. Multiplanar reformatted images are provided for review. Automated exposure control, iterative reconstruction, and/or weight based adjustment of the mA/kV was utilized to reduce the radiation dose to as low as reasonably achievable. COMPARISON: None available. CLINICAL HISTORY:  nausea and vomiting, epigastric abd pain. Table formatting from the original note was not included.; Non con. ; Pt here from home with c/o chest pain and nausea some slight sob, b/p high this morning but pt did not take her daily htn meds FINDINGS: CHEST: MEDIASTINUM AND LYMPH NODES: Trace pericardial fluid is improved from the previous exams. The central airways are clear. No mediastinal, hilar or axillary lymphadenopathy. LUNGS AND PLEURA: No focal consolidation or pulmonary edema. No pleural effusion or pneumothorax. ABDOMEN AND PELVIS: LIVER: The liver is unremarkable. GALLBLADDER AND BILE DUCTS: Gallbladder is unremarkable. No biliary ductal dilatation. SPLEEN: No acute abnormality. PANCREAS: No acute abnormality. ADRENAL GLANDS: No acute abnormality. KIDNEYS, URETERS AND BLADDER:  No stones in the kidneys or ureters. No hydronephrosis. No perinephric or periureteral stranding. Urinary bladder is unremarkable. GI AND BOWEL: Small hiatal hernia. Stomach demonstrates no acute abnormality. There is no bowel obstruction. REPRODUCTIVE ORGANS: Uterus and ovaries are unremarkable. PERITONEUM AND RETROPERITONEUM: No ascites. No free air. VASCULATURE: Aorta is normal in caliber. ABDOMINAL AND PELVIS LYMPH NODES: No lymphadenopathy. BONES AND SOFT TISSUES: No acute osseous abnormality. No focal soft tissue abnormality. IMPRESSION: 1. No acute abnormality of the chest, abdomen, or pelvis identified. 2. Small hiatal hernia. Electronically signed by: Norleen Boxer MD 11/13/2023 03:06 PM EDT RP Workstation: [id number]   DG Chest 2 View Result Date: 11/13/2023 CLINICAL DATA:  Chest pain and nausea. Shortness of breath, hypertension. EXAM: DG CHEST 2V COMPARISON:  08/11/2022 and CT chest 09/29/2022. FINDINGS: Trachea is midline. Heart size normal. Lungs are clear. No pleural fluid. IMPRESSION: Negative. Electronically Signed   By: Newell Eke M.D.   On: 11/13/2023 12:04     EKG: personally reviewed my interpretation  is normal sinus rhythm normal axis deviation.  ASSESSMENT & PLAN:   Assessment & Plan by Problem: Principal Problem:   Intractable nausea and vomiting Active Problems:   CKD (chronic kidney disease), stage V (HCC)   Cannabinoid hyperemesis syndrome   [name] is a 43 y.o. person living with a history of hypertension, history of marijuana use, CKD stage V who presented with vomiting and was admitted for intractable nausea and vomiting.  Nausea and vomiting Patient has had multiple ED visits in the past for nausea and vomiting likely 2/2 cannabinoid hyperemesis syndrome.  She endorses taking marijuana.  Her nausea and vomiting could be due to pancreatitis however less likely as her levels were not extremely elevated.  This could also be due to hepatobiliary cause, however, suspicion is low as her LFTs were at appropriate levels.  Her symptoms this time also likely due to cannabinoid hyperemesis syndrome.  streaks of blood in her vomit likely due to retching.  her abdominal pain was feeling better however her nausea and vomiting was the same.  Counseled her on cannabinoid use and its effects.  Asked her to reach out for resources to help quit her cannabinoid use from her PCP post-discharge  - 1 blood loss of LR now with maintenance fluids overnight - Will continue with Zofran  for nausea  CKD, stage V Per charting, patient requires HD for which she even visited vascular surgeon who recommended she get a graft for HD access.  However patient denied the graft in the vascular surgeons note.  Today, patient reported not knowing that she needed any graft.  She was alert and oriented x 3.  She has not followed up with nephrology as she had to take care of her son.  Consulted her to follow-up with nephrology after discharge.  Mild AGMA likely secondary to CKD.  -OP f/u nephrology  HTN Current blood pressure 172/111.  Limited blood pressure likely due to medication nonadherence.  Cannot take p.o.  meds.  Will give a dose of IV hydralazine  every 12 hours for elevated blood pressure. - Started IV hydralazine  every 12 hours  Best practice: Diet: Normal VTE: SCDs IVF: None,None Code: Full  Disposition planning: Prior to Admission Living Arrangement: Home Anticipated Discharge Location: Home  Dispo: Admit patient to Observation with expected length of stay less than 2 midnights.  Signed: Edgardo Pontiff, DO Internal Medicine Resident  11/13/2023, 6:33 PM  On Call pager: [phone]

## 2023-11-13 NOTE — ED Notes (Signed)
Attempted to call 5C, no answer.

## 2023-11-13 NOTE — ED Notes (Signed)
 I pg internal med for zach

## 2023-11-13 NOTE — ED Notes (Signed)
 Patient returned safely to the room, call bell within reach. Patient updated that a room has been assigned and is being cleaned.

## 2023-11-13 NOTE — ED Notes (Signed)
 Patient ambulated to restroom independently with steady gait.

## 2023-11-14 ENCOUNTER — Other Ambulatory Visit: Payer: Self-pay

## 2023-11-14 ENCOUNTER — Other Ambulatory Visit (HOSPITAL_COMMUNITY): Payer: Self-pay

## 2023-11-14 LAB — COMPREHENSIVE METABOLIC PANEL WITH GFR
ALT: 11 U/L (ref 0–44)
AST: 15 U/L (ref 15–41)
Albumin: 3.8 g/dL (ref 3.5–5.0)
Alkaline Phosphatase: 64 U/L (ref 38–126)
Anion gap: 16 — ABNORMAL HIGH (ref 5–15)
BUN: 49 mg/dL — ABNORMAL HIGH (ref 6–20)
CO2: 13 mmol/L — ABNORMAL LOW (ref 22–32)
Calcium: 7.7 mg/dL — ABNORMAL LOW (ref 8.9–10.3)
Chloride: 112 mmol/L — ABNORMAL HIGH (ref 98–111)
Creatinine, Ser: 7.54 mg/dL — ABNORMAL HIGH (ref 0.44–1.00)
GFR, Estimated: 6 mL/min — ABNORMAL LOW (ref >60)
Glucose, Bld: 148 mg/dL — ABNORMAL HIGH (ref 70–99)
Potassium: 4.6 mmol/L (ref 3.5–5.1)
Sodium: 141 mmol/L (ref 135–145)
Total Bilirubin: 0.7 mg/dL (ref 0.0–1.2)
Total Protein: 7.5 g/dL (ref 6.5–8.1)

## 2023-11-14 LAB — CBC
HCT: 25.5 % — ABNORMAL LOW (ref 36.0–46.0)
Hemoglobin: 8 g/dL — ABNORMAL LOW (ref 12.0–15.0)
MCH: 25.9 pg — ABNORMAL LOW (ref 26.0–34.0)
MCHC: 31.4 g/dL (ref 30.0–36.0)
MCV: 82.5 fL (ref 80.0–100.0)
Platelets: 284 K/uL (ref 150–400)
RBC: 3.09 MIL/uL — ABNORMAL LOW (ref 3.87–5.11)
RDW: 16 % — ABNORMAL HIGH (ref 11.5–15.5)
WBC: 7.5 K/uL (ref 4.0–10.5)
nRBC: 0 % (ref 0.0–0.2)

## 2023-11-14 LAB — HIV ANTIBODY (ROUTINE TESTING W REFLEX): HIV Screen 4th Generation wRfx: NONREACTIVE

## 2023-11-14 MED ORDER — PANTOPRAZOLE SODIUM 40 MG PO TBEC
40.0000 mg | DELAYED_RELEASE_TABLET | Freq: Every day | ORAL | 1 refills | Status: DC
  Filled 2023-11-14: qty 30, 30d supply, fill #0

## 2023-11-14 MED ORDER — ISOSORBIDE MONONITRATE ER 60 MG PO TB24: 60.0000 mg | ORAL_TABLET | Freq: Every day | ORAL | 3 refills | Status: DC

## 2023-11-14 MED ORDER — ONDANSETRON HCL 4 MG PO TABS
4.0000 mg | ORAL_TABLET | Freq: Every day | ORAL | 0 refills | Status: DC | PRN
  Filled 2023-11-14: qty 30, 30d supply, fill #0

## 2023-11-14 MED ORDER — PROCHLORPERAZINE EDISYLATE 10 MG/2ML IJ SOLN
10.0000 mg | Freq: Once | INTRAMUSCULAR | Status: AC
  Administered 2023-11-14: 10 mg via INTRAVENOUS
  Filled 2023-11-14: qty 2

## 2023-11-14 MED ORDER — CAPSAICIN 0.025 % EX CREA
TOPICAL_CREAM | Freq: Every day | CUTANEOUS | 0 refills | Status: DC
  Filled 2023-11-14: qty 60, 30d supply, fill #0

## 2023-11-14 MED ORDER — CALCIUM CARBONATE ANTACID 500 MG PO CHEW
1.0000 | CHEWABLE_TABLET | ORAL | Status: DC | PRN
  Administered 2023-11-14 (×2): 200 mg via ORAL
  Filled 2023-11-14 (×2): qty 1

## 2023-11-14 MED ORDER — LIDOCAINE 5 % EX PTCH
1.0000 | MEDICATED_PATCH | CUTANEOUS | Status: DC
  Administered 2023-11-14: 1 via TRANSDERMAL
  Filled 2023-11-14: qty 1

## 2023-11-14 MED ORDER — ISOSORBIDE MONONITRATE ER 60 MG PO TB24
60.0000 mg | ORAL_TABLET | Freq: Every day | ORAL | Status: DC
  Administered 2023-11-14: 60 mg via ORAL
  Filled 2023-11-14: qty 2

## 2023-11-14 MED ORDER — HYDRALAZINE HCL 50 MG PO TABS
50.0000 mg | ORAL_TABLET | Freq: Three times a day (TID) | ORAL | Status: DC
  Administered 2023-11-14: 50 mg via ORAL
  Filled 2023-11-14: qty 1

## 2023-11-14 MED ORDER — SODIUM BICARBONATE 650 MG PO TABS
1300.0000 mg | ORAL_TABLET | Freq: Two times a day (BID) | ORAL | 11 refills | Status: AC
  Filled 2023-11-14: qty 120, 30d supply, fill #0

## 2023-11-14 MED ORDER — HYDRALAZINE HCL 100 MG PO TABS
50.0000 mg | ORAL_TABLET | Freq: Three times a day (TID) | ORAL | 0 refills | Status: DC
  Filled 2023-11-14: qty 45, 30d supply, fill #0

## 2023-11-14 NOTE — Discharge Instructions (Addendum)
 To [name] or their caretakers,  You were recently admitted to [facility] for cannabinoid hyperemesis syndrome.   Continue taking your home medications with the following changes:  Start taking: Sodium Bicarbonate  650 mg tablet: take 2 tablets by mouth 2 times daily--continue using until you see nephrology who can then manage the dose and tell you how long you need to be on it for  Capsaicin  cream: apply in the area of pain as needed for relief  Zofran  4 mg: Take 1 tablet as needed for nausea  Change how you take: Hydralazine  100 mg: Take 0.5 tablet by mouth 3 times daily 3.   Pause Taking:  Amlodipine  10 mg : Until specified by PCP  Metoprolol  succinate 50 mg: Until specified by PCP 4. Continue Taking : All other home medications   As discussed, your pain and vomiting was likely due to your marijuana use. We recommended you to stop using marijuana in order to get better.     You should seek further medical care if if you have worsening of symptoms.  Please follow up with the following doctors/specialties: PCP appt with Dr. Isobel at [facility] on Monday 11/21/23 at 1:15 pm   We recommend that you also see your primary care doctor in about a week to make sure that you continue to improve. We are so glad that you are feeling better.  Sincerely,  [facility]

## 2023-11-14 NOTE — Plan of Care (Signed)
  Problem: Education: Goal: Knowledge of General Education information will improve Description: Including pain rating scale, medication(s)/side effects and non-pharmacologic comfort measures Outcome: Progressing   Problem: Health Behavior/Discharge Planning: Goal: Ability to manage health-related needs will improve Outcome: Progressing   Problem: Health Behavior/Discharge Planning: Goal: Ability to manage health-related needs will improve Outcome: Progressing   Problem: Clinical Measurements: Goal: Ability to maintain clinical measurements within normal limits will improve Outcome: Progressing Goal: Will remain free from infection Outcome: Progressing Goal: Diagnostic test results will improve Outcome: Progressing Goal: Respiratory complications will improve Outcome: Progressing Goal: Cardiovascular complication will be avoided Outcome: Progressing   Problem: Elimination: Goal: Will not experience complications related to bowel motility Outcome: Progressing Goal: Will not experience complications related to urinary retention Outcome: Progressing   Problem: Pain Managment: Goal: General experience of comfort will improve and/or be controlled Outcome: Progressing   Problem: Safety: Goal: Ability to remain free from injury will improve Outcome: Progressing   Problem: Skin Integrity: Goal: Risk for impaired skin integrity will decrease Outcome: Progressing

## 2023-11-14 NOTE — Progress Notes (Deleted)
 CC: Follow-up  HPI:  [name] is a 43 y.o. female living with a history stated below and presents today for follow-up. Please see problem based assessment and plan for additional details.  Past Medical History:  Diagnosis Date   Anxiety    Asthma    Chronic kidney disease    Depression    Diabetes mellitus    Hyperemesis arising during pregnancy    Hypertension    Mental disorder     Current Outpatient Medications on File Prior to Visit  Medication Sig Dispense Refill   [Paused] amLODipine  (NORVASC ) 10 MG tablet Take 10 mg by mouth daily. (Patient not taking: Reported on 11/13/2023)     calcitRIOL  (ROCALTROL ) 0.25 MCG capsule Take 1 capsule (0.25 mcg total) by mouth daily. (Patient not taking: Reported on 11/13/2023) 30 capsule 1   calcium  carbonate (TUMS - DOSED IN MG ELEMENTAL CALCIUM ) 500 MG chewable tablet Chew 1 tablet (200 mg of elemental calcium  total) by mouth 3 (three) times daily between meals. (Patient taking differently: Chew 500 mg by mouth 3 (three) times daily with meals as needed for indigestion or heartburn.) 150 tablet 1   [START ON 11/15/2023] capsaicin  (ZOSTRIX) 0.025 % cream Apply topically daily. 60 g 0   ferrous sulfate  325 (65 FE) MG tablet Take 1 tablet (325 mg total) by mouth daily. (Patient not taking: Reported on 11/13/2023) 100 tablet 1   folic acid  (FOLVITE ) 1 MG tablet Take 1 tablet (1 mg total) by mouth daily. (Patient not taking: Reported on 11/13/2023) 30 tablet 1   hydrALAZINE  (APRESOLINE ) 100 MG tablet Take 0.5 tablets (50 mg total) by mouth 3 (three) times daily. 45 tablet 0   isosorbide  mononitrate (IMDUR ) 60 MG 24 hr tablet Take 1 tablet (60 mg total) by mouth daily. 30 tablet 0   [Paused] metoprolol  succinate (TOPROL -XL) 50 MG 24 hr tablet Take 1.5 tablets (75 mg total) by mouth daily. Take with or immediately following a meal. (Patient not taking: Reported on 11/13/2023) 30 tablet 1   ondansetron  (ZOFRAN ) 4 MG tablet Take 1 tablet (4 mg  total) by mouth daily as needed for nausea or vomiting. 30 tablet 0   pantoprazole  (PROTONIX ) 40 MG tablet Take 1 tablet (40 mg total) by mouth daily. 30 tablet 1   sodium bicarbonate  650 MG tablet Take 2 tablets (1,300 mg total) by mouth 2 (two) times daily. 120 tablet 11   Vitamin D , Ergocalciferol , (DRISDOL ) 1.25 MG (50000 UNIT) CAPS capsule Take 1 capsule (50,000 Units total) by mouth every Monday. (Patient not taking: Reported on 11/13/2023) 5 capsule 0   Current Facility-Administered Medications on File Prior to Visit  Medication Dose Route Frequency Provider Last Rate Last Admin   calcium  carbonate (TUMS - dosed in mg elemental calcium ) chewable tablet 200 mg of elemental calcium   1 tablet Oral PRN D'Mello, Rosalyn, DO   200 mg of elemental calcium  at 11/14/23 1253   capsaicin  (ZOSTRIX) 0.025 % cream   Topical Daily Tobie Gaines, DO   Given at 11/14/23 9170   hydrALAZINE  (APRESOLINE ) tablet 50 mg  50 mg Oral TID Labaron Digirolamo, DO   50 mg at 11/14/23 1137   isosorbide  mononitrate (IMDUR ) 24 hr tablet 60 mg  60 mg Oral Daily D'Mello, Rosalyn, DO   60 mg at 11/14/23 0135   lidocaine  (LIDODERM ) 5 % 1 patch  1 patch Transdermal Q24H D'Mello, Rosalyn, DO   1 patch at 11/14/23 0418   ondansetron  (ZOFRAN ) injection 4 mg  4  mg Intravenous Q6H PRN Renne Homans, MD   4 mg at 11/13/23 1946    Family History  Problem Relation Age of Onset   Heart disease Mother        CHF   Diabetes Sister    Anesthesia problems Neg Hx     Social History   Socioeconomic History   Marital status: Single    Spouse name: Not on file   Number of children: 2   Years of education: Not on file   Highest education level: Not on file  Occupational History   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    Comment: [employer]  Tobacco Use   Smoking status: Never   Smokeless tobacco: Never  Vaping Use   Vaping status: Never Used  Substance and Sexual Activity   Alcohol use: No   Drug use: Yes    Types:  Marijuana   Sexual activity: Yes    Birth control/protection: None  Other Topics Concern   Not on file  Social History Narrative   Not on file   Social Drivers of Health   Financial Resource Strain: Not on file  Food Insecurity: No Food Insecurity (11/13/2023)   Hunger Vital Sign    Worried About Running Out of Food in the Last Year: Never true    Ran Out of Food in the Last Year: Never true  Transportation Needs: No Transportation Needs (11/13/2023)   PRAPARE - Administrator, Civil Service (Medical): No    Lack of Transportation (Non-Medical): No  Physical Activity: Not on file  Stress: Not on file  Social Connections: Not on file  Intimate Partner Violence: Not At Risk (11/13/2023)   Humiliation, Afraid, Rape, and Kick questionnaire    Fear of Current or Ex-Partner: No    Emotionally Abused: No    Physically Abused: No    Sexually Abused: No    Review of Systems: ROS   There were no vitals filed for this visit.  Physical Exam: Physical Exam   Assessment & Plan:     Patient {[id number]::discussed with,seen with} Dr. {[id number]::Tpoopjfd,Z. Hoffman,Mullen,Narendra,Vincent,Guilloud,Lau,Machen}  Assessment & Plan    No orders of the defined types were placed in this encounter.    Rebecka Pion, D.O. [facility] Internal Medicine, PGY-1 Date 11/14/2023 Time 3:30 PM

## 2023-11-14 NOTE — Plan of Care (Signed)

## 2023-11-14 NOTE — Discharge Summary (Addendum)
 Name: [name] MRN: [id number] DOB: 08-28-1980 43 y.o. PCP: Edgardo Pontiff, DO  Date of Admission: 11/13/2023  9:56 AM Date of Discharge: 11/14/2023 Attending Physician: Dr. Ronnald Sergeant  Discharge Diagnosis: 1. Principal Problem:   Cannabinoid hyperemesis syndrome Active Problems:   CKD (chronic kidney disease), stage V (HCC)   Intractable nausea and vomiting    Discharge Medications: Allergies as of 11/14/2023       Reactions   Shellfish Allergy Anaphylaxis, Swelling   Morphine  And Codeine Nausea And Vomiting        Medication List     PAUSE taking these medications    amLODipine  10 MG tablet Wait to take this until your doctor or other care provider tells you to start again. Commonly known as: NORVASC  Take 10 mg by mouth daily.   metoprolol  succinate 50 MG 24 hr tablet Wait to take this until your doctor or other care provider tells you to start again. Commonly known as: TOPROL -XL Take 1.5 tablets (75 mg total) by mouth daily. Take with or immediately following a meal.       TAKE these medications    calcitRIOL  0.25 MCG capsule Commonly known as: ROCALTROL  Take 1 capsule (0.25 mcg total) by mouth daily.   Calcium  Antacid 500 MG chewable tablet Generic drug: calcium  carbonate Chew 1 tablet (200 mg of elemental calcium  total) by mouth 3 (three) times daily between meals. What changed:  when to take this reasons to take this   capsaicin  0.025 % cream Commonly known as: ZOSTRIX Apply topically daily. Start taking on: November 15, 2023   FeroSul 325 (65 Fe) MG tablet Generic drug: ferrous sulfate  Take 1 tablet (325 mg total) by mouth daily.   folic acid  1 MG tablet Commonly known as: FOLVITE  Take 1 tablet (1 mg total) by mouth daily.   hydrALAZINE  100 MG tablet Commonly known as: APRESOLINE  Take 0.5 tablets (50 mg total) by mouth 3 (three) times daily. What changed: how much to take   ondansetron  4 MG tablet Commonly known as: Zofran  Take 1  tablet (4 mg total) by mouth daily as needed for nausea or vomiting.   pantoprazole  40 MG tablet Commonly known as: PROTONIX  Take 1 tablet (40 mg total) by mouth daily.   sodium bicarbonate  650 MG tablet Take 2 tablets (1,300 mg total) by mouth 2 (two) times daily.   Vitamin D  (Ergocalciferol ) 1.25 MG (50000 UNIT) Caps capsule Commonly known as: DRISDOL  Take 1 capsule (50,000 Units total) by mouth every Monday.        Disposition and follow-up:   [name] was discharged from [facility] in Good condition.  At the hospital follow up visit please address: Discuss about the following meds: We started her on  a. Sodium Bicarbonate  650 mg tablet: take 2 tablets by mouth 2 times daily-- f/u with nephrology scheduled  B. Continue Hydralazine  100 mg with half dose: Take 0.5 tablet by mouth 3 times daily  C. Asked pt to resume her home dose Imdur  60 mg and prescribed it 3.   Pause Taking:  Amlodipine  10 mg : Until specified by PCP  Metoprolol  succinate 50 mg: Until specified by PCP 4. Continue Taking : All other home medications   --Discuss Marijuana use and its adverse effects. Ask if she has symptoms of nausea, vomiting, abdominal pain  2.  Labs / imaging needed at time of follow-up: None  3.  Pending labs/ test needing follow-up: None  Follow-up Appointments: PCP on 11/21/23  at [facility] Course by problem list:   [name] is a 43 y.o. person living with a history of hypertension, history of marijuana use, CKD stage V who was admitted for intractable nausea and vomiting.   Nausea and vomiting Pt presented with multiple episodes of vomiting on admission day. Her vomit was clear initially but then turned brown with streaks of blood. Her LFTS and CT scan were unremarkable for any hepatobiliary abnormality or pancreatitis. She endorses using marijuana. Pt has had multiple episodes in the past for similar symptoms for which she has visited the ED. We  counseled her on cannabinoid use and its effects, advised cessation. Pt received fluids and zofran . We also gave her capsaicin  cream for nausea control. Her dark emesis was likely due to retching from vomiting multiple times. On day of discharge, she appeared well and was not in any distress.  Nausea and vomiting had stopped and she was ready to go home.   CKD, stage V Per charting, patient was following up with nephrology who referred her to vascular surgeon in order to get a graft for HD access.  However, patient mentioned that she was not aware of the graft and has not followed up with nephrologist as she had to take care of her son.  Patient was alert and oriented to person, place, year upon evaluation and admission.  She has not followed up with nephrology for some time.  Discussed with inpatient nephrology who said they will reach out to her and set up an appointment for her CKD or ESRD for HD.  Patient was informed about nephrology reaching out to her.  Acidosis likely secondary to CKD.  On discharge day evaluation patient was alert.    HTN Blood pressure on admission was 172/111.  Her blood pressure remained high overnight after admission and was likely due to medication nonadherence.  We started patient on home Imdur .  We paused patient's home amlodipine  5 mg along with metoprolol  50 mg as we did not want to lower her blood pressure immediately because she mentioned not taking her medications in a long time.  We restarted her home hydralazine  at half dose and asked her to continue taking it after discharge.  PCP can make changes on hydralazine  dosing and/or other anti-hypertensive medications as required.  Patient's blood pressure on discharge day in a.m. was 167/87.  Made appointment with primary care for 11/21/23 to follow-up with her admission and medication management.    Subjective Saw pt at bedside this AM. Reports vomiting stopped yesterday night. She feels well and has no abdominal pain.  Just some chronic right-sided back pain. Had 2 BM last night.   Discharge Exam:   BP (!) 163/84   Pulse (!) 108   Temp 98 F (36.7 C) (Oral)   Resp 16   Ht 5' 7 (1.702 m)   Wt 77 kg   SpO2 99%   BMI 26.59 kg/m  Discharge exam:  Constitutional: pt resting comfortably in bed, in NAD Cardiovascular: tachycardic Pulmonary/Chest: CTAB Abdominal: soft, non tender, non-distended MSK: normal bulk and tone Neurological: Alert Skin: warm    Pertinent Labs, Studies, and Procedures:     Latest Ref Rng & Units 11/14/2023    4:26 AM 11/13/2023   10:11 AM 01/23/2023    9:01 PM  CBC  WBC 4.0 - 10.5 K/uL 7.5  5.6  9.7   Hemoglobin 12.0 - 15.0 g/dL 8.0  8.9  89.0   Hematocrit 36.0 - 46.0 %  25.5  29.9  33.6   Platelets 150 - 400 K/uL 284  285  265        Latest Ref Rng & Units 11/14/2023    4:26 AM 11/13/2023   12:11 PM 11/13/2023   10:11 AM  CMP  Glucose 70 - 99 mg/dL 851   897   BUN 6 - 20 mg/dL 49   46   Creatinine 9.55 - 1.00 mg/dL 2.45   2.72   Sodium 864 - 145 mmol/L 141   139   Potassium 3.5 - 5.1 mmol/L 4.6   5.2   Chloride 98 - 111 mmol/L 112   109   CO2 22 - 32 mmol/L 13   14   Calcium  8.9 - 10.3 mg/dL 7.7   7.8   Total Protein 6.5 - 8.1 g/dL 7.5  8.2    Total Bilirubin 0.0 - 1.2 mg/dL 0.7  0.5    Alkaline Phos 38 - 126 U/L 64  64    AST 15 - 41 U/L 15  18    ALT 0 - 44 U/L 11  12      CT CHEST ABDOMEN PELVIS WO CONTRAST Result Date: 11/13/2023 EXAM: CT CHEST, ABDOMEN AND PELVIS WITHOUT CONTRAST 11/13/2023 02:29:32 PM TECHNIQUE: CT of the chest, abdomen and pelvis was performed without the administration of intravenous contrast. Multiplanar reformatted images are provided for review. Automated exposure control, iterative reconstruction, and/or weight based adjustment of the mA/kV was utilized to reduce the radiation dose to as low as reasonably achievable. COMPARISON: None available. CLINICAL HISTORY: nausea and vomiting, epigastric abd pain. Table formatting from the  original note was not included.; Non con. ; Pt here from home with c/o chest pain and nausea some slight sob, b/p high this morning but pt did not take her daily htn meds FINDINGS: CHEST: MEDIASTINUM AND LYMPH NODES: Trace pericardial fluid is improved from the previous exams. The central airways are clear. No mediastinal, hilar or axillary lymphadenopathy. LUNGS AND PLEURA: No focal consolidation or pulmonary edema. No pleural effusion or pneumothorax. ABDOMEN AND PELVIS: LIVER: The liver is unremarkable. GALLBLADDER AND BILE DUCTS: Gallbladder is unremarkable. No biliary ductal dilatation. SPLEEN: No acute abnormality. PANCREAS: No acute abnormality. ADRENAL GLANDS: No acute abnormality. KIDNEYS, URETERS AND BLADDER: No stones in the kidneys or ureters. No hydronephrosis. No perinephric or periureteral stranding. Urinary bladder is unremarkable. GI AND BOWEL: Small hiatal hernia. Stomach demonstrates no acute abnormality. There is no bowel obstruction. REPRODUCTIVE ORGANS: Uterus and ovaries are unremarkable. PERITONEUM AND RETROPERITONEUM: No ascites. No free air. VASCULATURE: Aorta is normal in caliber. ABDOMINAL AND PELVIS LYMPH NODES: No lymphadenopathy. BONES AND SOFT TISSUES: No acute osseous abnormality. No focal soft tissue abnormality. IMPRESSION: 1. No acute abnormality of the chest, abdomen, or pelvis identified. 2. Small hiatal hernia. Electronically signed by: Norleen Boxer MD 11/13/2023 03:06 PM EDT RP Workstation: [id number]   DG Chest 2 View Result Date: 11/13/2023 CLINICAL DATA:  Chest pain and nausea. Shortness of breath, hypertension. EXAM: DG CHEST 2V COMPARISON:  08/11/2022 and CT chest 09/29/2022. FINDINGS: Trachea is midline. Heart size normal. Lungs are clear. No pleural fluid. IMPRESSION: Negative. Electronically Signed   By: Newell Eke M.D.   On: 11/13/2023 12:04     Discharge Instructions: Discharge Instructions     Call MD for:  persistant nausea and vomiting   Complete  by: As directed    Call MD for:  severe uncontrolled pain   Complete by: As directed  Diet - low sodium heart healthy   Complete by: As directed    Increase activity slowly   Complete by: As directed        Signed: Edgardo Pontiff, DO 11/14/2023, 5:49 PM

## 2023-11-15 ENCOUNTER — Telehealth: Payer: Self-pay

## 2023-11-15 NOTE — Transitions of Care (Post Inpatient/ED Visit) (Signed)
 11/15/2023  Name: [name] MRN: [id number] DOB: 29-Nov-1980  Today's TOC FU Call Status: Today's TOC FU Call Status:: Successful TOC FU Call Completed TOC FU Call Complete Date: 11/15/23 Patient's Name and Date of Birth confirmed.  Transition Care Management Follow-up Telephone Call Date of Discharge: 11/14/23 Discharge Facility: [facility]) Type of Discharge: Inpatient Admission Primary Inpatient Discharge Diagnosis:: N/V How have you been since you were released from the hospital?: Better Any questions or concerns?: No  Items Reviewed: Did you receive and understand the discharge instructions provided?: Yes Medications obtained,verified, and reconciled?: Yes (Medications Reviewed) Any new allergies since your discharge?: No Dietary orders reviewed?: NA Do you have support at home?: Yes People in Home [RPT]: child(ren), adult  Medications Reviewed Today: Medications Reviewed Today     Reviewed by Emmitt Pan, LPN (Licensed Practical Nurse) on 11/15/23 at 1600  Med List Status: <None>   Medication Order Taking? Sig Documenting Provider Last Dose Status Informant  amLODipine  (NORVASC ) 10 MG tablet [id number]  Take 10 mg by mouth daily.  Patient not taking: Reported on 11/15/2023   [provider]  Active Self, Pharmacy Records  calcitRIOL  (ROCALTROL ) 0.25 MCG capsule [id number]  Take 1 capsule (0.25 mcg total) by mouth daily.  Patient not taking: Reported on 11/15/2023   Heddy Barren, DO  Active Pharmacy Records, Self  calcium  carbonate (TUMS - DOSED IN MG ELEMENTAL CALCIUM ) 500 MG chewable tablet [id number] Yes Chew 1 tablet (200 mg of elemental calcium  total) by mouth 3 (three) times daily between meals.  Patient taking differently: Chew 500 mg by mouth 3 (three) times daily with meals as needed for indigestion or heartburn.   Marylu Gee, DO  Active Self, Pharmacy Records  capsaicin  (ZOSTRIX) 0.025 % cream [id number] Yes Apply topically daily. Edgardo Pontiff, DO  Active   ferrous sulfate  325 (65 FE) MG tablet [id number]  Take 1 tablet (325 mg total) by mouth daily.  Patient not taking: Reported on 11/15/2023   Marylu Gee, DO  Active Self, Pharmacy Records  folic acid  (FOLVITE ) 1 MG tablet [id number]  Take 1 tablet (1 mg total) by mouth daily.  Patient not taking: Reported on 11/15/2023   Marylu Gee, DO  Active Self, Pharmacy Records  hydrALAZINE  (APRESOLINE ) 100 MG tablet [id number] Yes Take 0.5 tablets (50 mg total) by mouth 3 (three) times daily. Syeda, Raeeha, DO  Active   isosorbide  mononitrate (IMDUR ) 60 MG 24 hr tablet [id number] Yes Take 1 tablet (60 mg total) by mouth daily. Syeda, Raeeha, DO  Active   metoprolol  succinate (TOPROL -XL) 50 MG 24 hr tablet [id number]  Take 1.5 tablets (75 mg total) by mouth daily. Take with or immediately following a meal.  Patient not taking: Reported on 11/15/2023   Arellano Zameza, Priscila, MD  Active Self, Pharmacy Records           Med Note (COFFELL, JON HERO   Mon Jan 24, 2023 10:19 AM)    ondansetron  (ZOFRAN ) 4 MG tablet [id number] Yes Take 1 tablet (4 mg total) by mouth daily as needed for nausea or vomiting. Syeda, Raeeha, DO  Active   pantoprazole  (PROTONIX ) 40 MG tablet [id number] Yes Take 1 tablet (40 mg total) by mouth daily. Edgardo Pontiff, DO  Active   sodium bicarbonate  650 MG tablet [id number] Yes Take 2 tablets (1,300 mg total) by mouth 2 (two) times daily. Edgardo Pontiff, DO  Active   Vitamin D , Ergocalciferol , (DRISDOL ) 1.25 MG (50000 UNIT) CAPS capsule [id number]  Take 1 capsule (  50,000 Units total) by mouth every Monday.  Patient not taking: Reported on 11/15/2023   Heddy Barren, DO  Active Pharmacy Records, Self            Home Care and Equipment/Supplies: Were Home Health Services Ordered?: NA Any new equipment or medical supplies ordered?: NA  Functional Questionnaire: Do you need assistance with bathing/showering or dressing?: No Do you need assistance with meal  preparation?: No Do you need assistance with eating?: No Do you have difficulty maintaining continence: No Do you need assistance with getting out of bed/getting out of a chair/moving?: No Do you have difficulty managing or taking your medications?: No  Follow up appointments reviewed: PCP Follow-up appointment confirmed?: Yes Date of PCP follow-up appointment?: 11/21/23 Follow-up Provider: [facility] Follow-up appointment confirmed?: NA Do you need transportation to your follow-up appointment?: No Do you understand care options if your condition(s) worsen?: Yes-patient verbalized understanding    SIGNATURE Julian Lemmings, LPN [facility] Nurse Health Advisor Direct Dial [phone]

## 2023-11-16 NOTE — Progress Notes (Signed)
 Established Patient Office Visit  Subjective   Patient ID: [name], female    DOB: 1980-06-03  Age: 43 y.o. MRN: [id number]  Chief Complaint  Patient presents with   Follow-up    Wants TB Screening    Medication Refill    [name] is a 43 y.o. person living with a history of hypertension, history of marijuana use, CKD stage V who presents to clinic today for a hospital follow up. Please see problem-based assessment and plan below for more details.    Follow up Hospitalization  Patient was admitted to [facility] on 10/26 and discharged on 10/27. She was treated for cannabinoid hyperemesis syndrome. Treatment for this included fluids, Zofran , capsaicin  cream for nausea control, and counseling on cannabinoid cessation. Telephone follow up was done on 11/15/2023. She reports excellent compliance with treatment. She reports this condition is improved.   Review of Systems  Constitutional: Negative.   HENT: Negative.    Respiratory: Negative.    Cardiovascular: Negative.   Gastrointestinal: Negative.   Genitourinary: Negative.   Neurological: Negative.   Psychiatric/Behavioral: Negative.       Objective:    BP (!) 171/78 (BP Location: Right Arm, Cuff Size: Normal)   Pulse 84   Temp 98.1 F (36.7 C) (Oral)   Resp (!) 32   Ht 5' 7 (1.702 m)   Wt 159 lb (72.1 kg)   SpO2 100% Comment: room air  BMI 24.90 kg/m  Physical Exam Constitutional:      Appearance: Normal appearance.  HENT:     Mouth/Throat:     Mouth: Mucous membranes are moist.  Cardiovascular:     Rate and Rhythm: Normal rate and regular rhythm.     Pulses: Normal pulses.     Heart sounds: Normal heart sounds.  Pulmonary:     Effort: Pulmonary effort is normal.     Breath sounds: Normal breath sounds.  Abdominal:     General: Abdomen is flat.     Palpations: Abdomen is soft.  Musculoskeletal:        General: Normal range of motion.  Skin:    General: Skin is warm and dry.  Neurological:      General: No focal deficit present.     Mental Status: She is alert and oriented to person, place, and time.  Psychiatric:        Mood and Affect: Mood normal.        Behavior: Behavior normal.        Assessment & Plan:   Patient seen with Dr. Ronnald Sergeant.   Problem List Items Addressed This Visit       Cardiovascular and Mediastinum   Hypertension   BP today 177/83, recheck 171/78. Patient was recently discharged from [facility] hospital on 10/27 and was instructed to take her home Hydralazine  100mg  TID at a half dose (1/2 tablet TID) and Imdur  60mg  daily, however the patient reports she has been taking a whole tablet of her Hydral.  Her home Amlodipine  10mg  and Metoprolol  succinate 50mg  daily were held due to previous patient medication nonadherence. There were concerns for potential hypoperfusion given the length of time since patient had not been on her BP medication, and so she was slowly titrated back onto her home antihypertensives. Patient denies side effects, including weakness, nausea, or lightheadedness. Given her elevated BP today and tolerance to her regimen, will resume Metoprolol  succinate 50mg  and Amlodipine  10mg  daily. Will monitor BMP today.   - BMP today - Continue Hydralazine   100mg  TID, Imdur  60mg  daily - Restart Amlodipine  10mg , Metoprolol  succinate 50mg  daily - Follow up in 1 month to monitor BP      Relevant Medications   amLODipine  (NORVASC ) 10 MG tablet   hydrALAZINE  (APRESOLINE ) 100 MG tablet   isosorbide  mononitrate (IMDUR ) 60 MG 24 hr tablet   metoprolol  succinate (TOPROL -XL) 50 MG 24 hr tablet     Digestive   Cannabinoid hyperemesis syndrome   Patient reports improvement of symptoms since her discharge from [facility] on 10/27. She has not had any nausea, vomiting, or abdominal cramping. She has not needed the Capsaicin  cream for additional pain relief for her hyperemesis. She does report she's cut back on her smoking, having gone from continuous smoking to 1-2 cigars  daily. Counseled on further need to completely cut smoking, but congratulated patient on progress.        Endocrine   Type 2 diabetes mellitus (HCC) - Primary   Chronic, stable. Last A1c 5.8% 01/2023. A1c today to be drawn. She is not currently on any agents for her diabetes and has not been for years. Patient denies hypoglycemic events and denies any sweating, lightheadedness, nausea, or vomiting related to her sugars. She does not check her blood sugar at home. Foot exam today shows palpable PT/DP pulses bilaterally.   - A1c today, uACR, and BMP today - Repeat A1c in 3 months - Amb referral to ophthalmology  - Foot exam today      Relevant Orders   Microalbumin / Creatinine Urine Ratio   BMP8+Anion Gap   Ambulatory referral to Ophthalmology   Glucose, capillary (Completed)   Hemoglobin A1c     Genitourinary   CKD (chronic kidney disease), stage V (HCC)   At discharge, patient was encouraged to follow up with [facility] for her advanced CKD. She had not followed with them in months due to life events. Patient states she had seen their clinic last Wednesday, 10/30, and was told to continue her sodium bicarbonate  650mg , 2 tablets BID. She is to follow up with their clinic in 6 weeks, and follow with vascular surgery to discuss potentials for graft placements this week on 11/6. Per the patient, she is not an HD candidate at this time, but nephrology wants to continuously monitor her. Patient denies taking any vitamin supplements apart from Vitamin D3.  - Continue Sodium bicarb 650mg  2 tablets BID - BMP today        Other   Health care maintenance   Discussed mammogram with patient today, will send for screening mammogram. Patient denies flu shot, but would like to have Tuberculin skin testing done today for her job.   - Screening mammogram - Patient to return in 2 days for repeat skin testing      Relevant Orders   MM 3D SCREENING MAMMOGRAM BILATERAL BREAST    Other Visit Diagnoses       Screening-pulmonary TB       Relevant Orders   TB Skin Test (Completed)       Return in about 1 month (around 12/21/2023) for BP check.     Olivia Bellucci, DO Internal Medicine Resident, PGY-1 2:39 PM 11/21/2023

## 2023-11-16 NOTE — Patient Instructions (Addendum)
 Thank you, [name] for allowing us  to provide your care today. Today we discussed your most recent hospitalization.    Referrals: -Eye doctor -Screening mammogram to screen for breast cancer  New medications: -Hydralazine  100mg  three times daily -Amlodipine  10mg  once daily  -Imdur  60mg  once daily -Metoprolol  succinate 50mg  once daily  I have ordered the following labs for you:  Lab Orders         Microalbumin / Creatinine Urine Ratio         BMP8+Anion Gap         Glucose, capillary         Hemoglobin A1c       I will call if any are abnormal. All of your labs can be accessed through My Chart.   My Chart Access: [email or web address]  Please follow-up in: 2 days with the nurse for another tuberculosis skin test, then in 1 month to follow up on your blood pressure    We look forward to seeing you next time. Please call our clinic at [phone] if you have any questions or concerns. The best time to call is Monday-Friday from 9am-4pm, but there is someone available 24/7. If after hours or the weekend, call the main hospital number and ask for the Internal Medicine Resident On-Call. If you need medication refills, please notify your pharmacy one week in advance and they will send us  a request.   Thank you for letting us  take part in your care. Wishing you the best!   Olivia Heyer, DO 11/21/2023, 2:19 PM [name] [facility]

## 2023-11-18 ENCOUNTER — Telehealth (HOSPITAL_COMMUNITY): Payer: Self-pay | Admitting: Pharmacy Technician

## 2023-11-18 ENCOUNTER — Other Ambulatory Visit (HOSPITAL_COMMUNITY): Payer: Self-pay | Admitting: Internal Medicine

## 2023-11-18 DIAGNOSIS — D631 Anemia in chronic kidney disease: Secondary | ICD-10-CM | POA: Insufficient documentation

## 2023-11-18 NOTE — Telephone Encounter (Signed)
 Auth Submission: NO AUTH NEEDED Site of care: [facility] Payer: [employer] (MEDICAID) Medication & CPT/J Code(s) submitted: Q5106 - PR INJ RETACRIT NON-ESRD USE Diagnosis Code: N18.5, D63.1 Route of submission (phone, fax, portal):  Phone # Fax # Auth type: Buy/Bill HB Units/visits requested: 20000 units q 4 weeks Reference number:  Approval from: 11/18/2023 to 01/18/24    Dagoberto Armour, CPhT [facility] Phone: [phone] 11/18/2023

## 2023-11-21 ENCOUNTER — Ambulatory Visit

## 2023-11-21 ENCOUNTER — Telehealth (HOSPITAL_COMMUNITY): Payer: Self-pay

## 2023-11-21 ENCOUNTER — Other Ambulatory Visit (HOSPITAL_COMMUNITY): Payer: Self-pay | Admitting: Internal Medicine

## 2023-11-21 ENCOUNTER — Other Ambulatory Visit: Payer: Self-pay

## 2023-11-21 VITALS — BP 171/78 | HR 84 | Temp 98.1°F | Resp 32 | Ht 67.0 in | Wt 159.0 lb

## 2023-11-21 DIAGNOSIS — Z111 Encounter for screening for respiratory tuberculosis: Secondary | ICD-10-CM

## 2023-11-21 DIAGNOSIS — E1122 Type 2 diabetes mellitus with diabetic chronic kidney disease: Secondary | ICD-10-CM | POA: Diagnosis not present

## 2023-11-21 DIAGNOSIS — Z0000 Encounter for general adult medical examination without abnormal findings: Secondary | ICD-10-CM

## 2023-11-21 DIAGNOSIS — I120 Hypertensive chronic kidney disease with stage 5 chronic kidney disease or end stage renal disease: Secondary | ICD-10-CM

## 2023-11-21 DIAGNOSIS — N185 Chronic kidney disease, stage 5: Secondary | ICD-10-CM

## 2023-11-21 DIAGNOSIS — R1116 Cannabis hyperemesis syndrome: Secondary | ICD-10-CM | POA: Diagnosis not present

## 2023-11-21 DIAGNOSIS — Z794 Long term (current) use of insulin: Secondary | ICD-10-CM | POA: Diagnosis not present

## 2023-11-21 DIAGNOSIS — E1165 Type 2 diabetes mellitus with hyperglycemia: Secondary | ICD-10-CM

## 2023-11-21 DIAGNOSIS — I10 Essential (primary) hypertension: Secondary | ICD-10-CM

## 2023-11-21 LAB — GLUCOSE, CAPILLARY: Glucose-Capillary: 141 mg/dL — ABNORMAL HIGH (ref 70–99)

## 2023-11-21 MED ORDER — HYDRALAZINE HCL 100 MG PO TABS: 100.0000 mg | ORAL_TABLET | Freq: Three times a day (TID) | ORAL | 0 refills | Status: DC

## 2023-11-21 MED ORDER — ISOSORBIDE MONONITRATE ER 60 MG PO TB24: 60.0000 mg | ORAL_TABLET | Freq: Every day | ORAL | 3 refills | Status: DC

## 2023-11-21 MED ORDER — AMLODIPINE BESYLATE 10 MG PO TABS: 10.0000 mg | ORAL_TABLET | Freq: Every day | ORAL | 11 refills | Status: AC

## 2023-11-21 MED ORDER — METOPROLOL SUCCINATE ER 50 MG PO TB24: 50.0000 mg | ORAL_TABLET | Freq: Every day | ORAL | 1 refills | Status: DC

## 2023-11-21 NOTE — Addendum Note (Signed)
 Addended by: ANTONE DWAYNE SAILOR on: 11/21/2023 05:03 PM   Modules accepted: Orders

## 2023-11-21 NOTE — Assessment & Plan Note (Signed)
 Patient reports improvement of symptoms since her discharge from [facility] on 10/27. She has not had any nausea, vomiting, or abdominal cramping. She has not needed the Capsaicin  cream for additional pain relief for her hyperemesis. She does report she's cut back on her smoking, having gone from continuous smoking to 1-2 cigars daily. Counseled on further need to completely cut smoking, but congratulated patient on progress.

## 2023-11-21 NOTE — Assessment & Plan Note (Signed)
 At discharge, patient was encouraged to follow up with [facility] for her advanced CKD. She had not followed with them in months due to life events. Patient states she had seen their clinic last Wednesday, 10/30, and was told to continue her sodium bicarbonate  650mg , 2 tablets BID. She is to follow up with their clinic in 6 weeks, and follow with vascular surgery to discuss potentials for graft placements this week on 11/6. Per the patient, she is not an HD candidate at this time, but nephrology wants to continuously monitor her. Patient denies taking any vitamin supplements apart from Vitamin D3.  - Continue Sodium bicarb 650mg  2 tablets BID - BMP today

## 2023-11-21 NOTE — Telephone Encounter (Signed)
 Auth Submission: NO AUTH NEEDED Site of care: Site of care: [facility] Payer: [employer] Community Medication & CPT/J Code(s) submitted: Feraheme  (ferumoxytol ) [id number] Diagnosis Code: N18.5/D63.1 Route of submission (phone, fax, portal):  Phone # Fax # Auth type: Buy/Bill HB Units/visits requested: 510mg  x 2 doses Reference number:  Approval from: 11/21/23 to 01/18/24

## 2023-11-21 NOTE — Assessment & Plan Note (Addendum)
 Chronic, stable. Last A1c 5.8% 01/2023. A1c today to be drawn. She is not currently on any agents for her diabetes and has not been for years. Patient denies hypoglycemic events and denies any sweating, lightheadedness, nausea, or vomiting related to her sugars. She does not check her blood sugar at home. Foot exam today shows palpable PT/DP pulses bilaterally.   - A1c today, uACR, and BMP today - Repeat A1c in 3 months - Amb referral to ophthalmology  - Foot exam today

## 2023-11-21 NOTE — Assessment & Plan Note (Addendum)
 BP today 177/83, recheck 171/78. Patient was recently discharged from [facility] hospital on 10/27 and was instructed to take her home Hydralazine  100mg  TID at a half dose (1/2 tablet TID) and Imdur  60mg  daily, however the patient reports she has been taking a whole tablet of her Hydral.  Her home Amlodipine  10mg  and Metoprolol  succinate 50mg  daily were held due to previous patient medication nonadherence. There were concerns for potential hypoperfusion given the length of time since patient had not been on her BP medication, and so she was slowly titrated back onto her home antihypertensives. Patient denies side effects, including weakness, nausea, or lightheadedness. Given her elevated BP today and tolerance to her regimen, will resume Metoprolol  succinate 50mg  and Amlodipine  10mg  daily. Will monitor BMP today.   - BMP today - Continue Hydralazine  100mg  TID, Imdur  60mg  daily - Restart Amlodipine  10mg , Metoprolol  succinate 50mg  daily - Follow up in 1 month to monitor BP

## 2023-11-21 NOTE — Assessment & Plan Note (Signed)
 Discussed mammogram with patient today, will send for screening mammogram. Patient denies flu shot, but would like to have Tuberculin skin testing done today for her job.   - Screening mammogram - Patient to return in 2 days for repeat skin testing

## 2023-11-22 ENCOUNTER — Ambulatory Visit: Payer: Self-pay

## 2023-11-22 DIAGNOSIS — N185 Chronic kidney disease, stage 5: Secondary | ICD-10-CM

## 2023-11-22 LAB — MICROALBUMIN / CREATININE URINE RATIO
Creatinine, Urine: 156.1 mg/dL
Microalb/Creat Ratio: 755 mg/g{creat} — ABNORMAL HIGH (ref 0–29)
Microalbumin, Urine: 1177.8 ug/mL

## 2023-11-22 LAB — HEMOGLOBIN A1C
Est. average glucose Bld gHb Est-mCnc: 111 mg/dL
Hgb A1c MFr Bld: 5.5 % (ref 4.8–5.6)

## 2023-11-23 ENCOUNTER — Encounter: Payer: Self-pay | Admitting: *Deleted

## 2023-11-23 LAB — TB SKIN TEST
Induration: 0 mm
TB Skin Test: NEGATIVE

## 2023-11-23 NOTE — Addendum Note (Signed)
 Addended by: KARNA FELLOWS on: 11/23/2023 11:29 AM   Modules accepted: Level of Service

## 2023-11-23 NOTE — Progress Notes (Unsigned)
 Office Note     CC:  [id number] Requesting Provider:  Edgardo Pontiff, DO  HPI: [name] is a Right handed 43 y.o. (10/08/1980) female with kidney disease who presents at the request of Edgardo Pontiff, DO for permanent HD access. The patient has had no prior access procedures.  No previous tunneled lines.   At her last visit, we had an in-depth discussion about dialysis.  She did not have any usable vein and required an AV graft.  Kidney function was high enough to wait.  She presents today due to recent decline.  On exam, [name] was doing well, accompanied by her son.  A native of [place], she has lived her entire life.  She is a patent examiner by trade.  She continues to work full time. Her son is a engineer, water at medical laboratory scientific officer.  She is aware that her kidneys are failing and that she will need dialysis in the near future.  Past Medical History:  Diagnosis Date   Anxiety    Asthma    Chronic kidney disease    Depression    Diabetes mellitus    Hyperemesis arising during pregnancy    Hypertension    Mental disorder     Past Surgical History:  Procedure Laterality Date   ESOPHAGOGASTRODUODENOSCOPY (EGD) WITH PROPOFOL  N/A 08/07/2019   Procedure: ESOPHAGOGASTRODUODENOSCOPY (EGD) WITH PROPOFOL ;  Surgeon: Abran Norleen SAILOR, MD;  Location: [facility];  Service: Endoscopy;  Laterality: N/A;   NO PAST SURGERIES     TUBAL LIGATION  06/20/2011   Procedure: POST PARTUM TUBAL LIGATION;  Surgeon: Winton Felt, MD;  Location: [facility] ORS;  Service: Gynecology;  Laterality: Bilateral;  Bilateral post partum tubal ligation    Social History   Socioeconomic History   Marital status: Single    Spouse name: Not on file   Number of children: 2   Years of education: Not on file   Highest education level: Not on file  Occupational History   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    Comment: [employer]  Tobacco Use   Smoking status: Never   Smokeless tobacco: Never  Vaping Use   Vaping  status: Never Used  Substance and Sexual Activity   Alcohol use: No   Drug use: Yes    Types: Marijuana   Sexual activity: Yes    Birth control/protection: None  Other Topics Concern   Not on file  Social History Narrative   Not on file   Social Drivers of Health   Financial Resource Strain: Not on file  Food Insecurity: No Food Insecurity (11/13/2023)   Hunger Vital Sign    Worried About Running Out of Food in the Last Year: Never true    Ran Out of Food in the Last Year: Never true  Transportation Needs: No Transportation Needs (11/13/2023)   PRAPARE - Administrator, Civil Service (Medical): No    Lack of Transportation (Non-Medical): No  Physical Activity: Not on file  Stress: Not on file  Social Connections: Not on file  Intimate Partner Violence: Not At Risk (11/13/2023)   Humiliation, Afraid, Rape, and Kick questionnaire    Fear of Current or Ex-Partner: No    Emotionally Abused: No    Physically Abused: No    Sexually Abused: No   Family History  Problem Relation Age of Onset   Heart disease Mother        CHF   Diabetes Sister    Anesthesia problems  Neg Hx     Current Outpatient Medications  Medication Sig Dispense Refill   amLODipine  (NORVASC ) 10 MG tablet Take 1 tablet (10 mg total) by mouth daily. 30 tablet 11   calcium  carbonate (TUMS - DOSED IN MG ELEMENTAL CALCIUM ) 500 MG chewable tablet Chew 1 tablet (200 mg of elemental calcium  total) by mouth 3 (three) times daily between meals. (Patient taking differently: Chew 500 mg by mouth 3 (three) times daily with meals as needed for indigestion or heartburn.) 150 tablet 1   hydrALAZINE  (APRESOLINE ) 100 MG tablet Take 1 tablet (100 mg total) by mouth 3 (three) times daily. 90 tablet 0   isosorbide  mononitrate (IMDUR ) 60 MG 24 hr tablet Take 1 tablet (60 mg total) by mouth daily. 60 tablet 3   metoprolol  succinate (TOPROL -XL) 50 MG 24 hr tablet Take 1 tablet (50 mg total) by mouth daily. Take with or  immediately following a meal. 30 tablet 1   ondansetron  (ZOFRAN ) 4 MG tablet Take 1 tablet (4 mg total) by mouth daily as needed for nausea or vomiting. 30 tablet 0   pantoprazole  (PROTONIX ) 40 MG tablet Take 1 tablet (40 mg total) by mouth daily. 30 tablet 1   sodium bicarbonate  650 MG tablet Take 2 tablets (1,300 mg total) by mouth 2 (two) times daily. 120 tablet 11   No current facility-administered medications for this visit.    Allergies  Allergen Reactions   Shellfish Allergy Anaphylaxis and Swelling   Morphine  And Codeine Nausea And Vomiting     REVIEW OF SYSTEMS:  [X]  denotes positive finding, [ ]  denotes negative finding Cardiac  Comments:  Chest pain or chest pressure:    Shortness of breath upon exertion:    Short of breath when lying flat:    Irregular heart rhythm:        Vascular    Pain in calf, thigh, or hip brought on by ambulation:    Pain in feet at night that wakes you up from your sleep:     Blood clot in your veins:    Leg swelling:         Pulmonary    Oxygen at home:    Productive cough:     Wheezing:         Neurologic    Sudden weakness in arms or legs:     Sudden numbness in arms or legs:     Sudden onset of difficulty speaking or slurred speech:    Temporary loss of vision in one eye:     Problems with dizziness:         Gastrointestinal    Blood in stool:     Vomited blood:         Genitourinary    Burning when urinating:     Blood in urine:        Psychiatric    Major depression:         Hematologic    Bleeding problems:    Problems with blood clotting too easily:        Skin    Rashes or ulcers:        Constitutional    Fever or chills:      PHYSICAL EXAMINATION:  There were no vitals filed for this visit.  General:  WDWN in NAD; vital signs documented above Gait: Not observed HENT: WNL, normocephalic Pulmonary: normal non-labored breathing , without Rales, rhonchi,  wheezing Cardiac: regular HR Abdomen: soft, NT,  no masses Skin: without  rashes Vascular Exam/Pulses:  Right Left  Radial 2+ (normal) 2+ (normal)  Ulnar    Femoral    Popliteal    DP    PT     Extremities: without ischemic changes, without Gangrene , without cellulitis; without open wounds;  Musculoskeletal: no muscle wasting or atrophy  Neurologic: A&O X 3;  No focal weakness or paresthesias are detected Psychiatric:  The pt has Normal affect.   Non-Invasive Vascular Imaging:     See study - no useable vein   ASSESSMENT/PLAN:  Sybel Standish is a 43 y.o. female who presents with chronic kidney disease stage 5  Based on vein mapping and examination, patient does not have usable superficial veins bilaterally.  She would require AV graft.   I had an extensive discussion with this patient in regards to the nature of access surgery, including risk, benefits, and alternatives.   The patient is aware that the risks of access surgery include but are not limited to: bleeding, infection, steal syndrome, nerve damage, ischemic monomelic neuropathy, failure of access to mature, complications related to venous hypertension, and possible need for additional access procedures in the future. Lilianah continues to work full-time.  She will need to coordinate time off, as well as a ride to the hospital.  She stated she would call when she has this sorted. Plan will be for left arm AV graft placement.  Fonda FORBES Rim, MD [facility] [phone]

## 2023-11-23 NOTE — Progress Notes (Signed)
 Internal Medicine Clinic Attending  I was physically present during the key portions of the resident provided service and participated in the medical decision making of patient's management care. I reviewed pertinent patient test results.  The assessment, diagnosis, and plan were formulated together and I agree with the documentation in the resident's note.  Dickie La, MD

## 2023-11-23 NOTE — Progress Notes (Signed)
 A1c improved to 5.5% without hypoglycemic agents. uACR showing severe proteinuria of 755. Given history of CKD5 and GFR of 11, cannot start SLGT2. She is to follow up with nephrology in December. BMP done on day of lab hemolyzed. Informed patient, and she will come in for lab visit to repeat.

## 2023-11-24 ENCOUNTER — Ambulatory Visit: Attending: Vascular Surgery | Admitting: Vascular Surgery

## 2023-11-24 ENCOUNTER — Encounter: Payer: Self-pay | Admitting: Vascular Surgery

## 2023-11-24 VITALS — BP 169/84 | HR 82 | Temp 98.2°F | Resp 18 | Ht 67.0 in | Wt 158.6 lb

## 2023-11-24 DIAGNOSIS — N185 Chronic kidney disease, stage 5: Secondary | ICD-10-CM | POA: Insufficient documentation

## 2023-11-25 ENCOUNTER — Telehealth: Payer: Self-pay

## 2023-11-25 NOTE — Telephone Encounter (Signed)
 Per Dr. Lanis, patient will call to schedule L arm AVG placement.

## 2023-12-01 ENCOUNTER — Inpatient Hospital Stay (HOSPITAL_COMMUNITY): Admission: RE | Admit: 2023-12-01 | Source: Ambulatory Visit

## 2023-12-08 ENCOUNTER — Encounter (HOSPITAL_COMMUNITY)

## 2023-12-13 ENCOUNTER — Ambulatory Visit

## 2023-12-22 ENCOUNTER — Ambulatory Visit

## 2023-12-29 ENCOUNTER — Inpatient Hospital Stay (HOSPITAL_COMMUNITY): Admission: RE | Admit: 2023-12-29

## 2023-12-29 ENCOUNTER — Encounter (HOSPITAL_COMMUNITY)

## 2024-01-06 ENCOUNTER — Encounter (HOSPITAL_COMMUNITY)

## 2024-01-18 ENCOUNTER — Emergency Department (HOSPITAL_COMMUNITY)

## 2024-01-18 ENCOUNTER — Other Ambulatory Visit: Payer: Self-pay

## 2024-01-18 ENCOUNTER — Inpatient Hospital Stay (HOSPITAL_COMMUNITY)
Admission: EM | Admit: 2024-01-18 | Discharge: 2024-01-24 | DRG: 981 | Disposition: A | Discharge status: Home / Self Care

## 2024-01-18 DIAGNOSIS — F32A Depression, unspecified: Secondary | ICD-10-CM | POA: Diagnosis present

## 2024-01-18 DIAGNOSIS — E872 Acidosis, unspecified: Secondary | ICD-10-CM | POA: Diagnosis present

## 2024-01-18 DIAGNOSIS — E1122 Type 2 diabetes mellitus with diabetic chronic kidney disease: Secondary | ICD-10-CM | POA: Diagnosis present

## 2024-01-18 DIAGNOSIS — R112 Nausea with vomiting, unspecified: Secondary | ICD-10-CM

## 2024-01-18 DIAGNOSIS — E119 Type 2 diabetes mellitus without complications: Secondary | ICD-10-CM

## 2024-01-18 DIAGNOSIS — T465X6A Underdosing of other antihypertensive drugs, initial encounter: Secondary | ICD-10-CM | POA: Diagnosis present

## 2024-01-18 DIAGNOSIS — Z91013 Allergy to seafood: Secondary | ICD-10-CM

## 2024-01-18 DIAGNOSIS — D631 Anemia in chronic kidney disease: Secondary | ICD-10-CM | POA: Diagnosis present

## 2024-01-18 DIAGNOSIS — Z885 Allergy status to narcotic agent status: Secondary | ICD-10-CM

## 2024-01-18 DIAGNOSIS — R7989 Other specified abnormal findings of blood chemistry: Secondary | ICD-10-CM | POA: Diagnosis present

## 2024-01-18 DIAGNOSIS — Z91148 Patient's other noncompliance with medication regimen for other reason: Secondary | ICD-10-CM

## 2024-01-18 DIAGNOSIS — N186 End stage renal disease: Secondary | ICD-10-CM | POA: Diagnosis present

## 2024-01-18 DIAGNOSIS — I120 Hypertensive chronic kidney disease with stage 5 chronic kidney disease or end stage renal disease: Secondary | ICD-10-CM | POA: Diagnosis present

## 2024-01-18 DIAGNOSIS — I10 Essential (primary) hypertension: Principal | ICD-10-CM

## 2024-01-18 DIAGNOSIS — K5900 Constipation, unspecified: Secondary | ICD-10-CM | POA: Diagnosis not present

## 2024-01-18 DIAGNOSIS — N185 Chronic kidney disease, stage 5: Secondary | ICD-10-CM | POA: Diagnosis present

## 2024-01-18 DIAGNOSIS — J45909 Unspecified asthma, uncomplicated: Secondary | ICD-10-CM | POA: Diagnosis present

## 2024-01-18 DIAGNOSIS — Z79899 Other long term (current) drug therapy: Secondary | ICD-10-CM

## 2024-01-18 DIAGNOSIS — Z833 Family history of diabetes mellitus: Secondary | ICD-10-CM

## 2024-01-18 DIAGNOSIS — Z8249 Family history of ischemic heart disease and other diseases of the circulatory system: Secondary | ICD-10-CM

## 2024-01-18 DIAGNOSIS — R1116 Cannabis hyperemesis syndrome: Principal | ICD-10-CM | POA: Diagnosis present

## 2024-01-18 DIAGNOSIS — F419 Anxiety disorder, unspecified: Secondary | ICD-10-CM | POA: Diagnosis present

## 2024-01-18 DIAGNOSIS — K209 Esophagitis, unspecified without bleeding: Secondary | ICD-10-CM | POA: Diagnosis present

## 2024-01-18 DIAGNOSIS — I160 Hypertensive urgency: Secondary | ICD-10-CM | POA: Diagnosis present

## 2024-01-18 DIAGNOSIS — E559 Vitamin D deficiency, unspecified: Secondary | ICD-10-CM | POA: Diagnosis present

## 2024-01-18 DIAGNOSIS — F1411 Cocaine abuse, in remission: Secondary | ICD-10-CM | POA: Diagnosis present

## 2024-01-18 DIAGNOSIS — E8729 Other acidosis: Secondary | ICD-10-CM | POA: Insufficient documentation

## 2024-01-18 DIAGNOSIS — Z992 Dependence on renal dialysis: Secondary | ICD-10-CM

## 2024-01-18 LAB — CBC WITH DIFFERENTIAL/PLATELET
Abs Immature Granulocytes: 0.02 K/uL (ref 0.00–0.07)
Basophils Absolute: 0 K/uL (ref 0.0–0.1)
Basophils Relative: 0 %
Eosinophils Absolute: 0 K/uL (ref 0.0–0.5)
Eosinophils Relative: 0 %
HCT: 31.3 % — ABNORMAL LOW (ref 36.0–46.0)
Hemoglobin: 9.5 g/dL — ABNORMAL LOW (ref 12.0–15.0)
Immature Granulocytes: 0 %
Lymphocytes Relative: 10 %
Lymphs Abs: 0.7 K/uL (ref 0.7–4.0)
MCH: 25.9 pg — ABNORMAL LOW (ref 26.0–34.0)
MCHC: 30.4 g/dL (ref 30.0–36.0)
MCV: 85.3 fL (ref 80.0–100.0)
Monocytes Absolute: 0.1 K/uL (ref 0.1–1.0)
Monocytes Relative: 2 %
Neutro Abs: 6.3 K/uL (ref 1.7–7.7)
Neutrophils Relative %: 88 %
Platelets: 221 K/uL (ref 150–400)
RBC: 3.67 MIL/uL — ABNORMAL LOW (ref 3.87–5.11)
RDW: 16.4 % — ABNORMAL HIGH (ref 11.5–15.5)
Smear Review: NORMAL
WBC: 7.2 K/uL (ref 4.0–10.5)
nRBC: 0 % (ref 0.0–0.2)

## 2024-01-18 LAB — COMPREHENSIVE METABOLIC PANEL WITH GFR
ALT: 14 U/L (ref 0–44)
AST: 26 U/L (ref 15–41)
Albumin: 4.7 g/dL (ref 3.5–5.0)
Alkaline Phosphatase: 96 U/L (ref 38–126)
Anion gap: 23 — ABNORMAL HIGH (ref 5–15)
BUN: 51 mg/dL — ABNORMAL HIGH (ref 6–20)
CO2: 9 mmol/L — ABNORMAL LOW (ref 22–32)
Calcium: 8.2 mg/dL — ABNORMAL LOW (ref 8.9–10.3)
Chloride: 106 mmol/L (ref 98–111)
Creatinine, Ser: 7.44 mg/dL — ABNORMAL HIGH (ref 0.44–1.00)
GFR, Estimated: 6 mL/min — ABNORMAL LOW
Glucose, Bld: 195 mg/dL — ABNORMAL HIGH (ref 70–99)
Potassium: 4.9 mmol/L (ref 3.5–5.1)
Sodium: 139 mmol/L (ref 135–145)
Total Bilirubin: 0.4 mg/dL (ref 0.0–1.2)
Total Protein: 8.4 g/dL — ABNORMAL HIGH (ref 6.5–8.1)

## 2024-01-18 LAB — I-STAT CHEM 8, ED
BUN: 56 mg/dL — ABNORMAL HIGH (ref 6–20)
Calcium, Ion: 0.88 mmol/L — CL (ref 1.15–1.40)
Chloride: 115 mmol/L — ABNORMAL HIGH (ref 98–111)
Creatinine, Ser: 8.4 mg/dL — ABNORMAL HIGH (ref 0.44–1.00)
Glucose, Bld: 194 mg/dL — ABNORMAL HIGH (ref 70–99)
HCT: 32 % — ABNORMAL LOW (ref 36.0–46.0)
Hemoglobin: 10.9 g/dL — ABNORMAL LOW (ref 12.0–15.0)
Potassium: 4.6 mmol/L (ref 3.5–5.1)
Sodium: 141 mmol/L (ref 135–145)
TCO2: 14 mmol/L — ABNORMAL LOW (ref 22–32)

## 2024-01-18 LAB — URINALYSIS, ROUTINE W REFLEX MICROSCOPIC
Bilirubin Urine: NEGATIVE
Glucose, UA: 150 mg/dL — AB
Ketones, ur: 5 mg/dL — AB
Leukocytes,Ua: NEGATIVE
Nitrite: NEGATIVE
Protein, ur: >=300 mg/dL — AB
RBC / HPF: >50 RBC/hpf (ref 0–5)
Specific Gravity, Urine: 1.009 (ref 1.005–1.030)
pH: 8 (ref 5.0–8.0)

## 2024-01-18 LAB — HCG, SERUM, QUALITATIVE: Preg, Serum: NEGATIVE

## 2024-01-18 LAB — LIPASE, BLOOD: Lipase: 153 U/L — ABNORMAL HIGH (ref 11–51)

## 2024-01-18 MED ORDER — SODIUM CHLORIDE 0.9 % IV BOLUS
500.0000 mL | Freq: Once | INTRAVENOUS | Status: AC
  Administered 2024-01-18: 500 mL via INTRAVENOUS

## 2024-01-18 MED ORDER — ONDANSETRON 4 MG PO TBDP
4.0000 mg | ORAL_TABLET | Freq: Once | ORAL | Status: AC
  Administered 2024-01-18: 4 mg via ORAL
  Filled 2024-01-18: qty 1

## 2024-01-18 MED ORDER — ONDANSETRON HCL 4 MG/2ML IJ SOLN
4.0000 mg | Freq: Once | INTRAMUSCULAR | Status: AC
  Administered 2024-01-18: 4 mg via INTRAVENOUS
  Filled 2024-01-18: qty 2

## 2024-01-18 MED ORDER — HYDRALAZINE HCL 20 MG/ML IJ SOLN
5.0000 mg | Freq: Once | INTRAMUSCULAR | Status: AC
  Administered 2024-01-18: 5 mg via INTRAVENOUS
  Filled 2024-01-18: qty 1

## 2024-01-18 MED ORDER — MIDAZOLAM HCL (PF) 2 MG/2ML IJ SOLN
5.0000 mg | Freq: Once | INTRAMUSCULAR | Status: AC
  Administered 2024-01-18: 5 mg via INTRAMUSCULAR
  Filled 2024-01-18: qty 6

## 2024-01-18 MED ORDER — HYDROMORPHONE HCL 1 MG/ML IJ SOLN
0.5000 mg | Freq: Once | INTRAMUSCULAR | Status: AC
  Administered 2024-01-18: 0.5 mg via INTRAVENOUS
  Filled 2024-01-18: qty 1

## 2024-01-18 MED ORDER — HYDRALAZINE HCL 20 MG/ML IJ SOLN
10.0000 mg | Freq: Once | INTRAMUSCULAR | Status: AC
  Administered 2024-01-18: 10 mg via INTRAVENOUS
  Filled 2024-01-18: qty 1

## 2024-01-18 NOTE — ED Provider Triage Note (Signed)
 Emergency Medicine Provider Triage Evaluation Note  Elasha Tess , a 43 y.o. female  was evaluated in triage.  Pt complains of abdominal pain with nausea and vomiting, onset this morning. History of anxiety, diabetes, and CKD  Review of Systems  Positive: Abdominal pain, nausea, vomiting Negative: Chest pain, shortness of breath, fever, chills  Physical Exam  BP (!) 205/112   Pulse 97   Temp (!) 97.4 F (36.3 C)   Resp 20   Ht 5' 7 (1.702 m)   Wt 71.9 kg   SpO2 100%   BMI 24.83 kg/m  Gen:   Awake, no distress   Resp:  Normal effort  MSK:   Moves extremities without difficulty  Other:    Medical Decision Making  Medically screening exam initiated at 4:09 PM.  Appropriate orders placed.  Sweden Lesure was informed that the remainder of the evaluation will be completed by another provider, this initial triage assessment does not replace that evaluation, and the importance of remaining in the ED until their evaluation is complete.     Claudene Lenis, NP 01/18/24 [phone]

## 2024-01-18 NOTE — ED Provider Notes (Signed)
 " [place] [facility] Provider Note   CSN: [id number] Arrival date & time: 01/18/24  1449     Patient presents with: Abdominal Pain  HPI [name] is a 43 y.o. female with CKD stage V not on dialysis, frequent marijuana use, type 2 diabetes presenting for abdominal pain and vomiting that started this morning.  Abdominal pain is all about the upper abdomen but does not radiate to the back.  Denies chest pain and shortness of breath.  States she has had some diarrhea this evening as well.  Does report using marijuana yesterday.  Denies cough or fever. Reports that she has not been taking her BP meds for a couple of months.   {Add pertinent medical, surgical, social history, OB history to HPI:32947}  Abdominal Pain      Prior to Admission medications  Medication Sig Start Date End Date Taking? Authorizing Provider  amLODipine  (NORVASC ) 10 MG tablet Take 1 tablet (10 mg total) by mouth daily. 11/21/23 11/20/24  Amilibia, Jaden, DO  calcium  carbonate (TUMS - DOSED IN MG ELEMENTAL CALCIUM ) 500 MG chewable tablet Chew 1 tablet (200 mg of elemental calcium  total) by mouth 3 (three) times daily between meals. Patient taking differently: Chew 500 mg by mouth 3 (three) times daily with meals as needed for indigestion or heartburn. 10/01/22   Marylu Gee, DO  hydrALAZINE  (APRESOLINE ) 100 MG tablet Take 1 tablet (100 mg total) by mouth 3 (three) times daily. 11/21/23 12/21/23  Amilibia, Jaden, DO  isosorbide  mononitrate (IMDUR ) 60 MG 24 hr tablet Take 1 tablet (60 mg total) by mouth daily. 11/21/23 12/21/23  Amilibia, Jaden, DO  metoprolol  succinate (TOPROL -XL) 50 MG 24 hr tablet Take 1 tablet (50 mg total) by mouth daily. Take with or immediately following a meal. 11/21/23   Amilibia, Jaden, DO  ondansetron  (ZOFRAN ) 4 MG tablet Take 1 tablet (4 mg total) by mouth daily as needed for nausea or vomiting. 11/14/23 11/13/24  Syeda, Raeeha, DO  pantoprazole  (PROTONIX ) 40  MG tablet Take 1 tablet (40 mg total) by mouth daily. 11/14/23   Syeda, Raeeha, DO  sodium bicarbonate  650 MG tablet Take 2 tablets (1,300 mg total) by mouth 2 (two) times daily. 11/14/23 11/13/24  Edgardo Pontiff, DO    Allergies: Shellfish allergy and Morphine  and codeine    Review of Systems  Gastrointestinal:  Positive for abdominal pain.    Physical Exam   Vitals:   01/18/24 2215 01/18/24 2230  BP: (!) 218/118 (!) 199/100  Pulse: (!) 107 (!) 113  Resp: 18 17  Temp:    SpO2: 100% 100%    CONSTITUTIONAL:  well-appearing, anxious, asking for something for anxiety NEURO:  Alert and oriented x 3, CN 3-12 grossly intact EYES:  eyes equal and reactive ENT/NECK:  Supple, no stridor  CARDIO:  Tachycardic, regular rhythm, appears well-perfused  PULM:  No respiratory distress, CTAB GI/GU:  non-distended, soft, non tender MSK/SPINE:  No gross deformities, no edema, moves all extremities  SKIN:  no rash, atraumatic  *Additional and/or pertinent findings included in MDM below   (all labs ordered are listed, but only abnormal results are displayed) Labs Reviewed  LIPASE, BLOOD - Abnormal; Notable for the following components:      Result Value   Lipase 153 (*)    All other components within normal limits  COMPREHENSIVE METABOLIC PANEL WITH GFR - Abnormal; Notable for the following components:   CO2 9 (*)    Glucose, Bld 195 (*)  BUN 51 (*)    Creatinine, Ser 7.44 (*)    Calcium  8.2 (*)    Total Protein 8.4 (*)    GFR, Estimated 6 (*)    Anion gap 23 (*)    All other components within normal limits  URINALYSIS, ROUTINE W REFLEX MICROSCOPIC - Abnormal; Notable for the following components:   Color, Urine STRAW (*)    APPearance HAZY (*)    Glucose, UA 150 (*)    Hgb urine dipstick LARGE (*)    Ketones, ur 5 (*)    Protein, ur >=300 (*)    Bacteria, UA RARE (*)    All other components within normal limits  CBC WITH DIFFERENTIAL/PLATELET - Abnormal; Notable for the  following components:   RBC 3.67 (*)    Hemoglobin 9.5 (*)    HCT 31.3 (*)    MCH 25.9 (*)    RDW 16.4 (*)    All other components within normal limits  I-STAT CHEM 8, ED - Abnormal; Notable for the following components:   Chloride 115 (*)    BUN 56 (*)    Creatinine, Ser 8.40 (*)    Glucose, Bld 194 (*)    Calcium , Ion 0.88 (*)    TCO2 14 (*)    Hemoglobin 10.9 (*)    HCT 32.0 (*)    All other components within normal limits  HCG, SERUM, QUALITATIVE  PATHOLOGIST SMEAR REVIEW  TROPONIN T, HIGH SENSITIVITY    EKG: None  Radiology: No results found.  {Document cardiac monitor, telemetry assessment procedure when appropriate:32947} .Ultrasound ED Peripheral IV (Provider)  Date/Time: 01/18/2024 11:06 PM  Performed by: Lang Norleen POUR, PA-C Authorized by: Lang Norleen POUR, PA-C   Procedure details:    Indications: multiple failed IV attempts     Skin Prep: isopropyl alcohol     Location:  Left AC   Angiocath:  20 G   Bedside Ultrasound Guided: Yes     Images: not archived     Patient tolerated procedure without complications: Yes     Dressing applied: Yes      Medications Ordered in the ED  hydrALAZINE  (APRESOLINE ) injection 10 mg (has no administration in time range)  ondansetron  (ZOFRAN -ODT) disintegrating tablet 4 mg (4 mg Oral Given 01/18/24 1617)  midazolam  PF (VERSED ) injection 5 mg (5 mg Intramuscular Given 01/18/24 2121)      {Click here for ABCD2, HEART and other calculators REFRESH Note before signing:1}                              Medical Decision Making Amount and/or Complexity of Data Reviewed Labs: ordered. Radiology: ordered.  Risk Prescription drug management.   Initial Impression and Ddx *** Patient PMH that increases complexity of ED encounter:  ***  Interpretation of Diagnostics I independent reviewed and interpreted the labs as followed: ***  - I independently visualized the following imaging with scope of interpretation limited  to determining acute life threatening conditions related to emergency care: ***, which revealed ***  Patient Reassessment and Ultimate Disposition/Management ***  Patient management required discussion with the following services or consulting groups:  {BEROCONSULT:26841}  Complexity of Problems Addressed {BEROCOPA:26833}  Additional Data Reviewed and Analyzed Further history obtained from: {BERODATA:26834}  Patient Encounter Risk Assessment {BERORISK:26838}    {Document critical care time when appropriate  Document review of labs and clinical decision tools ie CHADS2VASC2, etc  Document your independent review of radiology images  and any outside records  Document your discussion with family members, caretakers and with consultants  Document social determinants of health affecting pt's care  Document your decision making why or why not admission, treatments were needed:32947:::1}   Final diagnoses:  None    ED Discharge Orders     None        "

## 2024-01-18 NOTE — ED Triage Notes (Signed)
 Pt arrives POV for new onset abdominal pain and vomiting beginning this morning. Pt notes a reddish-brown color to her emesis. Also reports chills.  Hx of kidney disease, not on dialysis. Took Tylenol  this am.

## 2024-01-19 DIAGNOSIS — Z91148 Patient's other noncompliance with medication regimen for other reason: Secondary | ICD-10-CM | POA: Diagnosis not present

## 2024-01-19 DIAGNOSIS — R000 Tachycardia, unspecified: Secondary | ICD-10-CM | POA: Diagnosis not present

## 2024-01-19 DIAGNOSIS — Z992 Dependence on renal dialysis: Secondary | ICD-10-CM | POA: Diagnosis not present

## 2024-01-19 DIAGNOSIS — F419 Anxiety disorder, unspecified: Secondary | ICD-10-CM | POA: Diagnosis present

## 2024-01-19 DIAGNOSIS — E1122 Type 2 diabetes mellitus with diabetic chronic kidney disease: Secondary | ICD-10-CM | POA: Diagnosis present

## 2024-01-19 DIAGNOSIS — J45909 Unspecified asthma, uncomplicated: Secondary | ICD-10-CM | POA: Diagnosis present

## 2024-01-19 DIAGNOSIS — R1116 Cannabis hyperemesis syndrome: Secondary | ICD-10-CM | POA: Diagnosis present

## 2024-01-19 DIAGNOSIS — D631 Anemia in chronic kidney disease: Secondary | ICD-10-CM | POA: Diagnosis present

## 2024-01-19 DIAGNOSIS — Z833 Family history of diabetes mellitus: Secondary | ICD-10-CM | POA: Diagnosis not present

## 2024-01-19 DIAGNOSIS — N186 End stage renal disease: Secondary | ICD-10-CM | POA: Diagnosis present

## 2024-01-19 DIAGNOSIS — I120 Hypertensive chronic kidney disease with stage 5 chronic kidney disease or end stage renal disease: Secondary | ICD-10-CM | POA: Diagnosis present

## 2024-01-19 DIAGNOSIS — T465X6A Underdosing of other antihypertensive drugs, initial encounter: Secondary | ICD-10-CM | POA: Diagnosis present

## 2024-01-19 DIAGNOSIS — Z8679 Personal history of other diseases of the circulatory system: Secondary | ICD-10-CM | POA: Diagnosis not present

## 2024-01-19 DIAGNOSIS — R7989 Other specified abnormal findings of blood chemistry: Secondary | ICD-10-CM | POA: Diagnosis present

## 2024-01-19 DIAGNOSIS — N185 Chronic kidney disease, stage 5: Secondary | ICD-10-CM

## 2024-01-19 DIAGNOSIS — K209 Esophagitis, unspecified without bleeding: Secondary | ICD-10-CM | POA: Diagnosis present

## 2024-01-19 DIAGNOSIS — Z91013 Allergy to seafood: Secondary | ICD-10-CM | POA: Diagnosis not present

## 2024-01-19 DIAGNOSIS — K5900 Constipation, unspecified: Secondary | ICD-10-CM | POA: Diagnosis not present

## 2024-01-19 DIAGNOSIS — Z79899 Other long term (current) drug therapy: Secondary | ICD-10-CM

## 2024-01-19 DIAGNOSIS — F1411 Cocaine abuse, in remission: Secondary | ICD-10-CM | POA: Diagnosis present

## 2024-01-19 DIAGNOSIS — E559 Vitamin D deficiency, unspecified: Secondary | ICD-10-CM | POA: Diagnosis present

## 2024-01-19 DIAGNOSIS — F32A Depression, unspecified: Secondary | ICD-10-CM | POA: Diagnosis present

## 2024-01-19 DIAGNOSIS — Z885 Allergy status to narcotic agent status: Secondary | ICD-10-CM | POA: Diagnosis not present

## 2024-01-19 DIAGNOSIS — E872 Acidosis, unspecified: Secondary | ICD-10-CM | POA: Diagnosis present

## 2024-01-19 DIAGNOSIS — E8729 Other acidosis: Secondary | ICD-10-CM | POA: Insufficient documentation

## 2024-01-19 DIAGNOSIS — I160 Hypertensive urgency: Secondary | ICD-10-CM | POA: Diagnosis present

## 2024-01-19 DIAGNOSIS — Z8249 Family history of ischemic heart disease and other diseases of the circulatory system: Secondary | ICD-10-CM | POA: Diagnosis not present

## 2024-01-19 LAB — CBG MONITORING, ED
Glucose-Capillary: 137 mg/dL — ABNORMAL HIGH (ref 70–99)
Glucose-Capillary: 148 mg/dL — ABNORMAL HIGH (ref 70–99)

## 2024-01-19 LAB — BASIC METABOLIC PANEL WITH GFR
Anion gap: 19 — ABNORMAL HIGH (ref 5–15)
Anion gap: 17 — ABNORMAL HIGH (ref 5–15)
BUN: 57 mg/dL — ABNORMAL HIGH (ref 6–20)
BUN: 56 mg/dL — ABNORMAL HIGH (ref 6–20)
CO2: 15 mmol/L — ABNORMAL LOW (ref 22–32)
CO2: 16 mmol/L — ABNORMAL LOW (ref 22–32)
Calcium: 8.3 mg/dL — ABNORMAL LOW (ref 8.9–10.3)
Calcium: 6.6 mg/dL — ABNORMAL LOW (ref 8.9–10.3)
Chloride: 107 mmol/L (ref 98–111)
Chloride: 104 mmol/L (ref 98–111)
Creatinine, Ser: 7.37 mg/dL — ABNORMAL HIGH (ref 0.44–1.00)
Creatinine, Ser: 8.08 mg/dL — ABNORMAL HIGH (ref 0.44–1.00)
GFR, Estimated: 6 mL/min — ABNORMAL LOW
GFR, Estimated: 6 mL/min — ABNORMAL LOW
Glucose, Bld: 202 mg/dL — ABNORMAL HIGH (ref 70–99)
Glucose, Bld: 163 mg/dL — ABNORMAL HIGH (ref 70–99)
Potassium: 4.5 mmol/L (ref 3.5–5.1)
Potassium: 4.2 mmol/L (ref 3.5–5.1)
Sodium: 141 mmol/L (ref 135–145)
Sodium: 137 mmol/L (ref 135–145)

## 2024-01-19 LAB — CBC
HCT: 27.3 % — ABNORMAL LOW (ref 36.0–46.0)
HCT: 26.6 % — ABNORMAL LOW (ref 36.0–46.0)
HCT: 23 % — ABNORMAL LOW (ref 36.0–46.0)
Hemoglobin: 8.9 g/dL — ABNORMAL LOW (ref 12.0–15.0)
Hemoglobin: 8.5 g/dL — ABNORMAL LOW (ref 12.0–15.0)
Hemoglobin: 7.5 g/dL — ABNORMAL LOW (ref 12.0–15.0)
MCH: 25.9 pg — ABNORMAL LOW (ref 26.0–34.0)
MCH: 26 pg (ref 26.0–34.0)
MCH: 26 pg (ref 26.0–34.0)
MCHC: 32.6 g/dL (ref 30.0–36.0)
MCHC: 32 g/dL (ref 30.0–36.0)
MCHC: 32.6 g/dL (ref 30.0–36.0)
MCV: 79.4 fL — ABNORMAL LOW (ref 80.0–100.0)
MCV: 81.3 fL (ref 80.0–100.0)
MCV: 79.6 fL — ABNORMAL LOW (ref 80.0–100.0)
Platelets: 349 K/uL (ref 150–400)
Platelets: 277 K/uL (ref 150–400)
Platelets: 233 K/uL (ref 150–400)
RBC: 3.44 MIL/uL — ABNORMAL LOW (ref 3.87–5.11)
RBC: 3.27 MIL/uL — ABNORMAL LOW (ref 3.87–5.11)
RBC: 2.89 MIL/uL — ABNORMAL LOW (ref 3.87–5.11)
RDW: 16.3 % — ABNORMAL HIGH (ref 11.5–15.5)
RDW: 16.5 % — ABNORMAL HIGH (ref 11.5–15.5)
RDW: 16 % — ABNORMAL HIGH (ref 11.5–15.5)
WBC: 9.8 K/uL (ref 4.0–10.5)
WBC: 8.2 K/uL (ref 4.0–10.5)
WBC: 6.3 K/uL (ref 4.0–10.5)
nRBC: 0 % (ref 0.0–0.2)
nRBC: 0 % (ref 0.0–0.2)
nRBC: 0 % (ref 0.0–0.2)

## 2024-01-19 LAB — BETA-HYDROXYBUTYRIC ACID: Beta-Hydroxybutyric Acid: 0.45 mmol/L — ABNORMAL HIGH (ref 0.05–0.27)

## 2024-01-19 LAB — GLUCOSE, CAPILLARY
Glucose-Capillary: 132 mg/dL — ABNORMAL HIGH (ref 70–99)
Glucose-Capillary: 137 mg/dL — ABNORMAL HIGH (ref 70–99)

## 2024-01-19 LAB — TROPONIN T, HIGH SENSITIVITY
Troponin T High Sensitivity: 41 ng/L — ABNORMAL HIGH (ref 0–19)
Troponin T High Sensitivity: 47 ng/L — ABNORMAL HIGH (ref 0–19)
Troponin T High Sensitivity: 51 ng/L — ABNORMAL HIGH (ref 0–19)

## 2024-01-19 LAB — LACTIC ACID, PLASMA
Lactic Acid, Venous: 1.8 mmol/L (ref 0.5–1.9)
Lactic Acid, Venous: 1.6 mmol/L (ref 0.5–1.9)

## 2024-01-19 MED ORDER — AMLODIPINE BESYLATE 10 MG PO TABS
10.0000 mg | ORAL_TABLET | Freq: Every day | ORAL | Status: DC
  Administered 2024-01-19 – 2024-01-24 (×6): 10 mg via ORAL
  Filled 2024-01-19: qty 2
  Filled 2024-01-19 (×5): qty 1

## 2024-01-19 MED ORDER — LABETALOL HCL 5 MG/ML IV SOLN
0.5000 mg/min | Status: DC
  Filled 2024-01-19: qty 80

## 2024-01-19 MED ORDER — INSULIN ASPART 100 UNIT/ML IJ SOLN
0.0000 [IU] | Freq: Three times a day (TID) | INTRAMUSCULAR | Status: DC
  Administered 2024-01-21: 3 [IU] via SUBCUTANEOUS
  Administered 2024-01-22 – 2024-01-24 (×2): 1 [IU] via SUBCUTANEOUS
  Filled 2024-01-19: qty 3
  Filled 2024-01-19 (×2): qty 1

## 2024-01-19 MED ORDER — PANTOPRAZOLE SODIUM 40 MG IV SOLR
40.0000 mg | Freq: Once | INTRAVENOUS | Status: AC
  Administered 2024-01-19: 40 mg via INTRAVENOUS
  Filled 2024-01-19: qty 10

## 2024-01-19 MED ORDER — AMITRIPTYLINE HCL 10 MG PO TABS
10.0000 mg | ORAL_TABLET | Freq: Every day | ORAL | Status: DC
  Administered 2024-01-19 – 2024-01-23 (×4): 10 mg via ORAL
  Filled 2024-01-19 (×6): qty 1

## 2024-01-19 MED ORDER — PANTOPRAZOLE SODIUM 40 MG IV SOLR
40.0000 mg | Freq: Every day | INTRAVENOUS | Status: DC
  Administered 2024-01-19 – 2024-01-21 (×3): 40 mg via INTRAVENOUS
  Filled 2024-01-19 (×3): qty 10

## 2024-01-19 MED ORDER — HYDRALAZINE HCL 50 MG PO TABS
50.0000 mg | ORAL_TABLET | Freq: Three times a day (TID) | ORAL | Status: DC
  Administered 2024-01-19 (×2): 50 mg via ORAL
  Filled 2024-01-19 (×2): qty 1

## 2024-01-19 MED ORDER — STERILE WATER FOR INJECTION IV SOLN
INTRAVENOUS | Status: AC
  Filled 2024-01-19: qty 1000

## 2024-01-19 MED ORDER — CAPSAICIN 0.075 % EX CREA
TOPICAL_CREAM | Freq: Two times a day (BID) | CUTANEOUS | Status: DC
  Filled 2024-01-19 (×2): qty 57

## 2024-01-19 MED ORDER — LORAZEPAM 2 MG/ML IJ SOLN
1.0000 mg | Freq: Four times a day (QID) | INTRAMUSCULAR | Status: DC | PRN
  Administered 2024-01-19 – 2024-01-20 (×5): 1 mg via INTRAVENOUS
  Filled 2024-01-19 (×5): qty 1

## 2024-01-19 MED ORDER — METOCLOPRAMIDE HCL 5 MG/ML IJ SOLN
10.0000 mg | INTRAMUSCULAR | Status: AC
  Administered 2024-01-19: 10 mg via INTRAVENOUS
  Filled 2024-01-19: qty 2

## 2024-01-19 MED ORDER — HEPARIN SODIUM (PORCINE) 5000 UNIT/ML IJ SOLN
5000.0000 [IU] | Freq: Three times a day (TID) | INTRAMUSCULAR | Status: DC
  Administered 2024-01-19 (×3): 5000 [IU] via SUBCUTANEOUS
  Filled 2024-01-19 (×4): qty 1

## 2024-01-19 MED ORDER — LABETALOL HCL 5 MG/ML IV SOLN: 5.0000 mg | INTRAVENOUS | Status: DC | PRN

## 2024-01-19 MED ORDER — SODIUM CHLORIDE 0.9 % IV SOLN: INTRAVENOUS | Status: AC

## 2024-01-19 MED ORDER — LABETALOL HCL 5 MG/ML IV SOLN
0.5000 mg/min | Status: DC
  Administered 2024-01-19: 0.5 mg/min via INTRAVENOUS
  Filled 2024-01-19: qty 80

## 2024-01-19 MED ORDER — ACETAMINOPHEN 500 MG PO TABS
1000.0000 mg | ORAL_TABLET | Freq: Four times a day (QID) | ORAL | Status: DC | PRN
  Administered 2024-01-19 – 2024-01-20 (×3): 1000 mg via ORAL
  Filled 2024-01-19 (×3): qty 2

## 2024-01-19 NOTE — H&P (Addendum)
 " Date: 01/19/2024               Patient Name:  [name] MRN: [id number]  DOB: 07/29/1980 Age / Sex: 44 y.o., female   PCP: Edgardo Pontiff, DO         Medical Service: Internal Medicine Teaching Service         Attending Physician: Dr. Reyes Fenton      First Contact: Sallyanne Primas, DO    Second Contact: Dr. Lonni Africa, DO         Pager Information: First Contact Pager: [phone]   Second Contact Pager: [phone]   SUBJECTIVE   Chief Complaint: Abdominal Pain, N/V  History of Present Illness - limited due to patient's active vomiting  [name] is a 44 y.o. 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 The patient states that her symptoms started abruptly and woke her from her sleep. She describes her abdominal pain as a 10/10 constant, cramping and states her nausea/vomiting has been unrelenting. Her vomitus is a dark color. She has been unable to keep any food or liquids down since the onset of her symptoms. The last good PO intake she had was the day prior to her symptoms. The patient has several antihypertensives but does not take them because she thinks they are making her sick.  The patient denies chest pain or SOB. Per EDP, the patient stated that she also had loose stools. Of note, the patient has had several inpatient admissions for intractable nausea/vomiting due to cannabinoid hyperemesis syndrome. Most recently in late October 2025. The patient states that she has cut her marijuana smoking from all day to twice daily. She struggles to achieve complete cessation because of her anxiety. She has previously trialed Lexapro  for depression and anxiety but this was discontinued and not on her current medication list.  ED Course: Labs:  Bicarb 9, AG 23, Lipase 153, Hgb 9.5, Troponin 41 Imaging: CXR wnl, CT abdomen showing esophagitis, CTH negative Consults: None Management: NS 500cc bolus, IM Versed  5mg , IV hydralazine  10 mg, PO Zofran  4 mg, IV Reglan  10 mg, IV Dilaudid  0.5 mg    Meds  Amlodipine  10 mg daily - not taking Calcium  carbonate 200 mg 3 times daily - unclear if taking Hydralazine  100 mg 3 times daily - not taking Imdur  60 mg daily - not taking Metoprolol  succinate 50 mg daily - not taking Zofran  4 mg as needed for nausea or vomiting - unclear if taking Protonix  40 mg daily - unclear if taking Sodium bicarbonate  1300 mg twice daily - currently taking  Allergies  Allergies as of 01/18/2024 - Review Complete 01/18/2024  Allergen Reaction Noted   Shellfish allergy Anaphylaxis and Swelling 04/05/2012   Morphine  and codeine Nausea And Vomiting 08/03/2010   Past Medical History T2DM CKD 5 not on HD Cannabinoid hyperemesis syndrome Hypertension Depression Asthma Anemia of CKD Cocaine use disorder (in remission)  Past Surgical History Past Surgical History:  Procedure Laterality Date   ESOPHAGOGASTRODUODENOSCOPY (EGD) WITH PROPOFOL  N/A 08/07/2019   Procedure: ESOPHAGOGASTRODUODENOSCOPY (EGD) WITH PROPOFOL ;  Surgeon: Abran Norleen SAILOR, MD;  Location: [facility] ENDOSCOPY;  Service: Endoscopy;  Laterality: N/A;   NO PAST SURGERIES     TUBAL LIGATION  06/20/2011   Procedure: POST PARTUM TUBAL LIGATION;  Surgeon: Winton Felt, MD;  Location: [facility] ORS;  Service: Gynecology;  Laterality: Bilateral;  Bilateral post partum tubal ligation     Social History - limited due to patient's clinical status 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   OBJECTIVE:   Physical Exam: Blood pressure (!) 201/97, pulse (!) 113, temperature 98 F (36.7 C), temperature source Oral, resp. rate 19, height 5' 7 (1.702 m), weight 71.9 kg, SpO2 100%.  Constitutional: Actively vomiting during exam HENT: normocephalic atraumatic, mucous membranes moist Eyes: conjunctiva non-erythematous, PERRL, no scleral icterus Cardiovascular: regular rate and rhythm, no m/r/g Pulmonary/Chest: normal work of breathing on room air, lungs clear to auscultation bilaterally Abdominal: patient deferred due to actively vomiting Neurological: alert & oriented x3, moving all extremities equally Skin: warm and dry Extremities: no edema or cyanosis; peripheral pulses intact Psych: anxious mood and affect, thought content normal  Labs: CBC    Component Value Date/Time   WBC 7.2 01/18/2024 1626   RBC 3.67 (L) 01/18/2024 1626   HGB 10.9 (L) 01/18/2024 1644   HGB 8.5 (L) 08/31/2022 1620   HCT 32.0 (L) 01/18/2024 1644   HCT 29.2 (L) 08/31/2022 1620   PLT 221 01/18/2024 1626   PLT 428 08/31/2022 1620   MCV 85.3 01/18/2024 1626   MCV 77 (L) 08/31/2022 1620   MCH 25.9 (L) 01/18/2024 1626   MCHC 30.4 01/18/2024 1626   RDW 16.4 (H) 01/18/2024 1626   RDW 22.3 (H) 08/31/2022 1620   LYMPHSABS 0.7 01/18/2024 1626   MONOABS 0.1 01/18/2024 1626   EOSABS 0.0 01/18/2024 1626   BASOSABS 0.0 01/18/2024 1626     CMP     Component Value Date/Time   NA 141 01/18/2024 1644   NA 136 09/16/2022 1445   K 4.6 01/18/2024 1644   CL 115 (H) 01/18/2024 1644   CO2 9 (L) 01/18/2024 1626   GLUCOSE 194 (H) 01/18/2024 1644   BUN 56 (H) 01/18/2024 1644   BUN 40 (H) 09/16/2022 1445   CREATININE 8.40 (H) 01/18/2024 1644   CREATININE 1.24 (H) 06/17/2011 1051   CALCIUM  8.2 (L) 01/18/2024 1626   PROT 8.4 (H) 01/18/2024 1626   PROT 6.8 09/16/2022  1445   ALBUMIN 4.7 01/18/2024 1626   ALBUMIN 3.8 (L) 09/16/2022 1445   AST 26 01/18/2024 1626   ALT 14 01/18/2024 1626   ALKPHOS 96 01/18/2024 1626   BILITOT 0.4 01/18/2024 1626   BILITOT <0.2 09/16/2022 1445   GFRNONAA 6 (L) 01/18/2024 1626   GFRAA 22 (L) 08/08/2019 0314    Imaging: DG Chest Port 1 View Result Date: 01/18/2024 EXAM: 1 VIEW(S) XRAY OF THE CHEST 01/18/2024 11:27:16 PM COMPARISON: 11/13/2023 CLINICAL HISTORY: HTN FINDINGS: LUNGS AND PLEURA: No focal pulmonary opacity. No pleural effusion. No pneumothorax. HEART AND MEDIASTINUM: No acute abnormality of the cardiac and mediastinal silhouettes. BONES AND SOFT TISSUES: No acute osseous abnormality. IMPRESSION: 1. No acute cardiopulmonary process. Electronically signed by: Greig Pique MD 01/18/2024 11:58 PM EST RP Workstation: [id number]   CT ABDOMEN PELVIS WO CONTRAST  Result Date: 01/18/2024 EXAM: CT ABDOMEN AND PELVIS WITHOUT CONTRAST 01/18/2024 11:14:56 PM TECHNIQUE: CT of the abdomen and pelvis was performed without the administration of intravenous contrast. Multiplanar reformatted images are provided for review. Automated exposure control, iterative reconstruction, and/or weight-based adjustment of the mA/kV was utilized to reduce the radiation dose to as low as reasonably achievable. COMPARISON: CT chest abdomen and pelvis 11/13/2023. CLINICAL HISTORY: Abdominal pain, acute, nonlocalized. FINDINGS: LOWER CHEST: No acute abnormality. LIVER: The liver is unremarkable. GALLBLADDER AND BILE DUCTS: Gallbladder is unremarkable. No biliary ductal dilatation. SPLEEN: No acute abnormality. PANCREAS: No acute abnormality. ADRENAL GLANDS: No acute abnormality. KIDNEYS, URETERS AND BLADDER: No stones in the kidneys or ureters. No hydronephrosis. No perinephric or periureteral stranding. Urinary bladder is unremarkable. GI AND BOWEL: Stomach demonstrates no acute abnormality. There is wall thickening of the distal esophagus. The appendix  appears normal. There is no bowel obstruction. PERITONEUM AND RETROPERITONEUM: No ascites. No free air. VASCULATURE: Aorta is normal in caliber. LYMPH NODES: No lymphadenopathy. REPRODUCTIVE ORGANS: No acute abnormality. BONES AND SOFT TISSUES: No acute osseous abnormality. No focal soft tissue abnormality. IMPRESSION: 1. Wall thickening of the distal esophagus worrisome for esophagitis . 2. Normal appendix. Electronically signed by: Greig Pique MD 01/18/2024 11:56 PM EST RP Workstation: [id number]   CT Head Wo Contrast Result Date: 01/18/2024 EXAM: CT HEAD WITHOUT CONTRAST 01/18/2024 10:47:30 PM TECHNIQUE: CT of the head was performed without the administration of intravenous contrast. Automated exposure control, iterative reconstruction, and/or weight based adjustment of the mA/kV was utilized to reduce the radiation dose to as low as reasonably achievable. COMPARISON: 09/22/2020 CLINICAL HISTORY: Neuro deficit, acute, stroke suspected. FINDINGS: BRAIN AND VENTRICLES: No acute hemorrhage. No evidence of acute infarct. No hydrocephalus. No extra-axial collection. No mass effect or midline shift. ORBITS: No acute abnormality. SINUSES: No acute abnormality. SOFT TISSUES AND SKULL: No acute soft tissue abnormality. No skull fracture. IMPRESSION: 1. No acute intracranial abnormality. Electronically signed by: Franky Stanford MD 01/18/2024 11:08 PM EST RP Workstation: [id number]    EKG: personally reviewed my interpretation is NSR which is consistent with prior.  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  #Cannabinoid hyperemesis syndrome #Abdominal Pain #Esophagitis Previously admitted to [facility] in late October for cannabinoid hyperemesis syndrome. Has several admissions for the same prior to that. Acute-onset intractable abdominal pain and N/V in the setting of continued marijuana use is concerning for the same. Patient states she has cut back on her marijuana use but struggles to achieve complete cessation due to consistent anxiety. Suspect that further treatment of her anxiety will allow her to achieve further cessation of marijuana use. Per chart review, has trialed Lexapro  but this is not on current med list. Elevation in lipase to 153 may be related to advanced kidney disease given lack of pancreatitis imaging findings on CT abdomen. Abdominal pain more likely in the setting of retching and continued vomiting especially with imaging findings of esophagitis. Will keep NPO and offer capsicum cream cream for nausea. PO challenge once nausea better controlled. - Keep NPO -  Nausea/Vomiting regimen: - Capsicum cream for nausea/vomiting - IV Ativan  1 mg q6h PRN for anxiety and nausea - IV Protonix  40 mg daily for esophagitis  #Hypertensive Urgency BP of 220/110s upon presentation to the ED. Has not been taking her antihypertensives for the past few months. Currently prescribed Imdur  60 mg daily, Hydralazine  100mg  tid, Toprol -XL 50mg  daily. Suspect that BP elevated due to medication non-adherence and stress response from actively vomiting. Received IV hydralazine  from EDP which only mildly improved BP. Given patient's intractable N/V and inability to tolerate PO meds, will start labetalol  infusion. Prior history of asthma exacerbation 10 years ago but discussed case with cardiology who believes benefits of labetalol  infusion outweigh risks. - IV Labetalol  infusion 0.5-3 mg/min with goal of < 180/105 - Hold home BP meds while  NPO  #CKD5 #AGMA GFR of 6 of admission. Not on dialysis. Recently saw vascular surgery for AV fistula planning as she will likely need dialysis in the near future. BUN, Cr, K stable . Bicarb of 9 with elevated AG to 23. Delta ratio (8/15) which indicates possible concomitant NAGMA. Possibly due to starvation ketosis given ketonuria and poor PO intake. Less likely to be DKA given diabetes is type 2 and no current medications that may cause euglycemic DKA but will check beta hydroxybutyrate. Patient was supposed to see nephrology in outpatient setting after last admission but was lost to follow up. Takes sodium bicarb 1300 mg bid as home medication. Consulted nephrology for recommendations regarding acidosis and potential dialysis needs. Nephrology to see in the AM.  - Nephrology consulted, appreciate recs  - Sodium bicarb infusion 125 ml/hr while NPO  - Hold home sodium bicarb - Repeat BMP pending - Lactic acid pending - Beta hydroxybutyrate pending  #[place] Rates up to 140s. No active chest pain. Prior EKG showing NSR. Tachycardia likely in the setting of anxiety and volume depletion from active vomiting. Will continue monitoring rate but suspect this will improve with better control of nausea.  #Elevated Troponin Initial troponin elevated to 41. Patient denies chest pain. Likely in the setting of CKD. No ST/T wave abnormalities on EKG. Will continue to monitor with repeat troponin - Repeat troponin pending  #Anemia of CKD Actively vomiting dark liquid, but Hgb remains stable at 9.5 which appears at baseline. Previously treated with EPO but not currently taking. Esophagitis seen on imaging likely in the setting of active vomiting. Will continue to monitor.  - Trend CBCs  #T2DM Last A1c of 5.5%. Well-controlled with lifestyle changes. Not on any antihyperglycemics. No acute concerns. - Sensitive SSI  Chronic Stable Medical Conditions  #Asthma #Depression   Best practice: Diet:  NPO VTE: Heparin  IVF: None,None Code: Full  Disposition planning: Prior to Admission Living Arrangement: Home Anticipated Discharge Location: Home  Dispo: Admit patient to Observation with expected length of stay less than 2 midnights.  Signed: Ernie Sagrero, MD [facility]  PGY-1 01/19/2024, 2:45 AM  Please contact IM Residency On-Call Pager at: [phone] or [phone].  "

## 2024-01-19 NOTE — Hospital Course (Addendum)
 Cannabinoid hyperemesis syndrome Esophagitis Patient presented with intractable abdominal pain, nausea, vomiting.  Symptoms had started abruptly prior to her admission.  Her symptoms were likely due to her continued marijuana use.  Patient has had multiple admissions for the same with the last admission being in October 2025.  She was initially treated with capsaicin  cream, Ativan  as needed.  Patient reported that Ativan  had helped her in the past.  Discontinued Ativan  and started her on amitriptyline  for long-term management and prevention of CHS.  Patient continued to improve with symptoms of N/V/abdominal pain completely resolving.  She was in good condition on discharge.  Discussed with her the importance of marijuana cessation upon discharge.  She stated understanding. - Amitriptyline  10 mg daily at bedtime -P.o. 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  She had previously seen vascular surgery for AV fistula planning with likely need for dialysis.  Nephrology was consulted.  She underwent AV graft placement along with left-sided TDC on 01/20/2023.  Patient underwent her first HD on 01/21/2024 with a repeat on 01/23/2024.  She tolerated both the sessions well.  Per vascular surgery, her left-sided AV fistula could be used in 4 weeks.  Patient was discharged after CLIP placement with [facility].  Informed her that next HD at [facility] would be on tomorrow 01/25/24.  Discussed with patient it was important for her to be adherent to her HD which was scheduled for MWF.  Patient stated understanding.  Patient was provided with disability resources and a work note requesting a 7-day leave of work upon discharge.  HTN urgency, resolved Patient came in with a BP of 220/110s in the ED.  Reported she had not been taking her antihypertensive for the past few months.  Her blood pressure is likely  elevated due to medication nonadherence and stress response from active vomiting.  She received IV hydralazine  from ED PA which only mildly improved BP.  Patient's BP continued to improve throughout her admission and she was started on home 10 mg amlodipine  and Imdur .  Discontinued Imdur  upon discharge and started her on carvedilol . - Continue amlodipine  10 mg daily -Start taking carvedilol  25 mg twice daily  Hypocalcemia Vitamin D  deficiency Likely due to chronic kidney disease.  Patient denied any symptoms of numbness, tingling, muscle cramps.  Vitamin D  hydroxy levels were low at 7.3. - F/U PTH - Calcitriol  0.25 mcg MWF with HD - Calcium  carbonate 100 mg twice daily  Anemia of CKD Normocytic normochromic anemia Iron  studies were as follows: Iron  121, TIBC 246, saturation 49%, ferritin 181; patient underwent transfusion with 1 packed RBC on 01/21/2024 for low hemoglobin levels.  Patient received IV Venofer  200 mg for 2 doses. -Monitor CBC regularly

## 2024-01-19 NOTE — ED Provider Notes (Signed)
" °  Assumed care at shift change.  See prior notes for full H&P.  Briefly, 44 y.o. F here with myriad of issues.  Uncontrolled BP, abdominal pain, vomiting, etc.  She has seen vascular for HD access, however has not followed up with nephrology about this.  Unclear her wishes at this time regarding HD.  Was given hydralazine  for BP.   Plan:  repeat hydralazine  for BP, antiemetics.  Pending CT abdomen pelvis and troponin.  1:04 AM Notified by RN that patient continues to vomit here.  BP only transiently improves then elevates again.  At most recent PCP visit she was around 180/90 so does seem that far off from her baseline.  Trop 41, suspect likely due to her CKD.  EKG without acute ischemia.  CT does reveal esophagitis.  Will add on IV Protonix , give Reglan  which has apparently worked well for her before.  Will discuss with internal medicine teaching service for admission.  Spoke with IMTS resident, they will admit for ongoing management.   Jarold Olam HERO, PA-C 01/19/24 0228    Haze Lonni PARAS, MD 01/19/24 [phone]  "

## 2024-01-19 NOTE — Progress Notes (Signed)
 "  HD#0 SUBJECTIVE:  Patient Summary: [name] is a 44 y.o. 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   Overnight Events: Admitted overnight  Interim History: Patient was seen bedside.  She was not vomiting and stated that the last time that she vomited was prior to receiving Ativan  and antinausea medication earlier in the night.  She reports no current nausea.  Stated that her mouth feels very dry and ice chips are not helping.  She was provided with water  bedside and drink 1 cup without problems.  Feels tired today. Denies any pain right now. Denies headaches. She did have some blurry vision but this has resolved. Denies myalgias, chest pain, shortness of breath, swelling. She reports that she has been urinating and having appropriate bowel movements. She is amenable to trying another medication for her blood pressure or starting amitriptyline .  OBJECTIVE:  Vital Signs: Vitals:   01/19/24 0545 01/19/24 0615 01/19/24 0800 01/19/24 0812  BP: (!) 176/92 (!) 166/86 (!) 193/100   Pulse: 85 94 89   Resp: 16 17 14    Temp:    99.6 F (37.6 C)  TempSrc:    Oral  SpO2: 100% 100% 100%   Weight:      Height:       Supplemental O2: Room Air SpO2: 100 %  Filed Weights   01/18/24 1551  Weight: 71.9 kg    No intake or output data in the 24 hours ending 01/19/24 0844 Net IO Since Admission: No IO data has been entered for this period [01/19/24 0844]  Physical Exam: Physical Exam Constitutional:      Appearance: She is ill-appearing. She is not diaphoretic.  Cardiovascular:     Rate and Rhythm: Normal rate and regular rhythm.     Heart sounds: Normal heart sounds. No murmur heard.    No friction rub. No gallop.  Pulmonary:     Effort:  Pulmonary effort is normal.     Breath sounds: Normal breath sounds. No stridor. No wheezing, rhonchi or rales.  Abdominal:     General: Abdomen is flat. Bowel sounds are normal.     Palpations: Abdomen is soft.     Tenderness: There is no abdominal tenderness.  Musculoskeletal:     Right lower leg: No edema.     Left lower leg: No edema.  Skin:    General: Skin is warm and dry.  Neurological:     Mental Status: She is alert.     Patient Lines/Drains/Airways Status     Active Line/Drains/Airways     Name Placement date Placement time Site Days   Peripheral IV 01/19/24 22 G Posterior;Right Hand 01/19/24  0252  Hand  less than 1   Peripheral IV 01/19/24 20 G 2.5 Left;Medial Antecubital 01/19/24  0303  Antecubital  less than 1            Pertinent labs and imaging:      Latest Ref Rng & Units 01/19/2024    5:10 AM 01/19/2024    2:56 AM 01/18/2024    4:44 PM  CBC  WBC 4.0 - 10.5 K/uL 8.2  9.8    Hemoglobin 12.0 - 15.0 g/dL 8.5  8.9  89.0   Hematocrit 36.0 - 46.0 % 26.6  27.3  32.0   Platelets  150 - 400 K/uL 277  349         Latest Ref Rng & Units 01/19/2024    2:56 AM 01/18/2024    4:44 PM 01/18/2024    4:26 PM  CMP  Glucose 70 - 99 mg/dL 797  805  804   BUN 6 - 20 mg/dL 57  56  51   Creatinine 0.44 - 1.00 mg/dL 2.62  1.59  2.55   Sodium 135 - 145 mmol/L 141  141  139   Potassium 3.5 - 5.1 mmol/L 4.5  4.6  4.9   Chloride 98 - 111 mmol/L 107  115  106   CO2 22 - 32 mmol/L 15   9   Calcium  8.9 - 10.3 mg/dL 8.3   8.2   Total Protein 6.5 - 8.1 g/dL   8.4   Total Bilirubin 0.0 - 1.2 mg/dL   0.4   Alkaline Phos 38 - 126 U/L   96   AST 15 - 41 U/L   26   ALT 0 - 44 U/L   14     DG Chest Port 1 View Result Date: 01/18/2024 EXAM: 1 VIEW(S) XRAY OF THE CHEST 01/18/2024 11:27:16 PM COMPARISON: 11/13/2023 CLINICAL HISTORY: HTN FINDINGS: LUNGS AND PLEURA: No focal pulmonary opacity. No pleural effusion. No pneumothorax. HEART AND MEDIASTINUM: No acute abnormality of the  cardiac and mediastinal silhouettes. BONES AND SOFT TISSUES: No acute osseous abnormality. IMPRESSION: 1. No acute cardiopulmonary process. Electronically signed by: Greig Pique MD 01/18/2024 11:58 PM EST RP Workstation: [id number]   CT ABDOMEN PELVIS WO CONTRAST Result Date: 01/18/2024 EXAM: CT ABDOMEN AND PELVIS WITHOUT CONTRAST 01/18/2024 11:14:56 PM TECHNIQUE: CT of the abdomen and pelvis was performed without the administration of intravenous contrast. Multiplanar reformatted images are provided for review. Automated exposure control, iterative reconstruction, and/or weight-based adjustment of the mA/kV was utilized to reduce the radiation dose to as low as reasonably achievable. COMPARISON: CT chest abdomen and pelvis 11/13/2023. CLINICAL HISTORY: Abdominal pain, acute, nonlocalized. FINDINGS: LOWER CHEST: No acute abnormality. LIVER: The liver is unremarkable. GALLBLADDER AND BILE DUCTS: Gallbladder is unremarkable. No biliary ductal dilatation. SPLEEN: No acute abnormality. PANCREAS: No acute abnormality. ADRENAL GLANDS: No acute abnormality. KIDNEYS, URETERS AND BLADDER: No stones in the kidneys or ureters. No hydronephrosis. No perinephric or periureteral stranding. Urinary bladder is unremarkable. GI AND BOWEL: Stomach demonstrates no acute abnormality. There is wall thickening of the distal esophagus. The appendix appears normal. There is no bowel obstruction. PERITONEUM AND RETROPERITONEUM: No ascites. No free air. VASCULATURE: Aorta is normal in caliber. LYMPH NODES: No lymphadenopathy. REPRODUCTIVE ORGANS: No acute abnormality. BONES AND SOFT TISSUES: No acute osseous abnormality. No focal soft tissue abnormality. IMPRESSION: 1. Wall thickening of the distal esophagus worrisome for esophagitis . 2. Normal appendix. Electronically signed by: Greig Pique MD 01/18/2024 11:56 PM EST RP Workstation: [id number]   CT Head Wo Contrast Result Date: 01/18/2024 EXAM: CT HEAD WITHOUT CONTRAST 01/18/2024  10:47:30 PM TECHNIQUE: CT of the head was performed without the administration of intravenous contrast. Automated exposure control, iterative reconstruction, and/or weight based adjustment of the mA/kV was utilized to reduce the radiation dose to as low as reasonably achievable. COMPARISON: 09/22/2020 CLINICAL HISTORY: Neuro deficit, acute, stroke suspected. FINDINGS: BRAIN AND VENTRICLES: No acute hemorrhage. No evidence of acute infarct. No hydrocephalus. No extra-axial collection. No mass effect or midline shift. ORBITS: No acute abnormality. SINUSES: No acute abnormality. SOFT TISSUES AND SKULL: No acute soft tissue abnormality. No skull fracture. IMPRESSION:  1. No acute intracranial abnormality. Electronically signed by: Franky Stanford MD 01/18/2024 11:08 PM EST RP Workstation: [id number]    ASSESSMENT/PLAN:  Assessment: Principal Problem:   Cannabinoid hyperemesis syndrome Active Problems:   Type 2 diabetes mellitus (HCC)   Hypertensive urgency   Increased anion gap metabolic acidosis   Plan: #Cannabinoid hyperemesis syndrome #Abdominal Pain #Esophagitis Evaluation today, patient was not vomiting.  Denied any current nausea.  Tolerating water .  And understanding that cannabis is causing her nausea and vomiting.  Previously tried Lexapro , but not currently taking.  Is amenable to starting amitriptyline  to help with cessation and anxiety/depression.  Nephrology consulted for CKD 5 and NAGMA. Uremia could be contributing to nausea, plans for HD this admission. - Progress diet as tolerated - Capsaicin  cream for nausea/vomiting - Ativan  1 mg IV every 6 hours as needed for anxiety and nausea - IV Protonix  40 mg daily for esophagitis - Consider amitriptyline  25 mg daily  #Hypertensive Urgency Blood pressure has improved since being on the labetalol  drip.  Current blood pressure 175/88.  There were concerns given history of asthma with labetalol , however patient denies shortness of breath or  respiratory concerns at this time. Patient denied any headache, changes in vision, chest pain, shortness of breath.  No signs of endorgan damage at this time.  Patient stated that she does not take her blood pressure medications at home because they make her sick.  Explained to patient the risks of uncontrolled blood pressure and patient stated understanding.  She is amenable to starting blood pressure medications in conjunction with amitriptyline  for anxiety/depression/cannabis sensation. It appears that her last office visit 11/21/2023 medication management was recommended: Hydralazine  100 mg 3 times daily, Imdur  60 mg daily, amlodipine  10 mg, metoprolol  succinate 50 mg.  Due to medication nonadherence, we could trial patient on just 1-2 blood pressure medications with close follow-up outpatient and focus on symptom management of nausea/vomiting to promote medication adherence overall.  Patient will be initiating dialysis during this hospitalization.  - Continue IV labetalol  infusion with goal <180/105 - Holding home BP medications  - Consider starting losartan 25 mg daily  #CKD5 #AGMA GFR of 6 on admission.  Not on dialysis.  No lactic acidosis.  Anion gap 19, CO2 15 after sodium bicarb drip.  BUN 57, creatinine 7.37.  Beta hydroxybutyrate elevated, but given improvement with sodium bicarb and medical history still more likely starvation ketosis.  Euvolemic on exam. - Nephrology is consulted, appreciate recommendations  - Patient agreeable to initiate hemodialysis during admission. - [facility] consulted for access.  Plans for tunneled dialysis catheter with IR on Friday 1/2.  Added to schedule for 1/5 for long-term HD access.  - Daily weights, daily renal panel, strict I's/O's, avoid nephrotoxic meds - 0.9% sodium bicarb continuous infusion ordered for 100 cc/HR for 10 hours - BMP ordered for later this afternoon  #[place]ach[place]ia Resolved.  Likely in setting of anxiety and volume depletion from active  vomiting.  #Elevated Troponin Troponin trend: 41->47->51->.  No complaints of chest pain.  No ST/T wave abnormalities on EKG during admission.  Likely in setting of CKD  #Anemia of CKD During admission, was vomiting dark liquid.  Hemoglobin appears at baseline (8.9->8.5).  Esophagitis was seen on imaging.  Will continue trending CBC. - CBC ordered for afternoon  #T2DM A1C 5.5% -Sensitive SSI  Chronic Stable Medical Conditions #Asthma #Depression  Best Practice: Diet: Clear liquid diet IVF: Fluids: 0.9% sodium chloride , Rate: 100 cc/hr x 10 hrs VTE: heparin  injection 5,000  Units Start: 01/19/24 0145 Code: Full  Disposition planning: Therapy Recs: None, DME: none Family Contact: Norleen Dross, to be notified. DISPO: Anticipated discharge to Home pending clinical improvement.  Signature:  Sallyanne Benuel [facility]  8:44 AM, 01/19/2024  On Call pager [phone]  "

## 2024-01-19 NOTE — ED Notes (Signed)
 Patient asleep, resting and easily aroused.

## 2024-01-19 NOTE — ED Notes (Signed)
 Patient complaining of lower back pain 8/10 restless and becoming anxious. Requesting pain medication. Administered 1mg  Ativan  that immediately calmed patient.

## 2024-01-19 NOTE — Consult Note (Signed)
 [name] Admit Date: 01/18/2024 01/19/2024 [name] Requesting Physician:  Eben MD  Reason for Consult:  Uremia, CKD5, N/V HPI:  40F PMH including longstanding CKD 5 followed by [facility], longstanding poorly controlled DM2, hypertension with poor medication adherence, chronic/recurrent nausea/vomiting partially related to hyperemesis syndrome from cannabinoids who presented overnight to the ED with abdominal pain and recurrent nausea vomiting.  Blood pressure was very elevated at presentation and patient had not been using hypertensives at home.  In ED lab notable for creatinine of 7.4 with estimated GFR of 6, K4.9, bicarbonate of 9 with anion gap 20 and albumin of 4.7.  Patient placed on sodium bicarbonate  drip and IV labetalol  because of inability to take p.o.  This morning creatinine is unchanged, bicarbonate is up to 15 and anion gap is down to 19.  Patient last seen [facility] on 11/16/2023 and was informed that she was nearing need for dialysis and was rereferred back to [facility] where she was seen on 11/24/2023 by Dr. Silver and AV graft was needed but she was not yet willing to schedule.  At the time my evaluation the patient endorses ongoing anorexia, abdominal pain, nausea.  She has no asterixis.  Blood pressures are somewhat improved.  She is on room air.   Creat (mg/dL)  Date Value  94/69/7986 1.24 (H)   Creatinine, Ser (mg/dL)  Date Value  98/98/7973 7.37 (H)  01/18/2024 8.40 (H)  01/18/2024 7.44 (H)  11/14/2023 7.54 (H)  11/13/2023 7.27 (H)  01/25/2023 7.31 (H)  01/23/2023 5.74 (H)  01/21/2023 5.59 (H)  01/21/2023 5.35 (H)  12/29/2022 5.63 (H)  ]  ROS NSAIDS: No exposure IV Contrast no exposure TMP/SMX no exposure Hypotension NA Balance of 12 systems is negative w/ exceptions as above  PMH  Past Medical History:  Diagnosis Date   Anxiety    Asthma    Chronic kidney disease    Depression    Diabetes mellitus    Hyperemesis arising during pregnancy     Hypertension    Mental disorder    PSH  Past Surgical History:  Procedure Laterality Date   ESOPHAGOGASTRODUODENOSCOPY (EGD) WITH PROPOFOL  N/A 08/07/2019   Procedure: ESOPHAGOGASTRODUODENOSCOPY (EGD) WITH PROPOFOL ;  Surgeon: Abran Norleen SAILOR, MD;  Location: [facility] ENDOSCOPY;  Service: Endoscopy;  Laterality: N/A;   NO PAST SURGERIES     TUBAL LIGATION  06/20/2011   Procedure: POST PARTUM TUBAL LIGATION;  Surgeon: Winton Felt, MD;  Location: [facility] ORS;  Service: Gynecology;  Laterality: Bilateral;  Bilateral post partum tubal ligation   FH  Family History  Problem Relation Age of Onset   Heart disease Mother        CHF   Diabetes Sister    Anesthesia problems Neg Hx    SH  reports that she has never smoked. She has never used smokeless tobacco. She reports current drug use. Drug: Marijuana. She reports that she does not drink alcohol. Allergies Allergies[1] Home medications Prior to Admission medications  Medication Sig Start Date End Date Taking? Authorizing Provider  amLODipine  (NORVASC ) 10 MG tablet Take 1 tablet (10 mg total) by mouth daily. Patient not taking: Reported on 01/19/2024 11/21/23 11/20/24  Amilibia, Jaden, DO  calcium  carbonate (TUMS - DOSED IN MG ELEMENTAL CALCIUM ) 500 MG chewable tablet Chew 1 tablet (200 mg of elemental calcium  total) by mouth 3 (three) times daily between meals. Patient not taking: Reported on 01/19/2024 10/01/22   Marylu Gee, DO  hydrALAZINE  (APRESOLINE ) 100 MG tablet Take 1 tablet (  100 mg total) by mouth 3 (three) times daily. Patient not taking: Reported on 01/19/2024 11/21/23 12/21/23  Amilibia, Jaden, DO  isosorbide  mononitrate (IMDUR ) 60 MG 24 hr tablet Take 1 tablet (60 mg total) by mouth daily. Patient not taking: Reported on 01/19/2024 11/21/23 12/21/23  Amilibia, Jaden, DO  metoprolol  succinate (TOPROL -XL) 50 MG 24 hr tablet Take 1 tablet (50 mg total) by mouth daily. Take with or immediately following a meal. Patient not taking: Reported on 01/19/2024 11/21/23    Amilibia, Jaden, DO  ondansetron  (ZOFRAN ) 4 MG tablet Take 1 tablet (4 mg total) by mouth daily as needed for nausea or vomiting. Patient not taking: Reported on 01/19/2024 11/14/23 11/13/24  Edgardo Pontiff, DO  pantoprazole  (PROTONIX ) 40 MG tablet Take 1 tablet (40 mg total) by mouth daily. Patient not taking: Reported on 01/19/2024 11/14/23   Syeda, Raeeha, DO  sodium bicarbonate  650 MG tablet Take 2 tablets (1,300 mg total) by mouth 2 (two) times daily. Patient not taking: Reported on 01/19/2024 11/14/23 11/13/24  Edgardo Pontiff, DO    Current Medications Scheduled Meds:  capsicum   Topical BID   heparin   5,000 Units Subcutaneous Q8H   insulin  aspart  0-6 Units Subcutaneous TID WC   pantoprazole  (PROTONIX ) IV  40 mg Intravenous Daily   Continuous Infusions:  labetalol  (NORMODYNE ) infusion 5 mg/mL 0.5 mg/min (01/19/24 0300)   PRN Meds:.LORazepam   CBC Recent Labs  Lab 01/18/24 1626 01/18/24 1644 01/19/24 0256 01/19/24 0510  WBC 7.2  --  9.8 8.2  NEUTROABS 6.3  --   --   --   HGB 9.5* 10.9* 8.9* 8.5*  HCT 31.3* 32.0* 27.3* 26.6*  MCV 85.3  --  79.4* 81.3  PLT 221  --  349 277   Basic Metabolic Panel Recent Labs  Lab 01/18/24 1626 01/18/24 1644 01/19/24 0256  NA 139 141 141  K 4.9 4.6 4.5  CL 106 115* 107  CO2 9*  --  15*  GLUCOSE 195* 194* 202*  BUN 51* 56* 57*  CREATININE 7.44* 8.40* 7.37*  CALCIUM  8.2*  --  8.3*    Physical Exam  Blood pressure (!) 193/100, pulse 89, temperature 99.6 F (37.6 C), temperature source Oral, resp. rate 14, height 5' 7 (1.702 m), weight 71.9 kg, SpO2 100%. GEN: ill-appearing female. ENT: NCAT CV: No rub, normal S1 and S2 PULM: Clear bilaterally, normal work of breathing ABD: Soft, nontender SKIN: No rashes or lesions EXT: No peripheral edema  Assessment 51F presenting with hypertensive urgency, recurrent nausea/vomiting, uremia with advanced CKD 5.  CKD 5, uremic with nausea/vomiting and malaise: Patient agrees to initiate  hemodialysis during admission. Nausea/vomiting: Likely from ongoing marijuana with uremia contributing, Metabolic acidosis: Driven by advanced CKD and placed on sodium bicarbonate  drip at admission, c continue to monitor, will be addressed by dialysis Anemia, borderline microcytic.  TSAT 17% 11/16/2023 with ferritin 208 DM2 Hypertension with urgency due to nonadherence, placed on labetalol  IV, transition to oral medications as he will tolerate  Plan [facility] consulted for access, they can place AVG on Monday 1/5 not able to do [facility] on 1/2.  IR consulted for [facility] on 01/20/2024. Start HD tomorrow 1/2 after [facility] placed Will need CLIP tomorrow. Maybe to TCU?  Rep will need iron  and ESA based upon his resultseat Fe stores,  Daily weights, Daily Renal Panel, Strict I/Os, Avoid nephrotoxins (NSAIDs, judicious IV Contrast)    [name]  01/19/2024, 9:28 AM        [1]  Allergies Allergen Reactions   Shellfish Allergy Anaphylaxis and Swelling   Morphine  And Codeine Nausea And Vomiting

## 2024-01-19 NOTE — ED Notes (Signed)
 A few ice chips given and tolerated well.

## 2024-01-19 NOTE — Progress Notes (Signed)
 IR aware of request for tunneled HD catheter placement, will tentatively plan for procedure 1/2 with moderate sedation.  Orders placed for NPO at midnight. IR will see patient for consult/consent on 1/2.  Please call on call IR MD for questions or concerns.  Clotilda Hesselbach, PA-C

## 2024-01-19 NOTE — ED Notes (Signed)
 Pt laying on right side and BP is elevated. Pt asked to lay on back and BP retaken and was 163/88. Pt ambulated to restroom without assistance. Pt then returned to bed and laid on her right side again. Pt asked to stay on back if possible.

## 2024-01-19 NOTE — Progress Notes (Signed)
 TDC and AVG moved to tomorrow. Please make NPO midnight   Fonda FORBES Rim

## 2024-01-19 NOTE — TOC CM/SW Note (Signed)
 TOC consult received for substance abuse.   Patient may also need new outpt HD chair. Email sent to Renal CM.   Follow-up to be completed with patient as appropriate.   Merilee Batty, MSN, RN Case Management [phone]

## 2024-01-19 NOTE — Consult Note (Signed)
 " Hospital Consult    Reason for Consult: Need for permanent HD access Requesting Physician: Dr. Marlee, nephrology MRN #:  [id number]  History of Present Illness: This is a 44 y.o. female well-known to my practice having previously been seen in the outpatient setting for long-term HD access.  At the time of her last appointment, we discussed left arm AV graft.  She stated that she needed to coordinate her schedule, and was lost to follow-up.  She presented to the ED today with nausea and vomiting.  Has history of cannabinoid hyperemesis syndrome, with history of substance abuse disorder, hypertension asthma, anxiety/depression, CKD 5.  She has now progressed to needing dialysis.  On exam, [name] was alert and oriented.  She understood that she would need long-term HD access.  Fortunately, her nausea had resolved at the time of our meeting.  Past Medical History:  Diagnosis Date   Anxiety    Asthma    Chronic kidney disease    Depression    Diabetes mellitus    Hyperemesis arising during pregnancy    Hypertension    Mental disorder     Past Surgical History:  Procedure Laterality Date   ESOPHAGOGASTRODUODENOSCOPY (EGD) WITH PROPOFOL  N/A 08/07/2019   Procedure: ESOPHAGOGASTRODUODENOSCOPY (EGD) WITH PROPOFOL ;  Surgeon: Abran Norleen SAILOR, MD;  Location: [facility] ENDOSCOPY;  Service: Endoscopy;  Laterality: N/A;   NO PAST SURGERIES     TUBAL LIGATION  06/20/2011   Procedure: POST PARTUM TUBAL LIGATION;  Surgeon: Winton Felt, MD;  Location: [facility] ORS;  Service: Gynecology;  Laterality: Bilateral;  Bilateral post partum tubal ligation    Allergies[1]  Prior to Admission medications  Medication Sig Start Date End Date Taking? Authorizing Provider  amLODipine  (NORVASC ) 10 MG tablet Take 1 tablet (10 mg total) by mouth daily. Patient not taking: Reported on 01/19/2024 11/21/23 11/20/24  Amilibia, Jaden, DO  calcium  carbonate (TUMS - DOSED IN MG ELEMENTAL CALCIUM ) 500 MG chewable tablet Chew 1 tablet  (200 mg of elemental calcium  total) by mouth 3 (three) times daily between meals. Patient not taking: Reported on 01/19/2024 10/01/22   Marylu Gee, DO  hydrALAZINE  (APRESOLINE ) 100 MG tablet Take 1 tablet (100 mg total) by mouth 3 (three) times daily. Patient not taking: Reported on 01/19/2024 11/21/23 12/21/23  Amilibia, Jaden, DO  isosorbide  mononitrate (IMDUR ) 60 MG 24 hr tablet Take 1 tablet (60 mg total) by mouth daily. Patient not taking: Reported on 01/19/2024 11/21/23 12/21/23  Amilibia, Jaden, DO  metoprolol  succinate (TOPROL -XL) 50 MG 24 hr tablet Take 1 tablet (50 mg total) by mouth daily. Take with or immediately following a meal. Patient not taking: Reported on 01/19/2024 11/21/23   Amilibia, Jaden, DO  ondansetron  (ZOFRAN ) 4 MG tablet Take 1 tablet (4 mg total) by mouth daily as needed for nausea or vomiting. Patient not taking: Reported on 01/19/2024 11/14/23 11/13/24  Syeda, Raeeha, DO  pantoprazole  (PROTONIX ) 40 MG tablet Take 1 tablet (40 mg total) by mouth daily. Patient not taking: Reported on 01/19/2024 11/14/23   Edgardo Pontiff, DO  sodium bicarbonate  650 MG tablet Take 2 tablets (1,300 mg total) by mouth 2 (two) times daily. Patient not taking: Reported on 01/19/2024 11/14/23 11/13/24  Edgardo Pontiff, DO    Social History   Socioeconomic History   Marital status: Single    Spouse name: Not on file   Number of children: 2   Years of education: Not on file   Highest education level: Not on file  Occupational History  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    Comment: [employer]  Tobacco Use   Smoking status: Never   Smokeless tobacco: Never  Vaping Use   Vaping status: Never Used  Substance and Sexual Activity   Alcohol use: No   Drug use: Yes    Types: Marijuana   Sexual activity: Yes    Birth control/protection: None  Other Topics Concern   Not on file  Social History Narrative   Not on file   Social Drivers of Health   Tobacco Use: Low Risk (11/24/2023)    Patient History    Smoking Tobacco Use: Never    Smokeless Tobacco Use: Never    Passive Exposure: Not on file  Financial Resource Strain: Not on file  Food Insecurity: No Food Insecurity (11/13/2023)   Epic    Worried About Programme Researcher, Broadcasting/film/video in the Last Year: Never true    Ran Out of Food in the Last Year: Never true  Transportation Needs: No Transportation Needs (11/13/2023)   Epic    Lack of Transportation (Medical): No    Lack of Transportation (Non-Medical): No  Physical Activity: Not on file  Stress: Not on file  Social Connections: Not on file  Intimate Partner Violence: Not At Risk (11/13/2023)   Epic    Fear of Current or Ex-Partner: No    Emotionally Abused: No    Physically Abused: No    Sexually Abused: No  Depression (PHQ2-9): Low Risk (01/25/2023)   Depression (PHQ2-9)    PHQ-2 Score: 0  Alcohol Screen: Not on file  Housing: Low Risk (11/13/2023)   Epic    Unable to Pay for Housing in the Last Year: No    Number of Times Moved in the Last Year: 0    Homeless in the Last Year: No  Utilities: Not At Risk (11/14/2023)   Epic    Threatened with loss of utilities: No  Health Literacy: Not on file   Family History  Problem Relation Age of Onset   Heart disease Mother        CHF   Diabetes Sister    Anesthesia problems Neg Hx     ROS: Otherwise negative unless mentioned in HPI  Physical Examination  Vitals:   01/19/24 0812 01/19/24 0930  BP:  (!) 175/88  Pulse:  96  Resp:  14  Temp: 99.6 F (37.6 C)   SpO2:  100%   Body mass index is 24.83 kg/m.  General:  WDWN in NAD Gait: Not observed HENT: WNL, normocephalic Pulmonary: normal non-labored breathing, without Rales, rhonchi,  wheezing Cardiac: regular Abdomen: soft, NT/ND, no masses Skin: without rashes Vascular Exam/Pulses: 2+ left radial  Extremities: without ischemic changes, without Gangrene , without cellulitis; without open wounds;  Musculoskeletal: no muscle wasting or  atrophy  Neurologic: A&O X 3;  No focal weakness or paresthesias are detected; speech is fluent/normal Psychiatric:  The pt has Normal affect. Lymph:  Unremarkable  CBC    Component Value Date/Time   WBC 8.2 01/19/2024 0510   RBC 3.27 (L) 01/19/2024 0510   HGB 8.5 (L) 01/19/2024 0510   HGB 8.5 (L) 08/31/2022 1620   HCT 26.6 (L) 01/19/2024 0510   HCT 29.2 (L) 08/31/2022 1620   PLT 277 01/19/2024 0510   PLT 428 08/31/2022 1620   MCV 81.3 01/19/2024 0510   MCV 77 (L) 08/31/2022 1620   MCH 26.0 01/19/2024 0510   MCHC 32.0 01/19/2024 0510   RDW 16.5 (H) 01/19/2024  0510   RDW 22.3 (H) 08/31/2022 1620   LYMPHSABS 0.7 01/18/2024 1626   MONOABS 0.1 01/18/2024 1626   EOSABS 0.0 01/18/2024 1626   BASOSABS 0.0 01/18/2024 1626    BMET    Component Value Date/Time   NA 141 01/19/2024 0256   NA 136 09/16/2022 1445   K 4.5 01/19/2024 0256   CL 107 01/19/2024 0256   CO2 15 (L) 01/19/2024 0256   GLUCOSE 202 (H) 01/19/2024 0256   BUN 57 (H) 01/19/2024 0256   BUN 40 (H) 09/16/2022 1445   CREATININE 7.37 (H) 01/19/2024 0256   CREATININE 1.24 (H) 06/17/2011 1051   CALCIUM  8.3 (L) 01/19/2024 0256   GFRNONAA 6 (L) 01/19/2024 0256   GFRAA 22 (L) 08/08/2019 0314    COAGS: Lab Results  Component Value Date   INR 0.90 02/21/2012   INR 1.16 06/21/2010      ASSESSMENT/PLAN: This is a 44 y.o. female admitted with nausea vomiting in need of long-term HD access.  She has plans for tunneled dialysis catheter with interventional radiology on Friday.  I have added her to the schedule for Monday for long-term HD access-AV graft.  [name] and I had a long discussion regarding the above.  She was comfortable moving forward as long as she continues to progress appropriately during this admission.  Plan will be to see on Sunday to ensure that she is optimized prior to Monday OR.  After discussing the risks and benefits, [name] elected to proceed.   Fonda FORBES Rim MD MS [facility] [phone] 01/19/2024  9:37 AM     [1]  Allergies Allergen Reactions   Shellfish Allergy Anaphylaxis and Swelling   Morphine  And Codeine Nausea And Vomiting   "

## 2024-01-19 NOTE — ED Notes (Signed)
 Lab reports patient refused attempt to collect blood draw.

## 2024-01-20 ENCOUNTER — Inpatient Hospital Stay (HOSPITAL_COMMUNITY): Admitting: Anesthesiology

## 2024-01-20 ENCOUNTER — Telehealth (HOSPITAL_COMMUNITY): Payer: Self-pay

## 2024-01-20 ENCOUNTER — Inpatient Hospital Stay (HOSPITAL_COMMUNITY)

## 2024-01-20 ENCOUNTER — Encounter (HOSPITAL_COMMUNITY): Admission: EM | Disposition: A | Payer: Self-pay | Source: Home / Self Care | Attending: Infectious Diseases

## 2024-01-20 ENCOUNTER — Encounter (HOSPITAL_COMMUNITY): Payer: Self-pay | Admitting: Infectious Diseases

## 2024-01-20 DIAGNOSIS — N186 End stage renal disease: Secondary | ICD-10-CM

## 2024-01-20 DIAGNOSIS — R000 Tachycardia, unspecified: Secondary | ICD-10-CM | POA: Diagnosis not present

## 2024-01-20 DIAGNOSIS — R1116 Cannabis hyperemesis syndrome: Secondary | ICD-10-CM | POA: Diagnosis not present

## 2024-01-20 DIAGNOSIS — E1122 Type 2 diabetes mellitus with diabetic chronic kidney disease: Secondary | ICD-10-CM | POA: Diagnosis not present

## 2024-01-20 DIAGNOSIS — K209 Esophagitis, unspecified without bleeding: Secondary | ICD-10-CM | POA: Diagnosis not present

## 2024-01-20 DIAGNOSIS — I120 Hypertensive chronic kidney disease with stage 5 chronic kidney disease or end stage renal disease: Secondary | ICD-10-CM

## 2024-01-20 DIAGNOSIS — R7989 Other specified abnormal findings of blood chemistry: Secondary | ICD-10-CM | POA: Diagnosis not present

## 2024-01-20 HISTORY — PX: INSERTION OF DIALYSIS CATHETER: SHX1324

## 2024-01-20 HISTORY — PX: AV FISTULA PLACEMENT: SHX1204

## 2024-01-20 LAB — RENAL FUNCTION PANEL
Albumin: 3.8 g/dL (ref 3.5–5.0)
Anion gap: 15 (ref 5–15)
BUN: 55 mg/dL — ABNORMAL HIGH (ref 6–20)
CO2: 16 mmol/L — ABNORMAL LOW (ref 22–32)
Calcium: 6.5 mg/dL — ABNORMAL LOW (ref 8.9–10.3)
Chloride: 104 mmol/L (ref 98–111)
Creatinine, Ser: 8.15 mg/dL — ABNORMAL HIGH (ref 0.44–1.00)
GFR, Estimated: 6 mL/min — ABNORMAL LOW
Glucose, Bld: 131 mg/dL — ABNORMAL HIGH (ref 70–99)
Phosphorus: 5.4 mg/dL — ABNORMAL HIGH (ref 2.5–4.6)
Potassium: 4.3 mmol/L (ref 3.5–5.1)
Sodium: 136 mmol/L (ref 135–145)

## 2024-01-20 LAB — CBC
HCT: 22.5 % — ABNORMAL LOW (ref 36.0–46.0)
HCT: 23 % — ABNORMAL LOW (ref 36.0–46.0)
Hemoglobin: 7.3 g/dL — ABNORMAL LOW (ref 12.0–15.0)
Hemoglobin: 7.4 g/dL — ABNORMAL LOW (ref 12.0–15.0)
MCH: 26 pg (ref 26.0–34.0)
MCH: 26.1 pg (ref 26.0–34.0)
MCHC: 32.4 g/dL (ref 30.0–36.0)
MCHC: 32.2 g/dL (ref 30.0–36.0)
MCV: 80.1 fL (ref 80.0–100.0)
MCV: 81 fL (ref 80.0–100.0)
Platelets: 245 K/uL (ref 150–400)
Platelets: 263 K/uL (ref 150–400)
RBC: 2.81 MIL/uL — ABNORMAL LOW (ref 3.87–5.11)
RBC: 2.84 MIL/uL — ABNORMAL LOW (ref 3.87–5.11)
RDW: 15.9 % — ABNORMAL HIGH (ref 11.5–15.5)
RDW: 15.9 % — ABNORMAL HIGH (ref 11.5–15.5)
WBC: 4.5 K/uL (ref 4.0–10.5)
WBC: 10.3 K/uL (ref 4.0–10.5)
nRBC: 0 % (ref 0.0–0.2)
nRBC: 0 % (ref 0.0–0.2)

## 2024-01-20 LAB — GLUCOSE, CAPILLARY
Glucose-Capillary: 93 mg/dL (ref 70–99)
Glucose-Capillary: 96 mg/dL (ref 70–99)
Glucose-Capillary: 98 mg/dL (ref 70–99)
Glucose-Capillary: 97 mg/dL (ref 70–99)
Glucose-Capillary: 99 mg/dL (ref 70–99)
Glucose-Capillary: 123 mg/dL — ABNORMAL HIGH (ref 70–99)

## 2024-01-20 LAB — HEPATITIS B SURFACE ANTIGEN: Hepatitis B Surface Ag: NONREACTIVE

## 2024-01-20 LAB — IRON AND TIBC
Iron: 121 ug/dL (ref 28–170)
Saturation Ratios: 49 % — ABNORMAL HIGH (ref 10.4–31.8)
TIBC: 246 ug/dL — ABNORMAL LOW (ref 250–450)
UIBC: 125 ug/dL

## 2024-01-20 LAB — FERRITIN: Ferritin: 181 ng/mL (ref 11–307)

## 2024-01-20 LAB — SURGICAL PCR SCREEN
MRSA, PCR: NEGATIVE
Staphylococcus aureus: NEGATIVE

## 2024-01-20 LAB — PATHOLOGIST SMEAR REVIEW

## 2024-01-20 SURGERY — ARTERIOVENOUS (AV) FISTULA CREATION
Anesthesia: General

## 2024-01-20 MED ORDER — PHENYLEPHRINE 80 MCG/ML (10ML) SYRINGE FOR IV PUSH (FOR BLOOD PRESSURE SUPPORT)
PREFILLED_SYRINGE | INTRAVENOUS | Status: DC | PRN
  Administered 2024-01-20 (×5): 160 ug via INTRAVENOUS

## 2024-01-20 MED ORDER — HEMOSTATIC AGENTS (NO CHARGE) OPTIME
TOPICAL | Status: DC | PRN
  Administered 2024-01-20: 1 via TOPICAL

## 2024-01-20 MED ORDER — PHENYLEPHRINE HCL-NACL 20-0.9 MG/250ML-% IV SOLN
INTRAVENOUS | Status: DC | PRN
  Administered 2024-01-20: 25 ug/min via INTRAVENOUS

## 2024-01-20 MED ORDER — SODIUM CHLORIDE 0.9 % IV SOLN
200.0000 mg | INTRAVENOUS | Status: AC
  Administered 2024-01-21: 200 mg via INTRAVENOUS
  Filled 2024-01-20 (×2): qty 10

## 2024-01-20 MED ORDER — HEPARIN 6000 UNIT IRRIGATION SOLUTION
Status: DC | PRN
  Administered 2024-01-20: 1

## 2024-01-20 MED ORDER — ORAL CARE MOUTH RINSE: 15.0000 mL | Freq: Once | OROMUCOSAL | Status: AC

## 2024-01-20 MED ORDER — MIDAZOLAM HCL 2 MG/2ML IJ SOLN
INTRAMUSCULAR | Status: AC
  Filled 2024-01-20: qty 2

## 2024-01-20 MED ORDER — HYDROXYZINE HCL 10 MG PO TABS
10.0000 mg | ORAL_TABLET | Freq: Three times a day (TID) | ORAL | Status: DC | PRN
  Administered 2024-01-20: 10 mg via ORAL
  Filled 2024-01-20: qty 1

## 2024-01-20 MED ORDER — PHENYLEPHRINE HCL (PRESSORS) 10 MG/ML IV SOLN
INTRAVENOUS | Status: AC
  Filled 2024-01-20: qty 1

## 2024-01-20 MED ORDER — FENTANYL CITRATE (PF) 100 MCG/2ML IJ SOLN
INTRAMUSCULAR | Status: AC
  Filled 2024-01-20: qty 2

## 2024-01-20 MED ORDER — OXYCODONE HCL 5 MG PO TABS
5.0000 mg | ORAL_TABLET | ORAL | Status: DC | PRN
  Administered 2024-01-20: 5 mg via ORAL
  Filled 2024-01-20 (×2): qty 1

## 2024-01-20 MED ORDER — HYDRALAZINE HCL 25 MG PO TABS
25.0000 mg | ORAL_TABLET | Freq: Three times a day (TID) | ORAL | Status: DC
  Administered 2024-01-20 – 2024-01-24 (×11): 25 mg via ORAL
  Filled 2024-01-20 (×12): qty 1

## 2024-01-20 MED ORDER — LIDOCAINE 2% (20 MG/ML) 5 ML SYRINGE
INTRAMUSCULAR | Status: AC
  Filled 2024-01-20: qty 5

## 2024-01-20 MED ORDER — FENTANYL CITRATE (PF) 250 MCG/5ML IJ SOLN
INTRAMUSCULAR | Status: AC
  Filled 2024-01-20: qty 5

## 2024-01-20 MED ORDER — ONDANSETRON HCL 4 MG/2ML IJ SOLN
INTRAMUSCULAR | Status: AC
  Filled 2024-01-20: qty 2

## 2024-01-20 MED ORDER — HYDROMORPHONE HCL 2 MG PO TABS
1.0000 mg | ORAL_TABLET | Freq: Four times a day (QID) | ORAL | Status: DC | PRN
  Administered 2024-01-20: 1 mg via ORAL
  Filled 2024-01-20 (×2): qty 1

## 2024-01-20 MED ORDER — CHLORHEXIDINE GLUCONATE CLOTH 2 % EX PADS
6.0000 | MEDICATED_PAD | Freq: Every day | CUTANEOUS | Status: DC
  Administered 2024-01-21 – 2024-01-24 (×4): 6 via TOPICAL

## 2024-01-20 MED ORDER — CHLORHEXIDINE GLUCONATE 0.12 % MT SOLN
15.0000 mL | Freq: Once | OROMUCOSAL | Status: AC
  Administered 2024-01-20: 15 mL via OROMUCOSAL

## 2024-01-20 MED ORDER — SODIUM CHLORIDE 0.9% FLUSH: 10.0000 mL | INTRAVENOUS | Status: DC | PRN

## 2024-01-20 MED ORDER — PROPOFOL 10 MG/ML IV BOLUS
INTRAVENOUS | Status: DC | PRN
  Administered 2024-01-20: 160 mg via INTRAVENOUS

## 2024-01-20 MED ORDER — HEPARIN SODIUM (PORCINE) 1000 UNIT/ML IJ SOLN
INTRAMUSCULAR | Status: DC | PRN
  Administered 2024-01-20: 3200 [IU]

## 2024-01-20 MED ORDER — ACETAMINOPHEN 500 MG PO TABS
1000.0000 mg | ORAL_TABLET | Freq: Once | ORAL | Status: AC
  Administered 2024-01-20: 1000 mg via ORAL
  Filled 2024-01-20: qty 2

## 2024-01-20 MED ORDER — HYDROMORPHONE HCL 1 MG/ML IJ SOLN
1.0000 mg | Freq: Once | INTRAMUSCULAR | Status: AC
  Administered 2024-01-20: 1 mg via INTRAVENOUS
  Filled 2024-01-20: qty 1

## 2024-01-20 MED ORDER — FENTANYL CITRATE (PF) 100 MCG/2ML IJ SOLN
25.0000 ug | INTRAMUSCULAR | Status: DC | PRN
  Administered 2024-01-20 (×2): 50 ug via INTRAVENOUS

## 2024-01-20 MED ORDER — SODIUM CHLORIDE 0.9 % IV SOLN: INTRAVENOUS | Status: DC

## 2024-01-20 MED ORDER — FENTANYL CITRATE (PF) 250 MCG/5ML IJ SOLN
INTRAMUSCULAR | Status: DC | PRN
  Administered 2024-01-20 (×3): 50 ug via INTRAVENOUS

## 2024-01-20 MED ORDER — MIDAZOLAM HCL (PF) 2 MG/2ML IJ SOLN
INTRAMUSCULAR | Status: DC | PRN
  Administered 2024-01-20: 2 mg via INTRAVENOUS

## 2024-01-20 MED ORDER — CEFAZOLIN SODIUM 1 G IJ SOLR
INTRAMUSCULAR | Status: AC
  Filled 2024-01-20: qty 20

## 2024-01-20 MED ORDER — SODIUM CHLORIDE 0.9% FLUSH
10.0000 mL | Freq: Two times a day (BID) | INTRAVENOUS | Status: DC
  Administered 2024-01-21 – 2024-01-23 (×7): 10 mL

## 2024-01-20 MED ORDER — 0.9 % SODIUM CHLORIDE (POUR BTL) OPTIME
TOPICAL | Status: DC | PRN
  Administered 2024-01-20: 1000 mL

## 2024-01-20 MED ORDER — ONDANSETRON HCL 4 MG/2ML IJ SOLN
INTRAMUSCULAR | Status: DC | PRN
  Administered 2024-01-20: 4 mg via INTRAVENOUS

## 2024-01-20 MED ORDER — PROPOFOL 10 MG/ML IV BOLUS
INTRAVENOUS | Status: AC
  Filled 2024-01-20: qty 20

## 2024-01-20 MED ORDER — DARBEPOETIN ALFA 60 MCG/0.3ML IJ SOSY: 60.0000 ug | PREFILLED_SYRINGE | INTRAMUSCULAR | Status: DC

## 2024-01-20 MED ORDER — HEPARIN SODIUM (PORCINE) 1000 UNIT/ML IJ SOLN
INTRAMUSCULAR | Status: AC
  Filled 2024-01-20: qty 10

## 2024-01-20 MED ORDER — LIDOCAINE 2% (20 MG/ML) 5 ML SYRINGE
INTRAMUSCULAR | Status: DC | PRN
  Administered 2024-01-20: 60 mg via INTRAVENOUS

## 2024-01-20 MED ORDER — ONDANSETRON HCL 4 MG/2ML IJ SOLN: 4.0000 mg | Freq: Once | INTRAMUSCULAR | Status: DC | PRN

## 2024-01-20 MED ORDER — ORAL CARE MOUTH RINSE: 15.0000 mL | OROMUCOSAL | Status: DC | PRN

## 2024-01-20 MED ORDER — ONDANSETRON 4 MG PO TBDP
4.0000 mg | ORAL_TABLET | Freq: Three times a day (TID) | ORAL | Status: DC | PRN
  Administered 2024-01-20: 4 mg via ORAL
  Filled 2024-01-20: qty 1

## 2024-01-20 MED ORDER — HEPARIN SODIUM (PORCINE) 5000 UNIT/ML IJ SOLN
5000.0000 [IU] | Freq: Three times a day (TID) | INTRAMUSCULAR | Status: DC
  Administered 2024-01-21 – 2024-01-24 (×9): 5000 [IU] via SUBCUTANEOUS
  Filled 2024-01-20 (×9): qty 1

## 2024-01-20 MED ORDER — HEPARIN 6000 UNIT IRRIGATION SOLUTION
Status: AC
  Filled 2024-01-20: qty 500

## 2024-01-20 MED ORDER — CEFAZOLIN SODIUM-DEXTROSE 2-3 GM-%(50ML) IV SOLR
INTRAVENOUS | Status: DC | PRN
  Administered 2024-01-20: 2 g via INTRAVENOUS

## 2024-01-20 SURGICAL SUPPLY — 47 items
ARMBAND PINK RESTRICT EXTREMIT (MISCELLANEOUS) ×2 IMPLANT
BAG COUNTER SPONGE SURGICOUNT (BAG) ×2 IMPLANT
BAG DECANTER FOR FLEXI CONT (MISCELLANEOUS) ×2 IMPLANT
BIOPATCH RED 1 DISK 7.0 (GAUZE/BANDAGES/DRESSINGS) ×2 IMPLANT
CANISTER SUCTION 3000ML PPV (SUCTIONS) ×2 IMPLANT
CATH PALINDROME-P 19CM W/VT (CATHETERS) IMPLANT
CATH PALINDROME-P 28CM W/VT (CATHETERS) IMPLANT
CLIP LIGATING EXTRA MED SLVR (CLIP) ×2 IMPLANT
CLIP LIGATING EXTRA SM BLUE (MISCELLANEOUS) ×2 IMPLANT
COVER PROBE W GEL 5X96 (DRAPES) ×2 IMPLANT
COVER SURGICAL LIGHT HANDLE (MISCELLANEOUS) ×2 IMPLANT
DERMABOND ADVANCED .7 DNX12 (GAUZE/BANDAGES/DRESSINGS) ×2 IMPLANT
DRAPE C-ARM 42X72 X-RAY (DRAPES) ×2 IMPLANT
DRAPE CHEST BREAST 15X10 FENES (DRAPES) ×2 IMPLANT
ELECTRODE REM PT RTRN 9FT ADLT (ELECTROSURGICAL) ×2 IMPLANT
GAUZE 4X4 16PLY ~~LOC~~+RFID DBL (SPONGE) ×2 IMPLANT
GLOVE BIO SURGEON STRL SZ7.5 (GLOVE) ×2 IMPLANT
GOWN STRL REUS W/ TWL LRG LVL3 (GOWN DISPOSABLE) ×4 IMPLANT
GOWN STRL REUS W/ TWL XL LVL3 (GOWN DISPOSABLE) ×2 IMPLANT
GRAFT GORETEX STRT 4-7X45 (Vascular Products) IMPLANT
INSERT FOGARTY SM (MISCELLANEOUS) IMPLANT
KIT BASIN OR (CUSTOM PROCEDURE TRAY) ×2 IMPLANT
KIT PALINDROME-P 55CM (CATHETERS) IMPLANT
KIT TURNOVER KIT B (KITS) ×2 IMPLANT
NEEDLE 18GX1X1/2 (RX/OR ONLY) (NEEDLE) ×2 IMPLANT
NEEDLE HYPO 25GX1X1/2 BEV (NEEDLE) ×2 IMPLANT
PACK CV ACCESS (CUSTOM PROCEDURE TRAY) ×2 IMPLANT
PACK SRG BSC III STRL LF ECLPS (CUSTOM PROCEDURE TRAY) ×2 IMPLANT
PAD ARMBOARD POSITIONER FOAM (MISCELLANEOUS) ×4 IMPLANT
POWDER SURGICEL 3.0 GRAM (HEMOSTASIS) IMPLANT
SET MICROPUNCTURE 5F STIFF (MISCELLANEOUS) IMPLANT
SLING ARM FOAM STRAP LRG (SOFTGOODS) IMPLANT
SOAP 2 % CHG 4 OZ (WOUND CARE) ×2 IMPLANT
SOLN 0.9% NACL POUR BTL 1000ML (IV SOLUTION) ×2 IMPLANT
SOLN STERILE WATER BTL 1000 ML (IV SOLUTION) ×2 IMPLANT
SUT ETHILON 3 0 PS 1 (SUTURE) ×2 IMPLANT
SUT MNCRL AB 4-0 PS2 18 (SUTURE) ×2 IMPLANT
SUT PROLENE 6 0 BV (SUTURE) ×2 IMPLANT
SUT VIC AB 3-0 SH 27X BRD (SUTURE) ×2 IMPLANT
SYR 10ML LL (SYRINGE) ×2 IMPLANT
SYR 20ML LL LF (SYRINGE) ×4 IMPLANT
SYR 5ML LL (SYRINGE) ×2 IMPLANT
SYR CONTROL 10ML LL (SYRINGE) ×2 IMPLANT
TOWEL GREEN STERILE (TOWEL DISPOSABLE) ×2 IMPLANT
TOWEL GREEN STERILE FF (TOWEL DISPOSABLE) ×4 IMPLANT
UNDERPAD 30X36 HEAVY ABSORB (UNDERPADS AND DIAPERS) ×2 IMPLANT
WIRE BENTSON .035X145CM (WIRE) IMPLANT

## 2024-01-20 NOTE — Telephone Encounter (Signed)
 Auth Submission: NO AUTH NEEDED Site of care: Site of care: [facility] Payer: [employer] Medication & CPT/J Code(s) submitted: Retacrit (V4893) Diagnosis Code: N18.5/D63.1 Route of submission (phone, fax, portal): portal Phone # Fax # Auth type: Buy/Bill HB Units/visits requested: 20000 units q4weeks Reference number:  Approval from: 01/20/24 to 08/17/24

## 2024-01-20 NOTE — Progress Notes (Signed)
 " [facility] Progress Note   Subjective:   NPO for perm cath +AVG today.  No new issues.    Objective Vitals:   01/19/24 1946 01/19/24 2224 01/20/24 0500 01/20/24 0539  BP: 122/60 (!) 94/50  (!) 101/45  Pulse: 99 94  84  Resp: 18 18  18   Temp: 99 F (37.2 C) 98.9 F (37.2 C)  98.5 F (36.9 C)  TempSrc: Oral Oral  Oral  SpO2: 100% 98%  100%  Weight:   71.9 kg   Height:       Physical Exam General: nontoxic appearing Heart:RRR Lungs:clear Abdomen:soft Extremities:no edema Dialysis Access: none yet  Additional Objective Labs: Basic Metabolic Panel: Recent Labs  Lab 01/19/24 0256 01/19/24 2148 01/20/24 0330  NA 141 137 136  K 4.5 4.2 4.3  CL 107 104 104  CO2 15* 16* 16*  GLUCOSE 202* 163* 131*  BUN 57* 56* 55*  CREATININE 7.37* 8.08* 8.15*  CALCIUM  8.3* 6.6* 6.5*  PHOS  --   --  5.4*   Liver Function Tests: Recent Labs  Lab 01/18/24 1626 01/20/24 0330  AST 26  --   ALT 14  --   ALKPHOS 96  --   BILITOT 0.4  --   PROT 8.4*  --   ALBUMIN 4.7 3.8   Recent Labs  Lab 01/18/24 1626  LIPASE 153*   CBC: Recent Labs  Lab 01/18/24 1626 01/18/24 1644 01/19/24 0256 01/19/24 0510 01/19/24 2148 01/20/24 0330  WBC 7.2  --  9.8 8.2 6.3 4.5  NEUTROABS 6.3  --   --   --   --   --   HGB 9.5*   < > 8.9* 8.5* 7.5* 7.3*  HCT 31.3*   < > 27.3* 26.6* 23.0* 22.5*  MCV 85.3  --  79.4* 81.3 79.6* 80.1  PLT 221  --  349 277 233 245   < > = values in this interval not displayed.   Blood Culture    Component Value Date/Time   SDES BLOOD LEFT HAND 08/03/2019 1553   SPECREQUEST  08/03/2019 1553    BOTTLES DRAWN AEROBIC ONLY Blood Culture results may not be optimal due to an inadequate volume of blood received in culture bottles   CULT  08/03/2019 1553    NO GROWTH 5 DAYS Performed at [facility], [address], [place], [place] [address]    REPTSTATUS 08/08/2019 FINAL 08/03/2019 1553    Cardiac Enzymes: No results for input(s):  CKTOTAL, CKMB, CKMBINDEX, TROPONINI in the last 168 hours. CBG: Recent Labs  Lab 01/19/24 0810 01/19/24 1151 01/19/24 1605 01/19/24 1947  GLUCAP 137* 148* 132* 137*   Iron  Studies: No results for input(s): IRON , TIBC, TRANSFERRIN, FERRITIN in the last 72 hours. @lablastinr3 @ Studies/Results: DG Chest Port 1 View Result Date: 01/18/2024 EXAM: 1 VIEW(S) XRAY OF THE CHEST 01/18/2024 11:27:16 PM COMPARISON: 11/13/2023 CLINICAL HISTORY: HTN FINDINGS: LUNGS AND PLEURA: No focal pulmonary opacity. No pleural effusion. No pneumothorax. HEART AND MEDIASTINUM: No acute abnormality of the cardiac and mediastinal silhouettes. BONES AND SOFT TISSUES: No acute osseous abnormality. IMPRESSION: 1. No acute cardiopulmonary process. Electronically signed by: Greig Pique MD 01/18/2024 11:58 PM EST RP Workstation: [id number]   CT ABDOMEN PELVIS WO CONTRAST Result Date: 01/18/2024 EXAM: CT ABDOMEN AND PELVIS WITHOUT CONTRAST 01/18/2024 11:14:56 PM TECHNIQUE: CT of the abdomen and pelvis was performed without the administration of intravenous contrast. Multiplanar reformatted images are provided for review. Automated exposure control, iterative reconstruction, and/or weight-based  adjustment of the mA/kV was utilized to reduce the radiation dose to as low as reasonably achievable. COMPARISON: CT chest abdomen and pelvis 11/13/2023. CLINICAL HISTORY: Abdominal pain, acute, nonlocalized. FINDINGS: LOWER CHEST: No acute abnormality. LIVER: The liver is unremarkable. GALLBLADDER AND BILE DUCTS: Gallbladder is unremarkable. No biliary ductal dilatation. SPLEEN: No acute abnormality. PANCREAS: No acute abnormality. ADRENAL GLANDS: No acute abnormality. KIDNEYS, URETERS AND BLADDER: No stones in the kidneys or ureters. No hydronephrosis. No perinephric or periureteral stranding. Urinary bladder is unremarkable. GI AND BOWEL: Stomach demonstrates no acute abnormality. There is wall thickening of the distal  esophagus. The appendix appears normal. There is no bowel obstruction. PERITONEUM AND RETROPERITONEUM: No ascites. No free air. VASCULATURE: Aorta is normal in caliber. LYMPH NODES: No lymphadenopathy. REPRODUCTIVE ORGANS: No acute abnormality. BONES AND SOFT TISSUES: No acute osseous abnormality. No focal soft tissue abnormality. IMPRESSION: 1. Wall thickening of the distal esophagus worrisome for esophagitis . 2. Normal appendix. Electronically signed by: Greig Pique MD 01/18/2024 11:56 PM EST RP Workstation: [id number]   CT Head Wo Contrast Result Date: 01/18/2024 EXAM: CT HEAD WITHOUT CONTRAST 01/18/2024 10:47:30 PM TECHNIQUE: CT of the head was performed without the administration of intravenous contrast. Automated exposure control, iterative reconstruction, and/or weight based adjustment of the mA/kV was utilized to reduce the radiation dose to as low as reasonably achievable. COMPARISON: 09/22/2020 CLINICAL HISTORY: Neuro deficit, acute, stroke suspected. FINDINGS: BRAIN AND VENTRICLES: No acute hemorrhage. No evidence of acute infarct. No hydrocephalus. No extra-axial collection. No mass effect or midline shift. ORBITS: No acute abnormality. SINUSES: No acute abnormality. SOFT TISSUES AND SKULL: No acute soft tissue abnormality. No skull fracture. IMPRESSION: 1. No acute intracranial abnormality. Electronically signed by: Franky Stanford MD 01/18/2024 11:08 PM EST RP Workstation: [id number]   Medications:   amitriptyline   10 mg Oral QHS   amLODipine   10 mg Oral Daily   capsicum   Topical BID   heparin   5,000 Units Subcutaneous Q8H   hydrALAZINE   50 mg Oral TID   insulin  aspart  0-6 Units Subcutaneous TID WC   pantoprazole  (PROTONIX ) IV  40 mg Intravenous Daily     Assessment 59F presenting with hypertensive urgency, recurrent nausea/vomiting, uremia with advanced CKD 5.   CKD 5, uremic with nausea/vomiting and malaise: Patient agrees to initiate hemodialysis during  admission. Nausea/vomiting: Likely from ongoing marijuana with uremia contributing Metabolic acidosis: Driven by advanced CKD and placed on sodium bicarbonate  drip at admission, c continue to monitor, will be addressed by dialysis Anemia, borderline microcytic.  TSAT 17% 11/16/2023 with ferritin 208 DM2 Hypertension with urgency due to nonadherence, placed on labetalol  IV and home meds resumed; BP low now, reducing meds   Plan [facility] consulted for access, TDC and AVG today Start HD today 1/2 after TDC placed D/c bicarb gtt after HD initiated Will need CLIP - SW aware/working on it Start Fe/ESA today Decrease hydralazine  50 TID to 25 TID with hold parameters for SBP < 120 Daily weights, Daily Renal Panel, Strict I/Os, Avoid nephrotoxins (NSAIDs, judicious IV Contrast)   Manuelita Barters MD 01/20/2024, 7:56 AM  [facility] Pager: [phone]   "

## 2024-01-20 NOTE — Progress Notes (Signed)
 Interventional Radiology Brief Note:  IR consulted for tunneled HD catheter placement however patient slated for same with [facility].   Order to IR canceled.   Destinee Taber, MS RD PA-C

## 2024-01-20 NOTE — Anesthesia Preprocedure Evaluation (Addendum)
"                                    Anesthesia Evaluation  Patient identified by MRN, date of birth, ID band Patient awake    Reviewed: Allergy & Precautions, NPO status , Patient's Chart, lab work & pertinent test results  Airway Mallampati: II  TM Distance: >3 FB Neck ROM: Full    Dental  (+) Dental Advisory Given, Missing, Poor Dentition   Pulmonary asthma    Pulmonary exam normal breath sounds clear to auscultation       Cardiovascular hypertension, Pt. on medications Normal cardiovascular exam Rhythm:Regular Rate:Normal     Neuro/Psych neg Seizures PSYCHIATRIC DISORDERS Anxiety Depression    negative neurological ROS     GI/Hepatic ,GERD  Medicated,,(+)     substance abuse  marijuana use  Endo/Other  diabetes, Type 2    Renal/GU Renal InsufficiencyRenal disease     Musculoskeletal   Abdominal   Peds  Hematology  (+) Blood dyscrasia, anemia   Anesthesia Other Findings Day of surgery medications reviewed with the patient.  Reproductive/Obstetrics                              Anesthesia Physical Anesthesia Plan  ASA: 3  Anesthesia Plan: General   Post-op Pain Management: Tylenol  PO (pre-op )*   Induction: Intravenous  PONV Risk Score and Plan: 3 and Dexamethasone , Ondansetron  and Midazolam   Airway Management Planned: LMA  Additional Equipment:   Intra-op Plan:   Post-operative Plan: Extubation in OR  Informed Consent: I have reviewed the patients History and Physical, chart, labs and discussed the procedure including the risks, benefits and alternatives for the proposed anesthesia with the patient or authorized representative who has indicated his/her understanding and acceptance.     Dental advisory given  Plan Discussed with: CRNA  Anesthesia Plan Comments:          Anesthesia Quick Evaluation  "

## 2024-01-20 NOTE — Progress Notes (Signed)
" °  Progress Note    01/20/2024 4:50 PM   Subjective: No overnight issues, upset about waiting late in the day for surgery  Vitals:   01/20/24 1000 01/20/24 1306  BP: 130/67 122/75  Pulse:  97  Resp:  19  Temp:  98.2 F (36.8 C)  SpO2:  99%    Physical Exam: Awake alert and oriented Nonlabored respirations Palpable left radial artery pulse  CBC    Component Value Date/Time   WBC 4.5 01/20/2024 0330   RBC 2.81 (L) 01/20/2024 0330   HGB 7.3 (L) 01/20/2024 0330   HGB 8.5 (L) 08/31/2022 1620   HCT 22.5 (L) 01/20/2024 0330   HCT 29.2 (L) 08/31/2022 1620   PLT 245 01/20/2024 0330   PLT 428 08/31/2022 1620   MCV 80.1 01/20/2024 0330   MCV 77 (L) 08/31/2022 1620   MCH 26.0 01/20/2024 0330   MCHC 32.4 01/20/2024 0330   RDW 15.9 (H) 01/20/2024 0330   RDW 22.3 (H) 08/31/2022 1620   LYMPHSABS 0.7 01/18/2024 1626   MONOABS 0.1 01/18/2024 1626   EOSABS 0.0 01/18/2024 1626   BASOSABS 0.0 01/18/2024 1626    BMET    Component Value Date/Time   NA 136 01/20/2024 0330   NA 136 09/16/2022 1445   K 4.3 01/20/2024 0330   CL 104 01/20/2024 0330   CO2 16 (L) 01/20/2024 0330   GLUCOSE 131 (H) 01/20/2024 0330   BUN 55 (H) 01/20/2024 0330   BUN 40 (H) 09/16/2022 1445   CREATININE 8.15 (H) 01/20/2024 0330   CREATININE 1.24 (H) 06/17/2011 1051   CALCIUM  6.5 (L) 01/20/2024 0330   GFRNONAA 6 (L) 01/20/2024 0330   GFRAA 22 (L) 08/08/2019 0314    INR    Component Value Date/Time   INR 0.90 02/21/2012 0907     Intake/Output Summary (Last 24 hours) at 01/20/2024 1650 Last data filed at 01/20/2024 1058 Gross per 24 hour  Intake 360 ml  Output 0 ml  Net 360 ml     Assessment:  44 y.o. female is now end-stage renal disease Plan: OR today for left arm AV graft and tunneled dialysis catheter   Finch Costanzo C. Sheree, MD [facility] Office: [phone] Pager: [phone]  01/20/2024 4:50 PM  "

## 2024-01-20 NOTE — Progress Notes (Signed)
 "  HD#1 SUBJECTIVE:  Patient Summary: [name] is a 44 y.o. 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   Overnight Events: none   Interim History:  Saw patient at bedside this a.m.  Reports no emesis since yesterday.  Denies CP, HA, SOB. Reports abdominal pain has improved.  OBJECTIVE:  Vital Signs: Vitals:   01/19/24 2224 01/20/24 0500 01/20/24 0539 01/20/24 0843  BP: (!) 94/50  (!) 101/45 130/67  Pulse: 94  84 88  Resp: 18  18 13   Temp: 98.9 F (37.2 C)  98.5 F (36.9 C) 98.8 F (37.1 C)  TempSrc: Oral  Oral Oral  SpO2: 98%  100% 99%  Weight:  71.9 kg    Height:       Supplemental O2: Room Air SpO2: 99 %  Filed Weights   01/18/24 1551 01/20/24 0500  Weight: 71.9 kg 71.9 kg     Intake/Output Summary (Last 24 hours) at 01/20/2024 1128 Last data filed at 01/20/2024 1058 Gross per 24 hour  Intake 1047 ml  Output 0 ml  Net 1047 ml   Net IO Since Admission: 1,047 mL [01/20/24 1128]  Physical Exam: Physical Exam Cardiovascular:     Rate and Rhythm: Normal rate and regular rhythm.  Pulmonary:     Effort: Pulmonary effort is normal. No respiratory distress.     Breath sounds: Normal breath sounds.  Abdominal:     Palpations: Abdomen is soft.     Tenderness: There is no guarding or rebound.     Comments: Mild tenderness to palpation in left abdominal quadrants  Neurological:     Mental Status: She is alert.  Psychiatric:        Mood and Affect: Mood normal.     Patient Lines/Drains/Airways Status     Active Line/Drains/Airways     Name Placement date Placement time Site Days   Peripheral IV 01/19/24 22 G Posterior;Right Hand 01/19/24  0252  Hand  1   Peripheral IV 01/19/24 20 G 2.5 Left;Medial Antecubital  01/19/24  0303  Antecubital  1            Pertinent labs and imaging:      Latest Ref Rng & Units 01/20/2024    3:30 AM 01/19/2024    9:48 PM 01/19/2024    5:10 AM  CBC  WBC 4.0 - 10.5 K/uL 4.5  6.3  8.2   Hemoglobin 12.0 - 15.0 g/dL 7.3  7.5  8.5   Hematocrit 36.0 - 46.0 % 22.5  23.0  26.6   Platelets 150 - 400 K/uL 245  233  277        Latest Ref Rng & Units 01/20/2024    3:30 AM 01/19/2024    9:48 PM 01/19/2024    2:56 AM  CMP  Glucose 70 - 99 mg/dL 868  836  797   BUN 6 - 20 mg/dL 55  56  57   Creatinine 0.44 - 1.00 mg/dL 1.84  1.91  2.62   Sodium 135 - 145 mmol/L 136  137  141   Potassium 3.5 - 5.1 mmol/L 4.3  4.2  4.5   Chloride 98 - 111 mmol/L 104  104  107   CO2 22 - 32 mmol/L 16  16  15    Calcium  8.9 -  10.3 mg/dL 6.5  6.6  8.3     No results found.  ASSESSMENT/PLAN:  Assessment: Principal Problem:   Cannabinoid hyperemesis syndrome Active Problems:   Type 2 diabetes mellitus (HCC)   Esophagitis   CKD (chronic kidney disease) stage 5, GFR less than 15 ml/min (HCC)   Hypertensive urgency   Increased anion gap metabolic acidosis   Plan: #Cannabinoid hyperemesis syndrome #Abdominal Pain #Esophagitis, POA  Has not been vomiting since yesterday.  Improvement in abdominal pain as well.  Will discontinue treating her with Ativan  as needed for anxiety and nausea.  Continue treating her with amitriptyline  and capsicum Cream.  Will start her on hydroxyzine  for anxiety. -Amitriptyline  10 mg daily -Capsicum cream twice daily -Hydroxyzine  10 mg 3 times daily as needed - Zofran  4 mg as needed -Protonix  40 mg IV daily  Hypertensive Urgency, resolved Hx of HTN Blood pressures have dropped overnight.  Home hydralazine  dose lowered by nephrology.  Will continue monitoring BP. -Continue amlodipine  10 mg daily - Hydralazine  lowered to 25 mg p.o. 3 times daily -Labetalol  5 mg IV every 2 hours as needed if BP is high  CKD 5 AGMA, resolved Vascular surgery to place [facility]  and AVG today.  Patient to start HD today after [facility] has been placed.  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   -TDC and AVG placement with [facility] today -HD today after TDC placement -Started Fe/ESA today -Daily weights, strict I/Os, daily renal panel  Anemia of CKD Hemoglobin trending downwards.  Iron /ferritin studies ordered which showed that patient has adequate iron  stores.  Nephrology started patient on IV iron .  Will monitor hemoglobin levels daily. - IV iron  - Daily CBC  Best Practice: Diet: NPO IVF: None  VTE: heparin  injection 5,000 Units Start: 01/19/24 0145 Code: Full   Disposition planning: Therapy Recs: None, DME: none Family Contact: Norleen Dross, to be notified. DISPO: Anticipated discharge to Home pending clinical improvement.  Signature:  Rebecka Edgardo [facility]  11:28 AM, 01/20/2024  On Call pager [phone]  "

## 2024-01-20 NOTE — TOC Initial Note (Signed)
 Transition of Care [facility]) - Initial/Assessment Note    Patient Details  Name: [name] MRN: [id number] Date of Birth: 08-29-1980  Transition of Care [facility]) CM/SW Contact:    Lendia Dais, LCSWA Phone Number: 01/20/2024, 4:35 PM  Clinical Narrative: Pt is from home w/ fam and reports no DME, has seen PCP, and indep w/ ADL's. Pt states that Norleen Dross can be the [facility] and was agreeable to CSW placing a spiritual consult. Pt gave verbal permission for CSW to contact John with any updates.  Pt worls full-time and able to afford basic needs and reports no SDOH concerns. Pt reports no hx of MH/SU or HH/STR.   No further TOC needs at this time. Please place [facility] consult with any further needs.   Expected Discharge Plan: Home/Self Care     Patient Goals and CMS Choice Patient states their goals for this hospitalization and ongoing recovery are:: Continue life   Choice offered to / list presented to : NA      Expected Discharge Plan and Services In-house Referral: Clinical Social Work, Chaplain     Living arrangements for the past 2 months: Single Family Home                                      Prior Living Arrangements/Services Living arrangements for the past 2 months: Single Family Home Lives with:: Relatives Patient language and need for interpreter reviewed:: Yes Do you feel safe going back to the place where you live?: Yes      Need for Family Participation in Patient Care: No (Comment) Care giver support system in place?: No (comment)   Criminal Activity/Legal Involvement Pertinent to Current Situation/Hospitalization: No - Comment as needed  Activities of Daily Living      Permission Sought/Granted Permission sought to share information with : Family Supports Permission granted to share information with : Yes, Verbal Permission Granted  Share Information with NAME: Norleen Dross     Permission granted to share info w Relationship: Significant  other  Permission granted to share info w Contact Information: [phone]  Emotional Assessment Appearance:: Appears stated age Attitude/Demeanor/Rapport: Engaged Affect (typically observed): Appropriate Orientation: : Oriented to Self, Oriented to Situation, Oriented to Place, Oriented to  Time Alcohol / Substance Use: Illicit Drugs Psych Involvement: No (comment)  Admission diagnosis:  Esophagitis [K20.90] Cannabinoid hyperemesis syndrome [R11.16] Hypertension, unspecified type [I10] Nausea and vomiting, unspecified vomiting type [R11.2] Patient Active Problem List   Diagnosis Date Noted   Increased anion gap metabolic acidosis 01/19/2024   Anemia due to stage 5 chronic kidney disease treated with erythropoietin (HCC) 11/18/2023   Cannabinoid hyperemesis syndrome 11/13/2023   Intractable nausea and vomiting 01/24/2023   Acute renal failure superimposed on stage 5 chronic kidney disease, not on chronic dialysis, unspecified acute renal failure type (HCC) 12/27/2022   Hypertensive urgency 12/27/2022   Vitamin D  deficiency 09/30/2022   Hypocalcemia 09/29/2022   Health care maintenance 09/01/2022   Marijuana use 08/31/2022   Lung nodule seen on imaging study 08/31/2022   CKD (chronic kidney disease) stage 5, GFR less than 15 ml/min (HCC) 04/05/2021   History of marijuana use    Hypertension    Esophagitis    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 [place], [place] - [address] [place] [address] Phone: [phone] Fax: [phone]  [place] [facility] [address] [place] [address] Phone: [phone] Fax: [phone]     Social Drivers of Health (SDOH) Social History: SDOH Screenings   Food Insecurity: No Food Insecurity (01/19/2024)  Housing: Low  Risk (01/19/2024)  Transportation Needs: No Transportation Needs (01/19/2024)  Utilities: Not At Risk (01/19/2024)  Depression (PHQ2-9): Low Risk (01/25/2023)  Tobacco Use: Low Risk (01/20/2024)   SDOH Interventions:     Readmission Risk Interventions    12/29/2022   10:15 AM 08/13/2022    1:20 PM  Readmission Risk Prevention Plan  Transportation Screening Complete Complete  PCP or Specialist Appt within 5-7 Days  Complete  Home Care Screening  Complete  Medication Review (RN CM)  Referral to Pharmacy  Medication Review (RN Care Manager) Complete   PCP or Specialist appointment within 3-5 days of discharge Complete   HRI or Home Care Consult Complete   SW Recovery Care/Counseling Consult Complete   Palliative Care Screening Not Applicable   Skilled Nursing Facility Not Applicable

## 2024-01-20 NOTE — Transfer of Care (Signed)
 Immediate Anesthesia Transfer of Care Note  Patient: [name]  Procedure(s) Performed: INSERTION, FISTULA, GRAFT (Left) INSERTION OF DIALYSIS CATHETER  Patient Location: PACU  Anesthesia Type:General  Level of Consciousness: awake, alert , oriented, and patient cooperative  Airway & Oxygen Therapy: Patient Spontanous Breathing and Patient connected to nasal cannula oxygen  Post-op Assessment: Report given to RN, Post -op Vital signs reviewed and stable, and Patient moving all extremities X 4  Post vital signs: Reviewed and stable  Last Vitals:  Vitals Value Taken Time  BP 130/82 01/20/24 19:15  Temp    Pulse 93 01/20/24 19:15  Resp 16 01/20/24 19:15  SpO2 100 % 01/20/24 19:15  Vitals shown include unfiled device data.  Last Pain:  Vitals:   01/20/24 1345  TempSrc:   PainSc: 0-No pain      Patients Stated Pain Goal: 0 (01/20/24 0215)  Complications: No notable events documented.

## 2024-01-20 NOTE — Anesthesia Postprocedure Evaluation (Signed)
"   Anesthesia Post Note  Patient: [name]  Procedure(s) Performed: INSERTION, FISTULA, GRAFT (Left) INSERTION OF DIALYSIS CATHETER     Patient location during evaluation: PACU Anesthesia Type: General Level of consciousness: awake and alert Pain management: pain level controlled Vital Signs Assessment: post-procedure vital signs reviewed and stable Respiratory status: spontaneous breathing, nonlabored ventilation and respiratory function stable Cardiovascular status: blood pressure returned to baseline and stable Postop Assessment: no apparent nausea or vomiting Anesthetic complications: no   No notable events documented.  Last Vitals:  Vitals:   01/20/24 1945 01/20/24 2016  BP:  (!) 145/74  Pulse: 92 86  Resp: 12 18  Temp: 36.8 C 36.7 C  SpO2: 100% 100%    Last Pain:  Vitals:   01/20/24 2026  TempSrc:   PainSc: 10-Worst pain ever                 [name],W. EDMOND      "

## 2024-01-20 NOTE — Progress Notes (Signed)
 Requested to see pt for out-pt HD needs at d/c. Met with pt at bedside. Introduced self and explained role. Discussed out-pt HD options with pt while nephrologist at bedside. At this time, both feel pt will be best served at an in-center 3x's a week clinic. Pt prefers a clinic close to her home and to stay with a [facility] provider. Referral submitted to [facility] admissions for review. Pt drives and plans to drive self to HD appts at d/c. Will assist as needed.   Randine Mungo Dialysis Navigator [phone]

## 2024-01-20 NOTE — TOC CAGE-AID Note (Signed)
 Transition of Care [facility]) - CAGE-AID Screening   Patient Details  Name: [name] MRN: [id number] Date of Birth: January 20, 1980  Transition of Care [facility]) CM/SW Contact:    Lendia Dais, LCSWA Phone Number: 01/20/2024, 4:39 PM   Clinical Narrative: CSW informed the pt of the consult for a CAGE AID and explained the purpose. Pt was agreeable to screening.  Pt stated that they smoke 3 blunts a day. No resources provide d/t not answering two or more questions with a yes.     CAGE-AID Screening:    Have You Ever Felt You Ought to Cut Down on Your Drinking or Drug Use?: No Have People Annoyed You By Critizing Your Drinking Or Drug Use?: No Have You Felt Bad Or Guilty About Your Drinking Or Drug Use?: No Have You Ever Had a Drink or Used Drugs First Thing In The Morning to Steady Your Nerves or to Get Rid of a Hangover?: Yes CAGE-AID Score: 1  Substance Abuse Education Offered: No  Substance abuse interventions: SDOH Screening

## 2024-01-20 NOTE — Anesthesia Procedure Notes (Signed)
 Procedure Name: LMA Insertion Date/Time: 01/20/2024 5:51 PM  Performed by: Marva Lonni PARAS, CRNAPre-anesthesia Checklist: Patient identified, Emergency Drugs available, Suction available and Patient being monitored Patient Re-evaluated:Patient Re-evaluated prior to induction Oxygen Delivery Method: Circle System Utilized Preoxygenation: Pre-oxygenation with 100% oxygen Induction Type: IV induction Ventilation: Mask ventilation without difficulty LMA: LMA inserted LMA Size: 4.0 Number of attempts: 1 Airway Equipment and Method: Bite block Placement Confirmation: positive ETCO2 Tube secured with: Tape Dental Injury: Teeth and Oropharynx as per pre-operative assessment

## 2024-01-20 NOTE — Op Note (Signed)
 "   Patient name: [name] MRN: [id number] DOB: November 20, 1980 Sex: female  01/20/2024 Pre-operative Diagnosis: ESRD Post-operative diagnosis:  Same Surgeon:  Penne C. Sheree, MD Assistant: Ahmed Holster, PA Procedure Performed: 1.  Placement of right IJ 19 cm tunneled dialysis catheter with ultrasound and fluoroscopic guidance 2.  Left upper extremity brachial artery to axillary vein 4-7 mm PTFE AV graft  Indications: 44 year old female history of end-stage renal disease now indicated for upper extremity access in her nondominant left upper extremity as well as tunneled dialysis catheter.  We have discussed risk benefits alternatives she demonstrates good understanding and agrees to proceed.  Given the complexity of the case,  the assistant was necessary in order to expedient the procedure and safely perform the technical aspects of the operation.  The assistant provided traction and countertraction to assist with exposure of the artery and vein.  They also assisted with suture ligation of multiple venous branches. They also assisted with tunneling of the graft.  They played a critical role for both anastomoses.. These skills, especially following the Prolene suture for the anastomosis, could not have been adequately performed by a scrub tech assistant.    Findings: Right IJ was patent and compressible and TDC was placed to the SVC atrial junction.  There was no suitable vein in the left upper extremity for fistula creation.  The axillary veins were repaired and the graft was sewn end to end to one of the veins and end-to-side to the 3 mm, nondiseased brachial artery above the antecubitum.  At completion there was Doppler flow in the axillary vein and radial artery pulse at the wrist which was weak but did augment with compression of the graft and this was confirmed with Doppler.   Procedure:  The patient was identified in the holding area and taken to the operating where she was placed upon  operative table general anesthesia was induced.  She were sterilely prepped and draped in the bilateral neck and chest and left upper extremity usual fashion, antibiotics were administered timeout was called.  Ultrasound was used to identify the right IJ which was patent and compressible and this was cannulated with a micropuncture followed by wire and sheath.  A Bentson wire was placed under fluoroscopic guidance into the IVC and the wire tract was serially dilated introducer sheath was placed under fluoroscopic guidance.  From a counterincision of 19 cm catheter was tunneled in place to the SVC atrial junction and the introducer sheath was removed.  The catheter was flushed with heparinized saline locked with 1.6 cc of concentrated heparin  both ports and affixed to the skin with nylon suture and the neck incision was closed with Monocryl Dermabond and a sterile dressing were placed.  Attention was then turned to the left upper extremity.  Ultrasound was used to evaluate the left upper extremity and no suitable surface veins were available.  A transverse incision was created above the antecubitum we dissected down to the brachial artery encircled this with Vesseloops proximally and distally.  At the axilla we dissected down to the axillary vein.  These were noted to be paired.  Multiple branches were divided between ties.  We then marked the vein for orientation transected and spatulated flush with heparinized saline and clamped.  A 4-7 mm PTFE graft was tunneled.  The graft was trimmed to size and the 7 mm end was sewn end-to-side to the axillary vein.  Upon completion we then flushed through the graft and reclamped the vein.  Brachial artery was clamped distally and proximally opened longitudinally and flushed with heparinized saline and distal direction.  The graft was then sewn at the 4 mm and an end-to-end fashion with 6-0 Prolene suture.  Prior to completion we have then allowed flushing maneuvers.  There  were strong Doppler flow in the axillary vein that was absent with compression of the graft and there was a strong radial artery signal and a weakly palpable radial pulse at the wrist without did augment with compression of the graft.  The wounds were irrigated hemostasis was obtained they were closed in layers of Vicryl and Monocryl.  Dermabond placed at the skin level.  The patient was awakened from anesthesia having tolerated the procedure without any complication.  WERE correct at completion.   EBL:  100cc   Melodee Lupe C. Sheree, MD [facility] Office: [phone] Pager: [phone]   "

## 2024-01-20 NOTE — Telephone Encounter (Signed)
 Auth Submission: NO AUTH NEEDED Site of care: Site of care: [facility] Payer: [employer] Medication & CPT/J Code(s) submitted: Feraheme  (ferumoxytol ) [id number] Diagnosis Code: N18.5/D63.1 Route of submission (phone, fax, portal): portal Phone # Fax # Auth type: Buy/Bill HB Units/visits requested: 510mg  x 2 doses Reference number:  Approval from: 01/20/24 to 04/19/24

## 2024-01-21 DIAGNOSIS — E1122 Type 2 diabetes mellitus with diabetic chronic kidney disease: Secondary | ICD-10-CM | POA: Diagnosis not present

## 2024-01-21 DIAGNOSIS — R1116 Cannabis hyperemesis syndrome: Secondary | ICD-10-CM | POA: Diagnosis not present

## 2024-01-21 DIAGNOSIS — Z95828 Presence of other vascular implants and grafts: Secondary | ICD-10-CM

## 2024-01-21 DIAGNOSIS — N185 Chronic kidney disease, stage 5: Secondary | ICD-10-CM | POA: Diagnosis not present

## 2024-01-21 DIAGNOSIS — Z9889 Other specified postprocedural states: Secondary | ICD-10-CM

## 2024-01-21 DIAGNOSIS — Z992 Dependence on renal dialysis: Secondary | ICD-10-CM

## 2024-01-21 DIAGNOSIS — D631 Anemia in chronic kidney disease: Secondary | ICD-10-CM | POA: Diagnosis not present

## 2024-01-21 DIAGNOSIS — K209 Esophagitis, unspecified without bleeding: Secondary | ICD-10-CM | POA: Diagnosis not present

## 2024-01-21 LAB — PREPARE RBC (CROSSMATCH)

## 2024-01-21 LAB — RENAL FUNCTION PANEL
Albumin: 3.7 g/dL (ref 3.5–5.0)
Anion gap: 14 (ref 5–15)
BUN: 43 mg/dL — ABNORMAL HIGH (ref 6–20)
CO2: 19 mmol/L — ABNORMAL LOW (ref 22–32)
Calcium: 6.5 mg/dL — ABNORMAL LOW (ref 8.9–10.3)
Chloride: 99 mmol/L (ref 98–111)
Creatinine, Ser: 7.1 mg/dL — ABNORMAL HIGH (ref 0.44–1.00)
GFR, Estimated: 7 mL/min — ABNORMAL LOW
Glucose, Bld: 167 mg/dL — ABNORMAL HIGH (ref 70–99)
Phosphorus: 3.8 mg/dL (ref 2.5–4.6)
Potassium: 3.9 mmol/L (ref 3.5–5.1)
Sodium: 132 mmol/L — ABNORMAL LOW (ref 135–145)

## 2024-01-21 LAB — CBC
HCT: 21.3 % — ABNORMAL LOW (ref 36.0–46.0)
Hemoglobin: 6.9 g/dL — CL (ref 12.0–15.0)
MCH: 26.1 pg (ref 26.0–34.0)
MCHC: 32.4 g/dL (ref 30.0–36.0)
MCV: 80.7 fL (ref 80.0–100.0)
Platelets: 240 K/uL (ref 150–400)
RBC: 2.64 MIL/uL — ABNORMAL LOW (ref 3.87–5.11)
RDW: 15.9 % — ABNORMAL HIGH (ref 11.5–15.5)
WBC: 7.9 K/uL (ref 4.0–10.5)
nRBC: 0 % (ref 0.0–0.2)

## 2024-01-21 LAB — GLUCOSE, CAPILLARY
Glucose-Capillary: 76 mg/dL (ref 70–99)
Glucose-Capillary: 131 mg/dL — ABNORMAL HIGH (ref 70–99)
Glucose-Capillary: 158 mg/dL — ABNORMAL HIGH (ref 70–99)

## 2024-01-21 LAB — HEMOGLOBIN AND HEMATOCRIT, BLOOD
HCT: 23.5 % — ABNORMAL LOW (ref 36.0–46.0)
Hemoglobin: 7.8 g/dL — ABNORMAL LOW (ref 12.0–15.0)

## 2024-01-21 LAB — HEPATITIS B SURFACE ANTIBODY, QUANTITATIVE: Hep B S AB Quant (Post): 4138 m[IU]/mL

## 2024-01-21 MED ORDER — ACETAMINOPHEN 500 MG PO TABS
1000.0000 mg | ORAL_TABLET | Freq: Four times a day (QID) | ORAL | Status: DC
  Administered 2024-01-21 – 2024-01-24 (×8): 1000 mg via ORAL
  Filled 2024-01-21 (×8): qty 2

## 2024-01-21 MED ORDER — OXYCODONE HCL 5 MG PO TABS
5.0000 mg | ORAL_TABLET | ORAL | Status: DC | PRN
  Administered 2024-01-21 – 2024-01-24 (×8): 5 mg via ORAL
  Filled 2024-01-21 (×8): qty 1

## 2024-01-21 MED ORDER — DARBEPOETIN ALFA 60 MCG/0.3ML IJ SOSY
60.0000 ug | PREFILLED_SYRINGE | INTRAMUSCULAR | Status: DC
  Administered 2024-01-21: 60 ug via SUBCUTANEOUS
  Filled 2024-01-21: qty 0.3

## 2024-01-21 MED ORDER — HEPARIN SODIUM (PORCINE) 1000 UNIT/ML IJ SOLN
INTRAMUSCULAR | Status: AC
  Filled 2024-01-21: qty 4

## 2024-01-21 MED ORDER — HYDROMORPHONE HCL 1 MG/ML IJ SOLN
1.0000 mg | Freq: Four times a day (QID) | INTRAMUSCULAR | Status: DC | PRN
  Administered 2024-01-21: 1 mg via INTRAVENOUS
  Filled 2024-01-21: qty 1

## 2024-01-21 MED ORDER — HALOPERIDOL LACTATE 5 MG/ML IJ SOLN: 1.0000 mg | Freq: Four times a day (QID) | INTRAMUSCULAR | Status: DC | PRN

## 2024-01-21 MED ORDER — HYDROMORPHONE HCL 2 MG PO TABS: 1.0000 mg | ORAL_TABLET | Freq: Four times a day (QID) | ORAL | Status: DC | PRN

## 2024-01-21 MED ORDER — SODIUM CHLORIDE 0.9% IV SOLUTION: Freq: Once | INTRAVENOUS | Status: DC

## 2024-01-21 MED ORDER — PANTOPRAZOLE SODIUM 40 MG PO TBEC
40.0000 mg | DELAYED_RELEASE_TABLET | Freq: Every day | ORAL | Status: DC
  Administered 2024-01-22 – 2024-01-24 (×3): 40 mg via ORAL
  Filled 2024-01-21 (×3): qty 1

## 2024-01-21 MED ORDER — HALOPERIDOL LACTATE 5 MG/ML IJ SOLN
1.0000 mg | Freq: Once | INTRAMUSCULAR | Status: AC
  Administered 2024-01-21: 1 mg via INTRAVENOUS
  Filled 2024-01-21: qty 1

## 2024-01-21 NOTE — Plan of Care (Signed)
   Problem: Coping: Goal: Ability to adjust to condition or change in health will improve Outcome: Progressing   Problem: Fluid Volume: Goal: Ability to maintain a balanced intake and output will improve Outcome: Progressing   Problem: Nutritional: Goal: Maintenance of adequate nutrition will improve Outcome: Progressing   Problem: Skin Integrity: Goal: Risk for impaired skin integrity will decrease Outcome: Progressing

## 2024-01-21 NOTE — Progress Notes (Signed)
 " [facility] Progress Note   Subjective:   s/p perm cath +AVG yesterday, had HD#1 this AM.  No new issues.    Objective Vitals:   01/21/24 0530 01/21/24 0600 01/21/24 0630 01/21/24 0750  BP: 138/78 (!) 147/75 (!) 135/90 (!) 159/89  Pulse: 90 100 90 (!) 101  Resp: 19 20 (!) 21 18  Temp:   97.7 F (36.5 C) 98 F (36.7 C)  TempSrc:   Oral   SpO2: 100% 100% 100% 100%  Weight:   71.9 kg   Height:       Physical Exam General: nontoxic appearing Heart:RRR Lungs:clear Abdomen:soft Extremities:no edema Dialysis Access: RIJ TDC c/d/I, LUE AVG +t/b, 2+ radial pulse  Additional Objective Labs: Basic Metabolic Panel: Recent Labs  Lab 01/19/24 0256 01/19/24 2148 01/20/24 0330  NA 141 137 136  K 4.5 4.2 4.3  CL 107 104 104  CO2 15* 16* 16*  GLUCOSE 202* 163* 131*  BUN 57* 56* 55*  CREATININE 7.37* 8.08* 8.15*  CALCIUM  8.3* 6.6* 6.5*  PHOS  --   --  5.4*   Liver Function Tests: Recent Labs  Lab 01/18/24 1626 01/20/24 0330  AST 26  --   ALT 14  --   ALKPHOS 96  --   BILITOT 0.4  --   PROT 8.4*  --   ALBUMIN 4.7 3.8   Recent Labs  Lab 01/18/24 1626  LIPASE 153*   CBC: Recent Labs  Lab 01/18/24 1626 01/18/24 1644 01/19/24 0256 01/19/24 0510 01/19/24 2148 01/20/24 0330 01/20/24 2240  WBC 7.2  --  9.8 8.2 6.3 4.5 10.3  NEUTROABS 6.3  --   --   --   --   --   --   HGB 9.5*   < > 8.9* 8.5* 7.5* 7.3* 7.4*  HCT 31.3*   < > 27.3* 26.6* 23.0* 22.5* 23.0*  MCV 85.3  --  79.4* 81.3 79.6* 80.1 81.0  PLT 221  --  349 277 233 245 263   < > = values in this interval not displayed.   Blood Culture    Component Value Date/Time   SDES BLOOD LEFT HAND 08/03/2019 1553   SPECREQUEST  08/03/2019 1553    BOTTLES DRAWN AEROBIC ONLY Blood Culture results may not be optimal due to an inadequate volume of blood received in culture bottles   CULT  08/03/2019 1553    NO GROWTH 5 DAYS Performed at [facility], [address] [place], [place] [address]     REPTSTATUS 08/08/2019 FINAL 08/03/2019 1553    Cardiac Enzymes: No results for input(s): CKTOTAL, CKMB, CKMBINDEX, TROPONINI in the last 168 hours. CBG: Recent Labs  Lab 01/20/24 1135 01/20/24 1315 01/20/24 1517 01/20/24 1917 01/20/24 2017  GLUCAP 96 98 97 99 123*   Iron  Studies:  Recent Labs    01/20/24 0817  IRON  121  TIBC 246*  FERRITIN 181   @[email or web address] @ Studies/Results: DG Chest Port 1 View Result Date: 01/20/2024 CLINICAL DATA:  Dialysis catheter insertion EXAM: PORTABLE CHEST 1 VIEW COMPARISON:  01/18/2024 FINDINGS: Interim right-sided central venous catheter with tip at the cavoatrial region. Normal cardiac size. No acute airspace disease, pleural effusion, or pneumothorax IMPRESSION: Interim right-sided central venous catheter with tip at the cavoatrial region. No pneumothorax. Electronically Signed   By: Luke Bun M.D.   On: 01/20/2024 19:39   HYBRID OR IMAGING ([facility] ONLY) Result Date: 01/20/2024 There is no interpretation for this exam.  This order is for  images obtained during a surgical procedure.  Please See Surgeries Tab for more information regarding the procedure.   Medications:  iron  sucrose 200 mg (01/21/24 0959)    amitriptyline   10 mg Oral QHS   amLODipine   10 mg Oral Daily   capsicum   Topical BID   Chlorhexidine  Gluconate Cloth  6 each Topical Daily   darbepoetin (ARANESP ) injection - DIALYSIS  60 mcg Subcutaneous Q Sat-1800   heparin   5,000 Units Subcutaneous Q8H   hydrALAZINE   25 mg Oral TID   insulin  aspart  0-6 Units Subcutaneous TID WC   pantoprazole  (PROTONIX ) IV  40 mg Intravenous Daily   sodium chloride  flush  10-40 mL Intracatheter Q12H     Assessment 65F presenting with hypertensive urgency, recurrent nausea/vomiting, uremia with advanced CKD 5.   CKD 5, uremic with nausea/vomiting and malaise: Patient agrees to initiate hemodialysis during admission. Nausea/vomiting: Likely from ongoing marijuana with uremia  contributing Metabolic acidosis: Driven by advanced CKD and placed on sodium bicarbonate  drip at admission, c continue to monitor, will be addressed by dialysis Anemia, borderline microcytic.  TSAT 49% 01/20/24 being treated with ESA to start 1/3 DM2 11/2023 A1c 5.5 Hypertension with urgency due to nonadherence, placed on labetalol  IV and home meds resumed; BP swung low, reduced meds   Plan VVS consulted for access, s/p TDC and AVG 1/2 Had 1st HD 1/3 AM - will plan next on 1/5 CLIP pending -remain inpatient until set Daily weights, Daily Renal Panel, Strict I/Os, Avoid nephrotoxins (NSAIDs, judicious IV Contrast)   Manuelita Barters MD 01/21/2024, 10:45 AM  [facility] Pager: [phone]   "

## 2024-01-21 NOTE — Progress Notes (Signed)
 "   HD#2 Subjective:   Summary: [name] is a 44 y.o. female with PMH of type 2 diabetes, CKD 5 not on HD, marijuana use, cannabinoid hyperemesis syndrome, substance use disorder, anemia of chronic disease, hypertension, asthma, anxiety and depression, who presented with abdominal pain, nausea, vomiting and admitted for intractable nausea.   Overnight Events: None  Interim history: Patient is evaluated bedside, denies any headaches, denies any nausea or vomiting.  Denies any abdominal pain.  Reports that she had HD session earlier this morning.  Tolerated well.  Objective:  Vital signs in last 24 hours: Vitals:   01/21/24 0430 01/21/24 0500 01/21/24 0530 01/21/24 0600  BP: 115/75 (!) 131/102 138/78 (!) 147/75  Pulse: (!) 106 94 90 100  Resp: 19 (!) 22 19 20   Temp:      TempSrc:      SpO2: 100% 100% 100% 100%  Weight:      Height:       Supplemental O2: Room Air SpO2: 100 % O2 Flow Rate (L/min): 2 L/min   Physical Exam:   Constitutional: Laying in bed, no apparent distress HENT: normocephalic atraumatic, mucous membranes moist Eyes: conjunctiva non-erythematous Neck: supple Cardiovascular: regular rate, + R TDC  Pulmonary/Chest: normal work of breathing on room air, lungs clear to auscultation bilaterally Abdominal: soft, non-tender, non-distended MSK: +LUE AV fistula with thrill  Neurological: alert & oriented x 3 Skin: warm and dry Psych: Pleasant  Filed Weights   01/20/24 0500 01/20/24 1306 01/21/24 0400  Weight: 71.9 kg 71.9 kg 71.9 kg     Intake/Output Summary (Last 24 hours) at 01/21/2024 0714 Last data filed at 01/20/2024 2237 Gross per 24 hour  Intake 300 ml  Output 300 ml  Net 0 ml   Net IO Since Admission: 1,047 mL [01/21/24 0714]  Pertinent Labs:    Latest Ref Rng & Units 01/20/2024   10:40 PM 01/20/2024    3:30 AM 01/19/2024    9:48 PM  CBC  WBC 4.0 - 10.5 K/uL 10.3  4.5  6.3   Hemoglobin 12.0 - 15.0 g/dL 7.4  7.3  7.5   Hematocrit 36.0 - 46.0 %  23.0  22.5  23.0   Platelets 150 - 400 K/uL 263  245  233        Latest Ref Rng & Units 01/20/2024    3:30 AM 01/19/2024    9:48 PM 01/19/2024    2:56 AM  CMP  Glucose 70 - 99 mg/dL 868  836  797   BUN 6 - 20 mg/dL 55  56  57   Creatinine 0.44 - 1.00 mg/dL 1.84  1.91  2.62   Sodium 135 - 145 mmol/L 136  137  141   Potassium 3.5 - 5.1 mmol/L 4.3  4.2  4.5   Chloride 98 - 111 mmol/L 104  104  107   CO2 22 - 32 mmol/L 16  16  15    Calcium  8.9 - 10.3 mg/dL 6.5  6.6  8.3     Imaging: DG Chest Port 1 View Result Date: 01/20/2024 CLINICAL DATA:  Dialysis catheter insertion EXAM: PORTABLE CHEST 1 VIEW COMPARISON:  01/18/2024 FINDINGS: Interim right-sided central venous catheter with tip at the cavoatrial region. Normal cardiac size. No acute airspace disease, pleural effusion, or pneumothorax IMPRESSION: Interim right-sided central venous catheter with tip at the cavoatrial region. No pneumothorax. Electronically Signed   By: Luke Bun M.D.   On: 01/20/2024 19:39   HYBRID OR IMAGING ([facility] ONLY)  Result Date: 01/20/2024 There is no interpretation for this exam.  This order is for images obtained during a surgical procedure.  Please See Surgeries Tab for more information regarding the procedure.    Assessment/Plan:   Principal Problem:   Cannabinoid hyperemesis syndrome Active Problems:   Type 2 diabetes mellitus (HCC)   Esophagitis   CKD (chronic kidney disease) stage 5, GFR less than 15 ml/min (HCC)   Hypertensive urgency   Increased anion gap metabolic acidosis   Patient Summary: [name] is a 44 y.o. 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   Cannabinoid hyperemesis syndrome Esophagitis Denies any symptoms of nausea, vomiting,  abdominal pain.  Tolerating p.o. well. - Amitriptyline  10 mg at bedtime - Capsicum topical ointment - P.o. Protonix  40 mg daily - As needed Zofran  every 8 hours as needed for nausea and vomiting  CKD stage V > ESRD on iHD  Status post AV graft and TDC on 01/20/2023, HD this AM through [facility]  - Next HD on 1/5 - Left arm AV graft can be used in 4 weeks  - Pain management with Tylenol  1000 mg every 6 hours, as needed oxy 5 mg every 4 hours - Pending CLIP - Monitor Daily I/Os, Daily weight  - Maintain MAP>65 for optimal renal perfusion.  - Avoid nephrotoxic agents such as IV contrast, NSAIDs, and phosphate containing bowel preps (FLEETS)  Hypertensive urgency now resolved Continue amlodipine  10 mg daily, hydralazine  25 mg 3 times daily  Anemia of chronic kidney disease Normocytic normochromic anemia Iron  121, TIBC 246, saturation 49%, ferritin 181 -IV Venofer  200 mg for 2 doses  -IM Aranesp  60 mcg every Saturday   Diet: Renal IVF: None VTE: Heparin  Wounds: Assessed by RN  Code: Full  Dispo: Anticipated discharge to Home in 3 days pending medical management.   Signature: Toma Edwards, D.O.  Internal Medicine Resident, PGY-2 [name]  7:14 AM, 01/21/2024   Please contact the on call pager after 5 pm and on weekends at [phone].  "

## 2024-01-21 NOTE — Progress Notes (Signed)
 Received patient in bed to unit.  Alert and oriented.  Informed consent signed and in chart.   TX duration: 2  Patient tolerated well.  Transported back to the room  Alert, without acute distress.  Hand-off given to patient's nurse.   Access used: Dialysis catheter Access issues: None  Total UF removed: 0 Medication(s) given: None Post HD VS: T97.7-HR90-RR21 B/P135/90 Post HD weight: 71.9kg  Neville Seip, RN [facility]   01/21/24 0630  Vitals  BP (!) 135/90  MAP (mmHg) 105  BP Location Right Arm  BP Method Automatic  Patient Position (if appropriate) Sitting  Pulse Rate 90  Pulse Rate Source Monitor  ECG Heart Rate 91  Resp (!) 21  Weight 71.9 kg  Type of Weight Post-Dialysis  Oxygen Therapy  SpO2 100 %  O2 Device Room Air  Patient Activity (if Appropriate) In bed  During Treatment Monitoring  Blood Flow Rate (mL/min) 199 mL/min  Arterial Pressure (mmHg) -102.21 mmHg  Venous Pressure (mmHg) 45.86 mmHg  TMP (mmHg) 6.26 mmHg  Ultrafiltration Rate (mL/min) 299 mL/min  Dialysate Flow Rate (mL/min) 299 ml/min  Dialysate Potassium Concentration 3  Dialysate Calcium  Concentration 2.5  Duration of HD Treatment -hour(s) 2 hour(s)  Cumulative Fluid Removed (mL) per Treatment  0.03  HD Safety Checks Performed Yes  Intra-Hemodialysis Comments Tolerated well;Tx completed  Post Treatment  Dialyzer Clearance Lightly streaked  Liters Processed 30  Fluid Removed (mL) 0 mL  Tolerated HD Treatment Yes  Post-Hemodialysis Comments Tolerated treatment well  Hemodialysis Catheter Right Internal jugular Double lumen Permanent (Tunneled)  Placement Date/Time: 01/20/24 1818   Placed prior to admission: No  Serial / Lot #: [id number]  Expiration Date: 02/18/28  Time Out: Correct patient;Correct site;Correct procedure  Maximum sterile barrier precautions: Hand hygiene;Large sterile sheet;S...  Site Condition No complications  Blue Lumen Status Flushed;Antimicrobial dead end  cap;Heparin  locked  Red Lumen Status Flushed;Antimicrobial dead end cap;Heparin  locked  Purple Lumen Status N/A  Catheter fill solution Heparin  1000 units/ml  Catheter fill volume (Arterial) 1.6 cc  Catheter fill volume (Venous) 1.6  Dressing Type Gauze/Drain sponge  Dressing Status Clean;Dry  Interventions New dressing  Drainage Description None  Dressing Change Due 01/28/24  Post treatment catheter status Capped and Clamped

## 2024-01-21 NOTE — Progress Notes (Signed)
" °  Progress Note    01/21/2024 10:07 AM 1 Day Post-Op  Subjective: Uncomfortable in hospital gown but otherwise no complaints  Vitals:   01/21/24 0630 01/21/24 0750  BP: (!) 135/90 (!) 159/89  Pulse: 90 (!) 101  Resp: (!) 21 18  Temp: 97.7 F (36.5 C) 98 F (36.7 C)  SpO2: 100% 100%    Physical Exam: Awake alert and oriented Nonlabored respirations Left arm incisions healing well with strong thrill and 2+ left radial pulse Right IJ tunneled catheter in place without evidence of hematoma or infection  CBC    Component Value Date/Time   WBC 10.3 01/20/2024 2240   RBC 2.84 (L) 01/20/2024 2240   HGB 7.4 (L) 01/20/2024 2240   HGB 8.5 (L) 08/31/2022 1620   HCT 23.0 (L) 01/20/2024 2240   HCT 29.2 (L) 08/31/2022 1620   PLT 263 01/20/2024 2240   PLT 428 08/31/2022 1620   MCV 81.0 01/20/2024 2240   MCV 77 (L) 08/31/2022 1620   MCH 26.1 01/20/2024 2240   MCHC 32.2 01/20/2024 2240   RDW 15.9 (H) 01/20/2024 2240   RDW 22.3 (H) 08/31/2022 1620   LYMPHSABS 0.7 01/18/2024 1626   MONOABS 0.1 01/18/2024 1626   EOSABS 0.0 01/18/2024 1626   BASOSABS 0.0 01/18/2024 1626    BMET    Component Value Date/Time   NA 136 01/20/2024 0330   NA 136 09/16/2022 1445   K 4.3 01/20/2024 0330   CL 104 01/20/2024 0330   CO2 16 (L) 01/20/2024 0330   GLUCOSE 131 (H) 01/20/2024 0330   BUN 55 (H) 01/20/2024 0330   BUN 40 (H) 09/16/2022 1445   CREATININE 8.15 (H) 01/20/2024 0330   CREATININE 1.24 (H) 06/17/2011 1051   CALCIUM  6.5 (L) 01/20/2024 0330   GFRNONAA 6 (L) 01/20/2024 0330   GFRAA 22 (L) 08/08/2019 0314    INR    Component Value Date/Time   INR 0.90 02/21/2012 0907     Intake/Output Summary (Last 24 hours) at 01/21/2024 1007 Last data filed at 01/21/2024 0630 Gross per 24 hour  Intake 300 ml  Output 300 ml  Net 0 ml     Assessment/plan:  44 y.o. female is s/p right IJ tunneled dialysis catheter placement and left arm AV graft.  TDC to be used now and graft can be used in  4 weeks.    [name] C. Sheree, MD [facility] Office: [phone] Pager: [phone]  01/21/2024 10:07 AM  "

## 2024-01-22 DIAGNOSIS — N185 Chronic kidney disease, stage 5: Secondary | ICD-10-CM | POA: Diagnosis not present

## 2024-01-22 DIAGNOSIS — E1122 Type 2 diabetes mellitus with diabetic chronic kidney disease: Secondary | ICD-10-CM | POA: Diagnosis not present

## 2024-01-22 DIAGNOSIS — K209 Esophagitis, unspecified without bleeding: Secondary | ICD-10-CM | POA: Diagnosis not present

## 2024-01-22 DIAGNOSIS — R1116 Cannabis hyperemesis syndrome: Secondary | ICD-10-CM | POA: Diagnosis not present

## 2024-01-22 LAB — GLUCOSE, CAPILLARY
Glucose-Capillary: 151 mg/dL — ABNORMAL HIGH (ref 70–99)
Glucose-Capillary: 108 mg/dL — ABNORMAL HIGH (ref 70–99)
Glucose-Capillary: 138 mg/dL — ABNORMAL HIGH (ref 70–99)
Glucose-Capillary: 127 mg/dL — ABNORMAL HIGH (ref 70–99)

## 2024-01-22 LAB — RENAL FUNCTION PANEL
Albumin: 3.6 g/dL (ref 3.5–5.0)
Anion gap: 14 (ref 5–15)
BUN: 47 mg/dL — ABNORMAL HIGH (ref 6–20)
CO2: 19 mmol/L — ABNORMAL LOW (ref 22–32)
Calcium: 6.4 mg/dL — CL (ref 8.9–10.3)
Chloride: 100 mmol/L (ref 98–111)
Creatinine, Ser: 7.53 mg/dL — ABNORMAL HIGH (ref 0.44–1.00)
GFR, Estimated: 6 mL/min — ABNORMAL LOW
Glucose, Bld: 111 mg/dL — ABNORMAL HIGH (ref 70–99)
Phosphorus: 5 mg/dL — ABNORMAL HIGH (ref 2.5–4.6)
Potassium: 4.1 mmol/L (ref 3.5–5.1)
Sodium: 134 mmol/L — ABNORMAL LOW (ref 135–145)

## 2024-01-22 LAB — TYPE AND SCREEN
ABO/RH(D): A POS
Antibody Screen: NEGATIVE
Unit division: 0

## 2024-01-22 LAB — CBC
HCT: 25.8 % — ABNORMAL LOW (ref 36.0–46.0)
Hemoglobin: 8.6 g/dL — ABNORMAL LOW (ref 12.0–15.0)
MCH: 27.1 pg (ref 26.0–34.0)
MCHC: 33.3 g/dL (ref 30.0–36.0)
MCV: 81.4 fL (ref 80.0–100.0)
Platelets: 201 K/uL (ref 150–400)
RBC: 3.17 MIL/uL — ABNORMAL LOW (ref 3.87–5.11)
RDW: 15.3 % (ref 11.5–15.5)
WBC: 6.9 K/uL (ref 4.0–10.5)
nRBC: 0 % (ref 0.0–0.2)

## 2024-01-22 LAB — BPAM RBC
Blood Product Expiration Date: 202601232359
ISSUE DATE / TIME: 202601031733
Unit Type and Rh: 6200

## 2024-01-22 LAB — VITAMIN D 25 HYDROXY (VIT D DEFICIENCY, FRACTURES): Vit D, 25-Hydroxy: 7.3 ng/mL — ABNORMAL LOW (ref 30–100)

## 2024-01-22 MED ORDER — CALCIUM CARBONATE ANTACID 500 MG PO CHEW
2.0000 | CHEWABLE_TABLET | Freq: Two times a day (BID) | ORAL | Status: DC
  Administered 2024-01-22 – 2024-01-24 (×5): 400 mg via ORAL
  Filled 2024-01-22 (×5): qty 2

## 2024-01-22 MED ORDER — HYDROMORPHONE HCL 1 MG/ML IJ SOLN
0.5000 mg | Freq: Once | INTRAMUSCULAR | Status: AC
  Administered 2024-01-22: 0.5 mg via INTRAVENOUS
  Filled 2024-01-22: qty 0.5

## 2024-01-22 MED ORDER — CALCITRIOL 0.25 MCG PO CAPS
0.2500 ug | ORAL_CAPSULE | ORAL | Status: DC
  Administered 2024-01-23: 0.25 ug via ORAL

## 2024-01-22 NOTE — Plan of Care (Signed)
  Problem: Education: Goal: Ability to describe self-care measures that may prevent or decrease complications (Diabetes Survival Skills Education) will improve Outcome: Progressing   Problem: Coping: Goal: Ability to adjust to condition or change in health will improve Outcome: Progressing

## 2024-01-22 NOTE — Progress Notes (Signed)
 " [facility] Progress Note   Subjective:   No new issues.  L AVG remains painful.  She's afebrile with normal WBC.  Asking if she'll be d/c'd prior to Thurs b/c it's her birthday.  Objective Vitals:   01/21/24 2023 01/21/24 2115 01/22/24 0421 01/22/24 0747  BP: 121/64 125/68 (!) 149/73 (!) 162/80  Pulse: 84 85 80 78  Resp: 19 16 19 18   Temp: 98.1 F (36.7 C) 98.3 F (36.8 C) 98.2 F (36.8 C) 98.6 F (37 C)  TempSrc: Oral Oral Oral   SpO2: 100% 100% 100% 100%  Weight:      Height:       Physical Exam General: nontoxic appearing Heart:RRR Lungs:clear Abdomen:soft Extremities:no edema Dialysis Access: RIJ TDC c/d/I, LUE AVG +t/b with mild erythema no drainage at distal incision site, no warmth, 2+ radial pulse  Additional Objective Labs: Basic Metabolic Panel: Recent Labs  Lab 01/20/24 0330 01/21/24 1349 01/22/24 0532  NA 136 132* 134*  K 4.3 3.9 4.1  CL 104 99 100  CO2 16* 19* 19*  GLUCOSE 131* 167* 111*  BUN 55* 43* 47*  CREATININE 8.15* 7.10* 7.53*  CALCIUM  6.5* 6.5* 6.4*  PHOS 5.4* 3.8 5.0*   Liver Function Tests: Recent Labs  Lab 01/18/24 1626 01/20/24 0330 01/21/24 1349 01/22/24 0532  AST 26  --   --   --   ALT 14  --   --   --   ALKPHOS 96  --   --   --   BILITOT 0.4  --   --   --   PROT 8.4*  --   --   --   ALBUMIN 4.7 3.8 3.7 3.6   Recent Labs  Lab 01/18/24 1626  LIPASE 153*   CBC: Recent Labs  Lab 01/18/24 1626 01/18/24 1644 01/19/24 2148 01/20/24 0330 01/20/24 2240 01/21/24 1349 01/21/24 2332 01/22/24 0532  WBC 7.2   < > 6.3 4.5 10.3 7.9  --  6.9  NEUTROABS 6.3  --   --   --   --   --   --   --   HGB 9.5*   < > 7.5* 7.3* 7.4* 6.9* 7.8* 8.6*  HCT 31.3*   < > 23.0* 22.5* 23.0* 21.3* 23.5* 25.8*  MCV 85.3   < > 79.6* 80.1 81.0 80.7  --  81.4  PLT 221   < > 233 245 263 240  --  201   < > = values in this interval not displayed.   Blood Culture    Component Value Date/Time   SDES BLOOD LEFT HAND 08/03/2019 1553    SPECREQUEST  08/03/2019 1553    BOTTLES DRAWN AEROBIC ONLY Blood Culture results may not be optimal due to an inadequate volume of blood received in culture bottles   CULT  08/03/2019 1553    NO GROWTH 5 DAYS Performed at [facility], [address], [place], [place] [address]    REPTSTATUS 08/08/2019 FINAL 08/03/2019 1553    Cardiac Enzymes: No results for input(s): CKTOTAL, CKMB, CKMBINDEX, TROPONINI in the last 168 hours. CBG: Recent Labs  Lab 01/20/24 2017 01/21/24 1159 01/21/24 1618 01/21/24 2158 01/22/24 0745  GLUCAP 123* 76 131* 158* 151*   Iron  Studies:  Recent Labs    01/20/24 0817  IRON  121  TIBC 246*  FERRITIN 181   @[email or web address] @ Studies/Results: DG Chest Port 1 View Result Date: 01/20/2024 CLINICAL DATA:  Dialysis catheter insertion EXAM: PORTABLE CHEST 1 VIEW  COMPARISON:  01/18/2024 FINDINGS: Interim right-sided central venous catheter with tip at the cavoatrial region. Normal cardiac size. No acute airspace disease, pleural effusion, or pneumothorax IMPRESSION: Interim right-sided central venous catheter with tip at the cavoatrial region. No pneumothorax. Electronically Signed   By: Luke Bun M.D.   On: 01/20/2024 19:39   HYBRID OR IMAGING ([facility] ONLY) Result Date: 01/20/2024 There is no interpretation for this exam.  This order is for images obtained during a surgical procedure.  Please See Surgeries Tab for more information regarding the procedure.   Medications:    sodium chloride    Intravenous Once   acetaminophen   1,000 mg Oral Q6H   amitriptyline   10 mg Oral QHS   amLODipine   10 mg Oral Daily   capsicum   Topical BID   Chlorhexidine  Gluconate Cloth  6 each Topical Daily   darbepoetin (ARANESP ) injection - DIALYSIS  60 mcg Subcutaneous Q Sat-1800   heparin   5,000 Units Subcutaneous Q8H   hydrALAZINE   25 mg Oral TID   insulin  aspart  0-6 Units Subcutaneous TID WC   pantoprazole   40 mg Oral Daily   sodium chloride  flush  10-40 mL  Intracatheter Q12H     Assessment 62F presenting with hypertensive urgency, recurrent nausea/vomiting, uremia with advanced CKD 5.   CKD 5, uremic with nausea/vomiting and malaise: Patient agrees to initiate hemodialysis during admission. S/p HD #1 on 1/3 Nausea/vomiting: Likely from ongoing marijuana with uremia contributing, improving Metabolic acidosis: Driven by advanced CKD, improved w HD Anemia, borderline microcytic.  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

## 2024-01-22 NOTE — Progress Notes (Signed)
 "   HD#3 Subjective:   Summary: [name] is a 44 y.o. female with PMH of type 2 diabetes, CKD 5 not on HD, marijuana use, cannabinoid hyperemesis syndrome, substance use disorder, anemia of chronic disease, hypertension, asthma, anxiety and depression, who presented with abdominal pain, nausea, vomiting and admitted for intractable nausea.   Overnight Events: None  Interim history: Patient is evaluated bedside, denies any chest pain, shortness of breath.  Denies any numbness, tingling, muscle cramps.  Denies any nausea vomiting or abdominal pain.  Does report left upper extremity pain at the fistula site.  Objective:  Vital signs in last 24 hours: Vitals:   01/21/24 2023 01/21/24 2115 01/22/24 0421 01/22/24 0747  BP: 121/64 125/68 (!) 149/73 (!) 162/80  Pulse: 84 85 80 78  Resp: 19 16 19 18   Temp: 98.1 F (36.7 C) 98.3 F (36.8 C) 98.2 F (36.8 C) 98.6 F (37 C)  TempSrc: Oral Oral Oral   SpO2: 100% 100% 100% 100%  Weight:      Height:       Supplemental O2: Room Air SpO2: 100 % O2 Flow Rate (L/min): 2 L/min   Physical Exam:   Constitutional: Laying in bed, no apparent distress HENT: normocephalic atraumatic, mucous membranes moist Eyes: conjunctiva non-erythematous Neck: supple Cardiovascular: regular rate, + R TDC  Pulmonary/Chest: normal work of breathing on room air, lungs clear to auscultation bilaterally Abdominal: soft, non-tender, non-distended MSK: +LUE AV fistula with thrill with localized edema and tender to touch  Neurological: alert & oriented x 3 Skin: warm and dry Psych: Pleasant  Filed Weights   01/20/24 1306 01/21/24 0400 01/21/24 0630  Weight: 71.9 kg 71.9 kg 71.9 kg     Intake/Output Summary (Last 24 hours) at 01/22/2024 1020 Last data filed at 01/22/2024 0421 Gross per 24 hour  Intake 426 ml  Output 0 ml  Net 426 ml   Net IO Since Admission: 1,473 mL [01/22/24 1020]  Pertinent Labs:    Latest Ref Rng & Units 01/22/2024    5:32 AM  01/21/2024   11:32 PM 01/21/2024    1:49 PM  CBC  WBC 4.0 - 10.5 K/uL 6.9   7.9   Hemoglobin 12.0 - 15.0 g/dL 8.6  7.8  6.9   Hematocrit 36.0 - 46.0 % 25.8  23.5  21.3   Platelets 150 - 400 K/uL 201   240        Latest Ref Rng & Units 01/22/2024    5:32 AM 01/21/2024    1:49 PM 01/20/2024    3:30 AM  CMP  Glucose 70 - 99 mg/dL 888  832  868   BUN 6 - 20 mg/dL 47  43  55   Creatinine 0.44 - 1.00 mg/dL 2.46  2.89  1.84   Sodium 135 - 145 mmol/L 134  132  136   Potassium 3.5 - 5.1 mmol/L 4.1  3.9  4.3   Chloride 98 - 111 mmol/L 100  99  104   CO2 22 - 32 mmol/L 19  19  16    Calcium  8.9 - 10.3 mg/dL 6.4  6.5  6.5     Imaging: No results found.   Assessment/Plan:   Principal Problem:   Cannabinoid hyperemesis syndrome Active Problems:   Type 2 diabetes mellitus (HCC)   Esophagitis   CKD (chronic kidney disease) stage 5, GFR less than 15 ml/min (HCC)   Hypertensive urgency   Increased anion gap metabolic acidosis   Patient Summary: [name]  is a 44 y.o. 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   Cannabinoid hyperemesis syndrome > now stable  Esophagitis Denies any symptoms of nausea, vomiting, abdominal pain.  Tolerating p.o. well. - Amitriptyline  10 mg at bedtime - Capsicum topical ointment - P.o. Protonix  40 mg daily - As needed Zofran  every 8 hours as needed for nausea and vomiting  CKD stage V > ESRD on iHD  Status post AV graft and TDC on 01/20/2023, HD on 1/3, tolerated well  - Next HD on 1/5 - Left arm AV graft can be used in 4 weeks  - Pain management with Tylenol  1000 mg every 6 hours, as needed oxy 5 mg every 4 hours - Pending CLIP - Monitor Daily I/Os, Daily weight  - Maintain MAP>65 for optimal renal perfusion.   - Avoid nephrotoxic agents such as IV contrast, NSAIDs, and phosphate containing bowel preps (FLEETS)  Hypocalcemia  vitamin D  deficiency Likely d/t to chronic kidney disease, denies any symptoms of numbness, tingling, muscle cramps. A & O x 4 Vitamin D  25 hydroxy 7.3 (low) - F/u PTH  - Calcitriol  0.25 mcg Monday Wednesday Friday - Calcium  carbonate 400 mg twice daily  HTN  Continue amlodipine  10 mg daily, hydralazine  25 mg 3 times daily  Anemia of chronic kidney disease Normocytic normochromic anemia Iron  121, TIBC 246, saturation 49%, ferritin 181 Hemoglobin 8.6, status post 1 packed RBC on 1/3. S/p IV Venofer  200 mg for 2 doses  -IM Aranesp  60 mcg every Saturday -Daily CBC, transfuse if Hgb < 7  ==================== Resolved conditions - Hypertensive urgency  Diet: Renal IVF: None VTE: Heparin  Wounds: Assessed by RN  Code: Full  Dispo: Anticipated discharge to Home in 3 days pending medical management.   Signature: Toma Edwards, D.O.  Internal Medicine Resident, PGY-2 [facility]  10:20 AM, 01/22/2024   Please contact the on call pager after 5 pm and on weekends at [phone].  "

## 2024-01-23 ENCOUNTER — Encounter (HOSPITAL_COMMUNITY): Payer: Self-pay | Admitting: Vascular Surgery

## 2024-01-23 DIAGNOSIS — E872 Acidosis, unspecified: Secondary | ICD-10-CM | POA: Diagnosis not present

## 2024-01-23 DIAGNOSIS — E1122 Type 2 diabetes mellitus with diabetic chronic kidney disease: Secondary | ICD-10-CM | POA: Diagnosis not present

## 2024-01-23 DIAGNOSIS — I120 Hypertensive chronic kidney disease with stage 5 chronic kidney disease or end stage renal disease: Secondary | ICD-10-CM | POA: Diagnosis not present

## 2024-01-23 DIAGNOSIS — D631 Anemia in chronic kidney disease: Secondary | ICD-10-CM | POA: Diagnosis not present

## 2024-01-23 DIAGNOSIS — K5900 Constipation, unspecified: Secondary | ICD-10-CM | POA: Diagnosis not present

## 2024-01-23 DIAGNOSIS — R1116 Cannabis hyperemesis syndrome: Secondary | ICD-10-CM | POA: Diagnosis not present

## 2024-01-23 DIAGNOSIS — E559 Vitamin D deficiency, unspecified: Secondary | ICD-10-CM | POA: Diagnosis not present

## 2024-01-23 DIAGNOSIS — N186 End stage renal disease: Secondary | ICD-10-CM

## 2024-01-23 DIAGNOSIS — I160 Hypertensive urgency: Secondary | ICD-10-CM | POA: Diagnosis not present

## 2024-01-23 DIAGNOSIS — Z992 Dependence on renal dialysis: Secondary | ICD-10-CM | POA: Diagnosis not present

## 2024-01-23 LAB — CBC
HCT: 25.9 % — ABNORMAL LOW (ref 36.0–46.0)
Hemoglobin: 8.5 g/dL — ABNORMAL LOW (ref 12.0–15.0)
MCH: 27.3 pg (ref 26.0–34.0)
MCHC: 32.8 g/dL (ref 30.0–36.0)
MCV: 83.3 fL (ref 80.0–100.0)
Platelets: 210 K/uL (ref 150–400)
RBC: 3.11 MIL/uL — ABNORMAL LOW (ref 3.87–5.11)
RDW: 15.5 % (ref 11.5–15.5)
WBC: 6.6 K/uL (ref 4.0–10.5)
nRBC: 0 % (ref 0.0–0.2)

## 2024-01-23 LAB — RENAL FUNCTION PANEL
Albumin: 3.5 g/dL (ref 3.5–5.0)
Anion gap: 13 (ref 5–15)
BUN: 50 mg/dL — ABNORMAL HIGH (ref 6–20)
CO2: 20 mmol/L — ABNORMAL LOW (ref 22–32)
Calcium: 6.8 mg/dL — ABNORMAL LOW (ref 8.9–10.3)
Chloride: 101 mmol/L (ref 98–111)
Creatinine, Ser: 7.95 mg/dL — ABNORMAL HIGH (ref 0.44–1.00)
GFR, Estimated: 6 mL/min — ABNORMAL LOW
Glucose, Bld: 103 mg/dL — ABNORMAL HIGH (ref 70–99)
Phosphorus: 5.8 mg/dL — ABNORMAL HIGH (ref 2.5–4.6)
Potassium: 4.3 mmol/L (ref 3.5–5.1)
Sodium: 133 mmol/L — ABNORMAL LOW (ref 135–145)

## 2024-01-23 LAB — GLUCOSE, CAPILLARY
Glucose-Capillary: 134 mg/dL — ABNORMAL HIGH (ref 70–99)
Glucose-Capillary: 108 mg/dL — ABNORMAL HIGH (ref 70–99)
Glucose-Capillary: 108 mg/dL — ABNORMAL HIGH (ref 70–99)
Glucose-Capillary: 161 mg/dL — ABNORMAL HIGH (ref 70–99)

## 2024-01-23 MED ORDER — VASOPRESSIN 20 UNIT/ML IV SOLN
INTRAVENOUS | Status: AC
  Filled 2024-01-23: qty 1

## 2024-01-23 MED ORDER — HEPARIN SODIUM (PORCINE) 1000 UNIT/ML IJ SOLN
3200.0000 [IU] | Freq: Once | INTRAMUSCULAR | Status: AC
  Administered 2024-01-23: 3200 [IU]

## 2024-01-23 MED ORDER — PROPOFOL 500 MG/50ML IV EMUL
INTRAVENOUS | Status: AC
  Filled 2024-01-23: qty 150

## 2024-01-23 MED ORDER — PHENYLEPHRINE HCL-NACL 20-0.9 MG/250ML-% IV SOLN
INTRAVENOUS | Status: AC
  Filled 2024-01-23: qty 250

## 2024-01-23 MED ORDER — POLYETHYLENE GLYCOL 3350 17 G PO PACK
17.0000 g | PACK | Freq: Every day | ORAL | Status: DC
  Administered 2024-01-23: 17 g via ORAL
  Filled 2024-01-23 (×2): qty 1

## 2024-01-23 MED ORDER — PROPOFOL 1000 MG/100ML IV EMUL
INTRAVENOUS | Status: AC
  Filled 2024-01-23: qty 300

## 2024-01-23 MED ORDER — PROPOFOL 1000 MG/100ML IV EMUL
INTRAVENOUS | Status: AC
  Filled 2024-01-23: qty 100

## 2024-01-23 MED ORDER — SENNA 8.6 MG PO TABS
1.0000 | ORAL_TABLET | Freq: Every day | ORAL | Status: DC
  Administered 2024-01-23: 8.6 mg via ORAL
  Filled 2024-01-23 (×2): qty 1

## 2024-01-23 NOTE — Progress Notes (Signed)
 "  HD#4 SUBJECTIVE:  Patient Summary: [name] is a 44 y.o. female with PMH of  CKD 5, marijuana use, cannabinoid hyperemesis syndrome, substance use disorder, anemia of chronic disease, hypertension, type 2 DM, asthma, anxiety and depression, who presented with abdominal pain, nausea, vomiting and admitted for [facility].  Overnight Events: none   Interim History:  Saw patient at bedside this a.m.  Reports pain around [facility] site which has remained about the same since [facility] was placed. Denies any abdominal pain, nausea, vomiting, CP, SOB.  Says she is feeling fine and ready to go home.  Denies having a BM throughout this admission. OBJECTIVE:  Vital Signs: Vitals:   01/22/24 1742 01/22/24 2005 01/23/24 0359 01/23/24 0816  BP: (!) 156/65 127/70 138/73 (!) 152/79  Pulse: 92 86 83 87  Resp: 18 19 19 19   Temp: 98 F (36.7 C) 98.2 F (36.8 C) 98.2 F (36.8 C) 98.3 F (36.8 C)  TempSrc:  Oral Oral   SpO2: 100% 100% 100% 100%  Weight:      Height:       Supplemental O2: Room Air SpO2: 100 % O2 Flow Rate (L/min): 2 L/min  Filed Weights   01/20/24 1306 01/21/24 0400 01/21/24 0630  Weight: 71.9 kg 71.9 kg 71.9 kg     Intake/Output Summary (Last 24 hours) at 01/23/2024 1003 Last data filed at 01/23/2024 0400 Gross per 24 hour  Intake --  Output 0 ml  Net 0 ml   Net IO Since Admission: 1,473 mL [01/23/24 1003]  Physical Exam: Physical Exam HENT:     Head: Normocephalic.  Cardiovascular:     Rate and Rhythm: Normal rate and regular rhythm.  Pulmonary:     Effort: Pulmonary effort is normal. No respiratory distress.     Breath sounds: Normal breath sounds.  Abdominal:     Palpations: Abdomen is soft.     Tenderness: There is no abdominal tenderness. There is no guarding or rebound.  Skin:    General: Skin is warm.  Neurological:     Mental Status: She is alert.     Patient Lines/Drains/Airways Status     Active Line/Drains/Airways     Name Placement date Placement time  Site Days   Peripheral IV 01/20/24 18 G Distal;Posterior;Right Forearm 01/20/24  1402  Forearm  3   Peripheral IV 01/21/24 22 G Posterior;Proximal;Right Forearm 01/21/24  1638  Forearm  2   Fistula / Graft Left Upper arm Arteriovenous vein graft 01/20/24  1829  Upper arm  3   Hemodialysis Catheter Right Internal jugular Double lumen Permanent (Tunneled) 01/20/24  1818  Internal jugular  3   Wound 01/20/24 1839 Surgical Closed Surgical Incision Chest Right 01/20/24  1839  Chest  3            Pertinent labs and imaging:      Latest Ref Rng & Units 01/23/2024    4:39 AM 01/22/2024    5:32 AM 01/21/2024   11:32 PM  CBC  WBC 4.0 - 10.5 K/uL 6.6  6.9    Hemoglobin 12.0 - 15.0 g/dL 8.5  8.6  7.8   Hematocrit 36.0 - 46.0 % 25.9  25.8  23.5   Platelets 150 - 400 K/uL 210  201         Latest Ref Rng & Units 01/23/2024    4:39 AM 01/22/2024    5:32 AM 01/21/2024    1:49 PM  CMP  Glucose 70 - 99 mg/dL 896  111  167   BUN 6 - 20 mg/dL 50  47  43   Creatinine 0.44 - 1.00 mg/dL 2.04  2.46  2.89   Sodium 135 - 145 mmol/L 133  134  132   Potassium 3.5 - 5.1 mmol/L 4.3  4.1  3.9   Chloride 98 - 111 mmol/L 101  100  99   CO2 22 - 32 mmol/L 20  19  19    Calcium  8.9 - 10.3 mg/dL 6.8  6.4  6.5     No results found.  ASSESSMENT/PLAN:  Assessment: Principal Problem:   Cannabinoid hyperemesis syndrome Active Problems:   Type 2 diabetes mellitus (HCC)   Esophagitis   CKD (chronic kidney disease) stage 5, GFR less than 15 ml/min (HCC)   Hypertensive urgency   Increased anion gap metabolic acidosis   ESRD (end stage renal disease) (HCC)   Plan:  Cannabinoid hyperemesis syndrome > now stable  Clinically stable.  Will continue treating with ongoing regimen. - Amitriptyline  10 mg at bedtime - Capsicum Topical Ointment - Zofran  every 8 hours as needed  ESRD on HD S/p AV graft and TDC on 01/20/2023.  Underwent first HD on 01/21/2024 which she tolerated well.  Next HD scheduled for today 01/23/2024.   Clinically stable to be discharged. discharge pending CLIP placement.  -HD scheduled today 01/23/2024 -Left arm AV graft can be used in 4 weeks -Stable to be discharged; discharge pending CLIP placement -Pain management: Tylenol  1000 mg every 6 hours, as needed oxy 5 mg every 4 hours. - Monitor Daily I/Os, Daily weight  - Maintain MAP>65 for optimal renal perfusion.  - Avoid nephrotoxic agents such as IV contrast, NSAIDs, and phosphate containing bowel preps (FLEETS)  Hypocalcemia Vitamin D  deficiency Likely 2/2 CKD.  PTH results pending. -Calcitriol  0.25 mcg MWF -Calcium  carbonate 400 mg twice daily  Hx HTN HTN urgency, resolved BP is controlled.  Continue amlodipine , hydralazine . - Continue amlodipine  10 mg daily - Continue hydralazine  25 mg 3 times daily  Constipation Will start her on Senokot daily. - Start Senokot 8.6 mg daily  Anemia of CKD Normocytic normochromic anemia S/p IV Venofer  200 mg x 2 doses during this admission.  Patient underwent transfusion with pRBCs on 01/21/2023.  Hemoglobin currently stable. -Daily CBC, transfuse if hemoglobin< 7 -IM Aranesp  60 mcg every Saturday   Best Practice: Diet: Renal IVF: None VTE: Heparin  Code: Full   Dispo: Anticipated discharge to Home pending CLIP placement.   Signature:  Rebecka Edgardo [facility]  10:03 AM, 01/23/2024  On Call pager [phone]  "

## 2024-01-23 NOTE — Plan of Care (Signed)
 " Problem: Education: Goal: Ability to describe self-care measures that may prevent or decrease complications (Diabetes Survival Skills Education) will improve Outcome: Progressing Goal: Individualized Educational Video(s) Outcome: Progressing   Problem: Coping: Goal: Ability to adjust to condition or change in health will improve Outcome: Progressing   Problem: Fluid Volume: Goal: Ability to maintain a balanced intake and output will improve Outcome: Progressing   Problem: Health Behavior/Discharge Planning: Goal: Ability to identify and utilize available resources and services will improve Outcome: Progressing Goal: Ability to manage health-related needs will improve Outcome: Progressing   Problem: Metabolic: Goal: Ability to maintain appropriate glucose levels will improve Outcome: Progressing   Problem: Nutritional: Goal: Maintenance of adequate nutrition will improve Outcome: Progressing Goal: Progress toward achieving an optimal weight will improve Outcome: Progressing   Problem: Skin Integrity: Goal: Risk for impaired skin integrity will decrease Outcome: Progressing   Problem: Tissue Perfusion: Goal: Adequacy of tissue perfusion will improve Outcome: Progressing   Problem: Health Behavior/Discharge Planning: Goal: Ability to manage health-related needs will improve Outcome: Progressing   Problem: Clinical Measurements: Goal: Ability to maintain clinical measurements within normal limits will improve Outcome: Progressing Goal: Will remain free from infection Outcome: Progressing Goal: Diagnostic test results will improve Outcome: Progressing Goal: Respiratory complications will improve Outcome: Progressing Goal: Cardiovascular complication will be avoided Outcome: Progressing   Problem: Activity: Goal: Risk for activity intolerance will decrease Outcome: Progressing   Problem: Nutrition: Goal: Adequate nutrition will be maintained Outcome:  Progressing   Problem: Coping: Goal: Level of anxiety will decrease Outcome: Progressing   Problem: Elimination: Goal: Will not experience complications related to bowel motility Outcome: Progressing Goal: Will not experience complications related to urinary retention Outcome: Progressing   Problem: Pain Managment: Goal: General experience of comfort will improve and/or be controlled Outcome: Progressing   Problem: Safety: Goal: Ability to remain free from injury will improve Outcome: Progressing   Problem: Skin Integrity: Goal: Risk for impaired skin integrity will decrease Outcome: Progressing   Problem: Education: Goal: Knowledge of disease and its progression will improve Outcome: Progressing Goal: Individualized Educational Video(s) Outcome: Progressing   Problem: Fluid Volume: Goal: Compliance with measures to maintain balanced fluid volume will improve Outcome: Progressing   Problem: Health Behavior/Discharge Planning: Goal: Ability to manage health-related needs will improve Outcome: Progressing   Problem: Nutritional: Goal: Ability to make healthy dietary choices will improve Outcome: Progressing   Problem: Clinical Measurements: Goal: Complications related to the disease process, condition or treatment will be avoided or minimized Outcome: Progressing   Problem: Education: Goal: Knowledge of the prescribed therapeutic regimen will improve Outcome: Progressing   Problem: Bowel/Gastric: Goal: Gastrointestinal status for postoperative course will improve Outcome: Progressing   Problem: Cardiac: Goal: Ability to maintain an adequate cardiac output Outcome: Progressing Goal: Will show no evidence of cardiac arrhythmias Outcome: Progressing   Problem: Nutritional: Goal: Will attain and maintain optimal nutritional status Outcome: Progressing   Problem: Neurological: Goal: Will regain or maintain usual level of consciousness Outcome: Progressing    Problem: Clinical Measurements: Goal: Ability to maintain clinical measurements within normal limits Outcome: Progressing Goal: Postoperative complications will be avoided or minimized Outcome: Progressing   Problem: Respiratory: Goal: Will regain and/or maintain adequate ventilation Outcome: Progressing Goal: Respiratory status will improve Outcome: Progressing   Problem: Skin Integrity: Goal: Demonstrates signs of wound healing without infection Outcome: Progressing   Problem: Urinary Elimination: Goal: Will remain free from infection Outcome: Progressing Goal: Ability to achieve and maintain adequate urine output Outcome: Progressing   "

## 2024-01-23 NOTE — Progress Notes (Addendum)
 Contacted [facility] admissions and local clinic this morning to inquire if pt has been accepted for out-pt HD at d/c. Will await confirmation/determination from [facility]. Will assist as needed.   Randine Mungo Dialysis Navigator [phone]  Addendum at 3:30 pm: Pt has been accepted at [facility] on MWF 11:45 am chair time. Pt can start on Wed and will need to arrive at 11:00 am for paperwork prior to treatment. Met with pt at bedside while receiving HD. Pt aware and agreeable to plans. Schedule letter provided to pt as well. HD arrangements added to AVS. Update provided to attending, nephrologist, pt's RN, and RN CM. Renal PA to send orders to clinic as well.

## 2024-01-23 NOTE — Plan of Care (Signed)
" °  Problem: Coping: Goal: Ability to adjust to condition or change in health will improve Outcome: Progressing   Problem: Fluid Volume: Goal: Ability to maintain a balanced intake and output will improve Outcome: Progressing   Problem: Health Behavior/Discharge Planning: Goal: Ability to identify and utilize available resources and services will improve Outcome: Progressing Goal: Ability to manage health-related needs will improve Outcome: Progressing   Problem: Health Behavior/Discharge Planning: Goal: Ability to manage health-related needs will improve Outcome: Progressing   Problem: Metabolic: Goal: Ability to maintain appropriate glucose levels will improve Outcome: Progressing   Problem: Nutritional: Goal: Maintenance of adequate nutrition will improve Outcome: Progressing Goal: Progress toward achieving an optimal weight will improve Outcome: Progressing   "

## 2024-01-23 NOTE — Progress Notes (Signed)
 " [facility] Progress Note   Subjective:   Seen and examined.  Feeling OK.  L AVG still a little painful but OK  Objective Vitals:   01/23/24 1430 01/23/24 1500 01/23/24 1530 01/23/24 1600  BP: (!) 132/109 (!) 170/81 (!) 166/80 (!) 175/79  Pulse: 89 94 95 83  Resp: 19 12 20 10   Temp:      TempSrc:      SpO2: 100% 100% 100% 100%  Weight:      Height:       Physical Exam General: nontoxic appearing Heart:RRR Lungs:clear Abdomen:soft Extremities:no edema Dialysis Access: RIJ TDC c/d/I, LUE AVG +t/b with mild erythema no drainage at distal incision site, some UE bruising no warmth, 2+ radial pulse  Additional Objective Labs: Basic Metabolic Panel: Recent Labs  Lab 01/21/24 1349 01/22/24 0532 01/23/24 0439  NA 132* 134* 133*  K 3.9 4.1 4.3  CL 99 100 101  CO2 19* 19* 20*  GLUCOSE 167* 111* 103*  BUN 43* 47* 50*  CREATININE 7.10* 7.53* 7.95*  CALCIUM  6.5* 6.4* 6.8*  PHOS 3.8 5.0* 5.8*   Liver Function Tests: Recent Labs  Lab 01/18/24 1626 01/20/24 0330 01/21/24 1349 01/22/24 0532 01/23/24 0439  AST 26  --   --   --   --   ALT 14  --   --   --   --   ALKPHOS 96  --   --   --   --   BILITOT 0.4  --   --   --   --   PROT 8.4*  --   --   --   --   ALBUMIN 4.7   < > 3.7 3.6 3.5   < > = values in this interval not displayed.   Recent Labs  Lab 01/18/24 1626  LIPASE 153*   CBC: Recent Labs  Lab 01/18/24 1626 01/18/24 1644 01/20/24 0330 01/20/24 2240 01/21/24 1349 01/21/24 2332 01/22/24 0532 01/23/24 0439  WBC 7.2   < > 4.5 10.3 7.9  --  6.9 6.6  NEUTROABS 6.3  --   --   --   --   --   --   --   HGB 9.5*   < > 7.3* 7.4* 6.9* 7.8* 8.6* 8.5*  HCT 31.3*   < > 22.5* 23.0* 21.3* 23.5* 25.8* 25.9*  MCV 85.3   < > 80.1 81.0 80.7  --  81.4 83.3  PLT 221   < > 245 263 240  --  201 210   < > = values in this interval not displayed.   Blood Culture    Component Value Date/Time   SDES BLOOD LEFT HAND 08/03/2019 1553   SPECREQUEST   08/03/2019 1553    BOTTLES DRAWN AEROBIC ONLY Blood Culture results may not be optimal due to an inadequate volume of blood received in culture bottles   CULT  08/03/2019 1553    NO GROWTH 5 DAYS Performed at [facility], [address] [place], [place] [address]    REPTSTATUS 08/08/2019 FINAL 08/03/2019 1553    Cardiac Enzymes: No results for input(s): CKTOTAL, CKMB, CKMBINDEX, TROPONINI in the last 168 hours. CBG: Recent Labs  Lab 01/22/24 1201 01/22/24 1743 01/22/24 2001 01/23/24 0818 01/23/24 1132  GLUCAP 108* 138* 127* 134* 108*   Iron  Studies:  No results for input(s): IRON , TIBC, TRANSFERRIN, FERRITIN in the last 72 hours.  @lablastinr3 @ Studies/Results: No results found.  Medications:    sodium chloride   Intravenous Once   acetaminophen   1,000 mg Oral Q6H   amitriptyline   10 mg Oral QHS   amLODipine   10 mg Oral Daily   calcitRIOL   0.25 mcg Oral Q M,W,F-HD   calcium  carbonate  2 tablet Oral BID   capsicum   Topical BID   Chlorhexidine  Gluconate Cloth  6 each Topical Daily   darbepoetin (ARANESP ) injection - DIALYSIS  60 mcg Subcutaneous Q Sat-1800   heparin   5,000 Units Subcutaneous Q8H   heparin  sodium (porcine)  3,200 Units Intracatheter Once   hydrALAZINE   25 mg Oral TID   insulin  aspart  0-6 Units Subcutaneous TID WC   pantoprazole   40 mg Oral Daily   polyethylene glycol  17 g Oral Daily   senna  1 tablet Oral Daily   sodium chloride  flush  10-40 mL Intracatheter Q12H     Assessment 55F presenting with hypertensive urgency, recurrent nausea/vomiting, uremia with advanced CKD 5.   CKD 5, uremic with nausea/vomiting and malaise: Patient agrees to initiate hemodialysis during admission. S/p HD #1 on 1/3 Nausea/vomiting: Likely from ongoing marijuana with uremia contributing, improving Metabolic acidosis: Driven by advanced CKD, improved w HD Anemia, borderline microcytic.  TSAT 49% 01/20/24 being treated with ESA started 1/3 DM2  11/2023 A1c 5.5 Hypertension with urgency due to nonadherence, placed on labetalol  IV and home meds resumed; BP swung low, reduced meds Hypocalcemia secondary to PTH resistance with ESRD - confirmed asymtpomatic, phos < 5.5, needs PTH updated   Plan  HD # today CLIP complete.  Excellent!  OK to d/c from renal perspective now Start po ca between meals and calcitriol  Check PTH 1 time IV pain med for AVG assoc pain - careful attn on f/u - slightly erythematous but no objective s/s infection Daily weights, Daily Renal Panel, Strict I/Os, Avoid nephrotoxins (NSAIDs, judicious IV Contrast)   Almarie Bonine MD 01/23/2024, 4:37 PM  [facility]    "

## 2024-01-23 NOTE — Progress Notes (Signed)
" °   01/23/24 1700  Vitals  Temp 98.9 F (37.2 C)  Pulse Rate 91  Resp 12  BP (!) 163/70  SpO2 100 %  O2 Device Room Air  Weight 67.6 kg  Oxygen Therapy  Patient Activity (if Appropriate) In bed  Post Treatment  Dialyzer Clearance Lightly streaked  Hemodialysis Intake (mL) 0 mL  Liters Processed 41.3  Fluid Removed (mL) 0 mL  Tolerated HD Treatment Yes   Received patient in bed to unit.  Alert and oriented.  Informed consent signed and in chart.   TX duration: 2.5  Patient tolerated well.  Transported back to the room  Alert, without acute distress.  Hand-off given to patient's nurse.   Access used: Yes Access issues: No  Total UF removed: 0 Medication(s) given: See MAR Post HD VS: See Above Grid Post HD weight: 67.6 kg   [name] "

## 2024-01-23 NOTE — Discharge Planning (Signed)
" °  [facility] Initial Dialysis Discharge Orders   Patient Name: [name]  Admission Date: 01/18/2024 Discharge Date: 01/23/2024 (tentatively) Dialysis Unit: [facility] - MWF - can start Wed 01/25/2024  Admitting Diagnosis: Uremia with vomiting new ESRD - due to T2DM, 1st HD 01/20/2024 hypocalcemia  Other PMHx: T2DM HTN cannabis hyperemesis syndrome  Discharge Labs: Basic Metabolic Panel: Recent Labs  Lab 01/21/24 1349 01/22/24 0532 01/23/24 0439  NA 132* 134* 133*  K 3.9 4.1 4.3  CL 99 100 101  CO2 19* 19* 20*  GLUCOSE 167* 111* 103*  BUN 43* 47* 50*  CREATININE 7.10* 7.53* 7.95*  CALCIUM  6.5* 6.4* 6.8*  PHOS 3.8 5.0* 5.8*   CBC: Recent Labs  Lab 01/18/24 1626 01/18/24 1644 01/20/24 0330 01/20/24 2240 01/21/24 1349 01/21/24 2332 01/22/24 0532 01/23/24 0439  WBC 7.2   < > 4.5 10.3 7.9  --  6.9 6.6  NEUTROABS 6.3  --   --   --   --   --   --   --   HGB 9.5*   < > 7.3* 7.4* 6.9* 7.8* 8.6* 8.5*  HCT 31.3*   < > 22.5* 23.0* 21.3* 23.5* 25.8* 25.9*  MCV 85.3   < > 80.1 81.0 80.7  --  81.4 83.3  PLT 221   < > 245 263 240  --  201 210   < > = values in this interval not displayed.   Dialysis Orders: 3d/week 180 dialyzer 3.5hr x 1, then 4 hours BFR 400 DFR A1.5 EDW 66kg 3K/2.5Ca bath TDC (placed 1/2. Also had LUE AVG placed on same day by Dr. Sheree - ok'd to use on 02/17/2024) no UF or Na profile  ALLERGIES: Shellfish, codeine, morphine   Dialysis Meds: - ESA: Mircera 100mcg IV q 2 weeks (start 01/28/2024)  S/p Aranesp  on 1/3, S/p 1U PRBCs on 1/3  - VDRA: Calcitriol  0.25mcg PO q HD, adjust per protocol  Other/Appts/Lab orders: - Check monthly labs on admit, check K weekly while on 3K bath   Lamarr JONELLE Boehringer, PA-C 01/23/2024, 3:25 PM  [facility] [phone]  "

## 2024-01-24 ENCOUNTER — Other Ambulatory Visit (HOSPITAL_COMMUNITY): Payer: Self-pay

## 2024-01-24 ENCOUNTER — Encounter (HOSPITAL_COMMUNITY): Payer: Self-pay | Admitting: Internal Medicine

## 2024-01-24 DIAGNOSIS — I120 Hypertensive chronic kidney disease with stage 5 chronic kidney disease or end stage renal disease: Secondary | ICD-10-CM | POA: Diagnosis not present

## 2024-01-24 DIAGNOSIS — D631 Anemia in chronic kidney disease: Secondary | ICD-10-CM | POA: Diagnosis not present

## 2024-01-24 DIAGNOSIS — R1116 Cannabis hyperemesis syndrome: Secondary | ICD-10-CM | POA: Diagnosis not present

## 2024-01-24 DIAGNOSIS — N186 End stage renal disease: Secondary | ICD-10-CM | POA: Diagnosis not present

## 2024-01-24 DIAGNOSIS — K209 Esophagitis, unspecified without bleeding: Secondary | ICD-10-CM | POA: Diagnosis not present

## 2024-01-24 DIAGNOSIS — Z8679 Personal history of other diseases of the circulatory system: Secondary | ICD-10-CM

## 2024-01-24 DIAGNOSIS — Z992 Dependence on renal dialysis: Secondary | ICD-10-CM | POA: Diagnosis not present

## 2024-01-24 LAB — CBC
HCT: 26.5 % — ABNORMAL LOW (ref 36.0–46.0)
Hemoglobin: 8.7 g/dL — ABNORMAL LOW (ref 12.0–15.0)
MCH: 27.1 pg (ref 26.0–34.0)
MCHC: 32.8 g/dL (ref 30.0–36.0)
MCV: 82.6 fL (ref 80.0–100.0)
Platelets: 170 K/uL (ref 150–400)
RBC: 3.21 MIL/uL — ABNORMAL LOW (ref 3.87–5.11)
RDW: 15.3 % (ref 11.5–15.5)
WBC: 6.1 K/uL (ref 4.0–10.5)
nRBC: 0 % (ref 0.0–0.2)

## 2024-01-24 LAB — PTH, INTACT AND CALCIUM
Calcium, Total (PTH): 6.6 mg/dL — CL (ref 8.7–10.2)
PTH: 314 pg/mL — ABNORMAL HIGH (ref 15–65)

## 2024-01-24 LAB — RENAL FUNCTION PANEL
Albumin: 3.4 g/dL — ABNORMAL LOW (ref 3.5–5.0)
Anion gap: 10 (ref 5–15)
BUN: 31 mg/dL — ABNORMAL HIGH (ref 6–20)
CO2: 26 mmol/L (ref 22–32)
Calcium: 7.6 mg/dL — ABNORMAL LOW (ref 8.9–10.3)
Chloride: 100 mmol/L (ref 98–111)
Creatinine, Ser: 5.34 mg/dL — ABNORMAL HIGH (ref 0.44–1.00)
GFR, Estimated: 9 mL/min — ABNORMAL LOW
Glucose, Bld: 105 mg/dL — ABNORMAL HIGH (ref 70–99)
Phosphorus: 4.4 mg/dL (ref 2.5–4.6)
Potassium: 4.4 mmol/L (ref 3.5–5.1)
Sodium: 136 mmol/L (ref 135–145)

## 2024-01-24 LAB — GLUCOSE, CAPILLARY
Glucose-Capillary: 164 mg/dL — ABNORMAL HIGH (ref 70–99)
Glucose-Capillary: 100 mg/dL — ABNORMAL HIGH (ref 70–99)

## 2024-01-24 MED ORDER — CALCITRIOL 0.25 MCG PO CAPS
0.2500 ug | ORAL_CAPSULE | ORAL | 0 refills | Status: AC
  Filled 2024-01-24: qty 12, 28d supply, fill #0

## 2024-01-24 MED ORDER — PANTOPRAZOLE SODIUM 40 MG PO TBEC
40.0000 mg | DELAYED_RELEASE_TABLET | Freq: Every day | ORAL | 0 refills | Status: AC
  Filled 2024-01-24: qty 30, 30d supply, fill #0

## 2024-01-24 MED ORDER — CALCIUM CARBONATE ANTACID 500 MG PO CHEW
2.0000 | CHEWABLE_TABLET | Freq: Two times a day (BID) | ORAL | 0 refills | Status: AC
  Filled 2024-01-24: qty 120, 30d supply, fill #0

## 2024-01-24 MED ORDER — AMITRIPTYLINE HCL 10 MG PO TABS
10.0000 mg | ORAL_TABLET | Freq: Every day | ORAL | 0 refills | Status: AC
  Filled 2024-01-24: qty 30, 30d supply, fill #0

## 2024-01-24 MED ORDER — CARVEDILOL 25 MG PO TABS: 25.0000 mg | ORAL_TABLET | Freq: Two times a day (BID) | ORAL | Status: DC

## 2024-01-24 MED ORDER — CARVEDILOL 25 MG PO TABS
25.0000 mg | ORAL_TABLET | Freq: Two times a day (BID) | ORAL | 0 refills | Status: AC
  Filled 2024-01-24: qty 60, 30d supply, fill #0

## 2024-01-24 NOTE — Discharge Summary (Signed)
 "  Name: [name] MRN: [id number] DOB: 1980-06-18 44 y.o. PCP: Edgardo Pontiff, DO  Date of Admission: 01/18/2024  3:36 PM Date of Discharge: 01/24/24 Attending Physician: Dr. Jone Dauphin  Discharge Diagnosis: 1. Principal Problem:   Cannabinoid hyperemesis syndrome Active Problems:   Type 2 diabetes mellitus (HCC)   Esophagitis   CKD (chronic kidney disease) stage 5, GFR less than 15 ml/min (HCC)   Hypertensive urgency   Increased anion gap metabolic acidosis   ESRD (end stage renal disease) (HCC)   Discharge Medications: Allergies as of 01/24/2024       Reactions   Shellfish Allergy Anaphylaxis, Swelling   Morphine  And Codeine Nausea And Vomiting        Medication List     STOP taking these medications    hydrALAZINE  100 MG tablet Commonly known as: APRESOLINE    isosorbide  mononitrate 60 MG 24 hr tablet Commonly known as: IMDUR    metoprolol  succinate 50 MG 24 hr tablet Commonly known as: TOPROL -XL   ondansetron  4 MG tablet Commonly known as: Zofran        TAKE these medications    amitriptyline  10 MG tablet Commonly known as: ELAVIL  Take 1 tablet (10 mg total) by mouth at bedtime.   amLODipine  10 MG tablet Commonly known as: NORVASC  Take 1 tablet (10 mg total) by mouth daily.   calcitRIOL  0.25 MCG capsule Commonly known as: ROCALTROL  Take 1 capsule (0.25 mcg total) by mouth every Monday, Wednesday, and Friday with hemodialysis. Start taking on: January 25, 2024   Calcium  Antacid 500 MG chewable tablet Generic drug: calcium  carbonate Chew 2 tablets (400 mg of elemental calcium  total) by mouth 2 (two) times daily. What changed:  how much to take when to take this   carvedilol  25 MG tablet Commonly known as: COREG  Take 1 tablet (25 mg total) by mouth 2 (two) times daily with a meal.   pantoprazole  40 MG tablet Commonly known as: PROTONIX  Take 1 tablet (40 mg total) by mouth daily.   sodium bicarbonate  650 MG tablet Take 2 tablets (1,300 mg  total) by mouth 2 (two) times daily.        Disposition and follow-up:   [name] was discharged from [facility] in Good condition.  At the Yates follow up visit please address:  1.-sHe is following up with HD on MWF at outpatient center - Discuss resources to help with marijuana cessation  2.  Labs / imaging needed at time of follow-up: None  3.  Pending labs/ test needing follow-up: PTH  Follow-up Appointments:  Follow-up Information     Center, [facility] Go on 01/25/2024.   Why: Schedule is Monday, Wednesday, Friday wtih 11:45 am start time.  On Wednesday (1/7), plesae arrive at 11:00 am to complete paperwork prior to treatment. Contact information: [address] [place] [address] [phone]                  Yates Course by problem list: [name] is a 44 y.o. female with PMH of  CKD 5, marijuana use, cannabinoid hyperemesis syndrome, substance use disorder, anemia of chronic disease, hypertension, type 2 DM, asthma, anxiety and depression, who presented with abdominal pain, nausea, vomiting and admitted for [name].  Cannabinoid hyperemesis syndrome Esophagitis Patient presented with intractable abdominal pain, nausea, vomiting.  Symptoms had started abruptly prior to her admission.  Her symptoms were likely due to her continued marijuana use.  Patient has had multiple admissions for the same with the  last admission being in October 2025.  She was initially treated with capsaicin  cream, Ativan  as needed.  Patient reported that Ativan  had helped her in the past.  Discontinued Ativan  and started her on amitriptyline  for long-term management and prevention of CHS.  Patient continued to improve with symptoms of N/V/abdominal pain completely resolving.  She was in good condition on discharge. - Amitriptyline  10 mg daily at bedtime -P.o. Protonix  40 mg daily  ESRD on HD s/p TDC and aVF on 01/20/2024 Patient presented with  extremely low GFR, and elevated BUN and creatinine levels that were consistent with CKD stage V.  She had previously seen vascular surgery for AV fistula planning with likely need for dialysis.  Nephrology was consulted.  She underwent AV graft placement along with left-sided TDC on 01/20/2023.  Patient underwent her first HD on 01/21/2024 with a repeat on 01/23/2024.  She tolerated both the sessions well.  Per vascular surgery, her left-sided AV fistula could be used in 4 weeks.  Patient was discharged after CLIP placement with [facility].  Informed her that next HD at [facility] would be on tomorrow 01/25/24.  Discussed with patient it was important for her to be adherent to her HD which was scheduled for MWF.  Patient stated understanding.  HTN urgency, resolved Patient came in with a BP of 220/110s in the ED.  Reported she had not been taking her antihypertensive for the past few months.  Her blood pressure is likely elevated due to medication nonadherence and stress response from active vomiting.  She received IV hydralazine  from ED PA which only mildly improved BP.  Patient's BP continued to improve throughout her admission and she was started on home 10 mg amlodipine  and Imdur .  Discontinued Imdur  upon discharge and started her on carvedilol . - Continue amlodipine  10 mg daily -Start taking carvedilol  25 mg twice daily  Hypocalcemia Vitamin D  deficiency Likely due to chronic kidney disease.  Patient denied any symptoms of numbness, tingling, muscle cramps.  Vitamin D  hydroxy levels were low at 7.3 - F/U PTH - Calcitriol  0.25 mcg MWF with HD - Calcium  carbonate 100 mg twice daily  Anemia of CKD Normocytic normochromic anemia Iron  studies were as follows: Iron  121, TIBC 246, saturation 49%, ferritin 181; patient underwent transfusion with 1 packed RBC on 01/21/2024 for low hemoglobin levels.  Patient received IV Venofer  200 mg for 2 doses. -Monitor CBC regularly   Subjective Patient reports feeling well.  Denies any  nausea, vomiting, abdominal pain.  Reports no acute complaints.  Discharge Exam:   BP (!) 168/82 (BP Location: Right Arm)   Pulse 86   Temp 98.2 F (36.8 C)   Resp 19   Ht 5' 7 (1.702 m)   Wt 67.6 kg   SpO2 100%   BMI 23.34 kg/m  Discharge exam: GEN: A&O x 3, resting comfortably in bed, Abdominal: Soft, no TTP, no guarding, no rebound Skin: No signs of infection or discharge noted TDC site placement. Cardiovascular: RRR, M/R/G Pulmonary: CTAB  Pertinent Labs, Studies, and Procedures:     Latest Ref Rng & Units 01/24/2024    3:30 AM 01/23/2024    4:39 AM 01/22/2024    5:32 AM  CBC  WBC 4.0 - 10.5 K/uL 6.1  6.6  6.9   Hemoglobin 12.0 - 15.0 g/dL 8.7  8.5  8.6   Hematocrit 36.0 - 46.0 % 26.5  25.9  25.8   Platelets 150 - 400 K/uL 170  210  201  Latest Ref Rng & Units 01/24/2024    3:30 AM 01/23/2024    4:39 AM 01/22/2024    5:32 AM  CMP  Glucose 70 - 99 mg/dL 894  896  888   BUN 6 - 20 mg/dL 31  50  47   Creatinine 0.44 - 1.00 mg/dL 4.65  2.04  2.46   Sodium 135 - 145 mmol/L 136  133  134   Potassium 3.5 - 5.1 mmol/L 4.4  4.3  4.1   Chloride 98 - 111 mmol/L 100  101  100   CO2 22 - 32 mmol/L 26  20  19    Calcium  8.9 - 10.3 mg/dL 7.6  6.8    6.6  6.4     DG Chest Port 1 View Result Date: 01/18/2024 EXAM: 1 VIEW(S) XRAY OF THE CHEST 01/18/2024 11:27:16 PM COMPARISON: 11/13/2023 CLINICAL HISTORY: HTN FINDINGS: LUNGS AND PLEURA: No focal pulmonary opacity. No pleural effusion. No pneumothorax. HEART AND MEDIASTINUM: No acute abnormality of the cardiac and mediastinal silhouettes. BONES AND SOFT TISSUES: No acute osseous abnormality. IMPRESSION: 1. No acute cardiopulmonary process. Electronically signed by: Greig Pique MD 01/18/2024 11:58 PM EST RP Workstation: [id number]   CT ABDOMEN PELVIS WO CONTRAST Result Date: 01/18/2024 EXAM: CT ABDOMEN AND PELVIS WITHOUT CONTRAST 01/18/2024 11:14:56 PM TECHNIQUE: CT of the abdomen and pelvis was performed without the administration  of intravenous contrast. Multiplanar reformatted images are provided for review. Automated exposure control, iterative reconstruction, and/or weight-based adjustment of the mA/kV was utilized to reduce the radiation dose to as low as reasonably achievable. COMPARISON: CT chest abdomen and pelvis 11/13/2023. CLINICAL HISTORY: Abdominal pain, acute, nonlocalized. FINDINGS: LOWER CHEST: No acute abnormality. LIVER: The liver is unremarkable. GALLBLADDER AND BILE DUCTS: Gallbladder is unremarkable. No biliary ductal dilatation. SPLEEN: No acute abnormality. PANCREAS: No acute abnormality. ADRENAL GLANDS: No acute abnormality. KIDNEYS, URETERS AND BLADDER: No stones in the kidneys or ureters. No hydronephrosis. No perinephric or periureteral stranding. Urinary bladder is unremarkable. GI AND BOWEL: Stomach demonstrates no acute abnormality. There is wall thickening of the distal esophagus. The appendix appears normal. There is no bowel obstruction. PERITONEUM AND RETROPERITONEUM: No ascites. No free air. VASCULATURE: Aorta is normal in caliber. LYMPH NODES: No lymphadenopathy. REPRODUCTIVE ORGANS: No acute abnormality. BONES AND SOFT TISSUES: No acute osseous abnormality. No focal soft tissue abnormality. IMPRESSION: 1. Wall thickening of the distal esophagus worrisome for esophagitis . 2. Normal appendix. Electronically signed by: Greig Pique MD 01/18/2024 11:56 PM EST RP Workstation: [id number]   CT Head Wo Contrast Result Date: 01/18/2024 EXAM: CT HEAD WITHOUT CONTRAST 01/18/2024 10:47:30 PM TECHNIQUE: CT of the head was performed without the administration of intravenous contrast. Automated exposure control, iterative reconstruction, and/or weight based adjustment of the mA/kV was utilized to reduce the radiation dose to as low as reasonably achievable. COMPARISON: 09/22/2020 CLINICAL HISTORY: Neuro deficit, acute, stroke suspected. FINDINGS: BRAIN AND VENTRICLES: No acute hemorrhage. No evidence of acute  infarct. No hydrocephalus. No extra-axial collection. No mass effect or midline shift. ORBITS: No acute abnormality. SINUSES: No acute abnormality. SOFT TISSUES AND SKULL: No acute soft tissue abnormality. No skull fracture. IMPRESSION: 1. No acute intracranial abnormality. Electronically signed by: Franky Stanford MD 01/18/2024 11:08 PM EST RP Workstation: [id number]     Discharge Instructions: Discharge Instructions     Call MD for:  persistant dizziness or light-headedness   Complete by: As directed    Call MD for:  redness, tenderness, or signs of infection (pain, swelling, redness, odor or  green/yellow discharge around incision site)   Complete by: As directed    Call MD for:  severe uncontrolled pain   Complete by: As directed    Increase activity slowly   Complete by: As directed    No wound care   Complete by: As directed        Signed: Edgardo Pontiff, DO 01/24/2024, 6:03 PM     "

## 2024-01-24 NOTE — Progress Notes (Signed)
 DISCHARGE NOTE HOME [name] to be discharged Home per MD order. Discussed prescriptions and follow up appointments with the patient. Prescriptions given to patient; medication list explained in detail. Patient verbalized understanding.  Skin clean, dry and intact without evidence of skin break down, no evidence of skin tears noted. IV catheter discontinued intact. Site without signs and symptoms of complications. Dressing and pressure applied. Pt denies pain at the site currently. No complaints noted.  Patient free of lines, drains, and wounds.   An After Visit Summary (AVS) was printed and given to the patient. Patient escorted via wheelchair, and discharged home via private auto.  [name], RN

## 2024-01-24 NOTE — TOC Transition Note (Signed)
 Transition of Care [facility]) - Discharge Note   Patient Details  Name: [name] MRN: [id number] Date of Birth: 09-30-1980  Transition of Care [facility]) CM/SW Contact:  Tom-Johnson, Tremel Setters Daphne, RN Phone Number: 01/24/2024, 12:20 PM   Clinical Narrative:     Patient is scheduled for discharge today.  Readmission Risk Assessment done. Outpatient f/u, hospital f/u and discharge instructions on AVS. Prescriptions sent to [facility] pharmacy and patient will receive meds prior discharge. No ICM needs or recommendations noted. Significant other, John at bedside and will transport at discharge.  No further ICM needs noted.     Final next level of care: Home/Self Care Barriers to Discharge: Barriers Resolved   Patient Goals and CMS Choice Patient states their goals for this hospitalization and ongoing recovery are:: To return home CMS Medicare.gov Compare Post Acute Care list provided to:: Patient Choice offered to / list presented to : NA      Discharge Placement                Patient to be transferred to facility by: Significant Other Name of family member notified: John    Discharge Plan and Services Additional resources added to the After Visit Summary for   In-house Referral: Clinical Social Work, Orthoptist              DME Arranged: N/A DME Agency: NA       HH Arranged: NA HH Agency: NA        Social Drivers of Health (SDOH) Interventions SDOH Screenings   Food Insecurity: No Food Insecurity (01/19/2024)  Housing: Low Risk (01/19/2024)  Transportation Needs: No Transportation Needs (01/19/2024)  Utilities: Not At Risk (01/19/2024)  Depression (PHQ2-9): Low Risk (01/25/2023)  Tobacco Use: Low Risk (01/20/2024)     Readmission Risk Interventions    01/24/2024   12:20 PM 12/29/2022   10:15 AM 08/13/2022    1:20 PM  Readmission Risk Prevention Plan  Transportation Screening Complete Complete Complete  PCP or Specialist Appt within 5-7 Days   Complete  PCP or  Specialist Appt within 3-5 Days Complete    Home Care Screening   Complete  Medication Review (RN CM)   Referral to Pharmacy  HRI or Home Care Consult Complete    Social Work Consult for Recovery Care Planning/Counseling Complete    Palliative Care Screening Not Applicable    Medication Review Oceanographer) Referral to Pharmacy Complete   PCP or Specialist appointment within 3-5 days of discharge  Complete   HRI or Home Care Consult  Complete   SW Recovery Care/Counseling Consult  Complete   Palliative Care Screening  Not Applicable   Skilled Nursing Facility  Not Applicable

## 2024-01-24 NOTE — Progress Notes (Signed)
 D/C order noted. Contacted [facility] to be advised of pt's d/c today and that pt should start tomorrow as planned. HD arrangements added to pt's AVS and navigator met with pt yesterday to discuss arrangements. Pt agreeable to plans.   Randine Mungo Dialysis Navigator [phone]

## 2024-01-24 NOTE — TOC Progression Note (Signed)
 Transition of Care [facility]) - Progression Note    Patient Details  Name: [name] MRN: [id number] Date of Birth: 1980/01/21  Transition of Care [facility]) CM/SW Contact  Lendia Dais, [place] Phone Number: 01/24/2024, 11:22 AM  Clinical Narrative: CSW was informed by MD that the pt inquired about disability paperwork.   CSW met with the pt at bedside and gave the pt a print off of the instructions for apply for disability.  No further TOC needs.    Expected Discharge Plan: Home/Self Care                 Expected Discharge Plan and Services In-house Referral: Clinical Social Work, Chaplain     Living arrangements for the past 2 months: Single Family Home                                       Social Drivers of Health (SDOH) Interventions SDOH Screenings   Food Insecurity: No Food Insecurity (01/19/2024)  Housing: Low Risk (01/19/2024)  Transportation Needs: No Transportation Needs (01/19/2024)  Utilities: Not At Risk (01/19/2024)  Depression (PHQ2-9): Low Risk (01/25/2023)  Tobacco Use: Low Risk (01/20/2024)    Readmission Risk Interventions    12/29/2022   10:15 AM 08/13/2022    1:20 PM  Readmission Risk Prevention Plan  Transportation Screening Complete Complete  PCP or Specialist Appt within 5-7 Days  Complete  Home Care Screening  Complete  Medication Review (RN CM)  Referral to Pharmacy  Medication Review (RN Care Manager) Complete   PCP or Specialist appointment within 3-5 days of discharge Complete   HRI or Home Care Consult Complete   SW Recovery Care/Counseling Consult Complete   Palliative Care Screening Not Applicable   Skilled Nursing Facility Not Applicable

## 2024-01-24 NOTE — Discharge Instructions (Addendum)
 To [name] or their caretakers,  You were recently admitted to [facility] for nausea/vomiting due to your cannabis use.   Continue taking your home medications with the following changes:  Start taking Carvedilol  25 mg: Take 1 tablet by mouth 2 times daily--this will be for your blood pressure treatment Amitriptyline  10 mg: Take 1 tablet by mouth at bedtime-this will been treating your condition with cannabis use Calcitriol  0.25 mcg: Take 1 capsule by mouth every Monday, Wednesday, Friday with hemodialysis-for low vitamin D  levels Calcium  carbonate 500 mg: Take 2 tablets by mouth 2 times daily to help with low vitamin D  levels Continue taking:  A.  Amlodipine  10 mg: take 1 tablet by mouth daily  B.  Pantoprazole  40 mg: Take 1 tablet daily  C.  Sodium bicarbonate  650 mg tablet: Take 2 tablets by mouth 2 times daily  2. Stop taking  A.  Hydralazine , isosorbide  mononitrate, metoprolol  succinate, ondansetron   --Please make sure you get dialysis tomorrow 01/25/24 at [facility] at 11:00AM. You will be following up with them every Monday, Wednesday, Friday for your dialysis sessions.   You should seek further medical care if you develop new symptoms such as fever, irritation/skin changes near your left fistula or right sided catheter placement, nausea, vomiting.   Please follow up with the following doctors/specialties: [facility] on 01/31/24 at 3:15PM   We recommend that you also see your primary care doctor in about a week to make sure that you continue to improve. We are so glad that you are feeling better.  Sincerely,  [facility]

## 2024-01-25 ENCOUNTER — Telehealth: Payer: Self-pay

## 2024-01-25 NOTE — Transitions of Care (Post Inpatient/ED Visit) (Signed)
" ° °  01/25/2024  Name: [name] MRN: [id number] DOB: [birth year]  Today's TOC FU Call Status: Today's TOC FU Call Status:: Unsuccessful Call (1st Attempt) Unsuccessful Call (1st Attempt) Date: 01/25/24  Attempted to reach the patient regarding the most recent Inpatient/ED visit.  Follow Up Plan: Additional outreach attempts will be made to reach the patient to complete the Transitions of Care (Post Inpatient/ED visit) call.   Medford Balboa, BSN, RN [place]  VBCI - Population Health RN Care Manager [phone]  "

## 2024-01-26 ENCOUNTER — Encounter (HOSPITAL_COMMUNITY)

## 2024-01-26 ENCOUNTER — Telehealth: Payer: Self-pay

## 2024-01-26 NOTE — Transitions of Care (Post Inpatient/ED Visit) (Signed)
" ° °  01/26/2024  Name: [name] MRN: [id number] DOB: 03-Nov-1980  Today's TOC FU Call Status: Today's TOC FU Call Status:: Successful TOC FU Call Completed TOC FU Call Complete Date: 01/26/24  Patient's Name and Date of Birth confirmed. Name, DOB  Transition Care Management Follow-up Telephone Call Date of Discharge: 01/24/24 Discharge Facility: [facility]) Type of Discharge: Inpatient Admission Primary Inpatient Discharge Diagnosis:: Cannabinoid Hyperemesis How have you been since you were released from the hospital?: Better Any questions or concerns?: No  Items Reviewed: Did you receive and understand the discharge instructions provided?: Yes Medications obtained,verified, and reconciled?: Yes (Medications Reviewed) Any new allergies since your discharge?: No Dietary orders reviewed?: NA Do you have support at home?: Yes People in Home [RPT]: child(ren), adult Name of Support/Comfort Primary Source: Norleen Dross  Medications Reviewed Today: Medications Reviewed Today     Reviewed by Moises Reusing, RN (Case Manager) on 01/26/24 at 1438  Med List Status: <None>   Medication Order Taking? Sig Documenting Provider Last Dose Status Informant  amitriptyline  (ELAVIL ) 10 MG tablet [id number]  Take 1 tablet (10 mg total) by mouth at bedtime. Syeda, Raeeha, DO  Active   amLODipine  (NORVASC ) 10 MG tablet [id number] No Take 1 tablet (10 mg total) by mouth daily.  Patient not taking: Reported on 01/19/2024   Amilibia, Jaden, DO Not Taking Active   calcitRIOL  (ROCALTROL ) 0.25 MCG capsule [id number]  Take 1 capsule (0.25 mcg total) by mouth every Monday, Wednesday, and Friday with hemodialysis. Syeda, Raeeha, DO  Active   calcium  carbonate (TUMS - DOSED IN MG ELEMENTAL CALCIUM ) 500 MG chewable tablet [id number]  Chew 2 tablets (400 mg of elemental calcium  total) by mouth 2 (two) times daily. Syeda, Raeeha, DO  Active   carvedilol  (COREG ) 25 MG tablet [id number]  Take 1 tablet (25 mg total) by  mouth 2 (two) times daily with a meal. Edgardo, Raeeha, DO  Active   pantoprazole  (PROTONIX ) 40 MG tablet [id number]  Take 1 tablet (40 mg total) by mouth daily. Edgardo Pontiff, DO  Active   sodium bicarbonate  650 MG tablet [id number] No Take 2 tablets (1,300 mg total) by mouth 2 (two) times daily.  Patient not taking: Reported on 01/19/2024   Edgardo Pontiff, DO Not Taking Active             Home Care and Equipment/Supplies: Were Home Health Services Ordered?: NA Any new equipment or medical supplies ordered?: NA  Functional Questionnaire: Do you need assistance with bathing/showering or dressing?: No Do you need assistance with meal preparation?: No Do you need assistance with eating?: No Do you have difficulty maintaining continence: No Do you need assistance with getting out of bed/getting out of a chair/moving?: No Do you have difficulty managing or taking your medications?: No  Follow up appointments reviewed: PCP Follow-up appointment confirmed?: Yes Date of PCP follow-up appointment?: 01/31/24 Follow-up Provider: [facility] Follow-up appointment confirmed?: NA Do you need transportation to your follow-up appointment?: No Do you understand care options if your condition(s) worsen?: Yes-patient verbalized understanding  SDOH Interventions Today    Flowsheet Row Most Recent Value  SDOH Interventions   Food Insecurity Interventions Intervention Not Indicated  Housing Interventions Intervention Not Indicated  Transportation Interventions Intervention Not Indicated  Utilities Interventions Intervention Not Indicated    Medford Moises, BSN, RN [place]  VBCI - Population Health RN Care Manager [phone]  "

## 2024-01-31 ENCOUNTER — Ambulatory Visit: Payer: Self-pay

## 2024-02-03 ENCOUNTER — Telehealth: Payer: Self-pay | Admitting: *Deleted

## 2024-02-03 NOTE — Telephone Encounter (Signed)
 Mammogram appointment  03-05-2024 @ 2:00 pm to arrive 1:15 pm breast center [phone]) appointment mailed to the patient attached the information regarding the 75.00 no show fee.

## 2024-02-16 ENCOUNTER — Encounter: Payer: Self-pay | Admitting: Student

## 2024-03-05 ENCOUNTER — Ambulatory Visit
